# Patient Record
Sex: Male | Born: 1935 | Race: White | Hispanic: No | Smoking: Never smoker
Health system: Southern US, Community
[De-identification: ages and names within clinical notes are randomized; demographics above are authoritative.]

## PROBLEM LIST (undated history)

## (undated) DIAGNOSIS — N400 Enlarged prostate without lower urinary tract symptoms: Secondary | ICD-10-CM

## (undated) DIAGNOSIS — M48061 Spinal stenosis, lumbar region without neurogenic claudication: Secondary | ICD-10-CM

## (undated) DIAGNOSIS — N183 Chronic kidney disease, stage 3 (moderate): Secondary | ICD-10-CM

## (undated) DIAGNOSIS — I359 Nonrheumatic aortic valve disorder, unspecified: Secondary | ICD-10-CM

## (undated) DIAGNOSIS — I5032 Chronic diastolic (congestive) heart failure: Secondary | ICD-10-CM

## (undated) DIAGNOSIS — I872 Venous insufficiency (chronic) (peripheral): Secondary | ICD-10-CM

## (undated) DIAGNOSIS — M519 Unspecified thoracic, thoracolumbar and lumbosacral intervertebral disc disorder: Secondary | ICD-10-CM

## (undated) DIAGNOSIS — F329 Major depressive disorder, single episode, unspecified: Secondary | ICD-10-CM

## (undated) DIAGNOSIS — I639 Cerebral infarction, unspecified: Secondary | ICD-10-CM

## (undated) DIAGNOSIS — K219 Gastro-esophageal reflux disease without esophagitis: Secondary | ICD-10-CM

## (undated) DIAGNOSIS — G459 Transient cerebral ischemic attack, unspecified: Secondary | ICD-10-CM

## (undated) DIAGNOSIS — I2510 Atherosclerotic heart disease of native coronary artery without angina pectoris: Secondary | ICD-10-CM

## (undated) DIAGNOSIS — Z8679 Personal history of other diseases of the circulatory system: Secondary | ICD-10-CM

## (undated) DIAGNOSIS — E669 Obesity, unspecified: Secondary | ICD-10-CM

## (undated) DIAGNOSIS — Z8739 Personal history of other diseases of the musculoskeletal system and connective tissue: Secondary | ICD-10-CM

## (undated) DIAGNOSIS — G4733 Obstructive sleep apnea (adult) (pediatric): Secondary | ICD-10-CM

## (undated) DIAGNOSIS — J9601 Acute respiratory failure with hypoxia: Secondary | ICD-10-CM

## (undated) DIAGNOSIS — I119 Hypertensive heart disease without heart failure: Secondary | ICD-10-CM

## (undated) DIAGNOSIS — Z789 Other specified health status: Secondary | ICD-10-CM

## (undated) DIAGNOSIS — G2581 Restless legs syndrome: Secondary | ICD-10-CM

## (undated) DIAGNOSIS — F32A Depression, unspecified: Secondary | ICD-10-CM

## (undated) DIAGNOSIS — I779 Disorder of arteries and arterioles, unspecified: Secondary | ICD-10-CM

## (undated) DIAGNOSIS — J189 Pneumonia, unspecified organism: Secondary | ICD-10-CM

## (undated) DIAGNOSIS — F419 Anxiety disorder, unspecified: Secondary | ICD-10-CM

## (undated) DIAGNOSIS — I739 Peripheral vascular disease, unspecified: Secondary | ICD-10-CM

## (undated) DIAGNOSIS — I219 Acute myocardial infarction, unspecified: Secondary | ICD-10-CM

## (undated) DIAGNOSIS — Z952 Presence of prosthetic heart valve: Secondary | ICD-10-CM

## (undated) DIAGNOSIS — I447 Left bundle-branch block, unspecified: Secondary | ICD-10-CM

## (undated) DIAGNOSIS — E1159 Type 2 diabetes mellitus with other circulatory complications: Secondary | ICD-10-CM

## (undated) DIAGNOSIS — M1990 Unspecified osteoarthritis, unspecified site: Secondary | ICD-10-CM

## (undated) DIAGNOSIS — Z9989 Dependence on other enabling machines and devices: Secondary | ICD-10-CM

## (undated) HISTORY — DX: Spinal stenosis, lumbar region without neurogenic claudication: M48.061

## (undated) HISTORY — DX: Cerebral infarction, unspecified: I63.9

## (undated) HISTORY — DX: Benign prostatic hyperplasia without lower urinary tract symptoms: N40.0

## (undated) HISTORY — DX: Chronic diastolic (congestive) heart failure: I50.32

## (undated) HISTORY — PX: RETINAL DETACHMENT SURGERY: SHX105

## (undated) HISTORY — PX: EYE SURGERY: SHX253

## (undated) HISTORY — PX: CATARACT EXTRACTION W/ INTRAOCULAR LENS IMPLANT: SHX1309

## (undated) HISTORY — PX: CATARACT EXTRACTION W/ INTRAOCULAR LENS  IMPLANT, BILATERAL: SHX1307

## (undated) HISTORY — DX: Left bundle-branch block, unspecified: I44.7

## (undated) HISTORY — DX: Gastro-esophageal reflux disease without esophagitis: K21.9

## (undated) HISTORY — PX: INGUINAL HERNIA REPAIR: SUR1180

## (undated) HISTORY — DX: Venous insufficiency (chronic) (peripheral): I87.2

## (undated) HISTORY — PX: COLONOSCOPY: SHX174

## (undated) HISTORY — DX: Transient cerebral ischemic attack, unspecified: G45.9

## (undated) HISTORY — PX: BACK SURGERY: SHX140

## (undated) HISTORY — PX: CARPAL TUNNEL RELEASE: SHX101

---

## 1969-03-24 HISTORY — PX: SHOULDER SURGERY: SHX246

## 1992-07-24 DIAGNOSIS — H3320 Serous retinal detachment, unspecified eye: Secondary | ICD-10-CM | POA: Insufficient documentation

## 1998-04-01 ENCOUNTER — Ambulatory Visit (HOSPITAL_COMMUNITY): Admission: RE | Admit: 1998-04-01 | Discharge: 1998-04-01 | Payer: Self-pay | Admitting: Family Medicine

## 2000-09-17 ENCOUNTER — Ambulatory Visit (HOSPITAL_COMMUNITY): Admission: RE | Admit: 2000-09-17 | Discharge: 2000-09-17 | Payer: Self-pay | Admitting: Emergency Medicine

## 2001-09-09 ENCOUNTER — Inpatient Hospital Stay (HOSPITAL_COMMUNITY): Admission: EM | Admit: 2001-09-09 | Discharge: 2001-09-11 | Payer: Self-pay | Admitting: Emergency Medicine

## 2001-09-09 ENCOUNTER — Encounter: Payer: Self-pay | Admitting: Emergency Medicine

## 2001-11-21 DIAGNOSIS — I639 Cerebral infarction, unspecified: Secondary | ICD-10-CM

## 2001-11-21 HISTORY — DX: Cerebral infarction, unspecified: I63.9

## 2002-07-24 DIAGNOSIS — I219 Acute myocardial infarction, unspecified: Secondary | ICD-10-CM | POA: Insufficient documentation

## 2002-09-01 ENCOUNTER — Encounter: Admission: RE | Admit: 2002-09-01 | Discharge: 2002-09-01 | Payer: Self-pay | Admitting: Emergency Medicine

## 2002-09-01 ENCOUNTER — Encounter: Payer: Self-pay | Admitting: Emergency Medicine

## 2002-09-10 ENCOUNTER — Encounter: Payer: Self-pay | Admitting: Emergency Medicine

## 2002-09-10 ENCOUNTER — Encounter: Admission: RE | Admit: 2002-09-10 | Discharge: 2002-09-10 | Payer: Self-pay | Admitting: Emergency Medicine

## 2003-01-05 ENCOUNTER — Encounter: Payer: Self-pay | Admitting: Internal Medicine

## 2003-01-05 ENCOUNTER — Encounter: Admission: RE | Admit: 2003-01-05 | Discharge: 2003-01-05 | Payer: Self-pay | Admitting: Internal Medicine

## 2003-02-03 ENCOUNTER — Encounter: Admission: RE | Admit: 2003-02-03 | Discharge: 2003-03-05 | Payer: Self-pay | Admitting: Orthopedic Surgery

## 2003-07-25 DIAGNOSIS — G459 Transient cerebral ischemic attack, unspecified: Secondary | ICD-10-CM

## 2003-07-25 HISTORY — DX: Transient cerebral ischemic attack, unspecified: G45.9

## 2004-10-17 ENCOUNTER — Encounter: Admission: RE | Admit: 2004-10-17 | Discharge: 2004-10-17 | Payer: Self-pay | Admitting: Emergency Medicine

## 2004-11-18 ENCOUNTER — Encounter: Admission: RE | Admit: 2004-11-18 | Discharge: 2004-11-18 | Payer: Self-pay | Admitting: Emergency Medicine

## 2004-11-23 ENCOUNTER — Encounter: Admission: RE | Admit: 2004-11-23 | Discharge: 2004-11-23 | Payer: Self-pay | Admitting: Emergency Medicine

## 2004-11-27 ENCOUNTER — Encounter: Admission: RE | Admit: 2004-11-27 | Discharge: 2004-11-27 | Payer: Self-pay | Admitting: Emergency Medicine

## 2004-12-03 ENCOUNTER — Encounter: Payer: Self-pay | Admitting: Pulmonary Disease

## 2004-12-06 ENCOUNTER — Encounter (INDEPENDENT_AMBULATORY_CARE_PROVIDER_SITE_OTHER): Payer: Self-pay | Admitting: *Deleted

## 2004-12-06 ENCOUNTER — Inpatient Hospital Stay (HOSPITAL_COMMUNITY): Admission: RE | Admit: 2004-12-06 | Discharge: 2004-12-07 | Payer: Self-pay | Admitting: Vascular Surgery

## 2005-01-03 ENCOUNTER — Ambulatory Visit (HOSPITAL_BASED_OUTPATIENT_CLINIC_OR_DEPARTMENT_OTHER): Admission: RE | Admit: 2005-01-03 | Discharge: 2005-01-03 | Payer: Self-pay | Admitting: Emergency Medicine

## 2005-01-03 ENCOUNTER — Ambulatory Visit: Payer: Self-pay | Admitting: Internal Medicine

## 2005-07-24 DIAGNOSIS — Z8679 Personal history of other diseases of the circulatory system: Secondary | ICD-10-CM

## 2005-07-24 HISTORY — DX: Personal history of other diseases of the circulatory system: Z86.79

## 2005-07-24 HISTORY — PX: PERICARDIAL WINDOW: SHX2213

## 2005-12-06 HISTORY — PX: CAROTID ENDARTERECTOMY: SUR193

## 2006-05-25 ENCOUNTER — Encounter: Admission: RE | Admit: 2006-05-25 | Discharge: 2006-05-25 | Payer: Self-pay | Admitting: Emergency Medicine

## 2006-05-28 ENCOUNTER — Encounter (INDEPENDENT_AMBULATORY_CARE_PROVIDER_SITE_OTHER): Payer: Self-pay | Admitting: Cardiology

## 2006-05-28 ENCOUNTER — Inpatient Hospital Stay (HOSPITAL_COMMUNITY): Admission: EM | Admit: 2006-05-28 | Discharge: 2006-06-02 | Payer: Self-pay | Admitting: Emergency Medicine

## 2006-05-29 ENCOUNTER — Encounter (INDEPENDENT_AMBULATORY_CARE_PROVIDER_SITE_OTHER): Payer: Self-pay | Admitting: Specialist

## 2006-05-29 DIAGNOSIS — Z9889 Other specified postprocedural states: Secondary | ICD-10-CM | POA: Insufficient documentation

## 2006-05-31 ENCOUNTER — Ambulatory Visit: Payer: Self-pay | Admitting: Infectious Diseases

## 2006-06-08 ENCOUNTER — Encounter: Admission: RE | Admit: 2006-06-08 | Discharge: 2006-06-08 | Payer: Self-pay | Admitting: Cardiothoracic Surgery

## 2006-06-13 ENCOUNTER — Ambulatory Visit: Payer: Self-pay | Admitting: Infectious Diseases

## 2006-06-20 ENCOUNTER — Ambulatory Visit: Payer: Self-pay | Admitting: Infectious Diseases

## 2006-06-25 ENCOUNTER — Encounter (INDEPENDENT_AMBULATORY_CARE_PROVIDER_SITE_OTHER): Payer: Self-pay | Admitting: Infectious Diseases

## 2006-07-09 ENCOUNTER — Ambulatory Visit: Payer: Self-pay | Admitting: Infectious Diseases

## 2006-07-24 HISTORY — PX: SHOULDER ARTHROSCOPY: SHX128

## 2006-07-30 DIAGNOSIS — H269 Unspecified cataract: Secondary | ICD-10-CM

## 2006-07-30 DIAGNOSIS — Z8673 Personal history of transient ischemic attack (TIA), and cerebral infarction without residual deficits: Secondary | ICD-10-CM | POA: Insufficient documentation

## 2006-08-13 ENCOUNTER — Encounter: Payer: Self-pay | Admitting: Infectious Diseases

## 2006-09-14 DIAGNOSIS — I739 Peripheral vascular disease, unspecified: Secondary | ICD-10-CM

## 2006-09-14 DIAGNOSIS — E785 Hyperlipidemia, unspecified: Secondary | ICD-10-CM | POA: Insufficient documentation

## 2006-09-14 DIAGNOSIS — G2581 Restless legs syndrome: Secondary | ICD-10-CM | POA: Insufficient documentation

## 2006-09-14 DIAGNOSIS — M1990 Unspecified osteoarthritis, unspecified site: Secondary | ICD-10-CM | POA: Insufficient documentation

## 2006-09-14 DIAGNOSIS — I119 Hypertensive heart disease without heart failure: Secondary | ICD-10-CM | POA: Insufficient documentation

## 2007-07-01 ENCOUNTER — Ambulatory Visit: Payer: Self-pay | Admitting: Vascular Surgery

## 2007-09-27 ENCOUNTER — Inpatient Hospital Stay (HOSPITAL_COMMUNITY): Admission: RE | Admit: 2007-09-27 | Discharge: 2007-09-28 | Payer: Self-pay | Admitting: Orthopaedic Surgery

## 2007-11-29 ENCOUNTER — Ambulatory Visit (HOSPITAL_BASED_OUTPATIENT_CLINIC_OR_DEPARTMENT_OTHER): Admission: RE | Admit: 2007-11-29 | Discharge: 2007-11-29 | Payer: Self-pay | Admitting: Orthopedic Surgery

## 2008-09-18 ENCOUNTER — Ambulatory Visit: Payer: Self-pay | Admitting: Vascular Surgery

## 2009-01-21 HISTORY — PX: CORONARY ANGIOPLASTY WITH STENT PLACEMENT: SHX49

## 2009-02-08 ENCOUNTER — Encounter: Admission: RE | Admit: 2009-02-08 | Discharge: 2009-02-08 | Payer: Self-pay | Admitting: Cardiology

## 2009-02-11 ENCOUNTER — Ambulatory Visit: Payer: Self-pay | Admitting: Cardiovascular Disease

## 2009-02-11 ENCOUNTER — Inpatient Hospital Stay (HOSPITAL_COMMUNITY): Admission: RE | Admit: 2009-02-11 | Discharge: 2009-02-12 | Payer: Self-pay | Admitting: Cardiology

## 2009-02-12 ENCOUNTER — Encounter: Payer: Self-pay | Admitting: Internal Medicine

## 2009-02-12 LAB — CONVERTED CEMR LAB
BUN: 27 mg/dL
CO2: 26 meq/L
Chloride: 106 meq/L
Creatinine, Ser: 1.25 mg/dL
Glucose, Bld: 98 mg/dL
MCV: 90.2 fL
Platelets: 150 10*3/uL
Potassium: 5.7 meq/L
RDW: 13.9 %
Sodium: 139 meq/L

## 2009-08-26 ENCOUNTER — Ambulatory Visit: Payer: Self-pay | Admitting: Vascular Surgery

## 2009-08-27 ENCOUNTER — Encounter: Payer: Self-pay | Admitting: Internal Medicine

## 2009-08-27 DIAGNOSIS — E669 Obesity, unspecified: Secondary | ICD-10-CM | POA: Insufficient documentation

## 2009-08-27 DIAGNOSIS — M1A9XX1 Chronic gout, unspecified, with tophus (tophi): Secondary | ICD-10-CM

## 2009-08-30 ENCOUNTER — Ambulatory Visit: Payer: Self-pay | Admitting: Internal Medicine

## 2009-08-30 DIAGNOSIS — K219 Gastro-esophageal reflux disease without esophagitis: Secondary | ICD-10-CM

## 2009-08-30 DIAGNOSIS — Z8673 Personal history of transient ischemic attack (TIA), and cerebral infarction without residual deficits: Secondary | ICD-10-CM

## 2009-08-30 LAB — CONVERTED CEMR LAB
Basophils Absolute: 0 10*3/uL (ref 0.0–0.1)
Basophils Relative: 0.4 % (ref 0.0–3.0)
CO2: 33 meq/L — ABNORMAL HIGH (ref 19–32)
Calcium: 9.5 mg/dL (ref 8.4–10.5)
Chloride: 103 meq/L (ref 96–112)
Eosinophils Absolute: 0.3 10*3/uL (ref 0.0–0.7)
Eosinophils Relative: 3.5 % (ref 0.0–5.0)
Lymphocytes Relative: 17.7 % (ref 12.0–46.0)
Lymphs Abs: 1.5 10*3/uL (ref 0.7–4.0)
Monocytes Absolute: 1 10*3/uL (ref 0.1–1.0)
Neutro Abs: 5.6 10*3/uL (ref 1.4–7.7)
Potassium: 4.7 meq/L (ref 3.5–5.1)
RBC: 4.88 M/uL (ref 4.22–5.81)
Sodium: 142 meq/L (ref 135–145)

## 2009-09-07 ENCOUNTER — Encounter: Payer: Self-pay | Admitting: Internal Medicine

## 2009-09-16 ENCOUNTER — Ambulatory Visit: Payer: Self-pay | Admitting: Internal Medicine

## 2009-09-23 ENCOUNTER — Telehealth: Payer: Self-pay | Admitting: Internal Medicine

## 2009-09-27 ENCOUNTER — Telehealth: Payer: Self-pay | Admitting: Internal Medicine

## 2009-09-29 ENCOUNTER — Ambulatory Visit: Payer: Self-pay | Admitting: Internal Medicine

## 2009-09-29 DIAGNOSIS — N3941 Urge incontinence: Secondary | ICD-10-CM

## 2009-10-05 ENCOUNTER — Ambulatory Visit: Payer: Self-pay | Admitting: Internal Medicine

## 2009-11-01 ENCOUNTER — Ambulatory Visit: Payer: Self-pay | Admitting: Internal Medicine

## 2009-11-01 LAB — CONVERTED CEMR LAB
AST: 32 units/L (ref 0–37)
Albumin: 4.1 g/dL (ref 3.5–5.2)
Alkaline Phosphatase: 59 units/L (ref 39–117)
BUN: 43 mg/dL — ABNORMAL HIGH (ref 6–23)
Basophils Relative: 0.4 % (ref 0.0–3.0)
CO2: 30 meq/L (ref 19–32)
Calcium: 9.5 mg/dL (ref 8.4–10.5)
Cholesterol: 224 mg/dL — ABNORMAL HIGH (ref 0–200)
Creatinine, Ser: 1.5 mg/dL (ref 0.4–1.5)
Direct LDL: 141.5 mg/dL
Eosinophils Relative: 3.5 % (ref 0.0–5.0)
Hemoglobin: 15 g/dL (ref 13.0–17.0)
Lymphocytes Relative: 28 % (ref 12.0–46.0)
Monocytes Relative: 10.7 % (ref 3.0–12.0)
Neutro Abs: 3.3 10*3/uL (ref 1.4–7.7)
RBC: 4.98 M/uL (ref 4.22–5.81)
Total Protein: 7.7 g/dL (ref 6.0–8.3)
Triglycerides: 249 mg/dL — ABNORMAL HIGH (ref 0.0–149.0)
WBC: 5.7 10*3/uL (ref 4.5–10.5)

## 2009-11-02 ENCOUNTER — Encounter (INDEPENDENT_AMBULATORY_CARE_PROVIDER_SITE_OTHER): Payer: Self-pay | Admitting: *Deleted

## 2009-11-15 ENCOUNTER — Inpatient Hospital Stay (HOSPITAL_COMMUNITY): Admission: EM | Admit: 2009-11-15 | Discharge: 2009-11-16 | Payer: Self-pay | Admitting: Emergency Medicine

## 2009-11-15 ENCOUNTER — Encounter (INDEPENDENT_AMBULATORY_CARE_PROVIDER_SITE_OTHER): Payer: Self-pay | Admitting: Internal Medicine

## 2009-11-16 LAB — CONVERTED CEMR LAB
BUN: 23 mg/dL
CO2: 23 meq/L
Calcium: 9.3 mg/dL
Calcium: 9.3 mg/dL
Chloride: 108 meq/L
Eosinophils Relative: 2 %
HCT: 44.1 %
Lymphocytes, automated: 30 %
MCV: 89.5 fL
Monocytes Relative: 13 %
Neutrophils Relative %: 55 %
Potassium: 5.1 meq/L
RDW: 16.1 %
Sodium: 136 meq/L
Sodium: 137 meq/L

## 2009-11-17 ENCOUNTER — Ambulatory Visit: Payer: Self-pay | Admitting: Internal Medicine

## 2009-11-24 ENCOUNTER — Ambulatory Visit: Payer: Self-pay | Admitting: Internal Medicine

## 2009-11-24 LAB — CONVERTED CEMR LAB
AST: 48 units/L — ABNORMAL HIGH (ref 0–37)
Albumin: 3.9 g/dL (ref 3.5–5.2)
Alkaline Phosphatase: 67 units/L (ref 39–117)
BUN: 29 mg/dL — ABNORMAL HIGH (ref 6–23)
Basophils Absolute: 0 10*3/uL (ref 0.0–0.1)
Bilirubin Urine: NEGATIVE
CO2: 30 meq/L (ref 19–32)
Chloride: 104 meq/L (ref 96–112)
Eosinophils Relative: 9 % — ABNORMAL HIGH (ref 0.0–5.0)
GFR calc non Af Amer: 57.23 mL/min (ref >60)
Glucose, Bld: 129 mg/dL — ABNORMAL HIGH (ref 70–99)
HCT: 43.2 % (ref 39.0–52.0)
Hemoglobin: 14.9 g/dL (ref 13.0–17.0)
Ketones, ur: NEGATIVE mg/dL
Lymphs Abs: 1.1 10*3/uL (ref 0.7–4.0)
MCV: 88.6 fL (ref 78.0–100.0)
Monocytes Absolute: 0.5 10*3/uL (ref 0.1–1.0)
Monocytes Relative: 9.9 % (ref 3.0–12.0)
Neutro Abs: 3.2 10*3/uL (ref 1.4–7.7)
Platelets: 148 10*3/uL — ABNORMAL LOW (ref 150.0–400.0)
Potassium: 4.5 meq/L (ref 3.5–5.1)
RDW: 16.3 % — ABNORMAL HIGH (ref 11.5–14.6)
Sed Rate: 9 mm/hr (ref 0–22)
Sodium: 142 meq/L (ref 135–145)
Total Protein: 6.6 g/dL (ref 6.0–8.3)
Urine Glucose: NEGATIVE mg/dL
Urobilinogen, UA: 0.2 (ref 0.0–1.0)

## 2009-11-30 ENCOUNTER — Encounter (INDEPENDENT_AMBULATORY_CARE_PROVIDER_SITE_OTHER): Payer: Self-pay | Admitting: *Deleted

## 2009-11-30 ENCOUNTER — Ambulatory Visit: Payer: Self-pay | Admitting: Gastroenterology

## 2009-11-30 LAB — CONVERTED CEMR LAB: Vitamin B-12: 1183 pg/mL — ABNORMAL HIGH (ref 211–911)

## 2009-12-10 ENCOUNTER — Ambulatory Visit: Payer: Self-pay | Admitting: Gastroenterology

## 2009-12-10 DIAGNOSIS — K2970 Gastritis, unspecified, without bleeding: Secondary | ICD-10-CM | POA: Insufficient documentation

## 2009-12-10 DIAGNOSIS — K2990 Gastroduodenitis, unspecified, without bleeding: Secondary | ICD-10-CM

## 2009-12-10 LAB — CONVERTED CEMR LAB: UREASE: NEGATIVE

## 2009-12-24 ENCOUNTER — Telehealth: Payer: Self-pay | Admitting: Endocrinology

## 2009-12-24 ENCOUNTER — Encounter: Payer: Self-pay | Admitting: Internal Medicine

## 2010-01-05 ENCOUNTER — Ambulatory Visit: Payer: Self-pay | Admitting: Internal Medicine

## 2010-01-07 ENCOUNTER — Telehealth: Payer: Self-pay | Admitting: Internal Medicine

## 2010-01-27 ENCOUNTER — Ambulatory Visit: Payer: Self-pay | Admitting: Internal Medicine

## 2010-04-11 ENCOUNTER — Encounter: Admission: RE | Admit: 2010-04-11 | Discharge: 2010-04-11 | Payer: Self-pay | Admitting: Cardiology

## 2010-04-13 ENCOUNTER — Ambulatory Visit: Payer: Self-pay | Admitting: Cardiology

## 2010-04-13 ENCOUNTER — Encounter: Payer: Self-pay | Admitting: Internal Medicine

## 2010-04-13 LAB — CONVERTED CEMR LAB
Albumin: 3.8 g/dL
Alkaline Phosphatase: 68 units/L
BUN: 27 mg/dL
CO2: 27 meq/L
Chloride: 103 meq/L
Cholesterol: 187 mg/dL
Creatinine, Ser: 2.12 mg/dL
HCT: 44.4 %
HDL: 41 mg/dL
Hemoglobin: 14.8 g/dL
Neutrophils Relative %: 92 %
Platelets: 162 10*3/uL
RDW: 14.9 %
Sodium: 140 meq/L
Total Protein: 7.3 g/dL
Triglyceride fasting, serum: 154 mg/dL
WBC: 8.1 10*3/uL

## 2010-04-14 ENCOUNTER — Encounter (INDEPENDENT_AMBULATORY_CARE_PROVIDER_SITE_OTHER): Payer: Self-pay | Admitting: Cardiology

## 2010-04-14 ENCOUNTER — Inpatient Hospital Stay (HOSPITAL_COMMUNITY): Admission: EM | Admit: 2010-04-14 | Discharge: 2010-04-17 | Payer: Self-pay | Admitting: Emergency Medicine

## 2010-04-14 ENCOUNTER — Ambulatory Visit: Payer: Self-pay | Admitting: Cardiology

## 2010-04-15 ENCOUNTER — Encounter: Payer: Self-pay | Admitting: Internal Medicine

## 2010-04-16 ENCOUNTER — Encounter: Payer: Self-pay | Admitting: Internal Medicine

## 2010-04-16 LAB — CONVERTED CEMR LAB
CO2: 31 meq/L
Calcium: 8.9 mg/dL
Chloride: 104 meq/L
Creatinine, Ser: 1.24 mg/dL
Hgb A1c MFr Bld: 6.2 %
MCV: 95.3 fL
Potassium: 4.8 meq/L
RDW: 15.4 %
Sodium: 140 meq/L

## 2010-04-17 ENCOUNTER — Encounter: Payer: Self-pay | Admitting: Internal Medicine

## 2010-04-17 LAB — CONVERTED CEMR LAB
Creatinine, Ser: 1.18 mg/dL
Glucose, Bld: 118 mg/dL
Potassium: 3.9 meq/L
Sodium: 137 meq/L

## 2010-04-18 ENCOUNTER — Encounter: Payer: Self-pay | Admitting: Internal Medicine

## 2010-04-19 ENCOUNTER — Ambulatory Visit: Payer: Self-pay | Admitting: Internal Medicine

## 2010-04-19 DIAGNOSIS — E1159 Type 2 diabetes mellitus with other circulatory complications: Secondary | ICD-10-CM | POA: Insufficient documentation

## 2010-05-06 ENCOUNTER — Ambulatory Visit: Payer: Self-pay | Admitting: Internal Medicine

## 2010-05-06 DIAGNOSIS — I509 Heart failure, unspecified: Secondary | ICD-10-CM | POA: Insufficient documentation

## 2010-05-06 LAB — CONVERTED CEMR LAB
BUN: 26 mg/dL — ABNORMAL HIGH (ref 6–23)
Basophils Absolute: 0 10*3/uL (ref 0.0–0.1)
Basophils Relative: 0.3 % (ref 0.0–3.0)
Calcium: 9.7 mg/dL (ref 8.4–10.5)
Chloride: 103 meq/L (ref 96–112)
Creatinine, Ser: 1.5 mg/dL (ref 0.4–1.5)
Eosinophils Absolute: 0.4 10*3/uL (ref 0.0–0.7)
GFR calc non Af Amer: 47.72 mL/min (ref >60)
Hemoglobin: 14.6 g/dL (ref 13.0–17.0)
Lymphocytes Relative: 34 % (ref 12.0–46.0)
Lymphs Abs: 2.2 10*3/uL (ref 0.7–4.0)
MCHC: 33.6 g/dL (ref 30.0–36.0)
MCV: 93 fL (ref 78.0–100.0)
Monocytes Absolute: 0.9 10*3/uL (ref 0.1–1.0)
Neutro Abs: 3 10*3/uL (ref 1.4–7.7)
Pro B Natriuretic peptide (BNP): 83.6 pg/mL (ref 0.0–100.0)
RDW: 15.9 % — ABNORMAL HIGH (ref 11.5–14.6)

## 2010-05-09 ENCOUNTER — Ambulatory Visit: Payer: Self-pay | Admitting: Internal Medicine

## 2010-05-11 ENCOUNTER — Ambulatory Visit: Payer: Self-pay | Admitting: Internal Medicine

## 2010-05-11 ENCOUNTER — Telehealth: Payer: Self-pay | Admitting: Internal Medicine

## 2010-05-16 ENCOUNTER — Ambulatory Visit: Payer: Self-pay | Admitting: Internal Medicine

## 2010-05-20 ENCOUNTER — Encounter: Payer: Self-pay | Admitting: Internal Medicine

## 2010-05-23 ENCOUNTER — Telehealth (INDEPENDENT_AMBULATORY_CARE_PROVIDER_SITE_OTHER): Payer: Self-pay | Admitting: *Deleted

## 2010-05-24 ENCOUNTER — Encounter: Payer: Self-pay | Admitting: Internal Medicine

## 2010-06-09 ENCOUNTER — Encounter (HOSPITAL_COMMUNITY)
Admission: RE | Admit: 2010-06-09 | Discharge: 2010-07-23 | Payer: Self-pay | Source: Home / Self Care | Attending: Cardiology | Admitting: Cardiology

## 2010-06-20 ENCOUNTER — Encounter: Payer: Self-pay | Admitting: Internal Medicine

## 2010-06-27 ENCOUNTER — Ambulatory Visit: Payer: Self-pay | Admitting: Internal Medicine

## 2010-06-27 DIAGNOSIS — F411 Generalized anxiety disorder: Secondary | ICD-10-CM

## 2010-07-07 ENCOUNTER — Telehealth: Payer: Self-pay | Admitting: Internal Medicine

## 2010-07-21 ENCOUNTER — Telehealth: Payer: Self-pay | Admitting: Internal Medicine

## 2010-07-26 ENCOUNTER — Encounter: Payer: Self-pay | Admitting: Internal Medicine

## 2010-08-08 ENCOUNTER — Ambulatory Visit
Admission: RE | Admit: 2010-08-08 | Discharge: 2010-08-08 | Payer: Self-pay | Source: Home / Self Care | Attending: Internal Medicine | Admitting: Internal Medicine

## 2010-08-08 ENCOUNTER — Encounter: Payer: Self-pay | Admitting: Internal Medicine

## 2010-08-08 ENCOUNTER — Telehealth: Payer: Self-pay | Admitting: Internal Medicine

## 2010-08-08 DIAGNOSIS — G4700 Insomnia, unspecified: Secondary | ICD-10-CM | POA: Insufficient documentation

## 2010-08-08 DIAGNOSIS — F329 Major depressive disorder, single episode, unspecified: Secondary | ICD-10-CM | POA: Insufficient documentation

## 2010-08-08 DIAGNOSIS — F3289 Other specified depressive episodes: Secondary | ICD-10-CM | POA: Insufficient documentation

## 2010-08-14 ENCOUNTER — Encounter: Payer: Self-pay | Admitting: Infectious Diseases

## 2010-08-21 LAB — CONVERTED CEMR LAB
ALT: 15 units/L
AST: 25 units/L
Alkaline Phosphatase: 86 units/L
BUN: 78 mg/dL
Calcium: 9.9 mg/dL
Glucose, Bld: 139 mg/dL
Lymphocytes, automated: 16 %
MCV: 93.1 fL
Monocytes Relative: 11 %
Neutrophils Relative %: 71 %
Platelets: 226 10*3/uL
Potassium: 4.1 meq/L
Sodium: 139 meq/L
Total Protein: 7.7 g/dL
WBC: 11.1 10*3/uL

## 2010-08-23 ENCOUNTER — Ambulatory Visit
Admission: RE | Admit: 2010-08-23 | Discharge: 2010-08-23 | Payer: Self-pay | Source: Home / Self Care | Attending: Pulmonary Disease | Admitting: Pulmonary Disease

## 2010-08-23 DIAGNOSIS — G4733 Obstructive sleep apnea (adult) (pediatric): Secondary | ICD-10-CM | POA: Insufficient documentation

## 2010-08-23 NOTE — Assessment & Plan Note (Signed)
Summary: NEW / BLUE MEDICARE / # / CD   Vital Signs:  Patient profile:   77 year old male Height:      67 inches (170.18 cm) Weight:      249.6 pounds (113.45 kg) BMI:     39.23 O2 Sat:      97 % on Room air Temp:     97.4 degrees F (36.33 degrees C) oral Pulse rate:   71 / minute BP sitting:   148 / 68  (left arm) Cuff size:   large  Vitals Entered By: Orlan Leavens (August 30, 2009 9:46 AM)  O2 Flow:  Room air CC: New patient Is Patient Diabetic? No Pain Assessment Patient in pain? yes     Location: feet Type: gout flare-up   Primary Care Provider:  Newt Lukes MD  CC:  New patient.  History of Present Illness: new pt to me - here to est care   1) concerned about red lump on top of left foot onset of redness was 4 days ago reports the "lump of bone" was already at this site but prior to 4 d ago was not swollen, red or uncomfortable denies a/w fever -no flares of gout and not like prior gout symptoms  denies any trauma or injury to this area no drainage at any time from this lump no other joints or skin affected  2) htn reports compliance with ongoing medical treatment and no changes in medication dose or frequency. denies adverse side effects related to current therapy.   3) dyslipidemia intol of lipitor in 2003 but reports compliance with ongoing medical treatment and no changes in medication dose or frequency. denies adverse side effects related to current therapy.   4) gout with tophi follows with rheum for same. no recent flares or swelling. no med changes  Preventive Screening-Counseling & Management  Alcohol-Tobacco     Alcohol drinks/day: 0     Smoking Status: never  Safety-Violence-Falls     Firearms in the Home: firearms in the home     Firearm Counseling: not indicated; uses recommended firearm safety measures     Smoke Detectors: yes     Violence in the Home: no risk noted     Fall Risk Counseling: not indicated; no significant falls  noted  Clinical Review Panels:  Immunizations   Last Tetanus Booster:  Historical (07/25/2003)   Last Pneumovax:  Historical (07/24/2005)  CBC   WBC:  6.1 (02/12/2009)   RBC:  4.82 (02/12/2009)   Hgb:  14.7 (02/12/2009)   Hct:  43.4 (02/12/2009)   Platelets:  150 (02/12/2009)   MCV  90.2 (02/12/2009)   RDW  13.9 (02/12/2009)  Complete Metabolic Panel   Glucose:  98 (02/12/2009)   Sodium:  139 (02/12/2009)   Potassium:  5.7 (02/12/2009)   Chloride:  106 (02/12/2009)   CO2:  26 (02/12/2009)   BUN:  27 (02/12/2009)   Creatinine:  1.25 (02/12/2009)   Calcium:  9.3 (02/12/2009)   Current Medications (verified): 1)  Metoprolol Tartrate 50 Mg Tabs (Metoprolol Tartrate) .... Take 1 Tablet By Mouth Two Times A Day 2)  Lorazepam  Tabs (Lorazepam Tabs) .... Take 1 Tablet By Mouth Two Times A Day 3)  Requip 4 Mg Tabs (Ropinirole Hydrochloride) .... Take 1 Tablet By Mouth Once A Day 4)  Remeron 15 Mg Tabs (Mirtazapine) .... At Bedtime 5)  Colchicine 0.6 Mg Tabs (Colchicine) .... Take 1 Tablet By Mouth Two Times A Day 6)  Aspirin 81 Mg Tbec (Aspirin) .... Once Daily 7)  Plavix 75 Mg Tabs (Clopidogrel Bisulfate) .... Take 1 By Mouth Qd 8)  Furosemide 20 Mg Tabs (Furosemide) .... Take 1 Two Times A Day 9)  Gabapentin 100 Mg Caps (Gabapentin) .... Take 1 Two Times A Day 10)  Coreg 12.5 Mg Tabs (Carvedilol) .... Take 1 Two Times A Day 11)  Allopurinol 100 Mg Tabs (Allopurinol) .... Take 1 Two Times A Day 12)  Lisinopril 40 Mg Tabs (Lisinopril) .... Take 1 By Mouth Qd 13)  Flurazepam Hcl 30 Mg Caps (Flurazepam Hcl) .... Take 1 By Mouth Qd 14)  Simvastatin 40 Mg Tabs (Simvastatin) .... Take 1 By Mouth Qd 15)  Vitamin D3 1000 Unit Tabs (Cholecalciferol) .... Take 1 By Mouth Qd  Allergies (verified): No Known Drug Allergies  Past History:  Past Medical History: Pericarditis MSSA s/ p pericardia window 2007 Hypertension Hyperlipidemia Arthritis-gouty Degenerative disc disease History  of Transient Ischemic attack-2005 History of Left Carotid Endarterectomy Myocardial Infarction-2004 Mild sleep apnea Restless Leg Syndrome Retinal detachment-1994 Cataracts Coronary artery disease GERD Urinary incontinence  MD rooster: cards- [facility] - early rheum- deveshwrar  Past Surgical History: PTCA/stent (01/2009) pericardial window (2007) Nerve in arm (2008)  Family History: Family History Diabetes 1st degree relative (parent) Family History of Prostate CA 1st degree relative <50 (other relative) Heart disease (other relative)  Social History: Never Smoked lives with wife (who is paraplegic) and provides her 24/7 care with asst of dtrsSmoking Status:  never  Review of Systems       see HPI above. I have reviewed all other systems and they were negative.   Physical Exam  General:  alert, well-developed, well-nourished, and cooperative to examination.    Eyes:  vision grossly intact; pupils equal, round and reactive to light.  conjunctiva and lids normal.    Neck:  thick supple, full ROM, no masses, no thyromegaly; no thyroid nodules or tenderness. no JVD or carotid bruits.   Lungs:  normal respiratory effort, no intercostal retractions or use of accessory muscles; normal breath sounds bilaterally - no crackles and no wheezes.    Heart:  normal rate, regular rhythm, no murmur, and no rub. BLE with 1+ edema. L>R, normal DP pulses and normal cap refill in all 4 extremities    Abdomen:  protuberant , soft, non-tender, normal bowel sounds, no distention; no masses and no appreciable hepatomegaly or splenomegaly.   Msk:  tophi changes at bilateral knees R>L, no gross defomities or effusions Neurologic:  alert & oriented X3 and cranial nerves II-XII symetrically intact.  strength normal in all extremities, sensation intact to light touch, and gait normal. speech fluent without dysarthria or aphasia; follows commands with good comprehension.  Skin:  1" nodular tophi with  3" surrounding erythema on dorsum of left foot over medial side between 1-2 tarsal region. other wise no rashes, vesicles, ulcers, or erythema. Psych:  Oriented X3, memory intact for recent and remote, normally interactive, good eye contact, not anxious appearing, not depressed appearing, and not agitated.      Impression & Recommendations:  Problem # 1:  CELLULITIS, FOOT, LEFT (ICD-682.7)  will not i&d given hx tophi in this location prior to onset of redness - but check xray now and start kelfex  pt instructed to cal for reval if increasing redness, fever, swelling or other problems despite abx - cont other chroinc meds for gout as ongoing -  Elevate affected area. Warm moist compresses for 20 minutes  every 2 hours while awake. Take antibiotics as directed and take acetaminophen as needed. To be seen in 48-72 hours if no improvement, sooner if worse.  His updated medication list for this problem includes:    Keflex 500 Mg Caps (Cephalexin) .Marland Kitchen... 1 by mouth three times a day x 7 days  Orders: T-Foot Left Min 3 Views (73630TC) TLB-CBC Platelet - w/Differential (85025-CBCD) Prescription Created Electronically [phone])  Problem # 2:  GOUTY TOPHI (ICD-274.82)  His updated medication list for this problem includes:    Colchicine 0.6 Mg Tabs (Colchicine) .Marland Kitchen... Take 1 tablet by mouth two times a day    Allopurinol 100 Mg Tabs (Allopurinol) .Marland Kitchen... Take 1 two times a day  Elevate extremity; warm compresses, symptomatic relief and medication as directed.   Orders: T-Foot Left Min 3 Views [phone])  Problem # 3:  CORONARY ARTERY DISEASE (ICD-414.00) DES stent to cx done 01/2009- hosp records reviewed no current symptoms of angina cont med mgmt as ongoing per [facility]ley The following medications were removed from the medication list:    Doxazosin Mesylate 2 Mg Tabs (Doxazosin mesylate) ..... Once daily    Bayer Aspirin 325 Mg Tabs (Aspirin) .Marland Kitchen... Take 1 tablet by mouth once a day His updated  medication list for this problem includes:    Metoprolol Tartrate 50 Mg Tabs (Metoprolol tartrate) .Marland Kitchen... Take 1 tablet by mouth two times a day    Aspirin 81 Mg Tbec (Aspirin) ..... Once daily    Plavix 75 Mg Tabs (Clopidogrel bisulfate) .Marland Kitchen... Take 1 by mouth qd    Furosemide 20 Mg Tabs (Furosemide) .Marland Kitchen... Take 1 two times a day    Coreg 12.5 Mg Tabs (Carvedilol) .Marland Kitchen... Take 1 two times a day    Lisinopril 40 Mg Tabs (Lisinopril) .Marland Kitchen... Take 1 by mouth qd  Problem # 4:  HYPERTENSION (ICD-401.9)  The following medications were removed from the medication list:    Doxazosin Mesylate 2 Mg Tabs (Doxazosin mesylate) ..... Once daily His updated medication list for this problem includes:    Metoprolol Tartrate 50 Mg Tabs (Metoprolol tartrate) .Marland Kitchen... Take 1 tablet by mouth two times a day    Furosemide 20 Mg Tabs (Furosemide) .Marland Kitchen... Take 1 two times a day    Coreg 12.5 Mg Tabs (Carvedilol) .Marland Kitchen... Take 1 two times a day    Lisinopril 40 Mg Tabs (Lisinopril) .Marland Kitchen... Take 1 by mouth qd  Orders: TLB-BMP (Basic Metabolic Panel-BMET) (80048-METABOL)  BP today: 148/68  Labs Reviewed: K+: 5.7 (02/12/2009) Creat: : 1.25 (02/12/2009)     Problem # 5:  HYPERLIPIDEMIA (ICD-272.4) will plan to check flp and lfts next OV when fasting His updated medication list for this problem includes:    Simvastatin 40 Mg Tabs (Simvastatin) .Marland Kitchen... Take 1 by mouth qd  Complete Medication List: 1)  Metoprolol Tartrate 50 Mg Tabs (Metoprolol tartrate) .... Take 1 tablet by mouth two times a day 2)  Lorazepam Tabs (Lorazepam tabs) .... Take 1 tablet by mouth two times a day 3)  Requip 4 Mg Tabs (Ropinirole hydrochloride) .... Take 1 tablet by mouth once a day 4)  Remeron 15 Mg Tabs (Mirtazapine) .... At bedtime 5)  Colchicine 0.6 Mg Tabs (Colchicine) .... Take 1 tablet by mouth two times a day 6)  Aspirin 81 Mg Tbec (Aspirin) .... Once daily 7)  Plavix 75 Mg Tabs (Clopidogrel bisulfate) .... Take 1 by mouth qd 8)   Furosemide 20 Mg Tabs (Furosemide) .... Take 1 two times a day 9)  Gabapentin 100 Mg Caps (Gabapentin) .... Take 1 two times a day 10)  Coreg 12.5 Mg Tabs (Carvedilol) .... Take 1 two times a day 11)  Allopurinol 100 Mg Tabs (Allopurinol) .... Take 1 two times a day 12)  Lisinopril 40 Mg Tabs (Lisinopril) .... Take 1 by mouth qd 13)  Flurazepam Hcl 30 Mg Caps (Flurazepam hcl) .... Take 1 by mouth qd 14)  Simvastatin 40 Mg Tabs (Simvastatin) .... Take 1 by mouth qd 15)  Vitamin D3 1000 Unit Tabs (Cholecalciferol) .... Take 1 by mouth qd 16)  Keflex 500 Mg Caps (Cephalexin) .Marland Kitchen.. 1 by mouth three times a day x 7 days  Patient Instructions: 1)  it was good to see you today.  2)  will start antibiotics for your foot redness to cover possible infection - if increasing redness, swelling or any fever despite these antibiotics, call for recheck or other antibiotics 3)  test(s) ordered today - your results will be posted on the phone tree for review in 48-72 hours from the time of test completion; call [phone] and enter your 9 digit MRN (listed above on this page, just below your name); if any changes need to be made or there are abnormal results, you will be contacted directly.  4)  no other medication changes today - keep doing what you aare doing - 5)  Please schedule a follow-up appointment in 3 months, sooner if problems.  Prescriptions: KEFLEX 500 MG CAPS (CEPHALEXIN) 1 by mouth three times a day x 7 days  #21 x 0   Entered and Authorized by:   Newt Lukes MD   Signed by:   Newt Lukes MD on 08/30/2009   Method used:   Electronically to        [facility] (retail)       [address]       [place], [place]  [address]       Ph: [phone]       Fax: [phone]   RxID:   [id number]    Immunization History:  Tetanus/Td Immunization History:    Tetanus/Td:  historical (07/25/2003)  Pneumovax Immunization History:    Pneumovax:   historical (07/24/2005)

## 2010-08-23 NOTE — Assessment & Plan Note (Signed)
Summary: rash all over body/cd   Vital Signs:  Patient profile:   77 year old male Height:      67 inches (170.18 cm) Weight:      227.8 pounds (103.55 kg) O2 Sat:      96 % on Room air Temp:     97.6 degrees F (36.44 degrees C) oral Pulse rate:   96 / minute BP sitting:   140 / 78  (left arm) Cuff size:   large  Vitals Entered By: Orlan Leavens (Nov 24, 2009 10:37 AM)  O2 Flow:  Room air CC: rash not better, spreading on arms and thigh area Is Patient Diabetic? No Pain Assessment Patient in pain? no        Primary Care Provider:  Newt Lukes MD  CC:  rash not better and spreading on arms and thigh area.  History of Present Illness: 1) here for f/u rash - seen for same 4/27 - thought due to allopurinol so med was stopped - also given steroid shot and taking benadryl- seemed improved for 2-3 days but then recurrence of symptoms; never with complete resolution- still assoc with severe itch - spreading onset 10 days ago- affects all distal extremites, new involvment on trunk but not face no peeling - no vesicles or blisters no outdoor exposures or travel - no fever not improved with use of benadryl - itch symptoms worse with hot shower and heat outside  2) UTI hosp 2 weeks ago - has completed abx for uti - would like urine check to be sure infx gone no dysuria or hematuria -  3) HTN - still holding lisinopril - reports compliance with ongoing medical treatment and no changes in medication dose or frequency. denies adverse side effects related to current therapy.   Current Medications (verified): 1)  Metoprolol Tartrate 50 Mg Tabs (Metoprolol Tartrate) .... Take 1 Tablet By Mouth Two Times A Day 2)  Requip 4 Mg Tabs (Ropinirole Hydrochloride) .... Take 1 Tablet By Mouth Once A Day 3)  Remeron 15 Mg Tabs (Mirtazapine) .... At Bedtime 4)  Colcrys 0.6 Mg Tabs (Colchicine) .Marland Kitchen.. 1 By Mouth Two Times A Day 5)  Aspirin 81 Mg Tbec (Aspirin) .... Once Daily 6)  Plavix  75 Mg Tabs (Clopidogrel Bisulfate) .... Take 1 By Mouth Qd 7)  Furosemide 20 Mg Tabs (Furosemide) .... Take 1 Two Times A Day 8)  Coreg 12.5 Mg Tabs (Carvedilol) .... Take 1 Two Times A Day 9)  Lisinopril 40 Mg Tabs (Lisinopril) .... Stop Until Potassium Recheck 10)  Flurazepam Hcl 30 Mg Caps (Flurazepam Hcl) .... Take 1 By Mouth Qd 11)  Simvastatin 40 Mg Tabs (Simvastatin) .Marland Kitchen.. 1 By Mouth Once Daily 12)  Vitamin D3 1000 Unit Tabs (Cholecalciferol) .... Take 1 By Mouth Qd 13)  Vesicare 10 Mg Tabs (Solifenacin Succinate) .Marland Kitchen.. 1 By Mouth Once Daily 14)  Lorazepam 1 Mg Tabs (Lorazepam) .... Take 1 By Mouth Once Daily For Nerves 15)  Gabapentin 300 Mg Caps (Gabapentin) .Marland Kitchen.. 1 By Mouth Two Times A Day 16)  Diphenhydramine Hcl 25 Mg Caps (Diphenhydramine Hcl) .... Take 1 Q 8 Hours As Needed  Allergies (verified): 1)  ! Lipitor (Atorvastatin Calcium)  Past History:  Past Medical History: Reviewed history from 11/17/2009 and no changes required. Pericarditis MSSA s/ p pericardia window 2007 Hypertension Hyperlipidemia Arthritis-gouty   Degenerative disc disease History of Transient Ischemic attack-2005 History of Left Carotid Endarterectomy Myocardial Infarction-2004 Mild sleep apnea Restless Leg Syndrome Retinal  detachment-1994 Cataracts Coronary artery disease     GERD Urinary incontinence, urge  MD rooster: cards- [facility] - early rheum- s. deveshwar  Review of Systems  The patient denies anorexia, fever, vision loss, and headaches.    Physical Exam  General:  alert, well-developed, well-nourished, and cooperative to examination.  itching constantly Eyes:  vision grossly intact; pupils equal, round and reactive to light.  conjunctiva and lids normal.    Ears:  normal pinnae bilaterally, without erythema, swelling, or tenderness to palpation. TMs clear, without effusion, or cerumen impaction. Hearing grossly normal bilaterally  Mouth:  no mucocutaneous lesions or swelling  or edema Lungs:  normal respiratory effort, no intercostal retractions or use of accessory muscles; normal breath sounds bilaterally - no crackles and no wheezes.    Heart:  normal rate, regular rhythm, no murmur, and no rub. BLE without edema.  Skin:  diffuse confluent maculopapular rash (hives) affecting all 4 distal extremites>proximal but sparing face; also with patch hives involving anterior abd and back - signs of excoriation present Psych:  Oriented X3, memory intact for recent and remote, normally interactive, good eye contact, not anxious appearing, not depressed appearing, and not agitated.      Impression & Recommendations:  Problem # 1:  URTICARIA (ICD-708.9) had pt also examined today by my colleuge dr. Yetta Barre who agrees drug-induced urticaria seems most likely - cont holding lisinopril, hold allopurinol and hold furosemide - check labs change antihistamine and retx steroid shot +lotion - if still unimproved 1 week, pt to call and will refer to derm Orders: TLB-CBC Platelet - w/Differential (85025-CBCD) TLB-Sedimentation Rate (ESR) (85652-ESR) TLB-Hepatic/Liver Function Pnl (80076-HEPATIC) Depo- Medrol 80mg  (J1040) Admin of Therapeutic Inj  intramuscular or subcutaneous (04540)  Problem # 2:  HYPERTENSION (ICD-401.9)  His updated medication list for this problem includes:    Metoprolol Tartrate 50 Mg Tabs (Metoprolol tartrate) .Marland Kitchen... Take 1 tablet by mouth two times a day    Furosemide 20 Mg Tabs (Furosemide) .Marland Kitchen... Take 1 two times a day - hold    Coreg 12.5 Mg Tabs (Carvedilol) .Marland Kitchen... Take 1 two times a day    Lisinopril 40 Mg Tabs (Lisinopril) ..... Stop until potassium recheck  Orders: TLB-BMP (Basic Metabolic Panel-BMET) (80048-METABOL)  BP today: 140/78 Prior BP: 162/82 (11/17/2009)  Labs Reviewed: K+: 5.3 (11/16/2009) Creat: : 1.65 (11/16/2009)   Chol: 224 (11/01/2009)   HDL: 42.20 (11/01/2009)   TG: 249.0 (11/01/2009)  Problem # 3:  UTI (ICD-599.0)  The  following medications were removed from the medication list:    Cefuroxime Axetil 500 Mg Tabs (Cefuroxime axetil) .Marland Kitchen... Take 1 two times a day for 6  days (on hold) His updated medication list for this problem includes:    Vesicare 10 Mg Tabs (Solifenacin succinate) .Marland Kitchen... 1 by mouth once daily  Orders: TLB-Udip w/ Micro (81001-URINE)  recent hosp for same - completed abx as prescribed - assoc AMS is much improved reck now at pt request to prove infx cleared  Complete Medication List: 1)  Metoprolol Tartrate 50 Mg Tabs (Metoprolol tartrate) .... Take 1 tablet by mouth two times a day 2)  Requip 4 Mg Tabs (Ropinirole hydrochloride) .... Take 1 tablet by mouth once a day 3)  Remeron 15 Mg Tabs (Mirtazapine) .... At bedtime 4)  Colcrys 0.6 Mg Tabs (Colchicine) .Marland Kitchen.. 1 by mouth two times a day 5)  Aspirin 81 Mg Tbec (Aspirin) .... Once daily 6)  Plavix 75 Mg Tabs (Clopidogrel bisulfate) .... Take 1 by mouth  once daily 7)  Furosemide 20 Mg Tabs (Furosemide) .... Take 1 two times a day - hold 8)  Coreg 12.5 Mg Tabs (Carvedilol) .... Take 1 two times a day 9)  Lisinopril 40 Mg Tabs (Lisinopril) .... Stop until potassium recheck 10)  Flurazepam Hcl 30 Mg Caps (Flurazepam hcl) .... Take 1 by mouth once daily 11)  Simvastatin 40 Mg Tabs (Simvastatin) .Marland Kitchen.. 1 by mouth once daily 12)  Vitamin D3 1000 Unit Tabs (Cholecalciferol) .... Take 1 by mouth once daily 13)  Vesicare 10 Mg Tabs (Solifenacin succinate) .Marland Kitchen.. 1 by mouth once daily 14)  Lorazepam 1 Mg Tabs (Lorazepam) .... Take 1 by mouth once daily for nerves 15)  Gabapentin 300 Mg Caps (Gabapentin) .Marland Kitchen.. 1 by mouth two times a day 16)  Vistaril 50 Mg Caps (Hydroxyzine pamoate) .Marland Kitchen.. 1 by mouth three times a day x 7 days, then as needed for itch and rash 17)  Triamcinolone Acetonide 0.025 % Lotn (Triamcinolone acetonide) .... Apply to itchy skin three times a day as needed  Patient Instructions: 1)  it was good to see you today. 2)  test(s)  ordered today - your results will be posted on the phone tree for review in 48-72 hours from the time of test completion; call [phone] and enter your 9 digit MRN (listed above on this page, just below your name); if any changes need to be made or there are abnormal results, you will be contacted directly.  3)  continue hlding lisinopril and stay off allopurinol 4)  also hold furosemide (lasix) until further instructions - 5)  change benadryl to vistiril for itch and rash - also use steroid lotion for skin irritation - your prescriptions have been electronically submitted to your pharmacy. Please take as directed. Contact our office if you believe you're having problems with the medication(s).  6)  repeat steroid shot done today 7)  if still not improved in 1 week, call and we will make referral to dermatologist for further evaluation and treatment Prescriptions: TRIAMCINOLONE ACETONIDE 0.025 % LOTN (TRIAMCINOLONE ACETONIDE) apply to itchy skin three times a day as needed  #1 lrg x 1   Entered and Authorized by:   Newt Lukes MD   Signed by:   Newt Lukes MD on 11/24/2009   Method used:   Electronically to        [facility] (retail)       [address]       [place], [place]  [address]       Ph: [phone]       Fax: [phone]   RxID:   [id number] VISTARIL 50 MG CAPS (HYDROXYZINE PAMOATE) 1 by mouth three times a day x 7 days, then as needed for itch and rash  #40 x 1   Entered and Authorized by:   Newt Lukes MD   Signed by:   Newt Lukes MD on 11/24/2009   Method used:   Electronically to        [facility] (retail)       [address]       [place], [place]  [address]       Ph: [phone]       Fax: [phone]   RxID:   [id number]    Medication Administration  Injection # 1:    Medication: Depo- Medrol 80mg     Diagnosis:  URTICARIA (ICD-708.9)    Route: IM    Site: LUOQ  gluteus    Exp Date: 05/2012    Lot #: [id number]    Mfr: [employer]    Patient tolerated injection without complications    Given by: Orlan Leavens (Nov 24, 2009 11:23 AM)  Orders Added: 1)  TLB-Udip w/ Micro [81001-URINE] 2)  TLB-BMP (Basic Metabolic Panel-BMET) [80048-METABOL] 3)  TLB-CBC Platelet - w/Differential [85025-CBCD] 4)  TLB-Sedimentation Rate (ESR) [85652-ESR] 5)  TLB-Hepatic/Liver Function Pnl [80076-HEPATIC] 6)  Est. Patient Level IV [96295] 7)  Depo- Medrol 80mg  [J1040] 8)  Admin of Therapeutic Inj  intramuscular or subcutaneous [28413]

## 2010-08-23 NOTE — Progress Notes (Signed)
  Phone Note Other Incoming   Request: Send information Summary of Call: Records received from [facility]...Dr. Pollyann Savoy. 2 pages forwarded to Dr. Felicity Coyer for review.

## 2010-08-23 NOTE — Progress Notes (Signed)
Summary: shingles vax cost?  Phone Note Call from Patient Call back at Home Phone [phone]   Caller: Patient Summary of Call: Pt is wanting to get shingle injection. will his insurance cover, he has blue medicare Initial call taken by: Orlan Leavens RMA,  May 11, 2010 3:39 PM  Follow-up for Phone Call        [facility] check on charges for shingle injection. Pt will have to pay 107.68 up front. Pt saw md today let pt know status.  Follow-up by: Orlan Leavens RMA,  May 16, 2010 4:17 PM  Additional Follow-up for Phone Call Additional follow up Details #1::        pt will delay shingles vax for now due to cost - will reconsider in 2012 when new insurance in place Additional Follow-up by: Newt Lukes MD,  May 16, 2010 4:44 PM

## 2010-08-23 NOTE — Assessment & Plan Note (Signed)
Summary: SWOLLEN FEET--STC   Vital Signs:  Patient profile:   77 year old male Height:      67 inches (170.18 cm) Weight:      230.8 pounds (104.91 kg) O2 Sat:      96 % on Room air Temp:     98.0 degrees F (36.67 degrees C) oral Pulse rate:   75 / minute BP sitting:   142 / 72  (left arm) Cuff size:   regular  Vitals Entered By: Orlan Leavens (January 27, 2010 10:01 AM)  O2 Flow:  Room air CC: Swollen feet Is Patient Diabetic? No Pain Assessment Patient in pain? yes     Location: feet Type: aching/heaviness Comments Pt states he had to stop taking Colcrys due to cost $700.00 month. Requesting something else   Primary Care Provider:  [name], MD  CC:  Swollen feet.  History of Present Illness: here with swelling both feet -  onset 3 weeks ago - assoc with pain at joint swelling typical of gout symptoms - stopped colcrys 1 week ago due to cost issues - no SOB or cough   Current Medications (verified): 1)  Metoprolol Tartrate 50 Mg Tabs (Metoprolol Tartrate) .... Take 1 Tablet By Mouth Two Times A Day 2)  Requip 4 Mg Tabs (Ropinirole Hydrochloride) .... Take 1 Tablet By Mouth Once A Day 3)  Remeron 15 Mg Tabs (Mirtazapine) .... At Bedtime 4)  Aspirin 81 Mg Tbec (Aspirin) .... Once Daily 5)  Plavix 75 Mg Tabs (Clopidogrel Bisulfate) .... Take 1 By Mouth Once Daily 6)  Furosemide 20 Mg Tabs (Furosemide) .... Take 1 Two Times A Day - Hold 7)  Flurazepam Hcl 30 Mg Caps (Flurazepam Hcl) .... Take 1 By Mouth Once Daily 8)  Simvastatin 40 Mg Tabs (Simvastatin) .Marland Kitchen.. 1 By Mouth Once Daily 9)  Vitamin D3 1000 Unit Tabs (Cholecalciferol) .... Take 1 By Mouth Once Daily 10)  Vesicare 10 Mg Tabs (Solifenacin Succinate) .Marland Kitchen.. 1 By Mouth Once Daily 11)  Lorazepam 1 Mg Tabs (Lorazepam) .... Take 1 By Mouth Once Daily For Nerves 12)  Gabapentin 300 Mg Caps (Gabapentin) .Marland Kitchen.. 1 By Mouth Two Times A Day 13)  Aciphex 20 Mg Tbec (Rabeprazole Sodium) .Marland Kitchen.. 1 By Mouth Once Daily 14)   Allopurinol 300 Mg Tabs (Allopurinol) .Marland Kitchen.. 1 By Mouth Once Daily  Allergies (verified): 1)  ! Lipitor (Atorvastatin Calcium)  Past History:  Past Medical History: Pericarditis MSSA s/ p pericardia window 2007 Hypertension Hyperlipidemia Arthritis-gouty   Degenerative disc disease  History of Transient Ischemic attack-2005 History of Left Carotid Endarterectomy Myocardial Infarction-2004 Mild sleep apnea Restless Leg Syndrome Retinal detachment-1994 Cataracts Coronary artery disease     GERD Urinary incontinence, urge   MD roster: cards- [facility] - early rheum- [facility]. [facility] - [facility]  Review of Systems  The patient denies fever, weight loss, chest pain, and syncope.    Physical Exam  General:  alert, well-developed, well-nourished, and cooperative to examination. mod uncomfortable Lungs:  normal respiratory effort, no intercostal retractions or use of accessory muscles; normal breath sounds bilaterally - no crackles and no wheezes.    Heart:  normal rate, regular rhythm, no murmur, and no rub. BLE without edema.  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 and R 1st MTP   Impression & Recommendations:  Problem # 1:  GOUT, UNSPECIFIED (ICD-274.9)  The following medications were removed from  the medication list:    Colcrys 0.6 Mg Tabs (Colchicine) .Marland Kitchen... Take 1 by mouth once daily His updated medication list for this problem includes:    Allopurinol 300 Mg Tabs (Allopurinol) .Marland Kitchen... 1 by mouth once daily  now with acute current flares, chronic tophi - chronic med mgt per rheum but not on colchicine d/t cost steroid shot + oral pred pak - pt has oxycodone at home for severe symptoms pain, esp at bedtime   Orders: Prescription Created Electronically [id number]) Depo- Medrol 80mg  (J1040) Admin of Therapeutic Inj  intramuscular or subcutaneous (95284)  Complete Medication List: 1)  Metoprolol  Tartrate 50 Mg Tabs (Metoprolol tartrate) .... Take 1 tablet by mouth two times a day 2)  Requip 4 Mg Tabs (Ropinirole hydrochloride) .... Take 1 tablet by mouth once a day 3)  Remeron 15 Mg Tabs (Mirtazapine) .... At bedtime 4)  Aspirin 81 Mg Tbec (Aspirin) .... Once daily 5)  Plavix 75 Mg Tabs (Clopidogrel bisulfate) .... Take 1 by mouth once daily 6)  Furosemide 20 Mg Tabs (Furosemide) .... Take 1 two times a day 7)  Flurazepam Hcl 30 Mg Caps (Flurazepam hcl) .... Take 1 by mouth once daily 8)  Simvastatin 40 Mg Tabs (Simvastatin) .Marland Kitchen.. 1 by mouth once daily 9)  Vitamin D3 1000 Unit Tabs (Cholecalciferol) .... Take 1 by mouth once daily 10)  Vesicare 10 Mg Tabs (Solifenacin succinate) .Marland Kitchen.. 1 by mouth once daily 11)  Lorazepam 1 Mg Tabs (Lorazepam) .... Take 1 by mouth once daily for nerves 12)  Gabapentin 300 Mg Caps (Gabapentin) .Marland Kitchen.. 1 by mouth two times a day 13)  Aciphex 20 Mg Tbec (Rabeprazole sodium) .Marland Kitchen.. 1 by mouth once daily 14)  Allopurinol 300 Mg Tabs (Allopurinol) .Marland Kitchen.. 1 by mouth once daily 15)  Prednisone 10 Mg Tabs (Prednisone) .... Take by mouth day 1: 6 tablets; day 2: 5 tablets; day 3: 4 tablets;  day 4: 3 tablets; day 5: 2 tablets; day 6: 1 tablet  Patient Instructions: 1)  it was good to see you today. 2)  treat gout flare with steroid shot and prednisone pack - your prescriptions have been electronically submitted to your pharmacy. Please take as directed. Contact our office if you believe you're having problems with the medication(s).  3)  talk with dr. Corliss Skains about other options for colcrys substitute (if any are available) 4)  be sure you have resumed the fluid pill furosemide two times a day  5)  keep followup as scheduled previously (or about 3 months), call sooner if problems Prescriptions: PREDNISONE 10 MG TABS (PREDNISONE) take by mouth day 1: 6 tablets; day 2: 5 tablets; day 3: 4 tablets;  day 4: 3 tablets; day 5: 2 tablets; day 6: 1 tablet  #21 x 1   Entered  and Authorized by:   Newt Lukes MD   Signed by:   Newt Lukes MD on 01/27/2010   Method used:   Electronically to        [facility] (retail)       [address]       [place], [place]  [address]       Ph: [phone]       Fax: [phone]   RxID:   [id number]      Medication Administration  Injection # 1:    Medication: Depo- Medrol 80mg     Diagnosis: GOUT, UNSPECIFIED (ICD-274.9)    Route: IM  Site: LUOQ gluteus    Exp Date: 11/10/2012    Lot #: [id number]    Mfr: [employer]    Patient tolerated injection without complications    Given by: Orlan Leavens (January 27, 2010 11:11 AM)  Orders Added: 1)  Est. Patient Level IV [44010] 2)  Prescription Created Electronically [G8553] 3)  Depo- Medrol 80mg  [J1040] 4)  Admin of Therapeutic Inj  intramuscular or subcutaneous [27253]

## 2010-08-23 NOTE — Procedures (Signed)
Summary: Upper Endoscopy  Patient: [name] Note: All result statuses are Final unless otherwise noted.  Tests: (1) Upper Endoscopy (EGD)   EGD Upper Endoscopy       DONE     [facility]     [address]     [place], [place]  [address]           ENDOSCOPY PROCEDURE REPORT           PATIENT:  Mason, [name]  MR#:  [id number]     BIRTHDATE:  1933-04-30, 76 yrs. old  GENDER:  male           ENDOSCOPIST:  Mason Rea. Jarold Motto, MD, [facility]     Referred by:           PROCEDURE DATE:  12/10/2009     PROCEDURE:  EGD with biopsy     ASA CLASS:  Class II     INDICATIONS:  GERD           MEDICATIONS:   There was residual sedation effect present from     prior procedure., Fentanyl 25 mcg IV, Versed 2 mg IV     TOPICAL ANESTHETIC:           DESCRIPTION OF PROCEDURE:   After the risks benefits and     alternatives of the procedure were thoroughly explained, informed     consent was obtained.  The LB [id number] endoscope was     introduced through the mouth and advanced to the second portion of     the duodenum, without limitations.  The instrument was slowly     withdrawn as the mucosa was fully examined.     <<PROCEDUREIMAGES>>           Gastropathy was found. EROSIONS AND NODULAR RED ANTRAL MUCOSA.CLO     BX. DONE.  Normal GE junction was noted.  The duodenal bulb was     normal in appearance, as was the postbulbar duodenum.  Otherwise     the examination was normal.    Retroflexed views revealed no     abnormalities.    The scope was then withdrawn from the patient     and the procedure completed.           COMPLICATIONS:  None           ENDOSCOPIC IMPRESSION:     1) Gastropathy     2) Normal GE junction     3) Normal duodenum     4) Otherwise normal examination     R/O H.PYLORI. GASTRITIS.HX. OF WELL CONTROLLED GERD.     RECOMMENDATIONS:     1) Anti-reflux regimen to be follow     2) Await biopsy results     3) Rx CLO if positive     4) continue PPI          REPEAT EXAM:  No           ______________________________     Mason Rea. Jarold Motto, MD, Clementeen Graham           CC:  Mason Lukes, MD           n.     Rosalie DoctorMarland Kitchen   [name] at 12/10/2009 03:42 PM           [name], [id number]  Note: An exclamation mark (!) indicates a result that was not dispersed into the flowsheet. Document Creation Date: 12/10/2009 3:43 PM _______________________________________________________________________  (1) Order  result status: Final Collection or observation date-time: 12/10/2009 15:37 Requested date-time:  Receipt date-time:  Reported date-time:  Referring Physician:   Ordering Physician: Sheryn Bison [phone]) Specimen Source:  Source: Launa Grill Order Number: [id number] Lab site:

## 2010-08-23 NOTE — Progress Notes (Signed)
Summary: RX confirmation  Phone Note From Pharmacy   Summary of Call: [facility] called, they need a drug utilization for Metoprolol Tartrate. phone # [phone], ref # [id number] Initial call taken by: Sydell Axon,  September 27, 2009 11:09 AM  Follow-up for Phone Call        Spoke with [facility] pharmacist, and they just needed to verify that the MD wants the patient to take Metoprolol. Closed phone note. Follow-up by: Lucious Groves,  September 27, 2009 2:24 PM

## 2010-08-23 NOTE — Progress Notes (Signed)
Summary: miralax rx  Phone Note Call from Patient Call back at Home Phone [phone]   Caller: Patient Reason for Call: Talk to Doctor Summary of Call: Pt is requesting a big bottle of miralax sent to [facility]. Pls advise Initial call taken by: Orlan Leavens RMA,  May 11, 2010 2:29 PM  Follow-up for Phone Call        ok - done Follow-up by: Newt Lukes MD,  May 11, 2010 3:37 PM    New/Updated Medications: POLYETHYLENE GLYCOL 3350  POWD (POLYETHYLENE GLYCOL 3350) 1 scoop in 17oz water two times a day Prescriptions: POLYETHYLENE GLYCOL 3350  POWD (POLYETHYLENE GLYCOL 3350) 1 scoop in 17oz water two times a day  #1 large x 6   Entered and Authorized by:   Newt Lukes MD   Signed by:   Newt Lukes MD on 05/11/2010   Method used:   Electronically to        [facility] (retail)       [address]       [place], [place]  [address]       Ph: [phone]       Fax: [phone]   RxID:   [id number]

## 2010-08-23 NOTE — Progress Notes (Signed)
Summary: BP  Phone Note From Other Clinic   Caller: Lisa at Dr. Fatima Sanger office Summary of Call: Patient was seen today at Dr. Fatima Sanger office. BP was 82/43 (L. arm), 83/43 (R. Arm). Requesting if any adjustments need to be made on pts. BP medication. Initial call taken by: Scharlene Gloss,  December 24, 2009 2:24 PM  Follow-up for Phone Call        is patient actaully taking the metoprolol AND the coreg?  please clarify med lists with pt or family Follow-up by: Corwin Levins MD,  December 24, 2009 2:26 PM  Additional Follow-up for Phone Call Additional follow up Details #1::        Called pt no ansew LMOM RTC concerning BP meds  left message on machine for pt to return my call Margaret Pyle, CMA  December 27, 2009 11:58 AM  Additional Follow-up by: Orlan Leavens,  December 24, 2009 3:10 PM    Additional Follow-up for Phone Call Additional follow up Details #2::    per pt, he is taking both medications.   *Pt okay with message on VM with MD advisement* Margaret Pyle, CMA  December 28, 2009 9:04 AM   Additional Follow-up for Phone Call Additional follow up Details #3:: Details for Additional Follow-up Action Taken: if pt is dizzy, go to er.  if not, stop lisinopril and have ov here 2 days, any dr Additional Follow-up by: Minus Breeding MD,  December 28, 2009 12:00 PM  i am advised pt is off lisinopril, so i have deleted it from the med list.  please advise pt to stop coreg and see any dr here in 2 days Hendrix Console, md  Pt advised and scheduled with VAL 06/15 @ 10:45a Margaret Pyle, CMA  December 28, 2009 2:22 PM

## 2010-08-23 NOTE — Assessment & Plan Note (Signed)
Summary: [name]   History of Present Illness Visit Type: Initial Consult Primary Provider: Otho Najjar, MD Chief Complaint: Patient comes for evaluation of diarrhea. He states that this diarrhea has actually subsided at this time. He states that he is normally constipated but did have a several week epsiode of diarrhea without any abdominal pain, blood in the stool or upper GI problems. History of Present Illness:   77 year old white male referred by Dr. 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 He recently had an exacerbation of his diarrhea with and responded well to metronidazole therapy.  This Patient has chronic atherogenesis, peripheral vascular disease, previous TIAs, coronary artery disease with stenting and cardiac surgery, hyperlipidemia, severe gout, hypertension, and sleep apnea. He is on multiple medications and is followed closely by Dr. Donnie Aho and cardiology. He is chronically on Plavix and aspirin and all so multiple medications for hypertension and restless leg syndrome and hypertension.  GI-wise he's had chronic GERD but is not on PPI therapy. He denies dysphasia or any specific hepatobiliary complaints. He has not had previous cholecystectomy. He denies abuse of alcohol or cigarettes. He gives no history of pancreatitis or hepatitis or known hepatic abnormalities. He has chronic diarrhea with gas and bloating but denies melena or hematochezia. His appetite is good and his weight is stable he denies any specific food intolerances. He has not had previous colonoscopy, but apparently had a flexible sigmoidoscopy by Dr. Lorenz Coaster this report is not available for review. Family history is noncontributory.   GI Review of Systems      Denies abdominal pain, acid reflux, belching, bloating, chest pain, dysphagia with liquids, dysphagia with solids, heartburn, loss of appetite, nausea,  vomiting, vomiting blood, weight loss, and  weight gain.      Reports diarrhea.     Denies anal fissure, black tarry stools, change in bowel habit, constipation, diverticulosis, fecal incontinence, heme positive stool, hemorrhoids, irritable bowel syndrome, jaundice, light color stool, liver problems, rectal bleeding, and  rectal pain.    Current Medications (verified): 1)  Metoprolol Tartrate 50 Mg Tabs (Metoprolol Tartrate) .... Take 1 Tablet By Mouth Two Times A Day 2)  Requip 4 Mg Tabs (Ropinirole Hydrochloride) .... Take 1 Tablet By Mouth Once A Day 3)  Remeron 15 Mg Tabs (Mirtazapine) .... At Bedtime 4)  Colcrys 0.6 Mg Tabs (Colchicine) .Marland Kitchen.. 1 By Mouth Two Times A Day 5)  Aspirin 81 Mg Tbec (Aspirin) .... Once Daily 6)  Plavix 75 Mg Tabs (Clopidogrel Bisulfate) .... Take 1 By Mouth Once Daily 7)  Furosemide 20 Mg Tabs (Furosemide) .... Take 1 Two Times A Day - Hold 8)  Coreg 12.5 Mg Tabs (Carvedilol) .... Take 1 Two Times A Day 9)  Lisinopril 40 Mg Tabs (Lisinopril) .... Stop Until Potassium Recheck 10)  Flurazepam Hcl 30 Mg Caps (Flurazepam Hcl) .... Take 1 By Mouth Once Daily 11)  Simvastatin 40 Mg Tabs (Simvastatin) .Marland Kitchen.. 1 By Mouth Once Daily 12)  Vitamin D3 1000 Unit Tabs (Cholecalciferol) .... Take 1 By Mouth Once Daily 13)  Vesicare 10 Mg Tabs (Solifenacin Succinate) .Marland Kitchen.. 1 By Mouth Once Daily 14)  Lorazepam 1 Mg Tabs (Lorazepam) .... Take 1 By Mouth Once Daily For Nerves 15)  Gabapentin 300 Mg Caps (Gabapentin) .Marland Kitchen.. 1 By Mouth Two Times A Day 16)  Vistaril 50 Mg Caps (Hydroxyzine Pamoate) .Marland Kitchen.. 1 By Mouth Three Times A Day X 7 Days, Then As  Needed For Itch and Rash 17)  Triamcinolone Acetonide 0.025 % Lotn (Triamcinolone Acetonide) .... Apply To Itchy Skin Three Times A Day As Needed  Allergies (verified): 1)  ! Lipitor (Atorvastatin Calcium)  Past History:  Past medical, surgical, family and social histories (including risk factors) reviewed for relevance to current acute  and chronic problems.  Past Medical History: Reviewed history from 11/17/2009 and no changes required. Pericarditis MSSA s/ p pericardia window 2007 Hypertension Hyperlipidemia Arthritis-gouty   Degenerative disc disease History of Transient Ischemic attack-2005 History of Left Carotid Endarterectomy Myocardial Infarction-2004 Mild sleep apnea Restless Leg Syndrome Retinal detachment-1994 Cataracts Coronary artery disease     GERD Urinary incontinence, urge  MD rooster: cards- [facility] - early rheum- s. deveshwar  Past Surgical History: PTCA/stent (01/2009) pericardial window (2007) Nerve in arm-left (2008) right arm surgery  Family History: Reviewed history from 08/30/2009 and no changes required. Family History Diabetes 1st degree relative (parent)-father Family History of Prostate CA 1st degree relative <50 (other relative): Brother Heart disease (other relative): Mother, Father No FH of Colon Cancer: Family History of Liver Cancer:brother?  Social History: Reviewed history from 09/16/2009 and no changes required. 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  The patient denies anemia, anxiety-new, breast changes/lumps, change in vision, confusion, cough, coughing up blood, depression-new, fainting, fatigue, fever, headaches-new, hearing problems, heart murmur, heart rhythm changes, itching, menstrual pain, night sweats, nosebleeds, pregnancy symptoms, sore throat, swollen lymph glands, thirst - excessive , urination - excessive , urination changes/pain, vision changes, and voice change.   General:  Complains of  fatigue and sleep disorder; denies fever, chills, sweats, anorexia, weakness, malaise, and weight loss. Eyes:  Denies blurring, diplopia, irritation, discharge, vision loss, scotoma, eye pain, and photophobia; previous That surgery for retinal detachment in 1994. ENT:  Complains of nasal congestion; denies earache, ear discharge, tinnitus, decreased hearing, loss of smell, nosebleeds, sore throat, hoarseness, and difficulty swallowing. 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 GU:  Complains of urinary frequency, nocturnal urination, urinary incontinence, and erectile dysfunction; denies urinary burning, blood in urine, urinary hesitancy, penile discharge, genital sores, and decreased libido. MS:  Complains of joint swelling, joint stiffness, and joint deformity; denies joint pain / LOM, low back pain, muscle weakness, muscle cramps, muscle atrophy, leg pain at night, leg pain with exertion, and shoulder pain / LOM hand / wrist pain (CTS); Chronic severe gouty arthritis was numerous tophi. It has not had a severe attack in many years. He also suffers from chronic restless leg syndrome.. Derm:  Complains of dry skin and moles; denies rash, itching, hives, warts, and unhealing ulcers. Neuro:  Complains of weakness, abnormal sensation, difficulty walking, and radiculopathy other:; denies  paralysis,  seizures, syncope, tremors, vertigo, transient blindness, frequent falls, frequent headaches, headache, sciatica, restless legs, memory loss, and confusion. Psych:  Denies depression, anxiety, memory loss, suicidal ideation, hallucinations, paranoia, phobia, and confusion. Endo:  Complains of polyuria; denies cold intolerance, heat intolerance, polydipsia, polyphagia, unusual weight change, and hirsutism. Heme:  Complains of bruising; denies bleeding, enlarged lymph nodes, and pagophagia. Allergy:  Denies hives, rash, sneezing, hay fever, and recurrent infections.  Vital Signs:  Patient profile:   77 year old male Height:      67 inches Weight:      225.38 pounds BMI:     35.43 BSA:     2.13 Pulse rate:   72 / minute Pulse rhythm:   regular BP sitting:   142 / 62  (right arm)  Vitals Entered By: Lamona Curl CMA Duncan Dull) (Nov 30, 2009 1:34 PM)  Physical Exam  General:  Well developed, well nourished, no acute distress.obese and poor hygiene.   Head:  Normocephalic and atraumatic. Eyes:  PERRLA, no icterus.exam deferred to patient's ophthalmologist.   Neck:  Supple; no masses or thyromegaly. Lungs:  decreased BS on L and decreased BS on R.   Heart:  Regular rate and rhythm; no murmurs, rubs,  or bruits.prominent 3/6 systolic ejection murmur at the base of the heart without significant radiation noted. Abdomen:  Large ventral hernia noted. He has been obese protuberant abdomen but no definite organomegaly, masses or tenderness. Rectal:  inspection the perirectal area is unremarkable. He appears to have fairly good rectal tone. No rectal masses or tenderness with soft stool present which is guaiac negative. Prostate:  .normal size prostate.   Msk:  joint tenderness, rhematoid changes, and arthritic changes.  Numerousgouty through the  extensor services of most joints. He also has areas of ecchymosis and easy bruisability. Extremities:  No clubbing, cyanosis, edema or  deformities noted.trace pedal edema.   Neurologic:  Alert and  oriented x4;  grossly normal neurologically.ataxia and weakness noted.   Skin:  petechiae and maculopapular rash:.   Cervical Nodes:  No significant cervical adenopathy. Psych:  Alert and cooperative. Normal mood and affect.poor memory.     Impression & Recommendations:  Problem # 1:  GERD (ICD-530.81) Assessment Unchanged Have Scheduled endoscopy to exclude Barrett's mucosa and to exclude H. pylori infection. His never had endoscopy and continues with rather marked reflux symptoms. I have prescribed AcipHex 20 mg q.a.m. Samples were given to the patient. Orders: TLB-B12, Serum-Total ONLY ([address]-U04) TLB-Ferritin (82728-FER) TLB-Folic Acid (Folate) (82746-FOL) TLB-IBC Pnl (Iron/FE;Transferrin) (83550-IBC)  Problem # 2:  DIARRHEA (ICD-787.91) Assessment: Improved Recurrent diarrhea surely related to multiple antibiotics and relapsing C. difficile infection. We will check colonoscopy exam and I have placed him on daily Florstar for one month. He has no symptoms of chronic malabsorption and no history of recurrent diverticulitis. Because the chronic anticoagulation we will hold his Plavix 5 days before his colonoscopy. I will send this letter to Dr. Donnie Aho to be sure that this is acceptable.A Cardiac Stent was placed one year ago.  Problem # 3:  HYPERTENSION (ICD-401.9) Assessment: Improved Blood Pressure today is 142/62 he is to continue all of his multiple cardiac medications.  Problem # 4:  OTHER AND UNSPECIFIED COAGULATION DEFECTS (ICD-286.9) Assessment: Unchanged we'll continue aspirin but hold Plavix 5 days before his endoscopic procedures. Orders: TLB-B12, Serum-Total ONLY ([address]-J19) TLB-Ferritin (82728-FER) TLB-Folic Acid (Folate) (82746-FOL) TLB-IBC Pnl (Iron/FE;Transferrin) (83550-IBC)  Problem # 5:  CORONARY ARTERY DISEASE (ICD-414.00) Assessment: Comment Only  Problem # 6:  RESTLESS LEG SYNDROME  (ICD-333.94) Assessment: Comment Only  Problem # 7:  GOUT, UNSPECIFIED (ICD-274.9) Assessment: Comment Only  Problem # 8:  SCREENING COLORECTAL-CANCER (ICD-V76.51) Assessment: Unchanged Colonoscopy scheduled with standard balanced electrolyte preparation. This patient may be difficult to sedate because he takes chronic Remeron, Gabepentum,and lorazepam.  Patient Instructions: 1)  Please gop to the basement for lab work.   2)  Begin Florastor daily for one month. BUY this OTC> 3)  Begin aciphex daily, 4)  You are scheduled for and endoscopy and colonoscopy, 5)  The medication list was reviewed and reconciled.  All changed / newly prescribed medications were explained.  A complete medication list was provided to the patient / caregiver. 6)  Copy sent to : Dr. Viann Fish in cardiology and Dr. [name] in primary care 7)  Please continue current medications.  8)  Colonoscopy and Flexible Sigmoidoscopy brochure given.  9)  Conscious Sedation brochure given.  10)  Upper Endoscopy brochure given.   Appended Document: [name]    Clinical Lists Changes  Medications: Added new medication of MOVIPREP 100 GM  SOLR (PEG-KCL-NACL-NASULF-NA ASC-C) As per prep instructions. - Signed Added new medication of ACIPHEX 20 MG TBEC (RABEPRAZOLE SODIUM) 1 by mouth once daily - Signed Added new medication of FLORASTOR 250 MG PACK (SACCHAROMYCES BOULARDII) 1 daily X one month Rx of MOVIPREP 100 GM  SOLR (PEG-KCL-NACL-NASULF-NA ASC-C) As per prep instructions.;  #1 x 0;  Signed;  Entered by: Ashok Cordia RN;  Authorized by: Mardella Layman MD [facility];  Method used: Electronically to [facility] [address] [place], [place], [place]  [address], Ph: [phone], Fax: [phone] Rx of ACIPHEX 20 MG TBEC (RABEPRAZOLE SODIUM) 1 by mouth once daily;  #30 x 6;  Signed;  Entered by: Ashok Cordia RN;  Authorized by: Mardella Layman MD [facility];  Method used: Electronically to [facility] [address], [place], [place], [place]  [address], Ph: [phone], Fax: [phone] Orders: Added new Test order of 24 hour Ph Probe (24 hr Ph probe) - Signed Added new Test order of Colon/Endo (Colon/Endo) - Signed Added new Test order of Colonoscopy (Colon) - Signed    Prescriptions: ACIPHEX 20 MG TBEC (RABEPRAZOLE SODIUM) 1 by mouth once daily  #30 x 6   Entered by:   Ashok Cordia RN   Authorized by:   Mardella Layman MD [facility]   Signed by:   Ashok Cordia RN on 11/30/2009   Method used:   Electronically to        [facility] (retail)       [address]       [place], [place]  [address]       Ph: [phone]       Fax: [phone]   RxID:   [id number] MOVIPREP 100 GM  SOLR (PEG-KCL-NACL-NASULF-NA ASC-C) As per prep instructions.  #1 x 0   Entered by:   Ashok Cordia RN   Authorized by:   Mardella Layman MD [facility]   Signed by:   Ashok Cordia RN on 11/30/2009   Method used:   Electronically to        [facility] (retail)       [address]       [place], [place]  [address]       Ph: [phone]       Fax: [phone]  RxID:   [id number]    Appended Document: [name]    Clinical Lists Changes  Orders: Added new Test order of EGD (EGD) - Signed

## 2010-08-23 NOTE — Assessment & Plan Note (Signed)
Summary: FU/ HEARTBURN/NWS  #   Vital Signs:  Patient profile:   77 year old male Height:      67 inches (170.18 cm) Weight:      229.2 pounds (104.18 kg) O2 Sat:      94 % on Room air Temp:     97.4 degrees F (36.33 degrees C) oral Pulse rate:   55 / minute BP sitting:   110 / 52  (right arm) Cuff size:   regular  Vitals Entered By: Orlan Leavens (November 01, 2009 11:07 AM)  O2 Flow:  Room air CC: heartburn, Diarrhea Is Patient Diabetic? No Pain Assessment Patient in pain? no        Primary Care Provider:  Newt Lukes MD  CC:  heartburn and Diarrhea.  History of Present Illness:  Diarrhea      This is a 77 year old man who presents with Diarrhea.  The symptoms began 3 weeks ago.  The severity is described as moderate.  The patient reports 4-6 stools per day and watery/unformed stools, but denies blood in stool and mucus in stool.  The patient denies fever, abdominal pain, abdominal cramps, nausea, vomiting, weight loss, and joint pains.  The symptoms are worse with any food.  Patient's risk factors for diarrhea include sick contact (hospital via wife's illness) and recent antibiotic use. has never had colonoscopy  also needs 3 mo supply of meds  dyslipidemia - reports compliance with ongoing medical treatment and no changes in medication dose or frequency. denies adverse side effects related to current therapy.   HTN - reports compliance with ongoing medical treatment and no changes in medication dose or frequency. denies adverse side effects related to current therapy.  c/o occ cramps in hands and thighs with is different than RLS symptoms (on requip)  gout - no recent flares since 6 weeks ago - on meds w/o change -   Clinical Review Panels:  Immunizations   Last Tetanus Booster:  Historical (07/25/2003)   Last Pneumovax:  Historical (07/24/2005)  CBC   WBC:  8.4 (08/30/2009)   RBC:  4.88 (08/30/2009)   Hgb:  14.4 (08/30/2009)   Hct:  44.6 (08/30/2009)  Platelets:  166.0 (08/30/2009)   MCV  91.3 (08/30/2009)   MCHC  32.2 (08/30/2009)   RDW  13.3 (08/30/2009)   PMN:  66.2 (08/30/2009)   Lymphs:  17.7 (08/30/2009)   Monos:  12.2 (08/30/2009)   Eosinophils:  3.5 (08/30/2009)   Basophil:  0.4 (08/30/2009)  Complete Metabolic Panel   Glucose:  102 (08/30/2009)   Sodium:  142 (08/30/2009)   Potassium:  4.7 (08/30/2009)   Chloride:  103 (08/30/2009)   CO2:  33 (08/30/2009)   BUN:  16 (08/30/2009)   Creatinine:  1.2 (08/30/2009)   Calcium:  9.5 (08/30/2009)   Current Medications (verified): 1)  Metoprolol Tartrate 50 Mg Tabs (Metoprolol Tartrate) .... Take 1 Tablet By Mouth Two Times A Day 2)  Lorazepam  Tabs (Lorazepam Tabs) .... Take 1 Tablet By Mouth Two Times A Day 3)  Requip 4 Mg Tabs (Ropinirole Hydrochloride) .... Take 1 Tablet By Mouth Once A Day 4)  Remeron 15 Mg Tabs (Mirtazapine) .... At Bedtime 5)  Colcrys 0.6 Mg Tabs (Colchicine) .Marland Kitchen.. 1 By Mouth Two Times A Day 6)  Aspirin 81 Mg Tbec (Aspirin) .... Once Daily 7)  Plavix 75 Mg Tabs (Clopidogrel Bisulfate) .... Take 1 By Mouth Qd 8)  Furosemide 20 Mg Tabs (Furosemide) .... Take 1 Two Times  A Day 9)  Gabapentin 100 Mg Caps (Gabapentin) .... Take 1 Two Times A Day 10)  Coreg 12.5 Mg Tabs (Carvedilol) .... Take 1 Two Times A Day 11)  Lisinopril 40 Mg Tabs (Lisinopril) .... Take 1 By Mouth Qd 12)  Flurazepam Hcl 30 Mg Caps (Flurazepam Hcl) .... Take 1 By Mouth Qd 13)  Simvastatin 40 Mg Tabs (Simvastatin) .... Take 1 By Mouth Qd 14)  Vitamin D3 1000 Unit Tabs (Cholecalciferol) .... Take 1 By Mouth Qd 15)  Allopurinol 100 Mg Tabs (Allopurinol) .Marland Kitchen.. 1 By Mouth Three Times A Day 16)  Vesicare 5 Mg Tabs (Solifenacin Succinate) .Marland Kitchen.. 1 By Mouth Once Daily  Allergies (verified): No Known Drug Allergies  Past History:  Past Medical History: Pericarditis MSSA s/ p pericardia window 2007 Hypertension Hyperlipidemia Arthritis-gouty Degenerative disc disease History of Transient  Ischemic attack-2005 History of Left Carotid Endarterectomy Myocardial Infarction-2004 Mild sleep apnea Restless Leg Syndrome Retinal detachment-1994 Cataracts Coronary artery disease     GERD Urinary incontinence, urge  MD rooster: cards- [facility] - early rheum- s. deveshwar  Review of Systems  The patient denies chest pain, syncope, dyspnea on exertion, peripheral edema, headaches, abdominal pain, and melena.         c/o poor sleep and indigestion symptoms since wife's hosp for PNA 09/2009  Physical Exam  General:  alert, well-developed, well-nourished, and cooperative to examination.  dtr and wide at side Lungs:  normal respiratory effort, no intercostal retractions or use of accessory muscles; normal breath sounds bilaterally - no crackles and no wheezes.    Heart:  normal rate, regular rhythm, no murmur, and no rub. BLE without edema.  Abdomen:  soft, non-tender, normal bowel sounds, no distention; no masses and no appreciable hepatomegaly or splenomegaly.   Neurologic:  left radial radial nerve palsy (without change since surg 2007 per pt recollection)   Impression & Recommendations:  Problem # 1:  DIARRHEA (ICD-787.91)  ?c diff with hosp exposure and recent abx for pulm illness - tx emperic with flagyl and check labs now - also refer to GI given change in bowel habits and no prior colo Orders: Gastroenterology Referral (GI) TLB-CBC Platelet - w/Differential (85025-CBCD) TLB-Hepatic/Liver Function Pnl (80076-HEPATIC) TLB-BMP (Basic Metabolic Panel-BMET) (80048-METABOL) Prescription Created Electronically [phone])  Discussed symptom control and diet. Call if worsening of symptoms or signs of dehydration.   Problem # 2:  GERD (ICD-530.81) consider need for PPI but will tx problem #1 1st (to avoid exac loose stool symptoms with med)  Problem # 3:  URINARY INCONTINENCE, URGE (ICD-788.31) inc dose vesicare (improved on 5mg , wants to try 10mg )  Problem # 4:   HYPERLIPIDEMIA (ICD-272.4)  His updated medication list for this problem includes:    Lipitor 40 Mg Tabs (Atorvastatin calcium) .Marland Kitchen... 1 by mouth at bedtime  Orders: TLB-Lipid Panel (80061-LIPID)  Problem # 5:  HYPERTENSION (ICD-401.9)  His updated medication list for this problem includes:    Metoprolol Tartrate 50 Mg Tabs (Metoprolol tartrate) .Marland Kitchen... Take 1 tablet by mouth two times a day    Furosemide 20 Mg Tabs (Furosemide) .Marland Kitchen... Take 1 two times a day    Coreg 12.5 Mg Tabs (Carvedilol) .Marland Kitchen... Take 1 two times a day    Lisinopril 40 Mg Tabs (Lisinopril) ..... Stop until potassium recheck  BP today: 110/52 Prior BP: 112/62 (10/05/2009)  Labs Reviewed: K+: 4.7 (08/30/2009) Creat: : 1.2 (08/30/2009)     Problem # 6:  CORONARY ARTERY DISEASE (ICD-414.00)  His updated medication list  for this problem includes:    Metoprolol Tartrate 50 Mg Tabs (Metoprolol tartrate) .Marland Kitchen... Take 1 tablet by mouth two times a day    Aspirin 81 Mg Tbec (Aspirin) ..... Once daily    Plavix 75 Mg Tabs (Clopidogrel bisulfate) .Marland Kitchen... Take 1 by mouth qd    Furosemide 20 Mg Tabs (Furosemide) .Marland Kitchen... Take 1 two times a day    Coreg 12.5 Mg Tabs (Carvedilol) .Marland Kitchen... Take 1 two times a day    Lisinopril 40 Mg Tabs (Lisinopril) ..... Stop until potassium recheck  DES stent to cx done 01/2009- hosp records reviewed no current symptoms of angina cont med mgmt as ongoing per [facility]  Complete Medication List: 1)  Metoprolol Tartrate 50 Mg Tabs (Metoprolol tartrate) .... Take 1 tablet by mouth two times a day 2)  Requip 4 Mg Tabs (Ropinirole hydrochloride) .... Take 1 tablet by mouth once a day 3)  Remeron 15 Mg Tabs (Mirtazapine) .... At bedtime 4)  Colcrys 0.6 Mg Tabs (Colchicine) .Marland Kitchen.. 1 by mouth two times a day 5)  Aspirin 81 Mg Tbec (Aspirin) .... Once daily 6)  Plavix 75 Mg Tabs (Clopidogrel bisulfate) .... Take 1 by mouth qd 7)  Furosemide 20 Mg Tabs (Furosemide) .... Take 1 two times a day 8)  Coreg 12.5 Mg  Tabs (Carvedilol) .... Take 1 two times a day 9)  Lisinopril 40 Mg Tabs (Lisinopril) .... Stop until potassium recheck 10)  Flurazepam Hcl 30 Mg Caps (Flurazepam hcl) .... Take 1 by mouth qd 11)  Lipitor 40 Mg Tabs (Atorvastatin calcium) .Marland Kitchen.. 1 by mouth at bedtime 12)  Vitamin D3 1000 Unit Tabs (Cholecalciferol) .... Take 1 by mouth qd 13)  Allopurinol 300 Mg Tabs (Allopurinol) .Marland Kitchen.. 1 by mouth once daily 14)  Vesicare 10 Mg Tabs (Solifenacin succinate) .Marland Kitchen.. 1 by mouth once daily 15)  Flagyl 500 Mg Tabs (Metronidazole) .Marland Kitchen.. 1 by mouth three times a day x 7 days 16)  Lorazepam 1 Mg Tabs (Lorazepam) .... Take 1 by mouth once daily for nerves 17)  Gabapentin 300 Mg Caps (Gabapentin) .Marland Kitchen.. 1 by mouth two times a day  Patient Instructions: 1)  it was good to see you today.  2)  refills as discussed and medication doses adjusted as below - call and let us know dose on lorazepam so we can call refills in for you 3)  antibiotics for your diarrhea as discussed - your prescriptions have been electronically submitted to your local pharmacy. Please take as directed. Contact our office if you believe you're having problems with the medication(s).   4)  test(s) ordered today - your results will be posted on the phone tree for review in 48-72 hours from the time of test completion; call [phone] and enter your 9 digit MRN (listed above on this page, just below your name); if any changes need to be made or there are abnormal results, you will be contacted directly.  5)  we'll make referral to GI for evaluation of your diarrhea. Our office will contact you regarding this appointment once made.  Prescriptions: ALLOPURINOL 300 MG TABS (ALLOPURINOL) 1 by mouth once daily  #90 x 3   Entered by:   Orlan Leavens   Authorized by:   Newt Lukes MD   Signed by:   Orlan Leavens on 11/01/2009   Method used:   Faxed to ...       [facility]* Environmental education officer)             ,  Ph: [phone]       Fax:  [phone]   RxID:   [id number] VESICARE 10 MG TABS (SOLIFENACIN SUCCINATE) 1 by mouth once daily  #90 x 3   Entered by:   Orlan Leavens   Authorized by:   Newt Lukes MD   Signed by:   Orlan Leavens on 11/01/2009   Method used:   Faxed to ...       [facility]* (mail-order)             ,          Ph: [phone]       Fax: [phone]   RxID:   [id number] REMERON 15 MG TABS (MIRTAZAPINE) at bedtime  #90 x 3   Entered by:   Orlan Leavens   Authorized by:   Newt Lukes MD   Signed by:   Orlan Leavens on 11/01/2009   Method used:   Faxed to ...       [facility]* (mail-order)             ,          Ph: [phone]       Fax: [phone]   RxID:   [id number] REQUIP 4 MG TABS (ROPINIROLE HYDROCHLORIDE) Take 1 tablet by mouth once a day  #90 x 3   Entered by:   Orlan Leavens   Authorized by:   Newt Lukes MD   Signed by:   Orlan Leavens on 11/01/2009   Method used:   Faxed to ...       [facility]* (mail-order)             ,          Ph: [phone]       Fax: [phone]   RxID:   [id number] LORAZEPAM 1 MG TABS (LORAZEPAM) take 1 by mouth once daily for nerves  #30 x 1   Entered by:   Orlan Leavens   Authorized by:   Newt Lukes MD   Signed by:   Orlan Leavens on 11/01/2009   Method used:   Telephoned to ...       [facility]* (retail)       [address]       [place], [place]  [address]       Ph: [phone]       Fax: [phone]   RxID:   [id number] FLAGYL 500 MG TABS (METRONIDAZOLE) 1 by mouth three times a day x 7 days  #21 x 0   Entered and Authorized by:   Newt Lukes MD   Signed by:   Newt Lukes MD on 11/01/2009   Method used:   Electronically to        [facility]* (retail)       [address]       [place], [place]  [address]       Ph: [phone]       Fax: [phone]   RxID:   [id number]

## 2010-08-23 NOTE — Assessment & Plan Note (Signed)
Summary: post hospital.cd   Vital Signs:  Patient profile:   77 year old male Height:      67 inches (170.18 cm) Weight:      238.0 pounds (108.18 kg) O2 Sat:      93 % on Room air Temp:     97.3 degrees F (36.28 degrees C) oral Pulse rate:   69 / minute BP sitting:   132 / 64  (left arm) Cuff size:   regular  Vitals Entered By: Orlan Leavens RMA (April 19, 2010 2:30 PM)  O2 Flow:  Room air CC: Hosp f/u Is Patient Diabetic? No Pain Assessment Patient in pain? no        Primary Care Nakeitha Milligan:  [name], MD  CC:  Hosp f/u.  History of Present Illness:  recent hosp for NSTEMI no intervention done - meds reviewed no further CP  overactive bladder - esp nocturia ineffective vesicare - wants to change to oxybutinin no fever or hematuria  grief reaction - wife passed this AM 04/19/10 following prolonged illness and freq hosp- requests something to help with sleep and nerves  new DM  dx- dx hosp 9/21-25/2011 at admission when cbg high steroid shot in knee for gout 2 days prior to hosp - never on meds for same - no polydispsia or blurred vision- plans to change diet and inc exercise   Clinical Review Panels:  Diabetes Management   HgBA1C:  6.2 (04/16/2010)   Creatinine:  1.18 (04/17/2010)   Last Flu Vaccine:  Historical given @ hosp (04/14/2010)   Last Pneumovax:  Historical (07/24/2005)  CBC   WBC:  11.2 (04/16/2010)   RBC:  4.23 (04/16/2010)   Hgb:  12.9 (04/16/2010)   Hct:  40.3 (04/16/2010)   Platelets:  141 (04/16/2010)   MCV  95.3 (04/16/2010)   MCHC  34.5 (11/24/2009)   RDW  15.4 (04/16/2010)   PMN:  92 (04/13/2010)   Lymphs:  20.6 (11/24/2009)   Monos:  1 (04/13/2010)   Eosinophils:  0 (04/13/2010)   Basophil:  0 (04/13/2010)  Complete Metabolic Panel   Glucose:  118 (04/17/2010)   Sodium:  137 (04/17/2010)   Potassium:  3.9 (04/17/2010)   Chloride:  101 (04/17/2010)   CO2:  29 (04/17/2010)   BUN:  21 (04/17/2010)   Creatinine:   1.18 (04/17/2010)   Albumin:  3.8 (04/13/2010)   Total Protein:  7.3 (04/13/2010)   Calcium:  8.7 (04/17/2010)   Total Bili:  0.5 (04/13/2010)   Alk Phos:  68 (04/13/2010)   SGPT (ALT):  26 (04/13/2010)   SGOT (AST):  39 (04/13/2010)   Current Medications (verified): 1)  Metoprolol Tartrate 50 Mg Tabs (Metoprolol Tartrate) .... Take 1 Tablet By Mouth Two Times A Day 2)  Requip 4 Mg Tabs (Ropinirole Hydrochloride) .... Take 1 Tablet By Mouth Once A Day 3)  Remeron 15 Mg Tabs (Mirtazapine) .... At Bedtime 4)  Aspirin 81 Mg Tbec (Aspirin) .... Once Daily 5)  Plavix 75 Mg Tabs (Clopidogrel Bisulfate) .... Take 1 By Mouth Once Daily 6)  Furosemide 20 Mg Tabs (Furosemide) .... Take 1 Two Times A Day 7)  Flurazepam Hcl 30 Mg Caps (Flurazepam Hcl) .... Take 1 By Mouth Once Daily 8)  Simvastatin 40 Mg Tabs (Simvastatin) .Marland Kitchen.. 1 By Mouth Once Daily 9)  Vitamin D3 1000 Unit Tabs (Cholecalciferol) .... Take 1 By Mouth Once Daily 10)  Lorazepam 1 Mg Tabs (Lorazepam) .... Take 1 By Mouth Once Daily For Nerves 11)  Gabapentin  300 Mg Caps (Gabapentin) .Marland Kitchen.. 1 By Mouth Two Times A Day 12)  Allopurinol 300 Mg Tabs (Allopurinol) .Marland Kitchen.. 1 By Mouth Once Daily 13)  Colcrys 0.6 Mg Tabs (Colchicine) .... Take 1 Two Times A Day 14)  Nitrostat 0.4 Mg Subl (Nitroglycerin) .... Use As Needed 15)  Pepcid Ac 10 Mg Tabs (Famotidine) .... Take 1 By Mouth Once Daily 16)  Vesicare 5 Mg Tabs (Solifenacin Succinate) .... Take 1 By Mouth Once Daily  Allergies (verified): 1)  ! Lipitor (Atorvastatin Calcium)  Past History:  Past Medical History: Pericarditis MSSA s/ p pericardia window 2007 Hypertension Hyperlipidemia Arthritis-gouty   Degenerative disc disease   History of Transient Ischemic attack-2005 History of Left Carotid Endarterectomy Myocardial Infarction-2004 Mild sleep apnea Restless Leg Syndrome Retinal detachment-1994 Cataracts Coronary artery disease     GERD Urinary incontinence, urge   MD  roster: cards- [facility] - early rheum- s. deveshwar ortho - [facility]  Review of Systems  The patient denies fever, chest pain, syncope, peripheral edema, and headaches.    Physical Exam  General:  alert, well-developed, well-nourished, and cooperative to examination.  Lungs:  normal respiratory effort, no intercostal retractions or use of accessory muscles; normal breath sounds bilaterally - no crackles and no wheezes.    Heart:  normal rate, regular rhythm, no murmur, and no rub. BLE without edema.  Msk:  chronic tophi over both knees (R>L) Psych:  reserved but Oriented X3, memory intact for recent and remote, fair eye contact, not anxious appearing, approp depressed appearing, and not agitated.   tearful at times   Impression & Recommendations:  Problem # 1:  DIABETES MELLITUS, TYPE II (ICD-250.00)  new dx, steroid induced hyperglycemia on admission to hosp 03/2010 - will plan diet control at this time -  watch while on steroid (freq steroid use with severe gouty flares) educated on diet, exercise and cbg monitoring - glucometer and strips rx'd His updated medication list for this problem includes:    Aspirin 81 Mg Tbec (Aspirin) ..... Once daily  Labs Reviewed: Creat: 1.18 (04/17/2010)    Reviewed HgBA1c results: 6.2 (04/16/2010)  Problem # 2:  MYOCARDIAL INFARCTION (ICD-410.90) NQWMI 03/2010 - med mgmt rec following cath hosp dc summary and meds reviewed - cont same His updated medication list for this problem includes:    Metoprolol Tartrate 50 Mg Tabs (Metoprolol tartrate) .Marland Kitchen... Take 1 tablet by mouth two times a day    Aspirin 81 Mg Tbec (Aspirin) ..... Once daily    Plavix 75 Mg Tabs (Clopidogrel bisulfate) .Marland Kitchen... Take 1 by mouth once daily    Furosemide 20 Mg Tabs (Furosemide) .Marland Kitchen... Take 1 two times a day    Nitrostat 0.4 Mg Subl (Nitroglycerin) ..... Use as needed  Labs Reviewed: Chol: 187 (04/13/2010)   HDL: 41 (04/13/2010)   LDL: 115 (04/13/2010)   TG: 154  (04/13/2010)  Problem # 3:  URINARY INCONTINENCE, URGE (ICD-788.31)  change vesicare to oxybutnin at pt request for cost  Orders: Prescription Created Electronically [phone])  Problem # 4:  HYPERLIPIDEMIA (ICD-272.4)  His updated medication list for this problem includes:    Simvastatin 40 Mg Tabs (Simvastatin) .Marland Kitchen... 1 by mouth once daily  Labs Reviewed: SGOT: 39 (04/13/2010)   SGPT: 26 (04/13/2010)   HDL:41 (04/13/2010), 42.20 (11/01/2009)  LDL:115 (04/13/2010)  Chol:187 (04/13/2010), 224 (11/01/2009)  Trig:154 (04/13/2010), 249.0 (11/01/2009)  Problem # 5:  GRIEF REACTION (ICD-309.0) sympathy offered - passing of spouse this AM 04/19/10 discussed - increase BZ supplu for  acute period to help calm nerves and sleep as requested  Problem # 6:  GOUT, UNSPECIFIED (ICD-274.9) will provided limited supply oxycodone to try to reduce steroid use with new DM no acute falres but known hx severe dz His updated medication list for this problem includes:    Allopurinol 300 Mg Tabs (Allopurinol) .Marland Kitchen... 1 by mouth once daily    Colcrys 0.6 Mg Tabs (Colchicine) .Marland Kitchen... Take 1 two times a day  chronic tophi - chronic med mgt per rheum but not consistently on colchicine d/t cost steroid shot + oral pred pak - cont oxycodone at home for severe symptoms pain, esp at bedtime   Complete Medication List: 1)  Metoprolol Tartrate 50 Mg Tabs (Metoprolol tartrate) .... Take 1 tablet by mouth two times a day 2)  Requip 4 Mg Tabs (Ropinirole hydrochloride) .... Take 1 tablet by mouth once a day 3)  Remeron 15 Mg Tabs (Mirtazapine) .... At bedtime 4)  Aspirin 81 Mg Tbec (Aspirin) .... Once daily 5)  Plavix 75 Mg Tabs (Clopidogrel bisulfate) .... Take 1 by mouth once daily 6)  Furosemide 20 Mg Tabs (Furosemide) .... Take 1 two times a day 7)  Flurazepam Hcl 30 Mg Caps (Flurazepam hcl) .... Take 1 by mouth once daily 8)  Simvastatin 40 Mg Tabs (Simvastatin) .Marland Kitchen.. 1 by mouth once daily 9)  Vitamin D3 1000 Unit  Tabs (Cholecalciferol) .... Take 1 by mouth once daily 10)  Lorazepam 1 Mg Tabs (Lorazepam) .... Take 1 by mouth three times a day as needed for nerves 11)  Gabapentin 300 Mg Caps (Gabapentin) .Marland Kitchen.. 1 by mouth two times a day 12)  Allopurinol 300 Mg Tabs (Allopurinol) .Marland Kitchen.. 1 by mouth once daily 13)  Colcrys 0.6 Mg Tabs (Colchicine) .... Take 1 two times a day 14)  Nitrostat 0.4 Mg Subl (Nitroglycerin) .... Use as needed 15)  Pepcid Ac 10 Mg Tabs (Famotidine) .... Take 1 by mouth once daily 16)  Oxybutynin Chloride 5 Mg Tabs (Oxybutynin chloride) .Marland Kitchen.. 1 by mouth two times a day 17)  Oxycodone Hcl 5 Mg Tabs (Oxycodone hcl) .Marland Kitchen.. 1 by mouth every 12hoursprn for moderate-severe pain 18)  Freestyle Lite Test Strp (Glucose blood) .... Use check blood sugar once daily  dx 250.00 19)  Freestyle Lancets Misc (Lancets) .... Use as directed  Patient Instructions: 1)  it was good to see you today. 2)  please accept my thougts and prayers for you and your family at this difficult time - 3)  new glucometer to check sugars 2-3x/week - call if sugar over 200, especially after knee shots or steroids 4)  change vesicare to oxybutnin - [facility] 5)  use lorazepam 1-3x/day and oxycontin for pain as discussed - your prescription have been electronically submitted to your pharmacy. Please take as directed. Contact our office if you believe you're having problems with the medication(s).  6)  Please schedule a follow-up appointment in 3 months to recheck diabetes, call sooner if problems.  Prescriptions: FREESTYLE LANCETS  MISC (LANCETS) use as directed  #30 x 5   Entered by:   Orlan Leavens RMA   Authorized by:   Newt Lukes MD   Signed by:   Orlan Leavens RMA on 04/19/2010   Method used:   Electronically to        [facility]* (retail)       [address]       [place], [place]  [address]       Ph: [phone]       Fax: [phone]   RxID:   [id number] FREESTYLE LITE TEST   STRP (GLUCOSE BLOOD) use check Blood sugar once daily  Dx 250.00  #30 x 5   Entered by:   Orlan Leavens RMA   Authorized by:   Newt Lukes MD   Signed by:   Orlan Leavens RMA on 04/19/2010   Method used:   Electronically to        [facility]* (retail)       [address]       [place], [place]  [address]       Ph: [phone]       Fax: [phone]   RxID:   [id number] OXYCODONE HCL 5 MG TABS (OXYCODONE HCL) 1 by mouth every 12hoursprn for moderate-severe pain  #60 x 0   Entered and Authorized by:   Newt Lukes MD   Signed by:   Newt Lukes MD on 04/19/2010   Method used:   Print then Give to Patient   RxID:   [id number] LORAZEPAM 1 MG TABS (LORAZEPAM) take 1 by mouth three times a day as needed for nerves  #90 x 2   Entered and Authorized by:   Newt Lukes MD   Signed by:   Newt Lukes MD on 04/19/2010   Method used:   Print then Give to Patient   RxID:   [id number] OXYBUTYNIN CHLORIDE 5 MG TABS (OXYBUTYNIN CHLORIDE) 1 by mouth two times a day  #180 x 1   Entered and Authorized by:   Newt Lukes MD   Signed by:   Newt Lukes MD on 04/19/2010   Method used:   Electronically to        [facility]* (retail)             ,          Ph: [phone]       Fax: [phone]   RxID:   [id number]    Immunization History:  Influenza Immunization History:    Influenza:  historical given @ hosp (04/14/2010)

## 2010-08-23 NOTE — Assessment & Plan Note (Signed)
Summary: SHORTNESS OF BREATH-LB   Vital Signs:  Patient profile:   77 year old male Height:      67 inches (170.18 cm) Weight:      244.0 pounds (110.91 kg) O2 Sat:      94 % on Room air Temp:     98.3 degrees F (36.83 degrees C) oral Pulse rate:   70 / minute BP sitting:   120 / 62  (left arm) Cuff size:   regular  Vitals Entered By: Orlan Leavens RMA (May 06, 2010 11:30 AM)  O2 Flow:  Room air CC: SOB Is Patient Diabetic? No Pain Assessment Patient in pain? no      Comments Pt states SOB been going on. Denies any chest pain   Primary Care Provider:  [name], MD  CC:  SOB.  History of Present Illness: here for dyspnea onset 3-4 weeks ago following dc from hosp hosp for NSTEMI 03/2010 no intervention done - meds reviewed, no changes since last OV no further CP but progressive PND and DOE assoc with dry cough with any exertion, also mild inc ankle swelling no fever, no sputum no hx CHF per pt  reviewed other chronic issues:  overactive bladder - esp nocturia ineffective vesicare - wants to change to oxybutinin no fever or hematuria  grief reaction - wife passed this AM 04/19/10 following prolonged illness and freq hosp- requests something to help with sleep and nerves  new DM  dx- dx hosp 9/21-25/2011 at admission when cbg high steroid shot in knee for gout 2 days prior to hosp - never on meds for same - no polydispsia or blurred vision- plans to change diet and inc exercise   Clinical Review Panels:  CBC   WBC:  11.2 (04/16/2010)   RBC:  4.23 (04/16/2010)   Hgb:  12.9 (04/16/2010)   Hct:  40.3 (04/16/2010)   Platelets:  141 (04/16/2010)   MCV  95.3 (04/16/2010)   MCHC  34.5 (11/24/2009)   RDW  15.4 (04/16/2010)   PMN:  92 (04/13/2010)   Lymphs:  20.6 (11/24/2009)   Monos:  1 (04/13/2010)   Eosinophils:  0 (04/13/2010)   Basophil:  0 (04/13/2010)  Complete Metabolic Panel   Glucose:  118 (04/17/2010)   Sodium:  137 (04/17/2010)  Potassium:  3.9 (04/17/2010)   Chloride:  101 (04/17/2010)   CO2:  29 (04/17/2010)   BUN:  21 (04/17/2010)   Creatinine:  1.18 (04/17/2010)   Albumin:  3.8 (04/13/2010)   Total Protein:  7.3 (04/13/2010)   Calcium:  8.7 (04/17/2010)   Total Bili:  0.5 (04/13/2010)   Alk Phos:  68 (04/13/2010)   SGPT (ALT):  26 (04/13/2010)   SGOT (AST):  39 (04/13/2010)   Current Medications (verified): 1)  Metoprolol Tartrate 50 Mg Tabs (Metoprolol Tartrate) .... Take 1 Tablet By Mouth Two Times A Day 2)  Requip 4 Mg Tabs (Ropinirole Hydrochloride) .... Take 1 Tablet By Mouth Once A Day 3)  Remeron 15 Mg Tabs (Mirtazapine) .... At Bedtime 4)  Aspirin 81 Mg Tbec (Aspirin) .... Once Daily 5)  Plavix 75 Mg Tabs (Clopidogrel Bisulfate) .... Take 1 By Mouth Once Daily 6)  Furosemide 20 Mg Tabs (Furosemide) .... Take 1 Two Times A Day 7)  Flurazepam Hcl 30 Mg Caps (Flurazepam Hcl) .... Take 1 By Mouth Once Daily 8)  Simvastatin 40 Mg Tabs (Simvastatin) .Marland Kitchen.. 1 By Mouth Once Daily 9)  Vitamin D3 1000 Unit Tabs (Cholecalciferol) .... Take 1 By Mouth  Once Daily 10)  Lorazepam 1 Mg Tabs (Lorazepam) .... Take 1 By Mouth Three Times A Day As Needed For Nerves 11)  Gabapentin 300 Mg Caps (Gabapentin) .Marland Kitchen.. 1 By Mouth Two Times A Day 12)  Allopurinol 300 Mg Tabs (Allopurinol) .Marland Kitchen.. 1 By Mouth Once Daily 13)  Colcrys 0.6 Mg Tabs (Colchicine) .... Take 1 Two Times A Day 14)  Nitrostat 0.4 Mg Subl (Nitroglycerin) .... Use As Needed 15)  Pepcid Ac 10 Mg Tabs (Famotidine) .... Take 1 By Mouth Once Daily 16)  Oxybutynin Chloride 5 Mg Tabs (Oxybutynin Chloride) .Marland Kitchen.. 1 By Mouth Two Times A Day 17)  Oxycodone Hcl 5 Mg Tabs (Oxycodone Hcl) .Marland Kitchen.. 1 By Mouth Every 12hoursprn For Moderate-Severe Pain 18)  Freestyle Lite Test  Strp (Glucose Blood) .... Use Check Blood Sugar Once Daily  Dx 250.00 19)  Freestyle Lancets  Misc (Lancets) .... Use As Directed  Allergies (verified): 1)  ! Lipitor (Atorvastatin Calcium)  Past  History:  Past Medical History: Pericarditis MSSA s/ p pericardial window 2007 Hypertension Hyperlipidemia Arthritis-gouty   Degenerative disc disease    History of Transient Ischemic attack-2005 History of Left Carotid Endarterectomy Myocardial Infarction-2004, 03/2010 Mild sleep apnea Restless Leg Syndrome Retinal detachment-1994 Cataracts Coronary artery disease     GERD Urinary incontinence, urge   MD roster: cards- tilley [facility] - early rheum- s. deveshwar ortho - [facility]  Review of Systems       The patient complains of dyspnea on exertion and peripheral edema.  The patient denies fever, weight loss, chest pain, syncope, headaches, hemoptysis, abdominal pain, severe indigestion/heartburn, and muscle weakness.    Physical Exam  General:  alert, well-developed, well-nourished, and cooperative to examination. dyspnea with conversation Lungs:  diminshed BS L base and crackles R>L lower 1/2 lung - mild inc WOB at rest with conversational dyspnea Heart:  normal rate, regular rhythm, no murmur, and no rub. BLE with trace ankle edema.  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   tearful at times   Impression & Recommendations:  Problem # 1:  DYSPNEA (ICD-786.05) suspect CHF based on recent NSTEMI and progressive symptoms since dc home lung exam with fluid and O2 sats low normal -  inc ankle edema and weight inc 6 lbs since last OV following dc hosp increase diuretics and add Kcl - erx done check cxr and labs to help confirm chf dx and exclude other dz/infx close f/u here 72h to reeval, to go to ER if symptoms worse before that time - pt understands and agrees to same Orders: TLB-CBC Platelet - w/Differential (85025-CBCD) TLB-BMP (Basic Metabolic  Panel-BMET) (80048-METABOL) TLB-BNP (B-Natriuretic Peptide) (83880-BNPR)  Problem # 2:  CHF (ICD-428.0)  His updated medication list for this problem includes:    Metoprolol Tartrate 50 Mg Tabs (Metoprolol tartrate) .Marland Kitchen... Take 1 tablet by mouth two times a day    Aspirin 81 Mg Tbec (Aspirin) ..... Once daily    Plavix 75 Mg Tabs (Clopidogrel bisulfate) .Marland Kitchen... Take 1 by mouth once daily    Furosemide 40 Mg Tabs (Furosemide) .Marland Kitchen... 1 by mouth two times a day (or as directed)  Orders: T-2 View CXR (71020TC) TLB-CBC Platelet - w/Differential (85025-CBCD) TLB-BMP (Basic Metabolic Panel-BMET) (80048-METABOL) TLB-BNP (B-Natriuretic Peptide) (83880-BNPR) Prescription Created Electronically [phone])  Problem # 3:  MYOCARDIAL INFARCTION (ICD-410.90)  His updated medication list for this problem includes:    Metoprolol Tartrate 50 Mg Tabs (Metoprolol tartrate) .Marland Kitchen... Take 1 tablet by mouth two times a day    Aspirin 81 Mg Tbec (Aspirin) ..... Once daily    Plavix 75 Mg Tabs (Clopidogrel bisulfate) .Marland Kitchen... Take 1 by mouth once daily    Furosemide 40 Mg Tabs (Furosemide) .Marland Kitchen... 1 by mouth two times a day (or as directed)    Nitrostat 0.4 Mg Subl (Nitroglycerin) ..... Use as needed  NQWMI 03/2010 - med mgmt rec following cath hosp dc summary and meds again reviewed -  Complete Medication List: 1)  Metoprolol Tartrate 50 Mg Tabs (Metoprolol tartrate) .... Take 1 tablet by mouth two times a day 2)  Requip 4 Mg Tabs (Ropinirole hydrochloride) .... Take 1 tablet by mouth once a day 3)  Remeron 15 Mg Tabs (Mirtazapine) .... At bedtime 4)  Aspirin 81 Mg Tbec (Aspirin) .... Once daily 5)  Plavix 75 Mg Tabs (Clopidogrel bisulfate) .... Take 1 by mouth once daily 6)  Furosemide 40 Mg Tabs (Furosemide) .Marland Kitchen.. 1 by mouth two times a day (or as directed) 7)  Flurazepam Hcl 30 Mg Caps (Flurazepam hcl) .... Take 1 by mouth once daily 8)  Simvastatin 40 Mg Tabs (Simvastatin) .Marland Kitchen.. 1 by mouth once daily 9)  Vitamin D3  1000 Unit Tabs (Cholecalciferol) .... Take 1 by mouth once daily 10)  Lorazepam 1 Mg Tabs (Lorazepam) .... Take 1 by mouth three times a day as needed for nerves 11)  Gabapentin 300 Mg Caps (Gabapentin) .Marland Kitchen.. 1 by mouth two times a day 12)  Allopurinol 300 Mg Tabs (Allopurinol) .Marland Kitchen.. 1 by mouth once daily 13)  Colcrys 0.6 Mg Tabs (Colchicine) .... Take 1 two times a day 14)  Nitrostat 0.4 Mg Subl (Nitroglycerin) .... Use as needed 15)  Pepcid Ac 10 Mg Tabs (Famotidine) .... Take 1 by mouth once daily 16)  Oxybutynin Chloride 5 Mg Tabs (Oxybutynin chloride) .Marland Kitchen.. 1 by mouth two times a day 17)  Oxycodone Hcl 5 Mg Tabs (Oxycodone hcl) .Marland Kitchen.. 1 by mouth every 12hoursprn for moderate-severe pain 18)  Freestyle Lite Test Strp (Glucose blood) .... Use check blood sugar once daily  dx 250.00 19)  Freestyle Lancets Misc (Lancets) .... Use as directed 20)  Klor-con M20 20 Meq Cr-tabs (Potassium chloride crys cr) .Marland Kitchen.. 1 by mouth two times a day (or as directed)  Patient Instructions: 1)  it was good to see you today. 2)  xrays ordered today - your results will be called to you after review 3)  start 2 new medications: furosemide 40mg  and potassium - take one of each pill two times a day until i see you again 4)  your prescriptions have been electronically submitted to your pharmacy. Please take as directed. Contact our office if you believe you're having problems with the medication(s).  5)  Please schedule a follow-up appointment on Monday 10/17 to recheck, call sooner if problems or go to emergency room if worse.  Prescriptions: KLOR-CON M20 20 MEQ CR-TABS (POTASSIUM CHLORIDE CRYS CR) 1 by mouth two times a day (or as directed)  #60 x 1   Entered and Authorized by:   Newt Lukes MD   Signed by:   Newt Lukes MD on 05/06/2010   Method used:   Electronically to        [facility]* (retail)       [address]  [place], [place]  [address]       Ph:  [phone]       Fax: [phone]   RxID:   [id number] FUROSEMIDE 40 MG TABS (FUROSEMIDE) 1 by mouth two times a day (or as directed)  #60 x 1   Entered and Authorized by:   Newt Lukes MD   Signed by:   Newt Lukes MD on 05/06/2010   Method used:   Electronically to        [facility] (retail)       [address]       [place], [place]  [address]       Ph: [phone]       Fax: [phone]   RxID:   [id number]

## 2010-08-23 NOTE — Assessment & Plan Note (Signed)
Summary: PER PT 2 WK FU FROM 09/16/09--STC   Vital Signs:  Patient profile:   77 year old male Height:      67 inches (170.18 cm) Weight:      224.8 pounds (102.18 kg) O2 Sat:      97 % on Room air Temp:     98.3 degrees F (36.83 degrees C) oral Pulse rate:   66 / minute BP sitting:   126 / 72  (left arm) Cuff size:   large  Vitals Entered By: Orlan Leavens (September 29, 2009 10:47 AM)  O2 Flow:  Room air CC: 2 week follow-up/ Pt states since started taking uloric feet has became worse, also have other issues want to discuss Is Patient Diabetic? No Pain Assessment Patient in pain? yes     Location: feet Type: aching   Primary Care Provider:  Newt Lukes MD  CC:  2 week follow-up/ Pt states since started taking uloric feet has became worse and also have other issues want to discuss.  History of Present Illness: here for followup -  severe gout flare-  affects both feet- was seen here by TJ 2 weeks ago for same -on 2/24 added uloric in place of allopurinol - feels feet worse as pain kkeping him from walking - requests rx for lift chair due to this pain and OA pain in hips - pain today 10+/10 in both feet redness and prior infection on feet improved - completed 2 weeks keflex for same  dtr also noted pt with increased urinary incontinence symptoms - pt reports when he feels need to go, it is already too late (urge) ?med for same -   CVA hx - requests lift chair as above -  no new neuro symptoms though occ "slurring words" 1st thing in AM upon waking -  speech clears within minutes -  slurring never occurs once awake during the day or after daytime naps - no other weakness, facial droop or assymetry noted - recent carotids scanned and "clear" per pt/dtr report compliant with meds as rx'd -  Current Medications (verified): 1)  Metoprolol Tartrate 50 Mg Tabs (Metoprolol Tartrate) .... Take 1 Tablet By Mouth Two Times A Day 2)  Lorazepam  Tabs (Lorazepam Tabs) ....  Take 1 Tablet By Mouth Two Times A Day 3)  Requip 4 Mg Tabs (Ropinirole Hydrochloride) .... Take 1 Tablet By Mouth Once A Day 4)  Remeron 15 Mg Tabs (Mirtazapine) .... At Bedtime 5)  Colcrys 0.6 Mg Tabs (Colchicine) .Marland Kitchen.. 1 By Mouth Two Times A Day 6)  Aspirin 81 Mg Tbec (Aspirin) .... Once Daily 7)  Plavix 75 Mg Tabs (Clopidogrel Bisulfate) .... Take 1 By Mouth Qd 8)  Furosemide 20 Mg Tabs (Furosemide) .... Take 1 Two Times A Day 9)  Gabapentin 100 Mg Caps (Gabapentin) .... Take 1 Two Times A Day 10)  Coreg 12.5 Mg Tabs (Carvedilol) .... Take 1 Two Times A Day 11)  Lisinopril 40 Mg Tabs (Lisinopril) .... Take 1 By Mouth Qd 12)  Flurazepam Hcl 30 Mg Caps (Flurazepam Hcl) .... Take 1 By Mouth Qd 13)  Simvastatin 40 Mg Tabs (Simvastatin) .... Take 1 By Mouth Qd 14)  Vitamin D3 1000 Unit Tabs (Cholecalciferol) .... Take 1 By Mouth Qd 15)  Uloric 80 Mg Tabs (Febuxostat) .... One By Mouth Once Daily To Treat Gout/tophi, This Replaces Allopurinol  Allergies (verified): No Known Drug Allergies  Past History:  Past Medical History: Pericarditis MSSA s/ p pericardia  window 2007 Hypertension Hyperlipidemia Arthritis-gouty Degenerative disc disease History of Transient Ischemic attack-2005 History of Left Carotid Endarterectomy Myocardial Infarction-2004 Mild sleep apnea Restless Leg Syndrome Retinal detachment-1994 Cataracts Coronary artery disease GERD Urinary incontinence, urge  MD rooster: cards- tilley [facility] - early rheum- s. deveshwar  Past Surgical History: Reviewed history from 08/30/2009 and no changes required. PTCA/stent (01/2009) pericardial window (2007) Nerve in arm (2008)  Social History: Reviewed history from 09/16/2009 and no changes required. Never Smoked lives with wife (who is paraplegic) and provides her 24/7 care with asst of dtrs Retired Alcohol use-no Drug use-no Regular exercise-no  Review of Systems  The patient denies fever, weight loss,  hoarseness, chest pain, syncope, headaches, and abdominal pain.    Physical Exam  General:  alert, well-developed, well-nourished, and cooperative to examination.   uncomfortable due to foot pain bilaterally - dtr at side Eyes:  vision grossly intact; pupils equal, round and reactive to light.  conjunctiva and lids normal.    Lungs:  normal respiratory effort, no intercostal retractions or use of accessory muscles; normal breath sounds bilaterally - no crackles and no wheezes.    Heart:  normal rate, regular rhythm, no murmur, no gallop, no rub, and no JVD.   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 R ankle and right great toe with acute gouty inflammation and associated edema. there is an abrasion on the top of the left great and little toe toe but the area is non-tender and not swollen and there is no FB. the toenail is intact. the skin over the left foot reveals no erythema, warmth, streaking, induration, fluctuance, vesicles, or induration. distally there is good sensation and capillary refill. Neurologic:  alert & oriented X3 and cranial nerves II-XII symetrically intact.  strength normal in all extremities, sensation intact to light touch, and gait normal. speech fluent without dysarthria or aphasia; follows commands with good comprehension.  Skin:  see Mskel above - otherwise - turgor normal, color normal, no rashes, no suspicious lesions, no ecchymoses, no petechiae, no purpura, no ulcerations, and no edema.   Psych:  Oriented X3, memory intact for recent and remote, normally interactive, good eye contact, not anxious appearing, not depressed appearing, and not agitated.      Impression & Recommendations:  Problem # 1:  GOUTY ARTHRITIS (ICD-274.0) Assessment Deteriorated no relief with urolic trial - stop same and resume allopurinol (renal fx ok) cont branded colchicine - due to severe flare and acute pain, shots of  steroid and  demerol provided today His updated medication list for this problem includes:    Colcrys 0.6 Mg Tabs (Colchicine) .Marland Kitchen... 1 by mouth two times a day    Allopurinol 100 Mg Tabs (Allopurinol) .Marland Kitchen... 1 by mouth three times a day  Problem # 2:  GOUTY TOPHI (ICD-274.82) Assessment: Deteriorated  His updated medication list for this problem includes:    Colcrys 0.6 Mg Tabs (Colchicine) .Marland Kitchen... 1 by mouth two times a day    Allopurinol 100 Mg Tabs (Allopurinol) .Marland Kitchen... 1 by mouth three times a day  Orders: Depo- Medrol 40mg  (J1030) Admin of Therapeutic Inj  intramuscular or subcutaneous (91478) Depo- Medrol 80mg  (J1040) Demerol / Phenergan Injection (G9562)  Problem # 3:  URINARY INCONTINENCE, URGE (ICD-788.31)  trial vesicare - new erx done if good relief of symptoms will send 3 mo rx to [facility]  Orders: Prescription Created Electronically [phone])  Problem # 4:  STROKE (ICD-434.91) no new neuro symptoms  due to difficult mobility with gouty and OA flares and hx CVA, feel lift chair for home use approp - written rx provided His updated medication list for this problem includes:    Aspirin 81 Mg Tbec (Aspirin) ..... Once daily    Plavix 75 Mg Tabs (Clopidogrel bisulfate) .Marland Kitchen... Take 1 by mouth qd  Problem # 5:  DEGENERATIVE JOINT DISEASE (ICD-715.90)  His updated medication list for this problem includes:    Aspirin 81 Mg Tbec (Aspirin) ..... Once daily  Complete Medication List: 1)  Metoprolol Tartrate 50 Mg Tabs (Metoprolol tartrate) .... Take 1 tablet by mouth two times a day 2)  Lorazepam Tabs (Lorazepam tabs) .... Take 1 tablet by mouth two times a day 3)  Requip 4 Mg Tabs (Ropinirole hydrochloride) .... Take 1 tablet by mouth once a day 4)  Remeron 15 Mg Tabs (Mirtazapine) .... At bedtime 5)  Colcrys 0.6 Mg Tabs (Colchicine) .Marland Kitchen.. 1 by mouth two times a day 6)  Aspirin 81 Mg Tbec (Aspirin) .... Once daily 7)  Plavix 75 Mg Tabs (Clopidogrel bisulfate) .... Take 1 by mouth qd 8)   Furosemide 20 Mg Tabs (Furosemide) .... Take 1 two times a day 9)  Gabapentin 100 Mg Caps (Gabapentin) .... Take 1 two times a day 10)  Coreg 12.5 Mg Tabs (Carvedilol) .... Take 1 two times a day 11)  Lisinopril 40 Mg Tabs (Lisinopril) .... Take 1 by mouth qd 12)  Flurazepam Hcl 30 Mg Caps (Flurazepam hcl) .... Take 1 by mouth qd 13)  Simvastatin 40 Mg Tabs (Simvastatin) .... Take 1 by mouth qd 14)  Vitamin D3 1000 Unit Tabs (Cholecalciferol) .... Take 1 by mouth qd 15)  Allopurinol 100 Mg Tabs (Allopurinol) .Marland Kitchen.. 1 by mouth three times a day 16)  Vesicare 5 Mg Tabs (Solifenacin succinate) .Marland Kitchen.. 1 by mouth once daily  Patient Instructions: 1)  it was good to see you today. 2)  stop Uloric - 3)  resume allopurinol three times a day  4)  colcrys should be arriving in mail from [facility] - (brand name for colchicine) 5)  2 shots today for gout flare - demerol and steroids - 6)  new medication for bladder control: vesicare - sent to local pharmacy to try until we know how it works for you - will then send to [facility] as needed  7)  prescription for lift chair as requested (handwritten prescription given) 8)  Please schedule a follow-up appointment as previously scheduled (2-3 months), sooner if problems.  Prescriptions: VESICARE 5 MG TABS (SOLIFENACIN SUCCINATE) 1 by mouth once daily  #30 x 5   Entered and Authorized by:   Newt Lukes MD   Signed by:   Newt Lukes MD on 09/29/2009   Method used:   Electronically to        [facility]* (retail)       [address]       [place], [place]  [address]       Ph: [phone]       Fax: [phone]   RxID:   [id number]    Medication Administration  Injection # 1:    Medication: Demerol / Phenergan Injection    Diagnosis: GOUTY TOPHI (ICD-274.82)    Route: IM    Site: LUOQ gluteus    Exp Date: 05/25/2011    Lot #: [id number]    Mfr: hospira  Comments: Gave 12.5 phenergan    Patient tolerated injection  without complications    Given by: Orlan Leavens (September 29, 2009 11:24 AM)  Injection # 2:    Medication: Depo- Medrol 80mg     Diagnosis: GOUTY TOPHI (ICD-274.82)    Route: IM    Site: RUOQ gluteus    Exp Date: 03/2012    Lot #: Franchot Heidelberg    Mfr: Pharmacia  Injection # 3:    Medication: Depo- Medrol 40mg     Diagnosis: GOUTY TOPHI (ICD-274.82)    Route: IM    Site: RUOQ gluteus    Exp Date: 03/2012    Lot #: Franchot Heidelberg    Mfr: Pharmacia    Patient tolerated injection without complications    Given by: Orlan Leavens (September 29, 2009 11:27 AM)  Orders Added: 1)  Depo- Medrol 40mg  [J1030] 2)  Admin of Therapeutic Inj  intramuscular or subcutaneous [96372] 3)  Depo- Medrol 80mg  [J1040] 4)  Demerol / Phenergan Injection [J2180] 5)  Est. Patient Level V [04540] 6)  Prescription Created Electronically [phone]

## 2010-08-23 NOTE — Miscellaneous (Signed)
Summary: clotest  Clinical Lists Changes  Problems: Added new problem of GASTRITIS (ICD-535.50) Orders: Added new Test order of TLB-H Pylori Screen Gastric Biopsy (83013-CLOTEST) - Signed 

## 2010-08-23 NOTE — Letter (Signed)
Summary: New Patient letter  [facility]  [address] [place], [place] [address]   Phone: [phone]  Fax: [phone]       11/02/2009 MRN: [id number]  [name] [address] [place], [place]  [address]  Dear [name],  Welcome to the Gastroenterology Division at [facility].    You are scheduled to see Dr.  Jarold Motto on 11-30-09 at 1:30pm on the 3rd floor at [facility], [address]  We ask that you try to arrive at our office 15 minutes prior to your appointment time to allow for check-in.  We would like you to complete the enclosed self-administered evaluation form prior to your visit and bring it with you on the day of your appointment.  We will review it with you.  Also, please bring a complete list of all your medications or, if you prefer, bring the medication bottles and we will list them.  Please bring your insurance card so that we may make a copy of it.  If your insurance requires a referral to see a specialist, please bring your referral form from your primary care physician.  Co-payments are due at the time of your visit and may be paid by cash, check or credit card.     Your office visit will consist of a consult with your physician (includes a physical exam), any laboratory testing he/she may order, scheduling of any necessary diagnostic testing (e.g. x-ray, ultrasound, CT-scan), and scheduling of a procedure (e.g. Endoscopy, Colonoscopy) if required.  Please allow enough time on your schedule to allow for any/all of these possibilities.    If you cannot keep your appointment, please call [phone] to cancel or reschedule prior to your appointment date.  This allows [place] the opportunity to schedule an appointment for another patient in need of care.  If you do not cancel or reschedule by 5 p.m. the business day prior to your appointment date, you will be charged a $50.00 late cancellation/no-show fee.    Thank you for choosing   Gastroenterology for your medical needs.  We appreciate the opportunity to care for you.  Please visit [place] at our website  to learn more about our practice.                     Sincerely,                                                             The Gastroenterology Division

## 2010-08-23 NOTE — Procedures (Signed)
Summary: Colonoscopy  Patient: [name] Note: All result statuses are Final unless otherwise noted.  Tests: (1) Colonoscopy (COL)   COL Colonoscopy           DONE     [facility]     [address]     [place], [place]  [address]           COLONOSCOPY PROCEDURE REPORT           PATIENT:  [name], [name]  MR#:  [id number]     BIRTHDATE:  07-01-1933, 76 yrs. old  GENDER:  male     ENDOSCOPIST:  Vania Rea. Jarold Motto, MD, [facility]     REF. BY:     PROCEDURE DATE:  12/10/2009     PROCEDURE:  Average-risk screening colonoscopy     G0121     ASA CLASS:  Class II     INDICATIONS:  Routine Risk Screening     MEDICATIONS:   Fentanyl 50 mcg IV, Versed 5 mg IV           DESCRIPTION OF PROCEDURE:   After the risks benefits and     alternatives of the procedure were thoroughly explained, informed     consent was obtained.  Digital rectal exam was performed and     revealed no abnormalities.   The LB [id number] endoscope     was introduced through the anus and advanced to the cecum, which     was identified by both the appendix and ileocecal valve, without     limitations.  The quality of the prep was excellent, using     MoviPrep.  The instrument was then slowly withdrawn as the colon     was fully examined.     <<PROCEDUREIMAGES>>           FINDINGS:  No polyps or cancers were seen.  Scattered diverticula     were found in the sigmoid colon.   Retroflexed views in the rectum     revealed rectal mass.    The scope was then withdrawn from the     patient and the procedure completed.           COMPLICATIONS:  None     ENDOSCOPIC IMPRESSION:     1) No polyps or cancers     2) Diverticula, scattered in the sigmoid colon     RECOMMENDATIONS:     1) high fiber diet     REPEAT EXAM:  No           ______________________________     Vania Rea. Jarold Motto, MD, Clement[facility] Graham           CC:  Newt Lukes, MD           n.     Rosalie DoctorMarland Kitchen   Vania Rea. Patterson at 12/10/2009 03:32 PM           Benjamine Sprague, [id number]  Note: An exclamation mark (!) indicates a result that was not dispersed into the flowsheet. Document Creation Date: 12/10/2009 3:33 PM _______________________________________________________________________  (1) Order result status: Final Collection or observation date-time: 12/10/2009 15:27 Requested date-time:  Receipt date-time:  Reported date-time:  Referring Physician:   Ordering Physician: Sheryn Bison [phone]) Specimen Source:  Source: Launa Grill Order Number: [id number] Lab site:

## 2010-08-23 NOTE — Letter (Signed)
Summary: [facility] Instructions  [facility]  [address] [place], [place] [address]   Phone: [phone]  Fax: [phone]       OLEY LAHAIE    1933/01/08    MRN: [id number]        Procedure Day /Date: Friday 12/10/09     Arrival Time: 2:30      Procedure Time: 3:30     Location of Procedure:                    _X _   Endoscopy Center (4th Floor)                        PREPARATION FOR COLONOSCOPY WITH MOVIPREP   Starting 5 days prior to your procedure 12/05/09  do not eat nuts, seeds, popcorn, corn, beans, peas,  salads, or any raw vegetables.  Do not take any fiber supplements (e.g. Metamucil, Citrucel, and Benefiber).  THE DAY BEFORE YOUR PROCEDURE         DATE: 12/09/09   DAY: Thursday  1.  Drink clear liquids the entire day-NO SOLID FOOD  2.  Do not drink anything colored red or purple.  Avoid juices with pulp.  No orange juice.  3.  Drink at least 64 oz. (8 glasses) of fluid/clear liquids during the day to prevent dehydration and help the prep work efficiently.  CLEAR LIQUIDS INCLUDE: Water Jello Ice Popsicles Tea (sugar ok, no milk/cream) Powdered fruit flavored drinks Coffee (sugar ok, no milk/cream) Gatorade Juice: apple, white grape, white cranberry  Lemonade Clear bullion, consomm, broth Carbonated beverages (any kind) Strained chicken noodle soup Hard Candy                             4.  In the morning, mix first dose of MoviPrep solution:    Empty 1 Pouch A and 1 Pouch B into the disposable container    Add lukewarm drinking water to the top line of the container. Mix to dissolve    Refrigerate (mixed solution should be used within 24 hrs)  5.  Begin drinking the prep at 5:00 p.m. The MoviPrep container is divided by 4 marks.   Every 15 minutes drink the solution down to the next mark (approximately 8 oz) until the full liter is complete.   6.  Follow completed prep with 16 oz of clear liquid of your choice (Nothing  red or purple).  Continue to drink clear liquids until bedtime.  7.  Before going to bed, mix second dose of MoviPrep solution:    Empty 1 Pouch A and 1 Pouch B into the disposable container    Add lukewarm drinking water to the top line of the container. Mix to dissolve    Refrigerate  THE DAY OF YOUR PROCEDURE      DATE: 12/10/09   DAY: Friday  Beginning at 10:30 a.m. (5 hours before procedure):         1. Every 15 minutes, drink the solution down to the next mark (approx 8 oz) until the full liter is complete.  2. Follow completed prep with 16 oz. of clear liquid of your choice.    3. You may drink clear liquids until 1:30  (2 HOURS BEFORE PROCEDURE).   MEDICATION INSTRUCTIONS  Unless otherwise instructed, you should take regular prescription medications with a small sip of water   as early as possible  the morning of your procedure.    Stop taking Plavix  on  12/05/09  (5 days before procedure).               OTHER INSTRUCTIONS  You will need a responsible adult at least 75 years of age to accompany you and drive you home.   This person must remain in the waiting room during your procedure.  Wear loose fitting clothing that is easily removed.  Leave jewelry and other valuables at home.  However, you may wish to bring a book to read or  an iPod/MP3 player to listen to music as you wait for your procedure to start.  Remove all body piercing jewelry and leave at home.  Total time from sign-in until discharge is approximately 2-3 hours.  You should go home directly after your procedure and rest.  You can resume normal activities the  day after your procedure.  The day of your procedure you should not:   Drive   Make legal decisions   Operate machinery   Drink alcohol   Return to work  You will receive specific instructions about eating, activities and medications before you leave.    The above instructions have been reviewed and explained to me by    _______________________    I fully understand and can verbalize these instructions _____________________________ Date _________

## 2010-08-23 NOTE — Assessment & Plan Note (Signed)
Summary: f/u appt from 10/14/cd   Vital Signs:  Patient profile:   77 year old male Height:      67 inches (170.18 cm) Weight:      245.0 pounds (111.36 kg) O2 Sat:      93 % on Room air Temp:     97.8 degrees F (36.56 degrees C) oral Pulse rate:   72 / minute BP sitting:   128 / 62  (left arm) Cuff size:   regular  Vitals Entered By: Orlan Leavens RMA (May 09, 2010 10:22 AM)  O2 Flow:  Room air CC: 2 day follow-up Is Patient Diabetic? No Pain Assessment Patient in pain? no      Comments Pt states he never started on klor con states pharmacy said they never recieved script. will re-send to [facility]   Primary Care Clorissa Gruenberg:  [name], MD  CC:  2 day follow-up.  History of Present Illness: here for dyspnea onset 3-4 weeks ago following dc from hosp hosp for NSTEMI 03/2010 no intervention done - meds reviewed, no changes since last OV no further CP but progressive PND and DOE assoc with dry cough with any exertion, also mild inc ankle swelling no fever, no sputum no hx CHF per pt increased lasix 72h ago - but pt still taking same dose (confusion re: med appt reviewed)  reviewed other chronic issues: overactive bladder - esp nocturia ineffective vesicare - wants to change to oxybutinin no fever or hematuria  grief reaction - wife passed this AM 04/19/10 following prolonged illness and freq hosp- requests something to help with sleep and nerves  new DM  dx- dx hosp 9/21-25/2011 at admission when cbg high steroid shot in knee for gout 2 days prior to hosp - never on meds for same - no polydispsia or blurred vision- plans to change diet and inc exercise   Clinical Review Panels:  Immunizations   Last Tetanus Booster:  Historical (07/25/2003)   Last Flu Vaccine:  Historical given @ hosp (04/14/2010)   Last Pneumovax:  Historical (07/24/2005)  CBC   WBC:  6.5 (05/06/2010)   RBC:  4.67 (05/06/2010)   Hgb:  14.6 (05/06/2010)   Hct:  43.4  (05/06/2010)   Platelets:  172.0 (05/06/2010)   MCV  93.0 (05/06/2010)   MCHC  33.6 (05/06/2010)   RDW  15.9 (05/06/2010)   PMN:  46.1 (05/06/2010)   Lymphs:  34.0 (05/06/2010)   Monos:  14.0 (05/06/2010)   Eosinophils:  5.6 (05/06/2010)   Basophil:  0.3 (05/06/2010)  Complete Metabolic Panel   Glucose:  114 (05/06/2010)   Sodium:  145 (05/06/2010)   Potassium:  5.3 (05/06/2010)   Chloride:  103 (05/06/2010)   CO2:  31 (05/06/2010)   BUN:  26 (05/06/2010)   Creatinine:  1.5 (05/06/2010)   Albumin:  3.8 (04/13/2010)   Total Protein:  7.3 (04/13/2010)   Calcium:  9.7 (05/06/2010)   Total Bili:  0.5 (04/13/2010)   Alk Phos:  68 (04/13/2010)   SGPT (ALT):  26 (04/13/2010)   SGOT (AST):  39 (04/13/2010)   Current Medications (verified): 1)  Metoprolol Tartrate 50 Mg Tabs (Metoprolol Tartrate) .... Take 1 Tablet By Mouth Two Times A Day 2)  Requip 4 Mg Tabs (Ropinirole Hydrochloride) .... Take 1 Tablet By Mouth Once A Day 3)  Remeron 15 Mg Tabs (Mirtazapine) .... At Bedtime 4)  Aspirin 81 Mg Tbec (Aspirin) .... Once Daily 5)  Plavix 75 Mg Tabs (Clopidogrel Bisulfate) .... Take 1  By Mouth Once Daily 6)  Furosemide 40 Mg Tabs (Furosemide) .Marland Kitchen.. 1 By Mouth Two Times A Day (Or As Directed) 7)  Flurazepam Hcl 30 Mg Caps (Flurazepam Hcl) .... Take 1 By Mouth Once Daily 8)  Simvastatin 40 Mg Tabs (Simvastatin) .Marland Kitchen.. 1 By Mouth Once Daily 9)  Vitamin D3 1000 Unit Tabs (Cholecalciferol) .... Take 1 By Mouth Once Daily 10)  Lorazepam 1 Mg Tabs (Lorazepam) .... Take 1 By Mouth Three Times A Day As Needed For Nerves 11)  Gabapentin 300 Mg Caps (Gabapentin) .Marland Kitchen.. 1 By Mouth Two Times A Day 12)  Allopurinol 300 Mg Tabs (Allopurinol) .Marland Kitchen.. 1 By Mouth Once Daily 13)  Colcrys 0.6 Mg Tabs (Colchicine) .... Take 1 Two Times A Day 14)  Nitrostat 0.4 Mg Subl (Nitroglycerin) .... Use As Needed 15)  Pepcid Ac 10 Mg Tabs (Famotidine) .... Take 1 By Mouth Once Daily 16)  Oxybutynin Chloride 5 Mg Tabs  (Oxybutynin Chloride) .Marland Kitchen.. 1 By Mouth Two Times A Day 17)  Oxycodone Hcl 5 Mg Tabs (Oxycodone Hcl) .Marland Kitchen.. 1 By Mouth Every 12hoursprn For Moderate-Severe Pain 18)  Freestyle Lite Test  Strp (Glucose Blood) .... Use Check Blood Sugar Once Daily  Dx 250.00 19)  Freestyle Lancets  Misc (Lancets) .... Use As Directed 20)  Klor-Con M20 20 Meq Cr-Tabs (Potassium Chloride Crys Cr) .Marland Kitchen.. 1 By Mouth Two Times A Day (Or As Directed)  Allergies (verified): 1)  ! Lipitor (Atorvastatin Calcium)  Past History:  Past Medical History: Pericarditis MSSA s/ p pericardial window 2007 Hypertension   Hyperlipidemia Arthritis-gouty   Degenerative disc disease    History of Transient Ischemic attack-2005 History of Left Carotid Endarterectomy Myocardial Infarction-2004, 03/2010  Mild sleep apnea Restless Leg Syndrome Retinal detachment-1994 Cataracts Coronary artery disease     GERD Urinary incontinence, urge   MD roster: cards- [facility] - [facility]- [facility] - [facility]  Review of Systems  The patient denies fever, syncope, and headaches.    Physical Exam  General:  alert, well-developed, well-nourished, and cooperative to examination. dyspnea with conversation Lungs:  diminshed BS L base and crackles R>L lower 1/2 lung - mild inc WOB at rest with conversational dyspnea Heart:  normal rate, regular rhythm, no murmur, and no rub. BLE with 1+ ankle edema.    Impression & Recommendations:  Problem # 1:  DYSPNEA (ICD-786.05)  suspect CHF based on 03/2010 NSTEMI and progressive symptoms since dc home lung exam unchanged and O2 sats low normal - has not diuresed thus far CXR and labs 10/14 reviewed inc ankle edema and weight inc 7 lbs since OV prior to hosp increase diuretics and  Kcl - erx done close f/u here 72h to reeval if unimproved, to go to ER if symptoms worse before that time - pt understands and agrees to same  Orders: Prescription Created Electronically  [phone])  Problem # 2:  CHF (ICD-428.0)  suspect diastolic - see above His updated medication list for this problem includes:    Metoprolol Tartrate 50 Mg Tabs (Metoprolol tartrate) .Marland Kitchen... Take 1 tablet by mouth two times a day    Aspirin 81 Mg Tbec (Aspirin) ..... Once daily    Plavix 75 Mg Tabs (Clopidogrel bisulfate) .Marland Kitchen... Take 1 by mouth once daily    Furosemide 40 Mg Tabs (Furosemide) .Marland Kitchen... 2 by mouth two times a day x 3 days then return to 1 twice daily (or as directed)  Orders: Prescription Created Electronically [phone])  Complete Medication List: 1)  Metoprolol Tartrate 50 Mg Tabs (Metoprolol tartrate) .... Take 1 tablet by mouth two times a day 2)  Requip 4 Mg Tabs (Ropinirole hydrochloride) .... Take 1 tablet by mouth once a day 3)  Remeron 15 Mg Tabs (Mirtazapine) .... At bedtime 4)  Aspirin 81 Mg Tbec (Aspirin) .... Once daily 5)  Plavix 75 Mg Tabs (Clopidogrel bisulfate) .... Take 1 by mouth once daily 6)  Furosemide 40 Mg Tabs (Furosemide) .... 2 by mouth two times a day x 3 days then return to 1 twice daily (or as directed) 7)  Flurazepam Hcl 30 Mg Caps (Flurazepam hcl) .... Take 1 by mouth once daily 8)  Simvastatin 40 Mg Tabs (Simvastatin) .Marland Kitchen.. 1 by mouth once daily 9)  Vitamin D3 1000 Unit Tabs (Cholecalciferol) .... Take 1 by mouth once daily 10)  Lorazepam 1 Mg Tabs (Lorazepam) .... Take 1 by mouth three times a day as needed for nerves 11)  Gabapentin 300 Mg Caps (Gabapentin) .Marland Kitchen.. 1 by mouth two times a day 12)  Allopurinol 300 Mg Tabs (Allopurinol) .Marland Kitchen.. 1 by mouth once daily 13)  Colcrys 0.6 Mg Tabs (Colchicine) .... Take 1 two times a day 14)  Nitrostat 0.4 Mg Subl (Nitroglycerin) .... Use as needed 15)  Pepcid Ac 10 Mg Tabs (Famotidine) .... Take 1 by mouth once daily 16)  Oxybutynin Chloride 5 Mg Tabs (Oxybutynin chloride) .Marland Kitchen.. 1 by mouth two times a day 17)  Oxycodone Hcl 5 Mg Tabs (Oxycodone hcl) .Marland Kitchen.. 1 by mouth every 12hoursprn for moderate-severe pain 18)   Freestyle Lite Test Strp (Glucose blood) .... Use check blood sugar once daily  dx 250.00 19)  Freestyle Lancets Misc (Lancets) .... Use as directed 20)  Klor-con M20 20 Meq Cr-tabs (Potassium chloride crys cr) .Marland Kitchen.. 1 by mouth two times a day (or as directed)  Patient Instructions: 1)  it was good to see you today. 2)  double up furosemide x 3 days as discussed and start potassium -  3)  weight yourself everyday and call if weight is going up rather than going down 4)  Please schedule a follow-up appointment in 3 days if you are still swollen or if breathing is not better - call sooner if problems or go to emergency room if worse.  Prescriptions: KLOR-CON M20 20 MEQ CR-TABS (POTASSIUM CHLORIDE CRYS CR) 1 by mouth two times a day (or as directed)  #60 x 1   Entered by:   Orlan Leavens RMA   Authorized by:   Newt Lukes MD   Signed by:   Orlan Leavens RMA on 05/09/2010   Method used:   Faxed to ...       [facility]* (retail)       [address]       [place], [place]  [address]       Ph: [phone]       Fax: [phone]   RxID:   [id number]    Orders Added: 1)  Est. Patient Level IV [84132] 2)  Prescription Created Electronically [phone]

## 2010-08-23 NOTE — Progress Notes (Signed)
Summary: RX request  Phone Note Call from Patient Call back at Home Phone [phone]   Caller: Patient Summary of Call: pt called requesting refills of Metoprolol. pt is also requesting alternate for Colchine. Both to [facility]. okay to change and fill? Initial call taken by: Margaret Pyle, CMA,  September 23, 2009 9:48 AM  Follow-up for Phone Call        ok to refill metoprolol - change to colcrys for colchicine - (e-rx not yet sent, but changed on med list) - thanks Follow-up by: Newt Lukes MD,  September 23, 2009 11:12 AM    New/Updated Medications: COLCRYS 0.6 MG TABS (COLCHICINE) 1 by mouth two times a day Prescriptions: COLCRYS 0.6 MG TABS (COLCHICINE) 1 by mouth two times a day  #180 x 3   Entered by:   Margaret Pyle, CMA   Authorized by:   Newt Lukes MD   Signed by:   Margaret Pyle, CMA on 09/23/2009   Method used:   Faxed to ...       [facility]* (mail-order)             ,          Ph: [phone]       Fax: [phone]   RxID:   [id number] METOPROLOL TARTRATE 50 MG TABS (METOPROLOL TARTRATE) Take 1 tablet by mouth two times a day  #180 x 3   Entered by:   Margaret Pyle, CMA   Authorized by:   Newt Lukes MD   Signed by:   Margaret Pyle, CMA on 09/23/2009   Method used:   Faxed to ...       [facility]* (mail-order)             ,          Ph: [phone]       Fax: [phone]   RxID:   [id number]

## 2010-08-23 NOTE — Miscellaneous (Signed)
Summary: PT Eval/[facility]  PT Eval/[facility]   Imported By: Sherian Rein 06/27/2010 12:57:51  _____________________________________________________________________  External Attachment:    Type:   Image     Comment:   External Document

## 2010-08-23 NOTE — Progress Notes (Signed)
Summary: lorazepam  Phone Note Refill Request Message from:  Fax from Pharmacy on January 07, 2010 2:10 PM  Refills Requested: Medication #1:  LORAZEPAM 1 MG TABS take 1 by mouth once daily for nerves # 30   Last Refilled: 11/24/2009 [facility] [phone]   Initial call taken by: Orlan Leavens,  January 07, 2010 2:12 PM  Follow-up for Phone Call        ok to call #30 with 6 refills Follow-up by: Newt Lukes MD,  January 07, 2010 3:38 PM  Additional Follow-up for Phone Call Additional follow up Details #1::        Rx called to pharmacy spoke with Scarlett/pharmacist can only get a total of 6 refill. ok # 30 with 5 addtional. updated emr Additional Follow-up by: Orlan Leavens,  January 07, 2010 4:21 PM    Prescriptions: LORAZEPAM 1 MG TABS (LORAZEPAM) take 1 by mouth once daily for nerves  #30 x 5   Entered by:   Orlan Leavens   Authorized by:   Newt Lukes MD   Signed by:   Orlan Leavens on 01/07/2010   Method used:   Telephoned to ...       [facility]* (retail)       [address]       [place], [place]  [address]       Ph: [phone]       Fax: [phone]   RxID:   [id number]

## 2010-08-23 NOTE — Assessment & Plan Note (Signed)
Summary: ANTIBIOTIC NOT WORKING-ALSO SAW ORTHO/TOLD TO SEE [facility]I CARE MD...   Vital Signs:  Patient profile:   77 year old male Height:      67 inches Weight:      244.75 pounds BMI:     38.47 O2 Sat:      98 % on Room air Temp:     97.5 degrees F oral Pulse rate:   58 / minute Pulse rhythm:   regular Resp:     16 per minute BP sitting:   140 / 70  (left arm)  Vitals Entered By: Lucious Groves (September 16, 2009 1:43 PM)  Nutrition Counseling: Patient's BMI is greater than 25 and therefore counseled on weight management options.  O2 Flow:  Room air CC: RTN visit--C/O antibiotic not working and now pt also has foot injury. Pt dropped a board on his left foot, it is swollen, red, and bloody (dried)./kb Is Patient Diabetic? No Pain Assessment Patient in pain? no        Primary Care Provider:  Newt Lukes MD  CC:  RTN visit--C/O antibiotic not working and now pt also has foot injury. Pt dropped a board on his left foot, it is swollen, red, and and bloody (dried)./kb.  History of Present Illness: He returns with a concern about the top of his left foot. He has taken 2 weeks of Keflex with no improvement. He reports that he saw Dr. Consuella Lose this week and Uric Acid is still high. His plain films from the last visit show tophi and DJD. The new development is that 5 days ago he dropped a piece of wood on the top of his left great toe which caused an abrasion but no pain or swelling.  Preventive Screening-Counseling & Management  Alcohol-Tobacco     Alcohol drinks/day: 0     Smoking Status: never  Caffeine-Diet-Exercise     Does Patient Exercise: no  Hep-HIV-STD-Contraception     Hepatitis Risk: no risk noted     HIV Risk: no risk noted     STD Risk: no risk noted      Drug Use:  no.    Clinical Review Panels:  Diabetes Management   Creatinine:  1.2 (08/30/2009)   Last Pneumovax:  Historical (07/24/2005)  CBC   WBC:  8.4 (08/30/2009)   RBC:  4.88 (08/30/2009)  Hgb:  14.4 (08/30/2009)   Hct:  44.6 (08/30/2009)   Platelets:  166.0 (08/30/2009)   MCV  91.3 (08/30/2009)   MCHC  32.2 (08/30/2009)   RDW  13.3 (08/30/2009)   PMN:  66.2 (08/30/2009)   Lymphs:  17.7 (08/30/2009)   Monos:  12.2 (08/30/2009)   Eosinophils:  3.5 (08/30/2009)   Basophil:  0.4 (08/30/2009)  Complete Metabolic Panel   Glucose:  102 (08/30/2009)   Sodium:  142 (08/30/2009)   Potassium:  4.7 (08/30/2009)   Chloride:  103 (08/30/2009)   CO2:  33 (08/30/2009)   BUN:  16 (08/30/2009)   Creatinine:  1.2 (08/30/2009)   Calcium:  9.5 (08/30/2009)   Medications Prior to Update: 1)  Metoprolol Tartrate 50 Mg Tabs (Metoprolol Tartrate) .... Take 1 Tablet By Mouth Two Times A Day 2)  Lorazepam  Tabs (Lorazepam Tabs) .... Take 1 Tablet By Mouth Two Times A Day 3)  Requip 4 Mg Tabs (Ropinirole Hydrochloride) .... Take 1 Tablet By Mouth Once A Day 4)  Remeron 15 Mg Tabs (Mirtazapine) .... At Bedtime 5)  Colchicine 0.6 Mg Tabs (Colchicine) .... Take 1 Tablet  By Mouth Two Times A Day 6)  Aspirin 81 Mg Tbec (Aspirin) .... Once Daily 7)  Plavix 75 Mg Tabs (Clopidogrel Bisulfate) .... Take 1 By Mouth Qd 8)  Furosemide 20 Mg Tabs (Furosemide) .... Take 1 Two Times A Day 9)  Gabapentin 100 Mg Caps (Gabapentin) .... Take 1 Two Times A Day 10)  Coreg 12.5 Mg Tabs (Carvedilol) .... Take 1 Two Times A Day 11)  Allopurinol 100 Mg Tabs (Allopurinol) .... Take 1 Two Times A Day 12)  Lisinopril 40 Mg Tabs (Lisinopril) .... Take 1 By Mouth Qd 13)  Flurazepam Hcl 30 Mg Caps (Flurazepam Hcl) .... Take 1 By Mouth Qd 14)  Simvastatin 40 Mg Tabs (Simvastatin) .... Take 1 By Mouth Qd 15)  Vitamin D3 1000 Unit Tabs (Cholecalciferol) .... Take 1 By Mouth Qd  Current Medications (verified): 1)  Metoprolol Tartrate 50 Mg Tabs (Metoprolol Tartrate) .... Take 1 Tablet By Mouth Two Times A Day 2)  Lorazepam  Tabs (Lorazepam Tabs) .... Take 1 Tablet By Mouth Two Times A Day 3)  Requip 4 Mg Tabs  (Ropinirole Hydrochloride) .... Take 1 Tablet By Mouth Once A Day 4)  Remeron 15 Mg Tabs (Mirtazapine) .... At Bedtime 5)  Colchicine 0.6 Mg Tabs (Colchicine) .... Take 1 Tablet By Mouth Two Times A Day 6)  Aspirin 81 Mg Tbec (Aspirin) .... Once Daily 7)  Plavix 75 Mg Tabs (Clopidogrel Bisulfate) .... Take 1 By Mouth Qd 8)  Furosemide 20 Mg Tabs (Furosemide) .... Take 1 Two Times A Day 9)  Gabapentin 100 Mg Caps (Gabapentin) .... Take 1 Two Times A Day 10)  Coreg 12.5 Mg Tabs (Carvedilol) .... Take 1 Two Times A Day 11)  Lisinopril 40 Mg Tabs (Lisinopril) .... Take 1 By Mouth Qd 12)  Flurazepam Hcl 30 Mg Caps (Flurazepam Hcl) .... Take 1 By Mouth Qd 13)  Simvastatin 40 Mg Tabs (Simvastatin) .... Take 1 By Mouth Qd 14)  Vitamin D3 1000 Unit Tabs (Cholecalciferol) .... Take 1 By Mouth Qd 15)  Uloric 80 Mg Tabs (Febuxostat) .... One By Mouth Once Daily To Treat Gout/tophi, This Replaces Allopurinol  Allergies (verified): No Known Drug Allergies  Past History:  Past Medical History: Reviewed history from 08/30/2009 and no changes required. Pericarditis MSSA s/ p pericardia window 2007 Hypertension Hyperlipidemia Arthritis-gouty Degenerative disc disease History of Transient Ischemic attack-2005 History of Left Carotid Endarterectomy Myocardial Infarction-2004 Mild sleep apnea Restless Leg Syndrome Retinal detachment-1994 Cataracts Coronary artery disease GERD Urinary incontinence  MD rooster: cards- [facility] - early rheum- deveshwrar  Past Surgical History: Reviewed history from 08/30/2009 and no changes required. PTCA/stent (01/2009) pericardial window (2007) Nerve in arm (2008)  Family History: Reviewed history from 08/30/2009 and no changes required. Family History Diabetes 1st degree relative (parent) Family History of Prostate CA 1st degree relative <50 (other relative) Heart disease (other relative)  Social History: Reviewed history from 08/30/2009 and no  changes required. 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   General:  Denies chills, fatigue, fever, loss of appetite, malaise, sweats, and weight loss. MS:  Complains of joint pain;  denies joint redness, joint swelling, loss of strength, low back pain, muscle aches, cramps, and stiffness.  Physical Exam  General:  alert, well-developed, well-nourished, and cooperative to examination.    Head:  normocephalic, atraumatic, no abnormalities observed, and no abnormalities palpated.   Mouth:  good dentition, pharynx pink and moist, no erythema, no exudates, no posterior lymphoid hypertrophy, and no postnasal drip.   Neck:  thick supple, full ROM, no masses, no thyromegaly; no thyroid nodules or tenderness. no JVD or carotid bruits.   Lungs:  normal respiratory effort, no intercostal retractions or use of accessory muscles; normal breath sounds bilaterally - no crackles and no wheezes.    Heart:  normal rate, regular rhythm, no murmur, no gallop, no rub, and no JVD.   Abdomen:  soft, non-tender, normal bowel sounds, no distention, no masses, no guarding, no rigidity, no hepatomegaly, and no splenomegaly.   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 there is an abrasion on the top of the left great toe but the area is non-tender and not swollen and there is no FB. the toenail is intact. the skin over the left foot reveals no erythema, warmth, streaking, induration, fluctuance, vesicles, or induration. distally there is good sensation and capillary refill. Pulses:  R and L  carotid,radial,femoral,dorsalis pedis and posterior tibial pulses are full and equal bilaterally Extremities:  trace left pedal edema and trace right pedal edema.   Neurologic:  alert & oriented X3 and cranial nerves II-XII symetrically intact.  strength normal in all extremities, sensation intact to light touch, and gait normal. speech fluent without dysarthria or aphasia; follows commands with good comprehension.  Skin:  turgor normal, color normal, no rashes, no suspicious lesions, no ecchymoses, no petechiae, no purpura, no ulcerations, and no edema.   Cervical Nodes:  no anterior cervical adenopathy and no posterior cervical adenopathy.   Psych:  Oriented X3, memory intact for recent and remote, normally interactive, good eye contact, not anxious appearing, not depressed appearing, and not agitated.      Impression & Recommendations:  Problem # 1:  GOUTY TOPHI (ICD-274.82) Assessment Deteriorated  The following medications were removed from the medication list:    Allopurinol 100 Mg Tabs (Allopurinol) .Marland Kitchen... Take 1 two times a day His updated medication list for this problem includes:    Colchicine 0.6 Mg Tabs (Colchicine) .Marland Kitchen... Take 1 tablet by mouth two times a day    Uloric 80 Mg Tabs (Febuxostat) ..... One by mouth once daily to treat gout/tophi, this replaces allopurinol  Elevate extremity; warm compresses, symptomatic relief and medication as directed.   Problem # 2:  HYPERTENSION (ICD-401.9) Assessment: Improved  His updated medication list for this problem includes:    Metoprolol Tartrate 50 Mg Tabs (Metoprolol tartrate) .Marland Kitchen... Take 1 tablet by mouth two times a day    Furosemide 20 Mg Tabs (Furosemide) .Marland Kitchen... Take 1 two times a day    Coreg 12.5 Mg Tabs (Carvedilol) .Marland Kitchen... Take 1 two times a day    Lisinopril 40 Mg Tabs (Lisinopril) .Marland Kitchen... Take 1 by mouth qd  BP today: 140/70 Prior BP: 148/68 (08/30/2009)  Labs Reviewed: K+: 4.7 (08/30/2009) Creat: : 1.2 (08/30/2009)      Complete Medication List: 1)  Metoprolol Tartrate 50 Mg Tabs (Metoprolol tartrate) .... Take 1 tablet by mouth two times a day 2)  Lorazepam Tabs (Lorazepam tabs) .... Take 1 tablet by mouth two times a day  3)  Requip 4 Mg Tabs (Ropinirole hydrochloride) .... Take 1 tablet by mouth once a day 4)  Remeron 15 Mg Tabs (Mirtazapine) .... At bedtime 5)  Colchicine 0.6 Mg Tabs (Colchicine) .... Take 1 tablet by mouth two times a day 6)  Aspirin 81 Mg Tbec (Aspirin) .... Once daily 7)  Plavix 75 Mg Tabs (Clopidogrel bisulfate) .... Take 1 by mouth qd 8)  Furosemide 20 Mg Tabs (Furosemide) .... Take 1 two times a day 9)  Gabapentin 100 Mg Caps (Gabapentin) .... Take 1 two times a day 10)  Coreg 12.5 Mg Tabs (Carvedilol) .... Take 1 two times a day 11)  Lisinopril 40 Mg Tabs (Lisinopril) .... Take 1 by mouth qd 12)  Flurazepam Hcl 30 Mg Caps (Flurazepam hcl) .... Take 1 by mouth qd 13)  Simvastatin 40 Mg Tabs (Simvastatin) .... Take 1 by mouth qd 14)  Vitamin D3 1000 Unit Tabs (Cholecalciferol) .... Take 1 by mouth qd 15)  Uloric 80 Mg Tabs (Febuxostat) .... One by mouth once daily to treat gout/tophi, this replaces allopurinol  Patient Instructions: 1)  Please schedule a follow-up appointment in 2 weeks. 2)  To prevent gout attacks,avoid purine rich foods, such as beer, beans & peas, and meat gravies. Prescriptions: ULORIC 80 MG TABS (FEBUXOSTAT) One by mouth once daily to treat gout/tophi, this replaces Allopurinol  #28 x 0   Entered and Authorized by:   Etta Grandchild MD   Signed by:   Etta Grandchild MD on 09/16/2009   Method used:   Samples Given   RxID:   [id number]

## 2010-08-23 NOTE — Assessment & Plan Note (Signed)
Summary: LOW BP--PER DAH SCHED  STC   Vital Signs:  Patient profile:   77 year old male Height:      67 inches (170.18 cm) Weight:      227.0 pounds (103.18 kg) O2 Sat:      94 % on Room air Temp:     97.1 degrees F (36.17 degrees C) oral Pulse rate:   63 / minute BP sitting:   118 / 58  (left arm) Cuff size:   regular  Vitals Entered By: Orlan Leavens (January 05, 2010 10:51 AM)  O2 Flow:  Room air CC: Low BP Is Patient Diabetic? No Pain Assessment Patient in pain? no        Primary Care Provider:  Otho Najjar, MD  CC:  Low BP.  History of Present Illness: 1) prev diffuse rash has resolved - thought due to allopurinol so med was stopped - now back on w/oproblem also given steroid shot x 2 and taking benadryl- was assoc with severe itch - spreading affected all distal extremites, new involvment on trunk but not face no peeling - no vesicles or blisters no outdoor exposures or travel - no fever not improved with use of benadryl - itch symptoms worse with hot shower and heat outside   3) HTN - still off lisinopril - reports compliance with ongoing medical treatment and no changes in medication dose or frequency. denies adverse side effects related to current therapy.  noted episode of low BP at rheum last week  3) arthritis and gout - back on allopurinol and once daily colchice w/o problems - seeing rheum (deveshwar for same) - pain in all joints chronically but no flares -  Current Medications (verified): 1)  Metoprolol Tartrate 50 Mg Tabs (Metoprolol Tartrate) .... Take 1 Tablet By Mouth Two Times A Day 2)  Requip 4 Mg Tabs (Ropinirole Hydrochloride) .... Take 1 Tablet By Mouth Once A Day 3)  Remeron 15 Mg Tabs (Mirtazapine) .... At Bedtime 4)  Colcrys 0.6 Mg Tabs (Colchicine) .... Take 1 By Mouth Once Daily 5)  Aspirin 81 Mg Tbec (Aspirin) .... Once Daily 6)  Plavix 75 Mg Tabs (Clopidogrel Bisulfate) .... Take 1 By Mouth Once Daily 7)  Furosemide 20 Mg Tabs  (Furosemide) .... Take 1 Two Times A Day - Hold 8)  Flurazepam Hcl 30 Mg Caps (Flurazepam Hcl) .... Take 1 By Mouth Once Daily 9)  Simvastatin 40 Mg Tabs (Simvastatin) .Marland Kitchen.. 1 By Mouth Once Daily 10)  Vitamin D3 1000 Unit Tabs (Cholecalciferol) .... Take 1 By Mouth Once Daily 11)  Vesicare 10 Mg Tabs (Solifenacin Succinate) .Marland Kitchen.. 1 By Mouth Once Daily 12)  Lorazepam 1 Mg Tabs (Lorazepam) .... Take 1 By Mouth Once Daily For Nerves 13)  Gabapentin 300 Mg Caps (Gabapentin) .Marland Kitchen.. 1 By Mouth Two Times A Day 14)  Vistaril 50 Mg Caps (Hydroxyzine Pamoate) .Marland Kitchen.. 1 By Mouth Three Times A Day X 7 Days, Then As Needed For Itch and Rash 15)  Triamcinolone Acetonide 0.025 % Lotn (Triamcinolone Acetonide) .... Apply To Itchy Skin Three Times A Day As Needed 16)  Aciphex 20 Mg Tbec (Rabeprazole Sodium) .Marland Kitchen.. 1 By Mouth Once Daily  Allergies (verified): 1)  ! Lipitor (Atorvastatin Calcium)  Past History:  Past Medical History: Pericarditis MSSA s/ p pericardia window 2007 Hypertension Hyperlipidemia Arthritis-gouty   Degenerative disc disease  History of Transient Ischemic attack-2005 History of Left Carotid Endarterectomy Myocardial Infarction-2004 Mild sleep apnea Restless Leg Syndrome Retinal detachment-1994 Cataracts Coronary artery  disease     GERD Urinary incontinence, urge  MD roster: cards- [facility] - early rheum- s. deveshwar  Review of Systems  The patient denies fever, chest pain, and syncope.    Physical Exam  General:  alert, well-developed, well-nourished, and cooperative to examination.  Lungs:  normal respiratory effort, no intercostal retractions or use of accessory muscles; normal breath sounds bilaterally - no crackles and no wheezes.    Heart:  normal rate, regular rhythm, no murmur, and no rub. BLE without edema.  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   Impression & Recommendations:  Problem # 1:   URTICARIA (ICD-708.9) resolved - etiology unclear - remain off ACEI but now back on allopurinol w/o probelm  Problem # 2:  HYPERTENSION (ICD-401.9)  His updated medication list for this problem includes:    Metoprolol Tartrate 50 Mg Tabs (Metoprolol tartrate) .Marland Kitchen... Take 1 tablet by mouth two times a day    Furosemide 20 Mg Tabs (Furosemide) .Marland Kitchen... Take 1 two times a day - hold  BP today: 118/58 Prior BP: 142/62 (11/30/2009)  Labs Reviewed: K+: 4.5 (11/24/2009) Creat: : 1.3 (11/24/2009)   Chol: 224 (11/01/2009)   HDL: 42.20 (11/01/2009)   TG: 249.0 (11/01/2009)  Problem # 3:  GOUT, UNSPECIFIED (ICD-274.9) no current flares, chronic tophi - med mgt per rheum His updated medication list for this problem includes:    Colcrys 0.6 Mg Tabs (Colchicine) .Marland Kitchen... Take 1 by mouth once daily    Allopurinol 300 Mg Tabs (Allopurinol) .Marland Kitchen... 1 by mouth once daily  Time spent with patient 25 minutes, more than 50% of this time was spent on medication review  Complete Medication List: 1)  Metoprolol Tartrate 50 Mg Tabs (Metoprolol tartrate) .... Take 1 tablet by mouth two times a day 2)  Requip 4 Mg Tabs (Ropinirole hydrochloride) .... Take 1 tablet by mouth once a day 3)  Remeron 15 Mg Tabs (Mirtazapine) .... At bedtime 4)  Colcrys 0.6 Mg Tabs (Colchicine) .... Take 1 by mouth once daily 5)  Aspirin 81 Mg Tbec (Aspirin) .... Once daily 6)  Plavix 75 Mg Tabs (Clopidogrel bisulfate) .... Take 1 by mouth once daily 7)  Furosemide 20 Mg Tabs (Furosemide) .... Take 1 two times a day - hold 8)  Flurazepam Hcl 30 Mg Caps (Flurazepam hcl) .... Take 1 by mouth once daily 9)  Simvastatin 40 Mg Tabs (Simvastatin) .Marland Kitchen.. 1 by mouth once daily 10)  Vitamin D3 1000 Unit Tabs (Cholecalciferol) .... Take 1 by mouth once daily 11)  Vesicare 10 Mg Tabs (Solifenacin succinate) .Marland Kitchen.. 1 by mouth once daily 12)  Lorazepam 1 Mg Tabs (Lorazepam) .... Take 1 by mouth once daily for nerves 13)  Gabapentin 300 Mg Caps  (Gabapentin) .Marland Kitchen.. 1 by mouth two times a day 14)  Vistaril 50 Mg Caps (Hydroxyzine pamoate) .Marland Kitchen.. 1 by mouth three times a day x 7 days, then as needed for itch and rash 15)  Triamcinolone Acetonide 0.025 % Lotn (Triamcinolone acetonide) .... Apply to itchy skin three times a day as needed 16)  Aciphex 20 Mg Tbec (Rabeprazole sodium) .Marland Kitchen.. 1 by mouth once daily 17)  Allopurinol 300 Mg Tabs (Allopurinol) .Marland Kitchen.. 1 by mouth once daily  Patient Instructions: 1)  it was good to see you today. 2)  medications reviewed - no changes from me today - keep doing what you are doing 3)  keep followup as  scheduled previously (or about 3 months), call sooner if problems

## 2010-08-23 NOTE — Assessment & Plan Note (Signed)
Summary: post hosp/bladder infection/pt has hives all over his body-lb   Vital Signs:  Patient profile:   77 year old male Height:      67 inches (170.18 cm) Weight:      230.4 pounds (104.73 kg) O2 Sat:      95 % on Room air Temp:     98.8 degrees F (37.11 degrees C) oral Pulse rate:   120 / minute BP sitting:   162 / 82  (left arm) Cuff size:   regular  Vitals Entered By: Orlan Leavens (November 17, 2009 10:54 AM)  O2 Flow:  Room air CC: Hosp follow-up/ D/C 11/16/09, also have rash all over body Is Patient Diabetic? No Pain Assessment Patient in pain? no        Primary Care Provider:  Newt Lukes MD  CC:  Hosp follow-up/ D/C 11/16/09 and also have rash all over body.  History of Present Illness: here for f/u  1) hosp 4/24-4/26 (just got home last night) re: AMS, ?CVA - MRI/MRA brain - no acute abn AMS then attributed to UTI - has improved with abx -  2) rash - assoc with severe itch - onset 4 days ago (before hospitalization) affects all extremites, largely spares trunk and face no peeling - no vesicles or blisters no outdoor exposures or travel - only new med started recently is vesicare, also resumed allopurinol for his gout few months ago no fever not improved with use of benadryl - itch symptoms worse with hot shower and heat outside  Clinical Review Panels:  Lipid Management   Cholesterol:  224 (11/01/2009)   HDL (good cholesterol):  42.20 (11/01/2009)  CBC   WBC:  6.1 (11/16/2009)   RBC:  4.93 (11/16/2009)   Hgb:  15.2 (11/16/2009)   Hct:  44.1 (11/16/2009)   Platelets:  136 (11/16/2009)   MCV  89.5 (11/16/2009)   MCHC  33.8 (11/01/2009)   RDW  16.1 (11/16/2009)   PMN:  55 (11/16/2009)   Lymphs:  28.0 (11/01/2009)   Monos:  13 (11/16/2009)   Eosinophils:  2 (11/16/2009)   Basophil:  0 (11/16/2009)  Complete Metabolic Panel   Glucose:  102 (11/16/2009)   Sodium:  137 (11/16/2009)   Potassium:  5.3 (11/16/2009)   Chloride:  108  (11/16/2009)   CO2:  23 (11/16/2009)   BUN:  43 (11/16/2009)   Creatinine:  1.65 (11/16/2009)   Albumin:  4.1 (11/01/2009)   Total Protein:  7.7 (11/01/2009)   Calcium:  9.3 (11/16/2009)   Total Bili:  0.7 (11/01/2009)   Alk Phos:  59 (11/01/2009)   SGPT (ALT):  32 (11/01/2009)   SGOT (AST):  32 (11/01/2009)   -  Date:  11/16/2009    BG Random: 102    BG Random: 95    BUN: 43    BUN: 23    Sodium: 137    Sodium: 136    Potassium: 5.3    Potassium: 5.1    Chloride: 108    Chloride: 105    CO2 Total: 23    CO2 Total: 26    Calcium: 9.3    Calcium: 9.3    Creatinine: 1.65    WBC: 6.1    HGB: 15.2    HCT: 44.1    RBC: 4.93    PLT: 136    MCV: 89.5    RDW: 16.1    Neutrophil: 55    Lymphs: 30    Monos: 13    Eos: 2  Basophil: 0  Current Medications (verified): 1)  Metoprolol Tartrate 50 Mg Tabs (Metoprolol Tartrate) .... Take 1 Tablet By Mouth Two Times A Day 2)  Requip 4 Mg Tabs (Ropinirole Hydrochloride) .... Take 1 Tablet By Mouth Once A Day 3)  Remeron 15 Mg Tabs (Mirtazapine) .... At Bedtime 4)  Colcrys 0.6 Mg Tabs (Colchicine) .Marland Kitchen.. 1 By Mouth Two Times A Day 5)  Aspirin 81 Mg Tbec (Aspirin) .... Once Daily 6)  Plavix 75 Mg Tabs (Clopidogrel Bisulfate) .... Take 1 By Mouth Qd 7)  Furosemide 20 Mg Tabs (Furosemide) .... Take 1 Two Times A Day 8)  Coreg 12.5 Mg Tabs (Carvedilol) .... Take 1 Two Times A Day 9)  Lisinopril 40 Mg Tabs (Lisinopril) .... Stop Until Potassium Recheck 10)  Flurazepam Hcl 30 Mg Caps (Flurazepam Hcl) .... Take 1 By Mouth Qd 11)  Simvastatin 40 Mg Tabs (Simvastatin) .Marland Kitchen.. 1 By Mouth Once Daily 12)  Vitamin D3 1000 Unit Tabs (Cholecalciferol) .... Take 1 By Mouth Qd 13)  Allopurinol 300 Mg Tabs (Allopurinol) .Marland Kitchen.. 1 By Mouth Once Daily 14)  Vesicare 10 Mg Tabs (Solifenacin Succinate) .Marland Kitchen.. 1 By Mouth Once Daily 15)  Lorazepam 1 Mg Tabs (Lorazepam) .... Take 1 By Mouth Once Daily For Nerves 16)  Gabapentin 300 Mg Caps (Gabapentin) .Marland Kitchen.. 1  By Mouth Two Times A Day 17)  Cefuroxime Axetil 500 Mg Tabs (Cefuroxime Axetil) .... Take 1 Two Times A Day For 6  Days (On Hold) 18)  Diphenhydramine Hcl 25 Mg Caps (Diphenhydramine Hcl) .... Take 1 Q 8 Hours As Needed  Allergies (verified): 1)  ! Lipitor (Atorvastatin Calcium)  Past History:  Past medical, surgical, family and social histories (including risk factors) reviewed, and no changes noted (except as noted below).  Past Medical History: Pericarditis MSSA s/ p pericardia window 2007 Hypertension Hyperlipidemia Arthritis-gouty   Degenerative disc disease History of Transient Ischemic attack-2005 History of Left Carotid Endarterectomy Myocardial Infarction-2004 Mild sleep apnea Restless Leg Syndrome Retinal detachment-1994 Cataracts Coronary artery disease     GERD Urinary incontinence, urge  MD rooster: cards- [facility] - early rheum- s. deveshwar  Past Surgical History: Reviewed history from 08/30/2009 and no changes required. PTCA/stent (01/2009) pericardial window (2007) Nerve in arm (2008)  Family History: Reviewed history from 08/30/2009 and no changes required. Family History Diabetes 1st degree relative (parent) Family History of Prostate CA 1st degree relative <50 (other relative) Heart disease (other relative)  Social History: Reviewed history from 09/16/2009 and no changes required. 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  The patient denies anorexia, fever, chest pain, dyspnea on exertion, peripheral edema, headaches, and abdominal pain.    Physical Exam  General:  alert, well-developed, well-nourished, and cooperative to examination.  itching constantly Eyes:  vision grossly intact; pupils equal, round and reactive to light.  conjunctiva and lids normal.     Nose:  External nasal examination shows no deformity or inflammation. Nasal mucosa are pink and moist without lesions or exudates. Mouth:  no mucocutaneous lesions or swelling or edema Lungs:  normal respiratory effort, no intercostal retractions or use of accessory muscles; normal breath sounds bilaterally - no crackles and no wheezes.    Heart:  normal rate, regular rhythm, no murmur, and no rub. BLE without edema.  Neurologic:  alert &  oriented X3 and cranial nerves II-XII symetrically intact.  strength normal in all extremities, sensation intact to light touch, and gait normal. speech fluent without dysarthria or aphasia; follows commands with good comprehension.  Skin:  diffuse maculopapular rash affecting all 4 extremites but sparing trunk and face Psych:  Oriented X3, memory intact for recent and remote, normally interactive, good eye contact, not anxious appearing, not depressed appearing, and not agitated.      Impression & Recommendations:  Problem # 1:  UTI (ICD-599.0) recent hosp for same - cont/complete abx as prescribed - assoc AMS is much improved His updated medication list for this problem includes:    Vesicare 10 Mg Tabs (Solifenacin succinate) .Marland Kitchen... 1 by mouth once daily    Cefuroxime Axetil 500 Mg Tabs (Cefuroxime axetil) .Marland Kitchen... Take 1 two times a day for 6  days (on hold)  Problem # 2:  RASH AND OTHER NONSPECIFIC SKIN ERUPTION (ICD-782.1) most likely offending source appears to be allopurinol - stop same now - pt instructed on this and agrees - no mucocutaneous involvment - also consider Plavix as offending med but given CVA hx and recent hosp for eval of ?TIA last 72h, will cont plavix for now tx itch and inflammation with steroids - may cont benadryl -  pt to call if rash or symptoms become worse  Orders: Solumedrol up to 125mg  (Q5956) Admin of Therapeutic Inj  intramuscular or subcutaneous (38756)  Problem # 3:  STROKE (ICD-434.91)  His updated medication list  for this problem includes:    Aspirin 81 Mg Tbec (Aspirin) ..... Once daily    Plavix 75 Mg Tabs (Clopidogrel bisulfate) .Marland Kitchen... Take 1 by mouth qd  no new neuro symptoms  -  recent hosp with MRI/MRA brain reviewed -  hosp for AMS d/t infx (UTI) and not recurrent CVA cont plavix with ASA at this time His updated medication list for this problem includes:    Aspirin 81 Mg Tbec (Aspirin) ..... Once daily    Plavix 75 Mg Tabs (Clopidogrel bisulfate) .Marland Kitchen... Take 1 by mouth qd  Problem # 4:  GOUT, UNSPECIFIED (ICD-274.9) stop allopurinol due to skin eruption x 4days - hx very severe dz flares and followed by rheum for same The following medications were removed from the medication list:    Allopurinol 300 Mg Tabs (Allopurinol) .Marland Kitchen... 1 by mouth once daily His updated medication list for this problem includes:    Colcrys 0.6 Mg Tabs (Colchicine) .Marland Kitchen... 1 by mouth two times a day  09/29/09 note reviewed: no relief with urolic trial - stop same and resume allopurinol (renal fx ok) cont branded colchicine - due to severe flare and acute pain, shots of  steroid and demerol provided today  Problem # 5:  URINARY INCONTINENCE, URGE (ICD-788.31) cont max dose vesicare - reports symptoms improved  Problem # 6:  HYPERTENSION (ICD-401.9)  His updated medication list for this problem includes:    Metoprolol Tartrate 50 Mg Tabs (Metoprolol tartrate) .Marland Kitchen... Take 1 tablet by mouth two times a day    Furosemide 20 Mg Tabs (Furosemide) .Marland Kitchen... Take 1 two times a day    Coreg 12.5 Mg Tabs (Carvedilol) .Marland Kitchen... Take 1 two times a day    Lisinopril 40 Mg Tabs (Lisinopril) ..... Stop until potassium recheck  BP today: 162/82 Prior BP: 110/52 (11/01/2009)  Labs Reviewed: K+: 5.3 (11/16/2009) Creat: : 1.65 (11/16/2009)   Chol: 224 (11/01/2009)   HDL: 42.20 (11/01/2009)   TG: 249.0 (11/01/2009) Time spent with patient  40 minutes, more than 50% of this time was spent reviewing hospitalization, counseling patient on  allopurinol reactions and plans to manage gout symptoms without this med  Complete Medication List: 1)  Metoprolol Tartrate 50 Mg Tabs (Metoprolol tartrate) .... Take 1 tablet by mouth two times a day 2)  Requip 4 Mg Tabs (Ropinirole hydrochloride) .... Take 1 tablet by mouth once a day 3)  Remeron 15 Mg Tabs (Mirtazapine) .... At bedtime 4)  Colcrys 0.6 Mg Tabs (Colchicine) .Marland Kitchen.. 1 by mouth two times a day 5)  Aspirin 81 Mg Tbec (Aspirin) .... Once daily 6)  Plavix 75 Mg Tabs (Clopidogrel bisulfate) .... Take 1 by mouth qd 7)  Furosemide 20 Mg Tabs (Furosemide) .... Take 1 two times a day 8)  Coreg 12.5 Mg Tabs (Carvedilol) .... Take 1 two times a day 9)  Lisinopril 40 Mg Tabs (Lisinopril) .... Stop until potassium recheck 10)  Flurazepam Hcl 30 Mg Caps (Flurazepam hcl) .... Take 1 by mouth qd 11)  Simvastatin 40 Mg Tabs (Simvastatin) .Marland Kitchen.. 1 by mouth once daily 12)  Vitamin D3 1000 Unit Tabs (Cholecalciferol) .... Take 1 by mouth qd 13)  Vesicare 10 Mg Tabs (Solifenacin succinate) .Marland Kitchen.. 1 by mouth once daily 14)  Lorazepam 1 Mg Tabs (Lorazepam) .... Take 1 by mouth once daily for nerves 15)  Gabapentin 300 Mg Caps (Gabapentin) .Marland Kitchen.. 1 by mouth two times a day 16)  Cefuroxime Axetil 500 Mg Tabs (Cefuroxime axetil) .... Take 1 two times a day for 6  days (on hold) 17)  Diphenhydramine Hcl 25 Mg Caps (Diphenhydramine hcl) .... Take 1 q 8 hours as needed  Patient Instructions: 1)  it was good to see you today.  2)  pick up and continue the antibiotics for your bladder infection as prescribed by the hospital -  3)  STOP the allopurinol as this medicine is most likely causing your skin rash - you have been given a steroid shot today to help with the itch - ok to also continue the benadryl as needed - 4)  if the rash gets worse rather than better, or if you start having any rash in your mouth or tongue, go to ER for further treatment 5)  Please schedule a follow-up appointment in 2-3 months for  followup, sooner if problems.    CT Scan  Procedure date:  11/14/2009  Findings:      Exam Type: CT scan of head Impression: unremarkable   MRI EXAM  Procedure date:  11/14/2009  Findings:      Exam Type: MRI, MRA was done which showed no signs of acute infarct, though did note incidental ostial lesion in the posterior circulation. The lesion was describe as 90% stenosis.    Medication Administration  Injection # 1:    Medication: Solumedrol up to 125mg     Diagnosis: RASH AND OTHER NONSPECIFIC SKIN ERUPTION (ICD-782.1)    Route: IM    Site: RUOQ gluteus    Exp Date: 09/2010    Lot #: [id number]    Mfr: [employer]    Patient tolerated injection without complications    Given by: Orlan Leavens (November 17, 2009 11:37 AM)  Orders Added: 1)  Solumedrol up to 125mg  [J2930] 2)  Admin of Therapeutic Inj  intramuscular or subcutaneous [96372] 3)  Est. Patient Level V [30865]

## 2010-08-23 NOTE — Letter (Signed)
Summary: [facility]  [facility]   Imported By: Sherian Rein 09/15/2009 11:29:31  _____________________________________________________________________  External Attachment:    Type:   Image     Comment:   External Document

## 2010-08-23 NOTE — Assessment & Plan Note (Signed)
Summary: RE-CHECK/NWS   Vital Signs:  Patient profile:   77 year old male Height:      67 inches (170.18 cm) Weight:      244.0 pounds (110.91 kg) O2 Sat:      93 % on Room air Temp:     98.0 degrees F (36.67 degrees C) oral Pulse rate:   99 / minute BP sitting:   132 / 70  (left arm) Cuff size:   regular  Vitals Entered By: Orlan Leavens RMA (May 11, 2010 1:24 PM)  O2 Flow:  Room air CC: follow-up visit Is Patient Diabetic? No Pain Assessment Patient in pain? no        Primary Care Takela Varden:  [name], MD  CC:  follow-up visit.  History of Present Illness: here for dyspnea onset >4 weeks ago following dc from hosp hosp for NSTEMI 03/2010 no intervention done -  no further CP but progressive PND and DOE assoc with dry cough with any exertion, also mild inc ankle swelling no fever, no sputum no hx CHF per pt increased lasix 72h ago - but no sig dec in SOB or inc in UOP  reviewed other chronic issues: overactive bladder - esp nocturia ineffective vesicare - wants to change to oxybutinin no fever or hematuria  grief reaction - wife passed this AM 04/19/10 following prolonged illness and freq hosp- requests something to help with sleep and nerves  new DM  dx- dx hosp 9/21-25/2011 at admission when cbg high steroid shot in knee for gout 2 days prior to hosp - never on meds for same - no polydispsia or blurred vision- plans to change diet and inc exercise   Clinical Review Panels:  Immunizations   Last Tetanus Booster:  Historical (07/25/2003)   Last Flu Vaccine:  Historical given @ hosp (04/14/2010)   Last Pneumovax:  Historical (07/24/2005)  Lipid Management   Cholesterol:  187 (04/13/2010)   LDL (bad choesterol):  115 (04/13/2010)   HDL (good cholesterol):  41 (04/13/2010)   Triglycerides:  154 (04/13/2010)  CBC   WBC:  6.5 (05/06/2010)   RBC:  4.67 (05/06/2010)   Hgb:  14.6 (05/06/2010)   Hct:  43.4 (05/06/2010)   Platelets:  172.0  (05/06/2010)   MCV  93.0 (05/06/2010)   MCHC  33.6 (05/06/2010)   RDW  15.9 (05/06/2010)   PMN:  46.1 (05/06/2010)   Lymphs:  34.0 (05/06/2010)   Monos:  14.0 (05/06/2010)   Eosinophils:  5.6 (05/06/2010)   Basophil:  0.3 (05/06/2010)  Complete Metabolic Panel   Glucose:  114 (05/06/2010)   Sodium:  145 (05/06/2010)   Potassium:  5.3 (05/06/2010)   Chloride:  103 (05/06/2010)   CO2:  31 (05/06/2010)   BUN:  26 (05/06/2010)   Creatinine:  1.5 (05/06/2010)   Albumin:  3.8 (04/13/2010)   Total Protein:  7.3 (04/13/2010)   Calcium:  9.7 (05/06/2010)   Total Bili:  0.5 (04/13/2010)   Alk Phos:  68 (04/13/2010)   SGPT (ALT):  26 (04/13/2010)   SGOT (AST):  39 (04/13/2010)   Current Medications (verified): 1)  Metoprolol Tartrate 50 Mg Tabs (Metoprolol Tartrate) .... Take 1 Tablet By Mouth Two Times A Day 2)  Requip 4 Mg Tabs (Ropinirole Hydrochloride) .... Take 1 Tablet By Mouth Once A Day 3)  Remeron 15 Mg Tabs (Mirtazapine) .... At Bedtime 4)  Aspirin 81 Mg Tbec (Aspirin) .... Once Daily 5)  Plavix 75 Mg Tabs (Clopidogrel Bisulfate) .... Take 1 By Mouth  Once Daily 6)  Furosemide 40 Mg Tabs (Furosemide) .... 2 By Mouth Two Times A Day X 3 Days Then Return To 1 Twice Daily (Or As Directed) 7)  Flurazepam Hcl 30 Mg Caps (Flurazepam Hcl) .... Take 1 By Mouth Once Daily 8)  Simvastatin 40 Mg Tabs (Simvastatin) .Marland Kitchen.. 1 By Mouth Once Daily 9)  Vitamin D3 1000 Unit Tabs (Cholecalciferol) .... Take 1 By Mouth Once Daily 10)  Lorazepam 1 Mg Tabs (Lorazepam) .... Take 1 By Mouth Three Times A Day As Needed For Nerves 11)  Gabapentin 300 Mg Caps (Gabapentin) .Marland Kitchen.. 1 By Mouth Two Times A Day 12)  Allopurinol 300 Mg Tabs (Allopurinol) .Marland Kitchen.. 1 By Mouth Once Daily 13)  Colcrys 0.6 Mg Tabs (Colchicine) .... Take 1 Two Times A Day 14)  Nitrostat 0.4 Mg Subl (Nitroglycerin) .... Use As Needed 15)  Pepcid Ac 10 Mg Tabs (Famotidine) .... Take 1 By Mouth Once Daily 16)  Oxybutynin Chloride 5 Mg Tabs  (Oxybutynin Chloride) .Marland Kitchen.. 1 By Mouth Two Times A Day 17)  Oxycodone Hcl 5 Mg Tabs (Oxycodone Hcl) .Marland Kitchen.. 1 By Mouth Every 12hoursprn For Moderate-Severe Pain 18)  Freestyle Lite Test  Strp (Glucose Blood) .... Use Check Blood Sugar Once Daily  Dx 250.00 19)  Freestyle Lancets  Misc (Lancets) .... Use As Directed 20)  Klor-Con M20 20 Meq Cr-Tabs (Potassium Chloride Crys Cr) .Marland Kitchen.. 1 By Mouth Two Times A Day (Or As Directed)  Allergies (verified): 1)  ! Lipitor (Atorvastatin Calcium)  Past History:  Past Medical History: Pericarditis MSSA s/ p pericardial window 2007 Hypertension   Hyperlipidemia  Arthritis-gouty   Degenerative disc disease     History of Transient Ischemic attack-2005 History of Left Carotid Endarterectomy Myocardial Infarction-2004, 03/2010  Mild sleep apnea Restless Leg Syndrome Retinal detachment-1994 Cataracts Coronary artery disease     GERD Urinary incontinence, urge   MD roster: cards- [facility] - early rheum- s. deveshwar ortho - [place]  Review of Systems  The patient denies syncope, headaches, and abdominal pain.    Physical Exam  General:  alert, well-developed, well-nourished, and cooperative to examination. min dyspnea with conversation (improved) Lungs:  diminshed BS L base and crackles R>L lower 1/2 lung - mild inc WOB at rest with conversational dyspnea Heart:  normal rate, regular rhythm, no murmur, and no rub. BLE with 1+ ankle edema (no change)   Impression & Recommendations:  Problem # 1:  DYSPNEA (ICD-786.05)  suspect CHF based on 03/2010 NSTEMI and progressive symptoms since dc home lung exam unchanged and O2 sats low normal - has not significantly diuresed thus far as OP CXR and labs 10/14 reviewed again today weight inc 7 lbs since OV prior to hosp add zaroxyln to current diuretics and  Kcl - erx done; also rx furosemide to [facility] close f/u here 72h to reeval if unimproved, to go to ER if symptoms worse before that time - pt  understands and agrees to same  Orders: Prescription Created Electronically [phone])  Problem # 2:  CHF (ICD-428.0)  diastolic exac  - see dyspnea above add zaroxyln and f/u as needed  His updated medication list for this problem includes:    Metoprolol Tartrate 50 Mg Tabs (Metoprolol tartrate) .Marland Kitchen... Take 1 tablet by mouth two times a day    Aspirin 81 Mg Tbec (Aspirin) ..... Once daily    Plavix 75 Mg Tabs (Clopidogrel bisulfate) .Marland Kitchen... Take 1 by mouth once daily    Furosemide 40 Mg Tabs (Furosemide) .Marland KitchenMarland KitchenMarland KitchenMarland Kitchen  1 by mouth two times a day  (or as directed)    Zaroxolyn 5 Mg Tabs (Metolazone) .Marland Kitchen... 1 by mouth once daily x 5 days, then every other day x 5 days, then as needed fpr swelling  Orders: Prescription Created Electronically [phone])  Complete Medication List: 1)  Metoprolol Tartrate 50 Mg Tabs (Metoprolol tartrate) .... Take 1 tablet by mouth two times a day 2)  Requip 4 Mg Tabs (Ropinirole hydrochloride) .... Take 1 tablet by mouth once a day 3)  Remeron 15 Mg Tabs (Mirtazapine) .... At bedtime 4)  Aspirin 81 Mg Tbec (Aspirin) .... Once daily 5)  Plavix 75 Mg Tabs (Clopidogrel bisulfate) .... Take 1 by mouth once daily 6)  Furosemide 40 Mg Tabs (Furosemide) .Marland Kitchen.. 1 by mouth two times a day  (or as directed) 7)  Flurazepam Hcl 30 Mg Caps (Flurazepam hcl) .... Take 1 by mouth once daily 8)  Simvastatin 40 Mg Tabs (Simvastatin) .Marland Kitchen.. 1 by mouth once daily 9)  Vitamin D3 1000 Unit Tabs (Cholecalciferol) .... Take 1 by mouth once daily 10)  Lorazepam 1 Mg Tabs (Lorazepam) .... Take 1 by mouth three times a day as needed for nerves 11)  Gabapentin 300 Mg Caps (Gabapentin) .Marland Kitchen.. 1 by mouth two times a day 12)  Allopurinol 300 Mg Tabs (Allopurinol) .Marland Kitchen.. 1 by mouth once daily 13)  Nitrostat 0.4 Mg Subl (Nitroglycerin) .... Use as needed 14)  Pepcid Ac 10 Mg Tabs (Famotidine) .... Take 1 by mouth once daily 15)  Oxybutynin Chloride 5 Mg Tabs (Oxybutynin chloride) .Marland Kitchen.. 1 by mouth two times a  day 16)  Oxycodone Hcl 5 Mg Tabs (Oxycodone hcl) .Marland Kitchen.. 1 by mouth every 12hoursprn for moderate-severe pain 17)  Freestyle Lite Test Strp (Glucose blood) .... Use check blood sugar once daily  dx 250.00 18)  Freestyle Lancets Misc (Lancets) .... Use as directed 19)  Klor-con M20 20 Meq Cr-tabs (Potassium chloride crys cr) .Marland Kitchen.. 1 by mouth two times a day (or as directed) 20)  Zaroxolyn 5 Mg Tabs (Metolazone) .Marland Kitchen.. 1 by mouth once daily x 5 days, then every other day x 5 days, then as needed fpr swelling  Patient Instructions: 1)  it was good to see you today. 2)  continue higher dose furosemide and potassium as discussed and start zaroxyln every day x 5 days, then every other day - to increase urine output and improve breathing -  3)  your prescriptions have been electronically submitted to your local pharmacy and [facility]. Please take as directed. Contact our office if you believe you're having problems with the medication(s).  4)  weight yourself everyday and call if weight is going up rather than going down 5)  Please schedule a follow-up appointment in 3 days if you are still swollen or if breathing is not better - call sooner if problems or go to emergency room if worse.  6)  shingles shot - will look into your coverage for this as discussed Prescriptions: FUROSEMIDE 40 MG TABS (FUROSEMIDE) 1 by mouth two times a day  (or as directed)  #180 x 3   Entered and Authorized by:   Newt Lukes MD   Signed by:   Newt Lukes MD on 05/11/2010   Method used:   Electronically to        [facility]* (retail)             ,          Ph: [phone]  Fax: [phone]   RxID:   [id number] ZAROXOLYN 5 MG TABS (METOLAZONE) 1 by mouth once daily x 5 days, then every other day x 5 days, then as needed fpr swelling  #30 x 0   Entered and Authorized by:   Newt Lukes MD   Signed by:   Newt Lukes MD on 05/11/2010   Method used:   Electronically to        [facility]*  (retail)       [address]       [place], [place]  [address]       Ph: [phone]       Fax: [phone]   RxID:   [id number]    Orders Added: 1)  Est. Patient Level IV [56433] 2)  Prescription Created Electronically [phone]

## 2010-08-23 NOTE — Assessment & Plan Note (Signed)
Summary: deep chest cough/no fever/#/cd   Vital Signs:  Patient profile:   77 year old male Height:      67 inches (170.18 cm) Weight:      224 pounds (101.82 kg) O2 Sat:      94 % on Room air Temp:     98.5 degrees F (36.94 degrees C) oral Pulse rate:   67 / minute BP sitting:   112 / 62  (left arm) Cuff size:   large  Vitals Entered By: Orlan Leavens (October 05, 2009 10:00 AM)  O2 Flow:  Room air CC: Chest congestion, URI symptoms Is Patient Diabetic? No Pain Assessment Patient in pain? no        Primary Care Provider:  Newt Lukes MD  CC:  Chest congestion and URI symptoms.  History of Present Illness:  URI Symptoms      This is a 77 year old man who presents with URI symptoms.  The symptoms began 4 days ago.  The severity is described as moderate.  yellow sputum - cough and congestion esp bad at night.  The patient reports nasal congestion and productive cough, but denies clear nasal discharge, purulent nasal discharge, sore throat, dry cough, earache, and sick contacts.  Associated symptoms include low-grade fever (<100.5 degrees) and wheezing.  The patient denies dyspnea, rash, vomiting, and diarrhea.  The patient also reports seasonal symptoms.  The patient denies sneezing, headache, muscle aches, and severe fatigue.  The patient denies the following risk factors for Strep sinusitis: tooth pain, Strep exposure, and tender adenopathy.    Current Medications (verified): 1)  Metoprolol Tartrate 50 Mg Tabs (Metoprolol Tartrate) .... Take 1 Tablet By Mouth Two Times A Day 2)  Lorazepam  Tabs (Lorazepam Tabs) .... Take 1 Tablet By Mouth Two Times A Day 3)  Requip 4 Mg Tabs (Ropinirole Hydrochloride) .... Take 1 Tablet By Mouth Once A Day 4)  Remeron 15 Mg Tabs (Mirtazapine) .... At Bedtime 5)  Colcrys 0.6 Mg Tabs (Colchicine) .Marland Kitchen.. 1 By Mouth Two Times A Day 6)  Aspirin 81 Mg Tbec (Aspirin) .... Once Daily 7)  Plavix 75 Mg Tabs (Clopidogrel Bisulfate) .... Take 1 By Mouth  Qd 8)  Furosemide 20 Mg Tabs (Furosemide) .... Take 1 Two Times A Day 9)  Gabapentin 100 Mg Caps (Gabapentin) .... Take 1 Two Times A Day 10)  Coreg 12.5 Mg Tabs (Carvedilol) .... Take 1 Two Times A Day 11)  Lisinopril 40 Mg Tabs (Lisinopril) .... Take 1 By Mouth Qd 12)  Flurazepam Hcl 30 Mg Caps (Flurazepam Hcl) .... Take 1 By Mouth Qd 13)  Simvastatin 40 Mg Tabs (Simvastatin) .... Take 1 By Mouth Qd 14)  Vitamin D3 1000 Unit Tabs (Cholecalciferol) .... Take 1 By Mouth Qd 15)  Allopurinol 100 Mg Tabs (Allopurinol) .Marland Kitchen.. 1 By Mouth Three Times A Day 16)  Vesicare 5 Mg Tabs (Solifenacin Succinate) .Marland Kitchen.. 1 By Mouth Once Daily  Allergies (verified): No Known Drug Allergies  Past History:  Past Medical History: Pericarditis MSSA s/ p pericardia window 2007 Hypertension Hyperlipidemia Arthritis-gouty Degenerative disc disease History of Transient Ischemic attack-2005 History of Left Carotid Endarterectomy Myocardial Infarction-2004 Mild sleep apnea Restless Leg Syndrome Retinal detachment-1994 Cataracts Coronary artery disease   GERD Urinary incontinence, urge  MD rooster: cards- tilley VVS - early rheum- s. deveshwar  Review of Systems  The patient denies anorexia, chest pain, syncope, and peripheral edema.         gout much improved, esp left  but still residual pain in right foot.  Physical Exam  General:  alert, well-developed, well-nourished, and cooperative to examination.   coughing and mildly ill appearing Eyes:  vision grossly intact; pupils equal, round and reactive to light.  conjunctiva and lids normal.    Ears:  normal pinnae bilaterally, without erythema, swelling, or tenderness to palpation. TMs clear, without effusion, or cerumen impaction. Hearing grossly normal bilaterally  Mouth:  teeth and gums in good repair; mucous membranes moist, without lesions or ulcers. oropharynx clear without exudate, mod erythema. +yellow PND Lungs:  mod air mvmt with exp wheeze  bilaterally - no inc WOB at rest but +recovery post cough spell - no crackles Heart:  normal rate, regular rhythm, no murmur, and no rub. BLE without edema.   Impression & Recommendations:  Problem # 1:  ACUTE BRONCHITIS (ICD-466.0)  Zpack and steroids (shot today) for wheeze may cont tylenol, benadryl and robitussin as needed for assoc symptoms   Take antibiotics and other medications as directed. Encouraged to push clear liquids, get enough rest, and take acetaminophen as needed. To be seen in 5-7 days if no improvement, sooner if worse.  His updated medication list for this problem includes:    Azithromycin 250 Mg Tabs (Azithromycin) .Marland Kitchen... 2 tabs by mouth today, then 1 by mouth daily starting tomorrow  Orders: Depo- Medrol 80mg  (J1040) Depo- Medrol 40mg  (J1030) Admin of Therapeutic Inj  intramuscular or subcutaneous (91478) Prescription Created Electronically [phone])  Complete Medication List: 1)  Metoprolol Tartrate 50 Mg Tabs (Metoprolol tartrate) .... Take 1 tablet by mouth two times a day 2)  Lorazepam Tabs (Lorazepam tabs) .... Take 1 tablet by mouth two times a day 3)  Requip 4 Mg Tabs (Ropinirole hydrochloride) .... Take 1 tablet by mouth once a day 4)  Remeron 15 Mg Tabs (Mirtazapine) .... At bedtime 5)  Colcrys 0.6 Mg Tabs (Colchicine) .Marland Kitchen.. 1 by mouth two times a day 6)  Aspirin 81 Mg Tbec (Aspirin) .... Once daily 7)  Plavix 75 Mg Tabs (Clopidogrel bisulfate) .... Take 1 by mouth qd 8)  Furosemide 20 Mg Tabs (Furosemide) .... Take 1 two times a day 9)  Gabapentin 100 Mg Caps (Gabapentin) .... Take 1 two times a day 10)  Coreg 12.5 Mg Tabs (Carvedilol) .... Take 1 two times a day 11)  Lisinopril 40 Mg Tabs (Lisinopril) .... Take 1 by mouth qd 12)  Flurazepam Hcl 30 Mg Caps (Flurazepam hcl) .... Take 1 by mouth qd 13)  Simvastatin 40 Mg Tabs (Simvastatin) .... Take 1 by mouth qd 14)  Vitamin D3 1000 Unit Tabs (Cholecalciferol) .... Take 1 by mouth qd 15)  Allopurinol  100 Mg Tabs (Allopurinol) .Marland Kitchen.. 1 by mouth three times a day 16)  Vesicare 5 Mg Tabs (Solifenacin succinate) .Marland Kitchen.. 1 by mouth once daily 17)  Azithromycin 250 Mg Tabs (Azithromycin) .... 2 tabs by mouth today, then 1 by mouth daily starting tomorrow  Patient Instructions: 1)  it was good to see you today. 2)  Zpack antibiotics for your bronchitis and shot for wheeze in office today - your prescription has been electronically submitted to your pharmacy. Please take as directed. Contact our office if you believe you're having problems with the medication(s).  3)  may continue benadryl and robitussin and tylenol as needed for other symptoms  4)  Get plenty of rest, drink lots of clear liquids, and use Tylenol or Ibuprofen for fever and comfort. Return in 7-10 days if you're not better:sooner if  you're feeling worse. Prescriptions: AZITHROMYCIN 250 MG TABS (AZITHROMYCIN) 2 tabs by mouth today, then 1 by mouth daily starting tomorrow  #6 x 0   Entered and Authorized by:   Newt Lukes MD   Signed by:   Newt Lukes MD on 10/05/2009   Method used:   Electronically to        [facility] (retail)       [address]       [place], [place]  [address]       Ph: [phone]       Fax: [phone]   RxID:   [id number]    Medication Administration  Injection # 1:    Medication: Depo- Medrol 80mg     Diagnosis: ACUTE BRONCHITIS (ICD-466.0)    Route: IM    Site: RUOQ gluteus    Exp Date: 03/2012    Lot #: Franchot Heidelberg    Mfr: [employer]    Patient tolerated injection without complications    Given by: Orlan Leavens (October 05, 2009 10:33 AM)  Injection # 2:    Medication: Depo- Medrol 40mg     Diagnosis: ACUTE BRONCHITIS (ICD-466.0)    Route: IM    Site: RUOQ gluteus    Exp Date: 03/2012    Lot #: Franchot Heidelberg    Mfr: [employer]  Orders Added: 1)  Depo- Medrol 80mg  [J1040] 2)  Depo- Medrol 40mg  [J1030] 3)  Admin of Therapeutic Inj  intramuscular or subcutaneous  [96372] 4)  Est. Patient Level IV [25956] 5)  Prescription Created Electronically [phone]

## 2010-08-23 NOTE — Letter (Signed)
Summary: [facility]  [facility]   Imported By: Sherian Rein 06/01/2010 09:57:13  _____________________________________________________________________  External Attachment:    Type:   Image     Comment:   External Document

## 2010-08-23 NOTE — Cardiovascular Report (Signed)
Summary: [facility]  [place]   Imported By: Sherian Rein 04/22/2010 11:30:25  _____________________________________________________________________  External Attachment:    Type:   Image     Comment:   External Document

## 2010-08-23 NOTE — Assessment & Plan Note (Signed)
Summary: 6 wk f/u #/cd   Vital Signs:  Patient profile:   77 year old [name] Height:      67 inches (170.18 cm) Weight:      240.8 pounds (109.45 kg) O2 Sat:      93 % on Room air Temp:     98.6 degrees F (37.00 degrees C) oral Pulse rate:   69 / minute BP sitting:   120 / 60  (left arm) Cuff size:   regular  Vitals Entered By: Orlan Leavens RMA (June 27, 2010 10:28 AM)  O2 Flow:  Room air CC: 6 week follow-up Is Patient Diabetic? Yes Did you bring your meter with you today? No Pain Assessment Patient in pain? no        Primary Care Provider:  [name], MD  CC:  6 week follow-up.  History of Present Illness: here for f/u dyspnea - improved onset 03/2010 ago following dc from hosp for NSTEMI  no intervention done -   c/o LBP and B buttock pain - not using narcotics but would like PT or brace to help support his back and hips no falls or weakness  overactive bladder - esp nocturia ineffective vesicare - wants to change to oxybutinin no fever or hematuria  grief reaction - wife passed 04/19/10 following prolonged illness and freq hosp- requested something to help with sleep and nerves symptoms improving slowly over time  new DM dx- dx hosp 92011 at admission when cbg high steroid shot in knee for gout 2 days prior to hosp - never on meds for same - no polydispsia or blurred vision- plans to change diet and inc exercise   Clinical Review Panels:  Immunizations   Last Tetanus Booster:  Historical (07/25/2003)   Last Flu Vaccine:  Historical given @ hosp (04/14/2010)   Last Pneumovax:  Historical (07/24/2005)  Lipid Management   Cholesterol:  187 (04/13/2010)   LDL (bad choesterol):  115 (04/13/2010)   HDL (good cholesterol):  41 (04/13/2010)   Triglycerides:  154 (04/13/2010)  Diabetes Management   HgBA1C:  6.2 (04/16/2010)   Creatinine:  2.235 (05/20/2010)   Last Flu Vaccine:  Historical given @ hosp (04/14/2010)   Last Pneumovax:  Historical  (07/24/2005)  CBC   WBC:  11.1 (05/20/2010)   RBC:  5.06 (05/20/2010)   Hgb:  15.0 (05/20/2010)   Hct:  47.1 (05/20/2010)   Platelets:  226 (05/20/2010)   MCV  93.1 (05/20/2010)   MCHC  33.6 (05/06/2010)   RDW  14.7 (05/20/2010)   PMN:  71 (05/20/2010)   Lymphs:  34.0 (05/06/2010)   Monos:  11 (05/20/2010)   Eosinophils:  2 (05/20/2010)   Basophil:  0.3 (05/06/2010)  Complete Metabolic Panel   Glucose:  139 (05/20/2010)   Sodium:  139 (05/20/2010)   Potassium:  4.1 (05/20/2010)   Chloride:  90 (05/20/2010)   CO2:  35 (05/20/2010)   BUN:  78 (05/20/2010)   Creatinine:  2.235 (05/20/2010)   Albumin:  4.3 (05/20/2010)   Total Protein:  7.7 (05/20/2010)   Calcium:  9.9 (05/20/2010)   Total Bili:  0.8 (05/20/2010)   Alk Phos:  86 (05/20/2010)   SGPT (ALT):  15 (05/20/2010)   SGOT (AST):  25 (05/20/2010)   Current Medications (verified): 1)  Metoprolol Tartrate 50 Mg Tabs (Metoprolol Tartrate) .... Take 1 Tablet By Mouth Two Times A Day 2)  Requip 4 Mg Tabs (Ropinirole Hydrochloride) .... Take 1 Tablet By Mouth Once A Day 3)  Remeron 15 Mg Tabs (Mirtazapine) .... At Bedtime 4)  Aspirin 81 Mg Tbec (Aspirin) .... Once Daily 5)  Plavix 75 Mg Tabs (Clopidogrel Bisulfate) .... Take 1 By Mouth Once Daily 6)  Furosemide 40 Mg Tabs (Furosemide) .Marland Kitchen.. 1 By Mouth Two Times A Day  (Or As Directed) 7)  Flurazepam Hcl 30 Mg Caps (Flurazepam Hcl) .... Take 1 By Mouth Once Daily 8)  Simvastatin 40 Mg Tabs (Simvastatin) .Marland Kitchen.. 1 By Mouth Once Daily 9)  Vitamin D3 1000 Unit Tabs (Cholecalciferol) .... Take 1 By Mouth Once Daily 10)  Lorazepam 1 Mg Tabs (Lorazepam) .... Take 1 By Mouth Three Times A Day As Needed For Nerves 11)  Gabapentin 300 Mg Caps (Gabapentin) .Marland Kitchen.. 1 By Mouth Two Times A Day 12)  Allopurinol 300 Mg Tabs (Allopurinol) .Marland Kitchen.. 1 By Mouth Once Daily 13)  Nitrostat 0.4 Mg Subl (Nitroglycerin) .... Use As Needed 14)  Pepcid Ac 10 Mg Tabs (Famotidine) .... Take 1 By Mouth Once  Daily 15)  Oxybutynin Chloride 5 Mg Tabs (Oxybutynin Chloride) .Marland Kitchen.. 1 By Mouth Two Times A Day 16)  Oxycodone Hcl 5 Mg Tabs (Oxycodone Hcl) .Marland Kitchen.. 1 By Mouth Every 12hoursprn For Moderate-Severe Pain 17)  Klor-Con M20 20 Meq Cr-Tabs (Potassium Chloride Crys Cr) .Marland Kitchen.. 1 By Mouth Two Times A Day (Or As Directed) 18)  Zaroxolyn 5 Mg Tabs (Metolazone) .Marland Kitchen.. 1 By Mouth Once Daily X 5 Days, Then Every Other Day X 5 Days, Then As Needed Fpr Swelling 19)  Polyethylene Glycol 3350  Powd (Polyethylene Glycol 3350) .Marland Kitchen.. 1 Scoop in [employer] Two Times A Day  Allergies (verified): 1)  ! Lipitor (Atorvastatin Calcium)  Past History:  Past Medical History: Pericarditis MSSA s/ p pericardial window 2007  Hypertension   Hyperlipidemia  Arthritis-gouty   Degenerative disc disease     History of Transient Ischemic attack-2005 History of Left Carotid Endarterectomy Myocardial Infarction-2004, 03/2010  Mild sleep apnea Restless Leg Syndrome Retinal detachment-1994 Cataracts Coronary artery disease     GERD Urinary incontinence, urge   MD roster: cards- tilley  VVS - early rheum- s. deveshwar ortho - [place]  Review of Systems  The patient denies fever, weight gain, chest pain, and headaches.         c/o mouth and tounge pain with eating  Physical Exam  General:  alert, well-developed, well-nourished, and cooperative to examination. Mouth:  no mucocutaneous lesions or swelling or edema - thrush changes on tongue Lungs:  diminshed BS B lower 1/2 lung - no inc WOB at rest and no conversational dyspnea Heart:  normal rate, regular rhythm, no murmur, and no rub. BLE with 1+ ankle edema (no change) Psych:  reserved but Oriented X3, memory intact for recent and remote, fair eye contact, not anxious appearing, approp depressed appearing, and not agitated.      Impression & Recommendations:  Problem # 1:  THRUSH (ICD-112.0)  nystatin - erx done  Orders: Prescription Created Electronically  [phone])  Problem # 2:  CORONARY ARTERY DISEASE (ICD-414.00)  s/p NQWMI 03/2010 - med mgmt - no anginal symptoms at this time The following medications were removed from the medication list:    Zaroxolyn 5 Mg Tabs (Metolazone) .Marland Kitchen... 1 by mouth once daily x 5 days, then every other day x 5 days, then as needed fpr swelling His updated medication list for this problem includes:    Metoprolol Tartrate 50 Mg Tabs (Metoprolol tartrate) .Marland Kitchen... Take 1 tablet by mouth two times a day  Aspirin 81 Mg Tbec (Aspirin) ..... Once daily    Plavix 75 Mg Tabs (Clopidogrel bisulfate) .Marland Kitchen... Take 1 by mouth once daily    Furosemide 40 Mg Tabs (Furosemide) .Marland Kitchen... 1 by mouth two times a day  (or as directed)    Nitrostat 0.4 Mg Subl (Nitroglycerin) ..... Use as needed  Labs Reviewed: Chol: 187 (04/13/2010)   HDL: 41 (04/13/2010)   LDL: 115 (04/13/2010)   TG: 154 (04/13/2010)  Problem # 3:  CHF (ICD-428.0)  The following medications were removed from the medication list:    Zaroxolyn 5 Mg Tabs (Metolazone) .Marland Kitchen... 1 by mouth once daily x 5 days, then every other day x 5 days, then as needed fpr swelling His updated medication list for this problem includes:    Metoprolol Tartrate 50 Mg Tabs (Metoprolol tartrate) .Marland Kitchen... Take 1 tablet by mouth two times a day    Aspirin 81 Mg Tbec (Aspirin) ..... Once daily    Plavix 75 Mg Tabs (Clopidogrel bisulfate) .Marland Kitchen... Take 1 by mouth once daily    Furosemide 40 Mg Tabs (Furosemide) .Marland Kitchen... 1 by mouth two times a day  (or as directed)  chronic diastolic - compenstated at this time  Problem # 4:  ANXIETY STATE, UNSPECIFIED (ICD-300.00) reviewed pt's BZ use - inc lorazepam was intented to be temporary related to spouse's death 2010-05-03 - i declines inc dose for nerves - cont same and consider behav therapy or alt med if cont problems in future His updated medication list for this problem includes:    Remeron 15 Mg Tabs (Mirtazapine) .Marland Kitchen... At bedtime    Lorazepam 1 Mg Tabs  (Lorazepam) .Marland Kitchen... Take 1 by mouth three times a day as needed for nerves  Complete Medication List: 1)  Metoprolol Tartrate 50 Mg Tabs (Metoprolol tartrate) .... Take 1 tablet by mouth two times a day 2)  Requip 4 Mg Tabs (Ropinirole hydrochloride) .... Take 1 tablet by mouth once a day 3)  Remeron 15 Mg Tabs (Mirtazapine) .... At bedtime 4)  Aspirin 81 Mg Tbec (Aspirin) .... Once daily 5)  Plavix 75 Mg Tabs (Clopidogrel bisulfate) .... Take 1 by mouth once daily 6)  Furosemide 40 Mg Tabs (Furosemide) .Marland Kitchen.. 1 by mouth two times a day  (or as directed) 7)  Flurazepam Hcl 30 Mg Caps (Flurazepam hcl) .... Take 1 by mouth once daily 8)  Simvastatin 40 Mg Tabs (Simvastatin) .Marland Kitchen.. 1 by mouth once daily 9)  Vitamin D3 1000 Unit Tabs (Cholecalciferol) .... Take 1 by mouth once daily 10)  Lorazepam 1 Mg Tabs (Lorazepam) .... Take 1 by mouth three times a day as needed for nerves 11)  Gabapentin 300 Mg Caps (Gabapentin) .Marland Kitchen.. 1 by mouth two times a day 12)  Allopurinol 300 Mg Tabs (Allopurinol) .Marland Kitchen.. 1 by mouth once daily 13)  Nitrostat 0.4 Mg Subl (Nitroglycerin) .... Use as needed 14)  Pepcid Ac 10 Mg Tabs (Famotidine) .... Take 1 by mouth once daily 15)  Oxybutynin Chloride 5 Mg Tabs (Oxybutynin chloride) .Marland Kitchen.. 1 by mouth two times a day 16)  Oxycodone Hcl 5 Mg Tabs (Oxycodone hcl) .Marland Kitchen.. 1 by mouth every 12hoursprn for moderate-severe pain 17)  Klor-con M20 20 Meq Cr-tabs (Potassium chloride crys cr) .Marland Kitchen.. 1 by mouth two times a day (or as directed) 18)  Polyethylene Glycol 3350 Powd (Polyethylene glycol 3350) .Marland Kitchen.. 1 scoop in 17oz water two times a day 19)  Nystatin 100000 Unit/ml Susp (Nystatin) .Marland Kitchen.. 10cc by mouth swish, gargle and spit four times  a day  Patient Instructions: 1)  it was good to see you today. 2)  nystatin for your mouth and tongue; also refill on lorazepam - your prescriptions have been electronically submitted or faxed to your pharmacy. Please take as directed. Contact our office if you  believe you're having problems with the medication(s).  3)  Please schedule a follow-up appointment in 6 weeks to review breathing, diabetes, gout and back pain; call sooner if problems.  will check blood at that visit (a1c, kidneys, potassium) Prescriptions: LORAZEPAM 1 MG TABS (LORAZEPAM) take 1 by mouth three times a day as needed for nerves  #90 x 2   Entered and Authorized by:   Newt Lukes MD   Signed by:   Newt Lukes MD on 06/27/2010   Method used:   Printed then faxed to ...       [facility] (retail)       [address]       [place], [place]  [address]       Ph: [phone]       Fax: [phone]   RxID:   [id number] NYSTATIN 100000 UNIT/ML SUSP (NYSTATIN) 10cc by mouth swish, gargle and spit four times a day  #1 bottle x 0   Entered and Authorized by:   Newt Lukes MD   Signed by:   Newt Lukes MD on 06/27/2010   Method used:   Electronically to        [facility] (retail)       [address]       [place], [place]  [address]       Ph: [phone]       Fax: [phone]   RxID:   [id number]    Orders Added: 1)  Est. Patient Level IV [16606] 2)  Prescription Created Electronically [phone]

## 2010-08-23 NOTE — Letter (Signed)
[facility]   Imported By: Sherian Rein 01/04/2010 11:16:21  _____________________________________________________________________  External Attachment:    Type:   Image     Comment:   External Document

## 2010-08-23 NOTE — Assessment & Plan Note (Signed)
Summary: FU ON LEGS AND BREATHING/NWS   Vital Signs:  Patient profile:   77 year old male Height:      67 inches (170.18 cm) Weight:      244 pounds (110.91 kg) O2 Sat:      94 % on Room air Temp:     97.9 degrees F (36.61 degrees C) oral Pulse rate:   78 / minute BP sitting:   124 / 62  (left arm) Cuff size:   regular  Vitals Entered By: Orlan Leavens RMA (May 16, 2010 4:14 PM)  O2 Flow:  Room air CC: follow-up visit Is Patient Diabetic? Yes Did you bring your meter with you today? No Pain Assessment Patient in pain? no        Primary Care Provider:  [name], MD  CC:  follow-up visit.  History of Present Illness: here for f/u dyspnea onset 03/2010 ago following dc from hosp for NSTEMI  no intervention done -  no further CP but progressive PND and DOE assoc with dry cough with any exertion, also mild inc ankle swelling no fever, no sputum no hx CHF per pt increased lasix  and added zaroxyln 72h ago - feels improved breathing but no dec ankle edema  c/o LBP and B buttock pain - not using narcotics but would like PT or brace to help support his back and hips no falls or weakness  overactive bladder - esp nocturia ineffective vesicare - wants to change to oxybutinin no fever or hematuria  grief reaction - wife passed 04/19/10 following prolonged illness and freq hosp- requested something to help with sleep and nerves symptoms improving slowly over time  new DM dx- dx hosp 92011 at admission when cbg high steroid shot in knee for gout 2 days prior to hosp - never on meds for same - no polydispsia or blurred vision- plans to change diet and inc exercise   Clinical Review Panels:  Immunizations   Last Tetanus Booster:  Historical (07/25/2003)   Last Flu Vaccine:  Historical given @ hosp (04/14/2010)   Last Pneumovax:  Historical (07/24/2005)  Lipid Management   Cholesterol:  187 (04/13/2010)   LDL (bad choesterol):  115 (04/13/2010)   HDL  (good cholesterol):  41 (04/13/2010)   Triglycerides:  154 (04/13/2010)  Diabetes Management   HgBA1C:  6.2 (04/16/2010)   Creatinine:  1.5 (05/06/2010)   Last Flu Vaccine:  Historical given @ hosp (04/14/2010)   Last Pneumovax:  Historical (07/24/2005)  CBC   WBC:  6.5 (05/06/2010)   RBC:  4.67 (05/06/2010)   Hgb:  14.6 (05/06/2010)   Hct:  43.4 (05/06/2010)   Platelets:  172.0 (05/06/2010)   MCV  93.0 (05/06/2010)   MCHC  33.6 (05/06/2010)   RDW  15.9 (05/06/2010)   PMN:  46.1 (05/06/2010)   Lymphs:  34.0 (05/06/2010)   Monos:  14.0 (05/06/2010)   Eosinophils:  5.6 (05/06/2010)   Basophil:  0.3 (05/06/2010)  Complete Metabolic Panel   Glucose:  114 (05/06/2010)   Sodium:  145 (05/06/2010)   Potassium:  5.3 (05/06/2010)   Chloride:  103 (05/06/2010)   CO2:  31 (05/06/2010)   BUN:  26 (05/06/2010)   Creatinine:  1.5 (05/06/2010)   Albumin:  3.8 (04/13/2010)   Total Protein:  7.3 (04/13/2010)   Calcium:  9.7 (05/06/2010)   Total Bili:  0.5 (04/13/2010)   Alk Phos:  68 (04/13/2010)   SGPT (ALT):  26 (04/13/2010)   SGOT (AST):  39 (04/13/2010)  Current Medications (verified): 1)  Metoprolol Tartrate 50 Mg Tabs (Metoprolol Tartrate) .... Take 1 Tablet By Mouth Two Times A Day 2)  Requip 4 Mg Tabs (Ropinirole Hydrochloride) .... Take 1 Tablet By Mouth Once A Day 3)  Remeron 15 Mg Tabs (Mirtazapine) .... At Bedtime 4)  Aspirin 81 Mg Tbec (Aspirin) .... Once Daily 5)  Plavix 75 Mg Tabs (Clopidogrel Bisulfate) .... Take 1 By Mouth Once Daily 6)  Furosemide 40 Mg Tabs (Furosemide) .Marland Kitchen.. 1 By Mouth Two Times A Day  (Or As Directed) 7)  Flurazepam Hcl 30 Mg Caps (Flurazepam Hcl) .... Take 1 By Mouth Once Daily 8)  Simvastatin 40 Mg Tabs (Simvastatin) .Marland Kitchen.. 1 By Mouth Once Daily 9)  Vitamin D3 1000 Unit Tabs (Cholecalciferol) .... Take 1 By Mouth Once Daily 10)  Lorazepam 1 Mg Tabs (Lorazepam) .... Take 1 By Mouth Three Times A Day As Needed For Nerves 11)  Gabapentin 300 Mg  Caps (Gabapentin) .Marland Kitchen.. 1 By Mouth Two Times A Day 12)  Allopurinol 300 Mg Tabs (Allopurinol) .Marland Kitchen.. 1 By Mouth Once Daily 13)  Nitrostat 0.4 Mg Subl (Nitroglycerin) .... Use As Needed 14)  Pepcid Ac 10 Mg Tabs (Famotidine) .... Take 1 By Mouth Once Daily 15)  Oxybutynin Chloride 5 Mg Tabs (Oxybutynin Chloride) .Marland Kitchen.. 1 By Mouth Two Times A Day 16)  Oxycodone Hcl 5 Mg Tabs (Oxycodone Hcl) .Marland Kitchen.. 1 By Mouth Every 12hoursprn For Moderate-Severe Pain 17)  Freestyle Lite Test  Strp (Glucose Blood) .... Use Check Blood Sugar Once Daily  Dx 250.00 18)  Freestyle Lancets  Misc (Lancets) .... Use As Directed 19)  Klor-Con M20 20 Meq Cr-Tabs (Potassium Chloride Crys Cr) .Marland Kitchen.. 1 By Mouth Two Times A Day (Or As Directed) 20)  Zaroxolyn 5 Mg Tabs (Metolazone) .Marland Kitchen.. 1 By Mouth Once Daily X 5 Days, Then Every Other Day X 5 Days, Then As Needed Fpr Swelling 21)  Polyethylene Glycol 3350  Powd (Polyethylene Glycol 3350) .Marland Kitchen.. 1 Scoop in [employer] Two Times A Day  Allergies (verified): 1)  ! Lipitor (Atorvastatin Calcium)  Past History:  Past Medical History: Pericarditis MSSA s/ p pericardial window 2007  Hypertension   Hyperlipidemia  Arthritis-gouty   Degenerative disc disease     History of Transient Ischemic attack-2005 History of Left Carotid Endarterectomy Myocardial Infarction-2004, 03/2010  Mild sleep apnea Restless Leg Syndrome Retinal detachment-1994 Cataracts Coronary artery disease     GERD Urinary incontinence, urge   MD roster: cards- [facility] - [facility]- [facility]  Review of Systems  The patient denies fever, weight loss, hoarseness, syncope, and abdominal pain.    Physical Exam  General:  alert, well-developed, well-nourished, and cooperative to examination. min dyspnea with conversation (improved) Lungs:  diminshed BS B lower 1/2 lung - no inc WOB at rest with conversational dyspnea Heart:  normal rate, regular rhythm, no murmur, and no rub. BLE with  1+ ankle edema (no change) Msk:  chronic tophi over both knees (R>L) - walks with bent forward gait -suspect lumbar spinal stenosis Psych:  reserved but Oriented X3, memory intact for recent and remote, fair eye contact, not anxious appearing, approp depressed appearing, and not agitated.      Impression & Recommendations:  Problem # 1:  BACK PAIN (ICD-724.5) ?spinal stenosis - pt decline ortho eval as does not want shots opr surg - refer to PT for conserv eval and tx  His updated medication list for this problem includes:  Aspirin 81 Mg Tbec (Aspirin) ..... Once daily    Oxycodone Hcl 5 Mg Tabs (Oxycodone hcl) .Marland Kitchen... 1 by mouth every 12hoursprn for moderate-severe pain  Orders: Physical Therapy Referral (PT)  Problem # 2:  DYSPNEA (ICD-786.05)  suspect CHF based on 03/2010 NSTEMI and progressive symptoms since dc home lung exam unchanged and O2 sats low normal - has not significantly diuresed thus far as OP CXR and labs 10/14 reviewed again today weight inc 7 lbs since OV prior to 03/2010 hosp cont current diuretics and  Kcl - as symptoms improved if worse or unimproved, to go to ER if symptoms worse - pt understands and agrees to same  Complete Medication List: 1)  Metoprolol Tartrate 50 Mg Tabs (Metoprolol tartrate) .... Take 1 tablet by mouth two times a day 2)  Requip 4 Mg Tabs (Ropinirole hydrochloride) .... Take 1 tablet by mouth once a day 3)  Remeron 15 Mg Tabs (Mirtazapine) .... At bedtime 4)  Aspirin 81 Mg Tbec (Aspirin) .... Once daily 5)  Plavix 75 Mg Tabs (Clopidogrel bisulfate) .... Take 1 by mouth once daily 6)  Furosemide 40 Mg Tabs (Furosemide) .Marland Kitchen.. 1 by mouth two times a day  (or as directed) 7)  Flurazepam Hcl 30 Mg Caps (Flurazepam hcl) .... Take 1 by mouth once daily 8)  Simvastatin 40 Mg Tabs (Simvastatin) .Marland Kitchen.. 1 by mouth once daily 9)  Vitamin D3 1000 Unit Tabs (Cholecalciferol) .... Take 1 by mouth once daily 10)  Lorazepam 1 Mg Tabs (Lorazepam) .... Take  1 by mouth three times a day as needed for nerves 11)  Gabapentin 300 Mg Caps (Gabapentin) .Marland Kitchen.. 1 by mouth two times a day 12)  Allopurinol 300 Mg Tabs (Allopurinol) .Marland Kitchen.. 1 by mouth once daily 13)  Nitrostat 0.4 Mg Subl (Nitroglycerin) .... Use as needed 14)  Pepcid Ac 10 Mg Tabs (Famotidine) .... Take 1 by mouth once daily 15)  Oxybutynin Chloride 5 Mg Tabs (Oxybutynin chloride) .Marland Kitchen.. 1 by mouth two times a day 16)  Oxycodone Hcl 5 Mg Tabs (Oxycodone hcl) .Marland Kitchen.. 1 by mouth every 12hoursprn for moderate-severe pain 17)  Freestyle Lite Test Strp (Glucose blood) .... Use check blood sugar once daily  dx 250.00 18)  Freestyle Lancets Misc (Lancets) .... Use as directed 19)  Klor-con M20 20 Meq Cr-tabs (Potassium chloride crys cr) .Marland Kitchen.. 1 by mouth two times a day (or as directed) 20)  Zaroxolyn 5 Mg Tabs (Metolazone) .Marland Kitchen.. 1 by mouth once daily x 5 days, then every other day x 5 days, then as needed fpr swelling 21)  Polyethylene Glycol 3350 Powd (Polyethylene glycol 3350) .Marland Kitchen.. 1 scoop in 17oz water two times a day  Patient Instructions: 1)  it was good to see you today. 2)  continue furosemide and potassium two times a day as discussed and wean zaroxyln every other day x 5 days to increase urine output and improve breathing -  3)  we'll make referral to physical therapy for your back. Our office will contact you regarding this appointment once made.  4)  Please schedule a follow-up appointment in 6 weeks to reeeview breathing, diabetes, gout and back pain; call sooner if problems.    Orders Added: 1)  Physical Therapy Referral [PT] 2)  Est. Patient Level IV [96295]

## 2010-08-25 NOTE — Assessment & Plan Note (Signed)
Summary: 3 MO ROV /NWS  #   Vital Signs:  Patient profile:   77 year old male Height:      67 inches (170.18 cm) Weight:      243.12 pounds (110.51 kg) O2 Sat:      98 % on Room air Temp:     98.5 degrees F (36.94 degrees C) oral Pulse rate:   52 / minute BP sitting:   132 / 70  (left arm) Cuff size:   regular  Vitals Entered By: Orlan Leavens RMA (August 08, 2010 1:38 PM)  O2 Flow:  Room air CC: 3 month follow-up Is Patient Diabetic? Yes Did you bring your meter with you today? No Pain Assessment Patient in pain? yes     Location: (R) thumb Type: aching Onset of pain  Pt cut (R) thumb x's 2 weeks ago. Pt states went to [facility] only using neoprorin on it Comments Want to discussn depression, indigestion, not able to sleep and nerve problems. Also need meds sent to [facility]   Primary Care Provider:  [name], MD  CC:  3 month follow-up.  History of Present Illness: here for depression - onaet summer 2011 following death of wife  a/w exac of chronic insomnia per family, "sits in dark by himself" feels sad and "no energy" but denies SI/HI  c/o indigestion -  worse after meals, burning in chest and throat a/w sour taste no n/v/abd pain or change in bowels  chronic dyspnea - improved with inc diuretic dose exac 03/2010 ago following dc from hosp for NSTEMI  no intervention done for MI-   rx'd Alb MDI after urg care eval but hard to coordinate use with breath  overactive bladder - esp nocturia ineffective vesicare - improved with oxybutinin no fever or hematuria  DM2 dx hosp 92011 at admission when cbg high steroid shot in knee for gout 2 days prior to hosp - never on meds for same - no polydispsia or blurred vision- plans to change diet and inc exercise   Clinical Review Panels:  Lipid Management   Cholesterol:  187 (04/13/2010)   LDL (bad choesterol):  115 (04/13/2010)   HDL (good cholesterol):  41 (04/13/2010)   Triglycerides:  154  (04/13/2010)  Diabetes Management   HgBA1C:  6.2 (04/16/2010)   Creatinine:  2.235 (05/20/2010)   Last Flu Vaccine:  Historical given @ hosp (04/14/2010)   Last Pneumovax:  Historical (07/24/2005)  CBC   WBC:  11.1 (05/20/2010)   RBC:  5.06 (05/20/2010)   Hgb:  15.0 (05/20/2010)   Hct:  47.1 (05/20/2010)   Platelets:  226 (05/20/2010)   MCV  93.1 (05/20/2010)   MCHC  33.6 (05/06/2010)   RDW  14.7 (05/20/2010)   PMN:  71 (05/20/2010)   Lymphs:  34.0 (05/06/2010)   Monos:  11 (05/20/2010)   Eosinophils:  2 (05/20/2010)   Basophil:  0.3 (05/06/2010)  Complete Metabolic Panel   Glucose:  139 (05/20/2010)   Sodium:  139 (05/20/2010)   Potassium:  4.1 (05/20/2010)   Chloride:  90 (05/20/2010)   CO2:  35 (05/20/2010)   BUN:  78 (05/20/2010)   Creatinine:  2.235 (05/20/2010)   Albumin:  4.3 (05/20/2010)   Total Protein:  7.7 (05/20/2010)   Calcium:  9.9 (05/20/2010)   Total Bili:  0.8 (05/20/2010)   Alk Phos:  86 (05/20/2010)   SGPT (ALT):  15 (05/20/2010)   SGOT (AST):  25 (05/20/2010)   Current Medications (verified): 1)  Metoprolol Tartrate 50 Mg Tabs (Metoprolol Tartrate) .... Take 1 Tablet By Mouth Two Times A Day 2)  Requip 4 Mg Tabs (Ropinirole Hydrochloride) .... Take 1 Tablet By Mouth Once A Day 3)  Remeron 15 Mg Tabs (Mirtazapine) .... At Bedtime 4)  Aspirin 81 Mg Tbec (Aspirin) .... Once Daily 5)  Plavix 75 Mg Tabs (Clopidogrel Bisulfate) .... Take 1 By Mouth Once Daily 6)  Furosemide 40 Mg Tabs (Furosemide) .Marland Kitchen.. 1 By Mouth Two Times A Day  (Or As Directed) 7)  Flurazepam Hcl 30 Mg Caps (Flurazepam Hcl) .... Take 1 By Mouth Once Daily 8)  Simvastatin 40 Mg Tabs (Simvastatin) .Marland Kitchen.. 1 By Mouth Once Daily 9)  Vitamin D3 1000 Unit Tabs (Cholecalciferol) .... Take 1 By Mouth Once Daily 10)  Lorazepam 1 Mg Tabs (Lorazepam) .... Take 1 By Mouth Three Times A Day As Needed For Nerves 11)  Gabapentin 300 Mg Caps (Gabapentin) .Marland Kitchen.. 1 By Mouth Two Times A Day 12)   Allopurinol 300 Mg Tabs (Allopurinol) .Marland Kitchen.. 1 By Mouth Once Daily 13)  Nitrostat 0.4 Mg Subl (Nitroglycerin) .... Use As Needed 14)  Pepcid Ac 10 Mg Tabs (Famotidine) .... Take 1 By Mouth Once Daily 15)  Oxybutynin Chloride 5 Mg Tabs (Oxybutynin Chloride) .Marland Kitchen.. 1 By Mouth Two Times A Day 16)  Oxycodone Hcl 5 Mg Tabs (Oxycodone Hcl) .Marland Kitchen.. 1 By Mouth Every 12hoursprn For Moderate-Severe Pain 17)  Klor-Con M20 20 Meq Cr-Tabs (Potassium Chloride Crys Cr) .Marland Kitchen.. 1 By Mouth Two Times A Day (Or As Directed) 18)  Polyethylene Glycol 3350  Powd (Polyethylene Glycol 3350) .Marland Kitchen.. 1 Scoop in [employer] Two Times A Day  Allergies (verified): 1)  ! Lipitor (Atorvastatin Calcium)  Past History:  Past Medical History: Pericarditis MSSA s/ p pericardial window 2007  Hypertension   Hyperlipidemia  Arthritis-gouty   Degenerative disc disease  History of Transient Ischemic attack-2005 History of Left Carotid Endarterectomy Myocardial Infarction-2004, Apr 11, 2010  Mild sleep apnea Restless Leg Syndrome Retinal detachment-1994 Cataracts Coronary artery disease     GERD Urinary incontinence, urge   MD roster: cards- tilley  VVS - early rheum- s. deveshwar/[facility] [place]  Review of Systems  The patient denies fever, weight loss, chest pain, syncope, peripheral edema, and headaches.         c/o thumb pain following table saw injury to thumb pad 2 weeks ago; c/o sleep problems  Physical Exam  General:  alert, well-developed, well-nourished, and cooperative to examination. son at side Eyes:  vision grossly intact; pupils equal, round and reactive to light.  conjunctiva and lids normal.    Lungs:  diminshed BS lower lung bilaterally but no inc WOB at rest and no conversational dyspnea; no w/c Heart:  normal rate, regular rhythm, no murmur, and no rub. 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      Impression & Recommendations:  Problem # 1:  DEPRESSION (ICD-311)  precipitated by death of wife Apr 11, 2010, now exac by winter and darkness start med tx for same - i declined inc in BZ or other "nerve" meds as this is not tx root of problems re: sleep problems offered  counseling - pt declines f/u 6 weeks on depression symptoms and meds His updated medication list for this problem includes:    Remeron 15 Mg Tabs (Mirtazapine) .Marland Kitchen... At bedtime    Lorazepam 1 Mg Tabs (Lorazepam) .Marland Kitchen... Take 1 by mouth three times a day as needed for nerves    Wellbutrin Xl 150 Mg Xr24h-tab (Bupropion hcl) .Marland Kitchen... 1 by mouth once daily  Orders: Prescription Created Electronically [phone])  Problem # 2:  INSOMNIA, CHRONIC (ICD-307.42) suspect exac by depression - centrally acting meds not helpful - will not further increase at this time hx osa and rls on remote sleep study - refer to sleep specialist for help now Orders: Sleep Disorder Referral (Sleep Disorder)  Problem # 3:  GERD (ICD-530.81)  add protonix (ongoing plavix tx)  His updated medication list for this problem includes:    Pepcid Ac 10 Mg Tabs (Famotidine) .Marland Kitchen... Take 1 by mouth once daily    Pantoprazole Sodium 40 Mg Tbec (Pantoprazole sodium) .Marland Kitchen... 1 by mouth once daily for indigestion  EGD: DONE (12/10/2009)  Labs Reviewed: Hgb: 15.0 (05/20/2010)   Hct: 47.1 (05/20/2010)  Orders: Prescription Created Electronically [phone])  Problem # 4:  HYPERLIPIDEMIA (ICD-272.4)  simva changed to prava by cards Donnie Aho) fall 2011 due to myalgias - doing well, cont same His updated medication list for this problem includes:    Pravastatin Sodium 20 Mg Tabs (Pravastatin sodium) .Marland Kitchen... 1 by mouth at  bedtime  Labs Reviewed: SGOT: 25 (05/20/2010)   SGPT: 15 (05/20/2010)   HDL:41 (04/13/2010), 42.20 (11/01/2009)  LDL:115 (04/13/2010)  Chol:187 (04/13/2010), 224 (11/01/2009)  Trig:154 (04/13/2010), 249.0 (11/01/2009)  Complete Medication List: 1)  Metoprolol Tartrate 50 Mg Tabs (Metoprolol tartrate) .... Take 1 tablet by mouth two times a day 2)  Requip 4 Mg Tabs (Ropinirole hydrochloride) .... Take 1 tablet by mouth once a day 3)  Remeron 15 Mg Tabs (Mirtazapine) .... At bedtime 4)  Aspirin 81 Mg Tbec (Aspirin) .... Once daily 5)  Plavix 75 Mg Tabs (Clopidogrel bisulfate) .... Take 1 by mouth once daily 6)  Furosemide 40 Mg Tabs (Furosemide) .Marland Kitchen.. 1 by mouth two times a day  (or as directed) 7)  Flurazepam Hcl 30 Mg Caps (Flurazepam hcl) .... Take 1 by mouth once daily 8)  Pravastatin Sodium 20 Mg Tabs (Pravastatin sodium) .Marland Kitchen.. 1 by mouth at bedtime 9)  Vitamin D3 1000 Unit Tabs (Cholecalciferol) .... Take 1 by mouth once daily 10)  Lorazepam 1 Mg Tabs (Lorazepam) .... Take 1 by mouth three times a day as needed for nerves 11)  Gabapentin 300 Mg Caps (Gabapentin) .Marland Kitchen.. 1 by mouth two times a day 12)  Allopurinol 300 Mg Tabs (Allopurinol) .Marland Kitchen.. 1 by mouth once daily 13)  Nitrostat 0.4 Mg Subl (Nitroglycerin) .... Use as needed 14)  Pepcid Ac 10 Mg Tabs (Famotidine) .... Take 1 by mouth once daily 15)  Oxybutynin Chloride 5 Mg Tabs (Oxybutynin chloride) .Marland Kitchen.. 1 by mouth two times a day 16)  Oxycodone Hcl 5 Mg Tabs (Oxycodone hcl) .Marland Kitchen.. 1 by mouth every 12hoursprn for moderate-severe pain 17)  Klor-con M20 20 Meq Cr-tabs (Potassium chloride crys cr) .Marland Kitchen.. 1 by mouth two times a day (or as directed) 18)  Polyethylene Glycol 3350 Powd (Polyethylene glycol 3350) .Marland Kitchen.. 1 scoop in 17oz water two times a day 19)  Colcrys 0.6 Mg Tabs (Colchicine) .... Take 1 two times a day 20)  Wellbutrin Xl 150 Mg Xr24h-tab (Bupropion hcl) .Marland Kitchen.. 1 by mouth once daily 21)  Pantoprazole Sodium 40 Mg Tbec (Pantoprazole  sodium) .Marland Kitchen.. 1 by mouth once daily for indigestion 22)  Proventil Hfa 108 (90 Base) Mcg/act Aers (Albuterol sulfate) .... 2 puffs every 4 hours as needed for shortness of breath 23)  E-z Spacer Devi (Spacer/aero-holding chambers) .... Spacer to use with albuterol inhaler as needed  Patient Instructions: 1)  it was good to see you today. 2)  refills on all "old" medications sent to [facility] 3)  start wellbutrin for depression symptoms and Protonix for indigestion - your prescriptions have been electronically submitted to your local pharmacy (until we know if you are going to stay on the same medications). Please take as directed. Contact our office if you believe you're having problems with the medication(s).  4)  also prescription for a spacer to use with your inhaler - Please use as directed.  5)  we'll make referral to pulmonary for sleep evaluation. Our office will contact you regarding this appointment once made.  6)  Please schedule a follow-up appointment in 6 weeks to review depression and indigestion, call sooner if problems.  7)  continue to use neosporin on thumb and keep covered - this will take 6-8weeks to heal Prescriptions: E-Z SPACER  DEVI (SPACER/AERO-HOLDING CHAMBERS) spacer to use with albuterol inhaler as needed  #1 x 0   Entered and Authorized by:   Newt Lukes MD   Signed by:   Newt Lukes MD on 08/08/2010   Method used:   Electronically to        [facility]* (retail)       [address]       [place], [place]  [address]       Ph: [phone]       Fax: [phone]   RxID:   [id number] PANTOPRAZOLE SODIUM 40 MG TBEC (PANTOPRAZOLE SODIUM) 1 by mouth once daily for indigestion  #30 x 3   Entered and Authorized by:   Newt Lukes MD   Signed by:   Newt Lukes MD on 08/08/2010   Method used:   Electronically to        [facility]* (retail)       [address]       [place], [place]   [address]       Ph: [phone]       Fax: [phone]   RxID:   [id number] WELLBUTRIN XL 150 MG XR24H-TAB (BUPROPION HCL) 1 by mouth once daily  #30 x 3   Entered and Authorized by:   Newt Lukes MD   Signed by:   Newt Lukes MD on 08/08/2010   Method used:   Electronically to        [facility] (retail)       [address]       [place], [place]  [address]       Ph: [phone]       Fax: [phone]   RxID:   [id number] PRAVASTATIN SODIUM 20 MG TABS (PRAVASTATIN SODIUM) 1 by mouth at bedtime  #90 x 3   Entered and Authorized by:   Newt Lukes MD   Signed by:   Newt Lukes MD on 08/08/2010   Method used:   Faxed to ...       [facility] (mail-order)             ,  [place]         Ph: [phone]       Fax: [phone]   RxID:   [id number] WELLBUTRIN XL 150 MG XR24H-TAB (BUPROPION HCL) 1 by mouth once daily  #30 x 3   Entered and Authorized by:   Newt Lukes MD   Signed by:   Newt Lukes MD on 08/08/2010   Method used:   Historical   RxID:   [id number] COLCRYS 0.6 MG TABS (COLCHICINE) take 1 two times a day  #180 x 3   Entered by:   Orlan Leavens RMA   Authorized by:   Newt Lukes MD   Signed by:   Newt Lukes MD on 08/08/2010   Method used:   Faxed to ...       [facility] (mail-order)             , [place]         Ph: [phone]       Fax: [phone]   RxID:   [id number] KLOR-CON M20 20 MEQ CR-TABS (POTASSIUM CHLORIDE CRYS CR) 1 by mouth two times a day (or as directed)  #180 x 1   Entered by:   Orlan Leavens RMA   Authorized by:   Newt Lukes MD   Signed by:   Orlan Leavens RMA on 08/08/2010   Method used:   Faxed to ...       [facility] (mail-order)             , [place]         Ph: [phone]       Fax: [phone]   RxID:   [id number] OXYBUTYNIN CHLORIDE 5 MG TABS (OXYBUTYNIN CHLORIDE) 1 by mouth two times a day  #180 x 3   Entered by:   Orlan Leavens RMA   Authorized by:    Newt Lukes MD   Signed by:   Orlan Leavens RMA on 08/08/2010   Method used:   Faxed to ...       [facility] (mail-order)             , [place]         Ph: [phone]       Fax: [phone]   RxID:   [id number] ALLOPURINOL 300 MG TABS (ALLOPURINOL) 1 by mouth once daily  #90 x 3   Entered by:   Orlan Leavens RMA   Authorized by:   Newt Lukes MD   Signed by:   Orlan Leavens RMA on 08/08/2010   Method used:   Faxed to ...       [facility] (mail-order)             , [place]         Ph: [phone]       Fax: [phone]   RxID:   [id number] GABAPENTIN 300 MG CAPS (GABAPENTIN) 1 by mouth two times a day  #180 x 3   Entered by:   Orlan Leavens RMA   Authorized by:   Newt Lukes MD   Signed by:   Orlan Leavens RMA on 08/08/2010   Method used:   Faxed to ...       [facility] (mail-order)             , [place]         Ph: [phone]       Fax: [phone]   RxID:   [id number] PLAVIX 75 MG TABS (  CLOPIDOGREL BISULFATE) take 1 by mouth once daily  #90 x 3   Entered by:   Orlan Leavens RMA   Authorized by:   Newt Lukes MD   Signed by:   Orlan Leavens RMA on 08/08/2010   Method used:   Faxed to ...       [facility] (mail-order)             , [place]         Ph: [phone]       Fax: [phone]   RxID:   [id number] REMERON 15 MG TABS (MIRTAZAPINE) at bedtime  #90 x 3   Entered by:   Orlan Leavens RMA   Authorized by:   Newt Lukes MD   Signed by:   Orlan Leavens RMA on 08/08/2010   Method used:   Faxed to ...       [facility] (mail-order)             , [place]         Ph: [phone]       Fax: [phone]   RxID:   [id number] REQUIP 4 MG TABS (ROPINIROLE HYDROCHLORIDE) Take 1 tablet by mouth once a day  #90 x 3   Entered by:   Orlan Leavens RMA   Authorized by:   Newt Lukes MD   Signed by:   Orlan Leavens RMA on 08/08/2010   Method used:   Faxed to ...       [facility] (mail-order)             , [place]         Ph: [phone]       Fax: [phone]   RxID:    [id number] METOPROLOL TARTRATE 50 MG TABS (METOPROLOL TARTRATE) Take 1 tablet by mouth two times a day  #180 x 3   Entered by:   Orlan Leavens RMA   Authorized by:   Newt Lukes MD   Signed by:   Orlan Leavens RMA on 08/08/2010   Method used:   Faxed to ...       [facility] (mail-order)             , [place]         Ph: [phone]       Fax: [phone]   RxID:   [id number]    Orders Added: 1)  Sleep Disorder Referral [Sleep Disorder] 2)  Est. Patient Level IV [[address]] 3)  Prescription Created Electronically [phone]

## 2010-08-25 NOTE — Progress Notes (Signed)
Summary: REFERRAL REQUEST  Phone Note Call from Patient   Caller: [phone] Ext [phone] Daughter - Ardell Isaacs Summary of Call: Pt's daughter left vm. She is req referral to Dr Dareen Piano at [facility] for arthritis.  Initial call taken by: Lamar Sprinkles, CMA,  July 07, 2010 6:02 PM  Follow-up for Phone Call        ok - referral done as requested Follow-up by: Newt Lukes MD,  July 08, 2010 9:47 AM  Additional Follow-up for Phone Call Additional follow up Details #1::        Pt's daughter informed and will expect a call from [facility] with appt info Additional Follow-up by: Margaret Pyle, CMA,  July 08, 2010 9:52 AM

## 2010-08-25 NOTE — Progress Notes (Signed)
Summary: klor con  Phone Note Refill Request Message from:  Fax from Pharmacy on July 21, 2010 8:42 AM  Refills Requested: Medication #1:  KLOR-CON M20 20 MEQ CR-TABS 1 by mouth two times a day (or as directed)   Last Refilled: 06/13/2010  Method Requested: Electronic Initial call taken by: Orlan Leavens RMA,  July 21, 2010 8:43 AM    Prescriptions: KLOR-CON M20 20 MEQ CR-TABS (POTASSIUM CHLORIDE CRYS CR) 1 by mouth two times a day (or as directed)  #60 x 2   Entered by:   Orlan Leavens RMA   Authorized by:   Newt Lukes MD   Signed by:   Orlan Leavens RMA on 07/21/2010   Method used:   Electronically to        [facility]* (retail)       [address]       [place], [place]  [address]       Ph: [phone]       Fax: [phone]   RxID:   [id number]

## 2010-08-25 NOTE — Consult Note (Signed)
Summary: [facility]   Imported By: Lennie Odor 08/08/2010 14:41:23  _____________________________________________________________________  External Attachment:    Type:   Image     Comment:   External Document

## 2010-08-25 NOTE — Progress Notes (Signed)
Summary: dtr's concerns about pt  Phone Note Other Incoming   Caller: pt daughter Mosie Lukes  [phone] x [phone] Summary of Call: Dr Felicity Coyer, pt dtr wanted to to inform you that her father has had a problem swallowing and with heartburn.Also thinks father may need a mild anti-depress because he has been sitiing in the dark alot since wife death. Patient has appt today 08-11-2010. Initial call taken by: Dagoberto Reef,  August 08, 2010 12:01 PM  Follow-up for Phone Call        ok noted, will address at OV - thanks Follow-up by: Newt Lukes MD,  August 08, 2010 1:04 PM

## 2010-08-25 NOTE — Miscellaneous (Signed)
Summary: Discharged from [facility]  Discharged from PT[facility]   Imported By: Sherian Rein 08/15/2010 09:44:10  _____________________________________________________________________  External Attachment:    Type:   Image     Comment:   External Document

## 2010-09-05 ENCOUNTER — Encounter: Payer: Self-pay | Admitting: Internal Medicine

## 2010-09-05 ENCOUNTER — Ambulatory Visit (INDEPENDENT_AMBULATORY_CARE_PROVIDER_SITE_OTHER): Payer: Medicare Other | Admitting: Internal Medicine

## 2010-09-05 DIAGNOSIS — G4700 Insomnia, unspecified: Secondary | ICD-10-CM

## 2010-09-05 DIAGNOSIS — F329 Major depressive disorder, single episode, unspecified: Secondary | ICD-10-CM

## 2010-09-05 DIAGNOSIS — G4733 Obstructive sleep apnea (adult) (pediatric): Secondary | ICD-10-CM

## 2010-09-05 DIAGNOSIS — R1310 Dysphagia, unspecified: Secondary | ICD-10-CM

## 2010-09-06 ENCOUNTER — Ambulatory Visit (HOSPITAL_BASED_OUTPATIENT_CLINIC_OR_DEPARTMENT_OTHER): Payer: Medicare Other | Attending: Pulmonary Disease

## 2010-09-06 ENCOUNTER — Encounter: Payer: Self-pay | Admitting: Pulmonary Disease

## 2010-09-06 DIAGNOSIS — I4949 Other premature depolarization: Secondary | ICD-10-CM | POA: Insufficient documentation

## 2010-09-06 DIAGNOSIS — G4733 Obstructive sleep apnea (adult) (pediatric): Secondary | ICD-10-CM | POA: Insufficient documentation

## 2010-09-08 NOTE — Assessment & Plan Note (Addendum)
Summary: consult for sleep issues   Copy to:  Mason Najjar, MD Primary Provider/Referring Provider:  [name], MD  CC:  Sleep Consult.  History of Present Illness: The pt is a 77y/o male who I have been asked to see for sleep issues.  The pt states he has had trouble sleeping for years.  He has a h/o RLS for which he is on high dose dopamine agonist, but continues to have breakthru symptoms.  He has been noted to have loud snoring, and his daughter has witnessed pauses in his breathing during sleep.  The pt has noted choking arousals on occasion.  He goes to bed btw 10-63mn, and usually takes to fall asleep.  If his legs are bothering him, it is impossible to sleep.  He awakens every hour or two, but the majority of the time he gets back to sleep quickly.  He awakens at 6am to start his day, and is not rested most am's.  He will fall asleep if he sits down for any period of time.  He also notes sleepiness while driving at times.  The pt's epworth today is abnormal at 15, and his weight is up 20 pounds over the last few years.  He describes his leg issue as an uncontrollable feeling of movement in the evening and at bedtime.  It is improved by actively moving his legs or getting up and walking around.  The pt is unaware if he has ever had his iron levels checked.    Current Medications (verified): 1)  Metoprolol Tartrate 50 Mg Tabs (Metoprolol Tartrate) .... Take 1 Tablet By Mouth Two Times A Day 2)  Requip 4 Mg Tabs (Ropinirole Hydrochloride) .... Take 1 Tablet By Mouth Once A Day 3)  Remeron 15 Mg Tabs (Mirtazapine) .... At Bedtime 4)  Aspirin 81 Mg Tbec (Aspirin) .... Once Daily 5)  Plavix 75 Mg Tabs (Clopidogrel Bisulfate) .... Take 1 By Mouth Once Daily 6)  Furosemide 40 Mg Tabs (Furosemide) .Marland Kitchen.. 1 By Mouth Two Times A Day  (Or As Directed) 7)  Pravastatin Sodium 20 Mg Tabs (Pravastatin Sodium) .Marland Kitchen.. 1 By Mouth At Bedtime 8)  Vitamin D3 1000 Unit Tabs (Cholecalciferol) ....  Take 1 By Mouth Once Daily 9)  Lorazepam 1 Mg Tabs (Lorazepam) .... Take 1 By Mouth Three Times A Day As Needed For Nerves 10)  Gabapentin 300 Mg Caps (Gabapentin) .Marland Kitchen.. 1 By Mouth Two Times A Day 11)  Allopurinol 300 Mg Tabs (Allopurinol) .Marland Kitchen.. 1 By Mouth Once Daily 12)  Nitrostat 0.4 Mg Subl (Nitroglycerin) .... Use As Needed 13)  Oxybutynin Chloride 5 Mg Tabs (Oxybutynin Chloride) .Marland Kitchen.. 1 By Mouth Two Times A Day 14)  Oxycodone Hcl 5 Mg Tabs (Oxycodone Hcl) .Marland Kitchen.. 1 By Mouth Every 12hoursprn For Moderate-Severe Pain 15)  Klor-Con M20 20 Meq Cr-Tabs (Potassium Chloride Crys Cr) .Marland Kitchen.. 1 By Mouth Two Times A Day (Or As Directed) 16)  Colcrys 0.6 Mg Tabs (Colchicine) .... Take 1 Tablet By Mouth Once A Day 17)  Wellbutrin Xl 150 Mg Xr24h-Tab (Bupropion Hcl) .Marland Kitchen.. 1 By Mouth Once Daily 18)  Pantoprazole Sodium 40 Mg Tbec (Pantoprazole Sodium) .Marland Kitchen.. 1 By Mouth Once Daily For Indigestion 19)  Proventil Hfa 108 (90 Base) Mcg/act Aers (Albuterol Sulfate) .... 2 Puffs Every 4 Hours As Needed For Shortness of Breath 20)  E-Z Spacer  Devi (Spacer/aero-Holding [place]) .... Spacer To Use With Albuterol Inhaler As Needed  Allergies (verified): 1)  ! Lipitor (Atorvastatin Calcium)  Past  History:  Past Medical History: Reviewed history from 08/08/2010 and no changes required. Pericarditis MSSA s/ p pericardial window 2007  Hypertension   Hyperlipidemia  Arthritis-gouty   Degenerative disc disease  History of Transient Ischemic attack-2005 History of Left Carotid Endarterectomy Myocardial Infarction-2004, 03/2010  Mild sleep apnea Restless Leg Syndrome Retinal detachment-1994 Cataracts Coronary artery disease     GERD Urinary incontinence, urge   MD roster: cards- [name]  [facility] - [facility]- [facility]. [facility]  Past Surgical History: Reviewed history from 11/30/2009 and no changes required. PTCA/stent (01/2009) pericardial window (2007) Nerve in arm-left (2008) right arm  surgery  Family History: Reviewed history from 11/30/2009 and no changes required. Family History Diabetes 1st degree relative (parent)-father Family History of Prostate CA 1st degree relative <50 (other relative): Brother Heart disease (other relative): Mother, Father No FH of Colon Cancer: Family History of Liver Cancer:brother? brother with lung cancer sister with breast cancer  Social History: Reviewed history from 09/16/2009 and no changes required. Never Smoked pt is widowed and lives alone.  pt has children. Retired from [employer] Alcohol use-no Drug use-no Regular exercise-no  Review of Systems       The patient complains of shortness of breath with activity, shortness of breath at rest, acid heartburn, indigestion, weight change, difficulty swallowing, tooth/dental problems, depression, hand/feet swelling, and joint stiffness or pain.  The patient denies productive cough, non-productive cough, coughing up blood, chest pain, irregular heartbeats, loss of appetite, abdominal pain, sore throat, headaches, nasal congestion/difficulty breathing through nose, sneezing, itching, ear ache, anxiety, rash, change in color of mucus, and fever.    Vital Signs:  Patient profile:   77 year old male Height:      69 inches Weight:      248.50 pounds BMI:     36.83 O2 Sat:      94 % on Room air Temp:     98.1 degrees F oral Pulse rate:   74 / minute BP sitting:   132 / 60  (left arm) Cuff size:   large  Vitals Entered By: Arman Filter LPN (August 23, 2010 3:16 PM)  O2 Flow:  Room air CC: Sleep Consult Comments Medications reviewed with patient Arman Filter LPN  August 23, 2010 3:17 PM    Physical Exam  Eyes:  left pupil >>right, eomi  Nose:  right totally obstructed, left nearly totally obstructed Mouth:  very small space posteriorly swollen and elongated uvula, elongated palate. Neck:  large, difficult to examine for jvd no tmg or LN Lungs:   bibasilar crackles, no wheezing or rhonchi Heart:  rrr, 3/6 sem Abdomen:  soft and nontender, bs+ Extremities:  1+ edema bilat, no cyanosis pulses decreased, but palpable distally Neurologic:  alert and oriented, moves all 4. appears sleepy.   Impression & Recommendations:  Problem # 1:  OBSTRUCTIVE SLEEP APNEA (ICD-327.23) the pt has been noted to have loud snoring, choking arousals, nonrestorative sleep, and has very abnormal upper airway anatomy.  I think it is very likely he has osa.  My suspicion is that he does not have insomnia, but rather disrupted sleep from his RLS and probable sleep state misperception associated with sleep apnea.  I have reminded the pt of his moral obligation to not drive if he is sleepy.  Problem # 2:  RESTLESS LEG SYNDROME (ICD-333.94) the pt has a hx that is very consistent with ongoing RLS, but is on high dose requip and has tried mirapex in the  past.  I wonder if this is augmentation associated with high dose dopamine agonist, simply that he has severe disease and has failed dopamine agonist therapy, and finally is this really RLS??  I am always suspicious if pts do not respond to standard therapy, and would consider neuropathy and neuropathic pain from LS disc disease as well.  The other possibility is iron deficiency, which is a known cause of RLS.  Will try him on horizant and decrease his dose of requip.  Will see if he has had an iron panel recently.  Medications Added to Medication List This Visit: 1)  Colcrys 0.6 Mg Tabs (Colchicine) .... Take 1 tablet by mouth once a day  Other Orders: Consultation Level V (09811) Sleep Disorder Referral (Sleep Disorder)  Patient Instructions: 1)  cut requip in half and take only 2mg  in afternoon before dinner. 2)  start horizant 600mg  one each evening after dinner as a trial.  If you do well after a week, try to stop requip. 3)  will schedule for a sleep study, and will arrange followup with me after results are  available.   4)  If the medication does not help your leg symptoms, let me know.  Appended Document: consult for sleep issues megan, please let pt know that I have reviewed his sleep study from 2006, and he had minimal sleep apnea at that time.  will go ahead and repeat his sleep study since his symptoms seem much worse at this time.  order sent to [facility]  Appended Document: consult for sleep issues called and spoke with pt.  pt aware of the above information.  pt has already spoke with Bjorn Loser and is scheduled for his sleep study.

## 2010-09-12 ENCOUNTER — Telehealth (INDEPENDENT_AMBULATORY_CARE_PROVIDER_SITE_OTHER): Payer: Self-pay | Admitting: *Deleted

## 2010-09-14 NOTE — Assessment & Plan Note (Signed)
Summary: SWOLLEN THROAT HIP PAIN X 1 WK OR SO  STC   Vital Signs:  Patient profile:   77 year old male Height:      69 inches (175.26 cm) Weight:      246.4 pounds (112 kg) O2 Sat:      94 % on Room air Temp:     97.9 degrees F (36.61 degrees C) oral Pulse rate:   57 / minute BP sitting:   140 / 70  (left arm) Cuff size:   large  Vitals Entered By: Orlan Leavens RMA (September 05, 2010 3:00 PM)  O2 Flow:  Room air CC: Swollen throat? Is Patient Diabetic? Yes Did you bring your meter with you today? No Pain Assessment Patient in pain? yes     Location: hip Type: aching Comments Pt states when he swallows his food get stuck also his pills   Primary Care Provider:  [name], MD  CC:  Swollen throat?Marland Kitchen  History of Present Illness: here for GERD?  feels choked with every swallow, ?food stuck but no vomitting or regurgitation affects liquids and solids equally symptoms occur even when not eating not improved with PPI - compliant with rx'd meds  also c/o cont poor sleep -  ?if repeat sleep study planned for OSA -  saw pulm for same 07/2010 but still with ??s  also reviewed chronic med issues: depression - onset summer 2011 following death of wife  a/w exac of chronic insomnia started wellbutrin for same 07/2010 - improved spirits per family  chronic dyspnea - improved with inc diuretic dose exac 03/2010 ago following dc from hosp for NSTEMI  no intervention done for MI-   rx'd Alb MDI after urg care eval but hard to coordinate use with breath  overactive bladder - esp nocturia ineffective vesicare - improved with oxybutinin no fever or hematuria  DM2 -dx hosp 92011 at admission when cbg high s/p steroid shot in knee 2 days prior to hosp - never on meds for same - no polydipsia or blurred vision- plans to change diet and inc exercise   Clinical Review Panels:  Diabetes Management   HgBA1C:  6.2 (04/16/2010)   Creatinine:  2.235 (05/20/2010)   Last Flu  Vaccine:  Historical given @ hosp (04/14/2010)   Last Pneumovax:  Historical (07/24/2005)  CBC   WBC:  11.1 (05/20/2010)   RBC:  5.06 (05/20/2010)   Hgb:  15.0 (05/20/2010)   Hct:  47.1 (05/20/2010)   Platelets:  226 (05/20/2010)   MCV  93.1 (05/20/2010)   MCHC  33.6 (05/06/2010)   RDW  14.7 (05/20/2010)   PMN:  71 (05/20/2010)   Lymphs:  34.0 (05/06/2010)   Monos:  11 (05/20/2010)   Eosinophils:  2 (05/20/2010)   Basophil:  0.3 (05/06/2010)  Complete Metabolic Panel   Glucose:  139 (05/20/2010)   Sodium:  139 (05/20/2010)   Potassium:  4.1 (05/20/2010)   Chloride:  90 (05/20/2010)   CO2:  35 (05/20/2010)   BUN:  78 (05/20/2010)   Creatinine:  2.235 (05/20/2010)   Albumin:  4.3 (05/20/2010)   Total Protein:  7.7 (05/20/2010)   Calcium:  9.9 (05/20/2010)   Total Bili:  0.8 (05/20/2010)   Alk Phos:  86 (05/20/2010)   SGPT (ALT):  15 (05/20/2010)   SGOT (AST):  25 (05/20/2010)   Current Medications (verified): 1)  Metoprolol Tartrate 50 Mg Tabs (Metoprolol Tartrate) .... Take 1 Tablet By Mouth Two Times A Day 2)  Requip 4 Mg  Tabs (Ropinirole Hydrochloride) .... Take 1 Tablet By Mouth Once A Day 3)  Remeron 15 Mg Tabs (Mirtazapine) .... At Bedtime 4)  Aspirin 81 Mg Tbec (Aspirin) .... Once Daily 5)  Plavix 75 Mg Tabs (Clopidogrel Bisulfate) .... Take 1 By Mouth Once Daily 6)  Furosemide 40 Mg Tabs (Furosemide) .Marland Kitchen.. 1 By Mouth Two Times A Day  (Or As Directed) 7)  Pravastatin Sodium 20 Mg Tabs (Pravastatin Sodium) .Marland Kitchen.. 1 By Mouth At Bedtime 8)  Vitamin D3 1000 Unit Tabs (Cholecalciferol) .... Take 1 By Mouth Once Daily 9)  Lorazepam 1 Mg Tabs (Lorazepam) .... Take 1 By Mouth Three Times A Day As Needed For Nerves 10)  Gabapentin 300 Mg Caps (Gabapentin) .Marland Kitchen.. 1 By Mouth Two Times A Day 11)  Allopurinol 300 Mg Tabs (Allopurinol) .Marland Kitchen.. 1 By Mouth Once Daily 12)  Nitrostat 0.4 Mg Subl (Nitroglycerin) .... Use As Needed 13)  Oxybutynin Chloride 5 Mg Tabs (Oxybutynin Chloride)  .Marland Kitchen.. 1 By Mouth Two Times A Day 14)  Oxycodone Hcl 5 Mg Tabs (Oxycodone Hcl) .Marland Kitchen.. 1 By Mouth Every 12hoursprn For Moderate-Severe Pain 15)  Klor-Con M20 20 Meq Cr-Tabs (Potassium Chloride Crys Cr) .Marland Kitchen.. 1 By Mouth Two Times A Day (Or As Directed) 16)  Colcrys 0.6 Mg Tabs (Colchicine) .... Take 1 Tablet By Mouth Once A Day 17)  Wellbutrin Xl 150 Mg Xr24h-Tab (Bupropion Hcl) .Marland Kitchen.. 1 By Mouth Once Daily 18)  Pantoprazole Sodium 40 Mg Tbec (Pantoprazole Sodium) .Marland Kitchen.. 1 By Mouth Once Daily For Indigestion 19)  Proventil Hfa 108 (90 Base) Mcg/act Aers (Albuterol Sulfate) .... 2 Puffs Every 4 Hours As Needed For Shortness of Breath 20)  E-Z Spacer  Devi (Spacer/aero-Holding [place]) .... Spacer To Use With Albuterol Inhaler As Needed  Allergies (verified): 1)  ! Lipitor (Atorvastatin Calcium)  Past History:  Past Medical History: Pericarditis MSSA s/ p pericardial window 2007  Hypertension   Hyperlipidemia  Arthritis-gouty    Degenerative disc disease  History of Transient Ischemic attack-2005 History of Left Carotid Endarterectomy Myocardial Infarction-2004, 03/2010  Mild sleep apnea Restless Leg Syndrome Retinal detachment-1994 Cataracts Coronary artery disease     GERD Urinary incontinence, urge   MD roster: cards- Donnie Aho  [facility] - [facility]- [facility]. [facility] - [facility] - [facility] - [facility]  Review of Systems  The patient denies fever, weight loss, headaches, melena, and hematochezia.    Physical Exam  General:  alert, well-developed, well-nourished, and cooperative to examination. Eyes:  vision grossly intact; pupils equal, round and reactive to light.  conjunctiva and lids normal.    Ears:  normal pinnae bilaterally, without erythema, swelling, or tenderness to palpation. TMs clear, without effusion, or cerumen impaction. Hearing grossly normal bilaterally  Mouth:  no mucocutaneous lesions or swelling or edema - thrush changes on tongue Lungs:  diminshed  BS lower lung bilaterally but no inc WOB at rest and no conversational dyspnea; no w/c Heart:  normal rate, regular rhythm, no murmur, and no rub. BLE with 1+ ankle edema (no change) Abdomen:  soft, non-tender, normal bowel sounds, no distention; no masses and no appreciable hepatomegaly or splenomegaly.     Impression & Recommendations:  Problem # 1:  DYSPHAGIA UNSPECIFIED (ICD-787.20)  ?esoph stricture or other abn to explain regurgitation symptoms  cont PPI and refer to GI for eval and tx of same  Orders: Gastroenterology Referral (GI)  Problem # 2:  OBSTRUCTIVE SLEEP APNEA (ICD-327.23) s/p eval by pulm for same 08/23/10 - ?repeat sleep study  or "next step"? will f/u with pulm for same i decliens to inc meds for "insomnia" again today - defer to sleep specialist as needed   Problem # 3:  INSOMNIA, CHRONIC (ICD-307.42)  suspect exac by depression - cont wellbutrin centrally acting meds not helpful - will not further increase at this time hx osa and rls on remote sleep study -f/u sleep specialist  as ongoing  Problem # 4:  DEPRESSION (ICD-311)  His updated medication list for this problem includes:    Remeron 15 Mg Tabs (Mirtazapine) .Marland Kitchen... At bedtime    Lorazepam 1 Mg Tabs (Lorazepam) .Marland Kitchen... Take 1 by mouth three times a day as needed for nerves    Wellbutrin Xl 150 Mg Xr24h-tab (Bupropion hcl) .Marland Kitchen... 1 by mouth once daily  precipitated by death of wife 04-08-10, now exac by winter and darkness started med tx for same 07/2010 - improved - cont same  Complete Medication List: 1)  Metoprolol Tartrate 50 Mg Tabs (Metoprolol tartrate) .... Take 1 tablet by mouth two times a day 2)  Requip 4 Mg Tabs (Ropinirole hydrochloride) .... Take 1 tablet by mouth once a day 3)  Remeron 15 Mg Tabs (Mirtazapine) .... At bedtime 4)  Aspirin 81 Mg Tbec (Aspirin) .... Once daily 5)  Plavix 75 Mg Tabs (Clopidogrel bisulfate) .... Take 1 by mouth once daily 6)  Furosemide 40 Mg Tabs (Furosemide) .Marland Kitchen.. 1  by mouth two times a day  (or as directed) 7)  Pravastatin Sodium 20 Mg Tabs (Pravastatin sodium) .Marland Kitchen.. 1 by mouth at bedtime 8)  Vitamin D3 1000 Unit Tabs (Cholecalciferol) .... Take 1 by mouth once daily 9)  Lorazepam 1 Mg Tabs (Lorazepam) .... Take 1 by mouth three times a day as needed for nerves 10)  Gabapentin 300 Mg Caps (Gabapentin) .Marland Kitchen.. 1 by mouth two times a day 11)  Allopurinol 300 Mg Tabs (Allopurinol) .Marland Kitchen.. 1 by mouth once daily 12)  Nitrostat 0.4 Mg Subl (Nitroglycerin) .... Use as needed 13)  Oxybutynin Chloride 5 Mg Tabs (Oxybutynin chloride) .Marland Kitchen.. 1 by mouth two times a day 14)  Oxycodone Hcl 5 Mg Tabs (Oxycodone hcl) .Marland Kitchen.. 1 by mouth every 12hoursprn for moderate-severe pain 15)  Klor-con M20 20 Meq Cr-tabs (Potassium chloride crys cr) .Marland Kitchen.. 1 by mouth two times a day (or as directed) 16)  Colcrys 0.6 Mg Tabs (Colchicine) .... Take 1 tablet by mouth once a day 17)  Wellbutrin Xl 150 Mg Xr24h-tab (Bupropion hcl) .Marland Kitchen.. 1 by mouth once daily 18)  Pantoprazole Sodium 40 Mg Tbec (Pantoprazole sodium) .Marland Kitchen.. 1 by mouth once daily for indigestion 19)  Proventil Hfa 108 (90 Base) Mcg/act Aers (Albuterol sulfate) .... 2 puffs every 4 hours as needed for shortness of breath 20)  E-z Spacer Devi (Spacer/aero-holding chambers) .... Spacer to use with albuterol inhaler as needed  Patient Instructions: 1)  it was good to see you today. 2)  no medication changes today - continue as ongoing 3)  will ask dr. Shelle Iron what is planned for next step on your sleep evaluation - 4)  we'll make referral to GI- dr. Jarold Motto for your swallowing problems. Our office will contact you regarding this appointment once made.  5)  Please schedule a follow-up appointment in 3 months to review diabetes and other issues, call sooner if problems.    Orders Added: 1)  Est. Patient Level IV [13086] 2)  Gastroenterology Referral [GI]

## 2010-09-14 NOTE — Miscellaneous (Signed)
Summary: Orders Update  Clinical Lists Changes  Orders: Added new Referral order of Sleep Disorder Referral (Sleep Disorder) - Signed 

## 2010-09-20 ENCOUNTER — Ambulatory Visit (INDEPENDENT_AMBULATORY_CARE_PROVIDER_SITE_OTHER): Payer: Medicare Other | Admitting: Vascular Surgery

## 2010-09-20 ENCOUNTER — Encounter (INDEPENDENT_AMBULATORY_CARE_PROVIDER_SITE_OTHER): Payer: Self-pay | Admitting: *Deleted

## 2010-09-20 ENCOUNTER — Ambulatory Visit (INDEPENDENT_AMBULATORY_CARE_PROVIDER_SITE_OTHER): Payer: Medicare Other | Admitting: Gastroenterology

## 2010-09-20 ENCOUNTER — Encounter: Payer: Self-pay | Admitting: Internal Medicine

## 2010-09-20 ENCOUNTER — Other Ambulatory Visit (INDEPENDENT_AMBULATORY_CARE_PROVIDER_SITE_OTHER): Payer: Medicare Other

## 2010-09-20 ENCOUNTER — Encounter: Payer: Self-pay | Admitting: Gastroenterology

## 2010-09-20 DIAGNOSIS — I6529 Occlusion and stenosis of unspecified carotid artery: Secondary | ICD-10-CM

## 2010-09-20 DIAGNOSIS — R1310 Dysphagia, unspecified: Secondary | ICD-10-CM

## 2010-09-20 DIAGNOSIS — K219 Gastro-esophageal reflux disease without esophagitis: Secondary | ICD-10-CM

## 2010-09-20 DIAGNOSIS — Z48812 Encounter for surgical aftercare following surgery on the circulatory system: Secondary | ICD-10-CM

## 2010-09-20 NOTE — Progress Notes (Signed)
Summary: results of sleep study [id number]  Phone Note Call from Patient Call back at [phone]   Caller: Patient Call For: clance Reason for Call: Talk to Nurse, Lab or Test Results Summary of Call: Patient asking for sleep test results. Initial call taken by: Lehman Prom,  September 12, 2010 9:11 AM  Follow-up for Phone Call        per our records, pt JUSt had sleep study done less than 1 week ago on Feb 14.  Results take approx 2 weeks to get back.  LMOM to inform pt of this.  Aundra Millet Reynolds LPN  September 12, 2010 10:17 AM   Spoke with pt and advised study not yet read and we will call him once this is done to sched appt to go over resutls. Pt verbalized understanding.  Follow-up by: Vernie Murders,  September 13, 2010 12:33 PM

## 2010-09-21 ENCOUNTER — Other Ambulatory Visit (AMBULATORY_SURGERY_CENTER): Payer: Medicare Other | Admitting: Gastroenterology

## 2010-09-21 ENCOUNTER — Other Ambulatory Visit: Payer: Self-pay | Admitting: Gastroenterology

## 2010-09-21 ENCOUNTER — Encounter: Payer: Self-pay | Admitting: Gastroenterology

## 2010-09-21 DIAGNOSIS — G4733 Obstructive sleep apnea (adult) (pediatric): Secondary | ICD-10-CM

## 2010-09-21 DIAGNOSIS — R1310 Dysphagia, unspecified: Secondary | ICD-10-CM

## 2010-09-21 DIAGNOSIS — K2970 Gastritis, unspecified, without bleeding: Secondary | ICD-10-CM

## 2010-09-21 DIAGNOSIS — B3781 Candidal esophagitis: Secondary | ICD-10-CM

## 2010-09-21 DIAGNOSIS — K2990 Gastroduodenitis, unspecified, without bleeding: Secondary | ICD-10-CM

## 2010-09-21 NOTE — Consult Note (Signed)
NEW PATIENT CONSULTATION  [name], Mason T DOB:  14-May-1933                                       09/20/2010 ZOXWR#:[id number]  The patient presents today for routine follow-up of his extracranial cerebrovascular occlusive disease.  He is well-known to me from prior left carotid endarterectomy for asymptomatic severe stenosis in May 2006.  He has been followed with serial carotid duplex since that time. He has multiple complaints but gets along pretty well.  He does have some difficulty swallowing, which is undergoing an evaluation, and also restless legs syndrome and gout.  He denies any new cardiac disease, did have a myocardial infarction in the past.  He is widowed.  He does not smoke or drink alcohol.  PHYSICAL EXAMINATION:  A well-developed, moderately obese white male in no acute distress.  Blood pressure is 147/67, pulse 72, respirations 24. He is in no acute distress.  HEENT:  Poor dentition, otherwise normal. Chest:  Clear bilaterally.  His carotid arteries are without bruits bilaterally.  He has a well-healed left neck incision.  Heart is regular rate and rhythm.  Abdomen is soft, moderately obese, nontender. Musculoskeletal:  No major deformity or cyanosis.  Neurologic:  No focal weakness, paresthesias.  Skin:  Without ulcers or rashes.  He did undergo a carotid duplex evaluation today.  This reveals a widely- patent endarterectomy on the left and no significant stenosis in his right internal carotid artery.  He does have known external carotid stenosis and I explained to the patient there is no issue regarding this.  He will continue to be followed up on a yearly basis on our vascular lab protocol.    Larina Earthly, M.D. Electronically Signed  TFE/MEDQ  D:  09/20/2010  T:  09/21/2010  Job:  [id number]  cc:   [facility], Ninfa Meeker

## 2010-09-22 ENCOUNTER — Telehealth (INDEPENDENT_AMBULATORY_CARE_PROVIDER_SITE_OTHER): Payer: Self-pay | Admitting: *Deleted

## 2010-09-23 ENCOUNTER — Telehealth: Payer: Self-pay | Admitting: Internal Medicine

## 2010-09-27 NOTE — Procedures (Unsigned)
CAROTID DUPLEX EXAM  INDICATION:  Carotid disease.  HISTORY: Diabetes:  No. Cardiac:  Coronary artery disease, MI. Hypertension:  Yes. Smoking:  No. Previous Surgery:  Left carotid endarterectomy 12/06/2005. CV History:  Asymptomatic. Amaurosis Fugax No, Paresthesias No, Hemiparesis No                                      RIGHT             LEFT Brachial systolic pressure:         142               144 Brachial Doppler waveforms:         Normal            Normal Vertebral direction of flow:        Antegrade         Antegrade DUPLEX VELOCITIES (cm/sec) CCA peak systolic                   90                113 ECA peak systolic                   378               249 ICA peak systolic                   103               62 ICA end diastolic                   20                14 PLAQUE MORPHOLOGY:                  Calcific          Heterogeneous PLAQUE AMOUNT:                      Moderate          Moderate PLAQUE LOCATION:                    ICA, ECA          ECA  IMPRESSION: 1. Right internal carotid artery velocity suggests 1% to 39% stenosis. 2. Patent left carotid endarterectomy site with no evidence of     restenosis of the internal carotid artery. 3. Bilateral external carotid artery stenosis. 4. No significant change when compared to the previous examination.  ___________________________________________ Larina Earthly, M.D.  EM/MEDQ  D:  09/20/2010  T:  09/20/2010  Job:  [id number]

## 2010-09-29 NOTE — Miscellaneous (Signed)
Summary: Diflucan RX.   Clinical Lists Changes  Medications: Added new medication of FLUCONAZOLE 100 MG TABS (FLUCONAZOLE) take two by mouth dasy one and take one by mouth once daily for 13 days. - Signed Rx of FLUCONAZOLE 100 MG TABS (FLUCONAZOLE) take two by mouth dasy one and take one by mouth once daily for 13 days.;  #15 x 0;  Signed;  Entered by: Harlow Mares CMA (AAMA);  Authorized by: Mardella Layman MD [facility];  Method used: Electronically to [facility] [address], [place], [place], [place]  [address], Ph: [phone], Fax: [phone]    Prescriptions: FLUCONAZOLE 100 MG TABS (FLUCONAZOLE) take two by mouth dasy one and take one by mouth once daily for 13 days.  #15 x 0   Entered by:   Harlow Mares CMA (AAMA)   Authorized by:   Mardella Layman MD [facility]   Signed by:   Harlow Mares CMA (AAMA) on 09/21/2010   Method used:   Electronically to        [facility] (retail)       [address]       [place], [place]  [address]       Ph: [phone]       Fax: [phone]   RxID:   [id number]

## 2010-09-29 NOTE — Procedures (Signed)
Summary: Upper Endoscopy  Patient: [name] Note: All result statuses are Final unless otherwise noted.  Tests: (1) Upper Endoscopy (EGD)   EGD Upper Endoscopy       DONE      [facility]     [address]     [place], [place]  [address]           ENDOSCOPY PROCEDURE REPORT           PATIENT:  [name], [name]  MR#:  [id number]     BIRTHDATE:  1933/07/14, 77 yrs. old  GENDER:  male           ENDOSCOPIST:  Vania Rea. Jarold Motto, MD, [facility]     Referred by:  [name], M.D.           PROCEDURE DATE:  09/21/2010     PROCEDURE:  EGD, diagnostic 43235, Maloney Dilation of Esophagus     ASA CLASS:  Class III     INDICATIONS:  dysphagia           MEDICATIONS:   Fentanyl 50 mcg IV, Versed 6 mg IV     TOPICAL ANESTHETIC:           DESCRIPTION OF PROCEDURE:   After the risks benefits and     alternatives of the procedure were thoroughly explained, informed     consent was obtained.  The LB GIF-H180 [id number] endoscope was     introduced through the mouth and advanced to the second portion of     the duodenum, without limitations.  The instrument was slowly     withdrawn as the mucosa was fully examined.     <<PROCEDUREIMAGES>>           Mild gastritis was found in the antrum. CLO BX.  Normal duodenal     folds were noted.  thrush.    Retroflexed views revealed no     abnormalities.    The scope was then withdrawn from the patient     and the procedure completed.           COMPLICATIONS:  None           ENDOSCOPIC IMPRESSION:     1) Mild gastritis in the antrum     2) Normal duodenal folds     3) Thrush     mild candida esophagitis.     RECOMMENDATIONS:     DIFLUCAN 100 MG BID X1,THEN DAILY FOR 10 DAYS.STOP PPI RX.           REPEAT EXAM:  No           ______________________________     Vania Rea. Jarold Motto, MD, [facility]           CC:           n.     eSIGNED:   Vania Rea. Khaylee Mcevoy at 09/21/2010 04:53 PM           Benjamine Sprague, [id number]  Note: An  exclamation mark (!) indicates a result that was not dispersed into the flowsheet. Document Creation Date: 09/21/2010 4:53 PM _______________________________________________________________________  (1) Order result status: Final Collection or observation date-time: 09/21/2010 16:44 Requested date-time:  Receipt date-time:  Reported date-time:  Referring Physician:   Ordering Physician: Sheryn Bison [phone]) Specimen Source:  Source: Launa Grill Order Number: [id number] Lab site:

## 2010-09-29 NOTE — Assessment & Plan Note (Signed)
Summary: [name]   History of Present Illness Visit Type: Follow-up Consult Primary GI MD: Mason Bison MD FACP FAGA Primary Provider: Otho Najjar, MD Requesting Provider: Otho Najjar, MD Chief Complaint: Patient c/o 3 weeks constant solid food dysphagia. There have been no other GI symptoms. History of Present Illness:   [name] with COPD, atherosclerosis and coronary artery disease with previous stent placement, sleep apnea, gouty arthritis, hypertension, and chronic GERD. He is on inhalers for his rather marked COPD, takes daily Plavix and aspirin.  He presents now with progressive solid food dysphagia over the last several months in the upper retropharyngeal area. He denies problem swallowing food. I previously done several endoscopies on this patient last performed within the last year he had rather severe gastritis. I cannot find a report of esophageal dilatations. He does take pantoprazole 40 mg a day regularly. The patient denies lower gastrointestinal or hepatobiliary complaints. Also he says that his breathing is good at this time he denies significant chest pain or cardiovascular difficulties.   GI Review of Systems    Reports dysphagia with solids.      Denies abdominal pain, acid reflux, belching, bloating, chest pain, dysphagia with liquids, heartburn, loss of appetite, nausea, vomiting, vomiting blood, weight loss, and  weight gain.        Denies anal fissure, black tarry stools, change in bowel habit, constipation, diarrhea, diverticulosis, fecal incontinence, heme positive stool, hemorrhoids, irritable bowel syndrome, jaundice, light color stool, liver problems, rectal bleeding, and  rectal pain.    Current Medications (verified): 1)  Metoprolol Tartrate 50 Mg Tabs (Metoprolol Tartrate) .... Take 1 Tablet By Mouth Two Times A Day 2)  Requip 4 Mg Tabs (Ropinirole Hydrochloride) .... Take  1 Tablet By Mouth Once A Day 3)  Remeron 15 Mg Tabs (Mirtazapine) .... At Bedtime 4)  Aspirin 81 Mg Tbec (Aspirin) .... Once Daily 5)  Plavix 75 Mg Tabs (Clopidogrel Bisulfate) .... Take 1 By Mouth Once Daily 6)  Furosemide 40 Mg Tabs (Furosemide) .Marland Kitchen.. 1 By Mouth Two Times A Day  (Or As Directed) 7)  Pravastatin Sodium 20 Mg Tabs (Pravastatin Sodium) .Marland Kitchen.. 1 By Mouth At Bedtime 8)  Vitamin D3 1000 Unit Tabs (Cholecalciferol) .... Take 1 By Mouth Once Daily 9)  Lorazepam 1 Mg Tabs (Lorazepam) .... Take 1 By Mouth Three Times A Day As Needed For Nerves 10)  Gabapentin 300 Mg Caps (Gabapentin) .Marland Kitchen.. 1 By Mouth Two Times A Day 11)  Allopurinol 300 Mg Tabs (Allopurinol) .Marland Kitchen.. 1 By Mouth Once Daily 12)  Nitrostat 0.4 Mg Subl (Nitroglycerin) .... Use As Needed 13)  Oxybutynin Chloride 5 Mg Tabs (Oxybutynin Chloride) .Marland Kitchen.. 1 By Mouth Two Times A Day 14)  Oxycodone Hcl 5 Mg Tabs (Oxycodone Hcl) .Marland Kitchen.. 1 By Mouth Every 12hoursprn For Moderate-Severe Pain 15)  Klor-Con M20 20 Meq Cr-Tabs (Potassium Chloride Crys Cr) .Marland Kitchen.. 1 By Mouth Two Times A Day (Or As Directed) 16)  Colcrys 0.6 Mg Tabs (Colchicine) .... Take 1 Tablet By Mouth Once A Day 17)  Wellbutrin Xl 150 Mg Xr24h-Tab (Bupropion Hcl) .Marland Kitchen.. 1 By Mouth Once Daily 18)  Pantoprazole Sodium 40 Mg Tbec (Pantoprazole Sodium) .Marland Kitchen.. 1 By Mouth Once Daily For Indigestion 19)  Proventil Hfa 108 (90 Base) Mcg/act Aers (Albuterol Sulfate) .... 2 Puffs Every 4 Hours As Needed For Shortness of Breath 20)  E-Z Spacer  Devi (Spacer/aero-Holding [place]) .... Spacer To Use With Albuterol Inhaler As Needed  Allergies: 1)  !  Lipitor (Atorvastatin Calcium) 2)  ! * Uloric  Past History:  Past medical, surgical, family and social histories (including risk factors) reviewed for relevance to current acute and chronic problems.  Past Medical History: Reviewed history from 09/05/2010 and no changes required. Pericarditis MSSA s/ p pericardial window 2007  Hypertension     Hyperlipidemia  Arthritis-gouty    Degenerative disc disease  History of Transient Ischemic attack-2005 History of Left Carotid Endarterectomy Myocardial Infarction-2004, 03/2010  Mild sleep apnea Restless Leg Syndrome Retinal detachment-1994 Cataracts Coronary artery disease     GERD Urinary incontinence, urge   MD roster: cards- [name]  [facility] - [facility]- [facility]. [facility]esh[facility] - [name] GI - [name] pulm - [name]  Past Surgical History: PTCA/stent (01/2009) pericardial window (2007) Nerve in arm-left (2008) right arm surgery cataract extraction-left side x 2, right side Retinal Detachment Repair-left side Shoulder Surgery-bilateral Left Foot Surgery  Family History: Reviewed history from 08/23/2010 and no changes required. Family History Diabetes 1st degree relative (parent)-father Family History of Prostate CA 1st degree relative <50 (other relative): Brother Heart disease (other relative): Mother, Father No FH of Colon Cancer: Family History of Liver Cancer:brother? brother with lung cancer sister with breast cancer  Social History: Reviewed history from 08/23/2010 and no changes required. Never Smoked pt is widowed and lives alone.  pt has children. Retired from [employer] Alcohol use-no Drug use-no Regular exercise-no  Review of Systems       The patient complains of arthritis/joint pain, back pain, fatigue, shortness of breath, sleeping problems, sore throat, swelling of feet/legs, urination - excessive, and urine leakage.  The patient denies allergy/sinus, anemia, anxiety-new, blood in urine, breast changes/lumps, change in vision, confusion, cough, coughing up blood, depression-new, fainting, fever, headaches-new, hearing problems, heart murmur, heart rhythm changes, itching, menstrual pain, muscle pains/cramps, night sweats, nosebleeds, pregnancy symptoms, skin rash, swollen lymph glands, thirst - excessive , urination - excessive  , urination changes/pain, vision changes, and voice change.    Vital Signs:  Patient profile:   77 year old male Height:      69 inches Weight:      246.13 pounds BMI:     36.48 BSA:     2.26 Pulse rate:   68 / minute Pulse rhythm:   regular BP sitting:   130 / 58  (left arm)  Vitals Entered By: Lamona Curl CMA Duncan Dull) (September 20, 2010 10:44 AM)  Physical Exam  General:  Well developed, well nourished, no acute distress.Appearance of COPD and chronic bronchitis with rather marked facial Rhinophyma. Head:  Normocephalic and atraumatic. Eyes:  PERRLA, no icterus.exam deferred to patient's ophthalmologist.   Mouth:  No deformity or lesions, dentition normal. Neck:  Supple; no masses or thyromegaly. Lungs:  Clear throughout to auscultation. Heart:  Regular rate and rhythm; no murmurs, rubs,  or bruits. Abdomen:  Very obese and distended abdomen without any definite organomegaly, masses, tenderness, or evidence of ascites. Bowel sounds are normal. I cannot appreciate an abdominal bruit. Msk:  arthritic changes.   Extremities:  1+ pedal edema.   Neurologic:  Alert and  oriented x4;  grossly normal neurologically. Cervical Nodes:  No significant cervical adenopathy. Psych:  Alert and cooperative. Normal mood and affect.   Impression & Recommendations:  Problem # 1:  DYSPHAGIA UNSPECIFIED (ICD-787.20) Assessment Deteriorated Probable peptic stricture the esophagus associated with chronic GERD---rule out Candida esophagitis per his multiple medical problems, inhalers, and chronic PPI use. I will do his endoscopy and dilatation while  continuing his Plavix and aspirin since he is high risk for cerebrovascular or cardiovascular side effects with anticoagulation discontinuance. Depending on the result of endoscopy he may need esophageal manometry and 24-hour pH probe testing. He appears well compensated from a cardiopulmonary standpoint at this time. He is currently undergoing an  investigation for Gout and has what sounds like planned IV colchicine infusions.  Problem # 2:  OBSTRUCTIVE SLEEP APNEA (ICD-327.23) Assessment: Unchanged  Problem # 3:  CHF (ICD-428.0) Assessment: Improved continue multiple cardiac medications per Dr. Felicity Coyer  Problem # 4:  DIABETES MELLITUS, TYPE II (ICD-250.00) Assessment: Unchanged I cannot see in reviewing his medications where he is on diabetic medications at this time.  Problem # 5:  HYPERTENSION (ICD-401.9) Assessment: Improved blood pressure today normal 130/58 and pulse was 68 and regular. He has marked obesity with a BMI of 36.48. Is not a good candidate for bariatric surgery.  Other Orders: EGD (EGD)  Patient Instructions: 1)  Copy sent to : Mason Najjar, MD 2)  Your procedure has been scheduled for 09/21/2010, please follow the seperate instructions.  3)  [facility] Patient Information Guide given to patient.  4)  Upper Endoscopy brochure given.  5)  The medication list was reviewed and reconciled.  All changed / newly prescribed medications were explained.  A complete medication list was provided to the patient / caregiver. 6)  Please continue current medications.  7)  Avoid foods high in acid content ( tomatoes, citrus juices, spicy foods) . Avoid eating within 3 to 4 hours of lying down or before exercising. Do not over eat; try smaller more frequent meals. Elevate head of bed four inches when sleeping.

## 2010-09-29 NOTE — Progress Notes (Signed)
Summary: need to sched ov with kc   Phone Note Outgoing Call   Call placed by: Arman Filter LPN,  September 22, 2010 12:04 PM Call placed to: Patient Summary of Call: pt needs ov with kc to discuss sleep study results.  called and spoke with pt.  pt scheduled to see kc 09-30-2010 at 4pm.  Arman Filter LPN  September 22, 3662 12:05 PM  Initial call taken by: Arman Filter LPN,  September 22, 2010 12:05 PM

## 2010-09-29 NOTE — Progress Notes (Signed)
Summary: Oxycodone  Phone Note Call from Patient Call back at Home Phone [phone]   Caller: Patient Call For: Mason Lukes MD Summary of Call: Pt needs refill of Oxycodone, pt needs to pick today, please advise/ Initial call taken by: Verdell Face,  September 23, 2010 10:02 AM  Follow-up for Phone Call        ok Follow-up by: Mason Lukes MD,  September 23, 2010 10:32 AM  Additional Follow-up for Phone Call Additional follow up Details #1::        Pt advised, Rx in cabinet for pt pick up Additional Follow-up by: Margaret Pyle, CMA,  September 23, 2010 10:48 AM    Prescriptions: OXYCODONE HCL 5 MG TABS (OXYCODONE HCL) 1 by mouth every 12hoursprn for moderate-severe pain  #60 x 0   Entered by:   Margaret Pyle, CMA   Authorized by:   Mason Lukes MD   Signed by:   Margaret Pyle, CMA on 09/23/2010   Method used:   Print then Give to Patient   RxID:   [id number]

## 2010-09-29 NOTE — Miscellaneous (Signed)
Summary: Orders Update  Clinical Lists Changes  Orders: Added new Test order of TLB-H Pylori Screen Gastric Biopsy (83013-CLOTEST) - Signed 

## 2010-09-29 NOTE — Letter (Signed)
Summary: EGD Instructions  [facility]  [address] [place], [place] [address]   Phone: [phone]  Fax: [phone]       [name]    [birth year]    MRN: [id number]       Procedure Day Dorna Bloom: Wednesday 09/21/2010     Arrival Time: 3:00pm     Procedure Time: 4:00pm     Location of Procedure:                    [facility] (4th Floor)   PREPARATION FOR ENDOSCOPY   On 09/21/2010 THE DAY OF THE PROCEDURE:  1.   No solid foods, milk or milk products are allowed after midnight the night before your procedure.  2.   Do not drink anything colored red or purple.  Avoid juices with pulp.  No orange juice.  3.  You may drink clear liquids until 2:00pm, which is 2 hours before your procedure.                                                                                                CLEAR LIQUIDS INCLUDE: Water Jello Ice Popsicles Tea (sugar ok, no milk/cream) Powdered fruit flavored drinks Coffee (sugar ok, no milk/cream) Gatorade Juice: apple, white grape, white cranberry  Lemonade Clear bullion, consomm, broth Carbonated beverages (any kind) Strained chicken noodle soup Hard Candy   MEDICATION INSTRUCTIONS  Unless otherwise instructed, you should take regular prescription medications with a small sip of water as early as possible the morning of your procedure.  Diabetic patients - see separate instructions.              OTHER INSTRUCTIONS  You will need a responsible adult at least 75 years of age to accompany you and drive you home.   This person must remain in the waiting room during your procedure.  Wear loose fitting clothing that is easily removed.  Leave jewelry and other valuables at home.  However, you may wish to bring a book to read or an iPod/MP3 player to listen to music as you wait for your procedure to start.  Remove all body piercing jewelry and leave at home.  Total time from sign-in until discharge  is approximately 2-3 hours.  You should go home directly after your procedure and rest.  You can resume normal activities the day after your procedure.  The day of your procedure you should not:   Drive   Make legal decisions   Operate machinery   Drink alcohol   Return to work  You will receive specific instructions about eating, activities and medications before you leave.    The above instructions have been reviewed and explained to me by   _______________________    I fully understand and can verbalize these instructions _____________________________ Date _________     Appended Document: EGD Instructions pt advised to continue plavix per DRP./

## 2010-09-30 ENCOUNTER — Ambulatory Visit (INDEPENDENT_AMBULATORY_CARE_PROVIDER_SITE_OTHER): Payer: Medicare Other | Admitting: Pulmonary Disease

## 2010-09-30 ENCOUNTER — Encounter: Payer: Self-pay | Admitting: Pulmonary Disease

## 2010-09-30 DIAGNOSIS — G4733 Obstructive sleep apnea (adult) (pediatric): Secondary | ICD-10-CM

## 2010-10-04 LAB — GLUCOSE, CAPILLARY
Glucose-Capillary: 116 mg/dL — ABNORMAL HIGH (ref 70–99)
Glucose-Capillary: 118 mg/dL — ABNORMAL HIGH (ref 70–99)

## 2010-10-06 LAB — BASIC METABOLIC PANEL
BUN: 29 mg/dL — ABNORMAL HIGH (ref 6–23)
CO2: 28 mEq/L (ref 19–32)
CO2: 29 mEq/L (ref 19–32)
CO2: 31 mEq/L (ref 19–32)
Calcium: 8.9 mg/dL (ref 8.4–10.5)
Calcium: 9.4 mg/dL (ref 8.4–10.5)
Chloride: 101 mEq/L (ref 96–112)
Chloride: 104 mEq/L (ref 96–112)
Chloride: 106 mEq/L (ref 96–112)
Creatinine, Ser: 1.18 mg/dL (ref 0.4–1.5)
Creatinine, Ser: 1.18 mg/dL (ref 0.4–1.5)
Creatinine, Ser: 1.24 mg/dL (ref 0.4–1.5)
GFR calc Af Amer: >60 mL/min (ref >60)
GFR calc Af Amer: >60 mL/min (ref >60)
GFR calc Af Amer: >60 mL/min (ref >60)
Glucose, Bld: 120 mg/dL — ABNORMAL HIGH (ref 70–99)
Glucose, Bld: 126 mg/dL — ABNORMAL HIGH (ref 70–99)

## 2010-10-06 LAB — TROPONIN I: Troponin I: 0.21 ng/mL — ABNORMAL HIGH (ref 0.00–0.06)

## 2010-10-06 LAB — CBC
HCT: 40.3 % (ref 39.0–52.0)
HCT: 44.4 % (ref 39.0–52.0)
Hemoglobin: 14.5 g/dL (ref 13.0–17.0)
MCH: 30.1 pg (ref 26.0–34.0)
MCH: 30.1 pg (ref 26.0–34.0)
MCH: 30.5 pg (ref 26.0–34.0)
MCHC: 31.8 g/dL (ref 30.0–36.0)
MCHC: 32 g/dL (ref 30.0–36.0)
MCV: 94.6 fL (ref 78.0–100.0)
MCV: 95.3 fL (ref 78.0–100.0)
Platelets: 161 10*3/uL (ref 150–400)
Platelets: 162 10*3/uL (ref 150–400)
Platelets: 164 10*3/uL (ref 150–400)
RBC: 4.62 MIL/uL (ref 4.22–5.81)
RBC: 4.81 MIL/uL (ref 4.22–5.81)
RDW: 14.9 % (ref 11.5–15.5)
RDW: 15.4 % (ref 11.5–15.5)
RDW: 15.6 % — ABNORMAL HIGH (ref 11.5–15.5)
WBC: 14.8 10*3/uL — ABNORMAL HIGH (ref 4.0–10.5)
WBC: 8.1 10*3/uL (ref 4.0–10.5)

## 2010-10-06 LAB — POCT CARDIAC MARKERS
Myoglobin, poc: 107 ng/mL (ref 12–200)
Myoglobin, poc: 107 ng/mL (ref 12–200)
Troponin i, poc: <0.05 ng/mL (ref 0.00–0.09)
Troponin i, poc: <0.05 ng/mL (ref 0.00–0.09)

## 2010-10-06 LAB — D-DIMER, QUANTITATIVE: D-Dimer, Quant: 0.57 ug/mL-FEU — ABNORMAL HIGH (ref 0.00–0.48)

## 2010-10-06 LAB — COMPREHENSIVE METABOLIC PANEL
ALT: 26 U/L (ref 0–53)
AST: 39 U/L — ABNORMAL HIGH (ref 0–37)
Albumin: 3.8 g/dL (ref 3.5–5.2)
Alkaline Phosphatase: 68 U/L (ref 39–117)
GFR calc Af Amer: 37 mL/min — ABNORMAL LOW (ref >60)
Potassium: 4.4 mEq/L (ref 3.5–5.1)
Sodium: 140 mEq/L (ref 135–145)
Total Protein: 7.3 g/dL (ref 6.0–8.3)

## 2010-10-06 LAB — LIPID PANEL
Cholesterol: 187 mg/dL (ref 0–200)
LDL Cholesterol: 115 mg/dL — ABNORMAL HIGH (ref 0–99)
Triglycerides: 154 mg/dL — ABNORMAL HIGH (ref <150)
VLDL: 31 mg/dL (ref 0–40)

## 2010-10-06 LAB — CARDIAC PANEL(CRET KIN+CKTOT+MB+TROPI)
CK, MB: 10 ng/mL (ref 0.3–4.0)
CK, MB: 12.3 ng/mL (ref 0.3–4.0)
Relative Index: 4.4 — ABNORMAL HIGH (ref 0.0–2.5)
Total CK: 179 U/L (ref 7–232)
Total CK: 229 U/L (ref 7–232)
Troponin I: 0.96 ng/mL (ref 0.00–0.06)

## 2010-10-06 LAB — DIFFERENTIAL
Basophils Relative: 0 % (ref 0–1)
Eosinophils Absolute: 0 10*3/uL (ref 0.0–0.7)
Lymphs Abs: 0.6 10*3/uL — ABNORMAL LOW (ref 0.7–4.0)
Monocytes Absolute: 0.1 10*3/uL (ref 0.1–1.0)
Monocytes Relative: 1 % — ABNORMAL LOW (ref 3–12)

## 2010-10-06 LAB — HEPARIN LEVEL (UNFRACTIONATED)
Heparin Unfractionated: 0.21 IU/mL — ABNORMAL LOW (ref 0.30–0.70)
Heparin Unfractionated: 0.49 IU/mL (ref 0.30–0.70)

## 2010-10-11 LAB — BASIC METABOLIC PANEL
BUN: 23 mg/dL (ref 6–23)
BUN: 43 mg/dL — ABNORMAL HIGH (ref 6–23)
CO2: 26 mEq/L (ref 19–32)
Calcium: 9.3 mg/dL (ref 8.4–10.5)
Calcium: 9.3 mg/dL (ref 8.4–10.5)
Creatinine, Ser: 1.65 mg/dL — ABNORMAL HIGH (ref 0.4–1.5)
GFR calc non Af Amer: 41 mL/min — ABNORMAL LOW (ref >60)
GFR calc non Af Amer: >60 mL/min (ref >60)
Glucose, Bld: 102 mg/dL — ABNORMAL HIGH (ref 70–99)
Glucose, Bld: 95 mg/dL (ref 70–99)
Sodium: 137 mEq/L (ref 135–145)

## 2010-10-11 LAB — POCT CARDIAC MARKERS
CKMB, poc: 5.7 ng/mL (ref 1.0–8.0)
Myoglobin, poc: 401 ng/mL (ref 12–200)

## 2010-10-11 LAB — URINALYSIS, ROUTINE W REFLEX MICROSCOPIC
Glucose, UA: NEGATIVE mg/dL
Ketones, ur: NEGATIVE mg/dL
pH: 5 (ref 5.0–8.0)

## 2010-10-11 LAB — URINE MICROSCOPIC-ADD ON

## 2010-10-11 LAB — URIC ACID: Uric Acid, Serum: 4.9 mg/dL (ref 4.0–7.8)

## 2010-10-11 LAB — DIFFERENTIAL
Basophils Absolute: 0 10*3/uL (ref 0.0–0.1)
Lymphocytes Relative: 30 % (ref 12–46)
Neutro Abs: 3.4 10*3/uL (ref 1.7–7.7)
Neutrophils Relative %: 55 % (ref 43–77)

## 2010-10-11 LAB — CBC
Platelets: 136 10*3/uL — ABNORMAL LOW (ref 150–400)
RDW: 16.1 % — ABNORMAL HIGH (ref 11.5–15.5)

## 2010-10-11 NOTE — Letter (Signed)
Summary: [facility]   Imported By: Sherian Rein 10/06/2010 11:36:46  _____________________________________________________________________  External Attachment:    Type:   Image     Comment:   External Document

## 2010-10-20 NOTE — Assessment & Plan Note (Signed)
Summary: ov to discuss sleep study results/mg   Copy to:  Otho Najjar, MD Primary Provider/Referring Provider:  Otho Najjar, MD  CC:  Ov to discuss sleep study results. .  History of Present Illness: the pt comes in today for f/u of his recent sleep study.  He had very fragmented sleep, with AHI 41/hr and desat to 86%.  I have reviewed the study in detail with him,and answered all of his questions.   Current Medications (verified): 1)  Metoprolol Tartrate 50 Mg Tabs (Metoprolol Tartrate) .... Take 1 Tablet By Mouth Two Times A Day 2)  Requip 4 Mg Tabs (Ropinirole Hydrochloride) .... Take 1 Tablet By Mouth Once A Day 3)  Remeron 15 Mg Tabs (Mirtazapine) .... At Bedtime 4)  Aspirin 81 Mg Tbec (Aspirin) .... Once Daily 5)  Plavix 75 Mg Tabs (Clopidogrel Bisulfate) .... Take 1 By Mouth Once Daily 6)  Furosemide 40 Mg Tabs (Furosemide) .Marland Kitchen.. 1 By Mouth Two Times A Day  (Or As Directed) 7)  Pravastatin Sodium 20 Mg Tabs (Pravastatin Sodium) .Marland Kitchen.. 1 By Mouth At Bedtime 8)  Vitamin D3 1000 Unit Tabs (Cholecalciferol) .... Take 1 By Mouth Once Daily 9)  Lorazepam 1 Mg Tabs (Lorazepam) .... Take 1 By Mouth Three Times A Day As Needed For Nerves 10)  Gabapentin 300 Mg Caps (Gabapentin) .Marland Kitchen.. 1 By Mouth Two Times A Day 11)  Nitrostat 0.4 Mg Subl (Nitroglycerin) .... Use As Needed 12)  Oxybutynin Chloride 5 Mg Tabs (Oxybutynin Chloride) .Marland Kitchen.. 1 By Mouth Two Times A Day 13)  Oxycodone Hcl 5 Mg Tabs (Oxycodone Hcl) .Marland Kitchen.. 1 By Mouth Every 12hoursprn For Moderate-Severe Pain 14)  Klor-Con M20 20 Meq Cr-Tabs (Potassium Chloride Crys Cr) .Marland Kitchen.. 1 By Mouth Two Times A Day (Or As Directed) 15)  Colcrys 0.6 Mg Tabs (Colchicine) .... Take 1 Tablet By Mouth Once A Day 16)  Wellbutrin Xl 150 Mg Xr24h-Tab (Bupropion Hcl) .Marland Kitchen.. 1 By Mouth Once Daily 17)  Proventil Hfa 108 (90 Base) Mcg/act Aers (Albuterol Sulfate) .... 2 Puffs Every 4 Hours As Needed For Shortness of Breath 18)  E-Z Spacer  Devi  (Spacer/aero-Holding [place]) .... Spacer To Use With Albuterol Inhaler As Needed 19)  Fluconazole 100 Mg Tabs (Fluconazole) .... Take Two By Mouth Dasy One and Take One By Mouth Once Daily For 13 Days. 20)  Krystexxa 8 Mg/ml Soln (Pegloticase) .... Iv 2 X Per Month For Gout  Allergies (verified): 1)  ! Lipitor (Atorvastatin Calcium) 2)  ! * Uloric  Review of Systems       The patient complains of acid heartburn, indigestion, difficulty swallowing, and joint stiffness or pain.  The patient denies shortness of breath with activity, shortness of breath at rest, productive cough, non-productive cough, coughing up blood, chest pain, irregular heartbeats, loss of appetite, weight change, abdominal pain, sore throat, tooth/dental problems, headaches, nasal congestion/difficulty breathing through nose, sneezing, itching, ear ache, anxiety, depression, hand/feet swelling, rash, change in color of mucus, and fever.    Vital Signs:  Patient profile:   77 year old male Height:      69 inches Weight:      237.50 pounds BMI:     35.20 O2 Sat:      95 % on Room air Pulse rate:   77 / minute BP sitting:   120 / 68  (left arm) Cuff size:   large  Vitals Entered By: Arman Filter LPN (September 30, 3242 4:10 PM)  O2 Flow:  Room  air CC: Ov to discuss sleep study results.  Comments Medications reviewed with patient Arman Filter LPN  September 30, 5407 4:15 PM    Physical Exam  General:  ow male in nad  Extremities:  mild edema, no cyanosis  Neurologic:  alert, does not appear to be sleepy, moves all 4    Impression & Recommendations:  Problem # 1:  OBSTRUCTIVE SLEEP APNEA (ICD-327.23) the pt has severe osa by his sleep study, and would be best served by treatment with cpap while working on weight reduction.  The pt is agreeable to trying.  I will set the patient up on cpap at a moderate pressure level to allow for desensitization, and will troubleshoot the device over the next 4-6weeks if needed.   The pt is to call me if having issues with tolerance.  Will then optimize the pressure once patient is able to wear cpap on a consistent basis.  Medications Added to Medication List This Visit: 1)  Krystexxa 8 Mg/ml Soln (Pegloticase) .... Iv 2 x per month for gout  Other Orders: Est. Patient Level III (81191) DME Referral (DME)  Patient Instructions: 1)  will set up on cpap.  Please call me if you are having issues with tolerance. 2)  work on weight loss 3)  followup with me in 5weeks.

## 2010-10-26 ENCOUNTER — Encounter: Payer: Self-pay | Admitting: Internal Medicine

## 2010-10-30 LAB — BASIC METABOLIC PANEL
Calcium: 9.3 mg/dL (ref 8.4–10.5)
Creatinine, Ser: 1.25 mg/dL (ref 0.4–1.5)
GFR calc Af Amer: >60 mL/min (ref >60)
GFR calc non Af Amer: 56 mL/min — ABNORMAL LOW (ref >60)
Glucose, Bld: 98 mg/dL (ref 70–99)
Sodium: 139 mEq/L (ref 135–145)

## 2010-10-30 LAB — CBC
Hemoglobin: 14.7 g/dL (ref 13.0–17.0)
MCHC: 33.9 g/dL (ref 30.0–36.0)
MCV: 90.2 fL (ref 78.0–100.0)
RDW: 13.9 % (ref 11.5–15.5)

## 2010-11-03 ENCOUNTER — Encounter: Payer: Self-pay | Admitting: Pulmonary Disease

## 2010-11-11 ENCOUNTER — Encounter: Payer: Self-pay | Admitting: Pulmonary Disease

## 2010-11-11 ENCOUNTER — Ambulatory Visit (INDEPENDENT_AMBULATORY_CARE_PROVIDER_SITE_OTHER): Payer: Medicare Other | Admitting: Pulmonary Disease

## 2010-11-11 VITALS — BP 130/70 | HR 79 | Temp 98.3°F | Ht 69.0 in | Wt 244.0 lb

## 2010-11-11 DIAGNOSIS — G4733 Obstructive sleep apnea (adult) (pediatric): Secondary | ICD-10-CM

## 2010-11-11 NOTE — Assessment & Plan Note (Signed)
The pt is doing very well with cpap, and has seen improved sleep and daytime alertness.  He has no mask or pressure issues.  I have discussed with the pt the need to optimize pressure for him.  Will do this thru the auto mode at home. Care Plan:  At this point, will arrange for the patient's machine to be changed over to auto mode for 2 weeks to optimize their pressure.  I will review the downloaded data once sent by dme, and also evaluate for compliance, leaks, and residual osa.  I will call the patient and dme to discuss the results, and have the patient's machine set appropriately.  This will serve as the pt's cpap pressure titration.

## 2010-11-11 NOTE — Progress Notes (Signed)
  Subjective:    Patient ID: [name], male    DOB: [birth year], 77 y.o.   MRN: [id number]  HPI The pt comes in today for f/u of his known osa.  He has severe osa, and was started on cpap at the last visit.  He has done very well with this, and his download shows great compliance.  He denies any issues with mask fit or pressure, and has been sleeping much better.  He is much more rested in the am's upon arising.    Review of Systems  Constitutional: Negative for fever and unexpected weight change.  HENT: Negative for ear pain, nosebleeds, congestion, sore throat, rhinorrhea, sneezing, trouble swallowing, dental problem, postnasal drip and sinus pressure.   Eyes: Negative for redness and itching.  Respiratory: Positive for shortness of breath. Negative for cough, chest tightness and wheezing.   Cardiovascular: Positive for leg swelling. Negative for palpitations.  Gastrointestinal: Negative for nausea and vomiting.  Genitourinary: Negative for dysuria.  Musculoskeletal: Positive for joint swelling.  Skin: Negative for rash.  Neurological: Negative for headaches.  Hematological: Bruises/bleeds easily.  Psychiatric/Behavioral: Positive for dysphoric mood. The patient is not nervous/anxious.        Objective:   Physical Exam Ow male in nad No skin breakdown or pressure necrosis from cpap mask  Chest clear LE with 1+ edema, no cyanosis Alert, does not appear sleepy, moves all 4        Assessment & Plan:

## 2010-11-11 NOTE — Patient Instructions (Signed)
Stay on cpap.  You are doing well Will have machine changed over to the automatic mode for the next 2 weeks to optimize pressure for you.  Will let you know the pressure once I receive the report Work on weight loss followup with me in 6mos

## 2010-12-06 NOTE — Procedures (Signed)
CAROTID DUPLEX EXAM   INDICATION:  Follow-up carotid disease.   HISTORY:  Diabetes:  No.  Cardiac:  CAD, MI.  Hypertension:  Yes.  Smoking:  No.  Previous Surgery:  Left carotid endarterectomy on 12/06/2005.  CV History:  Currently asymptomatic.  Amaurosis Fugax No, Paresthesias No, Hemiparesis No.                                       RIGHT             LEFT  Brachial systolic pressure:         164               160  Brachial Doppler waveforms:         Normal            Normal  Vertebral direction of flow:        Antegrade         Antegrade  DUPLEX VELOCITIES (cm/sec)  CCA peak systolic                   99                169  ECA peak systolic                   342               378  ICA peak systolic                   140               93  ICA end diastolic                   17                10  PLAQUE MORPHOLOGY:                  Mixed             Heterogeneous  PLAQUE AMOUNT:                      Moderate          Moderate  PLAQUE LOCATION:                    ICA/ECA/CCA       ECA   IMPRESSION:  1. A 40% to 59% stenosis of the right internal carotid artery.  2. Patent left carotid endarterectomy site with no evidence of an      internal carotid artery stenosis.  3. Mild increase in Doppler velocities of the right internal carotid      artery noted when compared to the previous exam on 07/01/2007, with      the left internal carotid artery remaining stable.   ___________________________________________  Larina Earthly, M.D.   CH/MEDQ  D:  09/18/2008  T:  09/18/2008  Job:  [id number]

## 2010-12-06 NOTE — Procedures (Signed)
CAROTID DUPLEX EXAM   INDICATION:  Followup of known carotid artery disease.  Patient is  asymptomatic.   HISTORY:  Diabetes:  No.  Cardiac:  CAD.  MI 5 years ago.  Hypertension:  Yes.  Smoking:  No.  Previous Surgery:  Left CEA with DPA on 12/06/2005 by Dr. Arbie Cookey.  CV History:  Amaurosis Fugax :  No, Paresthesias:  No, Hemiparesis:  No                                         RIGHT             LEFT  Brachial systolic pressure:         170               164  Brachial Doppler waveforms:         WNL               WNL  Vertebral direction of flow:        Antegrade         Antegrade  DUPLEX VELOCITIES (cm/sec)  CCA peak systolic                   55                92  ECA peak systolic                   311               188  ICA peak systolic                   77                46  ICA end diastolic                   14                13  PLAQUE MORPHOLOGY:                  Mixed, calcific   N/A  PLAQUE AMOUNT:                      Mild              N/A  PLAQUE LOCATION:                    Bifurcation, ICA, ECA               N/A        IMPRESSION:  1.  Right 1% to 39% internal carotid artery stenosis.  2.  Left internal carotid artery without recurrent stenosis, status post  carotid endarterectomy.  3.  Bilateral external carotid artery stenosis, right > left.  4.  Study essentially unchanged from that on 06/25/2006.    ___________________________________________  Larina Earthly, M.D.   PB/MEDQ  D:  07/01/2007  T:  07/02/2007  Job:  [id number]

## 2010-12-06 NOTE — Cardiovascular Report (Signed)
NAMEJAXTEN, BROSH NO.:  [id number]   MEDICAL RECORD NO.:  [id number]          PATIENT TYPE:  OIB   LOCATION:  2899                         FACILITY:  [facility]   PHYSICIAN:  Georga Hacking, M.D.DATE OF BIRTH:  04-Nov-1932   DATE OF PROCEDURE:  02/11/2009  DATE OF DISCHARGE:                            CARDIAC CATHETERIZATION   REASON FOR CATHETERIZATION:  Dyspnea, abnormal thallium test showing  transient cavity dilation and inferolateral ischemia.   PROCEDURE:  Left heart catheterization with coronary angiograms and left  ventriculogram.   PROCEDURE:  The patient tolerated the procedure well without  complications.  The right femoral artery was entered using a single  anterior needle wall stick.  A 5-French sheath was placed in right  femoral artery and catheters used were 5-French catheters.  The aortic  valve was crossed with a standard pigtail, but did not require a  guidewire to cross the valve, rather guidewire supported the pigtail.  At the end of the procedure, the films were reviewed with Dr. Excell Seltzer,  and the decision was made to keep the patient on the table for  percutaneous intervention of the circumflex coronary artery.  He  tolerated the procedure well.   HEMODYNAMIC DATA:  Aorta postcontrast 143/58, LV postcontrast 158-164/10-  20.   ANGIOGRAPHIC DATA:  Left ventriculogram:  Performed in the 30 degrees  RAO projection.  The aortic valve was not significantly calcified.  There was no mitral regurgitation noted.  The left ventricle was normal  in size.  There was increased trabeculations consistent with left  ventricular hypertrophy.  Estimated ejection fraction was around 70%.  Coronary arteries arise and distribute normally.  Heavy coronary  calcification is noted.  The system is left dominant and mildly  codominant.  The left main coronary artery was calcified with mild  irregularity.  Left anterior descending was heavily calcified  proximally  with diffuse 30-40% proximal stenosis.  There was no severe focal  obstructive stenoses noted in the LAD.  Circumflex coronary artery has a  weblike eccentric focal stenosis proximally prior to a large first  marginal branch as well as a continuation branch which then supplies the  posterior descending and posterolateral branches.  This stenosis is  somewhat difficult to lay out.  The first marginal branch has an area of  haziness and it is hard to tell the severity of the stenosis there.  It  appears to be moderate, at least 70%.  Continuation branch is probably  not significantly narrowed, but appears mildly hazy.  The right coronary  artery has an ostial 50% stenosis, is codominant.   IMPRESSION:  1. Mild aortic valve disease with mild gradient across aortic valve      but good opening.  2. Normal left ventricular function.  3. Significant proximal circumflex coronary artery disease with      possible severe stenosis involving the origin of the first marginal      branch.   RECOMMENDATIONS:  Dr. Excell Seltzer will consider percutaneous intervention and  intravascular ultrasound of the vessel.  The patient will be taken off  the table and returned for  intervention at a later time.  He will be  managed in the holding area with sheath.      Georga Hacking, M.D.  Electronically Signed     WST/MEDQ  D:  02/11/2009  T:  02/11/2009  Job:  [id number]   cc:   Aida Puffer

## 2010-12-06 NOTE — Procedures (Signed)
CAROTID DUPLEX EXAM   INDICATION:  Carotid disease.   HISTORY:  Diabetes:  No.  Cardiac:  CAD, MI.  Hypertension:  Yes.  Smoking:  No.  Previous Surgery:  Left carotid endarterectomy on 12/06/2005.  CV History:  Currently asymptomatic.  Amaurosis Fugax No, Paresthesias No, Hemiparesis No                                       RIGHT             LEFT  Brachial systolic pressure:         146               148  Brachial Doppler waveforms:         Normal            Normal  Vertebral direction of flow:        Antegrade         Antegrade  DUPLEX VELOCITIES (cm/sec)  CCA peak systolic                   83                137  ECA peak systolic                   410               266  ICA peak systolic                   135               51  ICA end diastolic                   19                8  PLAQUE MORPHOLOGY:                  Calcific          Heterogeneous  PLAQUE AMOUNT:                      Moderate          Moderate  PLAQUE LOCATION:                    ICA / ECA         ECA   IMPRESSION:  1. 40%-59% stenosis of the right internal carotid artery.  2. Patent left carotid endarterectomy site with no evidence of left      internal carotid artery stenosis.  3. Bilateral external carotid artery stenoses noted.  4. No significant change noted when compared to the previous exam on      09/18/2008.   ___________________________________________  Larina Earthly, M.D.   CH/MEDQ  D:  08/27/2009  T:  08/27/2009  Job:  [id number]

## 2010-12-06 NOTE — Op Note (Signed)
NAMEALYAN, [name]             ACCOUNT NO.:  [id number]   MEDICAL RECORD NO.:  [id number]          PATIENT TYPE:  AMB   LOCATION:  SDS                          FACILITY:  [facility]   PHYSICIAN:  Claude Manges. 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   PROCEDURES:  1. Diagnostic arthroscopy, left shoulder, with debridement.  2. Arthroscopic subacromial decompression.  3. Arthroscopic distal clavicle resection.   SURGEON:  Claude Manges. Cleophas Dunker, M.D.   ASSISTANT:  Arlys John D. Petrarca, P.A.-C.   ANESTHESIA:  General with supplemental interscalene nerve block.   COMPLICATIONS:  None.   HISTORY:  A 77 year old gentleman has had problems off and on with his  left shoulder for approximately 5 months.  The pain has recently become  progressive to the point of limited range of motion and pain.  He has  evidence of degenerative changes of the [facility] joint and the glenohumeral  joint by MRI scan but surprisingly little degenerative change by plain  film.  Clinically he has impingement and degenerative change with pain  at the [facility] joint.  He is now to have an arthroscopic evaluation.   PROCEDURE:  With the patient comfortable on the operating table and  under general orotracheal anesthesia, the patient was placed in a semi-  sitting position with the shoulder frame.  He did receive a preoperative  interscalene nerve block.  With the patient in the semi-sitting  position, his left shoulder was then prepped with DuraPrep from the base  of the neck circumferentially to below the elbow.  Sterile draping was  performed.   A  marking pen was used to outline the [facility] joint, the coracoid and the  acromion.  At a point a fingerbreadth posterior and medial to the  posterior angle of the acromion, a small stab wound was made, the  arthroscope easily placed in the shoulder joint.  Diagnostic arthroscopy  revealed diffuse articular cartilage thinning and areas of full-  thickness loss on the humeral head and to a much lesser extent on the  glenoid.  There was degenerative tearing of the anterior labrum  anteriorly and diffuse synovitis of the biceps tendon and a few areas of  tearing but otherwise was intact.   A second portal was established anteriorly into the joint and shaving  was then performed with the Cuda shaver and the ArthroCare wand.  I  shaved the torn labrum as well as the area of synovitis and any loose  areas of articular cartilage of the humeral head.   The arthroscope was then placed in the subacromial space posteriorly,  the cannula in the subacromial space anteriorly and a third portal  established in the lateral subacromial space.  There was considerable  bursal material that was debrided with the ArthroCare wand.  There was  obvious  thickening of the acromion both laterally and anteriorly with  impingement.  A 6-mm bur was used to perform a subacromial  decompression, debriding both the anterior and the lateral acromion so  that I had a nice decompression.  The [facility] joint was obviously inflamed.  There were areas of synovitis.  These were debrided with the ArthroCare  wand, then a distal clavicle resection was performed with a 6-mm bur  with a very nice squared resection.  The subacromial space was copiously  irrigated with saline solution.  The instruments were removed.  The  anterior and lateral portals were closed with interrupted Ethilon.  The  posterior wound was left open.  A sterile bulky dressing was applied,  followed by a sling.   PLAN:  Observation overnight, discharge in the a.m.   Percocet for pain.      Claude Manges. Cleophas Dunker, M.D.  Electronically Signed     PWW/MEDQ  D:  09/26/2007  T:  09/27/2007  Job:  [id number]

## 2010-12-06 NOTE — Discharge Summary (Signed)
[name], [name]             ACCOUNT NO.:  [id number]   MEDICAL RECORD NO.:  [id number]          PATIENT TYPE:  INP   LOCATION:  2503                         FACILITY:  [facility]   PHYSICIAN:  Georga Hacking, M.D.DATE OF BIRTH:  [birth year]   DATE OF ADMISSION:  02/11/2009  DATE OF DISCHARGE:  02/12/2009                               DISCHARGE SUMMARY   FINAL DIAGNOSES:  1. Coronary artery disease with progressive severe stenosis involving      the circumflex coronary artery.      a.     Drug-eluting stent placed in this vessel by Dr. Excell Seltzer this       admission with an excellent result.      b.     Residual calcific disease of moderate severity involving the       left anterior descending artery and the ostium of a nondominant       right coronary artery.  2. Hypertensive heart disease.  3. Mild aortic stenosis.  4. Obesity.  5. Tophaceous gout.  6. Hyperlipidemia, under treatment.  7. History of carotid artery disease with previous stroke.   PROCEDURES:  Cardiac catheterization, stenting of the circumflex  coronary artery with Xience 5 drug-eluting stent 2.5 mm.   HISTORY:  This 77 year old male has a complicated history with vascular  disease.  He has had moderate 3-vessel coronary artery disease  previously and later had staph pericarditis with drainage and a  pericardial window in 2007.  He has a previous stroke and a previous  carotid endarterectomy.  He recently developed worsening dyspnea with  exertion had a thallium test showing transient cavity dilation and  evidence of inferolateral ischemia.  He has not really had much angina.  He is admitted at this time for catheterization.   HOSPITAL COURSE:  Laboratory data done as an outpatient showed normal  CBC, PT and PTT.  Chemistry panel showed a glucose of 93, BUN 33,  creatinine 1.42, potassium of 5, chloride 111, CO2 21, sodium 144, and  normal liver enzymes.  Chest x-ray was unremarkable.  The patient was  brought in for same-day catheterization.  He had a mild 10-15-mm  gradient across the aortic valve but the valve was crossed easily.  Left  ventricular function was normal with evidence of LVH.  He had a normal  left main coronary artery.  The LAD was heavily calcified with moderate  30-40% proximal stenosis.  Circumflex had a web-like severe stenosis in  the proximal circumflex and also a severe 90% stenosis at the origin of  the first marginal branch.  The right coronary artery was codominant or  nondominant, had an ostial 50% stenosis.  Dr. Tonny Bollman scrubbed  into the case and the patient underwent percutaneous intervention with a  Xience 5 drug-eluting stent 2.5 mm with a good result.  The patient had  dilation into the marginal branch.  He tolerated the procedure well.  The sheath was removed later that day and he was in stable condition the  next morning.  He was loaded with Plavix, then initiated on Plavix with  instructions not to  discontinue Plavix.  In addition, he had amlodipine  added to his regimen for control of blood pressure.   He is in improved condition on:  1. Colchicine 0.6 mg b.i.d.  2. Lorazepam 2 mg daily as needed.  3. Mirtazapine 15 mg daily.  4. Simvastatin 40 mg daily.  5. Mirapex 1.5 mg q.h.s.  6. Lisinopril 40 mg daily.  7. Metoprolol 50 mg b.i.d.  8. Temazepam 30 mg q.h.s. as needed.  9. Allopurinol 100 mg b.i.d.  10.Vitamin D 50,000 units capsule weekly.  11.Gabapentin 100 mg b.i.d.  12.Aspirin 325 mg daily.  13.Plavix 75 mg daily.  14.Amlodipine 5 mg daily.  15.Nitroglycerin 1/150 sublingual p.r.n.   He was given instructions concerning the care of the cath site, is to  exercise regularly, follow a carbohydrate-restricted diet and lose  weight.  He is to follow up with me in 1 week.      Georga Hacking, M.D.  Electronically Signed     WST/MEDQ  D:  02/12/2009  T:  02/12/2009  Job:  [id number]   cc:   Aida Puffer

## 2010-12-06 NOTE — Cardiovascular Report (Signed)
[name], [name]             ACCOUNT NO.:  [id number]   MEDICAL RECORD NO.:  [id number]          PATIENT TYPE:  INP   LOCATION:  2503                         FACILITY:  [facility]   PHYSICIAN:  Veverly Fells. Excell Seltzer, MD  DATE OF BIRTH:  1933/06/19   DATE OF PROCEDURE:  DATE OF DISCHARGE:                            CARDIAC CATHETERIZATION   PROCEDURE:  1. Intracoronary vascular ultrasound.  2. Percutaneous transluminal coronary angioplasty.  3. Stenting of the left circumflex obtuse marginal branch.  4. Percutaneous transluminal coronary angioplasty of the      atrioventricular groove circumflex.   INDICATIONS:  [name] is a 77 year old gentleman with exertional  angina and abnormal Myoview scan.  The patient underwent cardiac cath  and coronary angiography by Dr. Donnie Aho this morning.  This demonstrated  severe, calcific stenosis of the left circumflex OM1 bifurcation with  disease extending to just beyond the ostium of the left circumflex.  He  did not have significant LAD disease.  After reviewing his films, we  elected to proceed with percutaneous intervention of his left circumflex  OM bifurcation.   There was an indwelling 5-French sheath in the right femoral artery.  This was upsized to a 7-French sheath because of the nature of the  lesion.  The patient had been preloaded with Plavix.  Angiomax was used  for anticoagulation.  A 7-French XB 3.5-cm guide catheter was inserted.  Initial angiography was performed.  A Cougar guidewire was passed into  the OM-1 branch with moderate amount of difficulty.  A second Cougar  wire was passed into the AV groove circumflex, which was a dominant  vessel.  I performed intravascular ultrasound of both vessels.  The IVUS  of the OM was performed first.  This demonstrated severe stenosis with  heavy calcification at the ostium of the OM branch.  The IVUS was pulled  back into the native left circumflex.  There was a keyhole lesion with  heavy calcification and significant stenosis.  The IVUS catheter was  then passed into the AV groove circumflex and IVUS was performed on that  vessel as well.  At that point, I elected to perform cutting balloon  angioplasty.  A 2.5 x 15-mm Flextome cutting balloon was advanced into  the proximal OM branch and inflated to 8 atmospheres.  It was difficult  to pass.  After the first inflation, I was able to advance it further  down into the OM branch and a second inflation to 8 atmospheres was  performed.  I attempted to pass the cutting balloon into the AV groove  circumflex to pretreat that portion of the bifurcation.  The cutting  balloon would not pass.  Therefore, I elected to stent the proximal  circumflex into the first OM branch.  A 2.5 x 18 mm Xience stent was  used.  The stent passed easily and then was carefully positioned and  then deployed at 14 atmospheres.  There was a residual waste in the  distal portion of the stent near the ostium of the OM branch.  The AV  groove circumflex remained patent.  I elected to  postdilate the stent  with a 2.75 x 12-mm Daphne Quantum apex balloon, which was taken to 16  atmospheres distally and 18 atmospheres in the proximal portion of the  stent.  Following postdilatation, there was an excellent result with  improvement in the proximal circumflex and OM.  There was TIMI III flow  in the AV groove circumflex with a mild amount of ostial narrowing.  Because the AV groove circumflex was dominant, I elected to redilate  that lesion.  The wire, which had been pulled just after stenting, was  re-advanced and passed into the AV groove circumflex with a moderate  amount of difficulty.  It was passed through the stent struts.  I was  able to pass a 2.5 x 15-mm apex balloon through the stent struts into  the AV groove circumflex.  The 2.75 x 12-mm [place] Quantum apex was re-  advanced into the OM.  Final kissing balloon angioplasty was performed  up to 8  atmospheres on both balloons.  Two inflations were performed so  that the bifurcation was well treated.  Final angiography demonstrated  an excellent angiographic result.  There was TIMI III flow in both  branches.  The OM was widely patent.  The AV groove circumflex remained  widely patent with only mild residual narrowing at the bifurcation.   At the completion of the procedure, the OM branch had 0% residual  stenosis.  At left side AV groove circumflex had 30% residual stone.   ASSESSMENT:  Successful percutaneous coronary intervention of the obtuse  marginal, left circumflex bifurcation as outlined above.   PLAN:  Recommend aspirin, Plavix for a minimum of 12 months, favor long-  term, if the patient tolerates.  Further management per Dr. Donnie Aho.      Veverly Fells. Excell Seltzer, MD  Electronically Signed     MDC/MEDQ  D:  02/11/2009  T:  02/12/2009  Job:  [id number]

## 2010-12-06 NOTE — Op Note (Signed)
[name], [name]             ACCOUNT NO.:  [id number]   MEDICAL RECORD NO.:  [id number]          PATIENT TYPE:  AMB   LOCATION:  DSC                          FACILITY:  [facility]   PHYSICIAN:  Cindee Salt, M.D.       DATE OF BIRTH:  [birth year]   DATE OF PROCEDURE:  11/29/2007  DATE OF DISCHARGE:                               OPERATIVE REPORT   PREOPERATIVE DIAGNOSIS:  Posterior interosseous nerve palsy, left arm.   POSTOPERATIVE DIAGNOSIS:  Posterior interosseous nerve palsy, left arm.   OPERATION:  Exploration release, posterior interosseous nerve, left arm.   SURGEON:  Cindee Salt, MD   ASSISTANT:  Joaquin Courts, RN   ANESTHESIA:  General.   ANESTHESIOLOGIST:  Angelina Ok.   HISTORY:  The patient is a 77 year old male with a history of a  posterior interosseous nerve palsy.  EMG nerve conductions are positive.  He is admitted for exploration.  He is aware that there is no guarantee  with surgery, possibility of infection, recurrence, injury to arteries,  nerves, tendons, incomplete relief of symptoms, dystrophy, and  possibility that this may require tendon transfers if there is no return  following this.  Questions have been encouraged and answered in the  preoperative area where the patient is seen.  The extremity was marked  by both the patient and surgeon.  Antibiotic was given.  Questions were  again encouraged and answered to his satisfaction.   PROCEDURE:  The patient was brought to the operating room where general  anesthetic was carried out without difficulty under Dr. Shon Hough  direction.  He was prepped using DuraPrep, supine position, left arm  free.  The limb was exsanguinated with an Esmarch bandage.  Tourniquet  placed high on the arm was inflated to 150 mmHg.  A curvilinear incision  was made over the anterior aspect of the elbow at the level of the  mobile wad of Sherilyn Cooter carried across the elbow.  This was carried down  through the subcutaneous tissue.  Bleeders  were electrocauterized.  The  dissection was carried down to the medial side of the mobile wad.  Vessels were coagulated.  They were tied with 4-0 Vicryl sutures.  Dissection was carried down to the radial nerve, which was identified.  The posterior interosseous nerve branch was followed.  The vascular  lesions were released, coagulated, and tied.  A very significant  compression at the arcade of [place] was identified.  This was released  as was the supinator.  No further lesions were identified.  Nerve  stimulator showed no significant change following the release.  The  wound was copiously irrigated with saline.  The subcutaneous tissue was  closed with interrupted 4-0 Vicryl sutures and the skin with interrupted  4-0 Vicryl Rapide sutures.  A sterile compressive dressing was applied.  The patient tolerated the  procedure well.  Tourniquet was deflated.  All fingers immediately  pinked.  He was taken to the recovery room for observation in  satisfactory condition.  He will be discharged home and to return to  [facility] in [place] in 1 week on  Vicodin.           ______________________________  Cindee Salt, M.D.     GK/MEDQ  D:  11/29/2007  T:  11/30/2007  Job:  [id number]

## 2010-12-09 NOTE — Procedures (Signed)
[name], [name]             ACCOUNT NO.:  [id number]   MEDICAL RECORD NO.:  [id number]          PATIENT TYPE:  OUT   LOCATION:  SLEEP CENTER                 FACILITY:  [facility]   PHYSICIAN:  Clinton D. Maple Hudson, M.D. DATE OF BIRTH:  12/14/1932   DATE OF STUDY:  01/03/2005                              NOCTURNAL POLYSOMNOGRAM   REFERRING PHYSICIAN:  Leslee Home, MD   INDICATION FOR STUDY:  Hypersomnia with sleep apnea.  Epworth sleepiness  score 14/24, BMI 32.8, weight 230 pounds.   SLEEP ARCHITECTURE:  Total sleep time 298 minutes with sleep efficiency 76%.  Stage I was 14%, stage II 72%, stages III and IV 12%, REM 1% of total sleep  time.  Sleep latency 30 minutes, REM latency 168 minutes, awake after sleep  onset 69 minutes, arousal index 73, which was increased.  Bedtime  medications were described as nerve pill, depression pill, arthritis pill  and cholesterol pill..   RESPIRATORY DATA:  Respiratory disturbance index (RDI AHI) 9.7 obstructive  events per hour, indicating mild obstructive sleep apnea/hypopnea syndrome.  There were 19 obstructive apneas, 1 mixed apnea and 28 hypopneas.  Events  were not positional, although most events with most sleep were recorded  supine.  REM RDI 19.  There were insufficient events to prevent use of split  protocol titration on study night.   OXYGEN DATA:  Very loud snoring with oxygen desaturation to a nadir of 81%  with events.  Mean oxygen saturation through the study was 94% on room air.   CARDIAC DATA:  Sinus rhythm with PACs and PVCs.   MOVEMENT/PARASOMNIA:  A total of 636 limb jerks were recorded, of which 272  were associated with arousal or awakening for a periodic limb movement with  arousal index of 54.8 per hour, which is severely abnormal.   IMPRESSION RECOMMENDATION:  1.  Mild obstructive sleep apnea/hypopnea syndrome, respiratory disturbance      index 9.7 per hour, with loud snoring and oxygen desaturation to 81%.      If  this is considered a significant cause of sleep disturbance or      secondary cardiovascular issues, he would qualify to return for CPAP      titration, otherwise consider alternative therapies as appropriate.  2.  Severe periodic limb movement with arousal, 54.8 per hour.  Consider      treating with clonazepam or Requip if appropriate.      Clinton D. Maple Hudson, M.D.  Diplomat    CDY/MEDQ  D:  01/08/2005 11:07:18  T:  01/09/2005 15:51:41  Job:  [id number]

## 2010-12-09 NOTE — Discharge Summary (Signed)
[place]  Patient:    [name], [name] Visit Number: [id number] MRN: [id number]          Service Type: MED Location: [phone] Attending Physician:  Roque Lias Dictated by:   Reuben Likes, M.D. Admit Date:  09/09/2001 Discharge Date: 09/11/2001                             Discharge Summary  HISTORY OF PRESENT ILLNESS:  [name] is a 77 year old male who experienced onset of sharp, aching, left pectoral chest pain with radiation to the left shoulder blade, but now down the arm on the day of admission at 2:30 p.m. while he was cleaning his garage.  He states he was not doing anything particularly strenuous at the time.  The pain lasted until after he arrived at the emergency room 1-1/2 hours total.  He did not have any dyspnea or nausea, but did have some diaphoresis.  The pain was not associated with exertion, activity, or meals.  It was not pleuritic.  There was no cough or hemoptysis. The patient denies any exertional chest pain in the recent past.  He states he had similar pain about 2 to 3 weeks ago which lasted 30 to 45 minutes.  He has no known cardiac history.  He had a normal stress Cardiolite scan in 1999, at which time he saw Dr. Viann Fish.  By the time I saw him he was pain-free and in no distress and comfortable.  PHYSICAL EXAMINATION:  VITAL SIGNS:  Blood pressure 153/68, pulse 90 and regular, respiratory rate 20, temperature 97.3, and O2 saturation 97% on room air.  GENERAL:  He appeared alert and was in no distress.  SKIN:  Warm and dry.  HEENT:  Pupils are equal, round and reactive to light.  Fundi were benign.  He had bilateral cataract extractions.  ENT was unremarkable.  NECK:  There was no cervical adenopathy, JVD, or bruit.  LUNGS:  Clear.  CARDIAC:  Rhythm was regular without murmurs, rubs, or gallops.  There was no chest wall tenderness.  ABDOMEN:  Negative.  He had no edema.   Pulses were full.  EXTREMITIES:  There was no calf tenderness, and Homans sign was negative.  NEUROLOGIC:  Within normal limits.  ADMITTING IMPRESSION: 1. Chest pain, possibly due to myocardial infarction, angina, pulmonary    embolus, or musculoskeletal pain. 2. Hypertension. 3. Mild hyperlipidemia. 4. Mildly abnormal glucose tolerance. 5. Gout. 6. Osteoarthritis.  LABORATORY DATA:  EKG showed normal sinus rhythm with nonspecific T-wave changes.  He had an inverted T-wave in AVL, but otherwise his EKG was normal. Chest x-ray showed no active disease.  CT of the chest showed no evidence for PE.  There was no evidence of DVT in the lower extremities.  His hemoglobin was 14.8, white count 7200.  Sodium 138, potassium 4.5, glucose 96, BUN 19, creatinine 1.9.  CPK levels were 166, 205, and 199.  MB levels were 3.2, 16.2, and 12.5.  Troponin levels were 0.2, 2.05, and 1.49.  His lipid profile showed a cholesterol of 232, triglycerides of 310, HDL 42, LDL 128.  HOSPITAL COURSE:  He was admitted to a monitored bed.  Vital signs were obtained q.2h. x12 hours, then q.4h.  He was put at bedrest with bedside commode.  Given IV D5 1/4 normal saline at 75 cc an hour, a 2 g sodium fat modified diet, and  oxygen at 2 L per minute by nasal cannula.  He was continued on Micardis 80 mg q.d., diclofenac 75 mg b.i.d., probenecid/colchicine one q.d., hydrochlorothiazide 25 mg q.d., Norvasc 10 mg q.d., aspirin 325 mg q.d., nitroglycerin sublingual p.r.n.  He was seen in consultation by Dr. Verdis Prime.  By the following morning, he had no chest pain.  He underwent a cardiac catheterization on 09/10/01, which showed a 50 to 60% stenosis in the circumflex.  His left ventricle was normal.  His left anterior descending artery showed a 30 to 40% stenosis.  It was felt by Dr. Donnie Aho who was his cardiologist that he had a spontaneous plaque rupture, as likely cause of acute coronary syndrome.  He tolerated  his cath well.  Post-cath he was continued on Lovenox, was begun on Zocor 40 mg q.h.s., Lopressor 25 mg b.i.d., and he was continued on Plavix and aspirin as well.  As of 09/11/01, he had no chest pain since admission, he was up and ambulating in the hallway.  He wanted to go home.  His cardiologist felt it was safe to send him home.  So he was discharged on that date.  His lungs were clear.  His cardiac rhythm was regular without murmur.  His abdomen was soft and nontender without organomegaly or mass.  FINAL DIAGNOSES: 1. Acute myocardial infarction. 2. Ischemic heart disease. 3. Hypertension. 4. Hyperlipidemia. 5. Obesity.  DISCHARGE MEDICATIONS: 1. Altace 10 mg q.d. 2. Toprol XL 50 mg q.d. 3. Plavix 75 mg q.d. 4. Aspirin 81 mg q.d. 5. Zocor 20 mg q.h.s. 6. Probenecid/colchicine q.d. 7. Diclofenac 75 mg b.i.d. 8. Nitroglycerin p.r.n.  He is to stop his Micardis and hydrochlorothiazide.  ACTIVITY:  No heavy work for the next two weeks.  No driving for two weeks.  FOLLOWUP:  Cardiac rehab.  DIET:  Low fat, low cholesterol, no added salt.  FOLLOWUP: 1. He is to see me in two weeks. 2. Dr. Donnie Aho in several weeks as well. Dictated by:   Reuben Likes, M.D. Attending Physician:  Roque Lias DD:  09/21/01 TD:  09/23/01 Job: [id number]

## 2010-12-09 NOTE — Consult Note (Signed)
[place]  Patient:    [name], [name] Visit Number: [id number] MRN: [id number]          Service Type: MED Location: [phone] Attending Physician:  Cathren Laine Dictated by:   Darci Needle, M.D. Proc. Date: 09/09/01 Admit Date:  09/09/2001   CC:         Reuben Likes, M.D.  Darden Palmer., M.D.   Consultation Report  CONCLUSION: 1. Chest pain consistent with unstable angina.    a. Prior negative stress Cardiolite approximately four or five years ago. 2. Hypertension. 3. Hyperlipidemia. 4. Glucose intolerance.  RECOMMENDATIONS: 1. Serial enzymes to rule out myocardial infarction. 2. Aspirin. 3. Lovenox or heparin until infarction rule out. 4. Sublingual nitroglycerin p.r.n. for current chest pain, IV if recurrent    chest pain. 5. Catheterization or stress Cardiolite per Dr. Tawana Scale discretion. 6. Serial EKGs.  COMMENTS:  The patient is 77 years of age and was admitted this evening with a prolonged episode of left precordial chest discomfort that radiates through to the back.  The discomfort does not radiate into the arm, up into the neck.  It is associated with dyspnea.  There are no palpitations.  No syncope.  The patient had a similar episode of discomfort one week ago.  The episode today started 2:30 and ended at 4:30 spontaneously.  It was not precipitated by heavy physical activity.  HABITS:  Does not smoke or drink.  SIGNIFICANT RISK FACTORS: 1. Hypercholesterolemia. 2. Hypertension. 3. Obesity. 4. Possibly family history (father died of congestive heart failure at age    81).  ALLERGIES:  None.  PHYSICAL EXAMINATION:  GENERAL:  The patient is in no distress.  He is lying comfortably in bed.  VITAL SIGNS:  Blood pressure 140/70, heart rate 70.  SKIN:  Warm and dry.  HEENT:  Unremarkable.  Pupils are equal, round, and reactive to light.  NECK:  No JVD or carotid bruits.  LUNGS:  Clear to  auscultation and percussion.  CARDIAC:  No clicks or rubs.  There is a 1/6 systolic murmur.  No diastolic murmur.  ABDOMEN:  Soft.  Liver and spleen are not palpable.  Bowel sounds are normal.  EXTREMITIES:  No edema.  Pulses are 2+ and symmetric in upper and lower extremities.  NEUROLOGIC:  Normal.  LABORATORY DATA:   EKG reveals normal sinus rhythm, nonspecific ST-T abnormalities.  CK-MB 156/3.2, troponin I 0.02. Dictated by:   Darci Needle, M.D. Attending Physician:  Cathren Laine DD:  09/09/01 TD:  09/10/01 Job: [id number]

## 2010-12-09 NOTE — Consult Note (Signed)
NAMETAGE, FEGGINS NO.:  [id number]   MEDICAL RECORD NO.:  [id number]          PATIENT TYPE:  INP   LOCATION:  1843                         FACILITY:  [facility]   PHYSICIAN:  Kerin Perna, M.D.  DATE OF BIRTH:  1932-08-05   DATE OF CONSULTATION:  05/28/2006  DATE OF DISCHARGE:                                   CONSULTATION   PRIMARY CARE PHYSICIAN:  Maryjane Hurter, M.D.   REASON FOR CONSULTATION:  Pericardial effusion.   CHIEF COMPLAINT:  Shortness of breath.   HISTORY OF PRESENT ILLNESS:  I was asked to evaluate this 77 year old white  male for potential pericardial window for recently diagnosed moderate to  large pericardial effusion found by 2D echocardiogram today.  The patient  has had approximately one week of progressive shortness of breath, weakness,  decreased exercise tolerance, and left shoulder pain.  He has had some  orthopnea.  He was in the emergency department three days ago for evaluation  after a fall doing yard work, and a head CT scan and rib x-rays were  negative.  Chest x-ray showed mild cardiomegaly that was consistent with a  history of hypertension.  He returned with shortness of breath, and a 12-  lead EKG showed diffuse ST segment elevation consistent with pericarditis.  His cardiac enzymes were negative, and he underwent a 2D echocardiogram.  This was interpreted this evening and found to be consistent with  pericarditis, with a moderate to large pericardial effusion, and some  compression of the right atrium and right ventricle, but no phasic changes  in transmitral flow with respiration.  Associated with this pericardial  effusion was a BUN of 70, and a creatinine of 3.3, which is fairly new, and  an anemia with hemoglobin of 10.  Because of the moderate to large effusion,  it was felt that pericardial window would be indicated both for diagnostic  and therapeutic purposes.   PAST MEDICAL HISTORY:  1. Hypertension.  2. Chronic  renal insufficiency.  3. Status post left carotid endarterectomy in 2005 by Dr. Arbie Cookey.  4. History of moderate coronary disease of the LAD and circumflex systems      by cardiac catheterization in 2003 by Dr. Donnie Aho.  5. Degenerative arthritis and gout.  6. NO KNOWN DRUG ALLERGIES.  7. Restless leg syndrome.   MEDICATIONS:  1. Niaspan 500 daily.  2. Oxybutynin 5 mg daily.  3. Vicodin p.r.n.  4. Lopressor 50 b.i.d.  5. Levaquin 750 every other day.  6. Lisinopril 20 mg daily.  7. Aspirin 1 daily.  8. Requip 4 mg at bedtime.  9. Lorazepam 2 mg b.i.d.  10.Mirtazapine 15 mg daily.   ALLERGIES:  None.   SOCIAL HISTORY:  The patient is married and is retired from the [employer].  He does not smoke or use alcohol.   FAMILY HISTORY:  Positive for heart failure, coronary artery disease, and  pancreatic cancer and breast cancer.   REVIEW OF SYSTEMS:  Generalized weakness and decreased exercise tolerance  over the past several days.  He has also had associated loss of appetite,  constipation, sore throat, and some difficulty voiding.  He has had no  syncope, significant chest pain, or fever.  He has had no symptoms of  stroke.  His head scan showed no abnormalities.  His C-spine x-rays did show  some arthritis.   PHYSICAL EXAMINATION:  VITAL SIGNS:  He is 5 feet 10 inches, and weighs 230  pounds, blood pressure 130/70, pulse 88, respirations 20, temperature 96,  saturation 95% on 2 liters.  GENERAL APPEARANCE:  A somewhat disheveled elderly male in the emergency  department, accompanied by his daughter, in no acute distress, sitting at 30  degrees, breathing comfortably with 2 liters nasal cannula.  HEENT:  A slight bruise over the left upper lip from a fall associated with  cutting trees and yard work a week ago.  No other bruising is apparent.  NECK:  He has no JVD.  He has a well-healed left carotid incision.  LUNGS:  Breath sounds are distant, with scattered rhonchi.   CARDIAC:  Reveals a grade 2/6 murmur over the left upper sternal border.  No  evidence of pericardial friction rub.  ABDOMEN:  Obese, slightly distended, without organomegaly palpable.  EXTREMITIES:  Reveal excellent pulses in the extremities.  No cyanosis or  ecchymoses.  NEUROLOGIC:  Intact.   LABORATORY DATA:  I reviewed the 2D echocardiogram.  He has a moderate to  large 2 cm effusion.  LV function appears to be intact.  He has evidence of  aortic sclerosis, insignificant mitral regurgitation, or tricuspid  regurgitation.  There are no phasic changes in transmitral filling with  respiration.  His BUN is 71, creatinine 3.3, hematocrit 31, white count  9000.  BNP 428, creatinine clearance 23.  LFTs normal, albumin 2.9.   IMPRESSION AND PLAN:  The patient has pericarditis, probably uremic in  etiology, and would benefit from a subxiphoid pericardial window.  We will  proceed with this in the morning.  Prior to operation, he will undergo a CT  scan without contrast to evaluate his aorta, since he did have a fall within  the past week.  I discussed the procedure of subxiphoid pericardial window  in detail with the patient and family, including the alternatives to  surgery, the details of surgery, the expected recovery, and the associated  risks.  The patient understands and agrees to proceed.   Thank you for the consultation.      Kerin Perna, M.D.  Electronically Signed    PV/MEDQ  D:  05/28/2006  T:  05/28/2006  Job:  [id number]   cc:   [facility] Office  W. Viann Fish, M.D.  Maryjane Hurter, M.D.

## 2010-12-09 NOTE — Consult Note (Signed)
[name], [name]             ACCOUNT NO.:  [id number]   MEDICAL RECORD NO.:  [id number]          PATIENT TYPE:  INP   LOCATION:  1843                         FACILITY:  [facility]   PHYSICIAN:  Georga Hacking, M.D.DATE OF BIRTH:  02/06/1933   DATE OF CONSULTATION:  DATE OF DISCHARGE:                                 CONSULTATION   REASON FOR CONSULTATION:  Chest pain, dyspnea, possible congestive heart  failure, pericardial tamponade.  This 77 year old male was seen at the  request of Dr. Darnelle Catalan for evaluation of dyspnea and chest discomfort.  The  patient has a history of hypertension, obesity, hyperlipidemia, and  coronary artery disease.  He had small elevation of cardiac enzymes in  2003 and catheterization at that time showed a moderate stenosis of the  circumflex coronary artery which was dominant.  He has been in his usual  state of health and was cutting down trees about 1 week ago. Evidently a  tree limb fell and hit him across the face and chest and knocked him to  the ground.  He felt somewhat stunned and had some shortness of breath  beginning a day or two after that.  He saw Dr. Lorenz Coaster in the office on  05/22/06 and again on 05/25/06.  Laboratory work done on the second  showed creatinine of 3.2 which had previously been stable back in August  on his medical regimen.  He was complaining of increasing dyspnea and  also had chest pain which was somewhat pleuritic which is worse when he  lays down and better when he stood up.  He also had a CT scan of the  head several rib series and chest x-ray that showed some cardiomegaly.  He presented to the emergency room where he was found to be in renal  failure and was anemic and also was anemia on Friday.  He had an  elevated D-dimer and an EKG showing diffuse ST segment elevation.  Dr.  Darnelle Catalan has admitted the patient and I was asked to consult on him.  He  evidently had a CT scan of the head previously that was unremarkable  except for a prominent ventricle.   His past history is remarkable for hypertension, hyperlipidemia,  obesity, a history of reflux, a history of severe restless leg syndrome  and some recent onset of renal failure.   PREVIOUS SURGICAL HISTORY:  He has had a left carotid endarterectomy  done previously. He has also had shoulder surgery, cataract surgery.   ALLERGIES:  None.   MEDICATIONS:  Prior to admission were:  1. Metoprolol 50 b.i.d.  2. Requip .4 mg q.h.s.  3. Metrazine 50 mg q.h.s.  4. Doxazosin 2 mg q.h.s.  5. Niaspan 500 mg.  6. Oxybutynin.  7. He also had been placed on Levaquin for a possible infection      earlier in the week.  8. Lisinopril also had previously been discontinued.   FAMILY HISTORY:  Father died of heart failure at age 38. Mother died at  age 93 of heart disease.  Brother died of pancreatic cancer.  Sister  died of breast  cancer.  Sister died of COPD.   SOCIAL HISTORY:  He is married and has an invalid wife that lives at  home.  He has never smoked.  He previously worked in [employer].  Denies  significant alcohol abuse.   REVIEW OF SYSTEMS:  He has been obese for many years.  He has  significant restless leg syndrome.  He has no glaucoma, cataracts, or  macular  degeneration at this time although he does have a previous  history  of cataract surgery. He does have a history of hyperlipidemia.  He has a history of TIA's previously and had a carotid endarterectomy on  the left several years ago.  He has noted increasing shortness of breath  recently but denies recent fever or cough.  He has significant  difficulty with nocturia and frequency as well as hesitancy. He has a  history of reflux associated symptoms.  He has severe restless legs and  arthritis.  He has a previous history of pharyngitis.  He has had  significant weakness. He has had some dysuria and has been markedly  unsteady and has been somewhat hypoxic.  Evidence noted by the remainder   of the review of systems is unremarkable.   PHYSICAL EXAMINATION:  VITALS:  He is an obese dyspneic male.  His blood  pressure is currently 125/80 with a 15 ml pulsus paradox.  Pulse was  currently 85 and regular.  SKIN:  Warm and dry.  No obvious signs of trauma except for scab on the  lip.  HEENT:  EOMI, PERLA, C AND S clear, pharynx negative. There is a healed  left carotid endarterectomy scar, jugular venous pressure is elevated.  LUNGS:  Clear.  CARDIAC EXAM:  Showed somewhat distant heart sounds.  Faint 1-2/6  systolic murmur.  ABDOMEN:  Somewhat distended intense.  Distal pulses were 2+.  There is  a bony protuberance on the dorsal aspect of the left foot.  NEUROLOGIC:  Showed him to be alert and oriented.   LABORATORY DATA:  Showed a white count of 9000, hemoglobin of 10.8,  hematocrit 31.9.  Platelet count of 247,000.  His BUN was 71 and  creatinine was 3.2.  Urine output is fair.  There is also elevation of  liver enzymes.  D-dimer was fixed.  B natriuretic peptide level was 638.  Echocardiogram reviewed showed the presence of pericardial tamponade  with a 2 cm circumference and effusion.  Some questionable stranding  across the anterior aspect of the right ventricle.  He has also right  atrial collapse and respiraphasic changes that are absent.   Chest x-ray shows fluid in the right fissure and somewhat moderate  cardiomegaly relatively clear lung fields.   IMPRESSION:  1. Pericardial tamponade that may be causing all of his symptoms. By      history this could have been traumatic induced by the injury he      sustained one week ago.  Other possibilities would include      malignancy, infectious or rheumatologic.  2. Acute renal failure on top of very mild renal insufficiency      previously.  3. Coronary artery disease with previous moderate coronary artery      disease.  4. Peripheral vascular disease with previous left carotid     endarterectomy and previous TIA.   5. Hypertension.  6. Anemia.  7. Elevated D-dimer, may be due to paracardial effusion.   RECOMMENDATIONS:  I would withhold anticoagulation at this time.  I  would increase IV fluids and I would ask for a cardiac surgery  consultation for pericardial wound and drainage with pericardial  effusion at this time particularly since it may be traumatic.  May  obtain a non contrast CT scan of the chest, follow renal failure  carefully.      Georga Hacking, M.D.  Electronically Signed     WST/MEDQ  D:  05/28/2006  T:  05/28/2006  Job:  [id number]   cc:   Reuben Likes, M.D.  Hillery Aldo, M.D.  Kerin Perna, M.D.
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
## 2010-12-09 NOTE — Discharge Summary (Signed)
NAMEFRANZ, SVEC             ACCOUNT NO.:  [id number]   MEDICAL RECORD NO.:  [id number]          PATIENT TYPE:  INP   LOCATION:  3306                         FACILITY:  [facility]   PHYSICIAN:  Larina Earthly, M.D.    DATE OF BIRTH:  06-05-1933   DATE OF ADMISSION:  12/06/2004  DATE OF DISCHARGE:                                 DISCHARGE SUMMARY   ADMITTING DIAGNOSIS:  Symptomatic left internal carotid artery stenosis.   PAST MEDICAL HISTORY AND DISCHARGE DIAGNOSES:  1.  Hypertension.  2.  Status post myocardial infarction, 2004, with negative cardiac      catheterization since that time.  3.  Left internal carotid artery stenosis, status post transient ischemic      attack, with an episode of right hand weakness and clumsiness that      resolved.  However, the right hand clumsiness continues to improve,      status post left carotid endarterectomy.   ALLERGIES:  No known drug allergies.   BRIEF HISTORY:  The patient is a [name] who presented  two weeks prior to admission with an episode of right hand weakness and  clumsiness as well as a staggering gait and difficulty with speech.  This  improved, but he still has a mild deficit with right hand clumsiness  present.  He underwent workup including MRI and MRA as well as a CT scan of  the head.  The CT scan revealed no evidence of acute infarct.  However, he  did have severe 85% internal carotid artery stenosis by MRA on the left.  The patient had no prior neurologic events prior to this event.  The patient  was then subsequently referred to Dr. Tawanna Cooler Early regarding carotid  endarterectomy.   HOSPITAL COURSE:  The patient was admitted and taken to the O.R. on Dec 06, 2004 for a left carotid endarterectomy with Dacron patch angioplasty.  The  patient tolerated the procedure well and was hemodynamically stable  immediately postoperatively.  The patient was transferred from the O.R. to  the postanesthesia care  unit in stable condition.  The patient was extubated  without complication and woke up from anesthesia neurologically intact.   The patient's postoperative course progressed as expected.  He remained  stable overnight and this morning.  On postoperative day one, he is without  complaint. The patient is ambulating well and tolerating his diet.  He is  afebrile, with stable vital signs, and maintaining a normal sinus rhythm.  On physical exam, cardiac was regular rate and rhythm, with a 2/6 systolic  ejection murmur.  Lungs were clear to auscultation bilaterally.  The abdomen  was benign.  The incisions was clean, dry, and intact.  There was no  evidence of hematoma, and there was only mild edema present.  Neurologically, the patient was grossly intact.  The patient's Foley  catheter has been removed.  However, he has not voided yet.  As long as the  patient is able to void and tolerate his diet this morning, he will be ready  for discharge this morning or this afternoon.  LABORATORIES:  CBC and BMP on Dec 07, 2004:  White count 7.6, hemoglobin 12,  hematocrit 35, platelets 151.  Sodium 140, potassium 4.3, BUN 14, creatinine  1.2, glucose 100.   CONDITION ON DISCHARGE:  Improved.   INSTRUCTIONS:   MEDICATIONS:  1.  Toprol XL 50 mg q.a.m.  2.  Temazepam 15 mg at bedtime p.r.n.  3.  Norvasc 5 mg q.a.m.  4.  Diclofenac 50 mg b.i.d.  5.  Lorazepam 2 mg b.i.d.  6.  Lisinopril 40 mg q.a.m.  7.  Flomax 0.4 mg q.a.m.  8.  Vytorin 10/20 mg at bedtime.  9.  Plavix 75 mg q.a.m.  10. Aspirin 81 mg p.o. q.a.m.  11. Tylox 1-2 q.4-6 h. p.r.n. pain.   ACTIVITY:  No driving, and no lifting of more than 10 pounds.  The patient  should continue daily breathing and walking exercises.   DIET:  Low salt, lowfat.   WOUND CARE:  The patient may shower beginning Dec 08, 2004.  He should clean  his incision daily with soap and water.  If wound problems arise, he should  contact the CVTS office at  [phone].   FOLLOW-UP APPOINTMENT:  With Dr. Arbie Cookey on Thursday December 29, 2004 at 10:50  a.m.      AY/MEDQ  D:  12/07/2004  T:  12/07/2004  Job:  [id number]   cc:   Larina Earthly, M.D.  [address]  [place]  [place] [address]

## 2010-12-09 NOTE — Cardiovascular Report (Signed)
[place]. [facility]  Patient:    [name], [name] Visit Number: [id number] MRN: [id number]          Service Type: MED Location: [phone] Attending Physician:  Roque Lias Dictated by:   Darden Palmer., M.D. Proc. Date: 09/10/01 Admit Date:  09/09/2001   CC:         Reuben Likes, M.D.  Cardiac Catheterization Lab   Cardiac Catheterization  PROCEDURE:  Cardiac catheterization.  HISTORY:  A 77 year old male with hypertension and marked hyperlipidemia who had an isolated episode of chest discomfort which was prolonged and associated with elevation of cardiac enzymes.  CARDIOLOGIST:  Darden Palmer., M.D.  COMMENTS ABOUT PROCEDURE:  The patient tolerated the procedure well without complications.  At the end of the procedure, good hemostasis and peripheral pulses were noted.  HEMODYNAMIC DATA:  Aorta post contrast 136/67, LV post contrast 136/10 to 12.  ANGIOGRAPHIC DATA:  Left ventriculogram:  Performed in the 30 degree RAO projection.  The aortic valve was normal.  The mitral valve was normal.  The left ventricle appears normal in size.  The estimated ejection fraction is 60%.  Coronary arteries arise and distribute normally.  Calcification is noted involving the mitral anulus as well as the proximal left coronary system.  The left main coronary artery appears normal.  The left anterior descending is calcified proximally with a 20 to 30% segmental stenosis.  There is an eccentric hazy stenosis in the mid portion of the vessel estimated at around 40% involving the septal perforator.  There are mild irregularities distally. A small first diagonal branch has a 70% narrowing at its origin.  The circumflex is a codominant vessel and supplies a large first marginal branch which contains mild irregularities.  There is an eccentric 50 to 60% stenosis in a bend of the circumflex just after the large acute  marginal branch.  While difficult to lay out angiographically, it appears to be narrowed approximately 50 to 60% and did not have any severe focal narrowings noted within it.  The vessel ends in a posterior descending artery.  The right coronary artery is a congenitally small vessel which is codominant and ends in a single posterior descending artery which is small.  IMPRESSION: 1. Moderate coronary artery disease predominantly involving the left anterior    descending artery and a codominant circumflex coronary artery after a    marginal branch. 2. Normal left ventricular function.  RECOMMENDATIONS:  The stenosis is not ideal for intervention percutaneously and was of borderline significance.  It is recommended that the patient be treated medically with Plavix, beta blocker, calcium channel blockers, ACE inhibitors, and that he try to be stabilized initially.  Medically, consider functional study down the road. Dictated by:   Darden Palmer., M.D. Attending Physician:  Roque Lias DD:  09/10/01 TD:  09/10/01 Job: [id number]

## 2010-12-09 NOTE — Op Note (Signed)
[name], [name] NO.:  [id number]   MEDICAL RECORD NO.:  [id number]          PATIENT TYPE:  INP   LOCATION:  2914                         FACILITY:  [facility]   PHYSICIAN:  Kerin Perna, M.D.  DATE OF BIRTH:  06-Jul-1933   DATE OF PROCEDURE:  05/29/2006  DATE OF DISCHARGE:                                 OPERATIVE REPORT   OPERATION:  Subxiphoid pericardial window.   SURGEON:  Kerin Perna, M.D.   ASSISTANT:  Pecola Leisure, PA   PREOPERATIVE DIAGNOSIS:  Acute pericarditis with pericardial effusion.   POSTOPERATIVE DIAGNOSIS:  Acute pericarditis with pericardial effusion.   INDICATIONS:  The patient is a 77 year old male with hypertension and recent  a onset of progressive shortness of breath.  He was found to have a rather  acute onset of renal failure with a BUN of 70, creatinine of 3.3.  He was  found to have a moderate pericardial effusion with preserved LV systolic  function on 2-D echo.  He was felt to be candidate for a subxiphoid  pericardial window.   Prior to surgery I examined the patient in the emergency department at the  time of his admission.  I reviewed the results of his studies and discussed  the indications and benefits of subxiphoid pericardial window.  We discussed  the major aspects of the procedure including the use of general anesthesia,  the location of the incision, the use of a postoperative drainage tube, and  the expected postoperative recovery.  We discussed the potential  complications including arrhythmia, bleeding, infection, MI, ventilator  dependence, and death.  He understood these implications and agreed to  proceed with surgery.   PROCEDURE:  The patient was brought to the operating room and placed supine  on the operating table.  A Swan-Ganz catheter had been placed preoperatively  by the anesthesiologist, which showed moderately elevated pulmonary  pressures of 58/27.  The patient's chest and abdomen were  prepped and draped  as a sterile field.  An incision was made centered over the xiphoid.  The  soft tissue was divided and the xiphoid was removed.  The Rultract retractor  was used to elevate the distal sternum.  The soft tissue anterior to the  pericardium was dissected away and the pericardium was exposed.  A 15 blade  was used to make an incision in the anterior pericardium.  Immediately clear  fluid drained from the incision under pressure, 400 mL of fluid was removed.  A large 3 x 4 cm incision and opening was made in the anterior pericardium  and the tissue was examined.  It was thickened and inflamed.  The surface of  the heart had a fibrinous exudate and the epicardium was thickened and  inflamed.  There was no blood.  There were no tumor nodules.  The  pericardial space was gently irrigated with warm saline.  After all the  fluid had been removed, a 28-French Silastic drainage tube was placed and  brought out through a separate incision.  The retractor  was then removed.  The incision was closed in layers using #  1 Vicryl  interrupted sutures for the fascia.  The subcutaneous and skin were closed  with a running Vicryl and sterile dressings were applied.  The patient was  reversed from anesthesia, extubated, and returned to the recovery room in  stable condition.      Kerin Perna, M.D.  Electronically Signed     PV/MEDQ  D:  05/29/2006  T:  05/29/2006  Job:  [id number]   cc:   Georga Hacking, M.D.  Maryjane Hurter, M.D.  CVTS Office

## 2010-12-09 NOTE — Op Note (Signed)
NAMEMIKAELE, STECHER             ACCOUNT NO.:  [id number]   MEDICAL RECORD NO.:  [id number]          PATIENT TYPE:  INP   LOCATION:  2899                         FACILITY:  [facility]   PHYSICIAN:  Larina Earthly, M.D.    DATE OF BIRTH:  1933/05/24   DATE OF PROCEDURE:  12/06/2004  DATE OF DISCHARGE:                                 OPERATIVE REPORT   PREOPERATIVE DIAGNOSIS:  Severe symptomatic left internal carotid artery  stenosis.   POSTOPERATIVE DIAGNOSIS:  Severe symptomatic left internal carotid artery  stenosis.   PROCEDURE:  Left carotid endarterectomy and Dacron patch angioplasty.   SURGEON:  Gretta Began, M.D.   ASSISTANT:  1.  Cari Caraway, M.D.  2.  Kristen Dominick, P.A.C.   ANESTHESIA:  General endotracheal.   COMPLICATIONS:  None.   DISPOSITION:  To recovery room stable.   PROCEDURE IN DETAIL:  The patient was taken to the operating room and placed  in the supine position where the area of the left neck was prepped and  draped in the usual sterile fashion.  Incisions were made over the  sternocleidomastoid and carried down through the platysma with  electrocautery.  The sternocleidomastoid reflected posteriorly and the  carotid sheath was opened.  The facial vein was ligated with 2-0 silk ties  and divided.  The common carotid artery was encircled with an umbilical tape  and Rumel tourniquet.  The hypoglossal and vagus nerves were identified and  preserved.  Dissection was extended on to the bifurcation.  The superior  thyroid artery was circled with a 2-0 silk Potts tie.  The external carotid  was circled with a blue vessel loop.  The internal carotid was circled with  an umbilical tape and Rumel tourniquet.  The patient was given 8000 units of  intravenous heparin and after an adequate circulation time the internal and  external common carotid arteries were occluded.  The common carotid artery  was opened with an 11 blade and centered longitudinally with Potts  scissors  onto the internal carotid artery.  A 10 shunt was passed up the internal  carotid, allowed to backbleed and then down the common carotid where it was  secured with Rumel tourniquets.  The endarterectomy was begun on the common  carotid artery and the plaque was divided proximally with Potts scissors.  The endarterectomy extended on to the bifurcation.  The external carotid was  endarterectomized with an eversion technique and the internal carotid was  endarterectomized in an open fashion.  Remaining atheromatous debris was  removed from the endarterectomy plane.  __________ Hemashield Dacron patch  was brought into the field and was sewn as a patch angioplasty with a  running 6-0 Prolene suture.  Prior to completion of the anastomosis, the  usual flushing maneuvers were undertaken.  After the shunt was removed, the  anastomosis was completed.  The clamps were removed from the external  followed by the common and finally the internal carotid artery occluding  clamp.  The patient had excellent __________ with handheld Doppler in the  internal and external carotid arteries.  The patient was given  50 mg of  protamine to reverse the heparin.  The wounds were irrigated with saline,  hemostasis with electrocautery.  The wounds were closed with  several 3-0 Vicryl sutures to reapproximate sternocleidomastoid area with a  carotid sheath.  Next, the platysma was closed with a running 3-0 Vicryl  suture and finally the skin was closed with a 4-0 subcuticular Vicryl  stitch.  A sterile dressing was applied and the patient was taken to the  recovery room in stable condition.      TFE/MEDQ  D:  12/06/2004  T:  12/06/2004  Job:  [id number]   cc:   Reuben Likes, M.D.  [address]  [place]  [place] [address]  Fax: [phone]

## 2010-12-09 NOTE — H&P (Signed)
[facility]  Patient:    [name], [name] Visit Number: [id number] MRN: [id number]          Service Type: MED Location: 1800 1830 01 Attending Physician:  Cathren Laine Dictated by:   Reuben Likes, M.D. Admit Date:  09/09/2001                           History and Physical  CHIEF COMPLAINT:  "Chest pain."  HISTORY OF PRESENT ILLNESS:  Mr. Blasco is a 77 year old male who experienced onset of a sharp, aching left pectoral chest pain with radiation to the left shoulder blade but not down the arm today at about 2:30 p.m. while he was cleaning up his garage.  He states he was not doing anything particularly strenuous at the time.  The pain lasted until after he arrived at the emergency room here, an hour and a half total.  He did not have any dyspnea or nausea but did have some diaphoresis.  The pain was not associated with exertion, activity or meals.  It was not pleuritic.  There was no cough or hemoptysis.  The patient denies any exertional chest pain in the recent past.  The patient states he had some similar pain about 2 to 3 weeks ago which lasted 30 to 45 minutes.  He has no known cardiac history.  The patient had a normal stress Cardiolite scan in 1999, at which time he saw Dr. Lacretia Nicks. Viann Fish, Montez Hageman.  By the time he saw me, he was pain-free and in no distress and comfortable.  PAST MEDICAL HISTORY:  Surgery:  The patient has had bilateral cataract extractions.  He had a retinal detachment operated on in 1994 and he had surgery on his shoulder in 1970.  Other hospitalizations:  None.  Other illnesses:  He has had hypertension for the past 30 years.  He also recently has had a history of mildly abnormal glucose tolerance, however, his last glucose was 98.  He also has had a recent history of mild hyperlipidemia with a cholesterol of 219, triglycerides of 196 and LDL of 146.  He also has a history of gout, although this is under  good control on current medications, and osteoarthritis.  CURRENT MEDICATIONS: 1. Micardis 80 mg once a day. 2. Diclofenac 75 mg b.i.d. 3. Probenecid/colchicine once a day. 4. HCTZ 25 mg once a day.  ALLERGIES:  None.  FAMILY HISTORY:  His father died at age 10 of a heart problem.  His mother died also at age 15 of a heart problem.  He has one living brother who has pancreatic cancer.  He has four sisters; one died of breast cancer, one died of COPD, the other two are alive and well.  He has two sons and three daughters, all in good health.  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  He has never used alcohol or tobacco. He is married.  REVIEW OF SYSTEMS:  No other systemic, skin, eye, ENT, respiratory, cardiovascular, GI, GU, musculoskeletal or neurological complaints.  PHYSICAL EXAMINATION:  VITAL SIGNS:  Blood pressure 153/68, pulse 90 and regular, respirations 20, temperature 97.3.  O2 saturation 97% on room air.  GENERAL:  He is alert and in no distress.  SKIN:  Warm and dry.  No rash.  HEENT:  Eyes:  Pupils equal, round and reactive to light.  Full EOMs.  Fundi benign.  Disks are flat.  He has had bilateral cataract extractions.  ENT: TMs are normal.  No intraoral lesions.  Mucous membranes moist.  Pharynx clear.  NECK:  Supple.  No adenopathy, JVD or bruit.  Thyroid is normal.  LUNGS:  Clear to auscultation and percussion.  HEART:  Regular rhythm.  No gallop or murmur.  CHEST:  No chest wall tenderness.  ABDOMEN:  Soft, nontender.  No organomegaly or mass.  Bowel sounds normally active.  EXTREMITIES:  No edema.  Pulses were full.  No calf tenderness and Homans sign negative.  NEUROLOGICAL:  Alert, oriented x3.  Speech is clear and appropriate.  No extremity weakness or tremor.  DTRs 2+ and symmetrical.  Babinskis downgoing. Cranial nerves intact.  LABORATORY AND ACCESSORY DATA:  EKG  showed normal sinus rhythm with no evidence of MI but he did have some mild nonspecific ST-T wave changes.  A chest x-ray showed no active disease.  IMPRESSION: 1. Chest pain possibly due to myocardial infarction, angina, pulmonary embolus    or musculoskeletal. 2. Hypertension. 3. Mild hyperlipidemia. 4. Mildly abnormal glucose tolerance. 5. Gout. 6. Osteoarthritis.  PLAN: 1. Monitor by telemetry. 2. Serial CPK, MBs and troponins. 3. Cardiology consult. 4. Spiral chest CT. Dictated by:   Reuben Likes, M.D. Attending Physician:  Cathren Laine DD:  09/09/01 TD:  09/10/01 Job: [id number]

## 2010-12-09 NOTE — H&P (Signed)
NAMEGARETH, [name] NO.:  [id number]   MEDICAL RECORD NO.:  [id number]           PATIENT TYPE:   LOCATION:                                 FACILITY:   PHYSICIAN:  Larina Earthly, M.D.         DATE OF BIRTH:   DATE OF ADMISSION:  12/06/2004  DATE OF DISCHARGE:                                HISTORY & PHYSICAL   ADMISSION DIAGNOSIS:  Symptomatic left internal carotid stenosis   HISTORY OF PRESENT ILLNESS:  The patient is a 77 year old white male who 2  weeks ago had an episode of right hand weakness and clumsiness, also  staggering gait and difficulty with speech.  This has improved but he still  has mild deficit, with right hand clumsiness.  He underwent workup to  include MRI and MRA and also CT scan of his head.  This CT scan revealed no  evidence of acute infarct.  He did have severe 85% internal carotid artery  stenosis by MRA.  He had had no prior neurologic deficits before this event.   PAST MEDICAL HISTORY:  Significant for hypertension.  He did have a  myocardial infarction 2 years ago and had a negative cardiac catheterization  at that time.   FAMILY HISTORY:  Negative for premature cardiac disease.   SOCIAL HISTORY:  He is married, with an invalid wife.  He does not smoke or  drink alcohol.  He does have some nervousness and anxiety associated with  care of his wife.   REVIEW OF SYSTEMS:  Positive for urinary frequency, arthritis, depression  and nervousness.   ALLERGIES:  He has no known drug allergies.   MEDICATIONS:  Norvasc, Toprol XL, Lisinopril, Lorazepam.   PHYSICAL EXAMINATION:  His weight is 230 pounds, height 5 feet 10 inches.  He is a well-nourished, moderately obese white male in no acute distress.  Blood pressure is 152/84, pulse 76, respirations 18.  He does have some mild  word searching as well as grossly intact neurologically.  Carotid arteries  are without bruits bilaterally, heart regular rate and rhythm, chest clear  bilaterally.  Abdominal exam reveals mild obesity with no evidence of  aneurysm.  He has 2+ femoral and 2+ dorsalis pedis pulses.   IMPRESSION:  Symptomatic left internal carotid artery stenosis.   PLAN:  The patient will be admitted on May 16 for carotid endarterectomy.  The procedure, including 1-2% risk of stroke with surgery, was discussed  with the patient who understands.      TFE/MEDQ  D:  12/01/2004  T:  12/01/2004  Job:  [id number]   cc:   Reuben Likes, M.D.  [address]  [place]  [place] [address]  Fax: [phone]
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
## 2011-01-09 ENCOUNTER — Other Ambulatory Visit: Payer: Self-pay | Admitting: *Deleted

## 2011-01-09 MED ORDER — BUPROPION HCL ER (XL) 150 MG PO TB24: 150.0000 mg | ORAL_TABLET | Freq: Every day | ORAL | Status: DC

## 2011-01-11 ENCOUNTER — Other Ambulatory Visit: Payer: Self-pay | Admitting: *Deleted

## 2011-01-11 MED ORDER — PANTOPRAZOLE SODIUM 40 MG PO TBEC: 40.0000 mg | DELAYED_RELEASE_TABLET | Freq: Every day | ORAL | Status: DC

## 2011-04-17 LAB — URINE MICROSCOPIC-ADD ON

## 2011-04-17 LAB — CBC
HCT: 45.2
Hemoglobin: 16.1
MCHC: 33.8
MCV: 85.9
Platelets: 156
RBC: 5.53
RDW: 13.7
WBC: 11 — ABNORMAL HIGH

## 2011-04-17 LAB — URINALYSIS, ROUTINE W REFLEX MICROSCOPIC
Bilirubin Urine: NEGATIVE
Glucose, UA: NEGATIVE
Ketones, ur: NEGATIVE
Specific Gravity, Urine: 1.012
pH: 5

## 2011-04-17 LAB — COMPREHENSIVE METABOLIC PANEL
ALT: 28
AST: 33
Albumin: 4
CO2: 30
Chloride: 105
GFR calc Af Amer: >60
GFR calc non Af Amer: >60
Potassium: 5.1
Sodium: 140
Total Bilirubin: 0.6

## 2011-04-17 LAB — BASIC METABOLIC PANEL
BUN: 14
Calcium: 9
GFR calc non Af Amer: >60
Potassium: 4.1
Sodium: 136

## 2011-04-19 LAB — POCT I-STAT, CHEM 8
BUN: 23
Calcium, Ion: 1.3
Chloride: 107
HCT: 47
Potassium: 4.8

## 2011-05-01 ENCOUNTER — Other Ambulatory Visit: Payer: Self-pay | Admitting: *Deleted

## 2011-05-01 MED ORDER — LORAZEPAM 1 MG PO TABS: 1.0000 mg | ORAL_TABLET | Freq: Three times a day (TID) | ORAL | Status: DC | PRN

## 2011-05-01 NOTE — Telephone Encounter (Signed)
Faxed script back to [facility] @ [phone]....05/01/11@1 :13pm/LMB

## 2011-05-07 ENCOUNTER — Emergency Department (INDEPENDENT_AMBULATORY_CARE_PROVIDER_SITE_OTHER): Payer: Medicare Other

## 2011-05-07 ENCOUNTER — Emergency Department (HOSPITAL_BASED_OUTPATIENT_CLINIC_OR_DEPARTMENT_OTHER)
Admission: EM | Admit: 2011-05-07 | Discharge: 2011-05-07 | Disposition: A | Discharge status: Home / Self Care | Payer: Medicare Other | Attending: Emergency Medicine | Admitting: Emergency Medicine

## 2011-05-07 ENCOUNTER — Encounter (HOSPITAL_BASED_OUTPATIENT_CLINIC_OR_DEPARTMENT_OTHER): Payer: Self-pay | Admitting: *Deleted

## 2011-05-07 DIAGNOSIS — M1710 Unilateral primary osteoarthritis, unspecified knee: Secondary | ICD-10-CM

## 2011-05-07 DIAGNOSIS — I10 Essential (primary) hypertension: Secondary | ICD-10-CM | POA: Insufficient documentation

## 2011-05-07 DIAGNOSIS — G4730 Sleep apnea, unspecified: Secondary | ICD-10-CM | POA: Insufficient documentation

## 2011-05-07 DIAGNOSIS — IMO0002 Reserved for concepts with insufficient information to code with codable children: Secondary | ICD-10-CM | POA: Insufficient documentation

## 2011-05-07 DIAGNOSIS — I252 Old myocardial infarction: Secondary | ICD-10-CM | POA: Insufficient documentation

## 2011-05-07 DIAGNOSIS — E669 Obesity, unspecified: Secondary | ICD-10-CM | POA: Insufficient documentation

## 2011-05-07 DIAGNOSIS — Z79899 Other long term (current) drug therapy: Secondary | ICD-10-CM | POA: Insufficient documentation

## 2011-05-07 DIAGNOSIS — M109 Gout, unspecified: Secondary | ICD-10-CM | POA: Insufficient documentation

## 2011-05-07 DIAGNOSIS — E785 Hyperlipidemia, unspecified: Secondary | ICD-10-CM | POA: Insufficient documentation

## 2011-05-07 DIAGNOSIS — Z8679 Personal history of other diseases of the circulatory system: Secondary | ICD-10-CM | POA: Insufficient documentation

## 2011-05-07 DIAGNOSIS — I509 Heart failure, unspecified: Secondary | ICD-10-CM | POA: Insufficient documentation

## 2011-05-07 DIAGNOSIS — M25569 Pain in unspecified knee: Secondary | ICD-10-CM

## 2011-05-07 DIAGNOSIS — G2581 Restless legs syndrome: Secondary | ICD-10-CM

## 2011-05-07 DIAGNOSIS — M25469 Effusion, unspecified knee: Secondary | ICD-10-CM

## 2011-05-07 MED ORDER — DIAZEPAM 5 MG PO TABS: 5.0000 mg | ORAL_TABLET | Freq: Three times a day (TID) | ORAL | Status: AC | PRN

## 2011-05-07 MED ORDER — DIAZEPAM 5 MG PO TABS
5.0000 mg | ORAL_TABLET | Freq: Once | ORAL | Status: AC
  Administered 2011-05-07: 5 mg via ORAL
  Filled 2011-05-07: qty 1

## 2011-05-07 MED ORDER — OXYCODONE-ACETAMINOPHEN 5-325 MG PO TABS
1.0000 | ORAL_TABLET | Freq: Once | ORAL | Status: AC
  Administered 2011-05-07: 1 via ORAL
  Filled 2011-05-07 (×2): qty 1

## 2011-05-07 MED ORDER — OXYCODONE-ACETAMINOPHEN 5-325 MG PO TABS: 1.0000 | ORAL_TABLET | Freq: Four times a day (QID) | ORAL | Status: AC | PRN

## 2011-05-07 MED ORDER — HYDROMORPHONE HCL 1 MG/ML IJ SOLN
1.0000 mg | Freq: Once | INTRAMUSCULAR | Status: AC
  Administered 2011-05-07: 1 mg via INTRAMUSCULAR
  Filled 2011-05-07: qty 1

## 2011-05-07 NOTE — ED Provider Notes (Signed)
Scribed for Celene Kras, MD, the patient was seen in room [facility] . This chart was scribed by Ellie Lunch. This patient's care was started at 8:29 PM.   CSN: [id number] Arrival date & time: 05/07/2011  8:18 PM  Chief Complaint  Patient presents with  . Leg Pain    (Consider location/radiation/quality/duration/timing/severity/associated sxs/prior treatment) HPI [name] is a 77 y.o. male who presents to the Emergency Department complaining of leg pain, restless legs. Pt reports a history of restless legs and reports it has become progressively worse the past 2 nights.  Pain is aggravated by keeping legs still. Pt takes requip for his restless leg syndrome and has not had improvement. Pt reports that oxycodone usually improves pain.  States that he has not been able to sleep for several nights. Denies fever, chill, cough, SOB, any new trauma to legs, pedal swelling.    Past Medical History  Diagnosis Date  . Gout, unspecified 11/17/2009  . HYPERLIPIDEMIA 07/30/2006  . OBESITY 08/27/2009  . Anxiety state, unspecified 06/27/2010  . RESTLESS LEG SYNDROME 07/30/2006  . DETACHMENT, RETINAL NOS 07/24/1992  . CATARACT NOS 07/30/2006  . HYPERTENSION 07/30/2006  . MYOCARDIAL INFARCTION 07/24/2002  . CORONARY ARTERY DISEASE 08/27/2009  . PERICARDITIS, ACUTE NEC 11/17/2009  . CHF 05/06/2010  . STROKE 08/30/2009  . GERD 08/30/2009  . GASTRITIS 12/10/2009  . UTI 11/17/2009  . DEGENERATIVE JOINT DISEASE 07/30/2006  . SLEEP APNEA, MILD 07/30/2006  . CARDIAC MURMUR 08/30/2009  . CAROTID ENDARTERECTOMY, LEFT, HX OF 07/30/2006  . INSOMNIA, CHRONIC 08/08/2010  . DEPRESSION 08/08/2010  . OBSTRUCTIVE SLEEP APNEA 08/23/2010  . DYSPHAGIA UNSPECIFIED 09/06/2010  . TIA (transient ischemic attack)   . Urine incontinence     Past Surgical History  Procedure Date  . Coronary angioplasty 01/2009  . Pericardial window 2007  . Left arm 2008  . Right arm   . Cataract extraction     Left side x's 2 and right  . Retinal  detachment surgery     left side  . Shoulder surgery     bilateral  . Left foot      Family History  Problem Relation Age of Onset  . Heart disease Mother   . Diabetes Father   . Heart disease Father   . Breast cancer Sister   . Prostate cancer Brother   . Lung cancer Brother     History  Substance Use Topics  . Smoking status: Never Smoker   . Smokeless tobacco: Never Used   Comment: Pt is widowed and lives alone. Pt has children. Retired from [employer]  . Alcohol Use: No     Review of Systems  Constitutional: Negative for fever and chills.  Respiratory: Negative for cough and shortness of breath.   Cardiovascular: Negative for leg swelling.  Musculoskeletal:       Leg pain  All other systems reviewed and are negative.    Allergies  Atorvastatin  Home Medications   Current Outpatient Rx  Name Route Sig Dispense Refill  . ALBUTEROL SULFATE HFA 108 (90 BASE) MCG/ACT IN AERS Inhalation Inhale 2 puffs into the lungs every 4 (four) hours as needed.      . ASPIRIN 81 MG PO TABS Oral Take 81 mg by mouth daily.      . BUPROPION HCL ER (XL) 150 MG PO TB24 Oral Take 1 tablet (150 mg total) by mouth daily. 30 tablet 5  . VITAMIN D 1000 UNITS PO TABS Oral Take  1,000 Units by mouth daily.      Marland Kitchen CLOPIDOGREL BISULFATE 75 MG PO TABS Oral Take 75 mg by mouth daily.      . COLCHICINE 0.6 MG PO TABS Oral Take 0.6 mg by mouth daily.      . FUROSEMIDE 40 MG PO TABS Oral Take 40 mg by mouth 2 (two) times daily.      Marland Kitchen GABAPENTIN 300 MG PO CAPS Oral Take 300 mg by mouth 2 (two) times daily.      Marland Kitchen LORAZEPAM 1 MG PO TABS Oral Take 1 tablet (1 mg total) by mouth every 8 (eight) hours as needed for anxiety. 60 tablet 2  . METOPROLOL TARTRATE 50 MG PO TABS Oral Take 50 mg by mouth 2 (two) times daily.      Marland Kitchen MIRTAZAPINE 15 MG PO TABS Oral Take 15 mg by mouth at bedtime.      Marland Kitchen NITROGLYCERIN 0.4 MG SL SUBL Sublingual Place 0.4 mg under the tongue every 5 (five) minutes as  needed.      . OXYBUTYNIN CHLORIDE 5 MG PO TABS Oral Take 5 mg by mouth 2 (two) times daily.      . OXYCODONE HCL 5 MG PO CAPS Oral Take 5 mg by mouth every 12 (twelve) hours as needed.      Marland Kitchen PANTOPRAZOLE SODIUM 40 MG PO TBEC Oral Take 1 tablet (40 mg total) by mouth daily. 30 tablet 5  . PEGLOTICASE 8 MG/ML IV SOLN Intravenous Inject into the vein. IV 2 x per month for gout     . POTASSIUM CHLORIDE CRYS CR 20 MEQ PO TBCR Oral Take 20 mEq by mouth 2 (two) times daily.      Marland Kitchen PRAVASTATIN SODIUM 20 MG PO TABS Oral Take 20 mg by mouth at bedtime.      Marland Kitchen ROPINIROLE HCL 4 MG PO TABS Oral Take 4 mg by mouth at bedtime.      . E-Z SPACER DEVI Other by Other route as needed. Use as instructed       BP 140/61  Pulse 64  Temp(Src) 97.9 F (36.6 C) (Oral)  Resp 20  SpO2 95%  Physical Exam  Nursing note and vitals reviewed. Constitutional: He appears well-developed and well-nourished. No distress.  HENT:  Head: Normocephalic and atraumatic.  Right Ear: External ear normal.  Left Ear: External ear normal.  Eyes: Conjunctivae are normal. Right eye exhibits no discharge. Left eye exhibits no discharge. No scleral icterus.  Neck: Neck supple. No tracheal deviation present.  Cardiovascular: Normal rate, regular rhythm and normal heart sounds.   Pulmonary/Chest: Effort normal and breath sounds normal. No stridor. No respiratory distress.  Musculoskeletal: He exhibits no edema.       Strong Pedal pulses Mild venostasis changes. No edema, erythema.  Gouty tophi. No effusions or increased warmth.   Neurological: He is alert. Cranial nerve deficit: no gross deficits.  Skin: Skin is warm and dry. No rash noted.  Psychiatric: He has a normal mood and affect.   Procedures (including critical care time)  OTHER DATA REVIEWED: Nursing notes, vital signs, and past medical records reviewed.   DIAGNOSTIC STUDIES: Oxygen Saturation is 95% on room air, adequate by my interpretation.    LABS /  RADIOLOGY:  Dg Knee Complete 4 Views Right  05/07/2011  *RADIOLOGY REPORT*  Clinical Data: Pain.  Restless legs.  Worsening over two nights.  RIGHT KNEE - COMPLETE 4+ VIEW  Comparison: None.  Findings: Moderate degenerative changes in  the right knee with mild tricompartmental narrowing and osteophytosis.  Cartilaginous calcification in the medial and lateral compartments.  Moderate right knee effusion with calcification apparently in the quadriceps muscles.  Prominent soft tissue nodular density in the infrapatellar space.  No focal bone or cortical destruction.  No evidence of acute fracture or subluxation.  Vascular calcifications.  IMPRESSION: Degenerative changes in the right knee with cartilaginous calcification.  Small effusion.  Calcification of the quadriceps muscles may represent dystrophic calcification or myositis.  Large soft tissue nodule in the anterior infrapatellar space.  No bone destruction.  No acute fracture.  Original Report Authenticated By: Marlon Pel, M.D.    ED COURSE / COORDINATION OF CARE: The patient initially was given a by mouth Percocet without relief. Patient was then given Dilaudid injection with Valium 5 mg by mouth  10:37 PM Pt feeling better.  Ready for discharge MDM: Patient has history of restless leg syndrome. On exam he does not have any evidence to suggest cellulitis. He has strong pulses without evidence of acute vascular insufficiency. There is no back pain to suggest a radicular component. I suspect this pain is related to his restless leg is patient describes this as similar to that.  MEDICATIONS GIVEN IN THE E.D.  Medications  oxyCODONE-acetaminophen (PERCOCET) 5-325 MG per tablet 1 tablet (not administered)  Diluadid IM and valium po SCRIBE ATTESTATION: I personally performed the services described in this documentation, which was scribed in my presence.  The recorded information has been reviewed and considered.        Celene Kras,  MD 05/07/11 [phone]

## 2011-05-07 NOTE — ED Notes (Signed)
Patient has restless leg syndrome, and take requip. States that he has not been able to sleep for several nights. Currently unable to keep still for longer than a few seconds.

## 2011-05-15 ENCOUNTER — Ambulatory Visit: Payer: Medicare Other | Admitting: Pulmonary Disease

## 2011-05-19 ENCOUNTER — Ambulatory Visit: Payer: Medicare Other | Admitting: Pulmonary Disease

## 2011-05-30 ENCOUNTER — Encounter: Payer: Self-pay | Admitting: Pulmonary Disease

## 2011-05-30 ENCOUNTER — Ambulatory Visit (INDEPENDENT_AMBULATORY_CARE_PROVIDER_SITE_OTHER): Payer: Medicare Other | Admitting: Pulmonary Disease

## 2011-05-30 VITALS — BP 120/54 | HR 65 | Temp 97.5°F | Ht 69.0 in | Wt 244.4 lb

## 2011-05-30 DIAGNOSIS — G4733 Obstructive sleep apnea (adult) (pediatric): Secondary | ICD-10-CM

## 2011-05-30 NOTE — Assessment & Plan Note (Signed)
The patient has been wearing CPAP compliantly at his current setting, and has seen definite improvement in his sleep and daytime energy and alertness.  Unfortunately, we never received any type of downloaded from his DME, and it is unclear whether he is on auto or still a set pressure.  The patient is also unsure how to operate the heat humidifier.  We'll get him back to his medical provider, and hopefully we can sort out all of these questions.  I have encouraged the patient to work aggressively on weight loss.

## 2011-05-30 NOTE — Patient Instructions (Signed)
Will try and get your cpap machine adjusted appropriately Will have your equipment provider show you how to use heater. Work on weight loss followup with me in one year if doing well.

## 2011-05-30 NOTE — Progress Notes (Signed)
  Subjective:    Patient ID: [name], male    DOB: 12-Nov-1932, 77 y.o.   MRN: [id number]  HPI The patient comes in today for followup of his known obstructive sleep apnea.  He has been wearing CPAP compliantly, and feels that he is seen a big difference in his sleep and daytime alertness.  However, it is unclear whether he is currently on a set pressure, or on an automatic mode.  We never received a download from his medical equipment provider.  We will need to sort out whether his pressure has been optimized.  He denies any issues with mask leaking.   Review of Systems  Constitutional: Negative for fever and unexpected weight change.  HENT: Negative for ear pain, nosebleeds, congestion, sore throat, rhinorrhea, sneezing, trouble swallowing, dental problem, postnasal drip and sinus pressure.   Eyes: Negative for redness and itching.  Respiratory: Positive for shortness of breath. Negative for cough, chest tightness and wheezing.   Cardiovascular: Positive for leg swelling. Negative for palpitations.  Gastrointestinal: Negative for nausea and vomiting.  Genitourinary: Positive for dysuria.  Musculoskeletal: Negative for joint swelling.  Skin: Negative for rash.  Neurological: Negative for headaches.  Hematological: Bruises/bleeds easily.  Psychiatric/Behavioral: Negative for dysphoric mood. The patient is not nervous/anxious.        Objective:   Physical Exam Overweight male in no acute distress no skin breakdown or pressure necrosis from the CPAP mask Lower extremities with mild edema, no cyanosis noted Alert and oriented, moves all 4 extremities.      Assessment & Plan:

## 2011-08-09 ENCOUNTER — Other Ambulatory Visit: Payer: Self-pay | Admitting: *Deleted

## 2011-08-09 MED ORDER — PANTOPRAZOLE SODIUM 40 MG PO TBEC: 40.0000 mg | DELAYED_RELEASE_TABLET | Freq: Every day | ORAL | Status: DC

## 2011-08-14 ENCOUNTER — Other Ambulatory Visit: Payer: Self-pay | Admitting: *Deleted

## 2011-08-14 MED ORDER — BUPROPION HCL ER (XL) 150 MG PO TB24: 150.0000 mg | ORAL_TABLET | Freq: Every day | ORAL | Status: DC

## 2011-08-14 NOTE — Telephone Encounter (Signed)
R'cd fax from [facility] for refill of Wellbutrin.  Last OV-08/2010 Last filled 07/10/2011

## 2011-09-21 ENCOUNTER — Other Ambulatory Visit (INDEPENDENT_AMBULATORY_CARE_PROVIDER_SITE_OTHER): Payer: Medicare Other | Admitting: *Deleted

## 2011-09-21 DIAGNOSIS — I6529 Occlusion and stenosis of unspecified carotid artery: Secondary | ICD-10-CM

## 2011-09-21 DIAGNOSIS — Z48812 Encounter for surgical aftercare following surgery on the circulatory system: Secondary | ICD-10-CM

## 2011-10-06 ENCOUNTER — Other Ambulatory Visit: Payer: Self-pay | Admitting: *Deleted

## 2011-10-06 DIAGNOSIS — Z48812 Encounter for surgical aftercare following surgery on the circulatory system: Secondary | ICD-10-CM

## 2011-10-06 DIAGNOSIS — I6529 Occlusion and stenosis of unspecified carotid artery: Secondary | ICD-10-CM

## 2011-10-06 NOTE — Procedures (Unsigned)
CAROTID DUPLEX EXAM  INDICATION:  Carotid endarterectomy.  HISTORY: Diabetes:  No Cardiac:  CAD, MI Hypertension:  Yes Smoking:  No Previous Surgery:  Left carotid endarterectomy on 12/06/2005 CV History:  Currently asymptomatic Amaurosis Fugax No, Paresthesias No, Hemiparesis No                                      RIGHT             LEFT Brachial systolic pressure:         126               122 Brachial Doppler waveforms:         Normal            Normal Vertebral direction of flow:        Antegrade         Antegrade DUPLEX VELOCITIES (cm/sec) CCA peak systolic                   55                94 ECA peak systolic                   317               193 ICA peak systolic                   94                58 ICA end diastolic                   19                12 PLAQUE MORPHOLOGY:                  Mixed             Homogeneous PLAQUE AMOUNT:                      Moderate          Mild PLAQUE LOCATION:                    ICA/ECA/CCA       ECA  IMPRESSION: 1. No hemodynamically significant stenosis of the right internal     carotid artery with plaque formation as described above. 2. Patent left carotid endarterectomy site with no left internal     carotid artery stenosis. 3. Bilateral external carotid artery stenoses noted. 4. No significant change noted when compared to the previous exam on     09/20/2010.  ___________________________________________ Larina Earthly, M.D.  CH/MEDQ  D:  09/25/2011  T:  09/25/2011  Job:  [id number]

## 2011-10-09 ENCOUNTER — Encounter: Payer: Self-pay | Admitting: Vascular Surgery

## 2011-11-06 ENCOUNTER — Other Ambulatory Visit: Payer: Self-pay | Admitting: *Deleted

## 2011-11-06 NOTE — Telephone Encounter (Signed)
Received fax for renewal on pt lorazepam. Faxed back requesting denied pt no longer see Dr. Felicity Coyer. His new PCP is Dr. Lucky Cowboy.... 11/06/11@12 :08

## 2011-11-28 ENCOUNTER — Ambulatory Visit (INDEPENDENT_AMBULATORY_CARE_PROVIDER_SITE_OTHER): Payer: Medicare Other | Admitting: Internal Medicine

## 2011-11-28 ENCOUNTER — Encounter: Payer: Self-pay | Admitting: Internal Medicine

## 2011-11-28 VITALS — BP 130/72 | HR 56 | Temp 96.9°F | Ht 68.0 in | Wt 227.4 lb

## 2011-11-28 DIAGNOSIS — N400 Enlarged prostate without lower urinary tract symptoms: Secondary | ICD-10-CM | POA: Insufficient documentation

## 2011-11-28 DIAGNOSIS — M109 Gout, unspecified: Secondary | ICD-10-CM

## 2011-11-28 DIAGNOSIS — G2581 Restless legs syndrome: Secondary | ICD-10-CM

## 2011-11-28 DIAGNOSIS — G4733 Obstructive sleep apnea (adult) (pediatric): Secondary | ICD-10-CM

## 2011-11-28 MED ORDER — PREGABALIN 100 MG PO CAPS: 100.0000 mg | ORAL_CAPSULE | Freq: Two times a day (BID) | ORAL | Status: DC

## 2011-11-28 MED ORDER — OXYCODONE HCL 5 MG PO CAPS: 5.0000 mg | ORAL_CAPSULE | Freq: Two times a day (BID) | ORAL | Status: DC | PRN

## 2011-11-28 NOTE — Progress Notes (Signed)
Subjective:    Patient ID: [name], male    DOB: 1933-02-04, 78 y.o.   MRN: [id number]  HPI Last OV 08/2010 - transfer PCP back to this office  Here for follow up - reviewed chronic medical issues:  Gout, severe dz - hx tophi - on Krystexxa injections in 2012 but resumed allopurinol spring 2013  due to elevation in uric acid levels - works with rheum for same - no recent flares, usus Lyrica and oxy as needed - requests refills  depression - onset summer 2011 following death of wife   associated with chronic insomnia started wellbutrin for same 07/2010 - added Remeron summer 2012 to help sleep Currently symptoms well controlled "but i still get lonesome"   BPH/overactive bladder - working with uro on same ineffective vesicare - improved with oxybutynin Also started flomax 2012 for BPH - improved   history steroid induced DM2 - dx hosp 03/2010 at admission when cbg high after steroid shot in knee 2 days prior to hosp - never on meds for same - no polydipsia or blurred vision- does not monitor home cbgs   OSA - CPAP since 2011 - feels his sleep is good, follows with pulm every 6 months  Past Medical History  Diagnosis Date  . Gout, unspecified     severe dz  . HYPERLIPIDEMIA     intolerant of statins  . OBESITY   . Anxiety state, unspecified   . RESTLESS LEG SYNDROME   . DETACHMENT, RETINAL NOS 1994  . CATARACT NOS 2008  . HYPERTENSION   . MYOCARDIAL INFARCTION 2004, 03/2010  . CORONARY ARTERY DISEASE   . PERICARDITIS, ACUTE NEC 2007    MSSA, s/p pericardial window  . CHF   . GERD   . DEGENERATIVE JOINT DISEASE   . CAROTID ENDARTERECTOMY, LEFT, HX OF   . INSOMNIA, CHRONIC   . DEPRESSION   . OBSTRUCTIVE SLEEP APNEA     CPAP qhs  . TIA (transient ischemic attack) 2005  . BPH (benign prostatic hypertrophy)     Review of Systems  Constitutional: Negative for fever and fatigue.  Respiratory: Negative for cough and shortness of breath.   Cardiovascular: Negative  for chest pain and leg swelling.  Neurological: Negative for headaches.  Psychiatric/Behavioral: Negative for hallucinations, confusion, dysphoric mood and decreased concentration. The patient is not nervous/anxious.        Objective:   Physical Exam BP 130/72  Pulse 56  Temp(Src) 96.9 F (36.1 C) (Oral)  Ht 5\' 8"  (1.727 m)  Wt 227 lb 6.4 oz (103.148 kg)  BMI 34.58 kg/m2  SpO2 96% Wt Readings from Last 3 Encounters:  11/28/11 227 lb 6.4 oz (103.148 kg)  05/30/11 244 lb 6.4 oz (110.859 kg)  11/11/10 244 lb (110.678 kg)   Constitutional:  He appears well-developed and well-nourished. No distress.  Cardiovascular: Normal rate, regular rhythm and normal heart sounds.  No murmur heard. no BLE edema Pulmonary/Chest: Effort normal and breath sounds normal. No respiratory distress. no wheezes.  Musculoskeletal: no gross deformities. Mild tophi on B knees and UE - Hands with OA changes, no acute gouty flare.  Neurological: he is alert and oriented to person, place, and time. No cranial nerve deficit. Coordination normal.  Skin: AKs and mild diffuse bruising on extremities. Skin is warm and dry.  No erythema or ulceration.  Psychiatric: he has a normal mood and affect. behavior is normal. Judgment and thought content normal.   Lab Results  Component Value Date  WBC 11.1 05/20/2010   HGB 15.0 05/20/2010   HCT 47.1 05/20/2010   PLT 226 05/20/2010   GLUCOSE 139 05/20/2010   CHOL  Value: 187           04/14/2010   TRIG 154* 04/14/2010   HDL 41 04/14/2010   LDLDIRECT 141.5 11/01/2009   LDLCALC  Value: 115   04/14/2010   ALT 15 05/20/2010   AST 25 05/20/2010   NA 139 05/20/2010   K 4.1 05/20/2010   CL 90 05/20/2010   CREATININE 2.235 05/20/2010   BUN 78 05/20/2010   CO2 35 05/20/2010   TSH 0.726 04/14/2010   INR 0.96 04/15/2010   HGBA1C  Value: 6.2  04/16/2010       Assessment & Plan:  See problem list. Medications and labs reviewed today.

## 2011-11-28 NOTE — Assessment & Plan Note (Signed)
Hx severe, resistant dz and tophi -  2012 treatment with Krystexxa stopped due to increased uric acid levels Back on Allopurinol since spring 2013 Follows with rheum for same No acute flares

## 2011-11-28 NOTE — Assessment & Plan Note (Signed)
CPAP qhs since 2012 Sleep symptoms improved - follows with pulm q48mo

## 2011-11-28 NOTE — Assessment & Plan Note (Signed)
Chronic symptoms - improved with Lyrica Increase dose now (also for gout and other neuropathic pain) Continue Requip and iron also

## 2011-11-28 NOTE — Assessment & Plan Note (Signed)
Follows with uro q13mo - The current medical regimen is effective;  continue present plan and medications.

## 2011-11-28 NOTE — Patient Instructions (Signed)
It was good to see you today. We have reviewed your prior records including labs and tests today Medications reviewed, updated at this time. Will increase Lyrica to 100mg  2x/day and use Oxycodone as needed for breakthrough pain- Your prescription(s) have been  Given to you to submit to your local pharmacy. Please take as directed and contact our office if you believe you are having problem(s) with the medication(s). Please schedule followup in 3-4 months, call sooner if problems.

## 2011-12-07 ENCOUNTER — Telehealth: Payer: Self-pay | Admitting: Internal Medicine

## 2011-12-07 NOTE — Telephone Encounter (Signed)
Received copies from [facility] ,on 12/07/11. Forwarded 4 pages to Dr. Felicity Coyer ,for review.

## 2011-12-20 ENCOUNTER — Ambulatory Visit (INDEPENDENT_AMBULATORY_CARE_PROVIDER_SITE_OTHER): Payer: Medicare Other | Admitting: Internal Medicine

## 2011-12-20 ENCOUNTER — Encounter: Payer: Self-pay | Admitting: Internal Medicine

## 2011-12-20 VITALS — BP 120/62 | HR 57 | Temp 97.3°F | Ht 69.0 in | Wt 228.8 lb

## 2011-12-20 DIAGNOSIS — J309 Allergic rhinitis, unspecified: Secondary | ICD-10-CM

## 2011-12-20 DIAGNOSIS — M48061 Spinal stenosis, lumbar region without neurogenic claudication: Secondary | ICD-10-CM

## 2011-12-20 DIAGNOSIS — R234 Changes in skin texture: Secondary | ICD-10-CM

## 2011-12-20 MED ORDER — LORATADINE 10 MG PO TABS: 10.0000 mg | ORAL_TABLET | Freq: Every day | ORAL | Status: DC | PRN

## 2011-12-20 MED ORDER — LORAZEPAM 1 MG PO TABS: 1.0000 mg | ORAL_TABLET | Freq: Three times a day (TID) | ORAL | Status: DC | PRN

## 2011-12-20 MED ORDER — FLUTICASONE PROPIONATE 50 MCG/ACT NA SUSP: 2.0000 | Freq: Every day | NASAL | Status: DC

## 2011-12-20 NOTE — Progress Notes (Signed)
Subjective:    Patient ID: [name], male    DOB: 18-Aug-1932, 78 y.o.   MRN: [id number]  Sinusitis This is a recurrent problem. The current episode started 1 to 4 weeks ago. The problem has been gradually improving since onset. There has been no fever. He is experiencing no pain. Associated symptoms include congestion and sneezing. Pertinent negatives include no coughing, headaches, hoarse voice or shortness of breath. Past treatments include oral decongestants and sitting up (claritin). The treatment provided moderate relief.   Also complains of leg weakness with activity. associated with chronic LBP for years, no acute or new change. No leg swelling or joint swelling. No pain in legs with activity.  Past Medical History  Diagnosis Date  . Gout, unspecified     severe dz  . HYPERLIPIDEMIA     intolerant of statins  . OBESITY   . Anxiety state, unspecified   . RESTLESS LEG SYNDROME   . DETACHMENT, RETINAL NOS 1994  . CATARACT NOS 2008  . HYPERTENSION   . MYOCARDIAL INFARCTION 2004, 03/2010  . CORONARY ARTERY DISEASE   . PERICARDITIS, ACUTE NEC 2007    MSSA, s/p pericardial window  . CHF   . GERD   . DEGENERATIVE JOINT DISEASE   . CAROTID ENDARTERECTOMY, LEFT, HX OF   . INSOMNIA, CHRONIC   . DEPRESSION   . OBSTRUCTIVE SLEEP APNEA     CPAP qhs  . TIA (transient ischemic attack) 2005  . BPH (benign prostatic hypertrophy)     Review of Systems  Constitutional: Negative for fever and fatigue.  HENT: Positive for congestion and sneezing. Negative for hoarse voice.   Respiratory: Negative for cough and shortness of breath.   Cardiovascular: Negative for chest pain and leg swelling.  Neurological: Negative for headaches.  Psychiatric/Behavioral: Negative for hallucinations, confusion, dysphoric mood and decreased concentration. The patient is not nervous/anxious.        Objective:   Physical Exam  BP 120/62  Pulse 57  Temp(Src) 97.3 F (36.3 C) (Oral)  Ht 5\' 9"   (1.753 m)  Wt 228 lb 12.8 oz (103.783 kg)  BMI 33.79 kg/m2  SpO2 97% Wt Readings from Last 3 Encounters:  12/20/11 228 lb 12.8 oz (103.783 kg)  11/28/11 227 lb 6.4 oz (103.148 kg)  05/30/11 244 lb 6.4 oz (110.859 kg)   Constitutional:  He appears well-developed and well-nourished. No distress.  HENT: NCAT, sinus mildly tender to palpation, nares with thick discharge and swollen turbinates, OP with erythema and thick PND, no exudate Cardiovascular: Normal rate, regular rhythm and normal heart sounds.  No murmur heard. no BLE edema Pulmonary/Chest: Effort normal and breath sounds normal. No respiratory distress. no wheezes.  Musculoskeletal: no gross deformities. Mild tophi on B knees and UE - Hands with OA changes, no acute gouty flare. Back: full range of motion of thoracic and lumbar spine. Non tender to palpation. Negative straight leg raise. DTR's are symmetrically intact. Sensation intact in all dermatomes of the lower extremities. Slightly diminished B quad strength to manual muscle testing. patient is not able to heel toe walk without difficulty and ambulates with bend forward, antalgic gait. Skin: AKs and mild diffuse bruising on extremities. Skin is warm and dry.  No erythema or ulceration.  Psychiatric: he has a normal mood and affect. behavior is normal. Judgment and thought content normal.   Lab Results  Component Value Date   WBC 11.1 05/20/2010   HGB 15.0 05/20/2010   HCT 47.1 05/20/2010  PLT 226 05/20/2010   GLUCOSE 139 05/20/2010   CHOL  Value: 187        ATP III CLASSIFICATION:  <200     mg/dL   Desirable  454-098  mg/dL   Borderline High  >=119    mg/dL   High        1/47/8295   TRIG 154* 04/14/2010   HDL 41 04/14/2010   LDLDIRECT 141.5 11/01/2009   LDLCALC  Value: 115         04/14/2010   ALT 15 05/20/2010   AST 25 05/20/2010   NA 139 05/20/2010   K 4.1 05/20/2010   CL 90 05/20/2010   CREATININE 2.235 05/20/2010   BUN 78 05/20/2010   CO2 35 05/20/2010   TSH 0.726  04/14/2010   INR 0.96 04/15/2010   HGBA1C  Value: 6.2 (NOTE)                                                               04/16/2010   MRI L spine 08/2002: IMPRESSION  1) MULTILEVEL SEVERE SPINAL STENOSIS. THE STENOSIS IS MOST SEVERE AT L3-4 AND L4-5 DUE TO A COMBINATION OF SEVERE DISK DEGENERATION, SPURS, AND AT L4-5 SOFT DISK MATERIAL.  2) SEVERE SPINAL STENOSIS AT L5-S1 WITH MARKED ASYMMETRY OF THE SPURS INTO THE LEFT LATERAL  RECESS WITH PROBABLE COMPRESSION OF THE LEFT S1 NERVE ROOT.  3) DIFFUSE SEVERE DEGENERATIVE FACET JOINT ARTHRITIS AT L3-4, L4-5, AND L5-S1 BILATERALLY      Assessment & Plan:    Allergic sinusitis - improved with OTC Claritin, add nasal steroids and use decongestants only prn to avoid BP elevation - no indication for antibiotics at this time  Back pain with BLE fatigability - suspect progressive spinal stenosis based on 08/2002 MRI findings - will refer to back specialist at pt request due to progressive LE symptoms   AKs - will refer to derm for eval and tx same at pt request

## 2011-12-20 NOTE — Patient Instructions (Signed)
It was good to see you today. Continue the Claritin and start allergy nose spray daily - Your prescription(s) have been submitted to your pharmacy. Please take as directed and contact our office if you believe you are having problem(s) with the medication(s). we'll make referral to back specialists for your leg weakness and to dermatologist for your skin chages. Our office will contact you regarding appointment(s) once made. Other Medications reviewed, no changes at this time.

## 2012-01-02 ENCOUNTER — Other Ambulatory Visit: Payer: Self-pay | Admitting: Rehabilitation

## 2012-01-02 DIAGNOSIS — M549 Dorsalgia, unspecified: Secondary | ICD-10-CM

## 2012-01-15 ENCOUNTER — Inpatient Hospital Stay: Admission: RE | Admit: 2012-01-15 | Payer: Medicare Other | Source: Ambulatory Visit

## 2012-01-16 ENCOUNTER — Encounter: Payer: Medicare Other | Admitting: Internal Medicine

## 2012-01-23 ENCOUNTER — Ambulatory Visit
Admission: RE | Admit: 2012-01-23 | Discharge: 2012-01-23 | Disposition: A | Discharge status: Home / Self Care | Payer: Medicare Other | Source: Ambulatory Visit | Attending: Rehabilitation | Admitting: Rehabilitation

## 2012-01-23 DIAGNOSIS — M549 Dorsalgia, unspecified: Secondary | ICD-10-CM

## 2012-01-29 ENCOUNTER — Other Ambulatory Visit: Payer: Self-pay

## 2012-03-01 ENCOUNTER — Encounter: Payer: Self-pay | Admitting: Internal Medicine

## 2012-03-01 ENCOUNTER — Ambulatory Visit (INDEPENDENT_AMBULATORY_CARE_PROVIDER_SITE_OTHER): Payer: Medicare Other | Admitting: Internal Medicine

## 2012-03-01 ENCOUNTER — Other Ambulatory Visit (INDEPENDENT_AMBULATORY_CARE_PROVIDER_SITE_OTHER): Payer: Medicare Other

## 2012-03-01 VITALS — BP 122/60 | HR 65 | Temp 97.6°F | Ht 69.0 in | Wt 237.0 lb

## 2012-03-01 DIAGNOSIS — E119 Type 2 diabetes mellitus without complications: Secondary | ICD-10-CM

## 2012-03-01 DIAGNOSIS — I359 Nonrheumatic aortic valve disorder, unspecified: Secondary | ICD-10-CM | POA: Insufficient documentation

## 2012-03-01 DIAGNOSIS — Z136 Encounter for screening for cardiovascular disorders: Secondary | ICD-10-CM

## 2012-03-01 DIAGNOSIS — Z79899 Other long term (current) drug therapy: Secondary | ICD-10-CM

## 2012-03-01 DIAGNOSIS — E669 Obesity, unspecified: Secondary | ICD-10-CM

## 2012-03-01 DIAGNOSIS — E785 Hyperlipidemia, unspecified: Secondary | ICD-10-CM

## 2012-03-01 DIAGNOSIS — Z0000 Encounter for general adult medical examination without abnormal findings: Secondary | ICD-10-CM

## 2012-03-01 LAB — BASIC METABOLIC PANEL
BUN: 28 mg/dL — ABNORMAL HIGH (ref 6–23)
Chloride: 100 mEq/L (ref 96–112)
GFR: 50.15 mL/min — ABNORMAL LOW (ref >60.00)
Glucose, Bld: 99 mg/dL (ref 70–99)
Potassium: 4.8 mEq/L (ref 3.5–5.1)
Sodium: 139 mEq/L (ref 135–145)

## 2012-03-01 LAB — LIPID PANEL
HDL: 34.4 mg/dL — ABNORMAL LOW (ref >39.00)
Total CHOL/HDL Ratio: 6
VLDL: 64.2 mg/dL — ABNORMAL HIGH (ref 0.0–40.0)

## 2012-03-01 LAB — HEPATIC FUNCTION PANEL
ALT: 19 U/L (ref 0–53)
Bilirubin, Direct: 0.1 mg/dL (ref 0.0–0.3)
Total Bilirubin: 0.8 mg/dL (ref 0.3–1.2)

## 2012-03-01 LAB — CBC WITH DIFFERENTIAL/PLATELET
Basophils Absolute: 0 10*3/uL (ref 0.0–0.1)
Eosinophils Relative: 8.8 % — ABNORMAL HIGH (ref 0.0–5.0)
HCT: 47.5 % (ref 39.0–52.0)
Lymphocytes Relative: 24.4 % (ref 12.0–46.0)
Monocytes Relative: 15.1 % — ABNORMAL HIGH (ref 3.0–12.0)
Neutrophils Relative %: 51 % (ref 43.0–77.0)
Platelets: 162 10*3/uL (ref 150.0–400.0)
RDW: 17.8 % — ABNORMAL HIGH (ref 11.5–14.6)
WBC: 6.4 10*3/uL (ref 4.5–10.5)

## 2012-03-01 LAB — LDL CHOLESTEROL, DIRECT: Direct LDL: 109.2 mg/dL

## 2012-03-01 NOTE — Progress Notes (Signed)
Subjective:    Patient ID: [name], male    DOB: 10/04/1932, 78 y.o.   MRN: [id number]  HPI  Here for medicare wellness  Diet: heart healthy  Physical activity: sedentary Depression/mood screen: negative Hearing: intact to whispered voice Visual acuity: grossly normal, performs annual eye exam  ADLs: capable Fall risk: none Home safety: good Cognitive evaluation: intact to orientation, naming, recall and repetition EOL planning: adv directives, full code/ I agree  I have personally reviewed and have noted 1. The patient's medical and social history 2. Their use of alcohol, tobacco or illicit drugs 3. Their current medications and supplements 4. The patient's functional ability including ADL's, fall risks, home safety risks and hearing or visual impairment. 5. Diet and physical activities 6. Evidence for depression or mood disorders  Also reviewed chronic medical issues: -  Hx DM2, steroid induced - never on meds for same - no sterids since 2011, does not check home cbgs  CHF - denies edema or change in chronic dyspnea on exertion - follows with cards q30mo - the patient reports compliance with medication(s) as prescribed. Denies adverse side effects.  Severe gout - no longer working with rheum "since off shots" - no recent flares of joint pain  OSA - compliant with CPAP qhs - follows with pulm/sleep q79mo - no change in mask or daytime fatigue   Lumbar spinal stenosis - MRI 01/2012 L spine 01/2012 reviewed - working with ortho spine on same (cohen/saullo) -manifest with leg weakness during any activity, also associated with chronic LBP for years, no acute or new change. No leg swelling or joint swelling. No pain in legs with activity.  Past Medical History  Diagnosis Date  . Gout, unspecified     severe dz  . HYPERLIPIDEMIA     intolerant of statins  . OBESITY   . Anxiety state, unspecified   . RESTLESS LEG SYNDROME   . DETACHMENT, RETINAL NOS 1994  . CATARACT  NOS 2008  . HYPERTENSION   . MYOCARDIAL INFARCTION 2004, 03/2010  . CORONARY ARTERY DISEASE   . PERICARDITIS, ACUTE NEC 2007    MSSA, s/p pericardial window  . CHF   . GERD   . DEGENERATIVE JOINT DISEASE   . CAROTID ENDARTERECTOMY, LEFT, HX OF   . INSOMNIA, CHRONIC   . DEPRESSION   . OBSTRUCTIVE SLEEP APNEA     CPAP qhs  . TIA (transient ischemic attack) 2005  . BPH (benign prostatic hypertrophy)   . Lumbar spinal stenosis    Family History  Problem Relation Age of Onset  . Heart disease Mother   . Diabetes Father   . Heart disease Father   . Breast cancer Sister   . Prostate cancer Brother   . Lung cancer Brother    History  Substance Use Topics  . Smoking status: Never Smoker   . Smokeless tobacco: Never Used   Comment: Pt is widowed and lives alone. Pt has children. Retired from [employer]  . Alcohol Use: No    Review of Systems  Constitutional: Negative for fever and fatigue.  Cardiovascular: Negative for chest pain and leg swelling.  Psychiatric/Behavioral: Negative for hallucinations, confusion, dysphoric mood and decreased concentration. The patient is not nervous/anxious.   Gastrointestinal: Negative for abdominal pain, no bowel changes.  Musculoskeletal: Negative for gait problem or joint swelling.  Skin: Negative for rash.  Neurological: Negative for dizziness or headache.  No other specific complaints in a complete review of systems (except as  listed in HPI above).     Objective:   Physical Exam  BP 122/60  Pulse 65  Temp 97.6 F (36.4 C) (Oral)  Ht 5\' 9"  (1.753 m)  Wt 237 lb (107.502 kg)  BMI 35.00 kg/m2  SpO2 98% Wt Readings from Last 3 Encounters:  03/01/12 237 lb (107.502 kg)  12/20/11 228 lb 12.8 oz (103.783 kg)  11/28/11 227 lb 6.4 oz (103.148 kg)   Constitutional:  He is overweight, appears well-developed and well-nourished. No distress.  HENT: NCAT, sinus non tender to palpation, nares clear, OP with erythema and thick PND,  no exudate Eyes:PERRL, EOMI - vision grossly intact, no conjunctivitis Cardiovascular: Normal rate, regular rhythm and normal heart sounds.  3/6 systolic murmur heard. no BLE edema Pulmonary/Chest: Effort normal and breath sounds normal. No respiratory distress. no wheezes.  Abd: SNTND, +BS, no mass Musculoskeletal: Mild tophi on B knees R>L and UE - Hands with OA changes, no acute gouty flare. Back: full range of motion of thoracic and lumbar spine. Non tender to palpation. Negative straight leg raise. DTR's are symmetrically intact. Sensation intact in all dermatomes of the lower extremities. Slightly diminished B quad strength to manual muscle testing. patient is not able to heel toe walk without difficulty and ambulates with bend forward, antalgic gait. Skin: AKs and mild diffuse bruising on extremities. Skin is warm and dry.  No erythema or ulceration.  Psychiatric: he has a normal mood and affect. behavior is normal. Judgment and thought content normal.   Lab Results  Component Value Date   WBC 11.1 05/20/2010   HGB 15.0 05/20/2010   HCT 47.1 05/20/2010   PLT 226 05/20/2010   GLUCOSE 139 05/20/2010   CHOL  Value: 187        ATP III CLASSIFICATION:  <200     mg/dL   Desirable  469-629  mg/dL   Borderline High  >=528    mg/dL   High        11/04/2438   TRIG 154* 04/14/2010   HDL 41 04/14/2010   LDLDIRECT 141.5 11/01/2009   LDLCALC  Value: 115        Total Cholesterol/HDL:CHD Risk Coronary Heart Disease Risk Table                     Men   Women  1/2 Average Risk   3.4   3.3  Average Risk       5.0   4.4  2 X Average Risk   9.6   7.1  3 X Average Risk  23.4   11.0        Use the calculated Patient Ratio above and the CHD Risk Table to determine the patient's CHD Risk.        ATP III CLASSIFICATION (LDL):  <100     mg/dL   Optimal  102-725  mg/dL   Near or Above                    Optimal  130-159  mg/dL   Borderline  366-440  mg/dL   High  >347     mg/dL   Very High* 11/15/9561   ALT 15 05/20/2010    AST 25 05/20/2010   NA 139 05/20/2010   K 4.1 05/20/2010   CL 90 05/20/2010   CREATININE 2.235 05/20/2010   BUN 78 05/20/2010   CO2 35 05/20/2010   TSH 0.726 04/14/2010   INR 0.96 04/15/2010   HGBA1C  Value: 6.2 (NOTE)                                                                       According to the ADA Clinical Practice Recommendations for 2011, when HbA1c is used as a screening test:   >=6.5%   Diagnostic of Diabetes Mellitus           (if abnormal result  is confirmed)  5.7-6.4%   Increased risk of developing Diabetes Mellitus  References:Diagnosis and Classification of Diabetes Mellitus,Diabetes Care,2011,34(Suppl 1):S62-S69 and Standards of Medical Care in         Diabetes - 2011,Diabetes Care,2011,34  (Suppl 1):S11-S61.* 04/16/2010    MRI L spine 08/2002: IMPRESSION  1) MULTILEVEL SEVERE SPINAL STENOSIS. THE STENOSIS IS MOST SEVERE AT L3-4 AND L4-5 DUE TO A COMBINATION OF SEVERE DISK DEGENERATION, SPURS, AND AT L4-5 SOFT DISK MATERIAL.  2) SEVERE SPINAL STENOSIS AT L5-S1 WITH MARKED ASYMMETRY OF THE SPURS INTO THE LEFT LATERAL  RECESS WITH PROBABLE COMPRESSION OF THE LEFT S1 NERVE ROOT.  3) DIFFUSE SEVERE DEGENERATIVE FACET JOINT ARTHRITIS AT L3-4, L4-5, AND L5-S1 BILATERALLY  MRI L spine 01/2012 as above, little progression/change   ECG: sinus @ 54bpm - freq PACs - LAFB    Assessment & Plan:     AWV/v70.0/CPX - Today patient counseled on age appropriate routine health concerns for screening and prevention, each reviewed and up to date or declined. Immunizations reviewed and up to date or declined. Labs/ECG reviewed. Risk factors for depression reviewed and negative. Hearing function and visual acuity are intact. ADLs screened and addressed as needed. Functional ability and level of safety reviewed and appropriate. Education, counseling and referrals performed based on assessed risks today. Patient provided with a copy of personalized plan for preventive services.

## 2012-03-01 NOTE — Assessment & Plan Note (Signed)
Steroid induced in past - no steroids since 2011 Recheck a1c and consider metformin or other med if elevated Lab Results  Component Value Date   HGBA1C  Value: 6.2 (NOTE)                                                                       According to the ADA Clinical Practice Recommendations for 2011, when HbA1c is used as a screening test:   >=6.5%   Diagnostic of Diabetes Mellitus           (if abnormal result  is confirmed)  5.7-6.4%   Increased risk of developing Diabetes Mellitus  References:Diagnosis and Classification of Diabetes Mellitus,Diabetes Care,2011,34(Suppl 1):S62-S69 and Standards of Medical Care in         Diabetes - 2011,Diabetes Care,2011,34  (Suppl 1):S11-S61.* 04/16/2010

## 2012-03-01 NOTE — Patient Instructions (Addendum)
It was good to see you today. Health Maintenance reviewed - all recommended immunizations and age-appropriate screenings are up-to-date. Test(s) ordered today. Your results will be called to you after review (48-72hours after test completion). If any changes need to be made, you will be notified at that time. Medications reviewed, no changes at this time. Continue working with your other specialists as ongoing for back, gout, heart, sleep apnea Please schedule followup in 3-4 months, call sooner if problems. Health Maintenance, Males A healthy lifestyle and preventative care can promote health and wellness.  Maintain regular health, dental, and eye exams.   Eat a healthy diet. Foods like vegetables, fruits, whole grains, low-fat dairy products, and lean protein foods contain the nutrients you need without too many calories. Decrease your intake of foods high in solid fats, added sugars, and salt. Get information about a proper diet from your caregiver, if necessary.   Regular physical exercise is one of the most important things you can do for your health. Most adults should get at least 150 minutes of moderate-intensity exercise (any activity that increases your heart rate and causes you to sweat) each week. In addition, most adults need muscle-strengthening exercises on 2 or more days a week.     Maintain a healthy weight. The body mass index (BMI) is a screening tool to identify possible weight problems. It provides an estimate of body fat based on height and weight. Your caregiver can help determine your BMI, and can help you achieve or maintain a healthy weight. For adults 20 years and older:   A BMI below 18.5 is considered underweight.   A BMI of 18.5 to 24.9 is normal.   A BMI of 25 to 29.9 is considered overweight.   A BMI of 30 and above is considered obese.   Maintain normal blood lipids and cholesterol by exercising and minimizing your intake of saturated fat. Eat a balanced diet  with plenty of fruits and vegetables. Blood tests for lipids and cholesterol should begin at age 68 and be repeated every 5 years. If your lipid or cholesterol levels are high, you are over 50, or you are a high risk for heart disease, you may need your cholesterol levels checked more frequently. Ongoing high lipid and cholesterol levels should be treated with medicines, if diet and exercise are not effective.   If you smoke, find out from your caregiver how to quit. If you do not use tobacco, do not start.   If you choose to drink alcohol, do not exceed 2 drinks per day. One drink is considered to be 12 ounces (355 mL) of beer, 5 ounces (148 mL) of wine, or 1.5 ounces (44 mL) of liquor.   Avoid use of street drugs. Do not share needles with anyone. Ask for help if you need support or instructions about stopping the use of drugs.   High blood pressure causes heart disease and increases the risk of stroke. Blood pressure should be checked at least every 1 to 2 years. Ongoing high blood pressure should be treated with medicines if weight loss and exercise are not effective.   If you are 71 to 76 years old, ask your caregiver if you should take aspirin to prevent heart disease.   Diabetes screening involves taking a blood sample to check your fasting blood sugar level. This should be done once every 3 years, after age 37, if you are within normal weight and without risk factors for diabetes. Testing should be considered  at a younger age or be carried out more frequently if you are overweight and have at least 1 risk factor for diabetes.   Colorectal cancer can be detected and often prevented. Most routine colorectal cancer screening begins at the age of 57 and continues through age 27. However, your caregiver may recommend screening at an earlier age if you have risk factors for colon cancer. On a yearly basis, your caregiver may provide home test kits to check for hidden blood in the stool. Use of a  small camera at the end of a tube, to directly examine the colon (sigmoidoscopy or colonoscopy), can detect the earliest forms of colorectal cancer. Talk to your caregiver about this at age 4, when routine screening begins. Direct examination of the colon should be repeated every 5 to 10 years through age 78, unless early forms of pre-cancerous polyps or small growths are found.   Hepatitis C blood testing is recommended for all people born from [birth year] through 1963 and any individual with known risks for hepatitis C.   Healthy men should no longer receive prostate-specific antigen (PSA) blood tests as part of routine cancer screening. Consult with your caregiver about prostate cancer screening.   Testicular cancer screening is not recommended for adolescents or adult males who have no symptoms. Screening includes self-exam, caregiver exam, and other screening tests. Consult with your caregiver about any symptoms you have or any concerns you have about testicular cancer.   Practice safe sex. Use condoms and avoid high-risk sexual practices to reduce the spread of sexually transmitted infections (STIs).   Use sunscreen with a sun protection factor (SPF) of 30 or greater. Apply sunscreen liberally and repeatedly throughout the day. You should seek shade when your shadow is shorter than you. Protect yourself by wearing long sleeves, pants, a wide-brimmed hat, and sunglasses year round, whenever you are outdoors.   Notify your caregiver of new moles or changes in moles, especially if there is a change in shape or color. Also notify your caregiver if a mole is larger than the size of a pencil eraser.   A one-time screening for abdominal aortic aneurysm (AAA) and surgical repair of large AAAs by sound wave imaging (ultrasonography) is recommended for ages 73 to 37 years who are current or former smokers.   Stay current with your immunizations.  Document Released: 01/06/2008 Document Revised: 06/29/2011  Document Reviewed: 12/05/2010 [facility] Patient Information 2012 [place], [place].

## 2012-03-01 NOTE — Assessment & Plan Note (Signed)
Wt Readings from Last 3 Encounters:  03/01/12 237 lb (107.502 kg)  12/20/11 228 lb 12.8 oz (103.783 kg)  11/28/11 227 lb 6.4 oz (103.148 kg)   Weight trend reviewed The patient is asked to make an attempt to improve diet and exercise patterns to aid in medical management of this problem. Including OSA, DDD and DM risk

## 2012-03-01 NOTE — Assessment & Plan Note (Signed)
Suspect AS, follows with cards for same - No copy of recent echo on file but pt reports "no need for sugery yet" No clear symptoms related to valve dz - chronic dyspnea on exertion without change The current medical regimen is effective;  continue present plan and medications.

## 2012-03-01 NOTE — Assessment & Plan Note (Signed)
Intol of atorva - no statin therapy Recheck lipids now to monitor and forward cards for review

## 2012-03-05 ENCOUNTER — Other Ambulatory Visit: Payer: Self-pay | Admitting: *Deleted

## 2012-03-05 MED ORDER — PREGABALIN 100 MG PO CAPS: 100.0000 mg | ORAL_CAPSULE | Freq: Two times a day (BID) | ORAL | Status: DC

## 2012-03-05 NOTE — Telephone Encounter (Signed)
[facility]ed [facility]... 03/05/12@9 :33am/LMB

## 2012-04-01 ENCOUNTER — Other Ambulatory Visit: Payer: Self-pay | Admitting: *Deleted

## 2012-04-01 MED ORDER — POTASSIUM CHLORIDE CRYS ER 20 MEQ PO TBCR: 20.0000 meq | EXTENDED_RELEASE_TABLET | Freq: Two times a day (BID) | ORAL | Status: DC

## 2012-04-03 ENCOUNTER — Other Ambulatory Visit: Payer: Self-pay | Admitting: Cardiology

## 2012-04-03 DIAGNOSIS — I10 Essential (primary) hypertension: Secondary | ICD-10-CM

## 2012-04-03 DIAGNOSIS — F329 Major depressive disorder, single episode, unspecified: Secondary | ICD-10-CM

## 2012-04-03 DIAGNOSIS — G2581 Restless legs syndrome: Secondary | ICD-10-CM

## 2012-04-03 DIAGNOSIS — F3289 Other specified depressive episodes: Secondary | ICD-10-CM

## 2012-04-03 DIAGNOSIS — K219 Gastro-esophageal reflux disease without esophagitis: Secondary | ICD-10-CM

## 2012-04-03 DIAGNOSIS — M1990 Unspecified osteoarthritis, unspecified site: Secondary | ICD-10-CM

## 2012-04-03 DIAGNOSIS — M1A9XX1 Chronic gout, unspecified, with tophus (tophi): Secondary | ICD-10-CM

## 2012-04-03 DIAGNOSIS — M519 Unspecified thoracic, thoracolumbar and lumbosacral intervertebral disc disorder: Secondary | ICD-10-CM

## 2012-04-03 DIAGNOSIS — F411 Generalized anxiety disorder: Secondary | ICD-10-CM

## 2012-04-03 DIAGNOSIS — Z9889 Other specified postprocedural states: Secondary | ICD-10-CM

## 2012-04-03 DIAGNOSIS — I2510 Atherosclerotic heart disease of native coronary artery without angina pectoris: Secondary | ICD-10-CM

## 2012-04-03 DIAGNOSIS — I6350 Cerebral infarction due to unspecified occlusion or stenosis of unspecified cerebral artery: Secondary | ICD-10-CM

## 2012-04-03 DIAGNOSIS — I359 Nonrheumatic aortic valve disorder, unspecified: Secondary | ICD-10-CM

## 2012-04-03 DIAGNOSIS — E119 Type 2 diabetes mellitus without complications: Secondary | ICD-10-CM

## 2012-04-03 DIAGNOSIS — E785 Hyperlipidemia, unspecified: Secondary | ICD-10-CM

## 2012-04-03 DIAGNOSIS — E669 Obesity, unspecified: Secondary | ICD-10-CM

## 2012-04-03 NOTE — Progress Notes (Signed)
[name]  Date of visit:  04/03/2012 DOB:  09-Apr-1933    Age:  79 yrs. Medical record number:  [id number]     Account number:  [id number] Primary Care Provider: [name] ANN ____________________________ CURRENT DIAGNOSES  1. CAD,Native  2. Stent Placement (drug eluting)  3. Aortic Valve Disorder  4. Obesity(BMI30-40)  5. Hypertensive Heart Disease-Benign without CHF  6. Sleep Apnea  7. Restless Legs Syndrome (rls)  8. Diabetes Mellitus-NIDD  9. Hyperlipidemia  10. Gouty Arthropathy Unspecified  11. Atherosclerosis-Carotid Artery  12. Personal history of TIA or stroke without residua ____________________________ ALLERGIES  Lipitor, Intolerance-myalgia  Uloric, Rash  Vytorin 10-10, Myalgia ____________________________ MEDICATIONS  1. aspirin 81 mg tablet, effervescent, 1 p.o. daily  2. Requip 4 mg Tablet, 1 p.o. daily  3. Vitamin D 1,000 unit Capsule, 1 p.o. daily  4. Plavix 75 mg Tablet, 1 p.o. daily  5. oxybutynin chloride 5 mg Tablet, BID  6. oxycodone 5 mg Capsule, PRN  7. mirtazapine 15 mg Tablet, Rapid Dissolve, 1 p.o. daily  8. metoprolol tartrate 50 mg Tablet, BID  9. furosemide 40 mg Tablet, BID  10. Klor-Con M20 20 mEq Tablet, ER Particles/Crystals, BID  11. lorazepam 1 mg Tablet, 1 p.o. daily  12. pantoprazole 40 mg Recon Soln, 1 p.o. daily  13. Vitamin B-12 1,000 mcg Tablet, 1 p.o. daily  14. Wellbutrin SR 150 mg tablet extended release, 1 p.o. daily  15. allopurinol 300 mg tablet, 1 p.o. daily  16. ferrous gluconate 256 mg (28 mg iron) tablet, 1 p.o. daily  17. magnesium 250 mg tablet, 1 p.o. daily  18. Fish Oil 1,000 mg capsule, 1 p.o. daily  19. Lyrica 100 mg capsule, BID  20. tamsulosin 0.4 mg capsule,extended release 24hr, 1 p.o. daily ____________________________ CHIEF COMPLAINTS  To hve back surgery ____________________________ HISTORY OF PRESENT ILLNESS  Patient seen for preoperative cardiac evaluation. He has developed severe low back  pain and spinal stenosis and is in need of surgery. 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 He has a previous history of a drug-eluting stent placed to the circumflex in July 2010. He has not had recurrent angina since then. He has had a prior stroke in the past. He denies PND, orthopnea, syncope, or claudication. He has a minimal amount of edema. He also has a history of mild to moderate aortic stenosis, but this has been stable and a recent ECHO was fine.  ____________________________ PAST HISTORY  Past Medical Illnesses:  hypertension, hyperlipidemia, obesity, gout, TIA, restless legs, BPH, right brain CVA, osteoarthritis, sleep apnea;  Cardiovascular Illnesses:  CAD, staph pericarditis 11/07 with window, history of  carotid atery disease;  Surgical Procedures:  cataract extraction OU, detached retina, carpal tunnel release, shoulder repair-rt, nasal surg, carotid endarterectomy-left;  NYHA Classification:  II;  Cardiology Procedures-Invasive:  pericardial window 06/2006, cardiac cath (left) July 2010, Xience DES stent  circumflex  July 2010  Dr. Excell Seltzer, cardiac cath (left) September 2011;  Cardiology Procedures-Noninvasive:  regadenoson thallium July 2010, echocardiogram May 2013;  Cardiac Cath Results:  normal Left main, 20 % stenosis proximal LAD, 40% stenosis mid LAD, 70% stenosis proximal Diag 1, 90% stenosis prox CFX, 90% first OM, small and nondominant RCA;  LVEF of 70% documented via cardiac cath on 02/11/2009 ____________________________ CARDIO-PULMONARY TEST DATES EKG Date:  04/03/2012;   Cardiac Cath Date:  04/15/2010;  Stent Placement Date: 02/11/2009;  Nuclear Study Date:  02/04/2009;  Echocardiography Date: 12/13/2011;  Chest Xray Date: 04/11/2010;   ____________________________ SOCIAL HISTORY Alcohol Use:  no alcohol use;  Smoking:  never smoked;  Diet:  regular diet;  Lifestyle:  widower;  Exercise:  no regular exercise;  Occupation:   retired and [employer];  Residence:  lives with daughter;   ____________________________ REVIEW OF SYSTEMS General:  obesity  Integumentary:  easy bruisability  Eyes:  history of retinal detachment, cataract extraction O.U.  Respiratory:  mild dyspnea with exertion  Cardiovascular:  please review HPI  Abdominal:  constipation Genitourinary-Male:  nocturia, erectile dysfunction  Musculoskeletal:  arthritis of the shoulder, arthritis of the neck, restless legs, chronic low back pain  Neurological:  left arm weakness ____________________________ PHYSICAL EXAMINATION VITAL SIGNS  Blood Pressure:  134/70 Sitting, Left arm, regular cuff  , 128/72 Standing, Left arm and regular cuff   Pulse:  80/min. Weight:  237.00 lbs. Height:  70"BMI: 34  Constitutional:  pleasant white male in no acute distress, moderately obese Skin:  scattered hemangiomas, multiple seborrhic keratosis Head:  normocephalic, normal hair pattern, no masses or tenderness ENT:  rosacea, several missing teeth Neck:  supple, no masses, thyromegaly, JVD. Carotid pulses are full and equal bilaterally without bruits., healed left carotid endarterectomy scar Chest:  normal symmetry, clear to auscultation and percussion. Cardiac:  regular rhythm, normal S1 and S2, no S3 or S4, grade 2/6 systolic murmur aortic area  Peripheral Pulses:  the femoral,dorsalis pedis, and posterior tibial pulses are full and equal bilaterally with no bruits auscultated. Extremities & Back:  1+ edema, large tophaceous knot on right knee and elbows Neurological:  weakness left hand ____________________________ MOST RECENT LIPID PANEL 07/26/11  CHOL TOTL 196 mg/dl, LDL 96 calc, HDL 31 mg/dl, TRIGLYCER 846 mg/dl and CHOL/HDL 6.3 (Calc) ____________________________ IMPRESSIONS/PLAN  1. From a cardiovascular perspective he may undergo the planned back surgery. His cardiac risk is slightly increased due to his previous vascular history that he is  stable and may undergo the surgery and anesthesia. The surgery should be done in a hospital setting. He may discontinue his Plavix 7 days prior to surgery and restart when the surgeon deems it to be safe from a neurosurgical viewpoint. 2. Coronary artery disease with previous drug-eluting stent 3. Obesity 4. Hypertension currently controlled 5. Hyperlipidemia under treatment 6. History of tophaceous gout ____________________________ TODAYS ORDERS  1. 12 Lead EKG: Today                       ____________________________ Cardiology Physician:  Darden Palmer MD [facility]

## 2012-04-30 ENCOUNTER — Other Ambulatory Visit: Payer: Self-pay | Admitting: *Deleted

## 2012-04-30 MED ORDER — PREGABALIN 100 MG PO CAPS: 100.0000 mg | ORAL_CAPSULE | Freq: Two times a day (BID) | ORAL | Status: DC

## 2012-04-30 NOTE — Telephone Encounter (Signed)
ok 

## 2012-04-30 NOTE — Telephone Encounter (Signed)
Rx faxed to [facility].

## 2012-04-30 NOTE — Telephone Encounter (Signed)
R'cd fax from [facility] for refill of Lyrica-Last written 03/05/2012 #[phone] with 0 refills to [facility]-please advise.

## 2012-05-24 HISTORY — PX: LUMBAR SPINE SURGERY: SHX701

## 2012-06-04 ENCOUNTER — Ambulatory Visit: Payer: Medicare Other | Admitting: Internal Medicine

## 2012-06-04 DIAGNOSIS — Z0289 Encounter for other administrative examinations: Secondary | ICD-10-CM

## 2012-06-10 ENCOUNTER — Other Ambulatory Visit: Payer: Self-pay | Admitting: *Deleted

## 2012-06-10 MED ORDER — CLOPIDOGREL BISULFATE 75 MG PO TABS: 75.0000 mg | ORAL_TABLET | Freq: Every day | ORAL | Status: DC

## 2012-06-10 MED ORDER — POTASSIUM CHLORIDE CRYS ER 20 MEQ PO TBCR: 20.0000 meq | EXTENDED_RELEASE_TABLET | Freq: Two times a day (BID) | ORAL | Status: DC

## 2012-06-10 MED ORDER — ROPINIROLE HCL 4 MG PO TABS: 4.0000 mg | ORAL_TABLET | Freq: Every day | ORAL | Status: DC

## 2012-06-11 ENCOUNTER — Other Ambulatory Visit: Payer: Self-pay | Admitting: *Deleted

## 2012-06-11 MED ORDER — METOPROLOL TARTRATE 50 MG PO TABS: 50.0000 mg | ORAL_TABLET | Freq: Two times a day (BID) | ORAL | Status: DC

## 2012-06-11 MED ORDER — BUPROPION HCL ER (XL) 150 MG PO TB24: 150.0000 mg | ORAL_TABLET | Freq: Every day | ORAL | Status: DC

## 2012-06-11 MED ORDER — FUROSEMIDE 40 MG PO TABS: 40.0000 mg | ORAL_TABLET | Freq: Two times a day (BID) | ORAL | Status: DC

## 2012-06-11 MED ORDER — PANTOPRAZOLE SODIUM 40 MG PO TBEC: 40.0000 mg | DELAYED_RELEASE_TABLET | Freq: Every day | ORAL | Status: DC

## 2012-06-14 ENCOUNTER — Encounter: Payer: Self-pay | Admitting: Internal Medicine

## 2012-06-14 ENCOUNTER — Other Ambulatory Visit: Payer: Self-pay | Admitting: *Deleted

## 2012-06-14 ENCOUNTER — Ambulatory Visit (INDEPENDENT_AMBULATORY_CARE_PROVIDER_SITE_OTHER): Payer: Medicare Other | Admitting: Internal Medicine

## 2012-06-14 VITALS — BP 142/70 | HR 68 | Temp 97.0°F | Ht 69.0 in | Wt 237.4 lb

## 2012-06-14 DIAGNOSIS — G2581 Restless legs syndrome: Secondary | ICD-10-CM

## 2012-06-14 DIAGNOSIS — I119 Hypertensive heart disease without heart failure: Secondary | ICD-10-CM

## 2012-06-14 DIAGNOSIS — F411 Generalized anxiety disorder: Secondary | ICD-10-CM

## 2012-06-14 DIAGNOSIS — M519 Unspecified thoracic, thoracolumbar and lumbosacral intervertebral disc disorder: Secondary | ICD-10-CM

## 2012-06-14 MED ORDER — OXYCODONE HCL 5 MG PO CAPS: 5.0000 mg | ORAL_CAPSULE | Freq: Two times a day (BID) | ORAL | Status: DC | PRN

## 2012-06-14 MED ORDER — MIRTAZAPINE 15 MG PO TABS: 15.0000 mg | ORAL_TABLET | Freq: Every day | ORAL | Status: DC

## 2012-06-14 NOTE — Assessment & Plan Note (Signed)
BP Readings from Last 3 Encounters:  06/14/12 142/70  03/01/12 122/60  12/20/11 120/62   The current medical regimen is effective;  continue present plan and medications.  No evidence for CHF symptoms at this time

## 2012-06-14 NOTE — Patient Instructions (Addendum)
It was good to see you today. Medications reviewed and updated, no changes at this time. Refill on medication(s) as discussed today. Your prescription(s) have been given to you to submit to your pharmacy. Please take as directed and contact our office if you believe you are having problem(s) with the medication(s). Please schedule followup in 6 months, call sooner if problems.

## 2012-06-14 NOTE — Assessment & Plan Note (Signed)
S/p spinal stenosis surgery decompression at [facility] 05/2012 - [facility] Same reviewed - continue PT and pain control as rx'd

## 2012-06-14 NOTE — Progress Notes (Signed)
Subjective:    Patient ID: [name], male    DOB: [birth year], 78 y.o.   MRN: [id number]  HPI  Here for follow up -reviewed chronic medical issues: -  Hx DM2, steroid induced - never on meds for same - no steroids since 2011, does not check home cbgs  CHF - denies edema or change in chronic dyspnea on exertion - follows with cards q67mo - the patient reports compliance with medication(s) as prescribed. Denies adverse side effects.  Severe gout - no longer working with rheum "since off shots" - no recent flares of joint pain  OSA - compliant with CPAP qhs - follows with pulm/sleep q78mo - no change in mask or daytime fatigue   Lumbar spinal stenosis - MRI 01/2012 L spine 01/2012 reviewed - working with ortho spine on same (cohen/saullo) -manifest with leg weakness during any activity, also associated with chronic LBP for years, no acute or new change. No leg swelling or joint swelling. Surgery 05/2012 at [facility] for same - reports improving mobility  Past Medical History  Diagnosis Date  . Gout, unspecified     severe dz  . HYPERLIPIDEMIA     intolerant of statins  . OBESITY   . Anxiety state, unspecified   . RESTLESS LEG SYNDROME   . DETACHMENT, RETINAL NOS 1994  . CATARACT NOS 2008  . HYPERTENSION   . MYOCARDIAL INFARCTION 2004, 03/2010  . CORONARY ARTERY DISEASE   . PERICARDITIS, ACUTE NEC 2007    MSSA, s/p pericardial window  . CHF   . GERD   . DEGENERATIVE JOINT DISEASE   . CAROTID ENDARTERECTOMY, LEFT, HX OF   . INSOMNIA, CHRONIC   . DEPRESSION   . OBSTRUCTIVE SLEEP APNEA     CPAP qhs  . TIA (transient ischemic attack) 2005  . BPH (benign prostatic hypertrophy)   . Lumbar spinal stenosis     Review of Systems  Constitutional: Negative for fever and fatigue.  Cardiovascular: Negative for chest pain and leg swelling.  Psychiatric/Behavioral: Negative for hallucinations, confusion, dysphoric mood and decreased concentration. The patient is not  nervous/anxious.        Objective:   Physical Exam  BP 142/70  Pulse 68  Temp 97 F (36.1 C) (Oral)  Ht 5\' 9"  (1.753 m)  Wt 237 lb 6.4 oz (107.684 kg)  BMI 35.06 kg/m2  SpO2 96% Wt Readings from Last 3 Encounters:  06/14/12 237 lb 6.4 oz (107.684 kg)  03/01/12 237 lb (107.502 kg)  12/20/11 228 lb 12.8 oz (103.783 kg)   Constitutional:  He is overweight, appears well-developed and well-nourished. No distress. Cardiovascular: Normal rate, regular rhythm and normal heart sounds.  3/6 systolic murmur heard. no BLE edema Pulmonary/Chest: Effort normal and breath sounds normal. No respiratory distress. no wheezes.  Musculoskeletal: Mild tophi on B knees R>L and UE - Hands with OA changes, no acute gouty flare. Skin: AKs and very mild diffuse bruising on extremities. Skin is warm and dry.  No erythema or ulceration.  Psychiatric: he has a normal mood and affect. behavior is normal. Judgment and thought content normal.   Lab Results  Component Value Date   WBC 6.4 03/01/2012   HGB 15.4 03/01/2012   HCT 47.5 03/01/2012   PLT 162.0 03/01/2012   GLUCOSE 99 03/01/2012   CHOL 194 03/01/2012   TRIG 321.0* 03/01/2012   HDL 34.40* 03/01/2012   LDLDIRECT 109.2 03/01/2012   LDLCALC  Value: 115  Total Cholesterol/HDL:CHD Risk Coronary Heart Disease Risk Table                     Men   Women  1/2 Average Risk   3.4   3.3  Average Risk       5.0   4.4  2 X Average Risk   9.6   7.1  3 X Average Risk  23.4   11.0        Use the calculated Patient Ratio above and the CHD Risk Table to determine the patient's CHD Risk.        ATP III CLASSIFICATION (LDL):  <100     mg/dL   Optimal  161-096  mg/dL   Near or Above                    Optimal  130-159  mg/dL   Borderline  045-409  mg/dL   High  >811     mg/dL   Very High* 04/06/7828   ALT 19 03/01/2012   AST 27 03/01/2012   NA 139 03/01/2012   K 4.8 03/01/2012   CL 100 03/01/2012   CREATININE 1.5 03/01/2012   BUN 28* 03/01/2012   CO2 30 03/01/2012   TSH 2.09 03/01/2012   INR  0.96 04/15/2010   HGBA1C 6.1 03/01/2012    MRI L spine 08/2002: IMPRESSION  1) MULTILEVEL SEVERE SPINAL STENOSIS. THE STENOSIS IS MOST SEVERE AT L3-4 AND L4-5 DUE TO A COMBINATION OF SEVERE DISK DEGENERATION, SPURS, AND AT L4-5 SOFT DISK MATERIAL.  2) SEVERE SPINAL STENOSIS AT L5-S1 WITH MARKED ASYMMETRY OF THE SPURS INTO THE LEFT LATERAL  RECESS WITH PROBABLE COMPRESSION OF THE LEFT S1 NERVE ROOT.  3) DIFFUSE SEVERE DEGENERATIVE FACET JOINT ARTHRITIS AT L3-4, L4-5, AND L5-S1 BILATERALLY  MRI L spine 01/2012 as above, little progression/change       Assessment & Plan:     See problem list. Medications and labs reviewed today.  Time spent with pt today 25 minutes, greater than 50% time spent counseling patient on RLS, depression/anxiety and medication review. Also review of prior records including lumbar surgery last week

## 2012-06-14 NOTE — Assessment & Plan Note (Signed)
Chronic symptoms - improved with Lyrica -also used for gout and other neuropathic pain Continue Requip and iron as well Refill on oxycodone - uses same when sever

## 2012-06-14 NOTE — Telephone Encounter (Signed)
Rcd fax from [facility] for refill of Remeron.

## 2012-06-14 NOTE — Assessment & Plan Note (Signed)
Overlap anxiety and depression Uses chronic BID lorazepam and wellbutrin + remeron qhs The current medical regimen is effective;  continue present plan and medications. Refills provided  

## 2012-06-24 ENCOUNTER — Ambulatory Visit: Payer: Medicare Other | Admitting: Pulmonary Disease

## 2012-07-11 ENCOUNTER — Other Ambulatory Visit: Payer: Self-pay | Admitting: *Deleted

## 2012-07-11 MED ORDER — LORAZEPAM 1 MG PO TABS: 1.0000 mg | ORAL_TABLET | Freq: Three times a day (TID) | ORAL | Status: DC | PRN

## 2012-07-11 NOTE — Telephone Encounter (Signed)
Faxed script back to [facility]..../lmb 

## 2012-07-19 ENCOUNTER — Ambulatory Visit: Payer: Medicare Other | Admitting: Pulmonary Disease

## 2012-07-25 ENCOUNTER — Ambulatory Visit: Payer: Medicare Other | Admitting: Pulmonary Disease

## 2012-08-06 ENCOUNTER — Encounter: Payer: Self-pay | Admitting: Pulmonary Disease

## 2012-08-06 ENCOUNTER — Ambulatory Visit (INDEPENDENT_AMBULATORY_CARE_PROVIDER_SITE_OTHER): Payer: Medicare Other | Admitting: Pulmonary Disease

## 2012-08-06 VITALS — BP 118/62 | HR 63 | Temp 96.6°F | Ht 69.0 in | Wt 245.6 lb

## 2012-08-06 DIAGNOSIS — G4733 Obstructive sleep apnea (adult) (pediatric): Secondary | ICD-10-CM

## 2012-08-06 DIAGNOSIS — Z23 Encounter for immunization: Secondary | ICD-10-CM

## 2012-08-06 NOTE — Progress Notes (Signed)
  Subjective:    Patient ID: [name], male    DOB: [birth year], 79 y.o.   MRN: [id number]  HPI The patient comes in today for followup of his obstructive sleep apnea.  He is trying to wear her CPAP every night, but often awakens with a mask pulled off.  He denies the mask being uncomfortable, and does not have a lot of leaks.  He denies having any issues with pressure.  He recently got new seals for his mask, and has been keeping up with supplies.  He admits that he is a very restless sleeper because of musculoskeletal issues, but is unsure why he pulls the mask off.  At the last visit, I had asked for his pressure to be optimized, but I do not see where we ever received this download.   Review of Systems  Constitutional: Negative for fever and unexpected weight change.  HENT: Positive for trouble swallowing. Negative for ear pain, nosebleeds, congestion, sore throat, rhinorrhea, sneezing, dental problem, postnasal drip and sinus pressure.   Eyes: Negative for redness and itching.  Respiratory: Positive for shortness of breath (worsening problems---with exertion, resting, lying down at night and also eating.). Negative for cough, chest tightness and wheezing.   Cardiovascular: Positive for leg swelling. Negative for palpitations.  Gastrointestinal: Negative for nausea and vomiting.  Genitourinary: Negative for dysuria.  Musculoskeletal: Negative for joint swelling.  Skin: Negative for rash.  Neurological: Negative for headaches.  Hematological: Does not bruise/bleed easily.  Psychiatric/Behavioral: Negative for dysphoric mood. The patient is not nervous/anxious.        Objective:   Physical Exam Overweight male in no acute distress Nose without purulence or discharge noted Neck without lymphadenopathy or thyromegaly Lower extremities with mild edema, no cyanosis Alert and oriented, does not appear to be sleepy, moves all 4 extremities.       Assessment & Plan:

## 2012-08-06 NOTE — Assessment & Plan Note (Signed)
The patient has been wearing the CPAP every night, and has been keeping up with mask changes and seals.  However, he will awaken each night with the mask pulled off, and he is unable to tell me why he does this.  He denies any discomfort with the mask, and does not think the pressure is too high or too low.  I would like to get a download off his current machine to troubleshoot this issue.  It is unclear if we have ever optimize his pressure.  This was ordered at the last visit, but I do not see where we ever received his download.  Also encouraged him to work aggressively on weight loss.

## 2012-08-06 NOTE — Patient Instructions (Addendum)
Will get a download of your cpap machine, and try and see if there is anything causing you to pull off mask.  I will call you with results.  Keep up with mask changes and supplies. Work on [employer] loss Will give you the flu shot today. If doing well, followup with me in one year.

## 2012-08-30 ENCOUNTER — Ambulatory Visit (INDEPENDENT_AMBULATORY_CARE_PROVIDER_SITE_OTHER): Payer: Medicare Other | Admitting: Internal Medicine

## 2012-08-30 ENCOUNTER — Encounter: Payer: Self-pay | Admitting: Internal Medicine

## 2012-08-30 ENCOUNTER — Other Ambulatory Visit (INDEPENDENT_AMBULATORY_CARE_PROVIDER_SITE_OTHER): Payer: Medicare Other

## 2012-08-30 VITALS — BP 120/68 | HR 65 | Temp 98.0°F | Wt 249.8 lb

## 2012-08-30 DIAGNOSIS — I5033 Acute on chronic diastolic (congestive) heart failure: Secondary | ICD-10-CM

## 2012-08-30 DIAGNOSIS — F411 Generalized anxiety disorder: Secondary | ICD-10-CM

## 2012-08-30 LAB — BASIC METABOLIC PANEL
Chloride: 102 mEq/L (ref 96–112)
GFR: 39.79 mL/min — ABNORMAL LOW (ref >60.00)
Potassium: 5 mEq/L (ref 3.5–5.1)
Sodium: 138 mEq/L (ref 135–145)

## 2012-08-30 LAB — CBC WITH DIFFERENTIAL/PLATELET
Basophils Relative: 0.2 % (ref 0.0–3.0)
Eosinophils Relative: 1.2 % (ref 0.0–5.0)
HCT: 42.9 % (ref 39.0–52.0)
Hemoglobin: 14 g/dL (ref 13.0–17.0)
Lymphs Abs: 2.2 10*3/uL (ref 0.7–4.0)
MCV: 88.3 fl (ref 78.0–100.0)
Monocytes Relative: 9.8 % (ref 3.0–12.0)
Neutro Abs: 10.7 10*3/uL — ABNORMAL HIGH (ref 1.4–7.7)
Platelets: 153 10*3/uL (ref 150.0–400.0)
RBC: 4.86 Mil/uL (ref 4.22–5.81)
WBC: 14.5 10*3/uL — ABNORMAL HIGH (ref 4.5–10.5)

## 2012-08-30 LAB — TSH: TSH: 2.42 u[IU]/mL (ref 0.35–5.50)

## 2012-08-30 MED ORDER — LORAZEPAM 1 MG PO TABS: 1.0000 mg | ORAL_TABLET | Freq: Three times a day (TID) | ORAL | Status: DC | PRN

## 2012-08-30 MED ORDER — FUROSEMIDE 40 MG PO TABS: 80.0000 mg | ORAL_TABLET | Freq: Two times a day (BID) | ORAL | Status: DC

## 2012-08-30 NOTE — Assessment & Plan Note (Signed)
Overlap anxiety and depression Uses chronic BID lorazepam and wellbutrin + remeron qhs The current medical regimen is effective;  continue present plan and medications. Refills provided  

## 2012-08-30 NOTE — Progress Notes (Signed)
Subjective:    Patient ID: [name], male    DOB: [birth year], 79 y.o.   MRN: [id number]  HPI  complains of shortness of breath and increase edema Onset 1-2 weeks ago, progressively worse associated with weight gain >10# in 8 weeks Reports 100% compliance with rx meds  Past Medical History  Diagnosis Date  . Gout, unspecified     severe dz  . HYPERLIPIDEMIA     intolerant of statins  . OBESITY   . Anxiety state, unspecified   . RESTLESS LEG SYNDROME   . DETACHMENT, RETINAL NOS 1994  . CATARACT NOS 2008  . HYPERTENSION   . MYOCARDIAL INFARCTION 2004, 03/2010  . CORONARY ARTERY DISEASE   . PERICARDITIS, ACUTE NEC 2007    MSSA, s/p pericardial window  . CHF   . GERD   . DEGENERATIVE JOINT DISEASE   . CAROTID ENDARTERECTOMY, LEFT, HX OF   . INSOMNIA, CHRONIC   . DEPRESSION   . OBSTRUCTIVE SLEEP APNEA     CPAP qhs  . TIA (transient ischemic attack) 2005  . BPH (benign prostatic hypertrophy)   . Lumbar spinal stenosis     Review of Systems  Constitutional: Positive for fatigue. Negative for fever.  Respiratory: Positive for shortness of breath. Negative for chest tightness and wheezing.   Cardiovascular: Positive for leg swelling. Negative for chest pain and palpitations.  Neurological: Negative for dizziness.  Psychiatric/Behavioral: The patient is nervous/anxious.        Objective:   Physical Exam BP 120/68  Pulse 65  Temp 98 F (36.7 C) (Oral)  Wt 249 lb 12.8 oz (113.309 kg)  SpO2 95% Wt Readings from Last 3 Encounters:  08/30/12 249 lb 12.8 oz (113.309 kg)  08/06/12 245 lb 9.6 oz (111.403 kg)  06/14/12 237 lb 6.4 oz (107.684 kg)   Constitutional:  He is obese and dyspneic (mild at rest -baseline) -appears well-developed and well-nourished. No acute distress. dtr at side Neck: Normal range of motion. Tick but neck supple. No JVD present. No thyromegaly present.  Cardiovascular: Normal rate, regular rhythm and normal heart sounds.  No murmur heard.  2+ BLE edema Pulmonary/Chest: Effort normal and breath sounds diminished at bases. No respiratory distress. no wheezes or crackle.  Neurological: he is alert and oriented to person, place, and time. No cranial nerve deficit. Coordination normal.  Skin: Skin is warm and dry.  No erythema or ulceration.  Psychiatric: he has a normal mood and affect. behavior is normal. Judgment and thought content normal.   Lab Results  Component Value Date   WBC 6.4 03/01/2012   HGB 15.4 03/01/2012   HCT 47.5 03/01/2012   PLT 162.0 03/01/2012   GLUCOSE 99 03/01/2012   CHOL 194 03/01/2012   TRIG 321.0* 03/01/2012   HDL 34.40* 03/01/2012   LDLDIRECT 109.2 03/01/2012   LDLCALC  Value: 115        Total Cholesterol/HDL:CHD Risk Coronary Heart Disease Risk Table                     Men   Women  1/2 Average Risk   3.4   3.3  Average Risk       5.0   4.4  2 X Average Risk   9.6   7.1  3 X Average Risk  23.4   11.0        Use the calculated Patient Ratio above and the CHD Risk Table to determine the patient's CHD Risk.  ATP III CLASSIFICATION (LDL):  <100     mg/dL   Optimal  161-096  mg/dL   Near or Above                    Optimal  130-159  mg/dL   Borderline  045-409  mg/dL   High  >811     mg/dL   Very High* 04/06/7828   ALT 19 03/01/2012   AST 27 03/01/2012   NA 139 03/01/2012   K 4.8 03/01/2012   CL 100 03/01/2012   CREATININE 1.5 03/01/2012   BUN 28* 03/01/2012   CO2 30 03/01/2012   TSH 2.09 03/01/2012   INR 0.96 04/15/2010   HGBA1C 6.1 03/01/2012       Assessment & Plan:   A/C CHF - systolic and diastolic component -  Weight increase reviewed with symptomatic dyspnea on exertion and paroxysmal nocturnal dyspnea  Reports 2d echo done at Dr Donnie Aho 2 weeks ago - will send for results and review with cards In meanwhile, increase furosemide to 80mg  bid x 5 days, then resume 40 mg bid Check lytes now and cbc/tsh

## 2012-08-30 NOTE — Patient Instructions (Addendum)
It was good to see you today. Medications reviewed and updated, increase furosemide to 80mg  2x/day x 5 days, then resume 40mg  2x/day as before Refill on medication(s) as discussed today. Your prescription(s) have been submitted to your mail order pharmacy. Please take as directed and contact our office if you believe you are having problem(s) with the medication(s). Test(s) ordered today. Your results will be released to MyChart (or called to you) after review, usually within 72hours after test completion. If any changes need to be made, you will be notified at that same time. We will call you after we talk with Dr York Spaniel office about the echo/ultrasound results Please schedule followup in 3-6 months, call sooner if problems.

## 2012-09-08 ENCOUNTER — Other Ambulatory Visit: Payer: Self-pay | Admitting: Pulmonary Disease

## 2012-09-08 DIAGNOSIS — G4733 Obstructive sleep apnea (adult) (pediatric): Secondary | ICD-10-CM

## 2012-09-13 ENCOUNTER — Ambulatory Visit: Payer: Medicare Other | Admitting: Adult Health

## 2012-09-20 ENCOUNTER — Encounter: Payer: Self-pay | Admitting: Neurosurgery

## 2012-09-23 ENCOUNTER — Ambulatory Visit (INDEPENDENT_AMBULATORY_CARE_PROVIDER_SITE_OTHER): Payer: Medicare Other | Admitting: Internal Medicine

## 2012-09-23 ENCOUNTER — Other Ambulatory Visit (INDEPENDENT_AMBULATORY_CARE_PROVIDER_SITE_OTHER): Payer: Medicare Other

## 2012-09-23 ENCOUNTER — Other Ambulatory Visit: Payer: Medicare Other

## 2012-09-23 ENCOUNTER — Encounter: Payer: Self-pay | Admitting: Internal Medicine

## 2012-09-23 ENCOUNTER — Ambulatory Visit: Payer: Medicare Other | Admitting: Neurosurgery

## 2012-09-23 VITALS — BP 122/62 | HR 61 | Temp 97.3°F | Wt 247.2 lb

## 2012-09-23 DIAGNOSIS — N390 Urinary tract infection, site not specified: Secondary | ICD-10-CM

## 2012-09-23 DIAGNOSIS — N400 Enlarged prostate without lower urinary tract symptoms: Secondary | ICD-10-CM

## 2012-09-23 DIAGNOSIS — I359 Nonrheumatic aortic valve disorder, unspecified: Secondary | ICD-10-CM

## 2012-09-23 LAB — URINALYSIS, ROUTINE W REFLEX MICROSCOPIC
Bilirubin Urine: NEGATIVE
Nitrite: NEGATIVE
Total Protein, Urine: NEGATIVE
Urine Glucose: NEGATIVE
pH: 6 (ref 5.0–8.0)

## 2012-09-23 NOTE — Assessment & Plan Note (Signed)
Follows with uro q70mo - recent hosp 08/2012 at [facility] x 4d for UTI symptoms reviewed On flomax and vesicare The current medical regimen is effective;  continue present plan and medications.

## 2012-09-23 NOTE — Patient Instructions (Signed)
It was good to see you today. We have reviewed your hospital records including labs and tests today Medications reviewed and updated, no changes recommended today Test(s) ordered today. Your results will be released to MyChart (or called to you) after review, usually within 72hours after test completion. If any changes need to be made, you will be notified at that same time. Please keep scheduled followup as planned, call sooner if problems.

## 2012-09-23 NOTE — Progress Notes (Signed)
Subjective:    Patient ID: [name], male    DOB: 03-05-1933, 79 y.o.   MRN: [id number]  HPI  Here for hosp follow up - at [facility] for UTI symptoms x 4 d Completed antibiotics, no urinary complaints or dysuria No recurrent AMS since DC home  Past Medical History  Diagnosis Date  . Gout, unspecified     severe dz  . HYPERLIPIDEMIA     intolerant of statins  . OBESITY   . Anxiety state, unspecified   . RESTLESS LEG SYNDROME   . DETACHMENT, RETINAL NOS 1994  . CATARACT NOS 2008  . HYPERTENSION   . MYOCARDIAL INFARCTION 2004, 03/2010  . CORONARY ARTERY DISEASE   . PERICARDITIS, ACUTE NEC 2007    MSSA, s/p pericardial window  . CHF   . GERD   . DEGENERATIVE JOINT DISEASE   . CAROTID ENDARTERECTOMY, LEFT, HX OF   . INSOMNIA, CHRONIC   . DEPRESSION   . OBSTRUCTIVE SLEEP APNEA     CPAP qhs  . TIA (transient ischemic attack) 2005  . BPH (benign prostatic hypertrophy)   . Lumbar spinal stenosis     Review of Systems  Constitutional: Positive for fatigue. Negative for fever.  Respiratory: Positive for shortness of breath. Negative for chest tightness and wheezing.   Cardiovascular: Positive for leg swelling. Negative for chest pain and palpitations.  Neurological: Negative for dizziness.  Psychiatric/Behavioral: The patient is nervous/anxious.        Objective:   Physical Exam  BP 122/62  Pulse 61  Temp(Src) 97.3 F (36.3 C) (Oral)  Wt 247 lb 3.2 oz (112.129 kg)  BMI 36.49 kg/m2  SpO2 94% Wt Readings from Last 3 Encounters:  09/23/12 247 lb 3.2 oz (112.129 kg)  08/30/12 249 lb 12.8 oz (113.309 kg)  08/06/12 245 lb 9.6 oz (111.403 kg)   Constitutional:  He is obese and dyspneic (mild at rest -baseline) -appears well-developed and well-nourished. No acute distress. Neck: Normal range of motion. Thick but neck supple. No JVD present. No thyromegaly present.  Cardiovascular: Normal rate, regular rhythm and normal heart sounds.  3/6 LUSB murmur heard  without change. 1+ BLE edema Pulmonary/Chest: Effort normal and breath sounds diminished at bases. No respiratory distress. no wheezes or crackle.  Neurological: he is alert and oriented to person, place, and time. No cranial nerve deficit. Coordination normal.  Skin: Skin is warm and dry.  No erythema or ulceration.  Psychiatric: he has a normal mood and affect. behavior is normal. Judgment and thought content normal.   Lab Results  Component Value Date   WBC 14.5* 08/30/2012   HGB 14.0 08/30/2012   HCT 42.9 08/30/2012   PLT 153.0 08/30/2012   GLUCOSE 108* 08/30/2012   CHOL 194 03/01/2012   TRIG 321.0* 03/01/2012   HDL 34.40* 03/01/2012   LDLDIRECT 109.2 03/01/2012   LDLCALC  Value: 115        Total Cholesterol/HDL:CHD Risk Coronary Heart Disease Risk Table                     Men   Women  1/2 Average Risk   3.4   3.3  Average Risk       5.0   4.4  2 X Average Risk   9.6   7.1  3 X Average Risk  23.4   11.0        Use the calculated Patient Ratio above and the CHD Risk Table to determine  the patient's CHD Risk.        ATP III CLASSIFICATION (LDL):  <100     mg/dL   Optimal  960-454  mg/dL   Near or Above                    Optimal  130-159  mg/dL   Borderline  098-119  mg/dL   High  >147     mg/dL   Very High* 03/21/5620   ALT 19 03/01/2012   AST 27 03/01/2012   NA 138 08/30/2012   K 5.0 08/30/2012   CL 102 08/30/2012   CREATININE 1.8* 08/30/2012   BUN 30* 08/30/2012   CO2 28 08/30/2012   TSH 2.42 08/30/2012   INR 0.96 04/15/2010   HGBA1C 6.1 03/01/2012       Assessment & Plan:   See problem list. Medications and labs reviewed today.  UTI - hosp DC summary reviewed - symptoms resolved - check UA today to ensure clear and follow up with uro this week as planned

## 2012-09-23 NOTE — Assessment & Plan Note (Signed)
mild AS, follows with [facility]) for same - follow up echo at [facility] 07/2012 and done [facility] 08/2012 - unchanged, normal LV ef pt reports "no need for sugery yet" No clear symptoms related to valve dz - chronic dyspnea on exertion without change The current medical regimen is effective;  continue present plan and medications.

## 2012-09-30 ENCOUNTER — Other Ambulatory Visit: Payer: Self-pay | Admitting: *Deleted

## 2012-09-30 MED ORDER — PREGABALIN 100 MG PO CAPS: 100.0000 mg | ORAL_CAPSULE | Freq: Two times a day (BID) | ORAL | Status: DC

## 2012-09-30 MED ORDER — CLOPIDOGREL BISULFATE 75 MG PO TABS: 75.0000 mg | ORAL_TABLET | Freq: Every day | ORAL | Status: DC

## 2012-09-30 NOTE — Telephone Encounter (Signed)
[facility]ed [facility]...Raechel Chute

## 2012-10-01 ENCOUNTER — Other Ambulatory Visit: Payer: Self-pay | Admitting: *Deleted

## 2012-10-01 DIAGNOSIS — Z48812 Encounter for surgical aftercare following surgery on the circulatory system: Secondary | ICD-10-CM

## 2012-10-17 ENCOUNTER — Ambulatory Visit: Payer: Medicare Other | Admitting: Neurosurgery

## 2012-10-17 ENCOUNTER — Other Ambulatory Visit: Payer: Medicare Other

## 2012-11-18 ENCOUNTER — Other Ambulatory Visit: Payer: Self-pay | Admitting: *Deleted

## 2012-11-18 MED ORDER — MIRTAZAPINE 15 MG PO TABS: 15.0000 mg | ORAL_TABLET | Freq: Every day | ORAL | Status: DC

## 2012-12-02 ENCOUNTER — Ambulatory Visit (INDEPENDENT_AMBULATORY_CARE_PROVIDER_SITE_OTHER): Payer: Medicare Other | Admitting: Internal Medicine

## 2012-12-02 ENCOUNTER — Encounter: Payer: Self-pay | Admitting: Internal Medicine

## 2012-12-02 VITALS — BP 140/72 | HR 69 | Temp 97.3°F | Wt 239.0 lb

## 2012-12-02 DIAGNOSIS — Z0000 Encounter for general adult medical examination without abnormal findings: Secondary | ICD-10-CM

## 2012-12-02 NOTE — Patient Instructions (Signed)
It was good to see you today. We have reviewed your prior records including labs and tests today Health Maintenance reviewed - all recommended immunizations and age-appropriate screenings are up-to-date. Medications reviewed and updated, no changes recommended at this time. Please schedule followup in 6 months, call sooner if problems.  Health Maintenance, Males A healthy lifestyle and preventative care can promote health and wellness.  Maintain regular health, dental, and eye exams.  Eat a healthy diet. Foods like vegetables, fruits, whole grains, low-fat dairy products, and lean protein foods contain the nutrients you need without too many calories. Decrease your intake of foods high in solid fats, added sugars, and salt. Get information about a proper diet from your caregiver, if necessary.  Regular physical exercise is one of the most important things you can do for your health. Most adults should get at least 150 minutes of moderate-intensity exercise (any activity that increases your heart rate and causes you to sweat) each week. In addition, most adults need muscle-strengthening exercises on 2 or more days a week.   Maintain a healthy weight. The body mass index (BMI) is a screening tool to identify possible weight problems. It provides an estimate of body fat based on height and weight. Your caregiver can help determine your BMI, and can help you achieve or maintain a healthy weight. For adults 20 years and older:  A BMI below 18.5 is considered underweight.  A BMI of 18.5 to 24.9 is normal.  A BMI of 25 to 29.9 is considered overweight.  A BMI of 30 and above is considered obese.  Maintain normal blood lipids and cholesterol by exercising and minimizing your intake of saturated fat. Eat a balanced diet with plenty of fruits and vegetables. Blood tests for lipids and cholesterol should begin at age 20 and be repeated every 5 years. If your lipid or cholesterol levels are high, you  are over 50, or you are a high risk for heart disease, you may need your cholesterol levels checked more frequently.Ongoing high lipid and cholesterol levels should be treated with medicines, if diet and exercise are not effective.  If you smoke, find out from your caregiver how to quit. If you do not use tobacco, do not start.  If you choose to drink alcohol, do not exceed 2 drinks per day. One drink is considered to be 12 ounces (355 mL) of beer, 5 ounces (148 mL) of wine, or 1.5 ounces (44 mL) of liquor.  Avoid use of street drugs. Do not share needles with anyone. Ask for help if you need support or instructions about stopping the use of drugs.  High blood pressure causes heart disease and increases the risk of stroke. Blood pressure should be checked at least every 1 to 2 years. Ongoing high blood pressure should be treated with medicines if weight loss and exercise are not effective.  If you are 78 to 77 years old, ask your caregiver if you should take aspirin to prevent heart disease.  Diabetes screening involves taking a blood sample to check your fasting blood sugar level. This should be done once every 3 years, after age 63, if you are within normal weight and without risk factors for diabetes. Testing should be considered at a younger age or be carried out more frequently if you are overweight and have at least 1 risk factor for diabetes.  Colorectal cancer can be detected and often prevented. Most routine colorectal cancer screening begins at the age of 63 and continues  through age 13. However, your caregiver may recommend screening at an earlier age if you have risk factors for colon cancer. On a yearly basis, your caregiver may provide home test kits to check for hidden blood in the stool. Use of a small camera at the end of a tube, to directly examine the colon (sigmoidoscopy or colonoscopy), can detect the earliest forms of colorectal cancer. Talk to your caregiver about this at age  52, when routine screening begins. Direct examination of the colon should be repeated every 5 to 10 years through age 16, unless early forms of pre-cancerous polyps or small growths are found.  Hepatitis C blood testing is recommended for all people born from [birth year] through 1963 and any individual with known risks for hepatitis C.  Healthy men should no longer receive prostate-specific antigen (PSA) blood tests as part of routine cancer screening. Consult with your caregiver about prostate cancer screening.  Testicular cancer screening is not recommended for adolescents or adult males who have no symptoms. Screening includes self-exam, caregiver exam, and other screening tests. Consult with your caregiver about any symptoms you have or any concerns you have about testicular cancer.  Practice safe sex. Use condoms and avoid high-risk sexual practices to reduce the spread of sexually transmitted infections (STIs).  Use sunscreen with a sun protection factor (SPF) of 30 or greater. Apply sunscreen liberally and repeatedly throughout the day. You should seek shade when your shadow is shorter than you. Protect yourself by wearing long sleeves, pants, a wide-brimmed hat, and sunglasses year round, whenever you are outdoors.  Notify your caregiver of new moles or changes in moles, especially if there is a change in shape or color. Also notify your caregiver if a mole is larger than the size of a pencil eraser.  A one-time screening for abdominal aortic aneurysm (AAA) and surgical repair of large AAAs by sound wave imaging (ultrasonography) is recommended for ages 60 to 27 years who are current or former smokers.  Stay current with your immunizations. Document Released: 01/06/2008 Document Revised: 10/02/2011 Document Reviewed: 12/05/2010 [facility] Patient Information 2013 [place], [place].

## 2012-12-02 NOTE — Progress Notes (Signed)
Subjective:    Patient ID: [name], male    DOB: [birth year], 79 y.o.   MRN: [id number]  HPI   Here for medicare wellness  Diet: heart healthy, diabetic Physical activity: sedentary Depression/mood screen: negative Hearing: intact to whispered voice Visual acuity: grossly normal, performs annual eye exam  ADLs: capable Fall risk: none Home safety: good Cognitive evaluation: intact to orientation, naming, recall and repetition EOL planning: adv directives, full code/ I agree  I have personally reviewed and have noted 1. The patient's medical and social history 2. Their use of alcohol, tobacco or illicit drugs 3. Their current medications and supplements 4. The patient's functional ability including ADL's, fall risks, home safety risks and hearing or visual impairment. 5. Diet and physical activities 6. Evidence for depression or mood disorders  Also reviewed chronic medical issues  Past Medical History  Diagnosis Date  . Gout, unspecified     severe dz  . HYPERLIPIDEMIA     intolerant of statins  . OBESITY   . Anxiety state, unspecified   . RESTLESS LEG SYNDROME   . DETACHMENT, RETINAL NOS 1994  . CATARACT NOS 2008  . HYPERTENSION   . MYOCARDIAL INFARCTION 2004, 03/2010  . CORONARY ARTERY DISEASE   . PERICARDITIS, ACUTE NEC 2007    MSSA, s/p pericardial window  . CHF   . GERD   . DEGENERATIVE JOINT DISEASE   . CAROTID ENDARTERECTOMY, LEFT, HX OF   . INSOMNIA, CHRONIC   . DEPRESSION   . OBSTRUCTIVE SLEEP APNEA     CPAP qhs  . TIA (transient ischemic attack) 2005  . BPH (benign prostatic hypertrophy)   . Lumbar spinal stenosis    Family History  Problem Relation Age of Onset  . Heart disease Mother   . Diabetes Father   . Heart disease Father   . Breast cancer Sister   . Prostate cancer Brother   . Lung cancer Brother    History  Substance Use Topics  . Smoking status: Never Smoker   . Smokeless tobacco: Never Used     Comment: Pt is widowed  and lives alone. Pt has children. Retired from [employer]  . Alcohol Use: No    Review of Systems  Constitutional: Positive for fatigue. Negative for fever.  Respiratory: Positive for shortness of breath (chronic). Negative for chest tightness and wheezing.   Cardiovascular: Positive for leg swelling. Negative for chest pain and palpitations.  Neurological: Negative for dizziness.  Psychiatric/Behavioral: The patient is nervous/anxious.   other systems reviewed and negative except as listed     Objective:   Physical Exam  BP 140/72  Pulse 69  Temp(Src) 97.3 F (36.3 C) (Oral)  Wt 239 lb (108.41 kg)  BMI 35.28 kg/m2  SpO2 95% Wt Readings from Last 3 Encounters:  12/02/12 239 lb (108.41 kg)  09/23/12 247 lb 3.2 oz (112.129 kg)  08/30/12 249 lb 12.8 oz (113.309 kg)   Constitutional:  He is obese and dyspneic (mild at rest -baseline) -appears well-developed and well-nourished. No acute distress. Neck: Normal range of motion. Thick but neck supple. No JVD present. No thyromegaly present.  Cardiovascular: Normal rate, regular rhythm and normal heart sounds.  3/6 LUSB murmur heard without change. trace BLE edema Pulmonary/Chest: Effort normal and breath sounds diminished at bases. No respiratory distress. no wheezes or crackle.  Neurological: he is alert and oriented to person, place, and time. No cranial nerve deficit. Coordination normal.  Skin: Skin is warm and dry.  No erythema or ulceration. chronic venous changes Psychiatric: he has a normal mood and affect. behavior is normal. Judgment and thought content normal.   Lab Results  Component Value Date   WBC 14.5* 08/30/2012   HGB 14.0 08/30/2012   HCT 42.9 08/30/2012   PLT 153.0 08/30/2012   GLUCOSE 108* 08/30/2012   CHOL 194 03/01/2012   TRIG 321.0* 03/01/2012   HDL 34.40* 03/01/2012   LDLDIRECT 109.2 03/01/2012   LDLCALC  Value: 115        Total Cholesterol/HDL:CHD Risk Coronary Heart Disease Risk Table                      Men   Women  1/2 Average Risk   3.4   3.3  Average Risk       5.0   4.4  2 X Average Risk   9.6   7.1  3 X Average Risk  23.4   11.0        Use the calculated Patient Ratio above and the CHD Risk Table to determine the patient's CHD Risk.        ATP III CLASSIFICATION (LDL):  <100     mg/dL   Optimal  454-098  mg/dL   Near or Above                    Optimal  130-159  mg/dL   Borderline  119-147  mg/dL   High  >829     mg/dL   Very High* 5/62/1308   ALT 19 03/01/2012   AST 27 03/01/2012   NA 138 08/30/2012   K 5.0 08/30/2012   CL 102 08/30/2012   CREATININE 1.8* 08/30/2012   BUN 30* 08/30/2012   CO2 28 08/30/2012   TSH 2.42 08/30/2012   INR 0.96 04/15/2010   HGBA1C 6.1 03/01/2012       Assessment & Plan:   AWV/CPX/v70.0 - Today patient counseled on age appropriate routine health concerns for screening and prevention, each reviewed and up to date or declined. Immunizations reviewed and up to date or declined. Prior labs/ECG reviewed. Risk factors for depression reviewed and negative. Hearing function and visual acuity are intact. ADLs screened and addressed as needed. Functional ability and level of safety reviewed and appropriate. Education, counseling and referrals performed based on assessed risks today. Patient provided with a copy of personalized plan for preventive services.  Also see problem list. Medications and labs reviewed today.

## 2012-12-12 ENCOUNTER — Ambulatory Visit: Payer: Medicare Other | Admitting: Internal Medicine

## 2012-12-13 ENCOUNTER — Encounter: Payer: Self-pay | Admitting: Vascular Surgery

## 2012-12-17 ENCOUNTER — Ambulatory Visit: Payer: Medicare Other | Admitting: Vascular Surgery

## 2012-12-17 ENCOUNTER — Other Ambulatory Visit: Payer: Medicare Other

## 2012-12-18 ENCOUNTER — Other Ambulatory Visit: Payer: Self-pay | Admitting: *Deleted

## 2012-12-18 MED ORDER — LORAZEPAM 1 MG PO TABS: 1.0000 mg | ORAL_TABLET | Freq: Three times a day (TID) | ORAL | Status: DC | PRN

## 2012-12-18 NOTE — Telephone Encounter (Signed)
[facility]lmb 

## 2013-01-07 ENCOUNTER — Other Ambulatory Visit: Payer: Self-pay | Admitting: *Deleted

## 2013-01-07 MED ORDER — PANTOPRAZOLE SODIUM 40 MG PO TBEC: 40.0000 mg | DELAYED_RELEASE_TABLET | Freq: Every day | ORAL | Status: DC

## 2013-01-07 MED ORDER — BUPROPION HCL ER (XL) 150 MG PO TB24: 150.0000 mg | ORAL_TABLET | Freq: Every day | ORAL | Status: DC

## 2013-01-07 MED ORDER — FUROSEMIDE 40 MG PO TABS: 80.0000 mg | ORAL_TABLET | Freq: Two times a day (BID) | ORAL | Status: DC

## 2013-01-07 NOTE — Telephone Encounter (Signed)
R'cd fax from [facility] for refill of Ropinirole, Pantoprazole, and Furosemide.

## 2013-01-13 ENCOUNTER — Other Ambulatory Visit: Payer: Self-pay | Admitting: *Deleted

## 2013-01-13 MED ORDER — ROPINIROLE HCL 4 MG PO TABS: 4.0000 mg | ORAL_TABLET | Freq: Every day | ORAL | Status: DC

## 2013-02-04 ENCOUNTER — Encounter: Payer: Self-pay | Admitting: Cardiology

## 2013-02-04 NOTE — Progress Notes (Signed)
Patient ID: [name], male   DOB: 1933-01-24, 79 y.o.   MRN: [id number]   [name], [name]  Date of visit:  02/04/2013 DOB:  1932/12/08    Age:  79 yrs. Medical record number:  [id number]     Account number:  [id number] Primary Care Provider: [name] ANN ____________________________ CURRENT DIAGNOSES  1. CAD,Native  2. Stent Placement (drug eluting)  3. Aortic Valve Disorder  4. Obesity(BMI30-40)  5. Hypertensive Heart Disease-Benign without CHF  6. Sleep Apnea  7. Restless Legs Syndrome (rls)  8. Diabetes Mellitus-NIDD  9. Hyperlipidemia  10. Gouty Arthropathy Unspecified  11. Atherosclerosis-Carotid Artery  12. Personal history of TIA or stroke without residua ____________________________ ALLERGIES  Atorvastatin, Muscle aches  Febuxostat, Rash  Simvastatin, Muscle aches ____________________________ MEDICATIONS  1. aspirin 81 mg tablet, effervescent, 1 p.o. daily  2. Requip 4 mg Tablet, 1 p.o. daily  3. Vitamin D 1,000 unit Capsule, 1 p.o. daily  4. oxycodone 5 mg Capsule, PRN  5. mirtazapine 15 mg Tablet, Rapid Dissolve, 1 p.o. daily  6. metoprolol tartrate 50 mg Tablet, BID  7. furosemide 40 mg Tablet, BID  8. Klor-Con M20 20 mEq Tablet, ER Particles/Crystals, BID  9. lorazepam 1 mg Tablet, 1 p.o. daily  10. pantoprazole 40 mg Recon Soln, 1 p.o. daily  11. Vitamin B-12 1,000 mcg Tablet, 1 p.o. daily  12. Wellbutrin SR 150 mg tablet extended release, 1 p.o. daily  13. allopurinol 300 mg tablet, 1 p.o. daily  14. ferrous gluconate 256 mg (28 mg iron) tablet, 1 p.o. daily  15. magnesium 250 mg tablet, 1 p.o. daily  16. Fish Oil 1,000 mg capsule, 1 p.o. daily  17. Lyrica 100 mg capsule, BID  18. tamsulosin 0.4 mg capsule,extended release 24hr, 1 p.o. daily  19. Vesicare 10 mg tablet, 1 p.o. daily  20. clopidogrel 75 mg tablet, 1 p.o. daily ____________________________ CHIEF COMPLAINTS  Blisters on rt leg  Followup of  CAD,Native ____________________________ HISTORY OF PRESENT ILLNESS  Patient seen for cardiac followup. He continues to work doing [employer]. He continues to have dyspnea with exertion that has been present for some time. He remains overweight. He complains of some mild edema as well as erythema and blister formation of his lower legs at times. He denies PND, orthopnea, or claudication. He requests a handicapped license plate. He has a fairly severe problem with restless legs. Blood pressure is under fair control. ____________________________ PAST HISTORY  Past Medical Illnesses:  hypertension, hyperlipidemia, obesity, gout, TIA, restless legs, BPH, right brain CVA, osteoarthritis, sleep apnea;  Cardiovascular Illnesses:  CAD, staph pericarditis 11/07 with window, history of  carotid atery disease;  Surgical Procedures:  cataract extraction OU, detached retina, carpal tunnel release, shoulder repair-rt, nasal surg, carotid endarterectomy-left;  NYHA Classification:  II;  Cardiology Procedures-Invasive:  pericardial window 06/2006, cardiac cath (left) July 2010, Xience DES stent  circumflex  July 2010  Dr. Excell Seltzer, cardiac cath (left) September 2011;  Cardiology Procedures-Noninvasive:  regadenoson thallium July 2010, echocardiogram May 2013, echocardiogram January 2014;  Cardiac Cath Results:  normal Left Mason, 20 % stenosis proximal LAD, 40% stenosis mid LAD, 70% stenosis proximal Diag 1, 90% stenosis prox CFX, 90% first OM, small and nondominant RCA;  LVEF of 50% documented via echocardiogram on 08/14/2012,   ____________________________ CARDIO-PULMONARY TEST DATES EKG Date:  04/03/2012;   Cardiac Cath Date:  04/15/2010;  Stent Placement Date: 02/11/2009;  Nuclear Study Date:  02/04/2009;  Echocardiography Date: 08/14/2012;  Chest Xray Date:  04/11/2010;   ____________________________ FAMILY HISTORY Brother -- Carcinoma of the pancreas, Deceased Father -- Coronary Artery  Disease, Deceased Mother -- Coronary Artery Disease, Deceased Sister -- Sister alive and well Sister -- Chronic obstructive lung disease, Deceased Sister -- Cancer, Deceased ____________________________ SOCIAL HISTORY Alcohol Use:  no alcohol use;  Smoking:  never smoked;  Diet:  regular diet;  Lifestyle:  widower;  Exercise:  no regular exercise;  Occupation:  retired and [employer];  Residence:  lives with daughter;   ____________________________ REVIEW OF SYSTEMS General:  obesity  Integumentary:easy bruisability Eyes: history of retinal detachment, cataract extraction O.U. Respiratory: mild dyspnea with exertion Cardiovascular:  please review HPI Abdominal: constipation Genitourinary-Male: nocturia, erectile dysfunction  Musculoskeletal:  arthritis of the shoulder, arthritis of the neck, restless legs, chronic low back pain Neurological:  left arm weakness  ____________________________ PHYSICAL EXAMINATION VITAL SIGNS  Blood Pressure:  124/80 Sitting, Right arm, regular cuff   Pulse:  70/min. Weight:  239.00 lbs. Height:  70"BMI: 34  Constitutional:  pleasant white male in no acute distress, moderately obese Skin:  scattered hemangiomas, multiple seborrhic keratosis Head:  normocephalic, normal hair pattern, no masses or tenderness ENT:  rosacea, several missing teeth Neck:  supple, no masses, thyromegaly, JVD. Carotid pulses are full and equal bilaterally without bruits., healed left carotid endarterectomy scar Chest:  normal symmetry, clear to auscultation and percussion. Cardiac:  regular rhythm, normal S1 and S2, no S3 or S4, grade 2/6 systolic murmur left sternal border Peripheral Pulses:  the femoral,dorsalis pedis, and posterior tibial pulses are full and equal bilaterally with no bruits auscultated. Extremities & Back:  1+ edema, large tophaceous knot on right knee and elbows, erythema of anterior right leg with some blisters Neurological:  weakness left  hand ____________________________ MOST RECENT LIPID PANEL 03/01/12  CHOL TOTL 194 mg/dl, LDL 621 calc, HDL 34 mg/dl, TRIGLYCER 308 mg/dl and CHOL/HDL 6 (Calc) ____________________________ IMPRESSIONS/PLAN  1. Coronary artery disease with previous stent placement 2. Dyspnea of the pectoral 3. Mild to moderate aortic stenosis 4. Hypertensive heart disease 5. Obesity with need to lose weight  Recommendations:  Discussed importance of additional weight loss. Continue current medical regimen. Followup in 6 month with echocardiogram at that time to assess aortic stenosis and dyspnea. ____________________________ TODAYS ORDERS  1. Return Visit: 6 months  2. 12 Lead EKG: 6 months  3. 2D, color flow, doppler: 6 months                       ____________________________ Cardiology Physician:  Darden Palmer MD [facility]

## 2013-02-06 ENCOUNTER — Encounter: Payer: Self-pay | Admitting: Internal Medicine

## 2013-02-06 ENCOUNTER — Ambulatory Visit (INDEPENDENT_AMBULATORY_CARE_PROVIDER_SITE_OTHER): Payer: Medicare Other | Admitting: Internal Medicine

## 2013-02-06 ENCOUNTER — Telehealth: Payer: Self-pay | Admitting: *Deleted

## 2013-02-06 ENCOUNTER — Other Ambulatory Visit (INDEPENDENT_AMBULATORY_CARE_PROVIDER_SITE_OTHER): Payer: Medicare Other

## 2013-02-06 VITALS — BP 132/74 | HR 60 | Temp 97.0°F | Wt 245.0 lb

## 2013-02-06 DIAGNOSIS — R609 Edema, unspecified: Secondary | ICD-10-CM

## 2013-02-06 DIAGNOSIS — E1159 Type 2 diabetes mellitus with other circulatory complications: Secondary | ICD-10-CM

## 2013-02-06 DIAGNOSIS — I798 Other disorders of arteries, arterioles and capillaries in diseases classified elsewhere: Secondary | ICD-10-CM

## 2013-02-06 DIAGNOSIS — L570 Actinic keratosis: Secondary | ICD-10-CM

## 2013-02-06 DIAGNOSIS — I119 Hypertensive heart disease without heart failure: Secondary | ICD-10-CM

## 2013-02-06 LAB — CBC WITH DIFFERENTIAL/PLATELET
Basophils Relative: 0.2 % (ref 0.0–3.0)
Eosinophils Relative: 4.6 % (ref 0.0–5.0)
HCT: 49.7 % (ref 39.0–52.0)
Hemoglobin: 16.4 g/dL (ref 13.0–17.0)
MCV: 92.4 fl (ref 78.0–100.0)
Monocytes Absolute: 0.7 10*3/uL (ref 0.1–1.0)
Neutro Abs: 4 10*3/uL (ref 1.4–7.7)
Neutrophils Relative %: 58.1 % (ref 43.0–77.0)
RBC: 5.38 Mil/uL (ref 4.22–5.81)
WBC: 6.9 10*3/uL (ref 4.5–10.5)

## 2013-02-06 LAB — BASIC METABOLIC PANEL
CO2: 31 mEq/L (ref 19–32)
Chloride: 102 mEq/L (ref 96–112)
Creatinine, Ser: 1.7 mg/dL — ABNORMAL HIGH (ref 0.4–1.5)
Potassium: 5.2 mEq/L — ABNORMAL HIGH (ref 3.5–5.1)
Sodium: 140 mEq/L (ref 135–145)

## 2013-02-06 LAB — LIPID PANEL
Total CHOL/HDL Ratio: 7
Triglycerides: 315 mg/dL — ABNORMAL HIGH (ref 0.0–149.0)

## 2013-02-06 LAB — MICROALBUMIN / CREATININE URINE RATIO
Creatinine,U: 46.9 mg/dL
Microalb, Ur: 1 mg/dL (ref 0.0–1.9)

## 2013-02-06 LAB — TSH: TSH: 1.62 u[IU]/mL (ref 0.35–5.50)

## 2013-02-06 LAB — HEMOGLOBIN A1C: Hgb A1c MFr Bld: 6.6 % — ABNORMAL HIGH (ref 4.6–6.5)

## 2013-02-06 NOTE — Telephone Encounter (Signed)
Notified ann with regina response...lmb

## 2013-02-06 NOTE — Assessment & Plan Note (Signed)
Steroid induced in past - no steroids since 2011 Recheck a1c and consider metformin or other med if elevated Lab Results  Component Value Date   HGBA1C 6.1 03/01/2012

## 2013-02-06 NOTE — Telephone Encounter (Signed)
MD gave rx for stocking. Needing to know what compression md want 15/20, 20/30 or 30/40...Raechel Chute

## 2013-02-06 NOTE — Assessment & Plan Note (Signed)
Chronic, "euvolemic" despite edema Continue same diuretics and advise compression stocking/elevation and Na restriction -  rx provided for compression hose, knee hi

## 2013-02-06 NOTE — Progress Notes (Signed)
Subjective:    Patient ID: [name], male    DOB: [birth year], 79 y.o.   MRN: [id number]  HPI  here for followup -reviewed chronic medical issues and interval medical events  Past Medical History  Diagnosis Date  . Gout, unspecified     severe dz  . HYPERLIPIDEMIA     intolerant of statins  . OBESITY   . Anxiety state, unspecified   . RESTLESS LEG SYNDROME   . DETACHMENT, RETINAL NOS 1994  . CATARACT NOS 2008  . HYPERTENSION   . MYOCARDIAL INFARCTION 2004, 03/2010  . CORONARY ARTERY DISEASE   . PERICARDITIS, ACUTE NEC 2007    MSSA, s/p pericardial window  . CHF   . GERD   . DEGENERATIVE JOINT DISEASE   . CAROTID ENDARTERECTOMY, LEFT, HX OF   . INSOMNIA, CHRONIC   . DEPRESSION   . OBSTRUCTIVE SLEEP APNEA     CPAP qhs  . TIA (transient ischemic attack) 2005  . BPH (benign prostatic hypertrophy)   . Lumbar spinal stenosis     Review of Systems  Constitutional: Positive for fatigue. Negative for fever.  Respiratory: Negative for chest tightness, shortness of breath and wheezing.   Cardiovascular: Positive for leg swelling. Negative for chest pain and palpitations.  Neurological: Negative for dizziness.  Psychiatric/Behavioral: Negative for dysphoric mood. The patient is not nervous/anxious.        Objective:   Physical Exam  BP 150/80  Pulse 60  Temp(Src) 97 F (36.1 C) (Oral)  Wt 245 lb (111.131 kg)  BMI 36.16 kg/m2  SpO2 95% Wt Readings from Last 3 Encounters:  02/06/13 245 lb (111.131 kg)  12/02/12 239 lb (108.41 kg)  09/23/12 247 lb 3.2 oz (112.129 kg)   Constitutional:  He is obese and dyspneic (mild at rest -baseline) -appears well-developed and well-nourished. No acute distress. dtr at side Neck: Normal range of motion. supple. No JVD present. No thyromegaly present.  Cardiovascular: Normal rate, regular rhythm and normal heart sounds.  No murmur heard. 1+ BLE edema Pulmonary/Chest: Effort normal and breath sounds diminished at bases. No  respiratory distress. no wheezes or crackle.  Skin: hemosiderin deposits and chronic superficial abrasions B forearms/hands/shins - AK changes -Skin is warm and dry.  No erythema or ulceration.  Psychiatric: he has a normal mood and affect. behavior is normal. Judgment and thought content normal.   Lab Results  Component Value Date   WBC 14.5* 08/30/2012   HGB 14.0 08/30/2012   HCT 42.9 08/30/2012   PLT 153.0 08/30/2012   GLUCOSE 108* 08/30/2012   CHOL 194 03/01/2012   TRIG 321.0* 03/01/2012   HDL 34.40* 03/01/2012   LDLDIRECT 109.2 03/01/2012   LDLCALC  Value: 115        Total Cholesterol/HDL:CHD Risk Coronary Heart Disease Risk Table                     Men   Women  1/2 Average Risk   3.4   3.3  Average Risk       5.0   4.4  2 X Average Risk   9.6   7.1  3 X Average Risk  23.4   11.0        Use the calculated Patient Ratio above and the CHD Risk Table to determine the patient's CHD Risk.        ATP III CLASSIFICATION (LDL):  <100     mg/dL   Optimal  409-811  mg/dL  Near or Above                    Optimal  130-159  mg/dL   Borderline  161-096  mg/dL   High  >045     mg/dL   Very High* 10/30/8117   ALT 19 03/01/2012   AST 27 03/01/2012   NA 138 08/30/2012   K 5.0 08/30/2012   CL 102 08/30/2012   CREATININE 1.8* 08/30/2012   BUN 30* 08/30/2012   CO2 28 08/30/2012   TSH 2.42 08/30/2012   INR 0.96 04/15/2010   HGBA1C 6.1 03/01/2012       Assessment & Plan:   See problem list. Medications and labs reviewed today.  AK - B forearm - pt requests derm eval and tx for same- refer now

## 2013-02-06 NOTE — Assessment & Plan Note (Signed)
BP Readings from Last 3 Encounters:  02/06/13 150/80  12/02/12 140/72  09/23/12 122/62   The current medical regimen is effective;  continue present plan and medications.  No evidence for CHF symptoms at this time

## 2013-02-06 NOTE — Telephone Encounter (Signed)
2030

## 2013-02-06 NOTE — Patient Instructions (Signed)
It was good to see you today. We have reviewed your prior records including labs and tests today Test(s) ordered today. Your results will be released to MyChart (or called to you) after review, usually within 72hours after test completion. If any changes need to be made, you will be notified at that same time. Medications reviewed and updated, no changes recommended at this time. we'll make referral to dermatology . Our office will contact you regarding appointment(s) once made. Please schedule followup in 6 months, call sooner if problems.

## 2013-02-07 NOTE — Addendum Note (Signed)
Addended by: [name] A on: 02/07/2013 08:22 AM   Modules accepted: Orders

## 2013-02-10 ENCOUNTER — Encounter: Payer: Self-pay | Admitting: Vascular Surgery

## 2013-02-11 ENCOUNTER — Ambulatory Visit: Payer: Medicare Other | Admitting: Vascular Surgery

## 2013-02-11 ENCOUNTER — Other Ambulatory Visit: Payer: Medicare Other

## 2013-02-12 ENCOUNTER — Ambulatory Visit: Payer: Medicare Other | Admitting: *Deleted

## 2013-03-10 ENCOUNTER — Other Ambulatory Visit: Payer: Self-pay | Admitting: *Deleted

## 2013-03-10 MED ORDER — METOPROLOL TARTRATE 50 MG PO TABS: 50.0000 mg | ORAL_TABLET | Freq: Two times a day (BID) | ORAL | Status: DC

## 2013-04-21 ENCOUNTER — Encounter: Payer: Self-pay | Admitting: Family

## 2013-04-21 ENCOUNTER — Other Ambulatory Visit: Payer: Self-pay | Admitting: Dermatology

## 2013-04-22 ENCOUNTER — Other Ambulatory Visit (HOSPITAL_COMMUNITY): Payer: Medicare Other

## 2013-04-22 ENCOUNTER — Ambulatory Visit: Payer: Medicare Other | Admitting: Family

## 2013-05-05 ENCOUNTER — Other Ambulatory Visit: Payer: Self-pay

## 2013-05-05 MED ORDER — PREGABALIN 100 MG PO CAPS: 100.0000 mg | ORAL_CAPSULE | Freq: Two times a day (BID) | ORAL | Status: DC

## 2013-05-06 ENCOUNTER — Other Ambulatory Visit: Payer: Self-pay | Admitting: *Deleted

## 2013-05-06 MED ORDER — POTASSIUM CHLORIDE CRYS ER 20 MEQ PO TBCR: 20.0000 meq | EXTENDED_RELEASE_TABLET | Freq: Two times a day (BID) | ORAL | Status: DC

## 2013-05-06 MED ORDER — MIRTAZAPINE 15 MG PO TABS: 15.0000 mg | ORAL_TABLET | Freq: Every day | ORAL | Status: DC

## 2013-05-06 MED ORDER — CLOPIDOGREL BISULFATE 75 MG PO TABS: 75.0000 mg | ORAL_TABLET | Freq: Every day | ORAL | Status: DC

## 2013-05-14 ENCOUNTER — Encounter: Payer: Self-pay | Admitting: Family

## 2013-05-15 ENCOUNTER — Ambulatory Visit (INDEPENDENT_AMBULATORY_CARE_PROVIDER_SITE_OTHER): Payer: Medicare Other | Admitting: Family

## 2013-05-15 ENCOUNTER — Encounter: Payer: Self-pay | Admitting: Family

## 2013-05-15 ENCOUNTER — Ambulatory Visit (HOSPITAL_COMMUNITY)
Admission: RE | Admit: 2013-05-15 | Discharge: 2013-05-15 | Disposition: A | Discharge status: Home / Self Care | Payer: Medicare Other | Source: Ambulatory Visit | Attending: Family | Admitting: Family

## 2013-05-15 DIAGNOSIS — I658 Occlusion and stenosis of other precerebral arteries: Secondary | ICD-10-CM | POA: Insufficient documentation

## 2013-05-15 DIAGNOSIS — Z48812 Encounter for surgical aftercare following surgery on the circulatory system: Secondary | ICD-10-CM

## 2013-05-15 DIAGNOSIS — I6529 Occlusion and stenosis of unspecified carotid artery: Secondary | ICD-10-CM | POA: Insufficient documentation

## 2013-05-15 DIAGNOSIS — M7989 Other specified soft tissue disorders: Secondary | ICD-10-CM

## 2013-05-15 NOTE — Patient Instructions (Signed)
Stroke Prevention Some medical conditions and behaviors are associated with an increased chance of having a stroke. You may prevent a stroke by making healthy choices and managing medical conditions. Reduce your risk of having a stroke by:  Staying physically active. Get at least 30 minutes of activity on most or all days.  Not smoking. It may also be helpful to avoid exposure to secondhand smoke.  Limiting alcohol use. Moderate alcohol use is considered to be:  No more than 2 drinks per day for men.  No more than 1 drink per day for nonpregnant women.  Eating healthy foods.  Include 5 or more servings of fruits and vegetables a day.  Certain diets may be prescribed to address high blood pressure, high cholesterol, diabetes, or obesity.  Managing your cholesterol levels.  A low-saturated fat, low-trans fat, low-cholesterol, and high-fiber diet may control cholesterol levels.  Take any prescribed medicines to control cholesterol as directed by your caregiver.  Managing your diabetes.  A controlled-carbohydrate, controlled-sugar diet is recommended to manage diabetes.  Take any prescribed medicines to control diabetes as directed by your caregiver.  Controlling your high blood pressure (hypertension).  A low-salt (sodium), low-saturated fat, low-trans fat, and low-cholesterol diet is recommended to manage high blood pressure.  Take any prescribed medicines to control hypertension as directed by your caregiver.  Maintaining a healthy weight.  A reduced-calorie, low-sodium, low-saturated fat, low-trans fat, low-cholesterol diet is recommended to manage weight.  Stopping drug abuse.  Avoiding birth control pills.  Talk to your caregiver about the risks of taking birth control pills if you are over 35 years old, smoke, get migraines, or have ever had a blood clot.  Getting evaluated for sleep disorders (sleep apnea).  Talk to your caregiver about getting a sleep evaluation  if you snore a lot or have excessive sleepiness.  Taking medicines as directed by your caregiver.  For some people, aspirin or blood thinners (anticoagulants) are helpful in reducing the risk of forming abnormal blood clots that can lead to stroke. If you have the irregular heart rhythm of atrial fibrillation, you should be on a blood thinner unless there is a good reason you cannot take them.  Understand all your medicine instructions. SEEK IMMEDIATE MEDICAL CARE IF:   You have sudden weakness or numbness of the face, arm, or leg, especially on one side of the body.  You have sudden confusion.  You have trouble speaking (aphasia) or understanding.  You have sudden trouble seeing in one or both eyes.  You have sudden trouble walking.  You have dizziness.  You have a loss of balance or coordination.  You have a sudden, severe headache with no known cause.  You have new chest pain or an irregular heartbeat. Any of these symptoms may represent a serious problem that is an emergency. Do not wait to see if the symptoms will go away. Get medical help right away. Call your local emergency services (911 in [place] Do not drive yourself to the hospital. Document Released: 08/17/2004 Document Revised: 10/02/2011 Document Reviewed: 02/27/2011 [employer] Patient Information 2014 [employer]  

## 2013-05-15 NOTE — Progress Notes (Signed)
Established Carotid Patient  Previous Carotid surgery: Yes Surgeon: Edilia Bo  History of Present Illness  [name] is a 79 y.o. male patient of Dr. Edilia Bo who underwent a left CEA on 12/06/2004, presents today for carotid Duplex follow up. Patient reports he had a "light stroke" just before the CEA as manifested by running into doors for a couple of days, denies vision disturbance, states he might have been weak. Patient denies TIA or stroke symptoms since his CEA. Has never smoked. States he has had 2 "light" MI's, has one cardiac stent, Dr. Donnie Aho is his cardiologist. States he is very physically active. He has some tissue deformity of left forearm which pt. 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 He also has OA deformities of hands.  The patient denies amaurosis fugax or monocular blindness.  The patient  denies facial drooping.  Pt. denies hemiplegia.  The patient denies receptive or expressive aphasia.  Pt. denies extremity weakness. Patient denies claudication symptoms, denies non-healing wounds.  Patient  reports New Medical or Surgical History: UTI treated, hospitalized for 4 days at [facility], and also had lumbar surgery.  Pt Diabetic: No  Pt meds include: Statin : No: not taken for 4-5 years, due to myalgias Betablocker: Yes ASA: Yes Other anticoagulants/antiplatelets: Plavix   Past Medical History  Diagnosis Date  . Gout, unspecified     severe dz  . HYPERLIPIDEMIA     intolerant of statins  . OBESITY   . Anxiety state, unspecified   . RESTLESS LEG SYNDROME   . DETACHMENT, RETINAL NOS 1994  . CATARACT NOS 2008  . HYPERTENSION   . MYOCARDIAL INFARCTION 2004, 03/2010  . CORONARY ARTERY DISEASE   . PERICARDITIS, ACUTE NEC 2007    MSSA, s/p pericardial window  . CHF   . GERD   . DEGENERATIVE JOINT DISEASE   . CAROTID ENDARTERECTOMY, LEFT, HX OF   . INSOMNIA, CHRONIC   . DEPRESSION   . OBSTRUCTIVE SLEEP APNEA     CPAP qhs  . TIA  (transient ischemic attack) 2005  . BPH (benign prostatic hypertrophy)   . Lumbar spinal stenosis   . Carotid artery occlusion   . Stroke May 2007    Social History History  Substance Use Topics  . Smoking status: Never Smoker   . Smokeless tobacco: Never Used     Comment: Pt is widowed and lives alone. Pt has children. Retired from [employer]  . Alcohol Use: No    Family History Family History  Problem Relation Age of Onset  . Heart disease Mother   . Diabetes Father   . Heart disease Father   . Breast cancer Sister   . Cancer Sister   . Prostate cancer Brother   . Lung cancer Brother   . Cancer Brother   . Diabetes Daughter   . Diabetes Son     Surgical History Past Surgical History  Procedure Laterality Date  . Coronary angioplasty  01/2009  . Pericardial window  2007  . Left arm  2008  . Right arm    . Cataract extraction      Left side x's 2 and right  . Retinal detachment surgery      left side  . Shoulder surgery      bilateral  . Left foot     . Eye surgery    . Carotid endarterectomy Left 12-06-05    cea    Allergies  Allergen Reactions  .  Atorvastatin     REACTION: tol simvastatin ok per pt ( Pt. Says ALL the Statins )    Current Outpatient Prescriptions  Medication Sig Dispense Refill  . allopurinol (ZYLOPRIM) 300 MG tablet Take 1 by mouth daily      . aspirin 81 MG tablet Take 81 mg by mouth daily.        Marland Kitchen buPROPion (WELLBUTRIN XL) 150 MG 24 hr tablet Take 1 tablet (150 mg total) by mouth daily.  90 tablet  2  . cholecalciferol (VITAMIN D) 1000 UNITS tablet Take 1,000 Units by mouth daily.        . clopidogrel (PLAVIX) 75 MG tablet Take 1 tablet (75 mg total) by mouth daily.  90 tablet  1  . Ferrous Sulfate (IRON) 28 MG TABS Take by mouth daily.      . furosemide (LASIX) 40 MG tablet Take 2 tablets (80 mg total) by mouth 2 (two) times daily.  360 tablet  2  . LORazepam (ATIVAN) 1 MG tablet Take 1 tablet (1 mg total) by mouth  every 8 (eight) hours as needed for anxiety.  180 tablet  1  . Magnesium 250 MG TABS Take by mouth daily.      . metoprolol (LOPRESSOR) 50 MG tablet Take 1 tablet (50 mg total) by mouth 2 (two) times daily.  180 tablet  1  . mirtazapine (REMERON) 15 MG tablet Take 1 tablet (15 mg total) by mouth at bedtime.  90 tablet  1  . nitroGLYCERIN (NITROSTAT) 0.4 MG SL tablet Place 0.4 mg under the tongue every 5 (five) minutes as needed. For chest pain      . Omega-3 Fatty Acids (FISH OIL) 1200 MG CAPS Take by mouth daily.      Marland Kitchen oxycodone (OXY-IR) 5 MG capsule Take 1 capsule (5 mg total) by mouth every 12 (twelve) hours as needed for pain.  60 capsule  0  . pantoprazole (PROTONIX) 40 MG tablet Take 1 tablet (40 mg total) by mouth daily.  90 tablet  2  . potassium chloride SA (K-DUR,KLOR-CON) 20 MEQ tablet Take 1 tablet (20 mEq total) by mouth 2 (two) times daily.  180 tablet  1  . pregabalin (LYRICA) 100 MG capsule Take 1 capsule (100 mg total) by mouth 2 (two) times daily.  180 capsule  3  . rOPINIRole (REQUIP) 4 MG tablet Take 1 tablet (4 mg total) by mouth at bedtime.  90 tablet  2  . silver sulfADIAZINE (SILVADENE) 1 % cream Apply topically daily.      . solifenacin (VESICARE) 5 MG tablet Take 1 tablet (5 mg total) by mouth daily.      Marland Kitchen sulfamethoxazole-trimethoprim (BACTRIM DS) 800-160 MG per tablet 2 (two) times daily.       . Tamsulosin HCl (FLOMAX) 0.4 MG CAPS Take 1 capsule (0.4 mg total) by mouth daily.  30 capsule  3   No current facility-administered medications for this visit.    Review of Systems : [x]  Positive   [ ]  Denies  General:[ ]  Weight loss,  [ ]  Weight gain, [ ]  Loss of appetite, [ ]  Fever, [ ]  chills  Neurologic: [ ]  Dizziness, [ ]  Blackouts, [ ]  Headaches, [ ]  Seizure [ ]  Stroke, [ ]  "Mini stroke", [ ]  Slurred speech, [ ]  Temporary blindness;  [ ] weakness,  Ear/Nose/Throat: [ ]  Change in hearing, [ ]  Nose bleeds, [ ]  Hoarseness  Vascular:[ ]  Pain in legs with walking, [  ] Pain  in feet while lying flat , [ ]   Non-healing ulcer, [ ]  Blood clot in vein,    Pulmonary: [ ]  Home oxygen, [ ]   Productive cough, [ ]  Bronchitis, [ ]  Coughing up blood,  [ ]  Asthma, [ ]  Wheezing  Musculoskeletal:  [ ]  Arthritis, [ ]  Joint pain, [ ]  low back pain  Cardiac: [ ]  Chest pain, [ ]  Shortness of breath when lying flat, [ ]  Shortness of breath with exertion, [ ]  Palpitations, [ ]  Heart murmur, [ ]   Atrial fibrillation  Hematologic:[ ]  Easy Bruising, [ ]  Anemia; [ ]  Hepatitis  Psychiatric: [ ]   Depression, [ ]  Anxiety   Gastrointestinal: [ ]  Black stool, [ ]  Blood in stool, [ ]  Peptic ulcer disease,  [ ]  Gastroesophageal Reflux, [ ]  Trouble swallowing, [ ]  Diarrhea, [ ]  Constipation  Urinary: [ ]  chronic Kidney disease, [ ]  on HD, [ ]  Burning with urination, [ ]  Frequent urination, [ ]  Difficulty urinating;   Skin: [ ]  Rashes, [ ]  Wounds    Physical Examination  Filed Vitals:   05/15/13 1014  BP: 109/54  Pulse: 50  Resp:    Filed Weights   05/15/13 1008  Weight: 235 lb (106.595 kg)   Body mass index is 34.69 kg/(m^2).  General: WDWN male in NAD GAIT: normal Eyes: Left pupil is larger than right, pt. States due to hx of detached retina in left eye Pulmonary:  CTAB, Negative  Rales, Negative rhonchi, & Negative wheezing.  Cardiac: regular Rhythm ,  Positive Murmurs.  VASCULAR EXAM Carotid Bruits Left Right   Cardiac murmur transmitted Cardiac murmur transmitted    Aorta is not palpable. Radial pulses are 2+ palpable and  equal.                                                                                                                        Gastrointestinal: soft, nontender, BS WNL, no r/g,  negative masses.  Musculoskeletal: Positive muscle atrophy/wasting on left forearm s/p surgical procedure, see HPI.. M/S 5/5 throughout, Extremities without ischemic changes. Hands with moderate OA/gout  deformities.  Neurologic: A&O X 3; Appropriate Affect ;  SENSATION ;normal;  Speech is normal CN 2-12 intact except, Pain and light touch intact in extremities, Motor exam as listed above.   Non-Invasive Vascular Imaging CAROTID DUPLEX 05/15/2013   Right ICA: <40% stenosis of proximal ICA and >50% stenosis of distal CCA/bifurcation. Left ICA: patent CEA site with no restenosis.  These findings are Worse from previous exam.  Assessment: ELIGE SHOUSE is a 79 y.o. male who presents with asymptomatic patent left ICA (CEA site) and >50% stenosis of right distal CCA/bifurcation. The  ICA stenosis is  Worse from previous exam. Brachial pressures are equal. Plan: Follow-up in 6 months with Carotid Duplex scan.   I discussed in depth with the patient the nature of atherosclerosis, and emphasized the importance of maximal medical management including strict control of blood pressure, blood glucose, and  lipid levels, obtaining regular exercise, and continued cessation of smoking.  The patient is aware that without maximal medical management the underlying atherosclerotic disease process will progress, limiting the benefit of any interventions. The patient was given information about stroke prevention and what symptoms should prompt the patient to seek immediate medical care. Thank you for allowing [place] to participate in this patient's care.  Charisse March, RN, MSN, FNP-C [facility] Office: [phone]  Clinic Physician: Darrick Penna  05/15/2013 10:29 AM

## 2013-05-26 ENCOUNTER — Encounter: Payer: Self-pay | Admitting: Internal Medicine

## 2013-05-26 ENCOUNTER — Ambulatory Visit (INDEPENDENT_AMBULATORY_CARE_PROVIDER_SITE_OTHER): Payer: Medicare Other | Admitting: Internal Medicine

## 2013-05-26 VITALS — BP 130/70 | HR 60 | Temp 97.7°F | Wt 239.4 lb

## 2013-05-26 DIAGNOSIS — I119 Hypertensive heart disease without heart failure: Secondary | ICD-10-CM

## 2013-05-26 DIAGNOSIS — L989 Disorder of the skin and subcutaneous tissue, unspecified: Secondary | ICD-10-CM

## 2013-05-26 DIAGNOSIS — I798 Other disorders of arteries, arterioles and capillaries in diseases classified elsewhere: Secondary | ICD-10-CM

## 2013-05-26 DIAGNOSIS — I872 Venous insufficiency (chronic) (peripheral): Secondary | ICD-10-CM

## 2013-05-26 DIAGNOSIS — E1159 Type 2 diabetes mellitus with other circulatory complications: Secondary | ICD-10-CM

## 2013-05-26 NOTE — Progress Notes (Signed)
Pre-visit discussion using our clinic review tool. No additional management support is needed unless otherwise documented below in the visit note.  

## 2013-05-26 NOTE — Patient Instructions (Addendum)
It was good to see you today.  We have reviewed your prior records including labs and tests today  Medications reviewed and updated, no changes recommended at this time.  we'll make referral to vascular specialist Dr Guss Bunde as requested. Our office will contact you regarding appointment(s) once made.  Please keep scheduled followup as planned, call sooner if problems.  Venous Stasis and Chronic Venous Insufficiency As people age, the veins located in their legs may weaken and stretch. When veins weaken and lose the ability to pump blood effectively, the condition is called chronic venous insufficiency (CVI) or venous stasis. Almost all veins return blood back to the heart. This happens by:  The force of the heart pumping fresh blood pushes blood back to the heart.  Blood flowing to the heart from the force of gravity. In the deep veins of the legs, blood has to fight gravity and flow upstream back to the heart. Here, the leg muscles contract to pump blood back toward the heart. Vein walls are elastic, and many veins have small valves that only allow blood to flow in one direction. When leg muscles contract, they push inward against the elastic vein walls. This squeezes blood upward, opens the valves, and moves blood toward the heart. When leg muscles relax, the vein wall also relaxes and the valves inside the vein close to prevent blood from flowing backward. This method of pumping blood out of the legs is called the venous pump. CAUSES  The venous pump works best while walking and leg muscles are contracting. But when a person sits or stands, blood pressure in leg veins can build. Deep veins are usually able to withstand short periods of inactivity, but long periods of inactivity (and increased pressure) can stretch, weaken, and damage vein walls. High blood pressure can also stretch and damage vein walls. The veins may no longer be able to pump blood back to the heart. Venous hypertension  (high blood pressure inside veins) that lasts over time is a primary cause of CVI. CVI can also be caused by:   Deep vein thrombosis, a condition where a thrombus (blood clot) blocks blood flow in a vein.  Phlebitis, an inflammation of a superficial vein that causes a blood clot to form. Other risk factors for CVI may include:   Heredity.  Obesity.  Pregnancy.  Sedentary lifestyle.  Smoking.  Jobs requiring long periods of standing or sitting in one place.  Age and gender:  Women in their 48's and 62's and men in their 54's are more prone to developing CVI. SYMPTOMS  Symptoms of CVI may include:   Varicose veins.  Ulceration or skin breakdown.  Lipodermatosclerosis, a condition that affects the skin just above the ankle, usually on the inside surface. Over time the skin becomes brown, smooth, tight and often painful. Those with this condition have a high risk of developing skin ulcers.  Reddened or discolored skin on the leg.  Swelling. DIAGNOSIS  Your caregiver can diagnose CVI after performing a careful medical history and physical examination. To confirm the diagnosis, the following tests may also be ordered:   Duplex ultrasound.  Plethysmography (tests blood flow).  Venograms (x-ray using a special dye). TREATMENT The goals of treatment for CVI are to restore a person to an active life and to minimize pain or disability. Typically, CVI does not pose a serious threat to life or limb, and with proper treatment most people with this condition can continue to lead active lives. In most  cases, mild CVI can be treated on an outpatient basis with simple procedures. Treatment methods include:   Elastic compression socks.  Sclerotherapy, a procedure involving an injection of a material that "dissolves" the damaged veins. Other veins in the network of blood vessels take over the function of the damaged veins.  Vein stripping (an older procedure less commonly  used).  Laser Ablation surgery.  Valve repair. HOME CARE INSTRUCTIONS   Elastic compression socks must be worn every day. They can help with symptoms and lower the chances of the problem getting worse, but they do not cure the problem.  Only take over-the-counter or prescription medicines for pain, discomfort, or fever as directed by your caregiver.  Your caregiver will review your other medications with you. SEEK MEDICAL CARE IF:   You are confused about how to take your medications.  There is redness, swelling, or increasing pain in the affected area.  There is a red streak or line that extends up or down from the affected area.  There is a breakdown or loss of skin in the affected area, even if the breakdown is small.  You develop an unexplained oral temperature above 102 F (38.9 C).  There is an injury to the affected area. SEEK IMMEDIATE MEDICAL CARE IF:   There is an injury and open wound to the affected area.  Pain is not adequately relieved with pain medication prescribed or becomes severe.  An oral temperature above 102 F (38.9 C) develops.  The foot/ankle below the affected area becomes suddenly numb or the area feels weak and hard to move. MAKE SURE YOU:   Understand these instructions.  Will watch your condition.  Will get help right away if you are not doing well or get worse. Document Released: 11/13/2006 Document Revised: 10/02/2011 Document Reviewed: 01/21/2007 [facility] Patient Information 2014 [place], [place].

## 2013-05-26 NOTE — Progress Notes (Signed)
Subjective:    Patient ID: [name], male    DOB: [birth year], 79 y.o.   MRN: [id number]  HPI  Patient here today for follow up from recent dermatology visit.  Was referred for actinic keratosis.  Dermatology report reviewed today.  Recommendations were for compression hose as well as referral to Dr Guss Bunde for vascular evaluation.  Pt reports blistering on bilateral lower extremities that burst and are subsequent open wounds, painful for patient.  He has been unable to wear compression hose in the past.   Past Medical History  Diagnosis Date  . Gout, unspecified     severe dz  . HYPERLIPIDEMIA     intolerant of statins  . OBESITY   . Anxiety state, unspecified   . RESTLESS LEG SYNDROME   . DETACHMENT, RETINAL NOS 1994  . CATARACT NOS 2008  . HYPERTENSION   . MYOCARDIAL INFARCTION 2004, 03/2010  . CORONARY ARTERY DISEASE   . PERICARDITIS, ACUTE NEC 2007    MSSA, s/p pericardial window  . CHF   . GERD   . DEGENERATIVE JOINT DISEASE   . CAROTID ENDARTERECTOMY, LEFT, HX OF   . INSOMNIA, CHRONIC   . DEPRESSION   . OBSTRUCTIVE SLEEP APNEA     CPAP qhs  . TIA (transient ischemic attack) 2005  . BPH (benign prostatic hypertrophy)   . Lumbar spinal stenosis   . Carotid artery occlusion   . Stroke May 2007     Review of Systems  Constitutional: Negative for fever, chills, activity change and appetite change.  Skin: Positive for wound.       Bilateral lower extremities.        Objective:   Physical Exam  Constitutional: He appears well-developed and well-nourished. No distress.  Cardiovascular: Normal rate and normal heart sounds.   Pulmonary/Chest: Effort normal and breath sounds normal. No respiratory distress. He has no wheezes.  Musculoskeletal:  Chronic tophi changes  Skin: He is not diaphoretic.  Multiple open wounds on bilateral lower extremities. Chronic venous stasis changes. Some blistering with clear fluid.  Skin with rubor.  Temperature normal.   Psychiatric: He has a normal mood and affect. His behavior is normal. Judgment and thought content normal.    Wt Readings from Last 3 Encounters:  05/26/13 239 lb 6.4 oz (108.591 kg)  05/15/13 235 lb (106.595 kg)  02/06/13 245 lb (111.131 kg)   BP Readings from Last 3 Encounters:  05/26/13 130/70  05/15/13 109/54  02/06/13 132/74   Lab Results  Component Value Date   WBC 6.9 02/06/2013   HGB 16.4 02/06/2013   HCT 49.7 02/06/2013   PLT 161.0 02/06/2013   GLUCOSE 141* 02/06/2013   CHOL 207* 02/06/2013   TRIG 315.0* 02/06/2013   HDL 28.70* 02/06/2013   LDLDIRECT 134.4 02/06/2013   LDLCALC  Value: 115        Total Cholesterol/HDL:CHD Risk Coronary Heart Disease Risk Table                     Men   Women  1/2 Average Risk   3.4   3.3  Average Risk       5.0   4.4  2 X Average Risk   9.6   7.1  3 X Average Risk  23.4   11.0        Use the calculated Patient Ratio above and the CHD Risk Table to determine the patient's CHD Risk.        ATP  III CLASSIFICATION (LDL):  <100     mg/dL   Optimal  409-811  mg/dL   Near or Above                    Optimal  130-159  mg/dL   Borderline  914-782  mg/dL   High  >956     mg/dL   Very High* 09/06/863   ALT 19 03/01/2012   AST 27 03/01/2012   NA 140 02/06/2013   K 5.2* 02/06/2013   CL 102 02/06/2013   CREATININE 1.7* 02/06/2013   BUN 30* 02/06/2013   CO2 31 02/06/2013   TSH 1.62 02/06/2013   INR 0.96 04/15/2010   HGBA1C 6.6* 02/06/2013   MICROALBUR 1.0 02/06/2013       Assessment & Plan:   Bilateral lower legs with chronic venous insufficiency and stasis dermatitis. October 2014 dermatology evaluation for same reviewed. Biopsy unable to exclude vasculitis -has been recommended by Dr Hortense Ramal to see a vascular specialist Dr. Guss Bunde so will refer for same as per request. Reminded patient to comply with compression stockings as prescribed and keep open sores covered as needed to prevent sticking. No evidence for cellulitis or active infection today

## 2013-05-26 NOTE — Assessment & Plan Note (Signed)
Steroid induced in past - no steroids since 2011 Recheck a1c q6-12 mo and consider metformin or other med if elevated The patient is asked to make an attempt to improve diet and exercise patterns to aid in medical management of this problem.  Lab Results  Component Value Date   HGBA1C 6.6* 02/06/2013   

## 2013-05-26 NOTE — Assessment & Plan Note (Signed)
BP Readings from Last 3 Encounters:  05/26/13 130/70  05/15/13 109/54  02/06/13 132/74   The current medical regimen is effective;  continue present plan and medications.  No evidence for CHF symptoms at this time

## 2013-05-27 ENCOUNTER — Other Ambulatory Visit: Payer: Self-pay | Admitting: *Deleted

## 2013-05-27 MED ORDER — OXYCODONE HCL 5 MG PO CAPS: 5.0000 mg | ORAL_CAPSULE | Freq: Two times a day (BID) | ORAL | Status: DC | PRN

## 2013-05-27 NOTE — Telephone Encounter (Signed)
Called pt no answer LM[facility] rx ready for pick-up.../lmb 

## 2013-05-27 NOTE — Telephone Encounter (Signed)
Requesting refill on the oxycodone...[name]

## 2013-05-28 ENCOUNTER — Encounter: Payer: Self-pay | Admitting: Vascular Surgery

## 2013-05-28 ENCOUNTER — Other Ambulatory Visit: Payer: Self-pay | Admitting: *Deleted

## 2013-05-28 DIAGNOSIS — L97909 Non-pressure chronic ulcer of unspecified part of unspecified lower leg with unspecified severity: Secondary | ICD-10-CM

## 2013-06-26 ENCOUNTER — Encounter: Payer: Self-pay | Admitting: Internal Medicine

## 2013-06-26 ENCOUNTER — Ambulatory Visit (INDEPENDENT_AMBULATORY_CARE_PROVIDER_SITE_OTHER): Payer: Medicare Other | Admitting: Internal Medicine

## 2013-06-26 VITALS — BP 132/70 | HR 67 | Temp 98.2°F | Wt 241.8 lb

## 2013-06-26 DIAGNOSIS — IMO0002 Reserved for concepts with insufficient information to code with codable children: Secondary | ICD-10-CM

## 2013-06-26 DIAGNOSIS — L02419 Cutaneous abscess of limb, unspecified: Secondary | ICD-10-CM

## 2013-06-26 DIAGNOSIS — S81009S Unspecified open wound, unspecified knee, sequela: Secondary | ICD-10-CM

## 2013-06-26 DIAGNOSIS — I872 Venous insufficiency (chronic) (peripheral): Secondary | ICD-10-CM | POA: Insufficient documentation

## 2013-06-26 MED ORDER — SULFAMETHOXAZOLE-TRIMETHOPRIM 800-160 MG PO TABS: 1.0000 | ORAL_TABLET | Freq: Two times a day (BID) | ORAL | Status: DC

## 2013-06-26 MED ORDER — PREGABALIN 100 MG PO CAPS: 100.0000 mg | ORAL_CAPSULE | Freq: Three times a day (TID) | ORAL | Status: DC

## 2013-06-26 MED ORDER — MUPIROCIN 2 % EX OINT: TOPICAL_OINTMENT | CUTANEOUS | Status: DC

## 2013-06-26 NOTE — Patient Instructions (Addendum)
It was good to see you today.  we'll make referral to wound care clinic. Our office will contact you regarding appointment(s) once made.  Septra antibiotics and new antibiotics ointment - Your prescription(s) have been submitted to your pharmacy. Please take as directed and contact our office if you believe you are having problem(s) with the medication(s).  Increase Lyrica to 100 mg 3 times daily -  Cellulitis Cellulitis is an infection of the skin and the tissue beneath it. The infected area is usually red and tender. Cellulitis occurs most often in the arms and lower legs.  CAUSES  Cellulitis is caused by bacteria that enter the skin through cracks or cuts in the skin. The most common types of bacteria that cause cellulitis are Staphylococcus and Streptococcus. SYMPTOMS   Redness and warmth.  Swelling.  Tenderness or pain.  Fever. DIAGNOSIS  Your caregiver can usually determine what is wrong based on a physical exam. Blood tests may also be done. TREATMENT  Treatment usually involves taking an antibiotic medicine. HOME CARE INSTRUCTIONS   Take your antibiotics as directed. Finish them even if you start to feel better.  Keep the infected arm or leg elevated to reduce swelling.  Apply a warm cloth to the affected area up to 4 times per day to relieve pain.  Only take over-the-counter or prescription medicines for pain, discomfort, or fever as directed by your caregiver.  Keep all follow-up appointments as directed by your caregiver. SEEK MEDICAL CARE IF:   You notice red streaks coming from the infected area.  Your red area gets larger or turns dark in color.  Your bone or joint underneath the infected area becomes painful after the skin has healed.  Your infection returns in the same area or another area.  You notice a swollen bump in the infected area.  You develop new symptoms. SEEK IMMEDIATE MEDICAL CARE IF:   You have a fever.  You feel very sleepy.  You  develop vomiting or diarrhea.  You have a general ill feeling (malaise) with muscle aches and pains. MAKE SURE YOU:   Understand these instructions.  Will watch your condition.  Will get help right away if you are not doing well or get worse. Document Released: 04/19/2005 Document Revised: 01/09/2012 Document Reviewed: 09/25/2011 [facility] Patient Information 2014 [place], [place].

## 2013-06-26 NOTE — Assessment & Plan Note (Signed)
Bilateral lower legs with chronic venous insufficiency and stasis dermatitis.  Open wounds related to same >4 weeks October 2014 dermatology evaluation for same reviewed: Biopsy unable to exclude vasculitis - recommended by derm Hortense Ramal) to see a vascular specialist - pending appointment with Dr Arbie Cookey for same.  Reminded patient to comply with compression stockings as prescribed and keep open sores covered as needed to prevent sticking.  Treatment of acute infection with antibiotics and refer to [facility] clinic

## 2013-06-26 NOTE — Progress Notes (Signed)
Pre-visit discussion using our clinic review tool. No additional management support is needed unless otherwise documented below in the visit note.  

## 2013-06-26 NOTE — Progress Notes (Signed)
Subjective:    Patient ID: [name], male    DOB: 24-Feb-1933, 79 y.o.   MRN: [id number]  HPI Comments: Pending VVS appt with Early for PAD?  Leg Pain  The incident occurred more than 1 week ago (>1 month since open wound, now worse in past 1 week). There was no injury mechanism. Pain location: L>R shin. The quality of the pain is described as aching and burning (tingling). The pain is at a severity of 8/10. The pain has been constant since onset. Associated symptoms include tingling. Pertinent negatives include no inability to bear weight or loss of sensation. He reports no foreign bodies present. Nothing aggravates the symptoms. He has tried rest for the symptoms.     Past Medical History  Diagnosis Date  . Gout, unspecified     severe dz  . HYPERLIPIDEMIA     intolerant of statins  . OBESITY   . Anxiety state, unspecified   . RESTLESS LEG SYNDROME   . DETACHMENT, RETINAL NOS 1994  . CATARACT NOS 2008  . HYPERTENSION   . MYOCARDIAL INFARCTION 2004, 03/2010  . CORONARY ARTERY DISEASE   . PERICARDITIS, ACUTE NEC 2007    MSSA, s/p pericardial window  . CHF   . GERD   . DEGENERATIVE JOINT DISEASE   . CAROTID ENDARTERECTOMY, LEFT, HX OF   . INSOMNIA, CHRONIC   . DEPRESSION   . OBSTRUCTIVE SLEEP APNEA     CPAP qhs  . TIA (transient ischemic attack) 2005  . BPH (benign prostatic hypertrophy)   . Lumbar spinal stenosis   . Carotid artery occlusion   . Stroke May 2007  . Ulcer   . Unspecified venous (peripheral) insufficiency     Review of Systems  Constitutional: Negative for fever, chills, activity change and appetite change.  Skin: Positive for wound.       Bilateral lower extremities.   Neurological: Positive for tingling.       Objective:   Physical Exam  Constitutional: He appears well-developed and well-nourished. No distress.  Cardiovascular: Normal rate and normal heart sounds.   Pulmonary/Chest: Effort normal and breath sounds normal. No respiratory  distress. He has no wheezes.  Musculoskeletal:  Chronic tophi changes  Skin: He is not diaphoretic.  L>R open wounds on bilateral lower extremities over shin. Chronic venous stasis changes. no blistering or drainage, no foul odor but surrounding cellulitis and soft tissue swelling.  Skin with rubor.  Temperature increased.  Psychiatric: He has a normal mood and affect. His behavior is normal. Judgment and thought content normal.    Wt Readings from Last 3 Encounters:  06/26/13 241 lb 12.8 oz (109.68 kg)  05/26/13 239 lb 6.4 oz (108.591 kg)  05/15/13 235 lb (106.595 kg)   BP Readings from Last 3 Encounters:  06/26/13 132/70  05/26/13 130/70  05/15/13 109/54   Lab Results  Component Value Date   WBC 6.9 02/06/2013   HGB 16.4 02/06/2013   HCT 49.7 02/06/2013   PLT 161.0 02/06/2013   GLUCOSE 141* 02/06/2013   CHOL 207* 02/06/2013   TRIG 315.0* 02/06/2013   HDL 28.70* 02/06/2013   LDLDIRECT 134.4 02/06/2013   LDLCALC  Value: 115        Total Cholesterol/HDL:CHD Risk Coronary Heart Disease Risk Table                     Men   Women  1/2 Average Risk   3.4   3.3  Average Risk  5.0   4.4  2 X Average Risk   9.6   7.1  3 X Average Risk  23.4   11.0        Use the calculated Patient Ratio above and the CHD Risk Table to determine the patient's CHD Risk.        ATP III CLASSIFICATION (LDL):  <100     mg/dL   Optimal  161-096  mg/dL   Near or Above                    Optimal  130-159  mg/dL   Borderline  045-409  mg/dL   High  >811     mg/dL   Very High* 04/06/7828   ALT 19 03/01/2012   AST 27 03/01/2012   NA 140 02/06/2013   K 5.2* 02/06/2013   CL 102 02/06/2013   CREATININE 1.7* 02/06/2013   BUN 30* 02/06/2013   CO2 31 02/06/2013   TSH 1.62 02/06/2013   INR 0.96 04/15/2010   HGBA1C 6.6* 02/06/2013   MICROALBUR 1.0 02/06/2013       Assessment & Plan:    Cellulitis, L>R LE - Open leg wounds contributing to same Venous insuff BLE - ?PAD or vasculitis - see below - derm eval for same 04/2013,  pending VVS eval next week   rx abx po and topical, wound care advised - send to [facility] clinic Treat pain related to same with increase in Lyrica from twice a day to 3 times a day  Also See problem list. Medications and labs reviewed today.

## 2013-07-04 ENCOUNTER — Encounter (HOSPITAL_BASED_OUTPATIENT_CLINIC_OR_DEPARTMENT_OTHER): Payer: Medicare Other | Attending: General Surgery

## 2013-07-04 DIAGNOSIS — I87319 Chronic venous hypertension (idiopathic) with ulcer of unspecified lower extremity: Secondary | ICD-10-CM | POA: Insufficient documentation

## 2013-07-04 DIAGNOSIS — L97909 Non-pressure chronic ulcer of unspecified part of unspecified lower leg with unspecified severity: Secondary | ICD-10-CM | POA: Insufficient documentation

## 2013-07-07 ENCOUNTER — Encounter: Payer: Self-pay | Admitting: Vascular Surgery

## 2013-07-08 ENCOUNTER — Ambulatory Visit (HOSPITAL_COMMUNITY)
Admission: RE | Admit: 2013-07-08 | Discharge: 2013-07-08 | Disposition: A | Discharge status: Home / Self Care | Payer: Medicare Other | Source: Ambulatory Visit | Attending: Vascular Surgery | Admitting: Vascular Surgery

## 2013-07-08 ENCOUNTER — Encounter: Payer: Self-pay | Admitting: Vascular Surgery

## 2013-07-08 ENCOUNTER — Ambulatory Visit (INDEPENDENT_AMBULATORY_CARE_PROVIDER_SITE_OTHER): Payer: Medicare Other | Admitting: Vascular Surgery

## 2013-07-08 VITALS — BP 157/50 | HR 66 | Ht 69.0 in | Wt 239.0 lb

## 2013-07-08 DIAGNOSIS — L97909 Non-pressure chronic ulcer of unspecified part of unspecified lower leg with unspecified severity: Secondary | ICD-10-CM | POA: Insufficient documentation

## 2013-07-08 DIAGNOSIS — I83009 Varicose veins of unspecified lower extremity with ulcer of unspecified site: Secondary | ICD-10-CM | POA: Insufficient documentation

## 2013-07-08 NOTE — Progress Notes (Signed)
Patient name: [name] MRN: [id number] DOB: 24-Dec-1932 Sex: male   Referred by: Felicity Coyer  Reason for referral:  Chief Complaint  Patient presents with  . New Evaluation    bilateral LE venous insufficiency and stasis ulcer - pt states he had a fall about 1 week ago and bruised his R hip and thigh, states he is having pain in that area    HISTORY OF PRESENT ILLNESS: Presents today for evaluation of bilateral lower extremity venous ulceration. He is well known to me from a prior left carotid endarterectomy in 2006. He has been followed in our office since that time with good durable result of his left endarterectomy and known moderate asymptomatic stenosis in his right carotid artery. Reports blister formation over the pretibial area bilaterally associated with edema. This is been treated appropriately at the wound center and has had improvement. He is seen today for further evaluation and treatment. He denies any arterial rest pain and tissue loss on his feet.  Past Medical History  Diagnosis Date  . Gout, unspecified     severe dz  . HYPERLIPIDEMIA     intolerant of statins  . OBESITY   . Anxiety state, unspecified   . RESTLESS LEG SYNDROME   . DETACHMENT, RETINAL NOS 1994  . CATARACT NOS 2008  . HYPERTENSION   . MYOCARDIAL INFARCTION 2004, 03/2010  . CORONARY ARTERY DISEASE   . PERICARDITIS, ACUTE NEC 2007    MSSA, s/p pericardial window  . CHF   . GERD   . DEGENERATIVE JOINT DISEASE   . CAROTID ENDARTERECTOMY, LEFT, HX OF   . INSOMNIA, CHRONIC   . DEPRESSION   . OBSTRUCTIVE SLEEP APNEA     CPAP qhs  . TIA (transient ischemic attack) 2005  . BPH (benign prostatic hypertrophy)   . Lumbar spinal stenosis   . Carotid artery occlusion   . Stroke May 2007  . Ulcer   . Unspecified venous (peripheral) insufficiency     Past Surgical History  Procedure Laterality Date  . Coronary angioplasty  01/2009  . Pericardial window  2007  . Left arm  2008  . Right arm     . Cataract extraction      Left side x's 2 and right  . Retinal detachment surgery      left side  . Shoulder surgery      bilateral  . Left foot     . Eye surgery    . Carotid endarterectomy Left 12-06-05    cea    History   Social History  . Marital Status: Married    Spouse Name: N/A    Number of Children: N/A  . Years of Education: N/A   Occupational History  . Not on file.   Social History Main Topics  . Smoking status: Never Smoker   . Smokeless tobacco: Never Used     Comment: Pt is widowed and lives alone. Pt has children. Retired from [employer]  . Alcohol Use: No  . Drug Use: No  . Sexual Activity: Not on file   Other Topics Concern  . Not on file   Social History Narrative  . No narrative on file    Family History  Problem Relation Age of Onset  . Heart disease Mother   . Diabetes Father   . Heart disease Father   . Breast cancer Sister   . Cancer Sister   . Prostate cancer Brother   .  Lung cancer Brother   . Cancer Brother   . Diabetes Daughter   . Diabetes Son     Allergies as of 07/08/2013 - Review Complete 07/08/2013  Allergen Reaction Noted  . Atorvastatin      Current Outpatient Prescriptions on File Prior to Visit  Medication Sig Dispense Refill  . allopurinol (ZYLOPRIM) 300 MG tablet Take 1 by mouth daily      . aspirin 81 MG tablet Take 81 mg by mouth daily.        Marland Kitchen buPROPion (WELLBUTRIN XL) 150 MG 24 hr tablet Take 1 tablet (150 mg total) by mouth daily.  90 tablet  2  . cholecalciferol (VITAMIN D) 1000 UNITS tablet Take 1,000 Units by mouth daily.        . clopidogrel (PLAVIX) 75 MG tablet Take 1 tablet (75 mg total) by mouth daily.  90 tablet  1  . Ferrous Sulfate (IRON) 28 MG TABS Take by mouth daily.      . furosemide (LASIX) 40 MG tablet Take 2 tablets (80 mg total) by mouth 2 (two) times daily.  360 tablet  2  . LORazepam (ATIVAN) 1 MG tablet Take 1 tablet (1 mg total) by mouth every 8 (eight) hours as  needed for anxiety.  180 tablet  1  . Magnesium 250 MG TABS Take by mouth daily.      . metoprolol (LOPRESSOR) 50 MG tablet Take 1 tablet (50 mg total) by mouth 2 (two) times daily.  180 tablet  1  . mirtazapine (REMERON) 15 MG tablet Take 1 tablet (15 mg total) by mouth at bedtime.  90 tablet  1  . mupirocin ointment (BACTROBAN) 2 % Apply to affected area 3 times daily as needed  22 g  0  . nitroGLYCERIN (NITROSTAT) 0.4 MG SL tablet Place 0.4 mg under the tongue every 5 (five) minutes as needed. For chest pain      . Omega-3 Fatty Acids (FISH OIL) 1200 MG CAPS Take by mouth daily.      Marland Kitchen oxycodone (OXY-IR) 5 MG capsule Take 1 capsule (5 mg total) by mouth every 12 (twelve) hours as needed for pain.  60 capsule  0  . pantoprazole (PROTONIX) 40 MG tablet Take 1 tablet (40 mg total) by mouth daily.  90 tablet  2  . potassium chloride SA (K-DUR,KLOR-CON) 20 MEQ tablet Take 1 tablet (20 mEq total) by mouth 2 (two) times daily.  180 tablet  1  . pregabalin (LYRICA) 100 MG capsule Take 1 capsule (100 mg total) by mouth 3 (three) times daily.  270 capsule  3  . rOPINIRole (REQUIP) 4 MG tablet Take 1 tablet (4 mg total) by mouth at bedtime.  90 tablet  2  . silver sulfADIAZINE (SILVADENE) 1 % cream Apply topically daily.      . solifenacin (VESICARE) 5 MG tablet Take 1 tablet (5 mg total) by mouth daily.      Marland Kitchen sulfamethoxazole-trimethoprim (SEPTRA DS) 800-160 MG per tablet Take 1 tablet by mouth 2 (two) times daily.  14 tablet  0  . Tamsulosin HCl (FLOMAX) 0.4 MG CAPS Take 1 capsule (0.4 mg total) by mouth daily.  30 capsule  3   No current facility-administered medications on file prior to visit.     REVIEW OF SYSTEMS:  Negative aside from his history of present illness and past medical history s  PHYSICAL EXAMINATION:  General: The patient is a well-nourished male, in no acute distress. Vital signs are  BP 157/50  Pulse 66  Ht 5\' 9"  (1.753 m)  Wt 239 lb (108.41 kg)  BMI 35.28 kg/m2  SpO2  100% Pulmonary: There is a good air exchange bilaterally   Musculoskeletal: There are no major deformities.   Neurologic: No focal weakness or paresthesias are detected, Skin: Does have an ulceration of the pretibial areas bilaterally. These appear to be healing Psychiatric: The patient has normal affect. Cardiovascular: 2+ dorsalis pedis pulses and 2+ popliteal pulses bilaterally Left carotid incision is well-healed. No carotid bruits bilaterally  VVS Vascular Lab Studies:  Ordered and Independently Reviewed venous duplex reveals deep venous reflux bilaterally. He does have some reflux in his right great saphenous vein although the vein is nondilated. There is mild dilatation left great saphenous vein with no evidence of reflux.  Impression and Plan:  Lateral lower extremity venous stasis disease related to deep venous reflux. I discussed this at length with the patient. I explained that he does not have any evidence of arterial insufficiency and therefore this will not be limb threatening. Have recommended continued appropriate treatment as he is receiving at the wound center. I did explain a critical importance of chronic use of his compression which she is compliant with once he has healed the ulceration. He will continue his followup in our office for his carotid disease.    Indie Boehne [facility] Office: [phone]

## 2013-07-29 ENCOUNTER — Telehealth: Payer: Self-pay | Admitting: Internal Medicine

## 2013-07-29 DIAGNOSIS — K4090 Unilateral inguinal hernia, without obstruction or gangrene, not specified as recurrent: Secondary | ICD-10-CM

## 2013-07-29 NOTE — Telephone Encounter (Signed)
Pt request phone call from the assistant concern about medication. Please advise.

## 2013-08-01 NOTE — Telephone Encounter (Signed)
Attempted to return patient's previous phone call.  No answer.  LM on voicemail.

## 2013-08-06 ENCOUNTER — Ambulatory Visit (INDEPENDENT_AMBULATORY_CARE_PROVIDER_SITE_OTHER): Payer: Medicare HMO | Admitting: Pulmonary Disease

## 2013-08-06 ENCOUNTER — Encounter: Payer: Self-pay | Admitting: Pulmonary Disease

## 2013-08-06 VITALS — BP 122/70 | HR 56 | Temp 97.7°F | Ht 69.0 in | Wt 244.2 lb

## 2013-08-06 DIAGNOSIS — G4733 Obstructive sleep apnea (adult) (pediatric): Secondary | ICD-10-CM

## 2013-08-06 NOTE — Patient Instructions (Signed)
Continue with cpap, and keep up with mask changes and supplies. Work on [employer] loss Speak with your primary physician about your pain at night. followup with me in one year.

## 2013-08-06 NOTE — Assessment & Plan Note (Signed)
The patient is wearing CPAP consistently, but continues to have frequent awakenings in sleep disruption from his chronic pain syndrome. I've asked him to discuss this with his primary care physician. With respect to his sleep apnea, he is to keep up with his mask changes and supplies, and to work aggressively on weight loss.

## 2013-08-06 NOTE — Progress Notes (Signed)
   Subjective:    Patient ID: [name], male    DOB: August 09, 1932, 80 y.o.   MRN: [id number]  HPI The patient comes in today for followup of his obstructive sleep apnea. He tells me that he is wearing CPAP compliantly, but is only getting about 4-5 hours of sleep a night. He is still having frequent awakenings, primarily due to to chronic pain from his musculoskeletal issues. He also is having issues with nocturia. He is having no difficulties with his mask or pressure at this time.   Review of Systems  Constitutional: Negative for fever and unexpected weight change.  HENT: Negative for congestion, dental problem, ear pain, nosebleeds, postnasal drip, rhinorrhea, sinus pressure, sneezing, sore throat and trouble swallowing.   Eyes: Negative for redness and itching.  Respiratory: Negative for cough, chest tightness, shortness of breath and wheezing.   Cardiovascular: Negative for palpitations and leg swelling.  Gastrointestinal: Negative for nausea and vomiting.  Genitourinary: Negative for dysuria.  Musculoskeletal: Negative for joint swelling.  Skin: Negative for rash.  Neurological: Negative for headaches.  Hematological: Does not bruise/bleed easily.  Psychiatric/Behavioral: Negative for dysphoric mood. The patient is not nervous/anxious.        Objective:   Physical Exam Obese male in no acute distress Nose without purulence or discharge noted No skin breakdown or pressure necrosis from the CPAP mask Neck without lymphadenopathy or thyromegaly Lower extremities with mild edema, no cyanosis Alert and oriented, moves all 4 extremities.       Assessment & Plan:

## 2013-08-11 NOTE — Telephone Encounter (Signed)
Referral to general surgery ordered as requested

## 2013-08-11 NOTE — Telephone Encounter (Signed)
PER ANN  From  [facility] pt need a referral to [facility] He's going to see Dr Harden Mo  Hernia repair need  To submit this to his insurance company thank you

## 2013-08-20 ENCOUNTER — Ambulatory Visit (INDEPENDENT_AMBULATORY_CARE_PROVIDER_SITE_OTHER): Payer: Medicare HMO | Admitting: Internal Medicine

## 2013-08-20 ENCOUNTER — Other Ambulatory Visit (INDEPENDENT_AMBULATORY_CARE_PROVIDER_SITE_OTHER): Payer: Medicare HMO

## 2013-08-20 ENCOUNTER — Encounter: Payer: Self-pay | Admitting: Internal Medicine

## 2013-08-20 VITALS — BP 130/70 | HR 62 | Temp 97.6°F | Wt 243.8 lb

## 2013-08-20 DIAGNOSIS — E1159 Type 2 diabetes mellitus with other circulatory complications: Secondary | ICD-10-CM

## 2013-08-20 DIAGNOSIS — E1169 Type 2 diabetes mellitus with other specified complication: Secondary | ICD-10-CM

## 2013-08-20 DIAGNOSIS — I798 Other disorders of arteries, arterioles and capillaries in diseases classified elsewhere: Secondary | ICD-10-CM

## 2013-08-20 DIAGNOSIS — L97509 Non-pressure chronic ulcer of other part of unspecified foot with unspecified severity: Secondary | ICD-10-CM

## 2013-08-20 DIAGNOSIS — F329 Major depressive disorder, single episode, unspecified: Secondary | ICD-10-CM

## 2013-08-20 DIAGNOSIS — M1A9XX1 Chronic gout, unspecified, with tophus (tophi): Secondary | ICD-10-CM

## 2013-08-20 DIAGNOSIS — F3289 Other specified depressive episodes: Secondary | ICD-10-CM

## 2013-08-20 DIAGNOSIS — E11621 Type 2 diabetes mellitus with foot ulcer: Secondary | ICD-10-CM

## 2013-08-20 LAB — HEMOGLOBIN A1C: HEMOGLOBIN A1C: 6.4 % (ref 4.6–6.5)

## 2013-08-20 MED ORDER — METOPROLOL TARTRATE 50 MG PO TABS: 50.0000 mg | ORAL_TABLET | Freq: Two times a day (BID) | ORAL | Status: DC

## 2013-08-20 MED ORDER — ROPINIROLE HCL 4 MG PO TABS: 4.0000 mg | ORAL_TABLET | Freq: Every day | ORAL | Status: DC

## 2013-08-20 MED ORDER — LORAZEPAM 1 MG PO TABS
1.0000 mg | ORAL_TABLET | Freq: Three times a day (TID) | ORAL | Status: DC | PRN
Start: 2013-08-20 — End: 2013-09-02

## 2013-08-20 MED ORDER — MIRTAZAPINE 15 MG PO TABS: 15.0000 mg | ORAL_TABLET | Freq: Every day | ORAL | Status: DC

## 2013-08-20 MED ORDER — SOLIFENACIN SUCCINATE 5 MG PO TABS: 5.0000 mg | ORAL_TABLET | Freq: Every day | ORAL | Status: DC

## 2013-08-20 MED ORDER — PANTOPRAZOLE SODIUM 40 MG PO TBEC: 40.0000 mg | DELAYED_RELEASE_TABLET | Freq: Every day | ORAL | Status: DC

## 2013-08-20 MED ORDER — ALLOPURINOL 300 MG PO TABS: 300.0000 mg | ORAL_TABLET | Freq: Every day | ORAL | Status: DC

## 2013-08-20 MED ORDER — FUROSEMIDE 40 MG PO TABS: 80.0000 mg | ORAL_TABLET | Freq: Two times a day (BID) | ORAL | Status: DC

## 2013-08-20 MED ORDER — TAMSULOSIN HCL 0.4 MG PO CAPS: 0.4000 mg | ORAL_CAPSULE | Freq: Every day | ORAL | Status: DC

## 2013-08-20 NOTE — Progress Notes (Signed)
Pre-visit discussion using our clinic review tool. No additional management support is needed unless otherwise documented below in the visit note.  

## 2013-08-20 NOTE — Patient Instructions (Addendum)
It was good to see you today.  We have reviewed your prior records including labs and tests today  Test(s) ordered today. Your results will be released to MyChart (or called to you) after review, usually within 72hours after test completion. If any changes need to be made, you will be notified at that same time.  we'll make referral to   . Our office will contact you regarding appointment(s) once made.  Medications reviewed and updated, no changes recommended at this time. Refill on medication(s) as discussed today - printed and given to you  Please schedule followup in 3-4 months, call sooner if problems. . Diabetes and Foot Care Diabetes may cause you to have problems because of poor blood supply (circulation) to your feet and legs. This may cause the skin on your feet to become thinner, break easier, and heal more slowly. Your skin may become dry, and the skin may peel and crack. You may also have nerve damage in your legs and feet causing decreased feeling in them. You may not notice minor injuries to your feet that could lead to infections or more serious problems. Taking care of your feet is one of the most important things you can do for yourself.  HOME CARE INSTRUCTIONS  Wear shoes at all times, even in the house. Do not go barefoot. Bare feet are easily injured.  Check your feet daily for blisters, cuts, and redness. If you cannot see the bottom of your feet, use a mirror or ask someone for help.  Wash your feet with warm water (do not use hot water) and mild soap. Then pat your feet and the areas between your toes until they are completely dry. Do not soak your feet as this can dry your skin.  Apply a moisturizing lotion or petroleum jelly (that does not contain alcohol and is unscented) to the skin on your feet and to dry, brittle toenails. Do not apply lotion between your toes.  Trim your toenails straight across. Do not dig under them or around the cuticle. File the edges of  your nails with an emery board or nail file.  Do not cut corns or calluses or try to remove them with medicine.  Wear clean socks or stockings every day. Make sure they are not too tight. Do not wear knee-high stockings since they may decrease blood flow to your legs.  Wear shoes that fit properly and have enough cushioning. To break in new shoes, wear them for just a few hours a day. This prevents you from injuring your feet. Always look in your shoes before you put them on to be sure there are no objects inside.  Do not cross your legs. This may decrease the blood flow to your feet.  If you find a minor scrape, cut, or break in the skin on your feet, keep it and the skin around it clean and dry. These areas may be cleansed with mild soap and water. Do not cleanse the area with peroxide, alcohol, or iodine.  When you remove an adhesive bandage, be sure not to damage the skin around it.  If you have a wound, look at it several times a day to make sure it is healing.  Do not use heating pads or hot water bottles. They may burn your skin. If you have lost feeling in your feet or legs, you may not know it is happening until it is too late.  Make sure your health care provider performs a complete  foot exam at least annually or more often if you have foot problems. Report any cuts, sores, or bruises to your health care provider immediately. SEEK MEDICAL CARE IF:   You have an injury that is not healing.  You have cuts or breaks in the skin.  You have an ingrown nail.  You notice redness on your legs or feet.  You feel burning or tingling in your legs or feet.  You have pain or cramps in your legs and feet.  Your legs or feet are numb.  Your feet always feel cold. SEEK IMMEDIATE MEDICAL CARE IF:   There is increasing redness, swelling, or pain in or around a wound.  There is a red line that goes up your leg.  Pus is coming from a wound.  You develop a fever or as directed by  your health care provider.  You notice a bad smell coming from an ulcer or wound. Document Released: 07/07/2000 Document Revised: 03/12/2013 Document Reviewed: 12/17/2012 [facility] Patient Information 2014 [place], [place]. Diabetes and Foot Care Diabetes may cause you to have problems because of poor blood supply (circulation) to your feet and legs. This may cause the skin on your feet to become thinner, break easier, and heal more slowly. Your skin may become dry, and the skin may peel and crack. You may also have nerve damage in your legs and feet causing decreased feeling in them. You may not notice minor injuries to your feet that could lead to infections or more serious problems. Taking care of your feet is one of the most important things you can do for yourself.  HOME CARE INSTRUCTIONS  Wear shoes at all times, even in the house. Do not go barefoot. Bare feet are easily injured.  Check your feet daily for blisters, cuts, and redness. If you cannot see the bottom of your feet, use a mirror or ask someone for help.  Wash your feet with warm water (do not use hot water) and mild soap. Then pat your feet and the areas between your toes until they are completely dry. Do not soak your feet as this can dry your skin.  Apply a moisturizing lotion or petroleum jelly (that does not contain alcohol and is unscented) to the skin on your feet and to dry, brittle toenails. Do not apply lotion between your toes.  Trim your toenails straight across. Do not dig under them or around the cuticle. File the edges of your nails with an emery board or nail file.  Do not cut corns or calluses or try to remove them with medicine.  Wear clean socks or stockings every day. Make sure they are not too tight. Do not wear knee-high stockings since they may decrease blood flow to your legs.  Wear shoes that fit properly and have enough cushioning. To break in new shoes, wear them for just a few hours a day. This  prevents you from injuring your feet. Always look in your shoes before you put them on to be sure there are no objects inside.  Do not cross your legs. This may decrease the blood flow to your feet.  If you find a minor scrape, cut, or break in the skin on your feet, keep it and the skin around it clean and dry. These areas may be cleansed with mild soap and water. Do not cleanse the area with peroxide, alcohol, or iodine.  When you remove an adhesive bandage, be sure not to damage the skin around  it.  If you have a wound, look at it several times a day to make sure it is healing.  Do not use heating pads or hot water bottles. They may burn your skin. If you have lost feeling in your feet or legs, you may not know it is happening until it is too late.  Make sure your health care provider performs a complete foot exam at least annually or more often if you have foot problems. Report any cuts, sores, or bruises to your health care provider immediately. SEEK MEDICAL CARE IF:   You have an injury that is not healing.  You have cuts or breaks in the skin.  You have an ingrown nail.  You notice redness on your legs or feet.  You feel burning or tingling in your legs or feet.  You have pain or cramps in your legs and feet.  Your legs or feet are numb.  Your feet always feel cold. SEEK IMMEDIATE MEDICAL CARE IF:   There is increasing redness, swelling, or pain in or around a wound.  There is a red line that goes up your leg.  Pus is coming from a wound.  You develop a fever or as directed by your health care provider.  You notice a bad smell coming from an ulcer or wound. Document Released: 07/07/2000 Document Revised: 03/12/2013 Document Reviewed: 12/17/2012 [employer] Patient Information 2014 [place].

## 2013-08-20 NOTE — Assessment & Plan Note (Signed)
Steroid induced in past - no steroids since 2011 Recheck a1c q6-12 mo and consider metformin or other med if elevated The patient is asked to make an attempt to improve diet and exercise patterns to aid in medical management of this problem.  Lab Results  Component Value Date   HGBA1C 6.6* 02/06/2013

## 2013-08-20 NOTE — Assessment & Plan Note (Signed)
Hx severe, resistant dz and tophi -  2012 treatment with Krystexxa stopped due to increased uric acid levels Back on Allopurinol since spring 2013 Follows with rheum prn for same No acute flares

## 2013-08-20 NOTE — Assessment & Plan Note (Signed)
Overlap anxiety and depression Uses chronic BID lorazepam and wellbutrin + remeron qhs The current medical regimen is effective;  continue present plan and medications. Refills provided

## 2013-08-20 NOTE — Progress Notes (Signed)
Subjective:    Patient ID: [name], male    DOB: 01-10-1933, 80 y.o.   MRN: [id number]  HPI  Here for follow up - reviewed chronic medical issues and interval medical events  Past Medical History  Diagnosis Date  . Gout, unspecified     severe dz  . HYPERLIPIDEMIA     intolerant of statins  . OBESITY   . Anxiety state, unspecified   . RESTLESS LEG SYNDROME   . DETACHMENT, RETINAL NOS 1994  . CATARACT NOS 2008  . HYPERTENSION   . MYOCARDIAL INFARCTION 2004, 03/2010  . CORONARY ARTERY DISEASE   . PERICARDITIS, ACUTE NEC 2007    MSSA, s/p pericardial window  . CHF   . GERD   . DEGENERATIVE JOINT DISEASE   . CAROTID ENDARTERECTOMY, LEFT, HX OF   . INSOMNIA, CHRONIC   . DEPRESSION   . OBSTRUCTIVE SLEEP APNEA     CPAP qhs  . TIA (transient ischemic attack) 2005  . BPH (benign prostatic hypertrophy)   . Lumbar spinal stenosis   . Carotid artery occlusion   . Stroke May 2007  . Ulcer   . Unspecified venous (peripheral) insufficiency     Review of Systems  Constitutional: Negative for fever, fatigue and unexpected weight change.  Respiratory: Positive for shortness of breath (chronic, mild DOE). Negative for cough.   Cardiovascular: Positive for leg swelling (chronic, no change). Negative for chest pain.       Objective:   Physical Exam  Wt Readings from Last 3 Encounters:  08/20/13 243 lb 12.8 oz (110.587 kg)  08/06/13 244 lb 3.2 oz (110.768 kg)  07/08/13 239 lb (108.41 kg)   Gen: obese, no acute distress Lungs: diminished breath sounds at bases, no wheeze or crackle. No increase or debris the Cardiovascular: regular rate and rhythm, chronic 1+ edema bilateral lower extremities with chronic venous changes Skin: thickened callous skin medial side of left second toe with pre-ulcerative lesion. No evidence of active cellulitis or ulceration. Nails diffusely with fungal changes and overgrown Musculoskeletal: chronic tophi changes, no acute gouty flare  Lab  Results  Component Value Date   WBC 6.9 02/06/2013   HGB 16.4 02/06/2013   HCT 49.7 02/06/2013   PLT 161.0 02/06/2013   GLUCOSE 141* 02/06/2013   CHOL 207* 02/06/2013   TRIG 315.0* 02/06/2013   HDL 28.70* 02/06/2013   LDLDIRECT 134.4 02/06/2013   LDLCALC  Value: 115        Total Cholesterol/HDL:CHD Risk Coronary Heart Disease Risk Table                     Men   Women  1/2 Average Risk   3.4   3.3  Average Risk       5.0   4.4  2 X Average Risk   9.6   7.1  3 X Average Risk  23.4   11.0        Use the calculated Patient Ratio above and the CHD Risk Table to determine the patient's CHD Risk.        ATP III CLASSIFICATION (LDL):  <100     mg/dL   Optimal  694-854  mg/dL   Near or Above                    Optimal  130-159  mg/dL   Borderline  627-035  mg/dL   High  >009     mg/dL   Very High*  04/14/2010   ALT 19 03/01/2012   AST 27 03/01/2012   NA 140 02/06/2013   K 5.2* 02/06/2013   CL 102 02/06/2013   CREATININE 1.7* 02/06/2013   BUN 30* 02/06/2013   CO2 31 02/06/2013   TSH 1.62 02/06/2013   INR 0.96 04/15/2010   HGBA1C 6.6* 02/06/2013   MICROALBUR 1.0 02/06/2013       Assessment & Plan:   See problem list. Medications and labs reviewed today.  Callus second toe left foot, pre-ulcerative lesion noted. Refer to podiatry for further evaluation and treatment including nail care. Advised to keep cotton between toes to minimize abrasion/friction. No evidence for infection at this time

## 2013-08-21 ENCOUNTER — Telehealth: Payer: Self-pay

## 2013-08-21 MED ORDER — PANTOPRAZOLE SODIUM 40 MG PO TBEC: 40.0000 mg | DELAYED_RELEASE_TABLET | Freq: Every day | ORAL | Status: DC

## 2013-08-21 MED ORDER — ALLOPURINOL 300 MG PO TABS
300.0000 mg | ORAL_TABLET | Freq: Every day | ORAL | Status: DC
Start: 2013-08-21 — End: 2013-09-02

## 2013-08-21 NOTE — Telephone Encounter (Signed)
Yes, please print Lyrica and I will sign to fax

## 2013-08-21 NOTE — Telephone Encounter (Signed)
Phone call from [name], pt's daughter stating patient was seen yesterday and was to have a script for Lyrica, Pantoprazole, and Allopurinol sent to [facility]. I just want to verify before I send them over.

## 2013-08-21 NOTE — Telephone Encounter (Signed)
Dr Felicity Coyer is it okay to fill Lyrica?

## 2013-08-21 NOTE — Telephone Encounter (Signed)
Pt call back and stated that [facility] is the right pharmacy to send all three med in. Thank you, pt will go check again tomorrow.

## 2013-08-22 MED ORDER — PREGABALIN 100 MG PO CAPS: 100.0000 mg | ORAL_CAPSULE | Freq: Three times a day (TID) | ORAL | Status: DC

## 2013-08-22 NOTE — Telephone Encounter (Signed)
Lyrica has been called in.

## 2013-08-22 NOTE — Addendum Note (Signed)
Addended by: Lyanne Co R on: 08/22/2013 08:31 AM   Modules accepted: Orders

## 2013-08-25 ENCOUNTER — Ambulatory Visit (INDEPENDENT_AMBULATORY_CARE_PROVIDER_SITE_OTHER): Payer: Medicare Other | Admitting: General Surgery

## 2013-08-25 ENCOUNTER — Encounter (INDEPENDENT_AMBULATORY_CARE_PROVIDER_SITE_OTHER): Payer: Self-pay

## 2013-08-25 ENCOUNTER — Encounter (INDEPENDENT_AMBULATORY_CARE_PROVIDER_SITE_OTHER): Payer: Self-pay | Admitting: General Surgery

## 2013-08-25 VITALS — BP 114/82 | HR 76 | Temp 98.0°F | Resp 18 | Ht 71.0 in | Wt 240.0 lb

## 2013-08-25 DIAGNOSIS — K4090 Unilateral inguinal hernia, without obstruction or gangrene, not specified as recurrent: Secondary | ICD-10-CM

## 2013-08-25 NOTE — Progress Notes (Signed)
Patient ID: [name], male   DOB: 1933/03/06, 80 y.o.   MRN: [id number]  Chief Complaint  Patient presents with  . New Evaluation    Hernia    HPI [name] is a 80 y.o. male.  The patient is a 80 year old male who is referred by Dr. Felicity Coyer for evaluation of a large left inguinal hernia. The patient states he notices there for the last approximately 3-4 months. He states it causes him discomfort and minimal pain.  The patient states that it does not self reduce. Patient said no signs or symptoms of incarceration or strangulation.  HPI  Past Medical History  Diagnosis Date  . Gout, unspecified     severe dz  . HYPERLIPIDEMIA     intolerant of statins  . OBESITY   . Anxiety state, unspecified   . RESTLESS LEG SYNDROME   . DETACHMENT, RETINAL NOS 1994  . CATARACT NOS 2008  . HYPERTENSION   . MYOCARDIAL INFARCTION 2004, 03/2010  . CORONARY ARTERY DISEASE   . PERICARDITIS, ACUTE NEC 2007    MSSA, s/p pericardial window  . CHF   . GERD   . DEGENERATIVE JOINT DISEASE   . CAROTID ENDARTERECTOMY, LEFT, HX OF   . INSOMNIA, CHRONIC   . DEPRESSION   . OBSTRUCTIVE SLEEP APNEA     CPAP qhs  . TIA (transient ischemic attack) 2005  . BPH (benign prostatic hypertrophy)   . Lumbar spinal stenosis   . Carotid artery occlusion   . Stroke May 2007  . Ulcer   . Unspecified venous (peripheral) insufficiency     Past Surgical History  Procedure Laterality Date  . Coronary angioplasty  01/2009  . Pericardial window  2007  . Left arm  2008  . Right arm    . Cataract extraction      Left side x's 2 and right  . Retinal detachment surgery      left side  . Shoulder surgery      bilateral  . Left foot     . Eye surgery    . Carotid endarterectomy Left 12-06-05    cea    Family History  Problem Relation Age of Onset  . Heart disease Mother   . Diabetes Father   . Heart disease Father   . Breast cancer Sister   . Cancer Sister   . Prostate cancer Brother   . Lung  cancer Brother   . Cancer Brother   . Diabetes Daughter   . Diabetes Son     Social History History  Substance Use Topics  . Smoking status: Never Smoker   . Smokeless tobacco: Never Used     Comment: Pt is widowed and lives alone. Pt has children. Retired from [employer]  . Alcohol Use: No    Allergies  Allergen Reactions  . Atorvastatin     REACTION: tol simvastatin ok per pt ( Pt. Says ALL the Statins )    Current Outpatient Prescriptions  Medication Sig Dispense Refill  . allopurinol (ZYLOPRIM) 300 MG tablet Take 1 tablet (300 mg total) by mouth daily.  30 tablet  5  . aspirin 81 MG tablet Take 81 mg by mouth daily.        Marland Kitchen buPROPion (WELLBUTRIN XL) 150 MG 24 hr tablet Take 1 tablet (150 mg total) by mouth daily.  90 tablet  2  . cholecalciferol (VITAMIN D) 1000 UNITS tablet Take 1,000 Units by mouth daily.        Marland Kitchen  clopidogrel (PLAVIX) 75 MG tablet Take 1 tablet (75 mg total) by mouth daily.  90 tablet  1  . Ferrous Sulfate (IRON) 28 MG TABS Take by mouth daily.      . furosemide (LASIX) 40 MG tablet Take 2 tablets (80 mg total) by mouth 2 (two) times daily.  120 tablet  0  . LORazepam (ATIVAN) 1 MG tablet Take 1 tablet (1 mg total) by mouth every 8 (eight) hours as needed for anxiety.  90 tablet  1  . Magnesium 250 MG TABS Take by mouth daily.      . metoprolol (LOPRESSOR) 50 MG tablet Take 1 tablet (50 mg total) by mouth 2 (two) times daily.  60 tablet  0  . mirtazapine (REMERON) 15 MG tablet Take 1 tablet (15 mg total) by mouth at bedtime.  30 tablet  0  . nitroGLYCERIN (NITROSTAT) 0.4 MG SL tablet Place 0.4 mg under the tongue every 5 (five) minutes as needed. For chest pain      . Omega-3 Fatty Acids (FISH OIL) 1200 MG CAPS Take by mouth daily.      Marland Kitchen oxycodone (OXY-IR) 5 MG capsule Take 1 capsule (5 mg total) by mouth every 12 (twelve) hours as needed for pain.  60 capsule  0  . pantoprazole (PROTONIX) 40 MG tablet Take 1 tablet (40 mg total) by mouth  daily.  30 tablet  5  . potassium chloride SA (K-DUR,KLOR-CON) 20 MEQ tablet Take 1 tablet (20 mEq total) by mouth 2 (two) times daily.  180 tablet  1  . pregabalin (LYRICA) 100 MG capsule Take 1 capsule (100 mg total) by mouth 3 (three) times daily.  270 capsule  3  . rOPINIRole (REQUIP) 4 MG tablet Take 1 tablet (4 mg total) by mouth at bedtime.  30 tablet  0  . solifenacin (VESICARE) 5 MG tablet Take 1 tablet (5 mg total) by mouth daily.  30 tablet  0  . tamsulosin (FLOMAX) 0.4 MG CAPS capsule Take 1 capsule (0.4 mg total) by mouth daily.  30 capsule  3   No current facility-administered medications for this visit.    Review of Systems Review of Systems  Constitutional: Negative.   HENT: Negative.   Eyes: Negative.   Respiratory: Negative.   Cardiovascular: Negative.   Gastrointestinal: Negative.   Endocrine: Negative.   Neurological: Negative.     Blood pressure 114/82, pulse 76, temperature 98 F (36.7 C), resp. rate 18, height 5\' 11"  (1.803 m), weight 240 lb (108.863 kg).  Physical Exam Physical Exam  Constitutional: He is oriented to person, place, and time. He appears well-developed and well-nourished.  HENT:  Head: Normocephalic and atraumatic.  Eyes: Conjunctivae and EOM are normal. Pupils are equal, round, and reactive to light.  Neck: Normal range of motion. Neck supple.  Cardiovascular: Normal rate, regular rhythm and normal heart sounds.   Pulmonary/Chest: Effort normal and breath sounds normal.  Abdominal: Soft. Bowel sounds are normal. He exhibits no distension and no mass. There is no tenderness. There is no rebound and no guarding. A hernia is present. Hernia confirmed positive in the left inguinal area. Hernia confirmed negative in the right inguinal area.  Musculoskeletal: Normal range of motion.  Neurological: He is alert and oriented to person, place, and time.  Skin: Skin is warm and dry.    Data Reviewed none  Assessment    80 year old male with a  large left inguinal hernia. Patient will see Dr. Donnie Aho first cardiac issues  and is on Plavix.     Plan    1. We'll proceed to the operating room for a open left inguinal hernia repair with mesh. 2. All risks and benefits were discussed with the patient, to generally include infection, bleeding, damage to surrounding structures, acute and chronic nerve pain, and recurrence. Alternatives were offered and described.  All questions were answered and the patient voiced understanding of the procedure and wishes to proceed at this point. 3. We will get cardiac evaluation by Dr. Donnie Ahoilley as well as evaluation to hold his Plavix for 7 days prior to scheduling.        Marigene Ehlersamirez Jr., Nathan Moctezuma 08/25/2013, 2:30 PM

## 2013-08-26 ENCOUNTER — Encounter: Payer: Self-pay | Admitting: Cardiology

## 2013-08-26 DIAGNOSIS — I359 Nonrheumatic aortic valve disorder, unspecified: Secondary | ICD-10-CM

## 2013-08-26 NOTE — Progress Notes (Unsigned)
Patient ID: [name], male   DOB: 03-15-1933, 80 y.o.   MRN: [id number]   [name], Taher T  Date of visit:  08/26/2013 DOB:  008-22-1934    Age:  [age] yrs. Medical record number:  [id number]     Account number:  [id number] Primary Care Provider: Rene PaciLESCHBER,VALERIE ANN ____________________________ CURRENT DIAGNOSES  1. CAD,Native  2. Pre-Op Cardiovascular Exam  3. Stent Placement (drug eluting)  4. Aortic Valve Disorder  5. Obesity(BMI30-40)  6. Hypertensive Heart Disease-Benign without CHF  7. Sleep Apnea  8. Restless Legs Syndrome (rls)  9. Diabetes Mellitus-NIDD  10. Hyperlipidemia  11. Gouty Arthropathy Unspecified  12. Atherosclerosis-Carotid Artery  13. Dyspnea  14. Personal history of TIA or stroke without residua ____________________________ ALLERGIES  Atorvastatin, Muscle aches  Febuxostat, Rash  Simvastatin, Muscle aches ____________________________ MEDICATIONS  1. Requip 4 mg Tablet, 1 p.o. daily  2. Vitamin D 1,000 unit Capsule, 1 p.o. daily  3. oxycodone 5 mg Capsule, PRN  4. mirtazapine 15 mg Tablet, Rapid Dissolve, 1 p.o. daily  5. metoprolol tartrate 50 mg Tablet, BID  6. furosemide 40 mg Tablet, BID  7. Klor-Con M20 20 mEq Tablet, ER Particles/Crystals, BID  8. lorazepam 1 mg Tablet, 1 p.o. daily  9. pantoprazole 40 mg Recon Soln, 1 p.o. daily  10. Vitamin B-12 1,000 mcg Tablet, 1 p.o. daily  11. Wellbutrin SR 150 mg tablet extended release, 1 p.o. daily  12. allopurinol 300 mg tablet, 1 p.o. daily  13. ferrous gluconate 256 mg (28 mg iron) tablet, 1 p.o. daily  14. magnesium 250 mg tablet, 1 p.o. daily  15. Fish Oil 1,000 mg capsule, 1 p.o. daily  16. Lyrica 100 mg capsule, BID  17. clopidogrel 75 mg tablet, 1 p.o. daily  18. tamsulosin ER 0.4 mg capsule,extended release 24 hr, BID  19. aspirin 81 mg chewable tablet, 1 p.o. daily ____________________________ CHIEF COMPLAINTS  To have hernia surg ____________________________ HISTORY OF PRESENT  ILLNESS 80 year old male seen for preoperative cardiac evaluation. The patient has a prior history of coronary artery disease, prior cerebrovascular disease and a prior right brain CVA. 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 He has some limitation of his activity due to his previous stroke for the most part gets along fairly well. He has developed a fairly large hernia that is in need of repair. He has not had any recent angina and is not currently using nitroglycerin. He does not have any overt congestive heart failure. He has a known history of mild-to-moderate aortic stenosis and had an echocardiogram in December that showed an ejection fraction of 50% and only mild to moderate aortic stenosis. A catheterization in 2011 showed a circumflex stent to be widely patent. He has been on Plavix. He denies PND, orthopnea or claudication. He has been treated for some venous stasis ulcers on his lower extremities but these have now healed. ____________________________ PAST HISTORY  Past Medical Illnesses:  hypertension, hyperlipidemia, obesity, gout, TIA, restless legs, BPH, right brain CVA, osteoarthritis, sleep apnea;  Cardiovascular Illnesses:  CAD, staph pericarditis 11/07 with window, history of  carotid atery disease;  Surgical Procedures:  cataract extraction OU, detached retina, carpal tunnel release, shoulder repair-rt, nasal surg, carotid endarterectomy-left;  NYHA Classification:  II;  Cardiology Procedures-Invasive:  pericardial window 06/2006, cardiac cath (left) July 2010, Xience DES stent  circumflex  July 2010  Dr. Excell Seltzerooper, cardiac cath (left) September 2011;  Cardiology Procedures-Noninvasive:  regadenoson thallium July 2010, echocardiogram May 2013,  echocardiogram January 2014, echocardiogram December 2014;  Cardiac Cath Results:  normal Left main, 20 % stenosis proximal LAD, 40% stenosis mid LAD, 70% stenosis proximal Diag 1, 90% stenosis prox CFX, 90%  first OM, small and nondominant RCA;  LVEF of 50% documented via echocardiogram on 07/09/2013,   ____________________________ CARDIO-PULMONARY TEST DATES EKG Date:  08/26/2013;   Cardiac Cath Date:  04/15/2010;  Stent Placement Date: 02/11/2009;  Nuclear Study Date:  02/04/2009;  Echocardiography Date: 07/09/2013;  Chest Xray Date: 04/11/2010;   ____________________________ FAMILY HISTORY Brother -- Brother dead, Carcinoma of the pancreas Father -- Father dead, Coronary Artery Disease, Deceased Mother -- Mother dead, Coronary Artery Disease Sister -- Sister alive and well Sister -- Sister dead, Chronic obstructive lung disease Sister -- Sister dead, Cancer Sister -- Sister alive with problem, Dementia/Alzheimer's ____________________________ SOCIAL HISTORY Alcohol Use:  no alcohol use;  Smoking:  never smoked;  Diet:  regular diet;  Lifestyle:  widower;  Exercise:  no regular exercise;  Occupation:  retired and [employer];  Residence:  lives with daughter;   ____________________________ REVIEW OF SYSTEMS General:  obesity  Integumentary:easy bruisability Eyes: history of retinal detachment, cataract extraction O.U. Respiratory: mild dyspnea with exertion Cardiovascular:  please review HPI Abdominal: see HPI Genitourinary-Male: nocturia, erectile dysfunction  Musculoskeletal:  arthritis of the shoulder, arthritis of the neck, restless legs, chronic low back pain Neurological:  left arm weakness  ____________________________ PHYSICAL EXAMINATION VITAL SIGNS  Blood Pressure:  110/60 Sitting, Left arm, regular cuff  , 118/60 Standing, Left arm and regular cuff   Pulse:  60/min. Weight:  237.50 lbs. Height:  70"BMI: 34  Constitutional:  pleasant white male in no acute distress, moderately obese Skin:  scattered hemangiomas, multiple seborrhic keratosis Head:  normocephalic, normal hair pattern, no masses or tenderness ENT:  rosacea, several missing teeth Neck:  supple, no masses,  thyromegaly, JVD. Carotid pulses are full and equal bilaterally without bruits., healed left carotid endarterectomy scar Chest:  normal symmetry, clear to auscultation. Cardiac:  regular rhythm, normal S1 and S2, no S3 or S4, grade 2/6 systolic murmur left sternal border Peripheral Pulses:  pulses full and equal in all extremities Extremities & Back:  large tophaceous knot on right knee and elbows, 1+ edema, bilateral venous insufficiency changes present Neurological:  weakness left hand ____________________________ MOST RECENT LIPID PANEL 07/09/13  CHOL TOTL 192 mg/dl, LDL 334 NM, HDL 25 mg/dl, TRIGLYCER 356 mg/dl and CHOL/HDL 7.8 (Calc) ____________________________ IMPRESSIONS/PLAN  1. From a cardiovascular viewpoint may proceed with the planned hernia surgery. His cardiovascular status has been stable. He should continue his cardiac medications except he may discontinue Plavix 7 days prior to the planned surgery and restart following the surgery. He should continue aspirin during the perioperative period. 2. Coronary artery disease with previous stent to the circumflex and moderately severe disease involving the right coronary artery and moderate LAD disease 3. Mild to moderate aortic stenosis 4. Prior right brain CVA 5. Hypertensive heart disease   EKG shows an IV conduction delay and sinus arrhythmia. ____________________________ TODAYS ORDERS  1. Return Visit: keep appt  2. 12 Lead EKG: Today                       ____________________________ Cardiology Physician:  Darden Palmer MD [facility]

## 2013-08-27 ENCOUNTER — Other Ambulatory Visit (INDEPENDENT_AMBULATORY_CARE_PROVIDER_SITE_OTHER): Payer: Self-pay | Admitting: General Surgery

## 2013-09-02 ENCOUNTER — Encounter (HOSPITAL_COMMUNITY): Payer: Self-pay | Admitting: Pharmacy Technician

## 2013-09-04 NOTE — Pre-Procedure Instructions (Signed)
[name]  09/04/2013   Your procedure is scheduled on:  Monday September 15, 2013 at 9:28 AM.  [facility]"  Admitting at 7:25 AM.  Call this number if you have problems the morning of surgery: [phone]   Remember:   Do not eat food or drink liquids after midnight.   Take these medicines the morning of surgery with A SIP OF WATER: Bupropion (Wellbutrin), Lorazepam (Ativan), Metoprolol (Lopressor), Mirabegron (Myrbetriq), Oxycodone (Oxy IR), Pantoprazole (Protonix), Pregabalin (Lyrica) and Tamsulosin (Flomax)   Do not wear jewelry.  Do not wear lotions, powders, or colognes. You may wear deodorant.  Men may shave face and neck.  Do not bring valuables to the hospital.  [facility] is not responsible for any belongings or valuables.               Contacts, dentures or bridgework may not be worn into surgery.  Leave suitcase in the car. After surgery it may be brought to your room.  For patients admitted to the hospital, discharge time is determined by your treatment team.               Patients discharged the day of surgery will not be allowed to drive home.  Name and phone number of your driver: Family/Friend  Special Instructions: Shower the night before and the morning of your surgery using CHG soap   Please read over the following fact sheets that you were given: Pain Booklet, Coughing and Deep Breathing and Surgical Site Infection Prevention   [facility]- Preparing For Surgery  Before surgery, you can play an important role. Because skin is not sterile, your skin needs to be as free of germs as possible. You can reduce the number of germs on your skin by washing with CHG (chlorahexidine gluconate) Soap before surgery.  CHG is an antiseptic cleaner which kills germs and bonds with the skin to continue killing germs even after washing.  Please do not use if you have an allergy to CHG or antibacterial soaps. If your skin becomes  reddened/irritated stop using the CHG.  Do not shave (including legs and underarms) for at least 48 hours prior to first CHG shower. It is OK to shave your face.  Please follow these instructions carefully.   1. Shower the night before surgery and the morning of surgery with CHG.   2. If you chose to wash your hair, wash your hair first as usual with your normal shampoo.  3. After you shampoo, rinse your hair and body thoroughly to remove the shampoo.  4. Use CHG as you would any other liquid soap. You can apply CHG directly to the skin and wash gently with a scrungie or a clean washcloth.   5. Apply the CHG Soap to your body ONLY FROM THE NECK DOWN.  Do not use on open wounds or open sores. Avoid contact with your eyes, ears, mouth and genitals (private parts). Wash genitals (private parts) with your normal soap.  6. Wash thoroughly, paying special attention to the area where your surgery will be performed.  7. Thoroughly rinse your body with warm water from the neck down.  8. DO NOT shower/wash with your normal soap after using and rinsing off the CHG Soap.  9. Pat yourself dry with a clean towel.   10. Wear clean pajamas   11. Place clean sheets on your bed the night of your first shower and do not sleep with pets.  Day of Surgery: Do not apply any deodorants/lotions. Please wear clean clothes to the hospital/surgery center.

## 2013-09-05 ENCOUNTER — Encounter (HOSPITAL_COMMUNITY)
Admission: RE | Admit: 2013-09-05 | Discharge: 2013-09-05 | Disposition: A | Discharge status: Home / Self Care | Payer: Medicare Other | Source: Ambulatory Visit | Attending: General Surgery | Admitting: General Surgery

## 2013-09-05 ENCOUNTER — Encounter (HOSPITAL_COMMUNITY): Payer: Self-pay

## 2013-09-05 ENCOUNTER — Ambulatory Visit (HOSPITAL_COMMUNITY)
Admission: RE | Admit: 2013-09-05 | Discharge: 2013-09-05 | Disposition: A | Discharge status: Home / Self Care | Payer: Medicare Other | Source: Ambulatory Visit | Attending: General Surgery | Admitting: General Surgery

## 2013-09-05 DIAGNOSIS — Z01812 Encounter for preprocedural laboratory examination: Secondary | ICD-10-CM | POA: Insufficient documentation

## 2013-09-05 DIAGNOSIS — Z01818 Encounter for other preprocedural examination: Secondary | ICD-10-CM | POA: Insufficient documentation

## 2013-09-05 LAB — BASIC METABOLIC PANEL
BUN: 43 mg/dL — AB (ref 6–23)
CO2: 29 meq/L (ref 19–32)
CREATININE: 1.63 mg/dL — AB (ref 0.50–1.35)
Calcium: 9.6 mg/dL (ref 8.4–10.5)
Chloride: 102 mEq/L (ref 96–112)
GFR, EST AFRICAN AMERICAN: 45 mL/min — AB (ref >90)
GFR, EST NON AFRICAN AMERICAN: 39 mL/min — AB (ref >90)
GLUCOSE: 92 mg/dL (ref 70–99)
Potassium: 5 mEq/L (ref 3.7–5.3)
Sodium: 143 mEq/L (ref 137–147)

## 2013-09-05 LAB — CBC
HCT: 51.4 % (ref 39.0–52.0)
HEMOGLOBIN: 17.3 g/dL — AB (ref 13.0–17.0)
MCH: 31.7 pg (ref 26.0–34.0)
MCHC: 33.7 g/dL (ref 30.0–36.0)
MCV: 94.1 fL (ref 78.0–100.0)
Platelets: 146 10*3/uL — ABNORMAL LOW (ref 150–400)
RBC: 5.46 MIL/uL (ref 4.22–5.81)
RDW: 14.9 % (ref 11.5–15.5)
WBC: 6.6 10*3/uL (ref 4.0–10.5)

## 2013-09-05 NOTE — Progress Notes (Signed)
Patient denied having any acute cardiac or respiratory issues. Patient informed Nurse that he saw Dr. Donnie Aho last week and had a EKG performed. Will request records. All encounters in [facility] with various Physicians. When asked about Plavix patient stated he was instructed to stop it a week before surgery.

## 2013-09-05 NOTE — Progress Notes (Signed)
Sherie Don, NA informed Nurse that Dinamap machine stated patients pulse was 155. Nurse checked pulse for one full minute using the radial artery on patients left wrist, and it was 55 and O2 sat was 95% on RA. Will document findings.

## 2013-09-08 ENCOUNTER — Encounter (HOSPITAL_COMMUNITY): Payer: Self-pay | Admitting: Vascular Surgery

## 2013-09-08 NOTE — Progress Notes (Signed)
Anesthesia Chart Review:  Patient is a 80 year old [name] scheduled for open left inguinal hernia repair on 09/12/13 by Dr. Derrell Lolling.  History includes non-smoker, HLD, anxiety, RLS, HTN, CAD s/p DES CX '10, mild to moderate AS by 06/2013 echo, CHF, pericarditis '07 s/p pericardial window, left carotid endarterectomy '07, GERD, insomnia, depression, OSA, right brain CVA '03 and TIA '05, BPH, lumbar stenosis, venous insufficiency. PCP is Dr. [name]. Pulmonologist is Dr. Shelle Iron.  Cardiologist is Dr. Donnie Aho who cleared patient for this procedure with permission to hold Plavix for 7 days prior to surgery.  Echo on 07/09/13 showed: Concentric LVH with normal LV systolic function, EF Mason%. Mild to moderate AS with trace AR. Mild to moderate LAE.  Trace pulmonic, tricuspid, and mitral regurgitation.  Cardiac cath on 04/15/10 showed widely patent CX stent. Mild narrowing of left main, 40-45% proximal LAD, moderate LAD stenosis, 50% ostial RCA (small vessel).  There is 60-70% mid vessel stenosis and a 60-70% more distal stenosis.  70-80% eccentric stenosis prior to a very small PDA.  Medical therapy was recommended.  EKG on 08/26/13 (Dr. Donnie Aho) showed sinus arrhythmia, LAD, slight intraventricular conduction delay.   Carotid duplex on 09/21/11 showed patent left CEA and no significant RICA stenosis.  CXR on 09/05/13 showed no evidence of acute cardiopulmonary disease.  Preoperative labs noted.  Cr 1.63, stable since at least 08/2012.  If no acute changes then I anticipate that he can proceed as planned.  Velna Ochs [facility] Short Stay Center/Anesthesiology Phone [phone] 09/08/2013 4:40 PM

## 2013-09-14 MED ORDER — CEFAZOLIN SODIUM-DEXTROSE 2-3 GM-% IV SOLR: 2.0000 g | INTRAVENOUS | Status: DC

## 2013-09-15 ENCOUNTER — Ambulatory Visit (HOSPITAL_COMMUNITY)
Admission: RE | Admit: 2013-09-15 | Discharge: 2013-09-15 | Disposition: A | Discharge status: Home / Self Care | Payer: Medicare Other | Source: Ambulatory Visit | Attending: General Surgery | Admitting: General Surgery

## 2013-09-15 ENCOUNTER — Encounter (HOSPITAL_COMMUNITY)
Admission: RE | Disposition: A | Discharge status: Home / Self Care | Payer: Self-pay | Source: Ambulatory Visit | Attending: General Surgery

## 2013-09-15 SURGERY — REPAIR, HERNIA, INGUINAL, ADULT
Anesthesia: Choice

## 2013-09-15 SURGICAL SUPPLY — 48 items
ADH SKN CLS APL DERMABOND .7 (GAUZE/BANDAGES/DRESSINGS) ×1
BLADE SURG 10 STRL SS (BLADE) ×4 IMPLANT
BLADE SURG 15 STRL LF DISP TIS (BLADE) ×2 IMPLANT
BLADE SURG 15 STRL SS (BLADE) ×3
BLADE SURG ROTATE 9660 (MISCELLANEOUS) IMPLANT
CHLORAPREP W/TINT 26ML (MISCELLANEOUS) ×4 IMPLANT
COVER SURGICAL LIGHT HANDLE (MISCELLANEOUS) ×4 IMPLANT
DERMABOND ADVANCED (GAUZE/BANDAGES/DRESSINGS) ×2
DERMABOND ADVANCED .7 DNX12 (GAUZE/BANDAGES/DRESSINGS) ×2 IMPLANT
DRAIN PENROSE 1/2X12 LTX STRL (WOUND CARE) IMPLANT
DRAPE LAPAROTOMY TRNSV 102X78 (DRAPE) ×4 IMPLANT
DRAPE UTILITY 15X26 W/TAPE STR (DRAPE) ×8 IMPLANT
ELECT CAUTERY BLADE 6.4 (BLADE) ×4 IMPLANT
ELECT REM PT RETURN 9FT ADLT (ELECTROSURGICAL) ×3
ELECTRODE REM PT RTRN 9FT ADLT (ELECTROSURGICAL) ×2 IMPLANT
GAUZE SPONGE 4X4 16PLY XRAY LF (GAUZE/BANDAGES/DRESSINGS) ×4 IMPLANT
GLOVE BIO SURGEON STRL SZ7.5 (GLOVE) ×4 IMPLANT
GLOVE BIOGEL PI IND STRL 8 (GLOVE) ×2 IMPLANT
GLOVE BIOGEL PI INDICATOR 8 (GLOVE) ×2
GOWN STRL NON-REIN LRG LVL3 (GOWN DISPOSABLE) ×4 IMPLANT
GOWN STRL REIN XL XLG (GOWN DISPOSABLE) ×4 IMPLANT
KIT BASIN OR (CUSTOM PROCEDURE TRAY) ×4 IMPLANT
KIT ROOM TURNOVER OR (KITS) ×4 IMPLANT
NDL HYPO 25GX1X1/2 BEV (NEEDLE) ×1 IMPLANT
NEEDLE HYPO 25GX1X1/2 BEV (NEEDLE) ×3 IMPLANT
NS IRRIG 1000ML POUR BTL (IV SOLUTION) ×4 IMPLANT
PACK SURGICAL SETUP 50X90 (CUSTOM PROCEDURE TRAY) ×4 IMPLANT
PAD ARMBOARD 7.5X6 YLW CONV (MISCELLANEOUS) ×4 IMPLANT
PENCIL BUTTON HOLSTER BLD 10FT (ELECTRODE) ×4 IMPLANT
SPECIMEN JAR SMALL (MISCELLANEOUS) IMPLANT
SPONGE INTESTINAL PEANUT (DISPOSABLE) IMPLANT
SPONGE LAP 18X18 X RAY DECT (DISPOSABLE) ×4 IMPLANT
SUT MNCRL AB 4-0 PS2 18 (SUTURE) ×4 IMPLANT
SUT PDS AB 0 CT 36 (SUTURE) IMPLANT
SUT PROLENE 2 0 SH DA (SUTURE) ×4 IMPLANT
SUT VIC AB 0 CT2 27 (SUTURE) IMPLANT
SUT VIC AB 2-0 SH 27 (SUTURE) ×3
SUT VIC AB 2-0 SH 27X BRD (SUTURE) ×2 IMPLANT
SUT VIC AB 3-0 SH 27 (SUTURE) ×3
SUT VIC AB 3-0 SH 27XBRD (SUTURE) ×2 IMPLANT
SUT VICRYL AB 2 0 TIES (SUTURE) ×4 IMPLANT
SYR CONTROL 10ML LL (SYRINGE) ×4 IMPLANT
TOWEL OR 17X24 6PK STRL BLUE (TOWEL DISPOSABLE) ×4 IMPLANT
TOWEL OR 17X26 10 PK STRL BLUE (TOWEL DISPOSABLE) ×4 IMPLANT
TRAY FOLEY CATH 16FR SILVER (SET/KITS/TRAYS/PACK) IMPLANT
TUBE CONNECTING 12'X1/4 (SUCTIONS)
TUBE CONNECTING 12X1/4 (SUCTIONS) IMPLANT
YANKAUER SUCT BULB TIP NO VENT (SUCTIONS) IMPLANT

## 2013-09-22 ENCOUNTER — Other Ambulatory Visit: Payer: Self-pay | Admitting: Internal Medicine

## 2013-09-23 ENCOUNTER — Encounter (HOSPITAL_COMMUNITY): Payer: Self-pay

## 2013-09-23 ENCOUNTER — Encounter (HOSPITAL_COMMUNITY): Payer: Self-pay | Admitting: Pharmacy Technician

## 2013-09-23 ENCOUNTER — Encounter (HOSPITAL_COMMUNITY)
Admission: RE | Admit: 2013-09-23 | Discharge: 2013-09-23 | Disposition: A | Discharge status: Home / Self Care | Payer: Medicare Other | Source: Ambulatory Visit | Attending: General Surgery | Admitting: General Surgery

## 2013-09-23 DIAGNOSIS — Z01812 Encounter for preprocedural laboratory examination: Secondary | ICD-10-CM | POA: Insufficient documentation

## 2013-09-23 HISTORY — DX: Depression, unspecified: F32.A

## 2013-09-23 HISTORY — DX: Anxiety disorder, unspecified: F41.9

## 2013-09-23 HISTORY — DX: Major depressive disorder, single episode, unspecified: F32.9

## 2013-09-23 LAB — BASIC METABOLIC PANEL
BUN: 38 mg/dL — ABNORMAL HIGH (ref 6–23)
CHLORIDE: 97 meq/L (ref 96–112)
CO2: 31 mEq/L (ref 19–32)
CREATININE: 1.61 mg/dL — AB (ref 0.50–1.35)
Calcium: 9.8 mg/dL (ref 8.4–10.5)
GFR calc non Af Amer: 39 mL/min — ABNORMAL LOW (ref >90)
GFR, EST AFRICAN AMERICAN: 46 mL/min — AB (ref >90)
Glucose, Bld: 121 mg/dL — ABNORMAL HIGH (ref 70–99)
Potassium: 5 mEq/L (ref 3.7–5.3)
SODIUM: 140 meq/L (ref 137–147)

## 2013-09-23 LAB — CBC
HCT: 52 % (ref 39.0–52.0)
Hemoglobin: 17.2 g/dL — ABNORMAL HIGH (ref 13.0–17.0)
MCH: 30.9 pg (ref 26.0–34.0)
MCHC: 33.1 g/dL (ref 30.0–36.0)
MCV: 93.4 fL (ref 78.0–100.0)
PLATELETS: 141 10*3/uL — AB (ref 150–400)
RBC: 5.57 MIL/uL (ref 4.22–5.81)
RDW: 14.7 % (ref 11.5–15.5)
WBC: 6.2 10*3/uL (ref 4.0–10.5)

## 2013-09-23 NOTE — Progress Notes (Signed)
Need orders for surgery 10/01/13 please

## 2013-09-23 NOTE — Progress Notes (Signed)
LOV note Dr. Donnie Aho 08/26/13 on chart, EKG 08/26/13 on chart, ECHO 2014 on chart, Chest x-ray 09/05/13 on EPIC

## 2013-09-23 NOTE — Patient Instructions (Addendum)
20 [name]  09/23/2013   Your procedure is scheduled on: 10/01/13  Report to [facility] at 6:30 AM.  Call this number if you have problems the morning of surgery [phone]: [phone]   Remember:   Do not eat food or drink liquids After Midnight.     Take these medicines the morning of surgery with A SIP OF WATER: Bupropion, larazepam, metoprolol, oxycodone if needed, pantoprazole   Do not wear jewelry, make-up or nail polish.  Do not wear lotions, powders, or perfumes. You may wear deodorant.  Do not shave 48 hours prior to surgery. Men may shave face and neck.  Do not bring valuables to the hospital.  Contacts, dentures or bridgework may not be worn into surgery.   Patients discharged the day of surgery will not be allowed to drive home.  Name and phone number of your driver: Stanton Kidney [phone]     Please read over the following fact sheets that you were given:[place] preparing for surgery sheet Birdie Sons, RN  pre op nurse call if needed [phone]    FAILURE TO FOLLOW THESE INSTRUCTIONS MAY RESULT IN CANCELLATION OF YOUR SURGERY   Patient Signature: ___________________________________________

## 2013-09-24 ENCOUNTER — Telehealth (INDEPENDENT_AMBULATORY_CARE_PROVIDER_SITE_OTHER): Payer: Self-pay

## 2013-09-24 ENCOUNTER — Other Ambulatory Visit (INDEPENDENT_AMBULATORY_CARE_PROVIDER_SITE_OTHER): Payer: Self-pay | Admitting: General Surgery

## 2013-09-24 NOTE — Telephone Encounter (Signed)
Office notes from 08/25/13 with Dr. Derrell Lolling faxed to [facility] attn: Dr. Brunilda Payor

## 2013-09-25 ENCOUNTER — Encounter (INDEPENDENT_AMBULATORY_CARE_PROVIDER_SITE_OTHER): Payer: Medicare Other | Admitting: General Surgery

## 2013-10-01 ENCOUNTER — Encounter (HOSPITAL_COMMUNITY): Payer: Medicare Other | Admitting: Anesthesiology

## 2013-10-01 ENCOUNTER — Encounter (HOSPITAL_COMMUNITY)
Admission: RE | Disposition: A | Discharge status: Home / Self Care | Payer: Self-pay | Source: Ambulatory Visit | Attending: General Surgery

## 2013-10-01 ENCOUNTER — Ambulatory Visit (HOSPITAL_COMMUNITY)
Admission: RE | Admit: 2013-10-01 | Discharge: 2013-10-01 | Disposition: A | Discharge status: Home / Self Care | Payer: Medicare Other | Source: Ambulatory Visit | Attending: General Surgery | Admitting: General Surgery

## 2013-10-01 ENCOUNTER — Ambulatory Visit (HOSPITAL_COMMUNITY): Payer: Medicare Other | Admitting: Anesthesiology

## 2013-10-01 ENCOUNTER — Encounter (HOSPITAL_COMMUNITY): Payer: Self-pay | Admitting: *Deleted

## 2013-10-01 DIAGNOSIS — Z9849 Cataract extraction status, unspecified eye: Secondary | ICD-10-CM | POA: Insufficient documentation

## 2013-10-01 DIAGNOSIS — I509 Heart failure, unspecified: Secondary | ICD-10-CM | POA: Insufficient documentation

## 2013-10-01 DIAGNOSIS — E785 Hyperlipidemia, unspecified: Secondary | ICD-10-CM | POA: Insufficient documentation

## 2013-10-01 DIAGNOSIS — I10 Essential (primary) hypertension: Secondary | ICD-10-CM | POA: Insufficient documentation

## 2013-10-01 DIAGNOSIS — K4090 Unilateral inguinal hernia, without obstruction or gangrene, not specified as recurrent: Secondary | ICD-10-CM | POA: Insufficient documentation

## 2013-10-01 DIAGNOSIS — K219 Gastro-esophageal reflux disease without esophagitis: Secondary | ICD-10-CM | POA: Insufficient documentation

## 2013-10-01 DIAGNOSIS — Z7982 Long term (current) use of aspirin: Secondary | ICD-10-CM | POA: Insufficient documentation

## 2013-10-01 DIAGNOSIS — Z79899 Other long term (current) drug therapy: Secondary | ICD-10-CM | POA: Insufficient documentation

## 2013-10-01 DIAGNOSIS — I252 Old myocardial infarction: Secondary | ICD-10-CM | POA: Insufficient documentation

## 2013-10-01 DIAGNOSIS — F329 Major depressive disorder, single episode, unspecified: Secondary | ICD-10-CM | POA: Insufficient documentation

## 2013-10-01 DIAGNOSIS — G4700 Insomnia, unspecified: Secondary | ICD-10-CM | POA: Insufficient documentation

## 2013-10-01 DIAGNOSIS — Z8673 Personal history of transient ischemic attack (TIA), and cerebral infarction without residual deficits: Secondary | ICD-10-CM | POA: Insufficient documentation

## 2013-10-01 DIAGNOSIS — F411 Generalized anxiety disorder: Secondary | ICD-10-CM | POA: Insufficient documentation

## 2013-10-01 DIAGNOSIS — Z9861 Coronary angioplasty status: Secondary | ICD-10-CM | POA: Insufficient documentation

## 2013-10-01 DIAGNOSIS — M109 Gout, unspecified: Secondary | ICD-10-CM | POA: Insufficient documentation

## 2013-10-01 DIAGNOSIS — E669 Obesity, unspecified: Secondary | ICD-10-CM | POA: Insufficient documentation

## 2013-10-01 DIAGNOSIS — N400 Enlarged prostate without lower urinary tract symptoms: Secondary | ICD-10-CM | POA: Insufficient documentation

## 2013-10-01 DIAGNOSIS — I2510 Atherosclerotic heart disease of native coronary artery without angina pectoris: Secondary | ICD-10-CM | POA: Insufficient documentation

## 2013-10-01 DIAGNOSIS — G4733 Obstructive sleep apnea (adult) (pediatric): Secondary | ICD-10-CM | POA: Insufficient documentation

## 2013-10-01 DIAGNOSIS — F3289 Other specified depressive episodes: Secondary | ICD-10-CM | POA: Insufficient documentation

## 2013-10-01 DIAGNOSIS — G2581 Restless legs syndrome: Secondary | ICD-10-CM | POA: Insufficient documentation

## 2013-10-01 DIAGNOSIS — I872 Venous insufficiency (chronic) (peripheral): Secondary | ICD-10-CM | POA: Insufficient documentation

## 2013-10-01 HISTORY — PX: INSERTION OF MESH: SHX5868

## 2013-10-01 HISTORY — PX: INGUINAL HERNIA REPAIR: SHX194

## 2013-10-01 LAB — GLUCOSE, CAPILLARY: Glucose-Capillary: 122 mg/dL — ABNORMAL HIGH (ref 70–99)

## 2013-10-01 SURGERY — REPAIR, HERNIA, INGUINAL, ADULT
Anesthesia: General | Laterality: Left

## 2013-10-01 MED ORDER — PROMETHAZINE HCL 25 MG/ML IJ SOLN: 6.2500 mg | INTRAMUSCULAR | Status: DC | PRN

## 2013-10-01 MED ORDER — OXYCODONE HCL 5 MG PO TABS
5.0000 mg | ORAL_TABLET | ORAL | Status: DC | PRN
Start: 2013-10-01 — End: 2013-10-01

## 2013-10-01 MED ORDER — CEFAZOLIN SODIUM-DEXTROSE 2-3 GM-% IV SOLR
2.0000 g | INTRAVENOUS | Status: AC
  Administered 2013-10-01: 2 g via INTRAVENOUS

## 2013-10-01 MED ORDER — SODIUM CHLORIDE 0.9 % IJ SOLN: 3.0000 mL | Freq: Two times a day (BID) | INTRAMUSCULAR | Status: DC

## 2013-10-01 MED ORDER — EPHEDRINE SULFATE 50 MG/ML IJ SOLN
INTRAMUSCULAR | Status: AC
  Filled 2013-10-01: qty 1

## 2013-10-01 MED ORDER — PHENYLEPHRINE HCL 10 MG/ML IJ SOLN
INTRAMUSCULAR | Status: DC | PRN
  Administered 2013-10-01: 80 ug via INTRAVENOUS

## 2013-10-01 MED ORDER — GLYCOPYRROLATE 0.2 MG/ML IJ SOLN
INTRAMUSCULAR | Status: AC
  Filled 2013-10-01: qty 3

## 2013-10-01 MED ORDER — SODIUM CHLORIDE 0.9 % IV SOLN: 250.0000 mL | INTRAVENOUS | Status: DC | PRN

## 2013-10-01 MED ORDER — SUCCINYLCHOLINE CHLORIDE 20 MG/ML IJ SOLN
INTRAMUSCULAR | Status: DC | PRN
  Administered 2013-10-01: 100 mg via INTRAVENOUS

## 2013-10-01 MED ORDER — PHENYLEPHRINE 40 MCG/ML (10ML) SYRINGE FOR IV PUSH (FOR BLOOD PRESSURE SUPPORT)
PREFILLED_SYRINGE | INTRAVENOUS | Status: AC
  Filled 2013-10-01: qty 10

## 2013-10-01 MED ORDER — NEOSTIGMINE METHYLSULFATE 1 MG/ML IJ SOLN
INTRAMUSCULAR | Status: DC | PRN
  Administered 2013-10-01: 4 mg via INTRAVENOUS

## 2013-10-01 MED ORDER — HYDROMORPHONE HCL PF 1 MG/ML IJ SOLN: 0.2500 mg | INTRAMUSCULAR | Status: DC | PRN

## 2013-10-01 MED ORDER — CHLORHEXIDINE GLUCONATE 4 % EX LIQD: 1.0000 "application " | Freq: Once | CUTANEOUS | Status: DC

## 2013-10-01 MED ORDER — BUPIVACAINE-EPINEPHRINE 0.25% -1:200000 IJ SOLN
INTRAMUSCULAR | Status: DC | PRN
  Administered 2013-10-01: 10 mL

## 2013-10-01 MED ORDER — OXYCODONE-ACETAMINOPHEN 5-325 MG PO TABS: 1.0000 | ORAL_TABLET | ORAL | Status: DC | PRN

## 2013-10-01 MED ORDER — LACTATED RINGERS IV SOLN
INTRAVENOUS | Status: DC | PRN
  Administered 2013-10-01: 08:00:00 via INTRAVENOUS

## 2013-10-01 MED ORDER — FENTANYL CITRATE 0.05 MG/ML IJ SOLN
INTRAMUSCULAR | Status: AC
  Filled 2013-10-01: qty 5

## 2013-10-01 MED ORDER — ONDANSETRON HCL 4 MG/2ML IJ SOLN
INTRAMUSCULAR | Status: AC
  Filled 2013-10-01: qty 2

## 2013-10-01 MED ORDER — PROPOFOL 10 MG/ML IV BOLUS
INTRAVENOUS | Status: AC
  Filled 2013-10-01: qty 20

## 2013-10-01 MED ORDER — CEFAZOLIN SODIUM-DEXTROSE 2-3 GM-% IV SOLR
INTRAVENOUS | Status: AC
  Filled 2013-10-01: qty 50

## 2013-10-01 MED ORDER — OXYCODONE HCL 5 MG/5ML PO SOLN
5.0000 mg | Freq: Once | ORAL | Status: AC | PRN
  Filled 2013-10-01: qty 5

## 2013-10-01 MED ORDER — ONDANSETRON HCL 4 MG/2ML IJ SOLN: 4.0000 mg | Freq: Four times a day (QID) | INTRAMUSCULAR | Status: DC | PRN

## 2013-10-01 MED ORDER — ROCURONIUM BROMIDE 100 MG/10ML IV SOLN
INTRAVENOUS | Status: DC | PRN
  Administered 2013-10-01: 40 mg via INTRAVENOUS
  Administered 2013-10-01: 10 mg via INTRAVENOUS

## 2013-10-01 MED ORDER — PROPOFOL 10 MG/ML IV BOLUS
INTRAVENOUS | Status: DC | PRN
  Administered 2013-10-01: 140 mg via INTRAVENOUS

## 2013-10-01 MED ORDER — BUPIVACAINE-EPINEPHRINE PF 0.25-1:200000 % IJ SOLN
INTRAMUSCULAR | Status: AC
  Filled 2013-10-01: qty 30

## 2013-10-01 MED ORDER — LIDOCAINE HCL 1 % IJ SOLN
INTRAMUSCULAR | Status: AC
  Filled 2013-10-01: qty 20

## 2013-10-01 MED ORDER — ATROPINE SULFATE 0.4 MG/ML IJ SOLN
INTRAMUSCULAR | Status: AC
  Filled 2013-10-01: qty 1

## 2013-10-01 MED ORDER — SODIUM CHLORIDE 0.9 % IJ SOLN
INTRAMUSCULAR | Status: AC
  Filled 2013-10-01: qty 10

## 2013-10-01 MED ORDER — MEPERIDINE HCL 50 MG/ML IJ SOLN: 6.2500 mg | INTRAMUSCULAR | Status: DC | PRN

## 2013-10-01 MED ORDER — GLYCOPYRROLATE 0.2 MG/ML IJ SOLN
INTRAMUSCULAR | Status: DC | PRN
  Administered 2013-10-01: 0.6 mg via INTRAVENOUS

## 2013-10-01 MED ORDER — ONDANSETRON HCL 4 MG/2ML IJ SOLN
INTRAMUSCULAR | Status: DC | PRN
  Administered 2013-10-01: 4 mg via INTRAVENOUS

## 2013-10-01 MED ORDER — ROCURONIUM BROMIDE 100 MG/10ML IV SOLN
INTRAVENOUS | Status: AC
  Filled 2013-10-01: qty 1

## 2013-10-01 MED ORDER — FENTANYL CITRATE 0.05 MG/ML IJ SOLN
INTRAMUSCULAR | Status: DC | PRN
  Administered 2013-10-01 (×2): 50 ug via INTRAVENOUS
  Administered 2013-10-01: 100 ug via INTRAVENOUS

## 2013-10-01 MED ORDER — ACETAMINOPHEN 650 MG RE SUPP
650.0000 mg | RECTAL | Status: DC | PRN
  Filled 2013-10-01: qty 1

## 2013-10-01 MED ORDER — LIDOCAINE HCL (PF) 2 % IJ SOLN
INTRAMUSCULAR | Status: DC | PRN
  Administered 2013-10-01: 75 mg via INTRADERMAL

## 2013-10-01 MED ORDER — SODIUM CHLORIDE 0.9 % IJ SOLN: 3.0000 mL | INTRAMUSCULAR | Status: DC | PRN

## 2013-10-01 MED ORDER — ACETAMINOPHEN 325 MG PO TABS: 650.0000 mg | ORAL_TABLET | ORAL | Status: DC | PRN

## 2013-10-01 MED ORDER — OXYCODONE HCL 5 MG PO TABS
5.0000 mg | ORAL_TABLET | Freq: Once | ORAL | Status: AC | PRN
  Administered 2013-10-01: 5 mg via ORAL
  Filled 2013-10-01: qty 1

## 2013-10-01 SURGICAL SUPPLY — 44 items
ADH SKN CLS APL DERMABOND .7 (GAUZE/BANDAGES/DRESSINGS) ×1
APL SKNCLS STERI-STRIP NONHPOA (GAUZE/BANDAGES/DRESSINGS)
BENZOIN TINCTURE PRP APPL 2/3 (GAUZE/BANDAGES/DRESSINGS) ×1 IMPLANT
BLADE HEX COATED 2.75 (ELECTRODE) ×3 IMPLANT
BLADE SURG 15 STRL LF DISP TIS (BLADE) ×1 IMPLANT
BLADE SURG 15 STRL SS (BLADE) ×3
CANISTER SUCTION 2500CC (MISCELLANEOUS) ×1 IMPLANT
CLOSURE WOUND 1/2 X4 (GAUZE/BANDAGES/DRESSINGS)
DECANTER SPIKE VIAL GLASS SM (MISCELLANEOUS) ×1 IMPLANT
DERMABOND ADVANCED (GAUZE/BANDAGES/DRESSINGS) ×2
DERMABOND ADVANCED .7 DNX12 (GAUZE/BANDAGES/DRESSINGS) IMPLANT
DRAIN PENROSE 18X1/2 LTX STRL (DRAIN) ×3 IMPLANT
DRAPE LAPAROSCOPIC ABDOMINAL (DRAPES) ×3 IMPLANT
ELECT REM PT RETURN 9FT ADLT (ELECTROSURGICAL) ×3
ELECTRODE REM PT RTRN 9FT ADLT (ELECTROSURGICAL) ×1 IMPLANT
GLOVE BIO SURGEON STRL SZ7.5 (GLOVE) ×16 IMPLANT
GLOVE BIOGEL PI IND STRL 7.0 (GLOVE) ×1 IMPLANT
GLOVE BIOGEL PI INDICATOR 7.0 (GLOVE) ×2
GOWN STRL REUS W/ TWL XL LVL3 (GOWN DISPOSABLE) ×1 IMPLANT
GOWN STRL REUS W/TWL LRG LVL3 (GOWN DISPOSABLE) ×3 IMPLANT
GOWN STRL REUS W/TWL XL LVL3 (GOWN DISPOSABLE) ×4 IMPLANT
KIT BASIN OR (CUSTOM PROCEDURE TRAY) ×3 IMPLANT
MESH PARIETEX PROGRIP LEFT (Mesh General) ×2 IMPLANT
NDL HYPO 25X1 1.5 SAFETY (NEEDLE) ×1 IMPLANT
NEEDLE HYPO 25X1 1.5 SAFETY (NEEDLE) ×3 IMPLANT
NS IRRIG 1000ML POUR BTL (IV SOLUTION) ×3 IMPLANT
PACK BASIC VI WITH GOWN DISP (CUSTOM PROCEDURE TRAY) ×3 IMPLANT
PENCIL BUTTON HOLSTER BLD 10FT (ELECTRODE) ×3 IMPLANT
SPONGE GAUZE 4X4 12PLY (GAUZE/BANDAGES/DRESSINGS) ×3 IMPLANT
SPONGE LAP 18X18 X RAY DECT (DISPOSABLE) ×1 IMPLANT
STRIP CLOSURE SKIN 1/2X4 (GAUZE/BANDAGES/DRESSINGS) ×1 IMPLANT
SUT MNCRL AB 4-0 PS2 18 (SUTURE) ×3 IMPLANT
SUT PROLENE 2 0 SH DA (SUTURE) ×6 IMPLANT
SUT SILK 2 0 SH (SUTURE) IMPLANT
SUT SILK 3 0 (SUTURE)
SUT SILK 3-0 18XBRD TIE 12 (SUTURE) IMPLANT
SUT VIC AB 2-0 SH 27 (SUTURE) ×9
SUT VIC AB 2-0 SH 27X BRD (SUTURE) ×2 IMPLANT
SUT VIC AB 3-0 SH 27 (SUTURE) ×3
SUT VIC AB 3-0 SH 27XBRD (SUTURE) ×2 IMPLANT
SYR BULB IRRIGATION 50ML (SYRINGE) ×3 IMPLANT
SYR CONTROL 10ML LL (SYRINGE) ×3 IMPLANT
TOWEL OR 17X26 10 PK STRL BLUE (TOWEL DISPOSABLE) ×3 IMPLANT
YANKAUER SUCT BULB TIP 10FT TU (MISCELLANEOUS) ×3 IMPLANT

## 2013-10-01 NOTE — Transfer of Care (Signed)
Immediate Anesthesia Transfer of Care Note  Patient: [name]  Procedure(s) Performed: Procedure(s): HERNIA REPAIR INGUINAL ADULT (Left) INSERTION OF MESH (Left)  Patient Location: PACU  Anesthesia Type:General  Level of Consciousness: awake, sedated and patient cooperative  Airway & Oxygen Therapy: Patient Spontanous Breathing and Patient connected to face mask oxygen  Post-op Assessment: Report given to PACU RN and Post -op Vital signs reviewed and stable  Post vital signs: Reviewed and stable  Complications: No apparent anesthesia complications

## 2013-10-01 NOTE — H&P (Signed)
HPI  [name] is a 80 y.o. male. The patient is a 80 year old male who is referred by Dr. Felicity Coyer for evaluation of a large left inguinal hernia. The patient states he notices there for the last approximately 3-4 months. He states it causes him discomfort and minimal pain. The patient states that it does not self reduce. Patient said no signs or symptoms of incarceration or strangulation.  HPI  Past Medical History   Diagnosis  Date   .  Gout, unspecified      severe dz   .  HYPERLIPIDEMIA      intolerant of statins   .  OBESITY    .  Anxiety state, unspecified    .  RESTLESS LEG SYNDROME    .  DETACHMENT, RETINAL NOS  1994   .  CATARACT NOS  2008   .  HYPERTENSION    .  MYOCARDIAL INFARCTION  2004, 03/2010   .  CORONARY ARTERY DISEASE    .  PERICARDITIS, ACUTE NEC  2007     MSSA, s/p pericardial window   .  CHF    .  GERD    .  DEGENERATIVE JOINT DISEASE    .  CAROTID ENDARTERECTOMY, LEFT, HX OF    .  INSOMNIA, CHRONIC    .  DEPRESSION    .  OBSTRUCTIVE SLEEP APNEA      CPAP qhs   .  TIA (transient ischemic attack)  2005   .  BPH (benign prostatic hypertrophy)    .  Lumbar spinal stenosis    .  Carotid artery occlusion    .  Stroke  May 2007   .  Ulcer    .  Unspecified venous (peripheral) insufficiency     Past Surgical History   Procedure  Laterality  Date   .  Coronary angioplasty   01/2009   .  Pericardial window   2007   .  Left arm   2008   .  Right arm     .  Cataract extraction       Left side x's 2 and right   .  Retinal detachment surgery       left side   .  Shoulder surgery       bilateral   .  Left foot     .  Eye surgery     .  Carotid endarterectomy  Left  12-06-05     cea    Family History   Problem  Relation  Age of Onset   .  Heart disease  Mother    .  Diabetes  Father    .  Heart disease  Father    .  Breast cancer  Sister    .  Cancer  Sister    .  Prostate cancer  Brother    .  Lung cancer  Brother    .  Cancer  Brother     .  Diabetes  Daughter    .  Diabetes  Son    Social History  History   Substance Use Topics   .  Smoking status:  Never Smoker   .  Smokeless tobacco:  Never Used      Comment: Pt is widowed and lives alone. Pt has children. Retired from [employer]   .  Alcohol Use:  No    Allergies   Allergen  Reactions   .  Atorvastatin  REACTION: tol simvastatin ok per pt ( Pt. Says ALL the Statins )    Current Outpatient Prescriptions   Medication  Sig  Dispense  Refill   .  allopurinol (ZYLOPRIM) 300 MG tablet  Take 1 tablet (300 mg total) by mouth daily.  30 tablet  5   .  aspirin 81 MG tablet  Take 81 mg by mouth daily.     Marland Kitchen  buPROPion (WELLBUTRIN XL) 150 MG 24 hr tablet  Take 1 tablet (150 mg total) by mouth daily.  90 tablet  2   .  cholecalciferol (VITAMIN D) 1000 UNITS tablet  Take 1,000 Units by mouth daily.     .  clopidogrel (PLAVIX) 75 MG tablet  Take 1 tablet (75 mg total) by mouth daily.  90 tablet  1   .  Ferrous Sulfate (IRON) 28 MG TABS  Take by mouth daily.     .  furosemide (LASIX) 40 MG tablet  Take 2 tablets (80 mg total) by mouth 2 (two) times daily.  120 tablet  0   .  LORazepam (ATIVAN) 1 MG tablet  Take 1 tablet (1 mg total) by mouth every 8 (eight) hours as needed for anxiety.  90 tablet  1   .  Magnesium 250 MG TABS  Take by mouth daily.     .  metoprolol (LOPRESSOR) 50 MG tablet  Take 1 tablet (50 mg total) by mouth 2 (two) times daily.  60 tablet  0   .  mirtazapine (REMERON) 15 MG tablet  Take 1 tablet (15 mg total) by mouth at bedtime.  30 tablet  0   .  nitroGLYCERIN (NITROSTAT) 0.4 MG SL tablet  Place 0.4 mg under the tongue every 5 (five) minutes as needed. For chest pain     .  Omega-3 Fatty Acids (FISH OIL) 1200 MG CAPS  Take by mouth daily.     Marland Kitchen  oxycodone (OXY-IR) 5 MG capsule  Take 1 capsule (5 mg total) by mouth every 12 (twelve) hours as needed for pain.  60 capsule  0   .  pantoprazole (PROTONIX) 40 MG tablet  Take 1 tablet (40 mg total)  by mouth daily.  30 tablet  5   .  potassium chloride SA (K-DUR,KLOR-CON) 20 MEQ tablet  Take 1 tablet (20 mEq total) by mouth 2 (two) times daily.  180 tablet  1   .  pregabalin (LYRICA) 100 MG capsule  Take 1 capsule (100 mg total) by mouth 3 (three) times daily.  270 capsule  3   .  rOPINIRole (REQUIP) 4 MG tablet  Take 1 tablet (4 mg total) by mouth at bedtime.  30 tablet  0   .  solifenacin (VESICARE) 5 MG tablet  Take 1 tablet (5 mg total) by mouth daily.  30 tablet  0   .  tamsulosin (FLOMAX) 0.4 MG CAPS capsule  Take 1 capsule (0.4 mg total) by mouth daily.  30 capsule  3    No current facility-administered medications for this visit.   Review of Systems  Review of Systems  Constitutional: Negative.  HENT: Negative.  Eyes: Negative.  Respiratory: Negative.  Cardiovascular: Negative.  Gastrointestinal: Negative.  Endocrine: Negative.  Neurological: Negative.  Blood pressure 114/82, pulse 76, temperature 98 F (36.7 C), resp. rate 18, height 5\' 11"  (1.803 m), weight 240 lb (108.863 kg).  Physical Exam  Physical Exam  Constitutional: He is oriented to person, place, and time. He appears well-developed  and well-nourished.  HENT:  Head: Normocephalic and atraumatic.  Eyes: Conjunctivae and EOM are normal. Pupils are equal, round, and reactive to light.  Neck: Normal range of motion. Neck supple.  Cardiovascular: Normal rate, regular rhythm and normal heart sounds.  Pulmonary/Chest: Effort normal and breath sounds normal.  Abdominal: Soft. Bowel sounds are normal. He exhibits no distension and no mass. There is no tenderness. There is no rebound and no guarding. A hernia is present. Hernia confirmed positive in the left inguinal area. Hernia confirmed negative in the right inguinal area.  Musculoskeletal: Normal range of motion.  Neurological: He is alert and oriented to person, place, and time.  Skin: Skin is warm and dry.  Data Reviewed  none  Assessment  80 year old male with  a large left inguinal hernia. Patient will see Dr. Donnie Ahoilley first cardiac issues and is on Plavix.  Plan  1. We'll proceed to the operating room for a open left inguinal hernia repair with mesh.  2. All risks and benefits were discussed with the patient, to generally include infection, bleeding, damage to surrounding structures, acute and chronic nerve pain, and recurrence. Alternatives were offered and described. All questions were answered and the patient voiced understanding of the procedure and wishes to proceed at this point.

## 2013-10-01 NOTE — Anesthesia Postprocedure Evaluation (Signed)
Anesthesia Post Note  Patient: [name]  Procedure(s) Performed: Procedure(s) (LRB): HERNIA REPAIR INGUINAL ADULT (Left) INSERTION OF MESH (Left)  Anesthesia type: General  Patient location: PACU  Post pain: Pain level controlled  Post assessment: Post-op Vital signs reviewed  Last Vitals: BP 166/77  Pulse 66  Temp(Src) 36.3 C (Oral)  Resp 18  Ht 5\' 9"  (1.753 m)  Wt 240 lb (108.863 kg)  BMI 35.43 kg/m2  SpO2 98%  Post vital signs: Reviewed  Level of consciousness: sedated  Complications: No apparent anesthesia complications

## 2013-10-01 NOTE — Anesthesia Preprocedure Evaluation (Signed)
Anesthesia Evaluation  Patient identified by MRN, date of birth, ID band Patient awake    Reviewed: Allergy & Precautions, H&P , NPO status , Patient's Chart, lab work & pertinent test results, reviewed documented beta blocker date and time   Airway Mallampati: II TM Distance: >3 FB Neck ROM: Full    Dental  (+) Dental Advisory Given   Pulmonary shortness of breath and with exertion, sleep apnea ,  breath sounds clear to auscultation        Cardiovascular hypertension, Pt. on medications and Pt. on home beta blockers + CAD, + Past MI and + Peripheral Vascular Disease + Valvular Problems/Murmurs Rhythm:Regular Rate:Normal     Neuro/Psych PSYCHIATRIC DISORDERS Anxiety Depression TIACVA negative neurological ROS     GI/Hepatic Neg liver ROS, GERD-  Medicated,  Endo/Other  diabetes, Type 2, Oral Hypoglycemic Agents  Renal/GU negative Renal ROS     Musculoskeletal negative musculoskeletal ROS (+)   Abdominal (+) + obese,   Peds  Hematology negative hematology ROS (+)   Anesthesia Other Findings   Reproductive/Obstetrics negative OB ROS                           Anesthesia Physical Anesthesia Plan  ASA: III  Anesthesia Plan: General   Post-op Pain Management:    Induction: Intravenous  Airway Management Planned: Oral ETT  Additional Equipment:   Intra-op Plan:   Post-operative Plan: Extubation in OR  Informed Consent: I have reviewed the patients History and Physical, chart, labs and discussed the procedure including the risks, benefits and alternatives for the proposed anesthesia with the patient or authorized representative who has indicated his/her understanding and acceptance.   Dental advisory given  Plan Discussed with: CRNA  Anesthesia Plan Comments:         Anesthesia Quick Evaluation

## 2013-10-01 NOTE — Discharge Instructions (Signed)
[facility] _____[facility]  UMBILICAL OR INGUINAL HERNIA REPAIR: POST OP INSTRUCTIONS  Always review your discharge instruction sheet given to you by the facility where your surgery was performed. IF YOU HAVE DISABILITY OR FAMILY LEAVE FORMS, YOU MUST BRING THEM TO THE OFFICE FOR PROCESSING.   DO NOT GIVE THEM TO YOUR DOCTOR.  1. A  prescription for pain medication may be given to you upon discharge.  Take your pain medication as prescribed, if needed.  If narcotic pain medicine is not needed, then you may take acetaminophen (Tylenol) or ibuprofen (Advil) as needed. 2. Take your usually prescribed medications unless otherwise directed. 3. If you need a refill on your pain medication, please contact your pharmacy.  They will contact our office to request authorization. Prescriptions will not be filled after 5 pm or on week-ends. 4. You should follow a light diet the first 24 hours after arrival home, such as soup and crackers, etc.  Be sure to include lots of fluids daily.  Resume your normal diet the day after surgery. 5. Most patients will experience some swelling and bruising around the umbilicus or in the groin and scrotum.  Ice packs and reclining will help.  Swelling and bruising can take several days to resolve.  6. It is common to experience some constipation if taking pain medication after surgery.  Increasing fluid intake and taking a stool softener (such as Colace) will usually help or prevent this problem from occurring.  A mild laxative (Milk of Magnesia or Miralax) should be taken according to package directions if there are no bowel movements after 48 hours. 7. Unless discharge instructions indicate otherwise, you may remove your bandages 24-48 hours after surgery, and you may shower at that time.  You may have steri-strips (small skin tapes) in place directly over the incision.  These strips should be left on the skin for 7-10 days.  If your surgeon used skin glue on the  incision, you may shower in 24 hours.  The glue will flake off over the next 2-3 weeks.  Any sutures or staples will be removed at the office during your follow-up visit. 8. ACTIVITIES:  You may resume regular (light) daily activities beginning the next day--such as daily self-care, walking, climbing stairs--gradually increasing activities as tolerated.  You may have sexual intercourse when it is comfortable.  Refrain from any heavy lifting or straining until approved by your doctor. a. You may drive when you are no longer taking prescription pain medication, you can comfortably wear a seatbelt, and you can safely maneuver your car and apply brakes. b. RETURN TO WORK:  __________________________________________________________ 9. You should see your doctor in the office for a follow-up appointment approximately 2-3 weeks after your surgery.  Make sure that you call for this appointment within a day or two after you arrive home to insure a convenient appointment time. 10. OTHER INSTRUCTIONS:  __________________________________________________________________________________________________________________________________________________________________________________________  WHEN TO CALL YOUR DOCTOR: 1. Fever over 101.0 2. Inability to urinate 3. Nausea and/or vomiting 4. Extreme swelling or bruising 5. Continued bleeding from incision. 6. Increased pain, redness, or drainage from the incision  The clinic staff is available to answer your questions during regular business hours.  Please don't hesitate to call and ask to speak to one of the nurses for clinical concerns.  If you have a medical emergency, go to the nearest emergency room or call 911.  A surgeon from [facility] is always on call at the hospital     [address], [address], [place], [place]  [address] ?  [address] [address], [place], [place]   [address] [phone] ? [phone] ? FAX [phone] Web site:  [email or web address]  

## 2013-10-01 NOTE — Op Note (Signed)
10/01/2013  9:45 AM  PATIENT:  [name]  80 y.o. male  PRE-OPERATIVE DIAGNOSIS:  LEFT INGUINAL HERNIA   POST-OPERATIVE DIAGNOSIS:  Left sliding inguinal hernia  PROCEDURE:  Procedure(s): OPEN HERNIA REPAIR INGUINAL ADULT (Left) INSERTION OF MESH (Left)  SURGEON:  Surgeon(s) and Role:    * Axel Filler, MD - Primary  PHYSICIAN ASSISTANT:   ASSISTANTS: none   ANESTHESIA:   local and general  EBL:  Total I/O In: -  Out: 200 [Urine:200]  BLOOD ADMINISTERED:none  DRAINS: none   LOCAL MEDICATIONS USED:  BUPIVICAINE   SPECIMEN:  No Specimen  DISPOSITION OF SPECIMEN:  N/A  COUNTS:  YES  TOURNIQUET:  * No tourniquets in log *  DICTATION: .Dragon Dictation Details of the procedure: The patient was taken back to the operating room. The patient was placed in supine position with bilateral SCDs in place. After appropriate anitbiotics were confirmed, a time-out was confirmed and all facts were verified.  Quarter percent Marcaine was used to infiltrate the area of the incision and an ilioinguinal nerve block was also placed. A 5 cm incision was made just 1 cm superior to the inguinal ligament. Bovie cautery was used to maintain hemostasis dissection is carried down to the external oblique.  A standard incision was made laterally, and the external oblique was bluntly dissected away from the surrounding tissue with Metzenbaum scissors. Incision was made through the external oblique and lateral stab wound through the external ring. The external oblique was elevated in the spermatic cord was bluntly dissected away from the surrounding tissue.  The ilioinguinal nerve was identified and ligated proximally. The spermatic cord and the hernia were then bluntly dissected away from the pubic tubercle and a Penrose was placed around the hernia sac in the spermatic cord. Dissection cremasterics to place a Bovie cautery. The hernia sac was encountered and  it was noticed that a segment of  colon was within the hernia sac. This was reduced into the abdomen without entering the hernia sac.Secondary to the laxity of the internal inguinal ring with, this was reapproximated with a 2-0 Vicryl in a running fashion. At this time a piece of Progrip mesh was then anchored to the pubic tubercle with a 2-0 Prolene. The superior edge was fixated to the conjoint tendon with a 2-0Prolene x1.  The tails were then cut into shape and tucked under the external oblique. The mesh surrounding the spermatic cord at the internal oblique was then tightened to allow approximately 1 fingerbreadth and sutured with a 2-0 Prolene x1. At this time the area was irrigated out with sterile saline.  The external oblique was reapproximated using a 2-0 Vicryl in a running fashion. Scarpa's fascia was then reapproximated using a 3-0 Vicryl running fashion. The skin was then reapproximated with 4 Monocryl in a subcuticular fashion. The skin was then dressed with Dermabond.  The patient was taken to the recovery room in stable condition.   PLAN OF CARE: Discharge to home after PACU  PATIENT DISPOSITION:  PACU - hemodynamically stable.   Delay start of Pharmacological VTE agent (>24hrs) due to surgical blood loss or risk of bleeding: not applicable

## 2013-10-01 NOTE — Preoperative (Signed)
Beta Blockers   Reason not to administer Beta Blockers:Took Metoprolol this am. 

## 2013-10-02 ENCOUNTER — Encounter (HOSPITAL_COMMUNITY): Payer: Self-pay | Admitting: General Surgery

## 2013-10-08 ENCOUNTER — Other Ambulatory Visit: Payer: Self-pay | Admitting: *Deleted

## 2013-10-08 MED ORDER — LORAZEPAM 1 MG PO TABS: 1.0000 mg | ORAL_TABLET | Freq: Every morning | ORAL | Status: DC

## 2013-10-08 MED ORDER — MIRTAZAPINE 15 MG PO TABS: 15.0000 mg | ORAL_TABLET | Freq: Every day | ORAL | Status: DC

## 2013-10-08 MED ORDER — PREGABALIN 100 MG PO CAPS: 100.0000 mg | ORAL_CAPSULE | Freq: Two times a day (BID) | ORAL | Status: DC

## 2013-10-08 MED ORDER — PANTOPRAZOLE SODIUM 40 MG PO TBEC: 40.0000 mg | DELAYED_RELEASE_TABLET | Freq: Every day | ORAL | Status: DC

## 2013-10-08 MED ORDER — ALLOPURINOL 300 MG PO TABS: 300.0000 mg | ORAL_TABLET | Freq: Every day | ORAL | Status: DC

## 2013-10-08 MED ORDER — ROPINIROLE HCL 4 MG PO TABS: 4.0000 mg | ORAL_TABLET | Freq: Every day | ORAL | Status: DC

## 2013-10-08 MED ORDER — TAMSULOSIN HCL 0.4 MG PO CAPS
0.4000 mg | ORAL_CAPSULE | Freq: Every day | ORAL | Status: DC
Start: 2013-10-08 — End: 2014-09-10

## 2013-10-08 MED ORDER — FUROSEMIDE 40 MG PO TABS
80.0000 mg | ORAL_TABLET | Freq: Two times a day (BID) | ORAL | Status: DC
Start: 2013-10-08 — End: 2014-06-05

## 2013-10-08 MED ORDER — METOPROLOL TARTRATE 50 MG PO TABS: 50.0000 mg | ORAL_TABLET | Freq: Two times a day (BID) | ORAL | Status: DC

## 2013-10-08 MED ORDER — BUPROPION HCL ER (XL) 150 MG PO TB24: 150.0000 mg | ORAL_TABLET | Freq: Every morning | ORAL | Status: DC

## 2013-10-08 MED ORDER — POTASSIUM CHLORIDE CRYS ER 20 MEQ PO TBCR: 20.0000 meq | EXTENDED_RELEASE_TABLET | Freq: Two times a day (BID) | ORAL | Status: DC

## 2013-10-08 MED ORDER — CLOPIDOGREL BISULFATE 75 MG PO TABS: 75.0000 mg | ORAL_TABLET | Freq: Every day | ORAL | Status: DC

## 2013-10-08 NOTE — Telephone Encounter (Signed)
Received fax from [facility] pt needing refills on lyrica & lorazepam. MD out of office. Pls advise on refill..../lm,b

## 2013-10-08 NOTE — Telephone Encounter (Signed)
Done hardcopy to robin  

## 2013-10-09 NOTE — Telephone Encounter (Signed)
Faxed hardcopy to [facility] [phone] for Lyrica #[phone] RF's and Lorazepam #[phone] RF's.

## 2013-10-14 ENCOUNTER — Telehealth: Payer: Self-pay | Admitting: *Deleted

## 2013-10-14 NOTE — Telephone Encounter (Signed)
Ok - note need for screening with [facility] prior to picking up refill (if not already done) May print for me to sign and pick up by pt tomorrow  thanks

## 2013-10-14 NOTE — Telephone Encounter (Signed)
[name] pts daughter called requesting Oxycodone for leg pain.  Please advise

## 2013-10-15 ENCOUNTER — Encounter: Payer: Self-pay | Admitting: *Deleted

## 2013-10-15 MED ORDER — OXYCODONE-ACETAMINOPHEN 5-325 MG PO TABS: 1.0000 | ORAL_TABLET | ORAL | Status: DC | PRN

## 2013-10-15 NOTE — Telephone Encounter (Signed)
Left detailed message on pts daughters VM of MDs message

## 2013-10-16 ENCOUNTER — Encounter (INDEPENDENT_AMBULATORY_CARE_PROVIDER_SITE_OTHER): Payer: Self-pay | Admitting: General Surgery

## 2013-10-16 ENCOUNTER — Ambulatory Visit (INDEPENDENT_AMBULATORY_CARE_PROVIDER_SITE_OTHER): Payer: Medicare Other | Admitting: General Surgery

## 2013-10-16 VITALS — BP 125/83 | HR 81 | Temp 98.6°F | Resp 22 | Ht 69.0 in | Wt 233.4 lb

## 2013-10-16 DIAGNOSIS — Z9889 Other specified postprocedural states: Secondary | ICD-10-CM

## 2013-10-16 NOTE — Progress Notes (Signed)
Patient ID: [name], male   DOB: 04-22-1933, 80 y.o.   MRN: [id number] Post op course The patient is a 80 year old male status post open left inguinal hernia repair with mesh. Patient has been doing well postoperatively. He states that his incisional pain is subsiding.  On Exam: Wounds clean dry and intact, there is no hernia on palpation   Assessment and Plan 80 year old male status post open left inguinal hernia repair Mesh 1. We discussed what lifting restrictions for another month 2. The patient follow up as needed   Axel Filler, MD [facility] General & Minimally Invasive Surgery Trauma & Emergency Surgery

## 2013-11-19 ENCOUNTER — Encounter: Payer: Self-pay | Admitting: Family

## 2013-11-20 ENCOUNTER — Ambulatory Visit: Payer: Medicare Other | Admitting: Family

## 2013-11-20 ENCOUNTER — Other Ambulatory Visit (HOSPITAL_COMMUNITY): Payer: Medicare Other

## 2013-11-21 HISTORY — PX: FINGER AMPUTATION: SHX636

## 2013-12-25 ENCOUNTER — Emergency Department (HOSPITAL_BASED_OUTPATIENT_CLINIC_OR_DEPARTMENT_OTHER): Payer: Medicare Other

## 2013-12-25 ENCOUNTER — Emergency Department (HOSPITAL_BASED_OUTPATIENT_CLINIC_OR_DEPARTMENT_OTHER)
Admission: EM | Admit: 2013-12-25 | Discharge: 2013-12-25 | Disposition: A | Discharge status: Home / Self Care | Payer: Medicare Other | Attending: Emergency Medicine | Admitting: Emergency Medicine

## 2013-12-25 ENCOUNTER — Encounter (HOSPITAL_BASED_OUTPATIENT_CLINIC_OR_DEPARTMENT_OTHER): Payer: Self-pay | Admitting: Emergency Medicine

## 2013-12-25 DIAGNOSIS — I2510 Atherosclerotic heart disease of native coronary artery without angina pectoris: Secondary | ICD-10-CM | POA: Insufficient documentation

## 2013-12-25 DIAGNOSIS — I872 Venous insufficiency (chronic) (peripheral): Secondary | ICD-10-CM | POA: Insufficient documentation

## 2013-12-25 DIAGNOSIS — Y929 Unspecified place or not applicable: Secondary | ICD-10-CM | POA: Insufficient documentation

## 2013-12-25 DIAGNOSIS — K219 Gastro-esophageal reflux disease without esophagitis: Secondary | ICD-10-CM | POA: Insufficient documentation

## 2013-12-25 DIAGNOSIS — S61209A Unspecified open wound of unspecified finger without damage to nail, initial encounter: Secondary | ICD-10-CM | POA: Insufficient documentation

## 2013-12-25 DIAGNOSIS — M109 Gout, unspecified: Secondary | ICD-10-CM | POA: Insufficient documentation

## 2013-12-25 DIAGNOSIS — F3289 Other specified depressive episodes: Secondary | ICD-10-CM | POA: Insufficient documentation

## 2013-12-25 DIAGNOSIS — I6529 Occlusion and stenosis of unspecified carotid artery: Secondary | ICD-10-CM | POA: Insufficient documentation

## 2013-12-25 DIAGNOSIS — F329 Major depressive disorder, single episode, unspecified: Secondary | ICD-10-CM | POA: Insufficient documentation

## 2013-12-25 DIAGNOSIS — S68119A Complete traumatic metacarpophalangeal amputation of unspecified finger, initial encounter: Secondary | ICD-10-CM

## 2013-12-25 DIAGNOSIS — W268XXA Contact with other sharp object(s), not elsewhere classified, initial encounter: Secondary | ICD-10-CM | POA: Insufficient documentation

## 2013-12-25 DIAGNOSIS — S68129A Partial traumatic metacarpophalangeal amputation of unspecified finger, initial encounter: Secondary | ICD-10-CM

## 2013-12-25 DIAGNOSIS — I252 Old myocardial infarction: Secondary | ICD-10-CM | POA: Insufficient documentation

## 2013-12-25 DIAGNOSIS — I10 Essential (primary) hypertension: Secondary | ICD-10-CM | POA: Insufficient documentation

## 2013-12-25 DIAGNOSIS — I308 Other forms of acute pericarditis: Secondary | ICD-10-CM | POA: Insufficient documentation

## 2013-12-25 DIAGNOSIS — M48061 Spinal stenosis, lumbar region without neurogenic claudication: Secondary | ICD-10-CM | POA: Insufficient documentation

## 2013-12-25 DIAGNOSIS — Y939 Activity, unspecified: Secondary | ICD-10-CM | POA: Insufficient documentation

## 2013-12-25 DIAGNOSIS — G4733 Obstructive sleep apnea (adult) (pediatric): Secondary | ICD-10-CM | POA: Insufficient documentation

## 2013-12-25 DIAGNOSIS — Z23 Encounter for immunization: Secondary | ICD-10-CM | POA: Insufficient documentation

## 2013-12-25 MED ORDER — TETANUS-DIPHTH-ACELL PERTUSSIS 5-2.5-18.5 LF-MCG/0.5 IM SUSP
0.5000 mL | Freq: Once | INTRAMUSCULAR | Status: AC
  Administered 2013-12-25: 0.5 mL via INTRAMUSCULAR
  Filled 2013-12-25: qty 0.5

## 2013-12-25 MED ORDER — CEFAZOLIN SODIUM 1-5 GM-% IV SOLN
1.0000 g | Freq: Once | INTRAVENOUS | Status: AC
  Administered 2013-12-25: 1 g via INTRAVENOUS
  Filled 2013-12-25: qty 50

## 2013-12-25 MED ORDER — MORPHINE SULFATE 4 MG/ML IJ SOLN
INTRAMUSCULAR | Status: AC
  Administered 2013-12-25: 4 mg via INTRAVENOUS
  Filled 2013-12-25: qty 1

## 2013-12-25 MED ORDER — MORPHINE SULFATE 4 MG/ML IJ SOLN
4.0000 mg | Freq: Once | INTRAMUSCULAR | Status: AC
  Administered 2013-12-25: 4 mg via INTRAVENOUS

## 2013-12-25 MED ORDER — SODIUM CHLORIDE 0.9 % IV SOLN
Freq: Once | INTRAVENOUS | Status: AC
  Administered 2013-12-25: 15:00:00 via INTRAVENOUS

## 2013-12-25 MED ORDER — CEPHALEXIN 500 MG PO CAPS: 500.0000 mg | ORAL_CAPSULE | Freq: Four times a day (QID) | ORAL | Status: DC

## 2013-12-25 MED ORDER — OXYCODONE HCL 5 MG PO TABS: 10.0000 mg | ORAL_TABLET | ORAL | Status: DC | PRN

## 2013-12-25 NOTE — ED Provider Notes (Signed)
Medical screening examination/treatment/procedure(s) were performed by non-physician practitioner and as supervising physician I was immediately available for consultation/collaboration.   EKG Interpretation None        Candyce Churn III, MD 12/25/13 2036

## 2013-12-25 NOTE — ED Notes (Signed)
Dr. Junie Bame at bedside

## 2013-12-25 NOTE — ED Provider Notes (Signed)
4:16 PM Patient transferred from [facility].  To be admitted by Dr. Amanda Pea for evaluation and care of right small finger.  Injured while working with a table saw today.   Pain is mild.  Bleeding controlled with gauze and bandaging.    Patient is not in any apparent distress.  Will inform Dr. Amanda Pea of the patient's arrival.  Roxy Horseman, PA-C 12/25/13 1847

## 2013-12-25 NOTE — Discharge Instructions (Signed)
Please do not remove your bandage in please keep it clean and dry.  Office will call for your followup.  Keep bandage clean and dry.  Call for any problems.  No smoking.  Criteria for driving a car: you should be off your pain medicine for 7-8 hours, able to drive one handed(confident), thinking clearly and feeling able in your judgement to drive. Continue elevation as it will decrease swelling.  If instructed by MD move your fingers within the confines of the bandage/splint.  Use ice if instructed by your MD. Call immediately for any sudden loss of feeling in your hand/arm or change in functional abilities of the extremity.  We recommend that you to take vitamin C 1000 mg a day to promote healing we also recommend that if you require her pain medicine that he take a stool softener to prevent constipation as most pain medicines will have constipation side effects. We recommend either Peri-Colace or Senokot and recommend that you also consider adding MiraLAX to prevent the constipation affects from pain medicine if you are required to use them. These medicines are over the counter and maybe purchased at a local pharmacy.

## 2013-12-25 NOTE — Consult Note (Signed)
Reason for Consult: Right small finger table saw injury Referring Physician: ER physician  [name] is an 80 y.o. male.  HPI: Patient is a 80 year old male with multiple medical problems with table saw injury today. He was given Ancef and tetanus in [facility] and referred.  He complains pain small fingers was bleeding. He is here today with his son-in-law.  He denies hip back chest or abdominal pain. He has a history of chronic lower extremity venous stasis. He also has a history of cardiac maladies.  He is on blood thinners.  He notes no chest pain or other significant issues besides the small finger. 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 The patient denies neck back chest or of abdominal pain. The patient notes that they have no lower extremity problems. The patient from primarily complains of the upper extremity pain noted. Past Medical History  Diagnosis Date  . Gout, unspecified     severe dz, "treatment done that last 15 years"  . OBESITY   . RESTLESS LEG SYNDROME   . DETACHMENT, RETINAL NOS 1994  . CATARACT NOS 2008  . HYPERTENSION   . CORONARY ARTERY DISEASE   . PERICARDITIS, ACUTE NEC 2007    MSSA, s/p pericardial window  . GERD   . DEGENERATIVE JOINT DISEASE   . CAROTID ENDARTERECTOMY, LEFT, HX OF   . OBSTRUCTIVE SLEEP APNEA     CPAP qhs  . TIA (transient ischemic attack) 2005  . BPH (benign prostatic hypertrophy)     "minor"  . Lumbar spinal stenosis   . Carotid artery occlusion   . Unspecified venous (peripheral) insufficiency   . Stroke May 2003  . Heart murmur   . Thin skin   . Shortness of breath     with exertion  . MYOCARDIAL INFARCTION 2004, 03/2010    "minor"  . Anxiety   . Depression     Past Surgical History  Procedure Laterality Date  . Pericardial window  2007  . Left arm   2008    shoulder  . Right arm  1970's    shoulder  . Cataract extraction      Left side x's 2 and right  . Retinal detachment surgery      left side  . Carotid endarterectomy Left 12-06-05    cea  . Coronary angioplasty  01/2009    x 1 stent  . Colonoscopy    . Carpal tunnel release Bilateral   . Back surgery  2013    removed bone spurs  . Inguinal hernia repair Left 10/01/2013    Procedure: HERNIA REPAIR INGUINAL ADULT;  Surgeon: Axel Filler, MD;  Location: [facility];  Service: General;  Laterality: Left;  . Insertion of mesh Left 10/01/2013    Procedure: INSERTION OF MESH;  Surgeon: Axel Filler, MD;  Location: [facility];  Service: General;  Laterality: Left;    Family History  Problem Relation Age of Onset  . Heart disease Mother   . Diabetes Father   . Heart disease Father   . Breast cancer Sister   . Cancer Sister   . Prostate cancer Brother   . Lung cancer Brother   . Cancer Brother   . Diabetes Daughter   . Diabetes Son     Social History:  reports that he has never smoked. He has never  used smokeless tobacco. He reports that he does not drink alcohol or use illicit drugs.  Allergies:  Allergies  Allergen Reactions  . Atorvastatin     REACTION: tol simvastatin ok per pt ( Pt. Says ALL the Statins ) Severe muscle weakness    Medications: I have reviewed the patient's current medications.  No results found for this or any previous visit (from the past 48 hour(s)).  Dg Finger Little Right  12/25/2013   CLINICAL DATA:  Status post table saw injury involving the tip of the fifth digit  EXAM: RIGHT LITTLE FINGER 2+V  COMPARISON:  None.  FINDINGS: The patient has sustained partial amputation of the shaft of the distal phalanx. The ventral aspect of the phalanx is preserved but the dorsal 1/2 of the shaft is absent. The fracture extends to the DIP joint. The overlying soft tissues are avulsed. The proximal and middle phalanges are intact.  IMPRESSION: The patient has  sustained partial amputation of the distal phalanx of the right fifth finger. A fracture line extends into the DIP joint space.   Electronically Signed   By: David  [place]azil[place]   On: 12/25/2013 13:26    Review of Systems  Eyes: Negative.   Respiratory: Negative.   Cardiovascular: Negative.   Gastrointestinal: Negative.   Musculoskeletal:       See note  Skin:       Chronic lower extremity venous stasis  Neurological: Negative.   Endo/Heme/Allergies: Bruises/bleeds easily.       Patient takes blood thinners  Psychiatric/Behavioral: Negative.    Blood pressure 125/56, pulse 54, temperature 97.5 F (36.4 C), temperature source Oral, resp. rate 20, height 5\' 9"  (1.753 m), weight 108.863 kg (240 lb), SpO2 93.00%. Physical Exam open fracture left small finger with exposed bone and a significant skin and soft tissue void dorsally. He does not have any signs of compartment syndrome or infection. He does not have any instability. He has only a small bit of the distal interphalangeal joint remaining.  He has chronic venous stasis ulcers in his lower extremities and poor foot hygiene.  Abdomen is obese nontender nondistended. Neck and back are nontender.  Chest clear.  He denies chest pain his heart is regular rate. Left upper extremity is neurovascular intact.    Assessment/Plan: Open small finger fractures secondary to table saw injury will plan for I&D repair is necessary and likely volar  flap revision  We are planning surgery for your upper extremity. The risk and benefits of surgery include risk of bleeding infection anesthesia damage to normal structures and failure of the surgery to accomplish its intended goals of relieving symptoms and restoring function with this in mind we'll going to proceed. I have specifically discussed with the patient the pre-and postoperative regime and the does and don'ts and risk and benefits in great detail. Risk and benefits of surgery also include risk of  dystrophy chronic nerve pain failure of the healing process to go onto completion and other inherent risks of surgery The relavent the pathophysiology of the disease/injury process, as well as the alternatives for treatment and postoperative course of action has been discussed in great detail with the patient who desires to proceed.  We will do everything in our power to help you (the patient) restore function to the upper extremity. Is a pleasure to see this patient today.   See op note [phone]#[id number] [name] 12/25/2013, 6:21 PM

## 2013-12-25 NOTE — ED Notes (Signed)
Hand surgery at bedside.

## 2013-12-25 NOTE — ED Provider Notes (Signed)
CSN: [id number]     Arrival date & time 12/25/13  1236 History   First MD Initiated Contact with Patient 12/25/13 1252     Chief Complaint  Patient presents with  . Extremity Laceration     (Consider location/radiation/quality/duration/timing/severity/associated sxs/prior Treatment) HPI Comments: Patient is a 80 year old male with history of diabetes, coronary artery disease. He presents today with complaints of injury to the right fifth finger that occurred while operating a table saw. His last tetanus shot is unknown. Pain is worse with movement and palpation and relieved with rest.  The history is provided by the patient.    Past Medical History  Diagnosis Date  . Gout, unspecified     severe dz, "treatment done that last 15 years"  . OBESITY   . RESTLESS LEG SYNDROME   . DETACHMENT, RETINAL NOS 1994  . CATARACT NOS 2008  . HYPERTENSION   . CORONARY ARTERY DISEASE   . PERICARDITIS, ACUTE NEC 2007    MSSA, s/p pericardial window  . GERD   . DEGENERATIVE JOINT DISEASE   . CAROTID ENDARTERECTOMY, LEFT, HX OF   . OBSTRUCTIVE SLEEP APNEA     CPAP qhs  . TIA (transient ischemic attack) 2005  . BPH (benign prostatic hypertrophy)     "minor"  . Lumbar spinal stenosis   . Carotid artery occlusion   . Unspecified venous (peripheral) insufficiency   . Stroke May 2003  . Heart murmur   . Thin skin   . Shortness of breath     with exertion  . MYOCARDIAL INFARCTION 2004, 03/2010    "minor"  . Anxiety   . Depression    Past Surgical History  Procedure Laterality Date  . Pericardial window  2007  . Left arm  2008    shoulder  . Right arm  1970's    shoulder  . Cataract extraction      Left side x's 2 and right  . Retinal detachment surgery      left side  . Carotid endarterectomy Left 12-06-05    cea  . Coronary angioplasty  01/2009    x 1 stent  . Colonoscopy    . Carpal tunnel release Bilateral   . Back surgery  2013    removed bone spurs  . Inguinal hernia repair  Left 10/01/2013    Procedure: HERNIA REPAIR INGUINAL ADULT;  Surgeon: Axel FillerArmando Ramirez, MD;  Location: [facility];  Service: General;  Laterality: Left;  . Insertion of mesh Left 10/01/2013    Procedure: INSERTION OF MESH;  Surgeon: Axel FillerArmando Ramirez, MD;  Location: [facility];  Service: General;  Laterality: Left;   Family History  Problem Relation Age of Onset  . Heart disease Mother   . Diabetes Father   . Heart disease Father   . Breast cancer Sister   . Cancer Sister   . Prostate cancer Brother   . Lung cancer Brother   . Cancer Brother   . Diabetes Daughter   . Diabetes Son    History  Substance Use Topics  . Smoking status: Never Smoker   . Smokeless tobacco: Never Used  . Alcohol Use: No    Review of Systems  All other systems reviewed and are negative.     Allergies  Atorvastatin  Home Medications   Prior to Admission medications   Medication Sig Start Date End Date Taking? Authorizing Provider  allopurinol (ZYLOPRIM) 300 MG tablet Take 1 tablet (300 mg total) by mouth daily. 10/08/13  Newt Lukes, MD  aspirin EC 81 MG tablet Take 81 mg by mouth daily.    Historical Provider, MD  buPROPion (WELLBUTRIN XL) 150 MG 24 hr tablet Take 1 tablet (150 mg total) by mouth every morning. 10/08/13   Newt Lukes, MD  clopidogrel (PLAVIX) 75 MG tablet Take 1 tablet (75 mg total) by mouth daily. 10/08/13   Newt Lukes, MD  Ferrous Sulfate (IRON) 28 MG TABS Take 1 tablet by mouth daily.     Historical Provider, MD  furosemide (LASIX) 40 MG tablet Take 2 tablets (80 mg total) by mouth 2 (two) times daily. 10/08/13   Newt Lukes, MD  LORazepam (ATIVAN) 1 MG tablet Take 1 tablet (1 mg total) by mouth every morning. 10/08/13   Corwin Levins, MD  Magnesium 250 MG TABS Take 1 tablet by mouth daily.     Historical Provider, MD  metoprolol (LOPRESSOR) 50 MG tablet Take 1 tablet (50 mg total) by mouth 2 (two) times daily. 10/08/13   Newt Lukes, MD  mirabegron ER  (MYRBETRIQ) 50 MG TB24 tablet Take 50 mg by mouth daily.    Historical Provider, MD  mirtazapine (REMERON) 15 MG tablet Take 1 tablet (15 mg total) by mouth at bedtime. 10/08/13   Newt Lukes, MD  nitroGLYCERIN (NITROSTAT) 0.4 MG SL tablet Place 0.4 mg under the tongue every 5 (five) minutes as needed. For chest pain    Historical Provider, MD  Omega-3 Fatty Acids (FISH OIL) 1200 MG CAPS Take 1,200 mg by mouth daily.     Historical Provider, MD  oxycodone (OXY-IR) 5 MG capsule Take 1 capsule (5 mg total) by mouth every 12 (twelve) hours as needed for pain. 05/27/13   Newt Lukes, MD  oxyCODONE-acetaminophen (ROXICET) 5-325 MG per tablet Take 1-2 tablets by mouth every 4 (four) hours as needed for severe pain. 10/15/13   Newt Lukes, MD  pantoprazole (PROTONIX) 40 MG tablet Take 1 tablet (40 mg total) by mouth daily. 10/08/13   Newt Lukes, MD  potassium chloride SA (K-DUR,KLOR-CON) 20 MEQ tablet Take 1 tablet (20 mEq total) by mouth 2 (two) times daily. 10/08/13   Newt Lukes, MD  pregabalin (LYRICA) 100 MG capsule Take 1 capsule (100 mg total) by mouth 2 (two) times daily. 10/08/13   Corwin Levins, MD  rOPINIRole (REQUIP) 4 MG tablet Take 1 tablet (4 mg total) by mouth at bedtime. 10/08/13   Newt Lukes, MD  tamsulosin (FLOMAX) 0.4 MG CAPS capsule Take 1 capsule (0.4 mg total) by mouth daily. 10/08/13   Newt Lukes, MD   BP 148/58  Pulse 56  Temp(Src) 97.5 F (36.4 C) (Oral)  Resp 16  Ht 5\' 9"  (1.753 m)  Wt 240 lb (108.863 kg)  BMI 35.43 kg/m2  SpO2 100% Physical Exam  Nursing note and vitals reviewed. Constitutional: He is oriented to person, place, and time. He appears well-developed and well-nourished. No distress.  HENT:  Head: Normocephalic and atraumatic.  Neck: Normal range of motion. Neck supple.  Musculoskeletal:  The right fifth finger is noted to have an abortion of the nail and the remainder of the soft tissues overlying the lateral  aspect of the distal phalanx. There is bone exposed and missing tissue.  Neurological: He is alert and oriented to person, place, and time.  Skin: Skin is warm and dry. He is not diaphoretic.    ED Course  Procedures (including critical care time)  Labs Review Labs Reviewed - No data to display  Imaging Review No results found.   EKG Interpretation None      MDM   Final diagnoses:  None    X-rays reveal a partial amputation of the distal phalanx with fracture line extending into the joint space. There is exposed bone and missing tissue that will require more care than what I can performed. I've spoken with Dr. Amanda Pea at [facility] who agrees to accept in transfer. The patient will go to the [facility] for repair. He has been given Ancef and his tetanus has been updated.   Geoffery Lyons, MD 12/25/13 [phone]

## 2013-12-25 NOTE — ED Notes (Signed)
Laceration to right 5th finger on table saw.  No active bleeding noted.

## 2013-12-26 NOTE — Op Note (Signed)
NAMESAAHIR, GIDCUMB NO.:  [id number]  MEDICAL RECORD NO.:  [id number]  LOCATION:  E40C                         FACILITY:  [facility]  PHYSICIAN:  Dionne Ano. Malacai Grantz, M.D.DATE OF BIRTH:  07-Dec-1932  DATE OF PROCEDURE:  12/25/2013 DATE OF DISCHARGE:  12/25/2013                              OPERATIVE REPORT   PREOPERATIVE DIAGNOSIS:  Table saw injury right small finger.  POSTOPERATIVE DIAGNOSIS:  Table saw injury right small finger.  PROCEDURE: 1. Irrigation and debridement of open fracture, skin and subcutaneous     tissue, and bone.  This was an excisional debridement on the bone     skin, subcu associated soft tissues with curette knife blade and     scissor tip. 2. Open treatment distal phalanx fracture. 3. Volar advancement flap closure/revision amputation, right small     finger.  SURGEON:  Dionne Ano. Amanda Pea, M.D.  ASSISTANT:  Karie Chimera, P.A.-C.  COMPLICATION:  None.  ANESTHESIA:  Intermetacarpal block.  INDICATIONS FOR PROCEDURE:  The patient was seen by myself and Mr. Wynona Neat who performed the intermetacarpal block with lidocaine with epinephrine.  He was prepped and draped in usual sterile fashion with 2 separate Betadine scrubs followed by copious irrigation.  Once sterile field was secured.  The patient underwent I and D of skin, subcutaneous tissue, and bone.  Excisional debridement with curette, knife, blade, and scissor tip of an open fracture.  Copious amounts of saline were placed through and through the wound.  Following this, the patient then underwent open treatment of the distal phalanx fracture.  This was done with rongeur and sculpting technique. This was very carefully done to preserve pulp tissue.  Following this, we then turned attention towards coverage.  The patient underwent a volar advancement flap by mobilizing the volar flap without difficulty, taking care to preserve the vascular continuity and integrity.  Following  this, he was swung up and over and trimmed, so there would be no redundant tissue on the lateral folds.  We were able to keep as much pulp was possible.  Following volar advancement flap placement, the patient then underwent sterile dressing application of Adaptic Xeroform gauze and did quite well.  We are going to place him in a regime of immobilization. We will have therapy see him back in 7-10 days for dressing change.  We will see me in 2 weeks.  Discharge medicines Keflex 500 q.i.d. x10 days, as well as OxyIR p.r.n. pain with precautions.  I discussed with him do's and don'ts, keeping the area clean, dry, elevated all times and notify should any problems occur.  It has been an absolute pleasure to see him today tolerated the procedure well.     Dionne Ano. Amanda Pea, M.D.     [facility]  D:  12/25/2013  T:  12/26/2013  Job:  [id number]

## 2014-01-06 ENCOUNTER — Encounter: Payer: Self-pay | Admitting: Cardiology

## 2014-01-06 NOTE — Progress Notes (Unsigned)
Patient ID: Mason SpragueBilly [name], male   DOB: 10/05/1932, 80 y.o.   MRN: [id number]   Mason SpragueZimmerman, [name] T  Date of visit:  01/06/2014 DOB:  003/14/1934    Age:  [age] yrs. Medical record number:  [id number]     Account number:  [id number] Primary Care Provider: Rene PaciLESCHBER,VALERIE ANN ____________________________ CURRENT DIAGNOSES  1. CAD,Native  2. Stent Placement (drug eluting)  3. Aortic Valve Disorder  4. Hyperlipidemia  5. Obesity(BMI30-40)  6. Hypertensive Heart Disease-Benign without CHF  7. Diabetes Mellitus-NIDD  8. Sleep Apnea  9. Restless Legs Syndrome (rls)  10. Gouty Arthropathy Unspecified  11. Atherosclerosis-Carotid Artery  12. Dyspnea  13. Personal history of TIA or stroke without residua ____________________________ ALLERGIES  Atorvastatin, Muscle aches  Febuxostat, Rash  Simvastatin, Muscle aches ____________________________ MEDICATIONS  1. Requip 4 mg Tablet, 1 p.o. daily  2. Vitamin D 1,000 unit Capsule, 1 p.o. daily  3. oxycodone 5 mg Capsule, PRN  4. mirtazapine 15 mg Tablet, Rapid Dissolve, 1 p.o. daily  5. metoprolol tartrate 50 mg Tablet, BID  6. Klor-Con M20 20 mEq Tablet, ER Particles/Crystals, BID  7. lorazepam 1 mg Tablet, 1 p.o. daily  8. pantoprazole 40 mg Recon Soln, 1 p.o. daily  9. Vitamin B-12 1,000 mcg Tablet, 1 p.o. daily  10. Wellbutrin SR 150 mg tablet extended release, 1 p.o. daily  11. allopurinol 300 mg tablet, 1 p.o. daily  12. ferrous gluconate 256 mg (28 mg iron) tablet, 1 p.o. daily  13. magnesium 250 mg tablet, 1 p.o. daily  14. Fish Oil 1,000 mg capsule, 1 p.o. daily  15. Lyrica 100 mg capsule, BID  16. clopidogrel 75 mg tablet, 1 p.o. daily  17. aspirin 81 mg chewable tablet, 1 p.o. daily  18. tamsulosin ER 0.4 mg capsule,extended release 24 hr, 1 p.o. daily  19. furosemide 40 mg tablet, 2 p.o. b.i.d. ____________________________ CHIEF COMPLAINTS  Followup of Aortic Valve Disorder  Followup of  CAD,Native ____________________________ HISTORY OF PRESENT ILLNESS  Patient seen for cardiac followup. He had an accident with his table saw and lost the tip of his left fifth finger. He is doing fairly well with that. He denies angina and has no PND orthopnea, syncope, or claudication. He tolerated his hernia surgery fairly well. He does have mild to moderate aortic stenosis and his blood pressure is been under reasonable control. He has some mild edema and also has been having some mild ulcers on his lower legs. ____________________________ PAST HISTORY  Past Medical Illnesses:  hypertension, hyperlipidemia, obesity, gout, TIA, restless legs, BPH, right brain CVA, osteoarthritis, sleep apnea;  Cardiovascular Illnesses:  CAD, staph pericarditis 11/07 with window, history of  carotid atery disease;  Surgical Procedures:  cataract extraction OU, detached retina, carpal tunnel release, shoulder repair-rt, nasal surg, carotid endarterectomy-left;  NYHA Classification:  II;  Cardiology Procedures-Invasive:  pericardial window 06/2006, cardiac cath (left) July 2010, Xience DES stent  circumflex  July 2010  Dr. Excell Seltzerooper, cardiac cath (left) September 2011;  Cardiology Procedures-Noninvasive:  regadenoson thallium July 2010, echocardiogram May 2013, echocardiogram January 2014, echocardiogram December 2014;  Cardiac Cath Results:  normal Left main, 20 % stenosis proximal LAD, 40% stenosis mid LAD, 70% stenosis proximal Diag 1, 90% stenosis prox CFX, 90% first OM, small and nondominant RCA;  LVEF of 50% documented via echocardiogram on 07/09/2013,   ____________________________ CARDIO-PULMONARY TEST DATES EKG Date:  08/26/2013;   Cardiac Cath Date:  04/15/2010;  Stent Placement Date: 02/11/2009;  Nuclear Study Date:  02/04/2009;  Echocardiography Date:  07/09/2013;  Chest Xray Date: 04/11/2010;   ____________________________ FAMILY HISTORY Brother -- Brother dead, Carcinoma of the pancreas Father -- Father dead,  Coronary Artery Disease, Deceased Mother -- Mother dead, Coronary Artery Disease Sister -- Sister alive and well Sister -- Sister dead, Chronic obstructive lung disease Sister -- Sister dead, Cancer Sister -- Sister alive with problem, Dementia/Alzheimer's ____________________________ SOCIAL HISTORY Alcohol Use:  no alcohol use;  Smoking:  never smoked;  Diet:  regular diet;  Lifestyle:  widower;  Exercise:  no regular exercise;  Occupation:  retired and [employer];  Residence:  lives with daughter;   ____________________________ REVIEW OF SYSTEMS General:  obesity  Integumentary:easy bruisability Eyes: history of retinal detachment, cataract extraction O.U. Respiratory: mild dyspnea with exertion Cardiovascular:  please review HPI Abdominal: see HPI Genitourinary-Male: nocturia, erectile dysfunction  Musculoskeletal:  arthritis of the shoulder, arthritis of the neck, restless legs, chronic low back pain Neurological:  left arm weakness  ____________________________ PHYSICAL EXAMINATION VITAL SIGNS  Blood Pressure:  114/62 Sitting, Left arm, regular cuff  , 110/60 Standing, Left arm and regular cuff   Pulse:  74/min. Weight:  235.00 lbs. Height:  70"BMI: 33  Constitutional:  pleasant white male in no acute distress, moderately obese Skin:  scattered hemangiomas, multiple seborrhic keratosis Head:  normocephalic, normal hair pattern, no masses or tenderness ENT:  rosacea, several missing teeth Neck:  supple, no masses, thyromegaly, JVD. Carotid pulses are full and equal bilaterally without bruits., healed left carotid endarterectomy scar Chest:  normal symmetry, clear to auscultation. Cardiac:  regular rhythm, normal S1 and S2, no S3 or S4, grade 2/6 systolic murmur left sternal border Peripheral Pulses:  pulses full and equal in all extremities Extremities & Back:  large tophaceous knot on right knee and elbows, 1+ edema, bilateral venous insufficiency changes present,  amputation of part of fifth finger Neurological:  weakness left hand ____________________________ MOST RECENT LIPID PANEL 07/09/13  CHOL TOTL 192 mg/dl, LDL 165 NM, HDL 25 mg/dl, TRIGLYCER 537 mg/dl and CHOL/HDL 7.8 (Calc) ____________________________ IMPRESSIONS/PLAN  1. Coronary artery disease with previous stent the circumflex and moderately severe LAD disease the right coronary artery disease 2. Moderate aortic stenosis 3. Prior right brain CVA 4. Hypertensive heart disease  Recommendations:  Tolerated his previous surgery reasonably well. He does have some chronic venous insufficiency noted. No recent gout. Followup in 6 months. Call if recurrent problems. ____________________________ TODAYS ORDERS  1. Return Visit: 6 months                       ____________________________ Cardiology Physician:  Darden Palmer MD [facility]

## 2014-01-16 ENCOUNTER — Other Ambulatory Visit: Payer: Self-pay

## 2014-01-16 MED ORDER — PREGABALIN 100 MG PO CAPS: ORAL_CAPSULE | ORAL | Status: DC

## 2014-01-16 NOTE — Telephone Encounter (Signed)
Done hardcopy to robin  

## 2014-01-16 NOTE — Telephone Encounter (Signed)
Faxed hardcopy to [facility]. 

## 2014-01-19 ENCOUNTER — Ambulatory Visit (INDEPENDENT_AMBULATORY_CARE_PROVIDER_SITE_OTHER): Payer: Medicare Other | Admitting: Internal Medicine

## 2014-01-19 ENCOUNTER — Other Ambulatory Visit (INDEPENDENT_AMBULATORY_CARE_PROVIDER_SITE_OTHER): Payer: Medicare Other

## 2014-01-19 ENCOUNTER — Encounter: Payer: Self-pay | Admitting: Internal Medicine

## 2014-01-19 VITALS — BP 148/60 | HR 63 | Temp 97.6°F | Ht 69.0 in | Wt 239.8 lb

## 2014-01-19 DIAGNOSIS — E1159 Type 2 diabetes mellitus with other circulatory complications: Secondary | ICD-10-CM

## 2014-01-19 DIAGNOSIS — G2581 Restless legs syndrome: Secondary | ICD-10-CM

## 2014-01-19 DIAGNOSIS — I798 Other disorders of arteries, arterioles and capillaries in diseases classified elsewhere: Secondary | ICD-10-CM

## 2014-01-19 DIAGNOSIS — F411 Generalized anxiety disorder: Secondary | ICD-10-CM

## 2014-01-19 DIAGNOSIS — E785 Hyperlipidemia, unspecified: Secondary | ICD-10-CM

## 2014-01-19 DIAGNOSIS — M519 Unspecified thoracic, thoracolumbar and lumbosacral intervertebral disc disorder: Secondary | ICD-10-CM

## 2014-01-19 LAB — MICROALBUMIN / CREATININE URINE RATIO
Creatinine,U: 122 mg/dL
MICROALB UR: 0.9 mg/dL (ref 0.0–1.9)
Microalb Creat Ratio: 0.7 mg/g (ref 0.0–30.0)

## 2014-01-19 LAB — LIPID PANEL
CHOLESTEROL: 215 mg/dL — AB (ref 0–200)
HDL: 27.9 mg/dL — AB (ref >39.00)
NonHDL: 187.1
Total CHOL/HDL Ratio: 8
Triglycerides: 517 mg/dL — ABNORMAL HIGH (ref 0.0–149.0)
VLDL: 103.4 mg/dL — ABNORMAL HIGH (ref 0.0–40.0)

## 2014-01-19 LAB — HEMOGLOBIN A1C: HEMOGLOBIN A1C: 6.2 % (ref 4.6–6.5)

## 2014-01-19 MED ORDER — PREGABALIN 200 MG PO CAPS: 400.0000 mg | ORAL_CAPSULE | Freq: Two times a day (BID) | ORAL | Status: DC

## 2014-01-19 MED ORDER — OXYCODONE-ACETAMINOPHEN 10-325 MG PO TABS: 1.0000 | ORAL_TABLET | Freq: Three times a day (TID) | ORAL | Status: DC | PRN

## 2014-01-19 NOTE — Assessment & Plan Note (Signed)
Chronic symptoms - improved with Lyrica, will titrate up now as also used for gout and other neuropathic pain Continue Requip and iron  Refill on oxycodone (percocet) as above - uses same when severe symptoms

## 2014-01-19 NOTE — Assessment & Plan Note (Signed)
S/p spinal stenosis surgery decompression at [facility] 05/2012 - [facility] Same reviewed - continue PT and pain control as rx'd -  refill on percocet -requests "10"s as prev rx'd, uses <#60/49mo

## 2014-01-19 NOTE — Assessment & Plan Note (Signed)
Steroid induced in past - no steroids since 2011 Remains diet controlled t present Recheck a1c q6-12 mo and consider metformin or other med if elevated The patient is asked to make an attempt to improve diet and exercise patterns to aid in medical management of this problem.  Lab Results  Component Value Date   HGBA1C 6.4 08/20/2013

## 2014-01-19 NOTE — Assessment & Plan Note (Signed)
Overlap anxiety and depression Uses chronic BID lorazepam and wellbutrin + remeron qhs The current medical regimen is effective;  continue present plan and medications. Refills provided  

## 2014-01-19 NOTE — Patient Instructions (Signed)
It was good to see you today.  We have reviewed your prior records including labs and tests today  Test(s) ordered today. Your results will be released to MyChart (or called to you) after review, usually within 72hours after test completion. If any changes need to be made, you will be notified at that same time.  Medications reviewed and updated Increase dose of oxycodone to Percocet 10 as requested, we do not expect any requested refill in the next 6 months Increase Lyrica to 400 mg twice daily No other changes recommended at this time.  Please schedule followup in 6 months, call sooner if problems.

## 2014-01-19 NOTE — Progress Notes (Signed)
Subjective:    Patient ID: [name], male    DOB: May 03, 1933, 80 y.o.   MRN: [id number]  HPI  Patient here for follow up Reviewed chronic medical issues and interval medical events  Past Medical History  Diagnosis Date  . Gout, unspecified     severe dz, "treatment done that last 15 years"  . OBESITY   . RESTLESS LEG SYNDROME   . DETACHMENT, RETINAL NOS 1994  . CATARACT NOS 2008  . HYPERTENSION   . CORONARY ARTERY DISEASE   . PERICARDITIS, ACUTE NEC 2007    MSSA, s/p pericardial window  . GERD   . DEGENERATIVE JOINT DISEASE   . CAROTID ENDARTERECTOMY, LEFT, HX OF   . OBSTRUCTIVE SLEEP APNEA     CPAP qhs  . TIA (transient ischemic attack) 2005  . BPH (benign prostatic hypertrophy)     "minor"  . Lumbar spinal stenosis   . Carotid artery occlusion   . Unspecified venous (peripheral) insufficiency   . Stroke May 2003  . MYOCARDIAL INFARCTION 2004, 03/2010    "minor"  . Anxiety   . Depression     Review of Systems  Respiratory: Positive for shortness of breath (chronic, exertional and rest). Negative for cough and wheezing.   Cardiovascular: Negative for chest pain and leg swelling.  Musculoskeletal: Positive for back pain.  Psychiatric/Behavioral: Positive for sleep disturbance (RLS). The patient is nervous/anxious (chronic).        Objective:   Physical Exam  BP 148/60  Pulse 63  Temp(Src) 97.6 F (36.4 C) (Oral)  Ht 5\' 9"  (1.753 m)  Wt 239 lb 12.8 oz (108.773 kg)  BMI 35.40 kg/m2  SpO2 93% Wt Readings from Last 3 Encounters:  01/19/14 239 lb 12.8 oz (108.773 kg)  12/25/13 240 lb (108.863 kg)  10/16/13 233 lb 6.4 oz (105.87 kg)   Constitutional: he is obese, but appears well-developed and well-nourished. No distress.  Neck: Normal range of motion. Neck supple. No JVD present. No thyromegaly present.  Lungs: diminished breath sounds at bases, no wheeze or crackle. No increase WOB Cardiovascular: regular rate and rhythm, chronic 1+ edema bilateral  lower extremities with chronic venous changes Skin: No evidence of active cellulitis or ulceration. Nails diffusely with fungal changes and overgrown Musculoskeletal: chronic tophi changes, no acute gouty joint effusions; dressing C/D/I over R 5th finger (s/p partial amputation 12/2013 with table saw injury)  Lab Results  Component Value Date   WBC 6.2 09/23/2013   HGB 17.2* 09/23/2013   HCT 52.0 09/23/2013   PLT 141* 09/23/2013   GLUCOSE 121* 09/23/2013   CHOL 207* 02/06/2013   TRIG 315.0* 02/06/2013   HDL 28.70* 02/06/2013   LDLDIRECT 134.4 02/06/2013   LDLCALC  Value: 115        Total Cholesterol/HDL:CHD Risk Coronary Heart Disease Risk Table                     Men   Women  1/2 Average Risk   3.4   3.3  Average Risk       5.0   4.4  2 X Average Risk   9.6   7.1  3 X Average Risk  23.4   11.0        Use the calculated Patient Ratio above and the CHD Risk Table to determine the patient's CHD Risk.        ATP III CLASSIFICATION (LDL):  <100     mg/dL   Optimal  586-825  mg/dL   Near or Above                    Optimal  130-159  mg/dL   Borderline  160-189  mg/dL   High 161-096 >045>190     mg/dL   Very High* 4/09/81199/22/2011   ALT 19 03/01/2012   AST 27 03/01/2012   NA 140 09/23/2013   K 5.0 09/23/2013   CL 97 09/23/2013   CREATININE 1.61* 09/23/2013   BUN 38* 09/23/2013   CO2 31 09/23/2013   TSH 1.62 02/06/2013   INR 0.96 04/15/2010   HGBA1C 6.4 08/20/2013   MICROALBUR 1.0 02/06/2013    Dg Finger Little Right  12/25/2013   CLINICAL DATA:  Status post table saw injury involving the tip of the fifth digit  EXAM: RIGHT LITTLE FINGER 2+V  COMPARISON:  None.  FINDINGS: The patient has sustained partial amputation of the shaft of the distal phalanx. The ventral aspect of the phalanx is preserved but the dorsal 1/2 of the shaft is absent. The fracture extends to the DIP joint. The overlying soft tissues are avulsed. The proximal and middle phalanges are intact.  IMPRESSION: The patient has sustained partial amputation of the distal phalanx of  the right fifth finger. A fracture line extends into the DIP joint space.   Electronically Signed   By: David  [place]   On: 12/25/2013 13:26       Assessment & Plan:

## 2014-01-19 NOTE — Progress Notes (Signed)
Pre visit review using our clinic review tool, if applicable. No additional management support is needed unless otherwise documented below in the visit note. 

## 2014-01-19 NOTE — Assessment & Plan Note (Signed)
Intol of atorva and all statins sue to myalgias - therfore no statin therapy Recheck lipids now to monitor  The patient is asked to make an attempt to improve diet and exercise patterns to aid in medical management of this problem.

## 2014-01-22 ENCOUNTER — Other Ambulatory Visit: Payer: Self-pay

## 2014-01-22 NOTE — Telephone Encounter (Signed)
PA request for Lyrica sent again...clinical support for rx sent with signed PA.

## 2014-02-03 NOTE — Telephone Encounter (Signed)
No alternate for Lyrica Please let pt know same

## 2014-02-04 NOTE — Telephone Encounter (Signed)
Informed pt that there is not an alternative for Lyrica and that the PA was denied.

## 2014-02-04 NOTE — Telephone Encounter (Signed)
Ok for Lyrica 300mg  bid - please print so I may sign thanks

## 2014-02-05 ENCOUNTER — Other Ambulatory Visit: Payer: Self-pay

## 2014-02-05 MED ORDER — PREGABALIN 200 MG PO CAPS: 200.0000 mg | ORAL_CAPSULE | Freq: Two times a day (BID) | ORAL | Status: DC

## 2014-02-06 ENCOUNTER — Telehealth: Payer: Self-pay

## 2014-02-06 NOTE — Telephone Encounter (Signed)
New Rx Request: Lorazepam

## 2014-02-07 ENCOUNTER — Inpatient Hospital Stay (HOSPITAL_BASED_OUTPATIENT_CLINIC_OR_DEPARTMENT_OTHER)
Admission: EM | Admit: 2014-02-07 | Discharge: 2014-02-10 | DRG: 887 | Disposition: A | Discharge status: Home Health | Payer: Medicare Other | Attending: Internal Medicine | Admitting: Internal Medicine

## 2014-02-07 ENCOUNTER — Encounter (HOSPITAL_BASED_OUTPATIENT_CLINIC_OR_DEPARTMENT_OTHER): Payer: Self-pay | Admitting: Emergency Medicine

## 2014-02-07 ENCOUNTER — Emergency Department (HOSPITAL_BASED_OUTPATIENT_CLINIC_OR_DEPARTMENT_OTHER): Payer: Medicare Other

## 2014-02-07 DIAGNOSIS — I2510 Atherosclerotic heart disease of native coronary artery without angina pectoris: Secondary | ICD-10-CM | POA: Diagnosis present

## 2014-02-07 DIAGNOSIS — I252 Old myocardial infarction: Secondary | ICD-10-CM

## 2014-02-07 DIAGNOSIS — M1990 Unspecified osteoarthritis, unspecified site: Secondary | ICD-10-CM

## 2014-02-07 DIAGNOSIS — N183 Chronic kidney disease, stage 3 unspecified: Secondary | ICD-10-CM | POA: Diagnosis present

## 2014-02-07 DIAGNOSIS — I359 Nonrheumatic aortic valve disorder, unspecified: Secondary | ICD-10-CM | POA: Diagnosis present

## 2014-02-07 DIAGNOSIS — F3289 Other specified depressive episodes: Secondary | ICD-10-CM

## 2014-02-07 DIAGNOSIS — Z9181 History of falling: Secondary | ICD-10-CM | POA: Diagnosis not present

## 2014-02-07 DIAGNOSIS — N184 Chronic kidney disease, stage 4 (severe): Secondary | ICD-10-CM | POA: Diagnosis present

## 2014-02-07 DIAGNOSIS — Z7982 Long term (current) use of aspirin: Secondary | ICD-10-CM | POA: Diagnosis not present

## 2014-02-07 DIAGNOSIS — R0689 Other abnormalities of breathing: Secondary | ICD-10-CM

## 2014-02-07 DIAGNOSIS — Z801 Family history of malignant neoplasm of trachea, bronchus and lung: Secondary | ICD-10-CM

## 2014-02-07 DIAGNOSIS — E1159 Type 2 diabetes mellitus with other circulatory complications: Secondary | ICD-10-CM | POA: Diagnosis present

## 2014-02-07 DIAGNOSIS — F329 Major depressive disorder, single episode, unspecified: Secondary | ICD-10-CM | POA: Diagnosis present

## 2014-02-07 DIAGNOSIS — K219 Gastro-esophageal reflux disease without esophagitis: Secondary | ICD-10-CM | POA: Diagnosis present

## 2014-02-07 DIAGNOSIS — Z888 Allergy status to other drugs, medicaments and biological substances status: Secondary | ICD-10-CM | POA: Diagnosis not present

## 2014-02-07 DIAGNOSIS — Z9889 Other specified postprocedural states: Secondary | ICD-10-CM

## 2014-02-07 DIAGNOSIS — R0902 Hypoxemia: Secondary | ICD-10-CM | POA: Diagnosis present

## 2014-02-07 DIAGNOSIS — K2100 Gastro-esophageal reflux disease with esophagitis, without bleeding: Secondary | ICD-10-CM

## 2014-02-07 DIAGNOSIS — E785 Hyperlipidemia, unspecified: Secondary | ICD-10-CM

## 2014-02-07 DIAGNOSIS — I739 Peripheral vascular disease, unspecified: Secondary | ICD-10-CM

## 2014-02-07 DIAGNOSIS — I2584 Coronary atherosclerosis due to calcified coronary lesion: Secondary | ICD-10-CM

## 2014-02-07 DIAGNOSIS — R55 Syncope and collapse: Secondary | ICD-10-CM | POA: Diagnosis present

## 2014-02-07 DIAGNOSIS — Z8042 Family history of malignant neoplasm of prostate: Secondary | ICD-10-CM | POA: Diagnosis not present

## 2014-02-07 DIAGNOSIS — Z6834 Body mass index (BMI) 34.0-34.9, adult: Secondary | ICD-10-CM

## 2014-02-07 DIAGNOSIS — E669 Obesity, unspecified: Secondary | ICD-10-CM

## 2014-02-07 DIAGNOSIS — I999 Unspecified disorder of circulatory system: Secondary | ICD-10-CM | POA: Diagnosis present

## 2014-02-07 DIAGNOSIS — Z8673 Personal history of transient ischemic attack (TIA), and cerebral infarction without residual deficits: Secondary | ICD-10-CM | POA: Diagnosis not present

## 2014-02-07 DIAGNOSIS — F411 Generalized anxiety disorder: Secondary | ICD-10-CM

## 2014-02-07 DIAGNOSIS — N400 Enlarged prostate without lower urinary tract symptoms: Secondary | ICD-10-CM

## 2014-02-07 DIAGNOSIS — M519 Unspecified thoracic, thoracolumbar and lumbosacral intervertebral disc disorder: Secondary | ICD-10-CM

## 2014-02-07 DIAGNOSIS — L97909 Non-pressure chronic ulcer of unspecified part of unspecified lower leg with unspecified severity: Secondary | ICD-10-CM

## 2014-02-07 DIAGNOSIS — G4733 Obstructive sleep apnea (adult) (pediatric): Secondary | ICD-10-CM | POA: Diagnosis present

## 2014-02-07 DIAGNOSIS — I798 Other disorders of arteries, arterioles and capillaries in diseases classified elsewhere: Secondary | ICD-10-CM | POA: Diagnosis present

## 2014-02-07 DIAGNOSIS — I509 Heart failure, unspecified: Secondary | ICD-10-CM | POA: Diagnosis present

## 2014-02-07 DIAGNOSIS — Z833 Family history of diabetes mellitus: Secondary | ICD-10-CM

## 2014-02-07 DIAGNOSIS — G4710 Hypersomnia, unspecified: Principal | ICD-10-CM | POA: Diagnosis present

## 2014-02-07 DIAGNOSIS — M1A9XX1 Chronic gout, unspecified, with tophus (tophi): Secondary | ICD-10-CM

## 2014-02-07 DIAGNOSIS — Z8249 Family history of ischemic heart disease and other diseases of the circulatory system: Secondary | ICD-10-CM

## 2014-02-07 DIAGNOSIS — I872 Venous insufficiency (chronic) (peripheral): Secondary | ICD-10-CM

## 2014-02-07 DIAGNOSIS — Z7902 Long term (current) use of antithrombotics/antiplatelets: Secondary | ICD-10-CM | POA: Diagnosis not present

## 2014-02-07 DIAGNOSIS — E1129 Type 2 diabetes mellitus with other diabetic kidney complication: Secondary | ICD-10-CM | POA: Diagnosis present

## 2014-02-07 DIAGNOSIS — I129 Hypertensive chronic kidney disease with stage 1 through stage 4 chronic kidney disease, or unspecified chronic kidney disease: Secondary | ICD-10-CM | POA: Diagnosis present

## 2014-02-07 DIAGNOSIS — E872 Acidosis, unspecified: Secondary | ICD-10-CM | POA: Diagnosis present

## 2014-02-07 DIAGNOSIS — G2581 Restless legs syndrome: Secondary | ICD-10-CM | POA: Diagnosis present

## 2014-02-07 DIAGNOSIS — R296 Repeated falls: Secondary | ICD-10-CM

## 2014-02-07 DIAGNOSIS — I779 Disorder of arteries and arterioles, unspecified: Secondary | ICD-10-CM

## 2014-02-07 DIAGNOSIS — M109 Gout, unspecified: Secondary | ICD-10-CM | POA: Diagnosis present

## 2014-02-07 DIAGNOSIS — Z803 Family history of malignant neoplasm of breast: Secondary | ICD-10-CM | POA: Diagnosis not present

## 2014-02-07 DIAGNOSIS — I83009 Varicose veins of unspecified lower extremity with ulcer of unspecified site: Secondary | ICD-10-CM

## 2014-02-07 DIAGNOSIS — N189 Chronic kidney disease, unspecified: Secondary | ICD-10-CM | POA: Diagnosis present

## 2014-02-07 LAB — COMPREHENSIVE METABOLIC PANEL
ALT: 20 U/L (ref 0–53)
ANION GAP: 14 (ref 5–15)
AST: 29 U/L (ref 0–37)
Albumin: 3.8 g/dL (ref 3.5–5.2)
Alkaline Phosphatase: 122 U/L — ABNORMAL HIGH (ref 39–117)
BUN: 47 mg/dL — ABNORMAL HIGH (ref 6–23)
CO2: 28 mEq/L (ref 19–32)
CREATININE: 1.9 mg/dL — AB (ref 0.50–1.35)
Calcium: 9.2 mg/dL (ref 8.4–10.5)
Chloride: 102 mEq/L (ref 96–112)
GFR calc non Af Amer: 32 mL/min — ABNORMAL LOW (ref >90)
GFR, EST AFRICAN AMERICAN: 37 mL/min — AB (ref >90)
GLUCOSE: 147 mg/dL — AB (ref 70–99)
Potassium: 4.1 mEq/L (ref 3.7–5.3)
Sodium: 144 mEq/L (ref 137–147)
Total Bilirubin: 0.4 mg/dL (ref 0.3–1.2)
Total Protein: 7.7 g/dL (ref 6.0–8.3)

## 2014-02-07 LAB — CBC WITH DIFFERENTIAL/PLATELET
BASOS PCT: 0 % (ref 0–1)
Basophils Absolute: 0 10*3/uL (ref 0.0–0.1)
EOS ABS: 0.3 10*3/uL (ref 0.0–0.7)
EOS PCT: 4 % (ref 0–5)
HCT: 47.8 % (ref 39.0–52.0)
HEMOGLOBIN: 15.6 g/dL (ref 13.0–17.0)
LYMPHS ABS: 1.9 10*3/uL (ref 0.7–4.0)
Lymphocytes Relative: 26 % (ref 12–46)
MCH: 31.4 pg (ref 26.0–34.0)
MCHC: 32.6 g/dL (ref 30.0–36.0)
MCV: 96.2 fL (ref 78.0–100.0)
MONO ABS: 0.8 10*3/uL (ref 0.1–1.0)
Monocytes Relative: 11 % (ref 3–12)
Neutro Abs: 4.2 10*3/uL (ref 1.7–7.7)
Neutrophils Relative %: 59 % (ref 43–77)
Platelets: 135 10*3/uL — ABNORMAL LOW (ref 150–400)
RBC: 4.97 MIL/uL (ref 4.22–5.81)
RDW: 15.2 % (ref 11.5–15.5)
WBC: 7.1 10*3/uL (ref 4.0–10.5)

## 2014-02-07 LAB — I-STAT ARTERIAL BLOOD GAS, ED
ACID-BASE EXCESS: 6 mmol/L — AB (ref 0.0–2.0)
Bicarbonate: 32.9 mEq/L — ABNORMAL HIGH (ref 20.0–24.0)
O2 Saturation: 94 %
TCO2: 35 mmol/L (ref 0–100)
pCO2 arterial: 57 mmHg — ABNORMAL HIGH (ref 35.0–45.0)
pH, Arterial: 7.368 (ref 7.350–7.450)
pO2, Arterial: 76 mmHg — ABNORMAL LOW (ref 80.0–100.0)

## 2014-02-07 LAB — D-DIMER, QUANTITATIVE (NOT AT ARMC): D DIMER QUANT: 0.84 ug{FEU}/mL — AB (ref 0.00–0.48)

## 2014-02-07 LAB — PRO B NATRIURETIC PEPTIDE: PRO B NATRI PEPTIDE: 325.5 pg/mL (ref 0–450)

## 2014-02-07 LAB — TROPONIN I

## 2014-02-07 MED ORDER — ALBUTEROL SULFATE (2.5 MG/3ML) 0.083% IN NEBU
5.0000 mg | INHALATION_SOLUTION | Freq: Once | RESPIRATORY_TRACT | Status: AC
  Administered 2014-02-07: 5 mg via RESPIRATORY_TRACT
  Filled 2014-02-07: qty 6

## 2014-02-07 NOTE — ED Provider Notes (Signed)
TIME SEEN: 9:41 PM  CHIEF COMPLAINT: fall  HPI Comments: [name] is a 80 y.o. male with history of hypertension, CAD who presents to the Emergency Department complaining of lightheadedness, 2 falls in the past 24 hours. Patient is unable to tell me what aggravates or alleviates his lightheadedness. He denies that is positional. He states during one of his falls he didn't hit his head but did not lose consciousness. He is on Plavix. Patient denies any chest pain or chest discomfort. He states that he has had some shortness of breath but it is unclear if this is chronic for him or not. History is limited as he is a poor historian. Denies any fevers, cough, vomiting or diarrhea. No numbness, tingling or focal weakness. He is hypoxic in the emergency department and states he does not wear oxygen at home. Denies a history of COPD, asthma, prior history of tobacco use. No history of PE or DVT.  ROS: See HPI Constitutional: no fever  Eyes: no drainage  ENT: no runny nose   Cardiovascular:  no chest pain  Resp: SOB  GI: no vomiting GU: no dysuria Integumentary: no rash  Allergy: no hives  Musculoskeletal: no leg swelling  Neurological: no slurred speech ROS otherwise negative  PAST MEDICAL HISTORY/PAST SURGICAL HISTORY:  Past Medical History  Diagnosis Date  . Gout, unspecified     severe dz, "treatment done that last 15 years"  . OBESITY   . RESTLESS LEG SYNDROME   . DETACHMENT, RETINAL NOS 1994  . CATARACT NOS 2008  . HYPERTENSION   . CORONARY ARTERY DISEASE   . PERICARDITIS, ACUTE NEC 2007    MSSA, s/p pericardial window  . GERD   . DEGENERATIVE JOINT DISEASE   . CAROTID ENDARTERECTOMY, LEFT, HX OF   . OBSTRUCTIVE SLEEP APNEA     CPAP qhs  . TIA (transient ischemic attack) 2005  . BPH (benign prostatic hypertrophy)     "minor"  . Lumbar spinal stenosis   . Carotid artery occlusion   . Unspecified venous (peripheral) insufficiency   . Stroke May 2003  . MYOCARDIAL  INFARCTION 2004, 03/2010    "minor"  . Anxiety   . Depression     MEDICATIONS:  Prior to Admission medications   Medication Sig Start Date End Date Taking? Authorizing Provider  allopurinol (ZYLOPRIM) 300 MG tablet Take 1 tablet (300 mg total) by mouth daily. 10/08/13   Newt Lukes, MD  aspirin EC 81 MG tablet Take 81 mg by mouth daily.    Historical Provider, MD  buPROPion (WELLBUTRIN XL) 150 MG 24 hr tablet Take 1 tablet (150 mg total) by mouth every morning. 10/08/13   Newt Lukes, MD  Cholecalciferol (D-5000 MAXIMUM STRENGTH) 5000 UNITS capsule Take 5,000 Units by mouth daily.    Historical Provider, MD  clopidogrel (PLAVIX) 75 MG tablet Take 1 tablet (75 mg total) by mouth daily. 10/08/13   Newt Lukes, MD  Cyanocobalamin (B-12) 1000 MCG CAPS Take 1,000 mg by mouth daily.    Historical Provider, MD  furosemide (LASIX) 40 MG tablet Take 2 tablets (80 mg total) by mouth 2 (two) times daily. 10/08/13   Newt Lukes, MD  LORazepam (ATIVAN) 1 MG tablet Take 1 tablet (1 mg total) by mouth every morning. 10/08/13   Corwin Levins, MD  Magnesium 250 MG TABS Take 250 mg by mouth daily.     Historical Provider, MD  metoprolol (LOPRESSOR) 50 MG tablet Take 1 tablet (50  mg total) by mouth 2 (two) times daily. 10/08/13   Newt LukesValerie A Leschber, MD  mirabegron ER (MYRBETRIQ) 50 MG TB24 tablet Take 50 mg by mouth daily.    Historical Provider, MD  mirtazapine (REMERON) 15 MG tablet Take 1 tablet (15 mg total) by mouth at bedtime. 10/08/13   Newt LukesValerie A Leschber, MD  nitroGLYCERIN (NITROSTAT) 0.4 MG SL tablet Place 0.4 mg under the tongue every 5 (five) minutes as needed. For chest pain    Historical Provider, MD  Omega-3 Fatty Acids (FISH OIL) 1200 MG CAPS Take 1,200 mg by mouth daily.     Historical Provider, MD  oxyCODONE-acetaminophen (PERCOCET) 10-325 MG per tablet Take 1 tablet by mouth every 8 (eight) hours as needed for pain. 01/19/14   Newt LukesValerie A Leschber, MD  pantoprazole (PROTONIX)  40 MG tablet Take 1 tablet (40 mg total) by mouth daily. 10/08/13   Newt LukesValerie A Leschber, MD  potassium chloride SA (K-DUR,KLOR-CON) 20 MEQ tablet Take 1 tablet (20 mEq total) by mouth 2 (two) times daily. 10/08/13   Newt LukesValerie A Leschber, MD  pregabalin (LYRICA) 200 MG capsule Take 1 capsule (200 mg total) by mouth 2 (two) times daily. Take 1 capsule (200mg ) by mouth twice a day 02/05/14   Newt LukesValerie A Leschber, MD  rOPINIRole (REQUIP) 4 MG tablet Take 1 tablet (4 mg total) by mouth at bedtime. 10/08/13   Newt LukesValerie A Leschber, MD  tamsulosin (FLOMAX) 0.4 MG CAPS capsule Take 1 capsule (0.4 mg total) by mouth daily. 10/08/13   Newt LukesValerie A Leschber, MD    ALLERGIES:  Allergies  Allergen Reactions  . Atorvastatin     REACTION: tol simvastatin ok per pt ( Pt. Says ALL the Statins ) Severe muscle weakness    SOCIAL HISTORY:  History  Substance Use Topics  . Smoking status: Never Smoker   . Smokeless tobacco: Never Used  . Alcohol Use: No    FAMILY HISTORY: Family History  Problem Relation Age of Onset  . Heart disease Mother   . Diabetes Father   . Heart disease Father   . Breast cancer Sister   . Cancer Sister   . Prostate cancer Brother   . Lung cancer Brother   . Cancer Brother   . Diabetes Daughter   . Diabetes Son     EXAM: BP 120/98  Pulse 65  Temp(Src) 98.1 F (36.7 C) (Oral)  Resp 20  Ht 5\' 9"  (1.753 m)  Wt 235 lb (106.595 kg)  BMI 34.69 kg/m2  SpO2 95% CONSTITUTIONAL: Alert and oriented and responds appropriately to questions. Well-appearing; well-nourished; GCS 15, elderly, patient is drowsy but arousable to voice HEAD: Normocephalic; atraumatic EYES: Conjunctivae clear, PERRL, EOMI. Right pupil is 2mm. Left pupil is 4mm. patient reports is chronic ENT: normal nose; no rhinorrhea; moist mucous membranes; pharynx without lesions noted; no dental injury; no septal hematoma NECK: Supple, no meningismus, no LAD; no midline spinal tenderness, step-off or deformity CARD: RRR; S1  and S2 appreciated; loud systolic murmur heard diffusely, no clicks, no rubs, no gallops RESP: Normal chest excursion without splinting the patient has mild tachypnea, he is hypoxic on room air at rest, breath sounds clear and equal bilaterally; no wheezes, no rhonchi, no rales; chest wall stable, nontender to palpation, patient is mildly diminished at his bases ABD/GI: Normal bowel sounds; non-distended; soft, non-tender, no rebound, no guarding PELVIS:  stable, nontender to palpation BACK:  The back appears normal and is non-tender to palpation, there is no CVA tenderness; no  midline spinal tenderness, step-off or deformity EXT: Normal ROM in all joints; non-tender to palpation; no edema; normal capillary refill; no cyanosis    SKIN: Normal color for age and race; warm NEURO: Moves all extremities equally, sensation to light touch intact diffusely, cranial nerves II through XII intact, no pronator drift, strength 5/5 in all 4 extremities PSYCH: The patient's mood and manner are appropriate. Grooming and personal hygiene are appropriate.  MEDICAL DECISION MAKING: Patient here with recurrent falls, near syncope, hypoxia. Concern for pneumonia, CHF exacerbation, COPD, pulmonary embolus. We'll obtain labs, chest x-ray, ABG. We'll also obtain a CT of his head given he had a fall and head injury and is on Plavix. EKG shows no new ischemic changes.  ED PROGRESS: Patient's labs are unremarkable other than chronic kidney disease which is similar to his priors. Chest x-ray clear. BMP and troponin normal. CT head shows no acute injury. ABG show some CO2 retention but is well compensated. Discussed with Dr. Donna Bernard with hospitalist service at [facility] for admission given his new oxygen requirement. I feel the patient needs a VQ scan to rule out pulmonary embolus given his near syncope and hypoxia. I do not feel he should have a CT scan given his GFR is 32. No change in his oxygenation after 1 albuterol treatment.  I also feel he may need an echocardiogram that he has a systolic murmur on exam, there is no recent echocardiogram that I see in our system. Family comfortable with plan for admission. We'll admit to inpatient bed, telemetry per hospitalist request.    EKG Interpretation  Date/Time:  Saturday February 07 2014 21:09:45 EDT Ventricular Rate:  67 PR Interval:  206 QRS Duration: 118 QT Interval:  408 QTC Calculation: 431 R Axis:   -33 Text Interpretation:  Normal sinus rhythm Left axis deviation Left ventricular hypertrophy with QRS widening Abnormal ECG No significant change since last tracing Confirmed by Banita Lehn,  DO, [place] Volante ([id number]) on 02/07/2014 9:49:40 PM        Layla Maw Bryndle Corredor, DO 02/08/14 0031

## 2014-02-07 NOTE — ED Notes (Signed)
Pt reports onset of dizziness and weakness bilat lower extremities and had fall one day ago denies CP but admits to SOB alert to person and place

## 2014-02-07 NOTE — ED Notes (Signed)
Unable to obtain UA at this time - pt sleeping - EDP aware.

## 2014-02-08 ENCOUNTER — Inpatient Hospital Stay (HOSPITAL_COMMUNITY): Payer: Medicare Other

## 2014-02-08 ENCOUNTER — Encounter (HOSPITAL_COMMUNITY): Payer: Self-pay | Admitting: Internal Medicine

## 2014-02-08 DIAGNOSIS — I798 Other disorders of arteries, arterioles and capillaries in diseases classified elsewhere: Secondary | ICD-10-CM

## 2014-02-08 DIAGNOSIS — N183 Chronic kidney disease, stage 3 unspecified: Secondary | ICD-10-CM

## 2014-02-08 DIAGNOSIS — R55 Syncope and collapse: Secondary | ICD-10-CM

## 2014-02-08 DIAGNOSIS — E1159 Type 2 diabetes mellitus with other circulatory complications: Secondary | ICD-10-CM

## 2014-02-08 DIAGNOSIS — R0989 Other specified symptoms and signs involving the circulatory and respiratory systems: Secondary | ICD-10-CM

## 2014-02-08 DIAGNOSIS — R0902 Hypoxemia: Secondary | ICD-10-CM

## 2014-02-08 DIAGNOSIS — R296 Repeated falls: Secondary | ICD-10-CM

## 2014-02-08 DIAGNOSIS — R0609 Other forms of dyspnea: Secondary | ICD-10-CM

## 2014-02-08 HISTORY — DX: Chronic kidney disease, stage 3 unspecified: N18.30

## 2014-02-08 LAB — BLOOD GAS, ARTERIAL
Acid-Base Excess: 4.7 mmol/L — ABNORMAL HIGH (ref 0.0–2.0)
Bicarbonate: 30.1 mEq/L — ABNORMAL HIGH (ref 20.0–24.0)
Drawn by: 405301
O2 CONTENT: 2 L/min
O2 SAT: 93.5 %
PH ART: 7.346 — AB (ref 7.350–7.450)
PO2 ART: 73.2 mmHg — AB (ref 80.0–100.0)
Patient temperature: 98.6
TCO2: 31.8 mmol/L (ref 0–100)
pCO2 arterial: 56.5 mmHg — ABNORMAL HIGH (ref 35.0–45.0)

## 2014-02-08 LAB — COMPREHENSIVE METABOLIC PANEL
ALT: 18 U/L (ref 0–53)
ANION GAP: 12 (ref 5–15)
AST: 25 U/L (ref 0–37)
Albumin: 3.5 g/dL (ref 3.5–5.2)
Alkaline Phosphatase: 109 U/L (ref 39–117)
BILIRUBIN TOTAL: 0.3 mg/dL (ref 0.3–1.2)
BUN: 43 mg/dL — AB (ref 6–23)
CALCIUM: 9 mg/dL (ref 8.4–10.5)
CO2: 32 mEq/L (ref 19–32)
CREATININE: 1.76 mg/dL — AB (ref 0.50–1.35)
Chloride: 103 mEq/L (ref 96–112)
GFR calc Af Amer: 41 mL/min — ABNORMAL LOW (ref >90)
GFR calc non Af Amer: 35 mL/min — ABNORMAL LOW (ref >90)
Glucose, Bld: 129 mg/dL — ABNORMAL HIGH (ref 70–99)
Potassium: 4.3 mEq/L (ref 3.7–5.3)
Sodium: 147 mEq/L (ref 137–147)
TOTAL PROTEIN: 7.2 g/dL (ref 6.0–8.3)

## 2014-02-08 LAB — GLUCOSE, CAPILLARY
Glucose-Capillary: 142 mg/dL — ABNORMAL HIGH (ref 70–99)
Glucose-Capillary: 106 mg/dL — ABNORMAL HIGH (ref 70–99)
Glucose-Capillary: 127 mg/dL — ABNORMAL HIGH (ref 70–99)
Glucose-Capillary: 137 mg/dL — ABNORMAL HIGH (ref 70–99)
Glucose-Capillary: 120 mg/dL — ABNORMAL HIGH (ref 70–99)

## 2014-02-08 LAB — CBC
HEMATOCRIT: 48 % (ref 39.0–52.0)
HEMOGLOBIN: 15.2 g/dL (ref 13.0–17.0)
MCH: 31.3 pg (ref 26.0–34.0)
MCHC: 31.7 g/dL (ref 30.0–36.0)
MCV: 98.8 fL (ref 78.0–100.0)
Platelets: 112 10*3/uL — ABNORMAL LOW (ref 150–400)
RBC: 4.86 MIL/uL (ref 4.22–5.81)
RDW: 15.4 % (ref 11.5–15.5)
WBC: 5.6 10*3/uL (ref 4.0–10.5)

## 2014-02-08 LAB — TROPONIN I: Troponin I: <0.3 ng/mL (ref <0.30)

## 2014-02-08 LAB — URINALYSIS, ROUTINE W REFLEX MICROSCOPIC
Bilirubin Urine: NEGATIVE
Glucose, UA: NEGATIVE mg/dL
HGB URINE DIPSTICK: NEGATIVE
Ketones, ur: NEGATIVE mg/dL
NITRITE: NEGATIVE
Protein, ur: NEGATIVE mg/dL
Specific Gravity, Urine: 1.013 (ref 1.005–1.030)
UROBILINOGEN UA: 1 mg/dL (ref 0.0–1.0)
pH: 5.5 (ref 5.0–8.0)

## 2014-02-08 LAB — MAGNESIUM: MAGNESIUM: 2.5 mg/dL (ref 1.5–2.5)

## 2014-02-08 LAB — URINE MICROSCOPIC-ADD ON

## 2014-02-08 LAB — HEMOGLOBIN A1C
HEMOGLOBIN A1C: 6.1 % — AB (ref <5.7)
MEAN PLASMA GLUCOSE: 128 mg/dL — AB (ref <117)

## 2014-02-08 LAB — PHOSPHORUS: Phosphorus: 4.7 mg/dL — ABNORMAL HIGH (ref 2.3–4.6)

## 2014-02-08 LAB — TSH: TSH: 1.54 u[IU]/mL (ref 0.350–4.500)

## 2014-02-08 MED ORDER — INSULIN ASPART 100 UNIT/ML ~~LOC~~ SOLN
0.0000 [IU] | SUBCUTANEOUS | Status: DC
Start: 2014-02-08 — End: 2014-02-10
  Administered 2014-02-08 – 2014-02-09 (×3): 1 [IU] via SUBCUTANEOUS

## 2014-02-08 MED ORDER — ACETAMINOPHEN 325 MG PO TABS: 650.0000 mg | ORAL_TABLET | Freq: Four times a day (QID) | ORAL | Status: DC | PRN

## 2014-02-08 MED ORDER — ROPINIROLE HCL 1 MG PO TABS
4.0000 mg | ORAL_TABLET | Freq: Every day | ORAL | Status: DC
  Administered 2014-02-08 – 2014-02-09 (×2): 4 mg via ORAL
  Filled 2014-02-08 (×3): qty 4

## 2014-02-08 MED ORDER — ONDANSETRON HCL 4 MG PO TABS: 4.0000 mg | ORAL_TABLET | Freq: Four times a day (QID) | ORAL | Status: DC | PRN

## 2014-02-08 MED ORDER — SODIUM CHLORIDE 0.9 % IJ SOLN
3.0000 mL | Freq: Two times a day (BID) | INTRAMUSCULAR | Status: DC
  Administered 2014-02-08 – 2014-02-09 (×3): 3 mL via INTRAVENOUS

## 2014-02-08 MED ORDER — IPRATROPIUM BROMIDE 0.02 % IN SOLN
0.5000 mg | Freq: Four times a day (QID) | RESPIRATORY_TRACT | Status: DC
  Administered 2014-02-08 – 2014-02-10 (×8): 0.5 mg via RESPIRATORY_TRACT
  Filled 2014-02-08 (×8): qty 2.5

## 2014-02-08 MED ORDER — FUROSEMIDE 80 MG PO TABS
80.0000 mg | ORAL_TABLET | Freq: Every day | ORAL | Status: DC
  Administered 2014-02-08 – 2014-02-10 (×3): 80 mg via ORAL
  Filled 2014-02-08 (×3): qty 1

## 2014-02-08 MED ORDER — BUPROPION HCL ER (XL) 150 MG PO TB24
150.0000 mg | ORAL_TABLET | Freq: Every morning | ORAL | Status: DC
  Administered 2014-02-08 – 2014-02-10 (×3): 150 mg via ORAL
  Filled 2014-02-08 (×3): qty 1

## 2014-02-08 MED ORDER — NALOXONE HCL 0.4 MG/ML IJ SOLN
0.4000 mg | INTRAMUSCULAR | Status: DC | PRN
  Administered 2014-02-08: 0.4 mg via INTRAVENOUS
  Filled 2014-02-08: qty 1

## 2014-02-08 MED ORDER — CLOPIDOGREL BISULFATE 75 MG PO TABS
75.0000 mg | ORAL_TABLET | Freq: Every day | ORAL | Status: DC
  Administered 2014-02-08 – 2014-02-10 (×3): 75 mg via ORAL
  Filled 2014-02-08 (×3): qty 1

## 2014-02-08 MED ORDER — METOPROLOL TARTRATE 50 MG PO TABS
50.0000 mg | ORAL_TABLET | Freq: Two times a day (BID) | ORAL | Status: DC
  Administered 2014-02-08 – 2014-02-10 (×5): 50 mg via ORAL
  Filled 2014-02-08 (×6): qty 1

## 2014-02-08 MED ORDER — ASPIRIN EC 81 MG PO TBEC
81.0000 mg | DELAYED_RELEASE_TABLET | Freq: Every day | ORAL | Status: DC
  Administered 2014-02-08 – 2014-02-10 (×3): 81 mg via ORAL
  Filled 2014-02-08 (×3): qty 1

## 2014-02-08 MED ORDER — TECHNETIUM TO 99M ALBUMIN AGGREGATED
6.0000 | Freq: Once | INTRAVENOUS | Status: AC | PRN
  Administered 2014-02-08: 6 via INTRAVENOUS

## 2014-02-08 MED ORDER — SODIUM CHLORIDE 0.9 % IJ SOLN: 3.0000 mL | INTRAMUSCULAR | Status: DC | PRN

## 2014-02-08 MED ORDER — ALBUTEROL SULFATE (2.5 MG/3ML) 0.083% IN NEBU: 2.5000 mg | INHALATION_SOLUTION | RESPIRATORY_TRACT | Status: DC | PRN

## 2014-02-08 MED ORDER — TECHNETIUM TC 99M DIETHYLENETRIAME-PENTAACETIC ACID: 40.0000 | Freq: Once | INTRAVENOUS | Status: AC | PRN

## 2014-02-08 MED ORDER — LORAZEPAM 1 MG PO TABS: 1.0000 mg | ORAL_TABLET | Freq: Every morning | ORAL | Status: DC

## 2014-02-08 MED ORDER — ACETAMINOPHEN 650 MG RE SUPP
650.0000 mg | Freq: Four times a day (QID) | RECTAL | Status: DC | PRN
Start: 2014-02-08 — End: 2014-02-10

## 2014-02-08 MED ORDER — NALOXONE HCL 0.4 MG/ML IJ SOLN: 0.2000 mg | INTRAMUSCULAR | Status: DC | PRN

## 2014-02-08 MED ORDER — MIRABEGRON ER 50 MG PO TB24
50.0000 mg | ORAL_TABLET | Freq: Every day | ORAL | Status: DC
  Administered 2014-02-08 – 2014-02-10 (×3): 50 mg via ORAL
  Filled 2014-02-08 (×3): qty 1

## 2014-02-08 MED ORDER — HYDROCODONE-ACETAMINOPHEN 5-325 MG PO TABS: 1.0000 | ORAL_TABLET | ORAL | Status: DC | PRN

## 2014-02-08 MED ORDER — SODIUM CHLORIDE 0.9 % IJ SOLN
3.0000 mL | Freq: Two times a day (BID) | INTRAMUSCULAR | Status: DC
  Administered 2014-02-08: 3 mL via INTRAVENOUS

## 2014-02-08 MED ORDER — ALLOPURINOL 300 MG PO TABS
300.0000 mg | ORAL_TABLET | Freq: Every day | ORAL | Status: DC
  Administered 2014-02-08 – 2014-02-10 (×3): 300 mg via ORAL
  Filled 2014-02-08 (×3): qty 1

## 2014-02-08 MED ORDER — PREGABALIN 75 MG PO CAPS
75.0000 mg | ORAL_CAPSULE | Freq: Two times a day (BID) | ORAL | Status: DC
  Administered 2014-02-08 – 2014-02-10 (×5): 75 mg via ORAL
  Filled 2014-02-08 (×5): qty 1

## 2014-02-08 MED ORDER — MIRTAZAPINE 15 MG PO TABS
15.0000 mg | ORAL_TABLET | Freq: Every day | ORAL | Status: DC
  Administered 2014-02-08 – 2014-02-09 (×2): 15 mg via ORAL
  Filled 2014-02-08 (×3): qty 1

## 2014-02-08 MED ORDER — ENOXAPARIN SODIUM 30 MG/0.3ML ~~LOC~~ SOLN
30.0000 mg | SUBCUTANEOUS | Status: DC
  Filled 2014-02-08: qty 0.3

## 2014-02-08 MED ORDER — PANTOPRAZOLE SODIUM 40 MG PO TBEC
40.0000 mg | DELAYED_RELEASE_TABLET | Freq: Every day | ORAL | Status: DC
  Administered 2014-02-08 – 2014-02-10 (×3): 40 mg via ORAL
  Filled 2014-02-08 (×3): qty 1

## 2014-02-08 MED ORDER — ENOXAPARIN SODIUM 40 MG/0.4ML ~~LOC~~ SOLN
40.0000 mg | SUBCUTANEOUS | Status: DC
  Administered 2014-02-08 – 2014-02-09 (×2): 40 mg via SUBCUTANEOUS
  Filled 2014-02-08 (×3): qty 0.4

## 2014-02-08 MED ORDER — TAMSULOSIN HCL 0.4 MG PO CAPS
0.4000 mg | ORAL_CAPSULE | Freq: Every day | ORAL | Status: DC
  Administered 2014-02-08 – 2014-02-10 (×3): 0.4 mg via ORAL
  Filled 2014-02-08 (×3): qty 1

## 2014-02-08 MED ORDER — ONDANSETRON HCL 4 MG/2ML IJ SOLN: 4.0000 mg | Freq: Four times a day (QID) | INTRAMUSCULAR | Status: DC | PRN

## 2014-02-08 MED ORDER — GUAIFENESIN ER 600 MG PO TB12
600.0000 mg | ORAL_TABLET | Freq: Two times a day (BID) | ORAL | Status: DC
  Administered 2014-02-08 – 2014-02-10 (×5): 600 mg via ORAL
  Filled 2014-02-08 (×6): qty 1

## 2014-02-08 MED ORDER — SODIUM CHLORIDE 0.9 % IV SOLN: 250.0000 mL | INTRAVENOUS | Status: DC | PRN

## 2014-02-08 MED ORDER — DOCUSATE SODIUM 100 MG PO CAPS
100.0000 mg | ORAL_CAPSULE | Freq: Two times a day (BID) | ORAL | Status: DC
  Administered 2014-02-08 – 2014-02-10 (×5): 100 mg via ORAL
  Filled 2014-02-08 (×6): qty 1

## 2014-02-08 NOTE — Progress Notes (Signed)
Pt seen and examined, admitted this am per Dr.Doutova, pls see H&P for details 81/M with  1. Dizziness/weakness/hypersomnolence -multifactorial -pt reports this is due to not getting sleep for 2days due to RLS -sedating meds minimized, CT head benign -PT eval -check ECHO -monitor on tele  2. Hypoxia -likely multifactorial due to Obesity, OSA, meds -ABG with hypercarbia-chronic, but compensated -wean O2, VQ scan today due to elevated d-dimer -very low suspicion for PE  3. CKD -stable, lasix resumed at lower dose  DVT proph: lovenox  Pt/OT, Ambulate  Zannie Cove, MD [phone]

## 2014-02-08 NOTE — H&P (Addendum)
PCP:   [name], MD    Chief Complaint:  Frequent falls  HPI: [name] is a 80 y.o. male   has a past medical history of Gout, unspecified; OBESITY; RESTLESS LEG SYNDROME; DETACHMENT, RETINAL NOS (1994); CATARACT NOS (2008); HYPERTENSION; CORONARY ARTERY DISEASE; PERICARDITIS, ACUTE NEC (2007); GERD; DEGENERATIVE JOINT DISEASE; CAROTID ENDARTERECTOMY, LEFT, HX OF; OBSTRUCTIVE SLEEP APNEA; TIA (transient ischemic attack) (2005); BPH (benign prostatic hypertrophy); Lumbar spinal stenosis; Carotid artery occlusion; Unspecified venous (peripheral) insufficiency; Stroke (May 2003); MYOCARDIAL INFARCTION (2004, 03/2010); Anxiety; and Depression.   Presented with  patitn is lethargic and unable to provide hx no family at bedside. Per notes he has had some frequent falls and abrasions to his shins. He was brought to [facility] and was found to be mildly hypoxic. Patient states he has OSA and has CPAP at home although does not always wear it. Patietn states he lives at home. He have not slept well for the past few days and states currently is very sleepy. ABG was done showing compensated hypercarbic acidosis. PCO2 59 Patietn states he is always short of breath denies any chest pain. Reports that he never smoked. PAtietn has hx of aortic stenosis and CHF for which he is on lasix.  Patient initially was seen at [facility] and was then transferred to [place].  Hospitalist was called for admission for hypoxia  Review of Systems:    Pertinent positives include: frequent falls fatigue,   Constitutional:  No weight loss, night sweats, Fevers, chills,weight loss  HEENT:  No headaches, Difficulty swallowing,Tooth/dental problems,Sore throat,  No sneezing, itching, ear ache, nasal congestion, post nasal drip,  Cardio-vascular:  No chest pain, Orthopnea, PND, anasarca, dizziness, palpitations.no Bilateral lower extremity swelling  GI:  No heartburn, indigestion, abdominal pain, nausea, vomiting, diarrhea,  change in bowel habits, loss of appetite, melena, blood in stool, hematemesis Resp:  no shortness of breath at rest. No dyspnea on exertion, No excess mucus, no productive cough, No non-productive cough, No coughing up of blood.No change in color of mucus.No wheezing. Skin:  no rash or lesions. No jaundice GU:  no dysuria, change in color of urine, no urgency or frequency. No straining to urinate.  No flank pain.  Musculoskeletal:  No joint pain or no joint swelling. No decreased range of motion. No back pain.  Psych:  No change in mood or affect. No depression or anxiety. No memory loss.  Neuro: no localizing neurological complaints, no tingling, no weakness, no double vision, no gait abnormality, no slurred speech, no confusion  Otherwise ROS are negative except for above, 10 systems were reviewed  Past Medical History: Past Medical History  Diagnosis Date  . Gout, unspecified     severe dz, "treatment done that last 15 years"  . OBESITY   . RESTLESS LEG SYNDROME   . DETACHMENT, RETINAL NOS 1994  . CATARACT NOS 2008  . HYPERTENSION   . CORONARY ARTERY DISEASE   . PERICARDITIS, ACUTE NEC 2007    MSSA, s/p pericardial window  . GERD   . DEGENERATIVE JOINT DISEASE   . CAROTID ENDARTERECTOMY, LEFT, HX OF   . OBSTRUCTIVE SLEEP APNEA     CPAP qhs  . TIA (transient ischemic attack) 2005  . BPH (benign prostatic hypertrophy)     "minor"  . Lumbar spinal stenosis   . Carotid artery occlusion   . Unspecified venous (peripheral) insufficiency   . Stroke May 2003  . MYOCARDIAL INFARCTION 2004, 03/2010    "minor"  .  Anxiety   . Depression    Past Surgical History  Procedure Laterality Date  . Pericardial window  2007  . Left arm  2008    shoulder  . Right arm  1970's    shoulder  . Cataract extraction      Left side x's 2 and right  . Retinal detachment surgery      left side  . Carotid endarterectomy Left 12-06-05    cea  . Coronary angioplasty  01/2009    x 1 stent  .  Colonoscopy    . Carpal tunnel release Bilateral   . Back surgery  2013    removed bone spurs  . Inguinal hernia repair Left 10/01/2013    Procedure: HERNIA REPAIR INGUINAL ADULT;  Surgeon: Axel Filler, MD;  Location: [facility];  Service: General;  Laterality: Left;  . Insertion of mesh Left 10/01/2013    Procedure: INSERTION OF MESH;  Surgeon: Axel Filler, MD;  Location: [facility];  Service: General;  Laterality: Left;     Medications: Prior to Admission medications   Medication Sig Start Date End Date Taking? Authorizing Provider  allopurinol (ZYLOPRIM) 300 MG tablet Take 1 tablet (300 mg total) by mouth daily. 10/08/13   Newt Lukes, MD  aspirin EC 81 MG tablet Take 81 mg by mouth daily.    Historical Provider, MD  buPROPion (WELLBUTRIN XL) 150 MG 24 hr tablet Take 1 tablet (150 mg total) by mouth every morning. 10/08/13   Newt Lukes, MD  Cholecalciferol (D-5000 MAXIMUM STRENGTH) 5000 UNITS capsule Take 5,000 Units by mouth daily.    Historical Provider, MD  clopidogrel (PLAVIX) 75 MG tablet Take 1 tablet (75 mg total) by mouth daily. 10/08/13   Newt Lukes, MD  Cyanocobalamin (B-12) 1000 MCG CAPS Take 1,000 mg by mouth daily.    Historical Provider, MD  furosemide (LASIX) 40 MG tablet Take 2 tablets (80 mg total) by mouth 2 (two) times daily. 10/08/13   Newt Lukes, MD  LORazepam (ATIVAN) 1 MG tablet Take 1 tablet (1 mg total) by mouth every morning. 10/08/13   Corwin Levins, MD  Magnesium 250 MG TABS Take 250 mg by mouth daily.     Historical Provider, MD  metoprolol (LOPRESSOR) 50 MG tablet Take 1 tablet (50 mg total) by mouth 2 (two) times daily. 10/08/13   Newt Lukes, MD  mirabegron ER (MYRBETRIQ) 50 MG TB24 tablet Take 50 mg by mouth daily.    Historical Provider, MD  mirtazapine (REMERON) 15 MG tablet Take 1 tablet (15 mg total) by mouth at bedtime. 10/08/13   Newt Lukes, MD  nitroGLYCERIN (NITROSTAT) 0.4 MG SL tablet Place 0.4 mg under the  tongue every 5 (five) minutes as needed. For chest pain    Historical Provider, MD  Omega-3 Fatty Acids (FISH OIL) 1200 MG CAPS Take 1,200 mg by mouth daily.     Historical Provider, MD  oxyCODONE-acetaminophen (PERCOCET) 10-325 MG per tablet Take 1 tablet by mouth every 8 (eight) hours as needed for pain. 01/19/14   Newt Lukes, MD  pantoprazole (PROTONIX) 40 MG tablet Take 1 tablet (40 mg total) by mouth daily. 10/08/13   Newt Lukes, MD  potassium chloride SA (K-DUR,KLOR-CON) 20 MEQ tablet Take 1 tablet (20 mEq total) by mouth 2 (two) times daily. 10/08/13   Newt Lukes, MD  pregabalin (LYRICA) 200 MG capsule Take 1 capsule (200 mg total) by mouth 2 (two) times daily. Take  1 capsule (200mg ) by mouth twice a day 02/05/14   Newt LukesValerie A Leschber, MD  rOPINIRole (REQUIP) 4 MG tablet Take 1 tablet (4 mg total) by mouth at bedtime. 10/08/13   Newt LukesValerie A Leschber, MD  tamsulosin (FLOMAX) 0.4 MG CAPS capsule Take 1 capsule (0.4 mg total) by mouth daily. 10/08/13   Newt LukesValerie A Leschber, MD    Allergies:   Allergies  Allergen Reactions  . Atorvastatin     REACTION: tol simvastatin ok per pt ( Pt. Says ALL the Statins ) Severe muscle weakness    Social History:  Ambulatory  independently   Lives at home alone      reports that he has never smoked. He has never used smokeless tobacco. He reports that he does not drink alcohol or use illicit drugs.    Family History: family history includes Breast cancer in his sister; Cancer in his brother and sister; Diabetes in his daughter, father, and son; Heart disease in his father and mother; Lung cancer in his brother; Prostate cancer in his brother.    Physical Exam: Patient Vitals for the past 24 hrs:  BP Temp Temp src Pulse Resp SpO2 Height Weight  02/08/14 0234 115/51 mmHg 97.4 F (36.3 C) - 89 21 96 % 5\' 9"  (1.753 m) 107.366 kg (236 lb 11.2 oz)  02/08/14 0100 145/56 mmHg - - 57 - 98 % - -  02/08/14 0000 137/54 mmHg - - 60 - 99 % - -   02/07/14 2330 129/60 mmHg - - 57 19 96 % - -  02/07/14 2302 - - - - - 96 % - -  02/07/14 2300 132/52 mmHg - - 58 - 95 % - -  02/07/14 2230 141/54 mmHg - - 59 - 94 % - -  02/07/14 2122 120/98 mmHg - - 65 20 95 % - -  02/07/14 2119 122/42 mmHg 98.1 F (36.7 C) Oral 65 20 89 % 5\' 9"  (1.753 m) 106.595 kg (235 lb)  02/07/14 2115 120/98 mmHg - - 64 21 93 % - -    1. General:  in No Acute distress 2. Psychological: somnolent but Oriented 3. Head/ENT:   Moist   Mucous Membranes                          Head Non traumatic, neck supple                          Normal Dentition 4. SKIN: normal   Skin turgor,  Skin clean Dry abrasion to the shins bilaterally, gouty tophi present 5. Heart: Regular rate and rhythm loud systolic Murmur, Rub or gallop 6. Lungs:  no wheezes or crackles   7. Abdomen: Soft, non-tender, Non distended,obese 8. Lower extremities: no clubbing, cyanosis, or edema 9. Neurologically strength 5/5 in all 4 ext. CN 2-12 intact 10. MSK: Normal range of motion  body mass index is 34.94 kg/(m^2).   Labs on Admission:   Recent Labs  02/07/14 2100  NA 144  K 4.1  CL 102  CO2 28  GLUCOSE 147*  BUN 47*  CREATININE 1.90*  CALCIUM 9.2    Recent Labs  02/07/14 2100  AST 29  ALT 20  ALKPHOS 122*  BILITOT 0.4  PROT 7.7  ALBUMIN 3.8   No results found for this basename: LIPASE, AMYLASE,  in the last 72 hours  Recent Labs  02/07/14 2100  WBC 7.1  NEUTROABS 4.2  HGB 15.6  HCT 47.8  MCV 96.2  PLT 135*    Recent Labs  02/07/14 2100  TROPONINI <0.30   No results found for this basename: TSH, T4TOTAL, FREET3, T3FREE, THYROIDAB,  in the last 72 hours No results found for this basename: VITAMINB12, FOLATE, FERRITIN, TIBC, IRON, RETICCTPCT,  in the last 72 hours Lab Results  Component Value Date   HGBA1C 6.2 01/19/2014    Estimated Creatinine Clearance: 38.1 ml/min (by C-G formula based on Cr of 1.9). ABG    Component Value Date/Time   PHART 7.368  02/07/2014 2241   HCO3 32.9* 02/07/2014 2241   TCO2 35 02/07/2014 2241   O2SAT 94.0 02/07/2014 2241     Lab Results  Component Value Date   DDIMER 0.84* 02/07/2014     Other results:  I have pearsonaly reviewed this: ECG REPORT  Rate: 67  Rhythm: SR with evidence of hypertrophy ST&T Change: no ischemic changes   BNP (last 3 results)  Recent Labs  02/07/14 2148  PROBNP 325.5    Filed Weights   02/07/14 2119 02/08/14 0234  Weight: 106.595 kg (235 lb) 107.366 kg (236 lb 11.2 oz)     Cultures: No results found for this basename: sdes, specrequest, cult, reptstatus    Radiological Exams on Admission: Dg Chest 2 View  02/07/2014   CLINICAL DATA:  Fall.  EXAM: CHEST  2 VIEW  COMPARISON:  September 05, 2013.  FINDINGS: The heart size and mediastinal contours are within normal limits. Both lungs are clear. No pneumothorax or pleural effusion is noted. The visualized skeletal structures are unremarkable.  IMPRESSION: No acute cardiopulmonary abnormality seen.   Electronically Signed   By: Roque Lias M.D.   On: 02/07/2014 21:42   Ct Head Wo Contrast  02/07/2014   CLINICAL DATA:  Dizziness and weakness. Recent fall. Shortness of breath and fatigue.  EXAM: CT HEAD WITHOUT CONTRAST  TECHNIQUE: Contiguous axial images were obtained from the base of the skull through the vertex without intravenous contrast.  COMPARISON:  MRI of the brain performed 11/15/2009, and CT of the head performed 09/13/2012  FINDINGS: There is no evidence of acute infarction, mass lesion, or intra- or extra-axial hemorrhage on CT.  Scattered periventricular and subcortical white matter change likely reflects small vessel ischemic microangiopathy.  The posterior fossa, including the cerebellum, brainstem and fourth ventricle, is within normal limits. The third and lateral ventricles, and basal ganglia are unremarkable in appearance. The cerebral hemispheres are symmetric in appearance, with normal gray-white  differentiation. No mass effect or midline shift is seen.  There is no evidence of fracture; visualized osseous structures are unremarkable in appearance. The orbits are within normal limits. The paranasal sinuses and mastoid air cells are well-aerated. No significant soft tissue abnormalities are seen.  IMPRESSION: 1. No evidence of traumatic intracranial injury or fracture. 2. Mild small vessel ischemic microangiopathy noted.   Electronically Signed   By: Roanna Raider M.D.   On: 02/07/2014 22:05    Chart has been reviewed  Assessment/Plan  80 yo M with hx of aortic stenosis who presents with frequent falls and possible near syncopal episodes in the setting of hypoxia and increased somnolence.  Admitted for hypoxia work up  Present on Admission:  . Hypoxia - etiology likely multifactorial, patient with hx of OSA and increased somnolence, will restart CPAP, given risk factors of advanced age and elevated d.dimer will obtain vq scan in AM . Type 2 diabetes mellitus with vascular  disease - SSI . Restless leg syndrome -continue homemeds . Obstructive sleep apnea, adult - will write for CPAP, he has some hypercapnic component but it is well compensated . CAD (coronary artery disease) - cycle CE given hx of CAD, continue home meds Hypersomnolence - will hold home sedating meds, given hx of narcotics use will try to give narcan (produced some response), repeat ABG to make sure hypercarbia is not worsening. Hold narcotics Near syncope given hx of carotid artery stenosis will obtain echo, cycle CE CKD somewhat worse from baseline will decrease lasix and watch, check orthostatics to make sure he is not overdiuresed Prophylaxis:  Lovenox, Protonix  CODE STATUS:  FULL CODE presumed to be will need to clarify in AM when patient is more alert  Other plan as per orders.  I have spent a total of 55 min on this admission  Kayann Maj 02/08/2014, 4:10 AM  [facility]  Pager [phone]     If 7AM-7PM, please contact the day team taking care of the patient  [email or web address]  Password [email or web address]

## 2014-02-08 NOTE — Progress Notes (Signed)
Utilization review completed.  

## 2014-02-08 NOTE — Progress Notes (Signed)
  Echocardiogram 2D Echocardiogram has been performed.  Georgian Co 02/08/2014, 10:45 AM

## 2014-02-08 NOTE — Progress Notes (Signed)
*  PRELIMINARY RESULTS* Vascular Ultrasound Carotid Duplex (Doppler) has been completed.  Study was technically difficult due to patient body habitus and poor patient cooperation. Findings suggest 1-39% right internal carotid artery stenosis. The left internal carotid artery exhibits elevated peak systolic velocities suggestive of upper range 1-39% stenosis. The left external carotid artery demonstrates elevated velocities. The right vertebral artery is patent with antegrade flow, unable to visualize the left vertebral artery.  02/08/2014 3:23 PM Gertie Fey, RVT, RDCS, RDMS

## 2014-02-09 DIAGNOSIS — N184 Chronic kidney disease, stage 4 (severe): Secondary | ICD-10-CM

## 2014-02-09 DIAGNOSIS — I2584 Coronary atherosclerosis due to calcified coronary lesion: Secondary | ICD-10-CM

## 2014-02-09 DIAGNOSIS — I2510 Atherosclerotic heart disease of native coronary artery without angina pectoris: Secondary | ICD-10-CM

## 2014-02-09 DIAGNOSIS — I359 Nonrheumatic aortic valve disorder, unspecified: Secondary | ICD-10-CM

## 2014-02-09 LAB — COMPREHENSIVE METABOLIC PANEL
ALK PHOS: 97 U/L (ref 39–117)
ALT: 15 U/L (ref 0–53)
ANION GAP: 11 (ref 5–15)
AST: 20 U/L (ref 0–37)
Albumin: 3.3 g/dL — ABNORMAL LOW (ref 3.5–5.2)
BUN: 40 mg/dL — AB (ref 6–23)
CO2: 31 mEq/L (ref 19–32)
Calcium: 9.5 mg/dL (ref 8.4–10.5)
Chloride: 101 mEq/L (ref 96–112)
Creatinine, Ser: 1.58 mg/dL — ABNORMAL HIGH (ref 0.50–1.35)
GFR calc non Af Amer: 40 mL/min — ABNORMAL LOW (ref >90)
GFR, EST AFRICAN AMERICAN: 46 mL/min — AB (ref >90)
GLUCOSE: 131 mg/dL — AB (ref 70–99)
POTASSIUM: 4.3 meq/L (ref 3.7–5.3)
Sodium: 143 mEq/L (ref 137–147)
Total Bilirubin: 0.5 mg/dL (ref 0.3–1.2)
Total Protein: 7.2 g/dL (ref 6.0–8.3)

## 2014-02-09 LAB — GLUCOSE, CAPILLARY
GLUCOSE-CAPILLARY: 112 mg/dL — AB (ref 70–99)
Glucose-Capillary: 122 mg/dL — ABNORMAL HIGH (ref 70–99)
Glucose-Capillary: 124 mg/dL — ABNORMAL HIGH (ref 70–99)
Glucose-Capillary: 120 mg/dL — ABNORMAL HIGH (ref 70–99)
Glucose-Capillary: 139 mg/dL — ABNORMAL HIGH (ref 70–99)

## 2014-02-09 LAB — CBC WITH DIFFERENTIAL/PLATELET
Basophils Absolute: 0 10*3/uL (ref 0.0–0.1)
Basophils Relative: 1 % (ref 0–1)
EOS PCT: 4 % (ref 0–5)
Eosinophils Absolute: 0.2 10*3/uL (ref 0.0–0.7)
HEMATOCRIT: 47.5 % (ref 39.0–52.0)
HEMOGLOBIN: 15 g/dL (ref 13.0–17.0)
LYMPHS ABS: 1.7 10*3/uL (ref 0.7–4.0)
Lymphocytes Relative: 28 % (ref 12–46)
MCH: 31.3 pg (ref 26.0–34.0)
MCHC: 31.6 g/dL (ref 30.0–36.0)
MCV: 99.2 fL (ref 78.0–100.0)
MONOS PCT: 9 % (ref 3–12)
Monocytes Absolute: 0.6 10*3/uL (ref 0.1–1.0)
NEUTROS PCT: 59 % (ref 43–77)
Neutro Abs: 3.6 10*3/uL (ref 1.7–7.7)
Platelets: 116 10*3/uL — ABNORMAL LOW (ref 150–400)
RBC: 4.79 MIL/uL (ref 4.22–5.81)
RDW: 15 % (ref 11.5–15.5)
WBC: 6.2 10*3/uL (ref 4.0–10.5)

## 2014-02-09 LAB — LIPASE, BLOOD: Lipase: 47 U/L (ref 11–59)

## 2014-02-09 MED ORDER — GI COCKTAIL ~~LOC~~
30.0000 mL | Freq: Three times a day (TID) | ORAL | Status: DC | PRN
  Administered 2014-02-09: 30 mL via ORAL
  Filled 2014-02-09 (×2): qty 30

## 2014-02-09 NOTE — Evaluation (Signed)
Physical Therapy Evaluation Patient Details Name: [name] MRN: [id number] DOB: 22-Apr-1933 Today's Date: 02/09/2014   History of Present Illness  Patient admitted emergently after fall at home.  Patient hypoxic upon admission, with lower extremity weakness.  Patient with history Gout, unspecified; OBESITY; RESTLESS LEG SYNDROME; DETACHMENT, RETINAL NOS (1994); CATARACT NOS (2008); HYPERTENSION; CORONARY ARTERY DISEASE; PERICARDITIS, ACUTE NEC (2007); GERD; DEGENERATIVE JOINT DISEASE; CAROTID ENDARTERECTOMY, LEFT, HX OF; OBSTRUCTIVE SLEEP APNEA; TIA (transient ischemic attack) (2005); BPH (benign prostatic hypertrophy); Lumbar spinal stenosis; Carotid artery occlusion; Unspecified venous (peripheral) insufficiency; Stroke (May 2003); MYOCARDIAL INFARCTION (2004, 03/2010); Anxiety; and Depression   Clinical Impression  Patient presents with functional limitations due to deficits listed in PT problem list below. Pt with mild balance deficits that improve with use of RW and impaired cardiovascular endurance. Pt becomes SOB with exercise. Educated pt on importance of listening to body's warning signs- dizziness, light headedness- resulting in need to sit to prevent falls. Education provided on using AD for support at home. Pt would benefit from skilled PT to maximize independence and improve safe mobility so pt can safely discharge to below venue.     Follow Up Recommendations Home health PT;Supervision/Assistance - 24 hour (pt refusing any placement at this time.)    Equipment Recommendations  None recommended by PT    Recommendations for Other Services       Precautions / Restrictions Precautions Precautions: Fall Restrictions Weight Bearing Restrictions: No      Mobility  Bed Mobility Overal bed mobility: Needs Assistance Bed Mobility: Supine to Sit     Supine to sit: Supervision;HOB elevated     General bed mobility comments: Increased time.   Transfers Overall transfer  level: Needs assistance Equipment used: Rolling walker (2 wheeled);None Transfers: Sit to/from Stand Sit to Stand: Min guard Stand pivot transfers: Min assist       General transfer comment: Stood x1 from EOB without AD, stood x2 from chair without AD. Min guard for safety. no LOB.  Ambulation/Gait Ambulation/Gait assistance: Min guard Ambulation Distance (Feet): 150 Feet (+ 150' with RW and second bout without AD.) Assistive device: Rolling walker (2 wheeled);None Gait Pattern/deviations: Step-through pattern;Wide base of support Gait velocity: 1.85 ft/sec with use of RW; 2.6 ft/sec without use of AD. Gait velocity interpretation: Below normal speed for age/gender General Gait Details: Waddling like gait pattern. Gait speed improved without use of RW as pt unfamiliar with using assistive device however balance improved with use of RW during gait training. MIldly unsteady - improved with increased distance.  Stairs Stairs: Yes Stairs assistance: Min guard Stair Management: One rail Right;One rail Left;Alternating pattern Number of Stairs: 5 General stair comments: VC to decrease speed negotiating steps for safety. SOB post stair negotiation requiring standing rest break.  Wheelchair Mobility    Modified Rankin (Stroke Patients Only)       Balance Overall balance assessment: Needs assistance   Sitting balance-Leahy Scale: Good Sitting balance - Comments: Able to change gown in sitting without LOB or difficulty and donn sock sitting EOB.   Standing balance support: During functional activity Standing balance-Leahy Scale: Fair Standing balance comment: Able to mobilize without support with mild unsteadiness, balance improved with use of RW.                             Pertinent Vitals/Pain Reports pain in stomach that is "bearable." RN present in room when pt reporting pain. Pt repositioned for comfort.  SOB present during gait training with and without RW,  exacerbated post stair negotiation. Sa02 remained >93% on RA.    Home Living Family/patient expects to be discharged to:: Private residence Living Arrangements: Alone Available Help at Discharge: Family;Available PRN/intermittently (Pt reports his children live in his backyard and check on him daily./) Type of Home: House Home Access: Stairs to enter Entrance Stairs-Rails: Right Entrance Stairs-Number of Steps: 3 Home Layout: One level Home Equipment: Walker - 2 wheels;Bedside commode;Tub bench Additional Comments: Equipment belonged to wife - patient has never used it    Prior Function Level of Independence: Independent         Comments: Children help with IADLs - cooking, cleaning, etc. Pt drives. Pt works in his [employer] shop daily, uses golf cart to drive to/from shop in yard. Reports fall due to dizziness.     Hand Dominance   Dominant Hand: Right    Extremity/Trunk Assessment   Upper Extremity Assessment: Defer to OT evaluation RUE Deficits / Details: arthritis - evident joint changes at MCP  all digits, cannot contact palm with finger pads.       LUE Deficits / Details: old "nerve injury" from 2007 - surgical intervention unable to correct, lacks digit extension especially 3-5 - (?) ulnar nerve injury s/p cardio-vascular surgery   Lower Extremity Assessment: Overall WFL for tasks assessed;RLE deficits/detail;LLE deficits/detail      Cervical / Trunk Assessment:  (limited range of motion throughout spine  - arthritis )  Communication   Communication: No difficulties  Cognition Arousal/Alertness: Awake/alert Behavior During Therapy: WFL for tasks assessed/performed Overall Cognitive Status: Within Functional Limits for tasks assessed       Memory:  (difficulty naming all medications he took prior to this admission)              General Comments General comments (skin integrity, edema, etc.): Multiple lesions on B shins due to "thin skin" per patient report.  Hx of falls.    Exercises General Exercises - Lower Extremity Ankle Circles/Pumps: Both;10 reps;Seated Hip Flexion/Marching: Both;10 reps;Seated Toe Raises: Both;10 reps;Seated      Assessment/Plan    PT Assessment Patient needs continued PT services  PT Diagnosis Difficulty walking   PT Problem List Cardiopulmonary status limiting activity;Impaired sensation;Decreased activity tolerance;Decreased knowledge of use of DME;Decreased balance;Decreased safety awareness;Decreased mobility;Decreased knowledge of precautions;Decreased skin integrity  PT Treatment Interventions DME instruction;Balance training;Gait training;Stair training;Functional mobility training;Patient/family education;Therapeutic exercise;Therapeutic activities   PT Goals (Current goals can be found in the Care Plan section) Acute Rehab PT Goals Patient Stated Goal: Wants to go home. PT Goal Formulation: With patient Time For Goal Achievement: 02/23/14 Potential to Achieve Goals: Good    Frequency Min 3X/week   Barriers to discharge        Co-evaluation               End of Session Equipment Utilized During Treatment: Gait belt Activity Tolerance: Patient tolerated treatment well Patient left: in chair;with chair alarm set;with call bell/phone within reach Nurse Communication: Mobility status;Precautions         Time: 1330-1404 PT Time Calculation (min): 34 min   Charges:   PT Evaluation $Initial PT Evaluation Tier I: 1 Procedure PT Treatments $Gait Training: 8-22 mins   PT G CodesAlvie Heidelberg:          Folan, Brittiny Levitz A 02/09/2014, 2:19 PM Alvie HeidelbergShauna Folan, PT, DPT [phone]

## 2014-02-09 NOTE — Evaluation (Signed)
Occupational Therapy Evaluation Patient Details Name: [name] MRN: [id number] DOB: [birth year] Today's Date: 02/09/2014    History of Present Illness Patient admitted emergently after fall at home.  Patient hypoxic upon admission, with lower extremity weakness.  Patient with history Gout, unspecified; OBESITY; RESTLESS LEG SYNDROME; DETACHMENT, RETINAL NOS (1994); CATARACT NOS (2008); HYPERTENSION; CORONARY ARTERY DISEASE; PERICARDITIS, ACUTE NEC (2007); GERD; DEGENERATIVE JOINT DISEASE; CAROTID ENDARTERECTOMY, LEFT, HX OF; OBSTRUCTIVE SLEEP APNEA; TIA (transient ischemic attack) (2005); BPH (benign prostatic hypertrophy); Lumbar spinal stenosis; Carotid artery occlusion; Unspecified venous (peripheral) insufficiency; Stroke (May 2003); MYOCARDIAL INFARCTION (2004, 03/2010); Anxiety; and Depression   Clinical Impression   Patient presents during OT evaluation with decreased balance, decrease postural control,  decreased activity tolerance, decreased safety awareness and needs minimal assistance for BADL's and functional mobility.  Patient was independent with ADL's prior to this admission, yet received intermittent support from children who live nearby.  Patient has recent history of falls, and compromised skin on bilateral shins.  Patient will benefit from skilled OT intervention to increase safety and independence with BADL's.  Patient may benefit from CIR or short term rehab prior to discharge home.  Patient anxious about not initially returning to his home.      Follow Up Recommendations  CIR;Home health OT    Equipment Recommendations  Tub/shower bench    Recommendations for Other Services Rehab consult     Precautions / Restrictions Precautions Precautions: Fall Restrictions Weight Bearing Restrictions: No      Mobility Bed Mobility                  Transfers Overall transfer level: Needs assistance   Transfers: Sit to/from Stand;Stand Pivot Transfers Sit to  Stand: Min assist Stand pivot transfers: Min assist       General transfer comment: decreased postural control especially initially in standing - increase sway forward / backward    Balance                                            ADL Overall ADL's : Needs assistance/impaired Eating/Feeding: Set up;Sitting   Grooming: Wash/dry hands;Wash/dry face;Oral care;Applying deodorant;Brushing hair;Set up;Sitting   Upper Body Bathing: Sitting;Set up   Lower Body Bathing: Minimal assistance;Sit to/from stand   Upper Body Dressing : Minimal assistance;Sitting   Lower Body Dressing: Minimal assistance;Sit to/from stand   Toilet Transfer: Minimal assistance;Regular Toilet;Grab bars   Toileting- Clothing Manipulation and Hygiene: Minimal assistance;Sit to/from stand       Functional mobility during ADLs: Minimal assistance General ADL Comments: Patient with significant sway once transitioned from sit to stand (posterior).  Patient reliant on upper extremities for balance in standing.  Patient was walking without device prior to hospitalization     Vision                     Perception Perception Perception Tested?: No   Praxis Praxis Praxis tested?: Not tested    Pertinent Vitals/Pain Denies pain     Hand Dominance Right   Extremity/Trunk Assessment Upper Extremity Assessment Upper Extremity Assessment: Generalized weakness;RUE deficits/detail;LUE deficits/detail RUE Deficits / Details: arthritis - evident joint changes at MCP  all digits, cannot contact palm with finger pads.   LUE Deficits / Details: old "nerve injury" from 2007 - surgical intervention unable to correct, lacks digit extension especially 3-5 - (?) ulnar nerve injury  s/p cardio-vascular surgery   Lower Extremity Assessment Lower Extremity Assessment: Defer to PT evaluation   Cervical / Trunk Assessment Cervical / Trunk Assessment:  (limited range of motion throughout spine  -  arthritis )   Communication Communication Communication: No difficulties   Cognition Arousal/Alertness: Awake/alert Behavior During Therapy: WFL for tasks assessed/performed;Anxious (slightly anxious at discussion regarding discharge) Overall Cognitive Status: Within Functional Limits for tasks assessed       Memory:  (difficulty naming all medications he took prior to this admission)             General Comments       Exercises       Shoulder Instructions      Home Living Family/patient expects to be discharged to:: Private residence Living Arrangements: Alone Available Help at Discharge: Family;Available PRN/intermittently Type of Home: House Home Access: Stairs to enter Entergy CorporationEntrance Stairs-Number of Steps: 3 Entrance Stairs-Rails: Right Home Layout: One level     Bathroom Shower/Tub: Tub/shower unit Shower/tub characteristics: Curtain       Home Equipment: Environmental consultantWalker - 2 wheels;Bedside commode;Tub bench (wife's equipment - now deceased)   Additional Comments: Equipment belonged to wife - patient has never used it      Prior Functioning/Environment Level of Independence: Independent        Comments: Children helped with IADL - cooking, cleaning, etc    OT Diagnosis: Generalized weakness;Cognitive deficits;Acute pain;Ataxia   OT Problem List: Decreased strength;Impaired balance (sitting and/or standing);Pain;Decreased range of motion;Decreased safety awareness;Cardiopulmonary status limiting activity;Increased edema;Decreased activity tolerance;Decreased knowledge of use of DME or AE   OT Treatment/Interventions: Self-care/ADL training;DME and/or AE instruction;Therapeutic activities;Balance training;Therapeutic exercise;Cognitive remediation/compensation;Energy conservation;Patient/family education    OT Goals(Current goals can be found in the care plan section) Acute Rehab OT Goals Patient Stated Goal: eager to return home - wants to get stronger and steadier  on feet OT Goal Formulation: With patient Time For Goal Achievement: 02/23/14 Potential to Achieve Goals: Good  OT Frequency: Min 3X/week   Barriers to D/C: Decreased caregiver support  Children provide intermittent support; live nearby       Co-evaluation              End of Session    Activity Tolerance: Patient tolerated treatment well Patient left: in chair;with call bell/phone within reach   Time: 1010-1040 OT Time Calculation (min): 30 min Charges:  OT General Charges $OT Visit: 1 Procedure OT Evaluation $Initial OT Evaluation Tier I: 1 Procedure OT Treatments $Self Care/Home Management : 23-37 mins G-Codes:    Collier SalinaGellert, Lee-Ann Gal M 02/09/2014, 10:58 AM

## 2014-02-09 NOTE — Progress Notes (Addendum)
[facility] PROGRESS NOTE  Mason SpragueBilly [name] NFA:[id number] DOB: Jul 18, 1933 DOA: 02/07/2014 PCP: Rene PaciValerie Leschber, MD  Assessment/Plan: 1. Dizziness/weakness/hypersomnolence  -multifactorial, improved -pt reports this is due to not getting sleep for 2days due to RLS  -sedating meds minimized, CT head benign  -PT eval pending -ECHO done, report pending -monitor on tele   2. Hypoxia  -resolved -likely multifactorial due to Obesity, OSA, meds  -ABG with hypercarbia-chronic, but compensated  -weaned off O2, VQ scan low prob  3. CKD  -stable, lasix resumed at lower dose   4. Abd discomfort -suspect heartburn -continue PPI, add lfts/lipase, Gi cocktail will monitor response  5. CAD -continue plavix, metoprolol  6. DM -SSI  DVT proph: lovenox  Pt/OT, Ambulate  Code Status: Full Code Family Communication: d/w daughter at bedside Disposition Plan: home vs rehab    HPI/Subjective: Doing better, some burning pain in abd, ate all breakfast  Objective: Filed Vitals:   02/09/14 0926  BP: 132/44  Pulse: 54  Temp:   Resp:     Intake/Output Summary (Last 24 hours) at 02/09/14 1618 Last data filed at 02/09/14 0950  Gross per 24 hour  Intake    240 ml  Output   2075 ml  Net  -1835 ml   Filed Weights   02/07/14 2119 02/08/14 0234 02/09/14 0400  Weight: 106.595 kg (235 lb) 107.366 kg (236 lb 11.2 oz) 108.818 kg (239 lb 14.4 oz)    Exam:   General:  AAOx3, no distress  Cardiovascular: S1S2/RRR  Respiratory: CTAB  Abdomen:soft, Nt, BS present  Musculoskeletal: no edema c/c  Skin: bruises on lower legs  Data Reviewed: Basic Metabolic Panel:  Recent Labs Lab 02/07/14 2100 02/08/14 0510 02/09/14 0954  NA 144 147 143  K 4.1 4.3 4.3  CL 102 103 101  CO2 28 32 31  GLUCOSE 147* 129* 131*  BUN 47* 43* 40*  CREATININE 1.90* 1.76* 1.58*  CALCIUM 9.2 9.0 9.5  MG  --  2.5  --   PHOS  --  4.7*  --    Liver Function Tests:  Recent Labs Lab  02/07/14 2100 02/08/14 0510 02/09/14 0954  AST 29 25 20   ALT 20 18 15   ALKPHOS 122* 109 97  BILITOT 0.4 0.3 0.5  PROT 7.7 7.2 7.2  ALBUMIN 3.8 3.5 3.3*    Recent Labs Lab 02/09/14 0954  LIPASE 47   No results found for this basename: AMMONIA,  in the last 168 hours CBC:  Recent Labs Lab 02/07/14 2100 02/08/14 0510 02/09/14 0954  WBC 7.1 5.6 6.2  NEUTROABS 4.2  --  3.6  HGB 15.6 15.2 15.0  HCT 47.8 48.0 47.5  MCV 96.2 98.8 99.2  PLT 135* 112* 116*   Cardiac Enzymes:  Recent Labs Lab 02/07/14 2100 02/08/14 0510  TROPONINI <0.30 <0.30   BNP (last 3 results)  Recent Labs  02/07/14 2148  PROBNP 325.5   CBG:  Recent Labs Lab 02/08/14 1633 02/08/14 2108 02/09/14 0439 02/09/14 0734 02/09/14 1132  GLUCAP 137* 120* 122* 124* 120*    No results found for this or any previous visit (from the past 240 hour(s)).   Studies: Dg Chest 2 View  02/07/2014   CLINICAL DATA:  Fall.  EXAM: CHEST  2 VIEW  COMPARISON:  September 05, 2013.  FINDINGS: The heart size and mediastinal contours are within normal limits. Both lungs are clear. No pneumothorax or pleural effusion is noted. The visualized skeletal structures are unremarkable.  IMPRESSION: No acute cardiopulmonary  abnormality seen.   Electronically Signed   By: Roque Lias M.D.   On: 02/07/2014 21:42   Ct Head Wo Contrast  02/07/2014   CLINICAL DATA:  Dizziness and weakness. Recent fall. Shortness of breath and fatigue.  EXAM: CT HEAD WITHOUT CONTRAST  TECHNIQUE: Contiguous axial images were obtained from the base of the skull through the vertex without intravenous contrast.  COMPARISON:  MRI of the brain performed 11/15/2009, and CT of the head performed 09/13/2012  FINDINGS: There is no evidence of acute infarction, mass lesion, or intra- or extra-axial hemorrhage on CT.  Scattered periventricular and subcortical white matter change likely reflects small vessel ischemic microangiopathy.  The posterior fossa, including  the cerebellum, brainstem and fourth ventricle, is within normal limits. The third and lateral ventricles, and basal ganglia are unremarkable in appearance. The cerebral hemispheres are symmetric in appearance, with normal gray-white differentiation. No mass effect or midline shift is seen.  There is no evidence of fracture; visualized osseous structures are unremarkable in appearance. The orbits are within normal limits. The paranasal sinuses and mastoid air cells are well-aerated. No significant soft tissue abnormalities are seen.  IMPRESSION: 1. No evidence of traumatic intracranial injury or fracture. 2. Mild small vessel ischemic microangiopathy noted.   Electronically Signed   By: Roanna Raider M.D.   On: 02/07/2014 22:05   Nm Pulmonary Perf And Vent  02/08/2014   CLINICAL DATA:  Shortness of breath.  Hypoxia.  EXAM: NUCLEAR MEDICINE VENTILATION - PERFUSION LUNG SCAN  TECHNIQUE: Ventilation images were obtained in multiple projections using inhaled aerosol technetium 99 M DTPA. Perfusion images were obtained in multiple projections after intravenous injection of Tc-47m MAA.  RADIOPHARMACEUTICALS:  Forty mCi Tc-66m DTPA aerosol and 6 mCi Tc-26m MAA  COMPARISON:  Chest radiograph on 02/07/2014  FINDINGS: Ventilation: Small to moderate ventilation defects seen in the anterior right upper and middle lobes.  Perfusion: Matching small to moderate subsegmental perfusion defects are seen in the anterior right upper and middle lobes. No moderate or large unmatched perfusion defects are seen. No definite matching radiographic opacity seen on chest radiograph.  IMPRESSION: Low probability for pulmonary embolism.   Electronically Signed   By: Myles Rosenthal M.D.   On: 02/08/2014 12:48    Scheduled Meds: . allopurinol  300 mg Oral Daily  . aspirin EC  81 mg Oral Daily  . buPROPion  150 mg Oral q morning - 10a  . clopidogrel  75 mg Oral Daily  . docusate sodium  100 mg Oral BID  . enoxaparin (LOVENOX) injection   40 mg Subcutaneous Q24H  . furosemide  80 mg Oral Daily  . guaiFENesin  600 mg Oral BID  . insulin aspart  0-9 Units Subcutaneous 6 times per day  . ipratropium  0.5 mg Nebulization Q6H  . metoprolol  50 mg Oral BID  . mirabegron ER  50 mg Oral Daily  . mirtazapine  15 mg Oral QHS  . pantoprazole  40 mg Oral Daily  . pregabalin  75 mg Oral BID  . rOPINIRole  4 mg Oral QHS  . sodium chloride  3 mL Intravenous Q12H  . sodium chloride  3 mL Intravenous Q12H  . tamsulosin  0.4 mg Oral Daily   Continuous Infusions:  Antibiotics Given (last 72 hours)   None      Active Problems:   Type 2 diabetes mellitus with vascular disease   Restless leg syndrome   Obstructive sleep apnea, adult   CAD (coronary  artery disease)   Hypoxia   Recurrent falls   Near syncope   Chronic kidney disease (CKD), stage IV (severe)    Time spent:    [facility]  [facility] Pager [phone]. If 7PM-7AM, please contact night-coverage at [email or web address], password [facility] 02/09/2014, 4:18 PM  LOS: 2 days

## 2014-02-09 NOTE — Progress Notes (Signed)
Rehab Admissions Coordinator Note:  Patient was screened by Trish Mage for appropriateness for an Inpatient Acute Rehab Consult.  Noted OT recommending CIR.  At this time, we are recommending Inpatient Rehab consult.  Lelon Frohlich M 02/09/2014, 12:00 PM  I can be reached at [phone].

## 2014-02-10 ENCOUNTER — Encounter (HOSPITAL_COMMUNITY): Payer: Self-pay | Admitting: General Practice

## 2014-02-10 DIAGNOSIS — G4733 Obstructive sleep apnea (adult) (pediatric): Secondary | ICD-10-CM

## 2014-02-10 DIAGNOSIS — G2581 Restless legs syndrome: Secondary | ICD-10-CM

## 2014-02-10 LAB — GLUCOSE, CAPILLARY
GLUCOSE-CAPILLARY: 93 mg/dL (ref 70–99)
Glucose-Capillary: 104 mg/dL — ABNORMAL HIGH (ref 70–99)

## 2014-02-10 MED ORDER — PANTOPRAZOLE SODIUM 40 MG PO TBEC: 40.0000 mg | DELAYED_RELEASE_TABLET | Freq: Two times a day (BID) | ORAL | Status: DC

## 2014-02-10 NOTE — Progress Notes (Signed)
Thank you for consult on Mr. Mynatt. He does not have medical diagnosis to justify CIR. He is min guard assist and limited due to SOB. PT recommending HH therapy with supervision after discharge. Patient examined and chart reviewed--he prefers to go home and has declined SNF. Will defer CIR consult for now.   Agree with above.  Ranelle Oyster, MD, [facility] Physical Medicine & Rehabilitation

## 2014-02-10 NOTE — Progress Notes (Signed)
Physical Therapy Treatment Patient Details Name: Mason SpragueBilly Ouzts MRN: [id number] DOB: 02-04-1933 Today's Date: 02/10/2014    History of Present Illness Patient admitted emergently after fall at home.  Patient hypoxic upon admission, with lower extremity weakness.  Patient with history Gout, unspecified; OBESITY; RESTLESS LEG SYNDROME; DETACHMENT, RETINAL NOS (1994); CATARACT NOS (2008); HYPERTENSION; CORONARY ARTERY DISEASE; PERICARDITIS, ACUTE NEC (2007); GERD; DEGENERATIVE JOINT DISEASE; CAROTID ENDARTERECTOMY, LEFT, HX OF; OBSTRUCTIVE SLEEP APNEA; TIA (transient ischemic attack) (2005); BPH (benign prostatic hypertrophy); Lumbar spinal stenosis; Carotid artery occlusion; Unspecified venous (peripheral) insufficiency; Stroke (May 2003); MYOCARDIAL INFARCTION (2004, 03/2010); Anxiety; and Depression    PT Comments    Patient's balance and mobility improving. Pt eager to return home today. Able to safely ambulate without use of RW for support and able to safely increase ambulation distance today, decreasing fall risk. Pt continues to fatigue and requires frequent rest breaks due to SOB. Will continue to follow as appropriate.   Follow Up Recommendations  Home health PT;Supervision for mobility/OOB     Equipment Recommendations  None recommended by PT    Recommendations for Other Services       Precautions / Restrictions Precautions Precautions: Fall Restrictions Weight Bearing Restrictions: No    Mobility  Bed Mobility Overal bed mobility: Modified Independent Bed Mobility: Supine to Sit     Supine to sit: Modified independent (Device/Increase time)     General bed mobility comments: Increased time, HOB flat, no hand rails.  Transfers Overall transfer level: Needs assistance Equipment used: Rolling walker (2 wheeled);None Transfers: Sit to/from Stand Sit to Stand: Supervision         General transfer comment: Stood x1 from EOB, stood x3 from chair with and without AD.  Supervision for safety.  Ambulation/Gait Ambulation/Gait assistance: Supervision Ambulation Distance (Feet): 150 Feet (x3 bouts with 2 seated rest breaks between ambulation bouts.) Assistive device: Rolling walker (2 wheeled);None Gait Pattern/deviations: Step-through pattern;Wide base of support Gait velocity: 2.63 ft/sec Gait velocity interpretation: >2.62 ft/sec, indicative of independent community ambulator General Gait Details: Ambulated first bout with use of RW and second 2 bouts without AD. Gait speed improved without use of RW. SOB present post gait.   Stairs Stairs: Yes Stairs assistance: Min guard Stair Management: One rail Right;One rail Left;Alternating pattern Number of Stairs: 4 General stair comments: More steady today with stair negotiation. Min guard for safety.  Wheelchair Mobility    Modified Rankin (Stroke Patients Only)       Balance Overall balance assessment: Modified Independent   Sitting balance-Leahy Scale: Normal       Standing balance-Leahy Scale: Fair                      Cognition Arousal/Alertness: Awake/alert Behavior During Therapy: WFL for tasks assessed/performed Overall Cognitive Status: Within Functional Limits for tasks assessed                      Exercises      General Comments General comments (skin integrity, edema, etc.): Pt very eager to return home today.      Pertinent Vitals/Pain Sa02 remained >93% during exercise and gait on RA. SOB present. No pain reported.     Home Living Family/patient expects to be discharged to:: Private residence Living Arrangements: Alone                  Prior Function            PT Goals (current goals  can now be found in the care plan section) Progress towards PT goals: Progressing toward goals    Frequency  Min 3X/week    PT Plan Current plan remains appropriate    Co-evaluation             End of Session Equipment Utilized During  Treatment: Gait belt Activity Tolerance: Patient tolerated treatment well Patient left: in chair;with call bell/phone within reach     Time: 8115-7262 PT Time Calculation (min): 17 min  Charges:  $Therapeutic Activity: 8-22 mins                    G CodesAlvie Heidelberg A 2014/02/28, 12:08 PM Alvie Heidelberg, PT, DPT [phone]

## 2014-02-10 NOTE — Discharge Summary (Signed)
Physician Discharge Summary  [name] XBJ:[id number] DOB: October 25, 1932 DOA: 02/07/2014  PCP: [name], MD  Admit date: 02/07/2014 Discharge date: 02/10/2014  Time spent: 45 minutes  Recommendations for Outpatient Follow-up:  1. PCP in 1 week  Discharge Diagnoses:    Fall   Hypoxia   Weakness/lethargy   Type 2 diabetes mellitus with vascular disease   Restless leg syndrome   Obstructive sleep apnea, adult   CAD (coronary artery disease)   Hypoxia   Recurrent falls   Near syncope   Chronic kidney disease (CKD), stage IV (severe)   Discharge Condition: stable  Diet recommendation: heart healthy  Filed Weights   02/08/14 0234 02/09/14 0400 02/10/14 0400  Weight: 107.366 kg (236 lb 11.2 oz) 108.818 kg (239 lb 14.4 oz) 108.138 kg (238 lb 6.4 oz)    History of present illness:  [name] is a 80 y.o. male with PMH of Gout, Obesity, CAD, HTN, OSA, RLS, BPH, Spinal stenosis, Anxiety; and Depression.  Presented with weakness, lethargy and hypoxia. Per notes he had some frequent falls and abrasions to his shins. He was brought to Er and was found to be mildly hypoxic. Patient states he has OSA and has CPAP at home although does not always wear it. Patient stated he lives at home. He had not slept well for the past few days and was very sleepy. ABG was done showing compensated hypercarbic acidosis. Patient initially was seen at [facility] and was then transferred to [facility].   Hospital Course:  1. Dizziness/weakness/hypersomnolence  -multifactorial, improved  -Pt reported not getting sleep for 2days due to RLS  -CT head benign, symptoms improved -Uses CPAP daily but not the whole night, advised to use this throughout night since hypercarbia could be contributing to daytime somnolence especially if didn't get restful sleep. -PT eval completed, Home health Pt set up -ECHO done, EF of 55-60%, mild AS -No events on tele  2. Hypoxia  -overnight resolved  -likely multifactorial  due to Obesity, OSA, meds  -ABG with hypercarbia-chronic, but compensated  -weaned off O2, VQ scan low prob of PE, CXR clear  3. CKD  -stable, lasix resumed   4. GERD -did have symptoms of heartburn  -resolved with Gi cocktail, PPI dose incraesed  5. CAD  -stable -continued on  plavix, metoprolol   6. DM  -SSI  7. RLS: -on lyrica and requip -stopped requip since this is reportedly not helping him at all   Discharge Exam: Filed Vitals:   02/10/14 1040  BP: 154/53  Pulse: 56  Temp:   Resp:     General: AAOx3, no distress Cardiovascular: S1S2/RRR Respiratory: CTAB  Discharge Instructions You were cared for by a hospitalist during your hospital stay. If you have any questions about your discharge medications or the care you received while you were in the hospital after you are discharged, you can call the unit and asked to speak with the hospitalist on call if the hospitalist that took care of you is not available. Once you are discharged, your primary care physician will handle any further medical issues. Please note that NO REFILLS for any discharge medications will be authorized once you are discharged, as it is imperative that you return to your primary care physician (or establish a relationship with a primary care physician if you do not have one) for your aftercare needs so that they can reassess your need for medications and monitor your lab values.  Discharge Instructions   Diet - low sodium  heart healthy    Complete by:  As directed      Increase activity slowly    Complete by:  As directed             Medication List    STOP taking these medications       rOPINIRole 4 MG tablet  Commonly known as:  REQUIP      TAKE these medications       allopurinol 300 MG tablet  Commonly known as:  ZYLOPRIM  Take 1 tablet (300 mg total) by mouth daily.     aspirin EC 81 MG tablet  Take 81 mg by mouth daily.     B-12 1000 MCG Caps  Take 1,000 mg by mouth  daily.     buPROPion 150 MG 24 hr tablet  Commonly known as:  WELLBUTRIN XL  Take 1 tablet (150 mg total) by mouth every morning.     clopidogrel 75 MG tablet  Commonly known as:  PLAVIX  Take 1 tablet (75 mg total) by mouth daily.     D-5000 MAXIMUM STRENGTH 5000 UNITS capsule  Generic drug:  Cholecalciferol  Take 5,000 Units by mouth daily.     Fish Oil 1200 MG Caps  Take 1,200 mg by mouth daily.     furosemide 40 MG tablet  Commonly known as:  LASIX  Take 2 tablets (80 mg total) by mouth 2 (two) times daily.     LORazepam 1 MG tablet  Commonly known as:  ATIVAN  Take 1 tablet (1 mg total) by mouth every morning.     Magnesium 250 MG Tabs  Take 250 mg by mouth daily.     metoprolol 50 MG tablet  Commonly known as:  LOPRESSOR  Take 1 tablet (50 mg total) by mouth 2 (two) times daily.     mirtazapine 15 MG tablet  Commonly known as:  REMERON  Take 1 tablet (15 mg total) by mouth at bedtime.     MYRBETRIQ 50 MG Tb24 tablet  Generic drug:  mirabegron ER  Take 50 mg by mouth daily.     nitroGLYCERIN 0.4 MG SL tablet  Commonly known as:  NITROSTAT  Place 0.4 mg under the tongue every 5 (five) minutes as needed. For chest pain     oxyCODONE-acetaminophen 10-325 MG per tablet  Commonly known as:  PERCOCET  Take 1 tablet by mouth every 8 (eight) hours as needed for pain.     pantoprazole 40 MG tablet  Commonly known as:  PROTONIX  Take 1 tablet (40 mg total) by mouth 2 (two) times daily.     potassium chloride SA 20 MEQ tablet  Commonly known as:  K-DUR,KLOR-CON  Take 1 tablet (20 mEq total) by mouth 2 (two) times daily.     pregabalin 200 MG capsule  Commonly known as:  LYRICA  Take 1 capsule (200 mg total) by mouth 2 (two) times daily. Take 1 capsule (200mg ) by mouth twice a day     tamsulosin 0.4 MG Caps capsule  Commonly known as:  FLOMAX  Take 1 capsule (0.4 mg total) by mouth daily.       Allergies  Allergen Reactions  . Atorvastatin     REACTION:  tol simvastatin ok per pt ( Pt. Says ALL the Statins ) Severe muscle weakness       Follow-up Information   Follow up with Rene PaciValerie Leschber, MD. Schedule an appointment as soon as possible for a visit in 1 week.  Specialty:  Internal Medicine   Contact information:   [address] [place] [address] [phone]       Follow up with [facility]. (Physical and Occupational Therapy and Registered Nurse for medication and disease management and wound care. )    Contact information:   [address] [place] [address] [phone]        The results of significant diagnostics from this hospitalization (including imaging, microbiology, ancillary and laboratory) are listed below for reference.    Significant Diagnostic Studies: Dg Chest 2 View  02/07/2014   CLINICAL DATA:  Fall.  EXAM: CHEST  2 VIEW  COMPARISON:  September 05, 2013.  FINDINGS: The heart size and mediastinal contours are within normal limits. Both lungs are clear. No pneumothorax or pleural effusion is noted. The visualized skeletal structures are unremarkable.  IMPRESSION: No acute cardiopulmonary abnormality seen.   Electronically Signed   By: Roque Lias M.D.   On: 02/07/2014 21:42   Ct Head Wo Contrast  02/07/2014   CLINICAL DATA:  Dizziness and weakness. Recent fall. Shortness of breath and fatigue.  EXAM: CT HEAD WITHOUT CONTRAST  TECHNIQUE: Contiguous axial images were obtained from the base of the skull through the vertex without intravenous contrast.  COMPARISON:  MRI of the brain performed 11/15/2009, and CT of the head performed 09/13/2012  FINDINGS: There is no evidence of acute infarction, mass lesion, or intra- or extra-axial hemorrhage on CT.  Scattered periventricular and subcortical white matter change likely reflects small vessel ischemic microangiopathy.  The posterior fossa, including the cerebellum, brainstem and fourth ventricle, is within normal  limits. The third and lateral ventricles, and basal ganglia are unremarkable in appearance. The cerebral hemispheres are symmetric in appearance, with normal gray-white differentiation. No mass effect or midline shift is seen.  There is no evidence of fracture; visualized osseous structures are unremarkable in appearance. The orbits are within normal limits. The paranasal sinuses and mastoid air cells are well-aerated. No significant soft tissue abnormalities are seen.  IMPRESSION: 1. No evidence of traumatic intracranial injury or fracture. 2. Mild small vessel ischemic microangiopathy noted.   Electronically Signed   By: Roanna Raider M.D.   On: 02/07/2014 22:05   Nm Pulmonary Perf And Vent  02/08/2014   CLINICAL DATA:  Shortness of breath.  Hypoxia.  EXAM: NUCLEAR MEDICINE VENTILATION - PERFUSION LUNG SCAN  TECHNIQUE: Ventilation images were obtained in multiple projections using inhaled aerosol technetium 99 M DTPA. Perfusion images were obtained in multiple projections after intravenous injection of Tc-19m MAA.  RADIOPHARMACEUTICALS:  Forty mCi Tc-35m DTPA aerosol and 6 mCi Tc-62m MAA  COMPARISON:  Chest radiograph on 02/07/2014  FINDINGS: Ventilation: Small to moderate ventilation defects seen in the anterior right upper and middle lobes.  Perfusion: Matching small to moderate subsegmental perfusion defects are seen in the anterior right upper and middle lobes. No moderate or large unmatched perfusion defects are seen. No definite matching radiographic opacity seen on chest radiograph.  IMPRESSION: Low probability for pulmonary embolism.   Electronically Signed   By: Myles Rosenthal M.D.   On: 02/08/2014 12:48    Microbiology: No results found for this or any previous visit (from the past 240 hour(s)).   Labs: Basic Metabolic Panel:  Recent Labs Lab 02/07/14 2100 02/08/14 0510 02/09/14 0954  NA 144 147 143  K 4.1 4.3 4.3  CL 102 103 101  CO2 28 32 31  GLUCOSE 147*  129* 131*  BUN 47* 43* 40*   CREATININE 1.90* 1.76* 1.58*  CALCIUM 9.2 9.0 9.5  MG  --  2.5  --   PHOS  --  4.7*  --    Liver Function Tests:  Recent Labs Lab 02/07/14 2100 02/08/14 0510 02/09/14 0954  AST 29 25 20   ALT 20 18 15   ALKPHOS 122* 109 97  BILITOT 0.4 0.3 0.5  PROT 7.7 7.2 7.2  ALBUMIN 3.8 3.5 3.3*    Recent Labs Lab 02/09/14 0954  LIPASE 47   No results found for this basename: AMMONIA,  in the last 168 hours CBC:  Recent Labs Lab 02/07/14 2100 02/08/14 0510 02/09/14 0954  WBC 7.1 5.6 6.2  NEUTROABS 4.2  --  3.6  HGB 15.6 15.2 15.0  HCT 47.8 48.0 47.5  MCV 96.2 98.8 99.2  PLT 135* 112* 116*   Cardiac Enzymes:  Recent Labs Lab 02/07/14 2100 02/08/14 0510  TROPONINI <0.30 <0.30   BNP: BNP (last 3 results)  Recent Labs  02/07/14 2148  PROBNP 325.5   CBG:  Recent Labs Lab 02/09/14 1132 02/09/14 1704 02/09/14 2018 02/10/14 0736 02/10/14 1133  GLUCAP 120* 112* 139* 93 104*       Signed:  Aikeem Lilley  [facility] 02/10/2014, 2:21 PM

## 2014-02-10 NOTE — Care Management Note (Signed)
    Page 1 of 1   02/10/2014     10:48:03 AM CARE MANAGEMENT NOTE 02/10/2014  Patient:  [name], [name]   Account Number:  [id number]  Date Initiated:  02/10/2014  Documentation initiated by:  GRAVES-BIGELOW,Jodiann Ognibene  Subjective/Objective Assessment:   Pt admitted for frequent falls. Pt is from home alone. Pt is refusing SNF- states his daughter can help provide care.     Action/Plan:   CM did call daughter and she lives next door and looks in on. Pt/ Daughter agreeable to [facility] services- RN, PT, OT with [facility]: Lambert Rhonda W. CM did make referral for services and SOC to begin within 24-48 hrs post d/c.   Anticipated DC Date:     Anticipated DC Plan:  HOME W HOME HEALTH SERVICES      DC Planning Services  CM consult      [facility]  HOME HEALTH   Choice offered to / List presented to:  C-1 Patient        HH arranged  HH-1 RN  HH-10 DISEASE MANAGEMENT  HH-2 PT  HH-3 OT      [facility] agency  [facility]   Status of service:  Completed, signed off Medicare Important Message given?  YES (If response is "NO", the following Medicare IM given date fields will be blank) Date Medicare IM given:  02/10/2014 Medicare IM given by:  GRAVES-BIGELOW,Oris Staffieri Date Additional Medicare IM given:   Additional Medicare IM given by:    Discharge Disposition:  HOME W HOME HEALTH SERVICES  Per UR Regulation:  Reviewed for med. necessity/level of care/duration of stay  If discussed at Long Length of Stay Meetings, dates discussed:    Comments:

## 2014-02-11 ENCOUNTER — Telehealth: Payer: Self-pay | Admitting: Internal Medicine

## 2014-02-11 NOTE — Telephone Encounter (Signed)
Patient just got out of hospital ([place]) and has a new diagnosis of diabeties with A1C 6.2 obtained on 6/29.   Needs 1 week follow up.  Please advise if we can get him in with another physician.  [facility]J is requesting we call patient to see up hospital follow up.

## 2014-02-17 ENCOUNTER — Other Ambulatory Visit: Payer: Self-pay

## 2014-02-17 NOTE — Telephone Encounter (Signed)
Rx clarification for Lorazepam. Pharmacy states that dose and strength is missing.   Correction sent to pharmacy.

## 2014-02-19 ENCOUNTER — Ambulatory Visit (INDEPENDENT_AMBULATORY_CARE_PROVIDER_SITE_OTHER): Payer: Medicare Other | Admitting: Internal Medicine

## 2014-02-19 ENCOUNTER — Telehealth: Payer: Self-pay | Admitting: *Deleted

## 2014-02-19 ENCOUNTER — Encounter: Payer: Self-pay | Admitting: Internal Medicine

## 2014-02-19 VITALS — BP 130/76 | HR 62 | Temp 98.7°F | Resp 16 | Ht 69.0 in | Wt 241.1 lb

## 2014-02-19 DIAGNOSIS — I872 Venous insufficiency (chronic) (peripheral): Secondary | ICD-10-CM

## 2014-02-19 DIAGNOSIS — I798 Other disorders of arteries, arterioles and capillaries in diseases classified elsewhere: Secondary | ICD-10-CM

## 2014-02-19 DIAGNOSIS — E785 Hyperlipidemia, unspecified: Secondary | ICD-10-CM

## 2014-02-19 DIAGNOSIS — E1159 Type 2 diabetes mellitus with other circulatory complications: Secondary | ICD-10-CM

## 2014-02-19 DIAGNOSIS — G2581 Restless legs syndrome: Secondary | ICD-10-CM

## 2014-02-19 DIAGNOSIS — G4733 Obstructive sleep apnea (adult) (pediatric): Secondary | ICD-10-CM

## 2014-02-19 DIAGNOSIS — I2510 Atherosclerotic heart disease of native coronary artery without angina pectoris: Secondary | ICD-10-CM

## 2014-02-19 DIAGNOSIS — I2584 Coronary atherosclerosis due to calcified coronary lesion: Secondary | ICD-10-CM

## 2014-02-19 NOTE — Telephone Encounter (Signed)
Ok to cancel HHPT

## 2014-02-19 NOTE — Telephone Encounter (Signed)
Dip gauze in  sterile saline and applied to the wound twice a day. Cover the wound with Telfa , non stick dressing  without any antibiotic ointment. The saline can be purchased at the drugstore or you can make your own .Boil cup of salt in a gallon of water. Store mixture  in a clean container.Report Warning  signs as discussed (red streaks, pus, fever, increasing pain). 

## 2014-02-19 NOTE — Assessment & Plan Note (Signed)
Chronic symptoms - improved with Lyrica, will titrate up 12/2013 also used for gout and other neuropathic pain Continue Requip and iron as well (pt now reports +benefit with Requip - poor sleep without requp since hosp DC - ok to resume) Refill on oxycodone (percocet) as above - uses same when severe symptoms

## 2014-02-19 NOTE — Assessment & Plan Note (Signed)
Intol of atorva and all statins sue to myalgias - therfore no statin therapy Recheck lipids annually - last reviewed  The patient is asked to make an attempt to improve diet and exercise patterns to aid in medical management of this problem.

## 2014-02-19 NOTE — Progress Notes (Signed)
Pre visit review using our clinic review tool, if applicable. No additional management support is needed unless otherwise documented below in the visit note. 

## 2014-02-19 NOTE — Assessment & Plan Note (Signed)
Bilateral lower legs with chronic venous insufficiency and stasis dermatitis.  intermittent pen wounds related to same October 2014 dermatology evaluation [facility]) for same reviewed: Biopsy unable to exclude vasculitis -also prev eval by vascular Dr Arbie Cookey for same.  Reminded patient to comply with compression stockings as prescribed and keep open sores covered as needed to prevent sticking.  no acute infection -no need for antibiotics

## 2014-02-19 NOTE — Assessment & Plan Note (Signed)
Reviewed variable compliance with CPAP at home prior to admission 01/2014 Reports using nightly since DC home Other factors influencing sleep including chronic pain, RLS and mood

## 2014-02-19 NOTE — Telephone Encounter (Signed)
Left msg on triage stating wanting to let md know he has reach out to pt on several occassions pt has never return call. Was inform by the nurses that the pt doesn't want PT service...Raechel Chute

## 2014-02-19 NOTE — Telephone Encounter (Signed)
Called HHRN back informed of MD's instructions on wound care. HHRN did verbally agree to instruct the patient on wound care.

## 2014-02-19 NOTE — Telephone Encounter (Signed)
Left msg on triage stating pt saw md this morning. Requesting wound care order for his legs...Raechel Chute

## 2014-02-19 NOTE — Patient Instructions (Signed)
It was good to see you today.  We have reviewed your prior records including labs and tests today  Medications reviewed and updated, no changes recommended at this time.  Please keep scheduled followup as planned, call sooner if problems.

## 2014-02-19 NOTE — Progress Notes (Signed)
Subjective:    Patient ID: [name], male    DOB: Dec 16, 1932, [age] y.o.   MRN: [id number]  HPI  Patient here for hospital follow up: Admit date: 02/07/2014  Discharge date: 02/10/2014  Time spent: 45 minutes  Recommendations for Outpatient Follow-up:  1. PCP in 1 week Discharge Diagnoses:  Fall  Hypoxia  Weakness/lethargy  Type 2 diabetes mellitus with vascular disease  Restless leg syndrome  Obstructive sleep apnea, adult  CAD (coronary artery disease)  Hypoxia  Recurrent falls  Near syncope  Chronic kidney disease (CKD), stage IV (severe)    Also reviewed chronic medical issues and interval medical events  Past Medical History  Diagnosis Date  . Gout, unspecified     severe dz, "treatment done that last 15 years"  . OBESITY   . RESTLESS LEG SYNDROME   . DETACHMENT, RETINAL NOS 1994  . CATARACT NOS 2008  . HYPERTENSION   . CORONARY ARTERY DISEASE   . PERICARDITIS, ACUTE NEC 2007    MSSA, s/p pericardial window  . GERD   . DEGENERATIVE JOINT DISEASE   . CAROTID ENDARTERECTOMY, LEFT, HX OF   . OBSTRUCTIVE SLEEP APNEA     CPAP qhs  . TIA (transient ischemic attack) 2005  . BPH (benign prostatic hypertrophy)     "minor"  . Lumbar spinal stenosis   . Carotid artery occlusion   . Unspecified venous (peripheral) insufficiency   . Stroke May 2003  . MYOCARDIAL INFARCTION 2004, 03/2010    "minor"  . Anxiety   . Depression   . Shortness of breath     Review of Systems  Constitutional: Negative for fever, fatigue and unexpected weight change.  Respiratory: Negative for cough and shortness of breath.   Cardiovascular: Negative for chest pain, palpitations and leg swelling.  Neurological: Negative for weakness.  Psychiatric/Behavioral: Negative for confusion.       Objective:   Physical Exam  BP 130/76  Pulse 62  Temp(Src) 98.7 F (37.1 C) (Oral)  Resp 16  Ht 5\' 9"  (1.753 m)  Wt 241 lb 1.9 oz (109.371 kg)  BMI 35.59 kg/m2  SpO2 97% Wt Readings  from Last 3 Encounters:  02/19/14 241 lb 1.9 oz (109.371 kg)  02/10/14 238 lb 6.4 oz (108.138 kg)  01/19/14 239 lb 12.8 oz (108.773 kg)   Constitutional: he is obese, appears well-developed and well-nourished. No distress.  Neck: Normal range of motion. Neck supple. No JVD present. No thyromegaly present.  Cardiovascular: Normal rate, regular rhythm and normal heart sounds.  No murmur heard. trace BLE edema. Pulmonary/Chest: Effort normal and breath sounds normal. No respiratory distress. he has no wheezes.  Skin: B shins with superficial small ulcerations (1 on left, 2 on right)  from venous insuff Psychiatric: he has a normal mood and affect. His behavior is normal. Judgment and thought content normal.   Lab Results  Component Value Date   WBC 6.2 02/09/2014   HGB 15.0 02/09/2014   HCT 47.5 02/09/2014   PLT 116* 02/09/2014   GLUCOSE 131* 02/09/2014   CHOL 215* 01/19/2014   TRIG 517.0 Triglyceride is over 400; calculations on Lipids are invalid.* 01/19/2014   HDL 27.90* 01/19/2014   LDLDIRECT 134.4 02/06/2013   LDLCALC  Value: 115        Total Cholesterol/HDL:CHD Risk Coronary Heart Disease Risk Table                     Men   Women  1/2 Average  Risk   3.4   3.3  Average Risk       5.0   4.4  2 X Average Risk   9.6   7.1  3 X Average Risk  23.4   11.0        Use the calculated Patient Ratio above and the CHD Risk Table to determine the patient's CHD Risk.        ATP III CLASSIFICATION (LDL):  <100     mg/dL   Optimal  409-811  mg/dL   Near or Above                    Optimal  130-159  mg/dL   Borderline  914-782  mg/dL   High  >956     mg/dL   Very High* 09/06/863   ALT 15 02/09/2014   AST 20 02/09/2014   NA 143 02/09/2014   K 4.3 02/09/2014   CL 101 02/09/2014   CREATININE 1.58* 02/09/2014   BUN 40* 02/09/2014   CO2 31 02/09/2014   TSH 1.540 02/08/2014   INR 0.96 04/15/2010   HGBA1C 6.1* 02/08/2014   MICROALBUR 0.9 01/19/2014    Dg Chest 2 View  02/07/2014   CLINICAL DATA:  Fall.  EXAM: CHEST  2  VIEW  COMPARISON:  September 05, 2013.  FINDINGS: The heart size and mediastinal contours are within normal limits. Both lungs are clear. No pneumothorax or pleural effusion is noted. The visualized skeletal structures are unremarkable.  IMPRESSION: No acute cardiopulmonary abnormality seen.   Electronically Signed   By: Roque Lias M.D.   On: 02/07/2014 21:42   Ct Head Wo Contrast  02/07/2014   CLINICAL DATA:  Dizziness and weakness. Recent fall. Shortness of breath and fatigue.  EXAM: CT HEAD WITHOUT CONTRAST  TECHNIQUE: Contiguous axial images were obtained from the base of the skull through the vertex without intravenous contrast.  COMPARISON:  MRI of the brain performed 11/15/2009, and CT of the head performed 09/13/2012  FINDINGS: There is no evidence of acute infarction, mass lesion, or intra- or extra-axial hemorrhage on CT.  Scattered periventricular and subcortical white matter change likely reflects small vessel ischemic microangiopathy.  The posterior fossa, including the cerebellum, brainstem and fourth ventricle, is within normal limits. The third and lateral ventricles, and basal ganglia are unremarkable in appearance. The cerebral hemispheres are symmetric in appearance, with normal gray-white differentiation. No mass effect or midline shift is seen.  There is no evidence of fracture; visualized osseous structures are unremarkable in appearance. The orbits are within normal limits. The paranasal sinuses and mastoid air cells are well-aerated. No significant soft tissue abnormalities are seen.  IMPRESSION: 1. No evidence of traumatic intracranial injury or fracture. 2. Mild small vessel ischemic microangiopathy noted.   Electronically Signed   By: Roanna Raider M.D.   On: 02/07/2014 22:05   Nm Pulmonary Perf And Vent  02/08/2014   CLINICAL DATA:  Shortness of breath.  Hypoxia.  EXAM: NUCLEAR MEDICINE VENTILATION - PERFUSION LUNG SCAN  TECHNIQUE: Ventilation images were obtained in multiple  projections using inhaled aerosol technetium 99 M DTPA. Perfusion images were obtained in multiple projections after intravenous injection of Tc-53m MAA.  RADIOPHARMACEUTICALS:  Forty mCi Tc-32m DTPA aerosol and 6 mCi Tc-17m MAA  COMPARISON:  Chest radiograph on 02/07/2014  FINDINGS: Ventilation: Small to moderate ventilation defects seen in the anterior right upper and middle lobes.  Perfusion: Matching small to moderate subsegmental perfusion defects are seen in  the anterior right upper and middle lobes. No moderate or large unmatched perfusion defects are seen. No definite matching radiographic opacity seen on chest radiograph.  IMPRESSION: Low probability for pulmonary embolism.   Electronically Signed   By: Myles Rosenthal M.D.   On: 02/08/2014 12:48       Assessment & Plan:   Problem List Items Addressed This Visit   Chronic venous insufficiency (Chronic)     Bilateral lower legs with chronic venous insufficiency and stasis dermatitis.  intermittent pen wounds related to same October 2014 dermatology evaluation [facility]) for same reviewed: Biopsy unable to exclude vasculitis -also prev eval by vascular Dr Arbie Cookey for same.  Reminded patient to comply with compression stockings as prescribed and keep open sores covered as needed to prevent sticking.  no acute infection -no need for antibiotics     Hypelipidemia (Chronic)     Intol of atorva and all statins sue to myalgias - therfore no statin therapy Recheck lipids annually - last reviewed  The patient is asked to make an attempt to improve diet and exercise patterns to aid in medical management of this problem.     Obstructive sleep apnea, adult (Chronic)     Reviewed variable compliance with CPAP at home prior to admission 01/2014 Reports using nightly since DC home Other factors influencing sleep including chronic pain, RLS and mood    Restless leg syndrome - Primary (Chronic)     Chronic symptoms - improved with Lyrica, will titrate up  12/2013 also used for gout and other neuropathic pain Continue Requip and iron as well (pt now reports +benefit with Requip - poor sleep without requp since hosp DC - ok to resume) Refill on oxycodone (percocet) as above - uses same when severe symptoms     Type 2 diabetes mellitus with vascular disease (Chronic)      Steroid induced in past - no steroids since 2011 Remains "diet controlled" at present Recheck a1c q6-12 mo and consider metformin or other med if a1c>7 The patient is asked to make an attempt to improve diet and exercise patterns to aid in medical management of this problem.  Lab Results  Component Value Date   HGBA1C 6.1* 02/08/2014        Time spent with pt today 25 minutes, greater than 50% time spent counseling patient on hosp for AMS, hypercarbic resp distress, DM dx and medication review. Also review of prior records

## 2014-02-19 NOTE — Assessment & Plan Note (Signed)
Steroid induced in past - no steroids since 2011 Remains "diet controlled" at present Recheck a1c q6-12 mo and consider metformin or other med if a1c>7 The patient is asked to make an attempt to improve diet and exercise patterns to aid in medical management of this problem.  Lab Results  Component Value Date   HGBA1C 6.1* 02/08/2014

## 2014-02-19 NOTE — Telephone Encounter (Signed)
[name] has been notified...[name]

## 2014-02-25 DIAGNOSIS — I2510 Atherosclerotic heart disease of native coronary artery without angina pectoris: Secondary | ICD-10-CM

## 2014-02-25 DIAGNOSIS — E119 Type 2 diabetes mellitus without complications: Secondary | ICD-10-CM

## 2014-02-25 DIAGNOSIS — I129 Hypertensive chronic kidney disease with stage 1 through stage 4 chronic kidney disease, or unspecified chronic kidney disease: Secondary | ICD-10-CM

## 2014-03-03 ENCOUNTER — Other Ambulatory Visit: Payer: Self-pay

## 2014-03-03 MED ORDER — PREGABALIN 200 MG PO CAPS
200.0000 mg | ORAL_CAPSULE | Freq: Two times a day (BID) | ORAL | Status: DC
Start: 2014-03-03 — End: 2014-04-22

## 2014-03-03 NOTE — Telephone Encounter (Signed)
Phone in rx refill for Lyrica to [facility].   [facility] did not have rx filled anymore.   Pt notified.

## 2014-03-03 NOTE — Telephone Encounter (Signed)
Pt called in about rx for Lyrica. Pa was denied. Med list shows a new rx for 200mg  bid instead of qid was put in.

## 2014-03-20 ENCOUNTER — Inpatient Hospital Stay (HOSPITAL_COMMUNITY)
Admission: EM | Admit: 2014-03-20 | Discharge: 2014-03-21 | DRG: 690 | Disposition: A | Discharge status: Home Health | Payer: Medicare Other | Attending: Internal Medicine | Admitting: Internal Medicine

## 2014-03-20 ENCOUNTER — Emergency Department (HOSPITAL_COMMUNITY): Payer: Medicare Other

## 2014-03-20 ENCOUNTER — Encounter (HOSPITAL_COMMUNITY): Payer: Self-pay | Admitting: Emergency Medicine

## 2014-03-20 DIAGNOSIS — I252 Old myocardial infarction: Secondary | ICD-10-CM | POA: Diagnosis not present

## 2014-03-20 DIAGNOSIS — N390 Urinary tract infection, site not specified: Secondary | ICD-10-CM | POA: Diagnosis not present

## 2014-03-20 DIAGNOSIS — L97909 Non-pressure chronic ulcer of unspecified part of unspecified lower leg with unspecified severity: Secondary | ICD-10-CM

## 2014-03-20 DIAGNOSIS — K5900 Constipation, unspecified: Secondary | ICD-10-CM | POA: Diagnosis present

## 2014-03-20 DIAGNOSIS — I129 Hypertensive chronic kidney disease with stage 1 through stage 4 chronic kidney disease, or unspecified chronic kidney disease: Secondary | ICD-10-CM | POA: Diagnosis present

## 2014-03-20 DIAGNOSIS — F3289 Other specified depressive episodes: Secondary | ICD-10-CM | POA: Diagnosis present

## 2014-03-20 DIAGNOSIS — E785 Hyperlipidemia, unspecified: Secondary | ICD-10-CM | POA: Diagnosis present

## 2014-03-20 DIAGNOSIS — I2584 Coronary atherosclerosis due to calcified coronary lesion: Secondary | ICD-10-CM

## 2014-03-20 DIAGNOSIS — F411 Generalized anxiety disorder: Secondary | ICD-10-CM | POA: Diagnosis present

## 2014-03-20 DIAGNOSIS — Z8673 Personal history of transient ischemic attack (TIA), and cerebral infarction without residual deficits: Secondary | ICD-10-CM | POA: Diagnosis not present

## 2014-03-20 DIAGNOSIS — N400 Enlarged prostate without lower urinary tract symptoms: Secondary | ICD-10-CM | POA: Diagnosis present

## 2014-03-20 DIAGNOSIS — F329 Major depressive disorder, single episode, unspecified: Secondary | ICD-10-CM | POA: Diagnosis present

## 2014-03-20 DIAGNOSIS — R627 Adult failure to thrive: Secondary | ICD-10-CM | POA: Diagnosis present

## 2014-03-20 DIAGNOSIS — R262 Difficulty in walking, not elsewhere classified: Secondary | ICD-10-CM | POA: Diagnosis present

## 2014-03-20 DIAGNOSIS — Z6835 Body mass index (BMI) 35.0-35.9, adult: Secondary | ICD-10-CM

## 2014-03-20 DIAGNOSIS — Z7902 Long term (current) use of antithrombotics/antiplatelets: Secondary | ICD-10-CM

## 2014-03-20 DIAGNOSIS — R32 Unspecified urinary incontinence: Secondary | ICD-10-CM | POA: Diagnosis present

## 2014-03-20 DIAGNOSIS — I739 Peripheral vascular disease, unspecified: Secondary | ICD-10-CM

## 2014-03-20 DIAGNOSIS — N184 Chronic kidney disease, stage 4 (severe): Secondary | ICD-10-CM | POA: Diagnosis present

## 2014-03-20 DIAGNOSIS — Z7982 Long term (current) use of aspirin: Secondary | ICD-10-CM | POA: Diagnosis not present

## 2014-03-20 DIAGNOSIS — Z9889 Other specified postprocedural states: Secondary | ICD-10-CM

## 2014-03-20 DIAGNOSIS — R531 Weakness: Secondary | ICD-10-CM | POA: Diagnosis present

## 2014-03-20 DIAGNOSIS — N183 Chronic kidney disease, stage 3 unspecified: Secondary | ICD-10-CM | POA: Diagnosis present

## 2014-03-20 DIAGNOSIS — I2510 Atherosclerotic heart disease of native coronary artery without angina pectoris: Secondary | ICD-10-CM | POA: Diagnosis present

## 2014-03-20 DIAGNOSIS — N401 Enlarged prostate with lower urinary tract symptoms: Secondary | ICD-10-CM | POA: Diagnosis present

## 2014-03-20 DIAGNOSIS — I059 Rheumatic mitral valve disease, unspecified: Secondary | ICD-10-CM | POA: Diagnosis present

## 2014-03-20 DIAGNOSIS — K219 Gastro-esophageal reflux disease without esophagitis: Secondary | ICD-10-CM | POA: Diagnosis present

## 2014-03-20 DIAGNOSIS — N138 Other obstructive and reflux uropathy: Secondary | ICD-10-CM | POA: Diagnosis present

## 2014-03-20 DIAGNOSIS — I779 Disorder of arteries and arterioles, unspecified: Secondary | ICD-10-CM | POA: Diagnosis present

## 2014-03-20 DIAGNOSIS — M6281 Muscle weakness (generalized): Secondary | ICD-10-CM | POA: Diagnosis present

## 2014-03-20 DIAGNOSIS — I798 Other disorders of arteries, arterioles and capillaries in diseases classified elsewhere: Secondary | ICD-10-CM

## 2014-03-20 DIAGNOSIS — G4733 Obstructive sleep apnea (adult) (pediatric): Secondary | ICD-10-CM | POA: Diagnosis present

## 2014-03-20 DIAGNOSIS — G2581 Restless legs syndrome: Secondary | ICD-10-CM | POA: Diagnosis present

## 2014-03-20 DIAGNOSIS — E669 Obesity, unspecified: Secondary | ICD-10-CM | POA: Diagnosis present

## 2014-03-20 DIAGNOSIS — M519 Unspecified thoracic, thoracolumbar and lumbosacral intervertebral disc disorder: Secondary | ICD-10-CM

## 2014-03-20 DIAGNOSIS — I83009 Varicose veins of unspecified lower extremity with ulcer of unspecified site: Secondary | ICD-10-CM

## 2014-03-20 DIAGNOSIS — E1159 Type 2 diabetes mellitus with other circulatory complications: Secondary | ICD-10-CM

## 2014-03-20 DIAGNOSIS — I359 Nonrheumatic aortic valve disorder, unspecified: Secondary | ICD-10-CM | POA: Diagnosis present

## 2014-03-20 LAB — COMPREHENSIVE METABOLIC PANEL
ALBUMIN: 3.4 g/dL — AB (ref 3.5–5.2)
ALT: 18 U/L (ref 0–53)
AST: 23 U/L (ref 0–37)
Alkaline Phosphatase: 107 U/L (ref 39–117)
Anion gap: 12 (ref 5–15)
BILIRUBIN TOTAL: 0.7 mg/dL (ref 0.3–1.2)
BUN: 29 mg/dL — ABNORMAL HIGH (ref 6–23)
CO2: 31 meq/L (ref 19–32)
Calcium: 8.8 mg/dL (ref 8.4–10.5)
Chloride: 99 mEq/L (ref 96–112)
Creatinine, Ser: 1.65 mg/dL — ABNORMAL HIGH (ref 0.50–1.35)
GFR calc Af Amer: 44 mL/min — ABNORMAL LOW (ref >90)
GFR, EST NON AFRICAN AMERICAN: 38 mL/min — AB (ref >90)
Glucose, Bld: 121 mg/dL — ABNORMAL HIGH (ref 70–99)
POTASSIUM: 4.4 meq/L (ref 3.7–5.3)
SODIUM: 142 meq/L (ref 137–147)
Total Protein: 7.4 g/dL (ref 6.0–8.3)

## 2014-03-20 LAB — CBC
HEMATOCRIT: 46.4 % (ref 39.0–52.0)
HEMOGLOBIN: 14.9 g/dL (ref 13.0–17.0)
MCH: 30.9 pg (ref 26.0–34.0)
MCHC: 32.1 g/dL (ref 30.0–36.0)
MCV: 96.3 fL (ref 78.0–100.0)
Platelets: 110 10*3/uL — ABNORMAL LOW (ref 150–400)
RBC: 4.82 MIL/uL (ref 4.22–5.81)
RDW: 15.3 % (ref 11.5–15.5)
WBC: 12 10*3/uL — ABNORMAL HIGH (ref 4.0–10.5)

## 2014-03-20 LAB — CBC WITH DIFFERENTIAL/PLATELET
Basophils Absolute: 0 10*3/uL (ref 0.0–0.1)
Basophils Relative: 0 % (ref 0–1)
Eosinophils Absolute: 0 10*3/uL (ref 0.0–0.7)
Eosinophils Relative: 0 % (ref 0–5)
HEMATOCRIT: 48.4 % (ref 39.0–52.0)
Hemoglobin: 15.4 g/dL (ref 13.0–17.0)
LYMPHS PCT: 6 % — AB (ref 12–46)
Lymphs Abs: 1 10*3/uL (ref 0.7–4.0)
MCH: 31 pg (ref 26.0–34.0)
MCHC: 31.8 g/dL (ref 30.0–36.0)
MCV: 97.4 fL (ref 78.0–100.0)
Monocytes Absolute: 1.2 10*3/uL — ABNORMAL HIGH (ref 0.1–1.0)
Monocytes Relative: 8 % (ref 3–12)
NEUTROS PCT: 86 % — AB (ref 43–77)
Neutro Abs: 13.2 10*3/uL — ABNORMAL HIGH (ref 1.7–7.7)
Platelets: 114 10*3/uL — ABNORMAL LOW (ref 150–400)
RBC: 4.97 MIL/uL (ref 4.22–5.81)
RDW: 15.3 % (ref 11.5–15.5)
WBC: 15.4 10*3/uL — AB (ref 4.0–10.5)

## 2014-03-20 LAB — I-STAT ARTERIAL BLOOD GAS, ED
Acid-Base Excess: 6 mmol/L — ABNORMAL HIGH (ref 0.0–2.0)
Bicarbonate: 32.2 mEq/L — ABNORMAL HIGH (ref 20.0–24.0)
O2 SAT: 91 %
PCO2 ART: 50.4 mmHg — AB (ref 35.0–45.0)
PO2 ART: 62 mmHg — AB (ref 80.0–100.0)
TCO2: 34 mmol/L (ref 0–100)
pH, Arterial: 7.414 (ref 7.350–7.450)

## 2014-03-20 LAB — URINALYSIS, ROUTINE W REFLEX MICROSCOPIC
BILIRUBIN URINE: NEGATIVE
GLUCOSE, UA: NEGATIVE mg/dL
Hgb urine dipstick: NEGATIVE
KETONES UR: NEGATIVE mg/dL
Nitrite: NEGATIVE
Protein, ur: NEGATIVE mg/dL
Specific Gravity, Urine: 1.014 (ref 1.005–1.030)
Urobilinogen, UA: 1 mg/dL (ref 0.0–1.0)
pH: 7 (ref 5.0–8.0)

## 2014-03-20 LAB — VITAMIN B12: Vitamin B-12: 1373 pg/mL — ABNORMAL HIGH (ref 211–911)

## 2014-03-20 LAB — PRO B NATRIURETIC PEPTIDE: PRO B NATRI PEPTIDE: 762.3 pg/mL — AB (ref 0–450)

## 2014-03-20 LAB — CREATININE, SERUM
Creatinine, Ser: 1.47 mg/dL — ABNORMAL HIGH (ref 0.50–1.35)
GFR calc Af Amer: 51 mL/min — ABNORMAL LOW (ref >90)
GFR, EST NON AFRICAN AMERICAN: 44 mL/min — AB (ref >90)

## 2014-03-20 LAB — URINE MICROSCOPIC-ADD ON

## 2014-03-20 LAB — TROPONIN I: Troponin I: <0.3 ng/mL (ref <0.30)

## 2014-03-20 LAB — I-STAT CG4 LACTIC ACID, ED: Lactic Acid, Venous: 1.65 mmol/L (ref 0.5–2.2)

## 2014-03-20 MED ORDER — ONDANSETRON HCL 4 MG/2ML IJ SOLN: 4.0000 mg | Freq: Four times a day (QID) | INTRAMUSCULAR | Status: DC | PRN

## 2014-03-20 MED ORDER — MIRABEGRON ER 50 MG PO TB24
50.0000 mg | ORAL_TABLET | Freq: Every day | ORAL | Status: DC
  Administered 2014-03-20 – 2014-03-21 (×2): 50 mg via ORAL
  Filled 2014-03-20 (×2): qty 1

## 2014-03-20 MED ORDER — SODIUM CHLORIDE 0.9 % IV SOLN
INTRAVENOUS | Status: AC
  Administered 2014-03-20 – 2014-03-21 (×2): via INTRAVENOUS

## 2014-03-20 MED ORDER — OMEGA-3-ACID ETHYL ESTERS 1 G PO CAPS
1.0000 g | ORAL_CAPSULE | Freq: Two times a day (BID) | ORAL | Status: DC
  Administered 2014-03-20 – 2014-03-21 (×2): 1 g via ORAL
  Filled 2014-03-20 (×3): qty 1

## 2014-03-20 MED ORDER — HYDROCODONE-ACETAMINOPHEN 5-325 MG PO TABS
1.0000 | ORAL_TABLET | ORAL | Status: DC | PRN
  Administered 2014-03-21: 2 via ORAL
  Filled 2014-03-20: qty 2

## 2014-03-20 MED ORDER — ALUM & MAG HYDROXIDE-SIMETH 200-200-20 MG/5ML PO SUSP: 30.0000 mL | Freq: Four times a day (QID) | ORAL | Status: DC | PRN

## 2014-03-20 MED ORDER — ALLOPURINOL 300 MG PO TABS
300.0000 mg | ORAL_TABLET | Freq: Every day | ORAL | Status: DC
  Administered 2014-03-20 – 2014-03-21 (×2): 300 mg via ORAL
  Filled 2014-03-20 (×2): qty 1

## 2014-03-20 MED ORDER — TAMSULOSIN HCL 0.4 MG PO CAPS
0.4000 mg | ORAL_CAPSULE | Freq: Every day | ORAL | Status: DC
  Administered 2014-03-20 – 2014-03-21 (×2): 0.4 mg via ORAL
  Filled 2014-03-20 (×2): qty 1

## 2014-03-20 MED ORDER — SODIUM CHLORIDE 0.9 % IJ SOLN: 3.0000 mL | Freq: Two times a day (BID) | INTRAMUSCULAR | Status: DC

## 2014-03-20 MED ORDER — POLYETHYLENE GLYCOL 3350 17 G PO PACK
17.0000 g | PACK | Freq: Every day | ORAL | Status: DC | PRN
  Filled 2014-03-20: qty 1

## 2014-03-20 MED ORDER — NITROGLYCERIN 0.4 MG SL SUBL: 0.4000 mg | SUBLINGUAL_TABLET | SUBLINGUAL | Status: DC | PRN

## 2014-03-20 MED ORDER — MIRTAZAPINE 15 MG PO TABS
15.0000 mg | ORAL_TABLET | Freq: Every day | ORAL | Status: DC
  Administered 2014-03-20: 15 mg via ORAL
  Filled 2014-03-20 (×2): qty 1

## 2014-03-20 MED ORDER — ASPIRIN EC 81 MG PO TBEC
81.0000 mg | DELAYED_RELEASE_TABLET | Freq: Every day | ORAL | Status: DC
  Administered 2014-03-20 – 2014-03-21 (×2): 81 mg via ORAL
  Filled 2014-03-20 (×2): qty 1

## 2014-03-20 MED ORDER — FUROSEMIDE 80 MG PO TABS
80.0000 mg | ORAL_TABLET | Freq: Two times a day (BID) | ORAL | Status: DC
  Filled 2014-03-20: qty 1

## 2014-03-20 MED ORDER — ROPINIROLE HCL 1 MG PO TABS
4.0000 mg | ORAL_TABLET | Freq: Every day | ORAL | Status: DC
Start: 2014-03-20 — End: 2014-03-21
  Administered 2014-03-20: 4 mg via ORAL
  Filled 2014-03-20 (×2): qty 4

## 2014-03-20 MED ORDER — LORAZEPAM 1 MG PO TABS
1.0000 mg | ORAL_TABLET | Freq: Every morning | ORAL | Status: DC
  Administered 2014-03-20 – 2014-03-21 (×2): 1 mg via ORAL
  Filled 2014-03-20 (×2): qty 1

## 2014-03-20 MED ORDER — ONDANSETRON HCL 4 MG PO TABS: 4.0000 mg | ORAL_TABLET | Freq: Four times a day (QID) | ORAL | Status: DC | PRN

## 2014-03-20 MED ORDER — BUPROPION HCL ER (XL) 150 MG PO TB24
150.0000 mg | ORAL_TABLET | Freq: Every morning | ORAL | Status: DC
  Administered 2014-03-20 – 2014-03-21 (×2): 150 mg via ORAL
  Filled 2014-03-20 (×2): qty 1

## 2014-03-20 MED ORDER — FUROSEMIDE 80 MG PO TABS
80.0000 mg | ORAL_TABLET | Freq: Two times a day (BID) | ORAL | Status: DC
  Administered 2014-03-20 – 2014-03-21 (×2): 80 mg via ORAL
  Filled 2014-03-20 (×4): qty 1

## 2014-03-20 MED ORDER — PANTOPRAZOLE SODIUM 40 MG PO TBEC
40.0000 mg | DELAYED_RELEASE_TABLET | Freq: Every day | ORAL | Status: DC
  Administered 2014-03-20 – 2014-03-21 (×2): 40 mg via ORAL
  Filled 2014-03-20 (×2): qty 1

## 2014-03-20 MED ORDER — DEXTROSE 5 % IV SOLN
100.0000 mg | Freq: Two times a day (BID) | INTRAVENOUS | Status: DC
  Administered 2014-03-20 – 2014-03-21 (×2): 100 mg via INTRAVENOUS
  Filled 2014-03-20 (×4): qty 100

## 2014-03-20 MED ORDER — GUAIFENESIN-DM 100-10 MG/5ML PO SYRP
5.0000 mL | ORAL_SOLUTION | ORAL | Status: DC | PRN
  Filled 2014-03-20: qty 5

## 2014-03-20 MED ORDER — POTASSIUM CHLORIDE CRYS ER 20 MEQ PO TBCR
20.0000 meq | EXTENDED_RELEASE_TABLET | Freq: Two times a day (BID) | ORAL | Status: DC
  Administered 2014-03-20 – 2014-03-21 (×3): 20 meq via ORAL
  Filled 2014-03-20 (×4): qty 1

## 2014-03-20 MED ORDER — VITAMIN B-12 1000 MCG PO TABS
1000.0000 ug | ORAL_TABLET | Freq: Every day | ORAL | Status: DC
  Administered 2014-03-20 – 2014-03-21 (×2): 1000 ug via ORAL
  Filled 2014-03-20 (×2): qty 1

## 2014-03-20 MED ORDER — MAGNESIUM OXIDE 400 (241.3 MG) MG PO TABS
200.0000 mg | ORAL_TABLET | Freq: Every day | ORAL | Status: DC
  Administered 2014-03-21: 200 mg via ORAL
  Filled 2014-03-20: qty 0.5

## 2014-03-20 MED ORDER — HEPARIN SODIUM (PORCINE) 5000 UNIT/ML IJ SOLN
5000.0000 [IU] | Freq: Three times a day (TID) | INTRAMUSCULAR | Status: DC
  Administered 2014-03-20 – 2014-03-21 (×2): 5000 [IU] via SUBCUTANEOUS
  Filled 2014-03-20 (×3): qty 1

## 2014-03-20 MED ORDER — DEXTROSE 5 % IV SOLN
1.0000 g | INTRAVENOUS | Status: DC
  Filled 2014-03-20: qty 10

## 2014-03-20 MED ORDER — FERROUS GLUCONATE 324 (38 FE) MG PO TABS
324.0000 mg | ORAL_TABLET | Freq: Every day | ORAL | Status: DC
  Administered 2014-03-21: 324 mg via ORAL
  Filled 2014-03-20: qty 1

## 2014-03-20 MED ORDER — CLOPIDOGREL BISULFATE 75 MG PO TABS
75.0000 mg | ORAL_TABLET | Freq: Every day | ORAL | Status: DC
  Administered 2014-03-20 – 2014-03-21 (×2): 75 mg via ORAL
  Filled 2014-03-20 (×2): qty 1

## 2014-03-20 MED ORDER — VITAMIN D3 25 MCG (1000 UNIT) PO TABS
1000.0000 [IU] | ORAL_TABLET | Freq: Every day | ORAL | Status: DC
  Administered 2014-03-21: 1000 [IU] via ORAL
  Filled 2014-03-20: qty 1

## 2014-03-20 MED ORDER — METOPROLOL TARTRATE 50 MG PO TABS
50.0000 mg | ORAL_TABLET | Freq: Two times a day (BID) | ORAL | Status: DC
  Administered 2014-03-20 – 2014-03-21 (×2): 50 mg via ORAL
  Filled 2014-03-20 (×3): qty 1

## 2014-03-20 MED ORDER — MAGNESIUM 250 MG PO TABS: 250.0000 mg | ORAL_TABLET | Freq: Every day | ORAL | Status: DC

## 2014-03-20 MED ORDER — DEXTROSE 5 % IV SOLN
1.0000 g | Freq: Once | INTRAVENOUS | Status: AC
  Administered 2014-03-20: 1 g via INTRAVENOUS
  Filled 2014-03-20: qty 10

## 2014-03-20 MED ORDER — PREGABALIN 75 MG PO CAPS
200.0000 mg | ORAL_CAPSULE | Freq: Two times a day (BID) | ORAL | Status: DC
  Administered 2014-03-20 – 2014-03-21 (×2): 200 mg via ORAL
  Filled 2014-03-20 (×2): qty 1
  Filled 2014-03-20 (×2): qty 2

## 2014-03-20 NOTE — ED Provider Notes (Signed)
CSN: [id number]     Arrival date & time 03/20/14  0732 History   First MD Initiated Contact with Patient 03/20/14 [phone]     Chief Complaint  Patient presents with  . Weakness   HPI  Mr. [name] is a 80 year old gentleman with a history of coronary artery disease, TIA, spinal stenosis, gout, objective sleep apnea presents with lower extremity weakness since last night.  Patient reports that he was trying to walk last night when his legs just buckled due to weakness. He denies any episodes of dizziness or prodrome before this. He denies any fall or injury to any other part of his body during this episode. Since then, he states that he has been unable to ambulate and get out of bed. He also endorses some dyspnea on exertion, but this has lasted for several weeks. Notably, the patient was admitted between July 18 and 02/10/2014 for similar symptoms, thought to be multifactorial at that point. Patient denies any new cough, fevers, chills, dysuria, hematuria, or abdominal pain. He does endorse some constipation.  Past Medical History  Diagnosis Date  . Gout, unspecified     severe dz, "treatment done that last 15 years"  . OBESITY   . RESTLESS LEG SYNDROME   . DETACHMENT, RETINAL NOS 1994  . CATARACT NOS 2008  . HYPERTENSION   . CORONARY ARTERY DISEASE   . PERICARDITIS, ACUTE NEC 2007    MSSA, s/p pericardial window  . GERD   . DEGENERATIVE JOINT DISEASE   . CAROTID ENDARTERECTOMY, LEFT, HX OF   . OBSTRUCTIVE SLEEP APNEA     CPAP qhs  . TIA (transient ischemic attack) 2005  . BPH (benign prostatic hypertrophy)     "minor"  . Lumbar spinal stenosis   . Carotid artery occlusion   . Unspecified venous (peripheral) insufficiency   . Stroke May 2003  . MYOCARDIAL INFARCTION 2004, 03/2010    "minor"  . Anxiety   . Depression   . Shortness of breath    Past Surgical History  Procedure Laterality Date  . Pericardial window  2007  . Left arm  2008    shoulder  . Right arm  1970's   shoulder  . Cataract extraction      Left side x's 2 and right  . Retinal detachment surgery      left side  . Carotid endarterectomy Left 12-06-05    cea  . Coronary angioplasty  01/2009    x 1 stent  . Colonoscopy    . Carpal tunnel release Bilateral   . Back surgery  2013    removed bone spurs  . Inguinal hernia repair Left 10/01/2013    Procedure: HERNIA REPAIR INGUINAL ADULT;  Surgeon: Axel Filler, MD;  Location: [facility];  Service: General;  Laterality: Left;  . Insertion of mesh Left 10/01/2013    Procedure: INSERTION OF MESH;  Surgeon: Axel Filler, MD;  Location: [facility];  Service: General;  Laterality: Left;  . Hernia repair    . Finger amputation  2015    JUST TO FIRST JOINT RIGHT HAND  LAST FINGER   Family History  Problem Relation Age of Onset  . Heart disease Mother   . Diabetes Father   . Heart disease Father   . Breast cancer Sister   . Cancer Sister   . Prostate cancer Brother   . Lung cancer Brother   . Cancer Brother   . Diabetes Daughter   . Diabetes Son  History  Substance Use Topics  . Smoking status: Never Smoker   . Smokeless tobacco: Never Used  . Alcohol Use: No    Review of Systems Ext: Bilateral leg weakness GI: Constipation All other systems otherwise negative.   Allergies  Atorvastatin  Home Medications   Prior to Admission medications   Medication Sig Start Date End Date Taking? Authorizing Provider  allopurinol (ZYLOPRIM) 300 MG tablet Take 1 tablet (300 mg total) by mouth daily. 10/08/13  Yes Newt Lukes, MD  aspirin EC 81 MG tablet Take 81 mg by mouth daily.   Yes Historical Provider, MD  buPROPion (WELLBUTRIN XL) 150 MG 24 hr tablet Take 1 tablet (150 mg total) by mouth every morning. 10/08/13  Yes Newt Lukes, MD  cholecalciferol (VITAMIN D) 1000 UNITS tablet Take 1,000 Units by mouth daily.   Yes Historical Provider, MD  clopidogrel (PLAVIX) 75 MG tablet Take 1 tablet (75 mg total) by mouth daily. 10/08/13   Yes Newt Lukes, MD  Cyanocobalamin (B-12) 1000 MCG CAPS Take 1,000 mg by mouth daily.   Yes Historical Provider, MD  Ferrous Gluconate 256 (28 FE) MG TABS Take 256 mg by mouth daily.   Yes Historical Provider, MD  furosemide (LASIX) 40 MG tablet Take 2 tablets (80 mg total) by mouth 2 (two) times daily. 10/08/13  Yes Newt Lukes, MD  LORazepam (ATIVAN) 1 MG tablet Take 1 tablet (1 mg total) by mouth every morning. 10/08/13  Yes Corwin Levins, MD  Magnesium 250 MG TABS Take 250 mg by mouth daily.    Yes Historical Provider, MD  metoprolol (LOPRESSOR) 50 MG tablet Take 1 tablet (50 mg total) by mouth 2 (two) times daily. 10/08/13  Yes Newt Lukes, MD  mirabegron ER (MYRBETRIQ) 50 MG TB24 tablet Take 50 mg by mouth daily.   Yes Historical Provider, MD  mirtazapine (REMERON) 15 MG tablet Take 1 tablet (15 mg total) by mouth at bedtime. 10/08/13  Yes Newt Lukes, MD  nitroGLYCERIN (NITROSTAT) 0.4 MG SL tablet Place 0.4 mg under the tongue every 5 (five) minutes as needed. For chest pain   Yes Historical Provider, MD  Omega-3 Fatty Acids (FISH OIL) 1000 MG CAPS Take 1,000 mg by mouth daily.   Yes Historical Provider, MD  OVER THE COUNTER MEDICATION Apply 1 application topically every other day. "OTC Medicaited Ointment for sores"   Yes Historical Provider, MD  oxyCODONE-acetaminophen (PERCOCET) 10-325 MG per tablet Take 1 tablet by mouth every 8 (eight) hours as needed for pain. 01/19/14  Yes Newt Lukes, MD  potassium chloride SA (K-DUR,KLOR-CON) 20 MEQ tablet Take 1 tablet (20 mEq total) by mouth 2 (two) times daily. 10/08/13  Yes Newt Lukes, MD  pregabalin (LYRICA) 200 MG capsule Take 1 capsule (200 mg total) by mouth 2 (two) times daily. Take 1 capsule ( ) by mouth twice a day 03/03/14  Yes Newt Lukes, MD  rOPINIRole (REQUIP) 4 MG tablet Take 4 mg by mouth at bedtime.   Yes Historical Provider, MD  tamsulosin (FLOMAX) 0.4 MG CAPS capsule Take 1 capsule  (0.4 mg total) by mouth daily. 10/08/13  Yes Newt Lukes, MD  pantoprazole (PROTONIX) 40 MG tablet Take 1 tablet (40 mg total) by mouth 2 (two) times daily. 02/10/14   Zannie Cove, MD   BP 139/58  Pulse 64  Temp(Src) 97.8 F (36.6 C) (Oral)  Resp 24  Ht  (1.753 m)  Wt 245 lb (111.131  kg)  BMI 36.16 kg/m2  SpO2 93% Physical Exam General: resting in bed HEENT: PERRL, EOMI, no scleral icterus Cardiac: RRR, no rubs, murmurs or gallops Pulm: clear to auscultation bilaterally, moving normal volumes of air Abd: soft, nontender, nondistended, BS present Ext: warm and well perfused, edematous legs with signs of chronic venous stasis, no purulent wounds Neuro: alert and oriented X3, cranial nerves II-XII grossly intact, strength 5+ in bilateral lower extremities, sensation intact in bilateral lower extremities, reflexes intact, coordination intact on finger to nose  ED Course  Procedures (including critical care time) Labs Review Labs Reviewed  CBC WITH DIFFERENTIAL - Abnormal; Notable for the following:    WBC 15.4 (*)    Platelets 114 (*)    Neutrophils Relative % 86 (*)    Neutro Abs 13.2 (*)    Lymphocytes Relative 6 (*)    Monocytes Absolute 1.2 (*)    All other components within normal limits  COMPREHENSIVE METABOLIC PANEL - Abnormal; Notable for the following:    Glucose, Bld 121 (*)    BUN 29 (*)    Creatinine, Ser 1.65 (*)    Albumin 3.4 (*)    GFR calc non Af Amer 38 (*)    GFR calc Af Amer 44 (*)    All other components within normal limits  URINALYSIS, ROUTINE W REFLEX MICROSCOPIC - Abnormal; Notable for the following:    Leukocytes, UA TRACE (*)    All other components within normal limits  PRO B NATRIURETIC PEPTIDE - Abnormal; Notable for the following:    Pro B Natriuretic peptide (BNP) 762.3 (*)    All other components within normal limits  URINE MICROSCOPIC-ADD ON - Abnormal; Notable for the following:    Bacteria, UA FEW (*)    All other  components within normal limits  CBC - Abnormal; Notable for the following:    WBC 12.0 (*)    Platelets 110 (*)    All other components within normal limits  CREATININE, SERUM - Abnormal; Notable for the following:    Creatinine, Ser 1.47 (*)    GFR calc non Af Amer 44 (*)    GFR calc Af Amer 51 (*)    All other components within normal limits  I-STAT ARTERIAL BLOOD GAS, ED - Abnormal; Notable for the following:    pCO2 arterial 50.4 (*)    pO2, Arterial 62.0 (*)    Bicarbonate 32.2 (*)    Acid-Base Excess 6.0 (*)    All other components within normal limits  CULTURE, BLOOD (ROUTINE X 2)  CULTURE, BLOOD (ROUTINE X 2)  URINE CULTURE  TROPONIN I  VITAMIN B12  I-STAT CG4 LACTIC ACID, ED    Imaging Review Dg Abd 1 View  03/20/2014   CLINICAL DATA:  Constipation.  Abdominal pain.  EXAM: ABDOMEN - 1 VIEW  COMPARISON:  None.  FINDINGS: Normal bowel gas pattern.  No significant increased stool.  No evidence of renal or ureteral calculi. Soft tissues are unremarkable.  Degenerative changes are noted throughout the visualized spine.  IMPRESSION: 1. No acute finding.  Normal bowel gas pattern.  No increased stool.   Electronically Signed   By: Amie Portland M.D.   On: 03/20/2014 10:42   Ct Head Wo Contrast  03/20/2014   CLINICAL DATA:  Bilateral lower extremity weakness, prior a history of myocardial infarction and stroke with residual left-hand deficit  EXAM: CT HEAD WITHOUT CONTRAST  TECHNIQUE: Contiguous axial images were obtained from the base of the skull  through the vertex without intravenous contrast.  COMPARISON:  Prior head CT 02/07/2014  FINDINGS: Negative for acute intracranial hemorrhage, acute infarction, mass, mass effect, hydrocephalus or midline shift. Gray-white differentiation is preserved throughout. No significant interval change and cerebral atrophy, ex vacuo dilatation of lateral ventricles and chronic microvascular ischemic white matter disease. Atherosclerotic  calcifications present within both cavernous carotid arteries.  IMPRESSION: No acute intracranial abnormality.   Electronically Signed   By: Malachy Moan M.D.   On: 03/20/2014 09:11   Dg Chest Portable 1 View  03/20/2014   CLINICAL DATA:  Weakness.  Swelling in the legs.  EXAM: PORTABLE CHEST - 1 VIEW  COMPARISON:  02/07/2014.  FINDINGS: Cardiac silhouette is normal in size. No mediastinal or hilar masses. There are low lung volumes. Allowing for this, the lungs are clear. No pleural effusion or pneumothorax.  Bony thorax is demineralized but grossly intact.  IMPRESSION: No acute cardiopulmonary disease.   Electronically Signed   By: Amie Portland M.D.   On: 03/20/2014 08:15     EKG Interpretation None     MDM   Final diagnoses:  Weakness generalized  Aortic valve disease  BPH (benign prostatic hypertrophy)  Coronary artery disease due to calcified coronary lesion  Chronic kidney disease (CKD), stage IV (severe)  Gastroesophageal reflux disease without esophagitis  Lumbar disc disease  Obesity (BMI 30-39.9)  Obstructive sleep apnea, adult  Type 2 diabetes mellitus with vascular disease  Venous stasis ulcer, unspecified laterality   Patient is a 80 year old gentleman with coronary artery disease, history of TIA, spinal stenosis, gout, OSA, chronic venous stasis and pericarditis who presents with bilateral lower extremity weakness with loss of ability to ambulate. Patient currently denies any chest pain, fevers, chills. Patient recently admitted between July 18 in 02/10/2014 for similar symptoms and was thought to be multifactorial at the time. Strength intact in bilateral lower extremities. Coordination intact on exam. Intracranial etiology for weakness less likely considering reassuring neuro exam. ACS a possibility considering multiple risk factors and reported dyspnea on exertion, though no current symptoms. Diffuse erythema and wounds in bilateral lower extremities with surrounding.  EKG with no acute abnormalities. Vitals within normal limits.  - Blood cultures, CBC, CMP, lactic acid, troponin, urinalysis, chest x-ray, abdominal x-ray  9:44 AM  - CBC remarkable for a leukocytosis up to 15.4  - CMP remarkable for creatinine 1.65, stable with priors  - BNP elevated at 762, up from 325 last month  - CT head with no intracranial pathology  - Chest x-ray with no acute abnormalities  - Urinalysis pending  10:53 AM  - Abdominal x-ray not concerning for any signs obstruction.  - Hospitalist to admit.  Mason Barban, MD 03/20/14 [phone]

## 2014-03-20 NOTE — H&P (Signed)
Patient Demographics  [name], is a 80 y.o. male  MRN: [id number]   DOB - April 24, 1933  Admit Date - 03/20/2014  Outpatient Primary MD for the patient is [name], MD   With History of -  Past Medical History  Diagnosis Date  . Gout, unspecified     severe dz, "treatment done that last 15 years"  . OBESITY   . RESTLESS LEG SYNDROME   . DETACHMENT, RETINAL NOS 1994  . CATARACT NOS 2008  . HYPERTENSION   . CORONARY ARTERY DISEASE   . PERICARDITIS, ACUTE NEC 2007    MSSA, s/p pericardial window  . GERD   . DEGENERATIVE JOINT DISEASE   . CAROTID ENDARTERECTOMY, LEFT, HX OF   . OBSTRUCTIVE SLEEP APNEA     CPAP qhs  . TIA (transient ischemic attack) 2005  . BPH (benign prostatic hypertrophy)     "minor"  . Lumbar spinal stenosis   . Carotid artery occlusion   . Unspecified venous (peripheral) insufficiency   . Stroke May 2003  . MYOCARDIAL INFARCTION 2004, 03/2010    "minor"  . Anxiety   . Depression   . Shortness of breath       Past Surgical History  Procedure Laterality Date  . Pericardial window  2007  . Left arm  2008    shoulder  . Right arm  1970's    shoulder  . Cataract extraction      Left side x's 2 and right  . Retinal detachment surgery      left side  . Carotid endarterectomy Left 12-06-05    cea  . Coronary angioplasty  01/2009    x 1 stent  . Colonoscopy    . Carpal tunnel release Bilateral   . Back surgery  2013    removed bone spurs  . Inguinal hernia repair Left 10/01/2013    Procedure: HERNIA REPAIR INGUINAL ADULT;  Surgeon: Axel Filler, MD;  Location: [facility];  Service: General;  Laterality: Left;  . Insertion of mesh Left 10/01/2013    Procedure: INSERTION OF MESH;  Surgeon: Axel Filler, MD;  Location: [facility];  Service: General;  Laterality: Left;  .  Hernia repair    . Finger amputation  2015    JUST TO FIRST JOINT RIGHT HAND  LAST FINGER    in for   Chief Complaint  Patient presents with  . Weakness     HPI  [name]  is a 80 y.o. male, with history of hypertension, BPH, CAD, obstructive sleep apnea uses CPAP at night, chronic lower extremity redness and open wounds, CK D. stage IV, restless leg syndrome, spinal stenosis with chronic back pain, poor balance, who was admitted to this hospital for generalized weakness and fall a few months ago brought in by family members after he started experiencing lower extremity weakness bilaterally since yesterday evening. He complains of mild dysuria and a dry cough, no fevers, no headache,  no chest pain palpitations, no shortness of breath, no abdominal pain or diarrhea, is chronically constipated, denies any back injuries or change in his chronic back pain pattern, he has chronic bladder incontinence but no bowel incontinence. No focal weakness.  The ER workup was suggestive of mild leukocytosis due to UTI, possible mild URI, he had no focal deficits but was unable to stand up and ambulate and was feeling weak, I was called to admit the patient .    Review of Systems    In addition to the HPI above,  No Fever-chills, No Headache, No changes with Vision or hearing, No problems swallowing food or Liquids, No Chest pain, I'll cough but no shortness of breath No Abdominal pain, No Nausea or Vommitting, he constipated No Blood in stool or Urine, Mild dysuria, No new skin rashes or bruises, No new joints pains-aches,  No new weakness, tingling, numbness in any extremity focally but does have diffuse generalized weakness No recent weight gain or loss, No polyuria, polydypsia or polyphagia, No significant Mental Stressors.  A full 10 point Review of Systems was done, except as stated above, all other Review of Systems were negative.   Social History History  Substance Use Topics    . Smoking status: Never Smoker   . Smokeless tobacco: Never Used  . Alcohol Use: No      Family History Family History  Problem Relation Age of Onset  . Heart disease Mother   . Diabetes Father   . Heart disease Father   . Breast cancer Sister   . Cancer Sister   . Prostate cancer Brother   . Lung cancer Brother   . Cancer Brother   . Diabetes Daughter   . Diabetes Son     Prior to Admission medications   Medication Sig Start Date End Date Taking? Authorizing Provider  allopurinol (ZYLOPRIM) 300 MG tablet Take 1 tablet (300 mg total) by mouth daily. 10/08/13  Yes Newt Lukes, MD  aspirin EC 81 MG tablet Take 81 mg by mouth daily.   Yes Historical Provider, MD  buPROPion (WELLBUTRIN XL) 150 MG 24 hr tablet Take 1 tablet (150 mg total) by mouth every morning. 10/08/13  Yes Newt Lukes, MD  cholecalciferol (VITAMIN D) 1000 UNITS tablet Take 1,000 Units by mouth daily.   Yes Historical Provider, MD  clopidogrel (PLAVIX) 75 MG tablet Take 1 tablet (75 mg total) by mouth daily. 10/08/13  Yes Newt Lukes, MD  Cyanocobalamin (B-12) 1000 MCG CAPS Take 1,000 mg by mouth daily.   Yes Historical Provider, MD  Ferrous Gluconate 256 (28 FE) MG TABS Take 256 mg by mouth daily.   Yes Historical Provider, MD  furosemide (LASIX) 40 MG tablet Take 2 tablets (80 mg total) by mouth 2 (two) times daily. 10/08/13  Yes Newt Lukes, MD  LORazepam (ATIVAN) 1 MG tablet Take 1 tablet (1 mg total) by mouth every morning. 10/08/13  Yes Corwin Levins, MD  Magnesium 250 MG TABS Take 250 mg by mouth daily.    Yes Historical Provider, MD  metoprolol (LOPRESSOR) 50 MG tablet Take 1 tablet (50 mg total) by mouth 2 (two) times daily. 10/08/13  Yes Newt Lukes, MD  mirabegron ER (MYRBETRIQ) 50 MG TB24 tablet Take 50 mg by mouth daily.   Yes Historical Provider, MD  mirtazapine (REMERON) 15 MG tablet Take 1 tablet (15 mg total) by mouth at bedtime. 10/08/13  Yes Newt Lukes, MD   nitroGLYCERIN (  NITROSTAT) 0.4 MG SL tablet Place 0.4 mg under the tongue every 5 (five) minutes as needed. For chest pain   Yes Historical Provider, MD  Omega-3 Fatty Acids (FISH OIL) 1000 MG CAPS Take 1,000 mg by mouth daily.   Yes Historical Provider, MD  OVER THE COUNTER MEDICATION Apply 1 application topically every other day. "OTC Medicaited Ointment for sores"   Yes Historical Provider, MD  oxyCODONE-acetaminophen (PERCOCET) 10-325 MG per tablet Take 1 tablet by mouth every 8 (eight) hours as needed for pain. 01/19/14  Yes Newt Lukes, MD  potassium chloride SA (K-DUR,KLOR-CON) 20 MEQ tablet Take 1 tablet (20 mEq total) by mouth 2 (two) times daily. 10/08/13  Yes Newt Lukes, MD  pregabalin (LYRICA) 200 MG capsule Take 1 capsule (200 mg total) by mouth 2 (two) times daily. Take 1 capsule (200mg ) by mouth twice a day 03/03/14  Yes Newt Lukes, MD  rOPINIRole (REQUIP) 4 MG tablet Take 4 mg by mouth at bedtime.   Yes Historical Provider, MD  tamsulosin (FLOMAX) 0.4 MG CAPS capsule Take 1 capsule (0.4 mg total) by mouth daily. 10/08/13  Yes Newt Lukes, MD  pantoprazole (PROTONIX) 40 MG tablet Take 1 tablet (40 mg total) by mouth 2 (two) times daily. 02/10/14   Zannie Cove, MD    Allergies  Allergen Reactions  . Atorvastatin Other (See Comments)    REACTION: tol simvastatin ok per pt ( Pt. Says ALL the Statins ) Severe muscle weakness    Physical Exam  Vitals  Blood pressure 133/54, pulse 61, temperature 98.9 F (37.2 C), temperature source Oral, resp. rate 22, height 5\' 9"  (1.753 m), weight 111.131 kg (245 lb), SpO2 92.00%.   1. General anxious elderly obese white male lying in bed in NAD,    2. Normal affect and insight, Not Suicidal or Homicidal, Awake Alert, Oriented X 3.  3. No F.N deficits, ALL C.Nerves Intact, Strength 5/5 all 4 extremities, Sensation intact all 4 extremities, Plantars down going.  4. Ears and Eyes appear Normal, Conjunctivae clear,  PERRLA. Moist Oral Mucosa.  5. Supple Neck, No JVD, No cervical lymphadenopathy appriciated, No Carotid Bruits.  6. Symmetrical Chest wall movement, Good air movement bilaterally, CTAB.  7. RRR, No Gallops, Rubs or Murmurs, No Parasternal Heave.  8. Positive Bowel Sounds, Abdomen Soft, No tenderness, No organomegaly appriciated,No rebound -guarding or rigidity.  9.  No Cyanosis, Normal Skin Turgor, chronic lower extremity redness with multiple old scabs an open wounds which are all old  10. Good muscle tone,  joints appear normal , no effusions, Normal ROM.  11. No Palpable Lymph Nodes in Neck or Axillae    Data Review  CBC  Recent Labs Lab 03/20/14 0800  WBC 15.4*  HGB 15.4  HCT 48.4  PLT 114*  MCV 97.4  MCH 31.0  MCHC 31.8  RDW 15.3  LYMPHSABS 1.0  MONOABS 1.2*  EOSABS 0.0  BASOSABS 0.0   ------------------------------------------------------------------------------------------------------------------  Chemistries   Recent Labs Lab 03/20/14 0800  NA 142  K 4.4  CL 99  CO2 31  GLUCOSE 121*  BUN 29*  CREATININE 1.65*  CALCIUM 8.8  AST 23  ALT 18  ALKPHOS 107  BILITOT 0.7   ------------------------------------------------------------------------------------------------------------------ estimated creatinine clearance is 44.6 ml/min (by C-G formula based on Cr of 1.65). ------------------------------------------------------------------------------------------------------------------ No results found for this basename: TSH, T4TOTAL, FREET3, T3FREE, THYROIDAB,  in the last 72 hours   Coagulation profile No results found for this basename: INR, PROTIME,  in the last 168 hours ------------------------------------------------------------------------------------------------------------------- No results found for this basename: DDIMER,  in the last 72  hours -------------------------------------------------------------------------------------------------------------------  Cardiac Enzymes  Recent Labs Lab 03/20/14 0800  TROPONINI <0.30   ------------------------------------------------------------------------------------------------------------------ No components found with this basename: POCBNP,    ---------------------------------------------------------------------------------------------------------------  Urinalysis    Component Value Date/Time   COLORURINE YELLOW 03/20/2014 0927   APPEARANCEUR CLEAR 03/20/2014 0927   LABSPEC 1.014 03/20/2014 0927   PHURINE 7.0 03/20/2014 0927   GLUCOSEU NEGATIVE 03/20/2014 0927   GLUCOSEU NEGATIVE 09/23/2012 1135   HGBUR NEGATIVE 03/20/2014 0927   BILIRUBINUR NEGATIVE 03/20/2014 0927   KETONESUR NEGATIVE 03/20/2014 0927   PROTEINUR NEGATIVE 03/20/2014 0927   UROBILINOGEN 1.0 03/20/2014 0927   NITRITE NEGATIVE 03/20/2014 0927   LEUKOCYTESUR TRACE* 03/20/2014 0927    ----------------------------------------------------------------------------------------------------------------  Imaging results:   Dg Abd 1 View  03/20/2014   CLINICAL DATA:  Constipation.  Abdominal pain.  EXAM: ABDOMEN - 1 VIEW  COMPARISON:  None.  FINDINGS: Normal bowel gas pattern.  No significant increased stool.  No evidence of renal or ureteral calculi. Soft tissues are unremarkable.  Degenerative changes are noted throughout the visualized spine.  IMPRESSION: 1. No acute finding.  Normal bowel gas pattern.  No increased stool.   Electronically Signed   By: Amie Portland M.D.   On: 03/20/2014 10:42   Ct Head Wo Contrast  03/20/2014   CLINICAL DATA:  Bilateral lower extremity weakness, prior a history of myocardial infarction and stroke with residual left-hand deficit  EXAM: CT HEAD WITHOUT CONTRAST  TECHNIQUE: Contiguous axial images were obtained from the base of the skull through the vertex without intravenous contrast.   COMPARISON:  Prior head CT 02/07/2014  FINDINGS: Negative for acute intracranial hemorrhage, acute infarction, mass, mass effect, hydrocephalus or midline shift. Gray-white differentiation is preserved throughout. No significant interval change and cerebral atrophy, ex vacuo dilatation of lateral ventricles and chronic microvascular ischemic white matter disease. Atherosclerotic calcifications present within both cavernous carotid arteries.  IMPRESSION: No acute intracranial abnormality.   Electronically Signed   By: Malachy Moan M.D.   On: 03/20/2014 09:11   Dg Chest Portable 1 View  03/20/2014   CLINICAL DATA:  Weakness.  Swelling in the legs.  EXAM: PORTABLE CHEST - 1 VIEW  COMPARISON:  02/07/2014.  FINDINGS: Cardiac silhouette is normal in size. No mediastinal or hilar masses. There are low lung volumes. Allowing for this, the lungs are clear. No pleural effusion or pneumothorax.  Bony thorax is demineralized but grossly intact.  IMPRESSION: No acute cardiopulmonary disease.   Electronically Signed   By: Amie Portland M.D.   On: 03/20/2014 08:15         Assessment & Plan    1. Generalized weakness worse in both legs - secondary to combination of UTI/URI, mild dehydration and generalized deconditioning due to advanced age. Will be admitted to a telemetry bed, will obtain baseline EKG as he has underlying CAD, blood urine cultures have been ordered in the ER, for now IV Rocephin for UTI and will add doxycycline for possible URI and open lower extremity leg wounds. We'll have PT evaluate and increase activity as tolerated.   2. UTI/URI. Rocephin doxycycline.   3. Chronic  lower extremity redness and multiple open leg wounds. Local wound care, doxycycline   4. Chronic spinal stenosis with chronic back pain. Supportive care. Minimize narcotic use. PT eval and treat. No focal deficits. No signs of cauda equina.   5. Obstructive sleep apnea. CPAP  at night.   6. CK D. stage IV - recent  creatinine 1.6 he is around baseline. We'll skip tonight's Lasix and gently hydrate him overnight for #1 above.   7. History of CAD. No acute issues no chest pain. Will obtain baseline EKG, monitor on telemetry for 24 hours, continue aspirin-Plavix-beta blocker.   8. History of B12 deficiency. Check B12 level continue home dose B12 supplementation.   9. History of gout. No acute flare continue allopurinol   10. GERD. On PPI continue.   11. BPH. Continue Flomax.    DVT Prophylaxis Heparin   AM Labs Ordered, also please review Full Orders  Family Communication: Admission, patients condition and plan of care including tests being ordered have been discussed with the patient and son who indicate understanding and agree with the plan and Code Status.  Code Status Full  Likely DC to  TBD  Condition GUARDED     Time spent in minutes : 35    Amoni Morales K M.D on 03/20/2014 at 11:21 AM  Between 7am to 7pm - Pager - [phone]  After 7pm go to [email or web address] - password [email or web address]  And look for the night coverage person covering me after hours  [facility] Office  [phone]   **Disclaimer: This note may have been dictated with voice recognition software. Similar sounding words can inadvertently be transcribed and this note may contain transcription errors which may not have been corrected upon publication of note.**

## 2014-03-20 NOTE — ED Notes (Signed)
Pt from home via [facility] with c/o weakness, especially bilateral legs, starting last night.  Pt stated he was too weak to get out of bed and crawled to the door to let his daughter in this morning.  Hx of MI x2, stroke with left hand deficites, HTN.  Pt in NAD, A&O.

## 2014-03-20 NOTE — ED Provider Notes (Signed)
I saw and evaluated the patient, reviewed the resident's note and I agree with the findings and plan. If applicable, I agree with the resident's interpretation of the EKG.  If applicable, I was present for critical portions of any procedures performed.  See my additional note   Glynn Octave, MD 03/20/14 1840

## 2014-03-20 NOTE — ED Notes (Signed)
Lab results of CG4 given to Dr.Rancour.

## 2014-03-20 NOTE — ED Notes (Signed)
Pt returned from xray

## 2014-03-20 NOTE — ED Notes (Signed)
Pt is unable to sit in the bed without assistance.  Did not attempted standing or ambulating due to this weakness.

## 2014-03-20 NOTE — Progress Notes (Signed)
Utilization review completed.  

## 2014-03-20 NOTE — ED Provider Notes (Signed)
I saw and evaluated the patient, reviewed the resident's note and I agree with the findings and plan. If applicable, I agree with the resident's interpretation of the EKG.  If applicable, I was present for critical portions of any procedures performed.  Generalized weakness since last night, worse in legs.  Unable to ambulate.  No fever, no change in chronic back pain, urinary incontinence at baseline. No fecal incontinence.  No distress.  CTAB, RRR.  Scabs and erythema to bilateral shins appears chronic. 5/5 strength in legs bilaterally.   Date: 03/20/2014  Rate: 67  Rhythm: normal sinus rhythm  QRS Axis: left  Intervals: normal  ST/T Wave abnormalities: normal  Conduction Disutrbances:left bundle branch block  Narrative Interpretation:   Old EKG Reviewed: unchanged    Glynn Octave, MD 03/20/14 [phone]

## 2014-03-20 NOTE — ED Notes (Signed)
Attempted report 

## 2014-03-21 DIAGNOSIS — R5383 Other fatigue: Secondary | ICD-10-CM

## 2014-03-21 DIAGNOSIS — R627 Adult failure to thrive: Secondary | ICD-10-CM

## 2014-03-21 DIAGNOSIS — R5381 Other malaise: Secondary | ICD-10-CM

## 2014-03-21 DIAGNOSIS — L97909 Non-pressure chronic ulcer of unspecified part of unspecified lower leg with unspecified severity: Secondary | ICD-10-CM

## 2014-03-21 DIAGNOSIS — I83009 Varicose veins of unspecified lower extremity with ulcer of unspecified site: Secondary | ICD-10-CM

## 2014-03-21 DIAGNOSIS — N184 Chronic kidney disease, stage 4 (severe): Secondary | ICD-10-CM

## 2014-03-21 DIAGNOSIS — N390 Urinary tract infection, site not specified: Principal | ICD-10-CM

## 2014-03-21 LAB — CBC
HCT: 46.8 % (ref 39.0–52.0)
Hemoglobin: 14.8 g/dL (ref 13.0–17.0)
MCH: 30.5 pg (ref 26.0–34.0)
MCHC: 31.6 g/dL (ref 30.0–36.0)
MCV: 96.5 fL (ref 78.0–100.0)
Platelets: 116 10*3/uL — ABNORMAL LOW (ref 150–400)
RBC: 4.85 MIL/uL (ref 4.22–5.81)
RDW: 15.2 % (ref 11.5–15.5)
WBC: 8.4 10*3/uL (ref 4.0–10.5)

## 2014-03-21 LAB — BASIC METABOLIC PANEL
Anion gap: 11 (ref 5–15)
BUN: 27 mg/dL — AB (ref 6–23)
CO2: 28 mEq/L (ref 19–32)
Calcium: 9.1 mg/dL (ref 8.4–10.5)
Chloride: 100 mEq/L (ref 96–112)
Creatinine, Ser: 1.39 mg/dL — ABNORMAL HIGH (ref 0.50–1.35)
GFR calc non Af Amer: 47 mL/min — ABNORMAL LOW (ref >90)
GFR, EST AFRICAN AMERICAN: 54 mL/min — AB (ref >90)
GLUCOSE: 131 mg/dL — AB (ref 70–99)
POTASSIUM: 4.6 meq/L (ref 3.7–5.3)
Sodium: 139 mEq/L (ref 137–147)

## 2014-03-21 LAB — URINE CULTURE: Colony Count: 10000

## 2014-03-21 MED ORDER — CEFUROXIME AXETIL 500 MG PO TABS
500.0000 mg | ORAL_TABLET | Freq: Two times a day (BID) | ORAL | Status: DC
  Filled 2014-03-21 (×2): qty 1

## 2014-03-21 MED ORDER — CEFUROXIME AXETIL 500 MG PO TABS: 500.0000 mg | ORAL_TABLET | Freq: Two times a day (BID) | ORAL | Status: DC

## 2014-03-21 NOTE — Progress Notes (Signed)
Physical Therapy Discharge Patient Details Name: [name] MRN: [id number] DOB: [birth year] Today's Date: 03/21/2014 Time:  -     Patient discharged from PT services secondary to has been d/c'd by physician and expected to leave soon.   03/21/2014  Nemaha Bing, PT [phone]  (pager)  GP     [name], [name] 03/21/2014, 2:37 PM

## 2014-03-21 NOTE — Discharge Summary (Signed)
Physician Discharge Summary  Urban Chilcote OZD:[id number] DOB: [birth year] DOA: 03/20/2014  PCP: [name], MD  Admit date: 03/20/2014 Discharge date: 03/21/2014  Time spent: 35 minutes  Recommendations for Outpatient Follow-up:  1. Patient admitted for failure to thrive in setting of urinary tract infection. He was set up with home health services prior to discharge. 2. Followup on CBC and BMP in 1 week 3. Followup on urine cultures  Discharge Diagnoses:  Principal Problem:   Weakness generalized Active Problems:   Type 2 diabetes mellitus with vascular disease   Hypelipidemia   Obesity (BMI 30-39.9)   GERD   Carotid artery disease   Obstructive sleep apnea, adult   BPH (benign prostatic hypertrophy)   Aortic stenosis   CAD (coronary artery disease)   History of pericardial window for staph pericarditis   Chronic kidney disease (CKD), stage IV (severe)   Weakness   Discharge Condition: Improved/stable  Diet recommendation: Heart healthy diet  Filed Weights   03/20/14 0736 03/21/14 0515  Weight: 111.131 kg (245 lb) 108.999 kg (240 lb 4.8 oz)    History of present illness:  Tonia Clowe is a 80 y.o. male, with history of hypertension, BPH, CAD, obstructive sleep apnea uses CPAP at night, chronic lower extremity redness and open wounds, CK D. stage IV, restless leg syndrome, spinal stenosis with chronic back pain, poor balance, who was admitted to this hospital for generalized weakness and fall a few months ago brought in by family members after he started experiencing lower extremity weakness bilaterally since yesterday evening. He complains of mild dysuria and a dry cough, no fevers, no headache, no chest pain palpitations, no shortness of breath, no abdominal pain or diarrhea, is chronically constipated, denies any back injuries or change in his chronic back pain pattern, he has chronic bladder incontinence but no bowel incontinence. No focal weakness.  The ER  workup was suggestive of mild leukocytosis due to UTI, possible mild URI, he had no focal deficits but was unable to stand up and ambulate and was feeling weak, I was called to admit the patient .   Hospital Course:  Patient is a pleasant 80 year old gentleman with a past medical history of coronary artery disease, obstructive sleep apnea, morbid obesity, stage IV chronic kidney disease and chronic venous stasis ulcers admitted to the medicine service on 03/20/2014 presenting with complaints of generalized weakness. Initial workup revealed the presence of urinary tract infection as he was started on empiric IV antibiotic therapy with ceftriaxone. CBC showed a white count of 15,400. There are no events overnight, by the following day he reported feeling significantly better. He ambulated down the hallway to the nurses station and back without any issues. Labs reviewed, white count trended down to 8400. He remained hemodynamically stable, afebrile, tolerating by mouth intake. Mr. Kuhar expresses desire to be discharged as he felt he would do better at home rather than staying in the hospital. I felt he would benefit from home health services with home RN and PT to check up on him this week. He reported living alone however had his adult children living nearby. He was discharged on a week course of Ceftin therapy. Patient discharged to his home in stable condition on 03/21/2049   Discharge Exam: Filed Vitals:   03/21/14 1021  BP: 128/58  Pulse: 60  Temp:   Resp:     General: Patient states feeling much better, he is awake and alert and oriented x3. He had related with me down the hallway  Cardiovascular: Regular rate and rhythm normal S1-S2 Respiratory: Clear to auscultation bilaterally no wheezing rhonchi or rales Abdomen: Soft nontender nondistended  Discharge Instructions You were cared for by a hospitalist during your hospital stay. If you have any questions about your discharge medications  or the care you received while you were in the hospital after you are discharged, you can call the unit and asked to speak with the hospitalist on call if the hospitalist that took care of you is not available. Once you are discharged, your primary care physician will handle any further medical issues. Please note that NO REFILLS for any discharge medications will be authorized once you are discharged, as it is imperative that you return to your primary care physician (or establish a relationship with a primary care physician if you do not have one) for your aftercare needs so that they can reassess your need for medications and monitor your lab values.  Discharge Instructions   Call MD for:  difficulty breathing, headache or visual disturbances    Complete by:  As directed      Call MD for:  extreme fatigue    Complete by:  As directed      Call MD for:  hives    Complete by:  As directed      Call MD for:  persistant dizziness or light-headedness    Complete by:  As directed      Call MD for:  persistant nausea and vomiting    Complete by:  As directed      Call MD for:  redness, tenderness, or signs of infection (pain, swelling, redness, odor or green/yellow discharge around incision site)    Complete by:  As directed      Call MD for:  severe uncontrolled pain    Complete by:  As directed      Call MD for:  temperature >100.4    Complete by:  As directed      Diet - low sodium heart healthy    Complete by:  As directed      Increase activity slowly    Complete by:  As directed             Medication List    STOP taking these medications       OVER THE COUNTER MEDICATION      TAKE these medications       allopurinol 300 MG tablet  Commonly known as:  ZYLOPRIM  Take 1 tablet (300 mg total) by mouth daily.     aspirin EC 81 MG tablet  Take 81 mg by mouth daily.     B-12 1000 MCG Caps  Take 1,000 mg by mouth daily.     buPROPion 150 MG 24 hr tablet  Commonly known as:   WELLBUTRIN XL  Take 1 tablet (150 mg total) by mouth every morning.     cefUROXime 500 MG tablet  Commonly known as:  CEFTIN  Take 1 tablet (500 mg total) by mouth 2 (two) times daily with a meal.     cholecalciferol 1000 UNITS tablet  Commonly known as:  VITAMIN D  Take 1,000 Units by mouth daily.     clopidogrel 75 MG tablet  Commonly known as:  PLAVIX  Take 1 tablet (75 mg total) by mouth daily.     Ferrous Gluconate 256 (28 FE) MG Tabs  Take 256 mg by mouth daily.     Fish Oil 1000 MG Caps  Take 1,000  mg by mouth daily.     furosemide 40 MG tablet  Commonly known as:  LASIX  Take 2 tablets (80 mg total) by mouth 2 (two) times daily.     LORazepam 1 MG tablet  Commonly known as:  ATIVAN  Take 1 tablet (1 mg total) by mouth every morning.     Magnesium 250 MG Tabs  Take 250 mg by mouth daily.     metoprolol 50 MG tablet  Commonly known as:  LOPRESSOR  Take 1 tablet (50 mg total) by mouth 2 (two) times daily.     mirtazapine 15 MG tablet  Commonly known as:  REMERON  Take 1 tablet (15 mg total) by mouth at bedtime.     MYRBETRIQ 50 MG Tb24 tablet  Generic drug:  mirabegron ER  Take 50 mg by mouth daily.     nitroGLYCERIN 0.4 MG SL tablet  Commonly known as:  NITROSTAT  Place 0.4 mg under the tongue every 5 (five) minutes as needed. For chest pain     oxyCODONE-acetaminophen 10-325 MG per tablet  Commonly known as:  PERCOCET  Take 1 tablet by mouth every 8 (eight) hours as needed for pain.     pantoprazole 40 MG tablet  Commonly known as:  PROTONIX  Take 1 tablet (40 mg total) by mouth 2 (two) times daily.     potassium chloride SA 20 MEQ tablet  Commonly known as:  K-DUR,KLOR-CON  Take 1 tablet (20 mEq total) by mouth 2 (two) times daily.     pregabalin 200 MG capsule  Commonly known as:  LYRICA  Take 1 capsule (200 mg total) by mouth 2 (two) times daily. Take 1 capsule ( ) by mouth twice a day     rOPINIRole 4 MG tablet  Commonly known as:   REQUIP  Take 4 mg by mouth at bedtime.     tamsulosin 0.4 MG Caps capsule  Commonly known as:  FLOMAX  Take 1 capsule (0.4 mg total) by mouth daily.       Allergies  Allergen Reactions  . Atorvastatin Other (See Comments)    REACTION: tol simvastatin ok per pt ( Pt. Says ALL the Statins ) Severe muscle weakness       Follow-up Information   Follow up with [name], MD In 1 week.   Specialty:  Internal Medicine   Contact information:   [address] [place] [address] [phone]        The results of significant diagnostics from this hospitalization (including imaging, microbiology, ancillary and laboratory) are listed below for reference.    Significant Diagnostic Studies: Dg Abd 1 View  03/20/2014   CLINICAL DATA:  Constipation.  Abdominal pain.  EXAM: ABDOMEN - 1 VIEW  COMPARISON:  None.  FINDINGS: Normal bowel gas pattern.  No significant increased stool.  No evidence of renal or ureteral calculi. Soft tissues are unremarkable.  Degenerative changes are noted throughout the visualized spine.  IMPRESSION: 1. No acute finding.  Normal bowel gas pattern.  No increased stool.   Electronically Signed   By: Amie Portland M.D.   On: 03/20/2014 10:42   Ct Head Wo Contrast  03/20/2014   CLINICAL DATA:  Bilateral lower extremity weakness, prior a history of myocardial infarction and stroke with residual left-hand deficit  EXAM: CT HEAD WITHOUT CONTRAST  TECHNIQUE: Contiguous axial images were obtained from the base of the skull through the vertex without intravenous contrast.  COMPARISON:  Prior head CT 02/07/2014  FINDINGS: Negative for acute intracranial hemorrhage, acute infarction, mass, mass effect, hydrocephalus or midline shift. Gray-white differentiation is preserved throughout. No significant interval change and cerebral atrophy, ex vacuo dilatation of lateral ventricles and chronic microvascular ischemic white matter disease.  Atherosclerotic calcifications present within both cavernous carotid arteries.  IMPRESSION: No acute intracranial abnormality.   Electronically Signed   By: Malachy Moan M.D.   On: 03/20/2014 09:11   Dg Chest Portable 1 View  03/20/2014   CLINICAL DATA:  Weakness.  Swelling in the legs.  EXAM: PORTABLE CHEST - 1 VIEW  COMPARISON:  02/07/2014.  FINDINGS: Cardiac silhouette is normal in size. No mediastinal or hilar masses. There are low lung volumes. Allowing for this, the lungs are clear. No pleural effusion or pneumothorax.  Bony thorax is demineralized but grossly intact.  IMPRESSION: No acute cardiopulmonary disease.   Electronically Signed   By: Amie Portland M.D.   On: 03/20/2014 08:15    Microbiology: No results found for this or any previous visit (from the past 240 hour(s)).   Labs: Basic Metabolic Panel:  Recent Labs Lab 03/20/14 0800 03/20/14 1427 03/21/14 0500  NA 142  --  139  K 4.4  --  4.6  CL 99  --  100  CO2 31  --  28  GLUCOSE 121*  --  131*  BUN 29*  --  27*  CREATININE 1.65* 1.47* 1.39*  CALCIUM 8.8  --  9.1   Liver Function Tests:  Recent Labs Lab 03/20/14 0800  AST 23  ALT 18  ALKPHOS 107  BILITOT 0.7  PROT 7.4  ALBUMIN 3.4*   No results found for this basename: LIPASE, AMYLASE,  in the last 168 hours No results found for this basename: AMMONIA,  in the last 168 hours CBC:  Recent Labs Lab 03/20/14 0800 03/20/14 1427 03/21/14 0500  WBC 15.4* 12.0* 8.4  NEUTROABS 13.2*  --   --   HGB 15.4 14.9 14.8  HCT 48.4 46.4 46.8  MCV 97.4 96.3 96.5  PLT 114* 110* 116*   Cardiac Enzymes:  Recent Labs Lab 03/20/14 0800  TROPONINI <0.30   BNP: BNP (last 3 results)  Recent Labs  02/07/14 2148 03/20/14 0800  PROBNP 325.5 762.3*   CBG: No results found for this basename: GLUCAP,  in the last 168 hours     Signed:  Jeralyn Bennett  [facility] 03/21/2014, 12:12 PM

## 2014-03-21 NOTE — Progress Notes (Signed)
NURSING PROGRESS NOTE  [name] [id number] Discharge Data: 03/21/2014 2:58 PM Attending Provider: No att. providers found WUJ:[id number] Felicity Coyer, MD     Benjamine Sprague to be D/C'd Home with home health per MD order.  Discussed with the patient the After Visit Summary and all questions fully answered. All IV's discontinued with no bleeding noted. All belongings returned to patient for patient to take home.   Last Vital Signs:  Blood pressure 128/58, pulse 60, temperature 98 F (36.7 C), temperature source Oral, resp. rate 22, height  (1.753 m), weight 108.999 kg (240 lb 4.8 oz), SpO2 97.00%.  Discharge Medication List   Medication List    STOP taking these medications       OVER THE COUNTER MEDICATION      TAKE these medications       allopurinol 300 MG tablet  Commonly known as:  ZYLOPRIM  Take 1 tablet (300 mg total) by mouth daily.     aspirin EC 81 MG tablet  Take 81 mg by mouth daily.     B-12 1000 MCG Caps  Take 1,000 mg by mouth daily.     buPROPion 150 MG 24 hr tablet  Commonly known as:  WELLBUTRIN XL  Take 1 tablet (150 mg total) by mouth every morning.     cefUROXime 500 MG tablet  Commonly known as:  CEFTIN  Take 1 tablet (500 mg total) by mouth 2 (two) times daily with a meal.     cholecalciferol 1000 UNITS tablet  Commonly known as:  VITAMIN D  Take 1,000 Units by mouth daily.     clopidogrel 75 MG tablet  Commonly known as:  PLAVIX  Take 1 tablet (75 mg total) by mouth daily.     Ferrous Gluconate 256 (28 FE) MG Tabs  Take 256 mg by mouth daily.     Fish Oil 1000 MG Caps  Take 1,000 mg by mouth daily.     furosemide 40 MG tablet  Commonly known as:  LASIX  Take 2 tablets (80 mg total) by mouth 2 (two) times daily.     LORazepam 1 MG tablet  Commonly known as:  ATIVAN  Take 1 tablet (1 mg total) by mouth every morning.     Magnesium 250 MG Tabs  Take 250 mg by mouth daily.     metoprolol 50 MG tablet  Commonly known as:   LOPRESSOR  Take 1 tablet (50 mg total) by mouth 2 (two) times daily.     mirtazapine 15 MG tablet  Commonly known as:  REMERON  Take 1 tablet (15 mg total) by mouth at bedtime.     MYRBETRIQ 50 MG Tb24 tablet  Generic drug:  mirabegron ER  Take 50 mg by mouth daily.     nitroGLYCERIN 0.4 MG SL tablet  Commonly known as:  NITROSTAT  Place 0.4 mg under the tongue every 5 (five) minutes as needed. For chest pain     oxyCODONE-acetaminophen 10-325 MG per tablet  Commonly known as:  PERCOCET  Take 1 tablet by mouth every 8 (eight) hours as needed for pain.     pantoprazole 40 MG tablet  Commonly known as:  PROTONIX  Take 1 tablet (40 mg total) by mouth 2 (two) times daily.     potassium chloride SA 20 MEQ tablet  Commonly known as:  K-DUR,KLOR-CON  Take 1 tablet (20 mEq total) by mouth 2 (two) times daily.     pregabalin 200 MG capsule  Commonly  known as:  LYRICA  Take 1 capsule (200 mg total) by mouth 2 (two) times daily. Take 1 capsule (200mg ) by mouth twice a day     rOPINIRole 4 MG tablet  Commonly known as:  REQUIP  Take 4 mg by mouth at bedtime.     tamsulosin 0.4 MG Caps capsule  Commonly known as:  FLOMAX  Take 1 capsule (0.4 mg total) by mouth daily.         Cathlyn Parsons, RN

## 2014-03-21 NOTE — Care Management Note (Signed)
    Page 1 of 1   03/22/2014     9:37:59 AM CARE MANAGEMENT NOTE 03/22/2014  Patient:  [name], [name]   Account Number:  [id number]  Date Initiated:  03/21/2014  Documentation initiated by:  [facility]  Subjective/Objective Assessment:   adm:  mild leukocytosis due to UTI, possible mild URI     Action/Plan:   discharge planning   Anticipated DC Date:  03/21/2014   Anticipated DC Plan:  [place]  CM consult      Choice offered to / List presented to:  C-1 Patient        [place] arranged  HH-1 RN  [place] agency  [place]   Status of service:  Completed, signed off Medicare Important Message given?   (If response is "NO", the following Medicare IM given date fields will be blank) Date Medicare IM given:   Medicare IM given by:   Date Additional Medicare IM given:   Additional Medicare IM given by:    Discharge Disposition:  [place]  Per UR Regulation:    If discussed at Long Length of Stay Meetings, dates discussed:    Comments:  03/22/14 09:30 CM called referral to [employer], [facility] who accepts pt for [facility] 9/1.  [facility] will render HHPT/RN.  No other CM needs were communicated.  Mariane Masters, [place], [place]. 03/21/14 14:41 CM met with pt in room to offer choice of home health agency.  Pt states anyone except [facility]. CM suggested [facility]; pt states he doesn't care as long as it isn't [facility].  Pt states he can't wait any longer and to please set him up with anyone who is not [facility]. [facility] unable to accept referral.   CM will arrange on Monday when agencies in his area are open to take referrals.  Mariane Masters, BSN, CM [phone].

## 2014-03-23 ENCOUNTER — Telehealth: Payer: Self-pay | Admitting: *Deleted

## 2014-03-23 NOTE — Telephone Encounter (Signed)
Transition Care Management Follow-up Telephone Call  How have you been since you were released from the hospital? No   Do you understand why you were in the hospital? YES  Do you understand the discharge instrcutions? YES Items Reviewed:  Medications reviewed: YES, but pt didn't;t see where they told him to stop taking over the counter meds. Will hold until 03/25/14 appt  Allergies reviewed: YES, no changes  Dietary changes reviewed: {YES  Referrals reviewed: YES, no referral needed   Functional Questionnaire:   Activities of Daily Living (ADLs):  Pt stated he is doing ok, getting around fine He states they are independent in the following: bathing and hygiene, walking States they require assistance with the following: none   Any transportation issues/concerns?: No Any patient concerns? Pt stated he doesn't have any concerns Confirmed importance and date/time of follow-up visits scheduled:  Advise pt to keep Hosp appt 03/25/14 with Dr. Alwyn Ren  Confirmed with patient if condition begins to worsen call PCP or go to the ER.  Patient was given the Call-a-Nurse line [phone]: YES

## 2014-03-25 ENCOUNTER — Ambulatory Visit (INDEPENDENT_AMBULATORY_CARE_PROVIDER_SITE_OTHER): Payer: Medicare Other | Admitting: Internal Medicine

## 2014-03-25 ENCOUNTER — Encounter: Payer: Self-pay | Admitting: Internal Medicine

## 2014-03-25 ENCOUNTER — Other Ambulatory Visit (INDEPENDENT_AMBULATORY_CARE_PROVIDER_SITE_OTHER): Payer: Medicare Other

## 2014-03-25 VITALS — BP 138/68 | HR 70 | Temp 97.8°F | Wt 242.4 lb

## 2014-03-25 DIAGNOSIS — R5383 Other fatigue: Secondary | ICD-10-CM

## 2014-03-25 DIAGNOSIS — R531 Weakness: Secondary | ICD-10-CM

## 2014-03-25 DIAGNOSIS — N184 Chronic kidney disease, stage 4 (severe): Secondary | ICD-10-CM

## 2014-03-25 DIAGNOSIS — M1A9XX1 Chronic gout, unspecified, with tophus (tophi): Secondary | ICD-10-CM

## 2014-03-25 DIAGNOSIS — M519 Unspecified thoracic, thoracolumbar and lumbosacral intervertebral disc disorder: Secondary | ICD-10-CM

## 2014-03-25 DIAGNOSIS — R5381 Other malaise: Secondary | ICD-10-CM

## 2014-03-25 LAB — URIC ACID: Uric Acid, Serum: 4.5 mg/dL (ref 4.0–7.8)

## 2014-03-25 LAB — FERRITIN: FERRITIN: 106.7 ng/mL (ref 22.0–322.0)

## 2014-03-25 NOTE — Patient Instructions (Signed)
Lorazepam & Mirtazapine among those which experts have documented to have a very  high risk of affecting  mental  alertness  & balance. This results in increased risk of falling with serious health or life threatening injury. Such medication should be taken as infrequently as possible and @  the lowest possible dose.It should not be taken with alcohol, sedatives  or other agents which have a similar  adverse risk potential. These risks are greater as we age as there is decreased ability of the liver and kidneys to metabolize and excrete the medication, resulting in   increased blood levels of the active ingredient.

## 2014-03-25 NOTE — Progress Notes (Signed)
Pre visit review using our clinic review tool, if applicable. No additional management support is needed unless otherwise documented below in the visit note. 

## 2014-03-25 NOTE — Progress Notes (Signed)
   Subjective:    Patient ID: [name], male    DOB: [birth year], 80 y.o.   MRN: [id number]  HPI   He was hospitalized 8/28-8/29 with possible urinary tract infection and failure to thrive. He states that he was unable to move and had jerking in his extremities. There was no definite seizure activity.  Blood gases reveal compensated hypercarbia.  The culture only revealed 10,000 colonies without a dominant organism but he was sent home on Ceftin.  He denies any active genitourinary symptoms at this time. Blood cultures were negative  White count was elevated at 15,000. Platelet count was 116,000. Creatinine rose as high as 1.65 but was 1.39 at the time of discharge. GFR was 47.  He continues to complain of chronic leg weakness and exertional dyspnea. He is extremely limited in his ambulation due to severe arthritic disease of the spine.  He is on multiple agents including several on the [facility] list. These include Ativan, Wellbutrin and Remeron.  He is also on high-dose allopurinol 300 mg daily  He is on oral iron and B12. His last hemoglobin was 14.8 on 8/29. His B12 level was 1373.      Review of Systems  He denies fever, chills, or sweats.  Dysuria, pyuria, hematuria, frequency, nocturia or polyuria are denied.     Objective:   Physical Exam   Positive or pertinent findings include: He has bilateral ptosis greater on the left than the right. Anisocoria is present. The nose is deformed and bulbous. He has extremely poor dentition Breath sounds are decreased; there is no increased work of breathing or abnormal breath sounds heard He has a grade 1 systolic murmur with radiation to the carotids. He has scattered scabbed lesions of the shins. He also has scattered bruising and scarring of the forearms. 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 Smaller residua of tophi present on the elbows. He has mixed PIP and DIP deformities of the hands with flexion  contractures Pedal pulses are decreased but equal He has extensive ecchymosis over the right lower quadrant.  General appearance :adequately nourished; in no distress. Eyes: No conjunctival inflammation or scleral icterus is present. Oral exam:  Lips and gums are healthy appearing.There is no oropharyngeal erythema or exudate noted.  Abdomen: bowel sounds normal, soft and non-tender without masses, organomegaly or hernias noted.  No guarding or rebound. No flank tenderness to percussion. Skin:Warm & dry.  Intact without suspicious lesions or rashes ; no jaundice or tenting Lymphatic: No lymphadenopathy is noted about the head, neck, axilla            Assessment & Plan:  #1 failure to thrive, multifactorial  #2 polypharmacy with increased fall risk.  Plan: Attempts will be made to wean as many medications as possible.

## 2014-03-26 LAB — CULTURE, BLOOD (ROUTINE X 2)
Culture: NO GROWTH
Culture: NO GROWTH

## 2014-04-19 DIAGNOSIS — R5381 Other malaise: Secondary | ICD-10-CM

## 2014-04-19 DIAGNOSIS — M1A9XX1 Chronic gout, unspecified, with tophus (tophi): Secondary | ICD-10-CM

## 2014-04-19 DIAGNOSIS — M519 Unspecified thoracic, thoracolumbar and lumbosacral intervertebral disc disorder: Secondary | ICD-10-CM

## 2014-04-19 DIAGNOSIS — N184 Chronic kidney disease, stage 4 (severe): Secondary | ICD-10-CM

## 2014-04-19 DIAGNOSIS — R5383 Other fatigue: Secondary | ICD-10-CM

## 2014-04-21 ENCOUNTER — Other Ambulatory Visit (INDEPENDENT_AMBULATORY_CARE_PROVIDER_SITE_OTHER): Payer: Medicare Other

## 2014-04-21 ENCOUNTER — Encounter: Payer: Self-pay | Admitting: Internal Medicine

## 2014-04-21 ENCOUNTER — Ambulatory Visit (INDEPENDENT_AMBULATORY_CARE_PROVIDER_SITE_OTHER)
Admission: RE | Admit: 2014-04-21 | Discharge: 2014-04-21 | Disposition: A | Discharge status: Home / Self Care | Payer: Medicare Other | Source: Ambulatory Visit | Attending: Internal Medicine | Admitting: Internal Medicine

## 2014-04-21 ENCOUNTER — Ambulatory Visit (INDEPENDENT_AMBULATORY_CARE_PROVIDER_SITE_OTHER): Payer: Medicare Other | Admitting: Internal Medicine

## 2014-04-21 VITALS — BP 148/70 | HR 66 | Temp 97.8°F | Resp 15 | Wt 241.0 lb

## 2014-04-21 DIAGNOSIS — G4733 Obstructive sleep apnea (adult) (pediatric): Secondary | ICD-10-CM

## 2014-04-21 DIAGNOSIS — N184 Chronic kidney disease, stage 4 (severe): Secondary | ICD-10-CM

## 2014-04-21 DIAGNOSIS — R0609 Other forms of dyspnea: Secondary | ICD-10-CM

## 2014-04-21 DIAGNOSIS — I2510 Atherosclerotic heart disease of native coronary artery without angina pectoris: Secondary | ICD-10-CM

## 2014-04-21 DIAGNOSIS — R0989 Other specified symptoms and signs involving the circulatory and respiratory systems: Secondary | ICD-10-CM

## 2014-04-21 DIAGNOSIS — G8929 Other chronic pain: Secondary | ICD-10-CM

## 2014-04-21 DIAGNOSIS — R1319 Other dysphagia: Secondary | ICD-10-CM

## 2014-04-21 DIAGNOSIS — R1013 Epigastric pain: Secondary | ICD-10-CM

## 2014-04-21 DIAGNOSIS — I2584 Coronary atherosclerosis due to calcified coronary lesion: Secondary | ICD-10-CM

## 2014-04-21 DIAGNOSIS — R0600 Dyspnea, unspecified: Secondary | ICD-10-CM

## 2014-04-21 LAB — LIPASE: Lipase: 47 U/L (ref 11.0–59.0)

## 2014-04-21 LAB — BASIC METABOLIC PANEL
BUN: 31 mg/dL — AB (ref 6–23)
CHLORIDE: 105 meq/L (ref 96–112)
CO2: 30 mEq/L (ref 19–32)
Calcium: 8.9 mg/dL (ref 8.4–10.5)
Creatinine, Ser: 1.9 mg/dL — ABNORMAL HIGH (ref 0.4–1.5)
GFR: 36.07 mL/min — ABNORMAL LOW (ref >60.00)
Glucose, Bld: 128 mg/dL — ABNORMAL HIGH (ref 70–99)
POTASSIUM: 4.5 meq/L (ref 3.5–5.1)
Sodium: 143 mEq/L (ref 135–145)

## 2014-04-21 LAB — CBC WITH DIFFERENTIAL/PLATELET
BASOS PCT: 0.4 % (ref 0.0–3.0)
Basophils Absolute: 0 10*3/uL (ref 0.0–0.1)
EOS PCT: 2.9 % (ref 0.0–5.0)
Eosinophils Absolute: 0.3 10*3/uL (ref 0.0–0.7)
HEMATOCRIT: 45.8 % (ref 39.0–52.0)
HEMOGLOBIN: 14.8 g/dL (ref 13.0–17.0)
LYMPHS ABS: 2 10*3/uL (ref 0.7–4.0)
Lymphocytes Relative: 22 % (ref 12.0–46.0)
MCHC: 32.4 g/dL (ref 30.0–36.0)
MCV: 93.4 fl (ref 78.0–100.0)
Monocytes Absolute: 0.7 10*3/uL (ref 0.1–1.0)
Monocytes Relative: 7.6 % (ref 3.0–12.0)
Neutro Abs: 6 10*3/uL (ref 1.4–7.7)
Neutrophils Relative %: 67.1 % (ref 43.0–77.0)
PLATELETS: 166 10*3/uL (ref 150.0–400.0)
RBC: 4.9 Mil/uL (ref 4.22–5.81)
RDW: 16.4 % — ABNORMAL HIGH (ref 11.5–15.5)
WBC: 8.9 10*3/uL (ref 4.0–10.5)

## 2014-04-21 LAB — HEPATIC FUNCTION PANEL
ALBUMIN: 3.7 g/dL (ref 3.5–5.2)
ALK PHOS: 109 U/L (ref 39–117)
ALT: 21 U/L (ref 0–53)
AST: 28 U/L (ref 0–37)
Bilirubin, Direct: 0 mg/dL (ref 0.0–0.3)
TOTAL PROTEIN: 7.5 g/dL (ref 6.0–8.3)
Total Bilirubin: 0.4 mg/dL (ref 0.2–1.2)

## 2014-04-21 LAB — AMYLASE: Amylase: 86 U/L (ref 27–131)

## 2014-04-21 NOTE — Progress Notes (Signed)
Pre visit review using our clinic review tool, if applicable. No additional management support is needed unless otherwise documented below in the visit note. 

## 2014-04-21 NOTE — Patient Instructions (Addendum)
Your next office appointment will be determined based upon review of your pending labs & x-rays. Those instructions will be transmitted to you by phone or by mail. Followup as needed for your acute issue. Please report any significant change in your symptoms.

## 2014-04-21 NOTE — Progress Notes (Signed)
   Subjective:    Patient ID: [name], male    DOB: [birth year], 80 y.o.   MRN: [id number]  HPI   He presents with a sharp burning epigastric and midabdominal pain which last hours. It is nonradiating and is severe as a level VIII. Chocolate milk did resolve the pain.  Despite taking Protonix he does have intermittent dysphagia. He denies significant dyspepsia  He denies taking nonsteroidals. He is on Plavix.  He describes dyspnea even at rest for several months, worse in the past month.  He has cough with white sputum production. He may have slight wheezing. He describes rhinorrhea with clear secretions. He is somewhat vague as to how compliant he is with his sleep apnea machine. Further questioning suggests he is totally non compliant with its employment.      Review of Systems   He denies melena, rectal bleeding, or small-caliber stools  He denies hemoptysis or purulent sputum.  His continue to take the generic Remeron despite recommendations that he discontinued because of somnolence     Objective:   Physical Exam  Pertinent positive findings include: He appears chronically ill & disheveled.He is very somnulent BMI is compatible with obesity.  He appears dyspneic at rest. Facial plethora is noted. There is bulbous deformity of his nose. Anisocoria is present. Poor dental hygiene is present with multiple missing teeth There is a blowing systolicmurmur; heart sounds are distant. Breath sounds are generally decreased. He has severe contractures of his hands. There is a large soft tissue mass @ the right knee appearing bursa-like. He has ecchymosis and scarring of the forearms and shins. There's a hyperpigmented scar over the right shin w/o cellulitis. Pedal pulses are markedly decreased.   Eyes: No conjunctival inflammation or scleral icterus is present. Oral exam:  Lips and gums are healthy appearing.There is no oropharyngeal erythema or exudate noted. Crowding  of oropharynx Heart:  Normal rate and regular rhythm. S1 and S2 normal without gallop, click, rub or other extra sounds   Lungs: no wheezes, rhonchi,rales ,or rubs present. Abdomen: bowel sounds normal, soft and non-tender without masses or organomegaly .Ventral hernia noted.  No guarding or rebound. No flank tenderness to percussion. Vascular : all pulses equal ; no bruits present. Skin:Warm & dry.  Intact without suspicious lesions or rashes ; no jaundice or tenting Lymphatic: No lymphadenopathy is noted about the head, neck, axilla           Assessment & Plan:  #1 abdominal pain #2 dyspnea #3 sleep apnea with non compliance with CPAP #4 dysphagia See orders & AVS

## 2014-04-22 ENCOUNTER — Other Ambulatory Visit: Payer: Self-pay | Admitting: Internal Medicine

## 2014-04-22 DIAGNOSIS — G4733 Obstructive sleep apnea (adult) (pediatric): Secondary | ICD-10-CM

## 2014-05-04 ENCOUNTER — Other Ambulatory Visit: Payer: Self-pay

## 2014-05-04 MED ORDER — LORAZEPAM 1 MG PO TABS: 1.0000 mg | ORAL_TABLET | Freq: Every morning | ORAL | Status: DC

## 2014-05-08 ENCOUNTER — Other Ambulatory Visit: Payer: Self-pay

## 2014-05-28 ENCOUNTER — Inpatient Hospital Stay (HOSPITAL_COMMUNITY)
Admission: EM | Admit: 2014-05-28 | Discharge: 2014-06-05 | DRG: 871 | Disposition: A | Discharge status: Skilled Nursing Facility | Payer: Medicare Other | Attending: Internal Medicine | Admitting: Internal Medicine

## 2014-05-28 ENCOUNTER — Inpatient Hospital Stay (HOSPITAL_COMMUNITY): Payer: Medicare Other

## 2014-05-28 ENCOUNTER — Emergency Department (HOSPITAL_COMMUNITY): Payer: Medicare Other

## 2014-05-28 ENCOUNTER — Ambulatory Visit: Payer: Medicare Other | Admitting: Internal Medicine

## 2014-05-28 ENCOUNTER — Encounter (HOSPITAL_COMMUNITY): Payer: Self-pay | Admitting: Emergency Medicine

## 2014-05-28 ENCOUNTER — Other Ambulatory Visit: Payer: Self-pay

## 2014-05-28 DIAGNOSIS — J9601 Acute respiratory failure with hypoxia: Secondary | ICD-10-CM | POA: Diagnosis present

## 2014-05-28 DIAGNOSIS — I252 Old myocardial infarction: Secondary | ICD-10-CM | POA: Diagnosis not present

## 2014-05-28 DIAGNOSIS — R319 Hematuria, unspecified: Secondary | ICD-10-CM | POA: Diagnosis present

## 2014-05-28 DIAGNOSIS — J9690 Respiratory failure, unspecified, unspecified whether with hypoxia or hypercapnia: Secondary | ICD-10-CM

## 2014-05-28 DIAGNOSIS — I129 Hypertensive chronic kidney disease with stage 1 through stage 4 chronic kidney disease, or unspecified chronic kidney disease: Secondary | ICD-10-CM | POA: Diagnosis present

## 2014-05-28 DIAGNOSIS — J129 Viral pneumonia, unspecified: Secondary | ICD-10-CM | POA: Diagnosis present

## 2014-05-28 DIAGNOSIS — Z7902 Long term (current) use of antithrombotics/antiplatelets: Secondary | ICD-10-CM | POA: Diagnosis not present

## 2014-05-28 DIAGNOSIS — D696 Thrombocytopenia, unspecified: Secondary | ICD-10-CM | POA: Diagnosis present

## 2014-05-28 DIAGNOSIS — N179 Acute kidney failure, unspecified: Secondary | ICD-10-CM | POA: Diagnosis present

## 2014-05-28 DIAGNOSIS — K219 Gastro-esophageal reflux disease without esophagitis: Secondary | ICD-10-CM | POA: Diagnosis present

## 2014-05-28 DIAGNOSIS — E1140 Type 2 diabetes mellitus with diabetic neuropathy, unspecified: Secondary | ICD-10-CM | POA: Diagnosis present

## 2014-05-28 DIAGNOSIS — F419 Anxiety disorder, unspecified: Secondary | ICD-10-CM | POA: Diagnosis present

## 2014-05-28 DIAGNOSIS — Z6832 Body mass index (BMI) 32.0-32.9, adult: Secondary | ICD-10-CM

## 2014-05-28 DIAGNOSIS — G4733 Obstructive sleep apnea (adult) (pediatric): Secondary | ICD-10-CM | POA: Diagnosis present

## 2014-05-28 DIAGNOSIS — Z801 Family history of malignant neoplasm of trachea, bronchus and lung: Secondary | ICD-10-CM | POA: Diagnosis not present

## 2014-05-28 DIAGNOSIS — J441 Chronic obstructive pulmonary disease with (acute) exacerbation: Secondary | ICD-10-CM | POA: Diagnosis present

## 2014-05-28 DIAGNOSIS — Z452 Encounter for adjustment and management of vascular access device: Secondary | ICD-10-CM

## 2014-05-28 DIAGNOSIS — A419 Sepsis, unspecified organism: Secondary | ICD-10-CM | POA: Diagnosis present

## 2014-05-28 DIAGNOSIS — Z955 Presence of coronary angioplasty implant and graft: Secondary | ICD-10-CM | POA: Diagnosis not present

## 2014-05-28 DIAGNOSIS — G2581 Restless legs syndrome: Secondary | ICD-10-CM | POA: Diagnosis present

## 2014-05-28 DIAGNOSIS — I2510 Atherosclerotic heart disease of native coronary artery without angina pectoris: Secondary | ICD-10-CM | POA: Diagnosis present

## 2014-05-28 DIAGNOSIS — J9602 Acute respiratory failure with hypercapnia: Secondary | ICD-10-CM | POA: Diagnosis present

## 2014-05-28 DIAGNOSIS — G9340 Encephalopathy, unspecified: Secondary | ICD-10-CM | POA: Diagnosis present

## 2014-05-28 DIAGNOSIS — Z803 Family history of malignant neoplasm of breast: Secondary | ICD-10-CM | POA: Diagnosis not present

## 2014-05-28 DIAGNOSIS — R001 Bradycardia, unspecified: Secondary | ICD-10-CM

## 2014-05-28 DIAGNOSIS — R0602 Shortness of breath: Secondary | ICD-10-CM | POA: Diagnosis present

## 2014-05-28 DIAGNOSIS — R4182 Altered mental status, unspecified: Secondary | ICD-10-CM

## 2014-05-28 DIAGNOSIS — E860 Dehydration: Secondary | ICD-10-CM | POA: Diagnosis present

## 2014-05-28 DIAGNOSIS — R0600 Dyspnea, unspecified: Secondary | ICD-10-CM

## 2014-05-28 DIAGNOSIS — I080 Rheumatic disorders of both mitral and aortic valves: Secondary | ICD-10-CM | POA: Diagnosis present

## 2014-05-28 DIAGNOSIS — I471 Supraventricular tachycardia: Secondary | ICD-10-CM | POA: Diagnosis present

## 2014-05-28 DIAGNOSIS — J811 Chronic pulmonary edema: Secondary | ICD-10-CM | POA: Diagnosis present

## 2014-05-28 DIAGNOSIS — I447 Left bundle-branch block, unspecified: Secondary | ICD-10-CM | POA: Diagnosis present

## 2014-05-28 DIAGNOSIS — R579 Shock, unspecified: Secondary | ICD-10-CM | POA: Diagnosis present

## 2014-05-28 DIAGNOSIS — E872 Acidosis: Secondary | ICD-10-CM | POA: Diagnosis present

## 2014-05-28 DIAGNOSIS — M109 Gout, unspecified: Secondary | ICD-10-CM | POA: Diagnosis present

## 2014-05-28 DIAGNOSIS — E8770 Fluid overload, unspecified: Secondary | ICD-10-CM | POA: Diagnosis present

## 2014-05-28 DIAGNOSIS — I69354 Hemiplegia and hemiparesis following cerebral infarction affecting left non-dominant side: Secondary | ICD-10-CM

## 2014-05-28 DIAGNOSIS — J9811 Atelectasis: Secondary | ICD-10-CM | POA: Diagnosis present

## 2014-05-28 DIAGNOSIS — R0902 Hypoxemia: Secondary | ICD-10-CM

## 2014-05-28 DIAGNOSIS — Z7982 Long term (current) use of aspirin: Secondary | ICD-10-CM

## 2014-05-28 DIAGNOSIS — R680 Hypothermia, not associated with low environmental temperature: Secondary | ICD-10-CM | POA: Diagnosis present

## 2014-05-28 DIAGNOSIS — N400 Enlarged prostate without lower urinary tract symptoms: Secondary | ICD-10-CM | POA: Diagnosis present

## 2014-05-28 DIAGNOSIS — M4800 Spinal stenosis, site unspecified: Secondary | ICD-10-CM | POA: Diagnosis present

## 2014-05-28 DIAGNOSIS — I739 Peripheral vascular disease, unspecified: Secondary | ICD-10-CM | POA: Diagnosis present

## 2014-05-28 DIAGNOSIS — E1159 Type 2 diabetes mellitus with other circulatory complications: Secondary | ICD-10-CM | POA: Diagnosis present

## 2014-05-28 DIAGNOSIS — K5900 Constipation, unspecified: Secondary | ICD-10-CM | POA: Diagnosis present

## 2014-05-28 DIAGNOSIS — I248 Other forms of acute ischemic heart disease: Secondary | ICD-10-CM | POA: Diagnosis present

## 2014-05-28 DIAGNOSIS — R140 Abdominal distension (gaseous): Secondary | ICD-10-CM

## 2014-05-28 DIAGNOSIS — E669 Obesity, unspecified: Secondary | ICD-10-CM | POA: Diagnosis present

## 2014-05-28 DIAGNOSIS — G8929 Other chronic pain: Secondary | ICD-10-CM | POA: Diagnosis present

## 2014-05-28 DIAGNOSIS — N184 Chronic kidney disease, stage 4 (severe): Secondary | ICD-10-CM | POA: Diagnosis present

## 2014-05-28 DIAGNOSIS — J9600 Acute respiratory failure, unspecified whether with hypoxia or hypercapnia: Secondary | ICD-10-CM

## 2014-05-28 DIAGNOSIS — N183 Chronic kidney disease, stage 3 unspecified: Secondary | ICD-10-CM | POA: Diagnosis present

## 2014-05-28 DIAGNOSIS — I959 Hypotension, unspecified: Secondary | ICD-10-CM

## 2014-05-28 DIAGNOSIS — Z833 Family history of diabetes mellitus: Secondary | ICD-10-CM

## 2014-05-28 DIAGNOSIS — Z4659 Encounter for fitting and adjustment of other gastrointestinal appliance and device: Secondary | ICD-10-CM

## 2014-05-28 DIAGNOSIS — E1151 Type 2 diabetes mellitus with diabetic peripheral angiopathy without gangrene: Secondary | ICD-10-CM

## 2014-05-28 DIAGNOSIS — R531 Weakness: Secondary | ICD-10-CM

## 2014-05-28 DIAGNOSIS — Z888 Allergy status to other drugs, medicaments and biological substances status: Secondary | ICD-10-CM | POA: Diagnosis not present

## 2014-05-28 DIAGNOSIS — Z8249 Family history of ischemic heart disease and other diseases of the circulatory system: Secondary | ICD-10-CM

## 2014-05-28 DIAGNOSIS — Z8042 Family history of malignant neoplasm of prostate: Secondary | ICD-10-CM

## 2014-05-28 LAB — CBC WITH DIFFERENTIAL/PLATELET
Basophils Absolute: 0 10*3/uL (ref 0.0–0.1)
Basophils Relative: 1 % (ref 0–1)
EOS ABS: 0.2 10*3/uL (ref 0.0–0.7)
EOS PCT: 4 % (ref 0–5)
HCT: 46.4 % (ref 39.0–52.0)
HEMOGLOBIN: 14.5 g/dL (ref 13.0–17.0)
LYMPHS PCT: 31 % (ref 12–46)
Lymphs Abs: 1.7 10*3/uL (ref 0.7–4.0)
MCH: 29.2 pg (ref 26.0–34.0)
MCHC: 31.3 g/dL (ref 30.0–36.0)
MCV: 93.5 fL (ref 78.0–100.0)
Monocytes Absolute: 0.9 10*3/uL (ref 0.1–1.0)
Monocytes Relative: 16 % — ABNORMAL HIGH (ref 3–12)
NEUTROS PCT: 49 % (ref 43–77)
Neutro Abs: 2.7 10*3/uL (ref 1.7–7.7)
PLATELETS: 103 10*3/uL — AB (ref 150–400)
RBC: 4.96 MIL/uL (ref 4.22–5.81)
RDW: 15.4 % (ref 11.5–15.5)
WBC: 5.6 10*3/uL (ref 4.0–10.5)

## 2014-05-28 LAB — BLOOD GAS, ARTERIAL
Acid-Base Excess: 0.5 mmol/L (ref 0.0–2.0)
BICARBONATE: 25.9 meq/L — AB (ref 20.0–24.0)
Drawn by: 41977
FIO2: 1 %
MECHVT: 600 mL
O2 Saturation: 99.3 %
PCO2 ART: 51.3 mmHg — AB (ref 35.0–45.0)
PEEP: 5 cmH2O
Patient temperature: 98.6
RATE: 20 resp/min
TCO2: 27.4 mmol/L (ref 0–100)
pH, Arterial: 7.323 — ABNORMAL LOW (ref 7.350–7.450)
pO2, Arterial: 239 mmHg — ABNORMAL HIGH (ref 80.0–100.0)

## 2014-05-28 LAB — URINALYSIS, ROUTINE W REFLEX MICROSCOPIC
Bilirubin Urine: NEGATIVE
Glucose, UA: NEGATIVE mg/dL
HGB URINE DIPSTICK: NEGATIVE
Ketones, ur: NEGATIVE mg/dL
LEUKOCYTES UA: NEGATIVE
NITRITE: NEGATIVE
Protein, ur: NEGATIVE mg/dL
SPECIFIC GRAVITY, URINE: 1.014 (ref 1.005–1.030)
UROBILINOGEN UA: 0.2 mg/dL (ref 0.0–1.0)
pH: 5 (ref 5.0–8.0)

## 2014-05-28 LAB — I-STAT ARTERIAL BLOOD GAS, ED
ACID-BASE EXCESS: 4 mmol/L — AB (ref 0.0–2.0)
Acid-Base Excess: 4 mmol/L — ABNORMAL HIGH (ref 0.0–2.0)
BICARBONATE: 36 meq/L — AB (ref 20.0–24.0)
Bicarbonate: 36.7 mEq/L — ABNORMAL HIGH (ref 20.0–24.0)
O2 SAT: 100 %
O2 Saturation: 95 %
PCO2 ART: 99.2 mmHg — AB (ref 35.0–45.0)
PO2 ART: 99 mmHg (ref 80.0–100.0)
Patient temperature: 94.2
TCO2: 40 mmol/L (ref 0–100)
TCO2: 39 mmol/L (ref 0–100)
pCO2 arterial: 84.3 mmHg (ref 35.0–45.0)
pH, Arterial: 7.177 — CL (ref 7.350–7.450)
pH, Arterial: 7.224 — ABNORMAL LOW (ref 7.350–7.450)
pO2, Arterial: 282 mmHg — ABNORMAL HIGH (ref 80.0–100.0)

## 2014-05-28 LAB — CARBOXYHEMOGLOBIN
Carboxyhemoglobin: 0.8 % (ref 0.5–1.5)
Methemoglobin: 1.1 % (ref 0.0–1.5)
O2 SAT: 61.2 %
TOTAL HEMOGLOBIN: 14.5 g/dL (ref 13.5–18.0)

## 2014-05-28 LAB — CBC
HEMATOCRIT: 49.8 % (ref 39.0–52.0)
Hemoglobin: 15.3 g/dL (ref 13.0–17.0)
MCH: 29.5 pg (ref 26.0–34.0)
MCHC: 30.7 g/dL (ref 30.0–36.0)
MCV: 96 fL (ref 78.0–100.0)
Platelets: 101 10*3/uL — ABNORMAL LOW (ref 150–400)
RBC: 5.19 MIL/uL (ref 4.22–5.81)
RDW: 15.3 % (ref 11.5–15.5)
WBC: 8.7 10*3/uL (ref 4.0–10.5)

## 2014-05-28 LAB — BASIC METABOLIC PANEL
Anion gap: 12 (ref 5–15)
BUN: 42 mg/dL — AB (ref 6–23)
CO2: 33 meq/L — AB (ref 19–32)
Calcium: 9 mg/dL (ref 8.4–10.5)
Chloride: 96 mEq/L (ref 96–112)
Creatinine, Ser: 1.74 mg/dL — ABNORMAL HIGH (ref 0.50–1.35)
GFR calc Af Amer: 41 mL/min — ABNORMAL LOW (ref >90)
GFR calc non Af Amer: 36 mL/min — ABNORMAL LOW (ref >90)
Glucose, Bld: 118 mg/dL — ABNORMAL HIGH (ref 70–99)
POTASSIUM: 4.1 meq/L (ref 3.7–5.3)
SODIUM: 141 meq/L (ref 137–147)

## 2014-05-28 LAB — MAGNESIUM: Magnesium: 2.3 mg/dL (ref 1.5–2.5)

## 2014-05-28 LAB — CREATININE, SERUM
Creatinine, Ser: 1.93 mg/dL — ABNORMAL HIGH (ref 0.50–1.35)
GFR calc non Af Amer: 31 mL/min — ABNORMAL LOW (ref >90)
GFR, EST AFRICAN AMERICAN: 36 mL/min — AB (ref >90)

## 2014-05-28 LAB — I-STAT CG4 LACTIC ACID, ED
Lactic Acid, Venous: 1.63 mmol/L (ref 0.5–2.2)
Lactic Acid, Venous: 0.88 mmol/L (ref 0.5–2.2)

## 2014-05-28 LAB — I-STAT TROPONIN, ED: Troponin i, poc: 0.02 ng/mL (ref 0.00–0.08)

## 2014-05-28 LAB — MRSA PCR SCREENING: MRSA BY PCR: NEGATIVE

## 2014-05-28 LAB — GLUCOSE, CAPILLARY: Glucose-Capillary: 247 mg/dL — ABNORMAL HIGH (ref 70–99)

## 2014-05-28 LAB — STREP PNEUMONIAE URINARY ANTIGEN: Strep Pneumo Urinary Antigen: NEGATIVE

## 2014-05-28 LAB — PRO B NATRIURETIC PEPTIDE: Pro B Natriuretic peptide (BNP): 477.3 pg/mL — ABNORMAL HIGH (ref 0–450)

## 2014-05-28 MED ORDER — HEPARIN SODIUM (PORCINE) 5000 UNIT/ML IJ SOLN
5000.0000 [IU] | Freq: Three times a day (TID) | INTRAMUSCULAR | Status: DC
  Administered 2014-05-28 – 2014-05-29 (×2): 5000 [IU] via SUBCUTANEOUS
  Filled 2014-05-28 (×3): qty 1

## 2014-05-28 MED ORDER — SODIUM CHLORIDE 0.9 % IV SOLN
1.0000 g | Freq: Once | INTRAVENOUS | Status: AC
  Administered 2014-05-28: 1 g via INTRAVENOUS
  Filled 2014-05-28: qty 10

## 2014-05-28 MED ORDER — ONDANSETRON HCL 4 MG/2ML IJ SOLN
4.0000 mg | Freq: Once | INTRAMUSCULAR | Status: AC
  Administered 2014-05-28: 4 mg via INTRAVENOUS
  Filled 2014-05-28: qty 2

## 2014-05-28 MED ORDER — ETOMIDATE 2 MG/ML IV SOLN
20.0000 mg | Freq: Once | INTRAVENOUS | Status: AC
  Administered 2014-05-28: 20 mg via INTRAVENOUS

## 2014-05-28 MED ORDER — FENTANYL BOLUS VIA INFUSION
25.0000 ug | INTRAVENOUS | Status: DC | PRN
  Administered 2014-05-30: 25 ug via INTRAVENOUS
  Filled 2014-05-28: qty 50

## 2014-05-28 MED ORDER — MIDAZOLAM HCL 2 MG/2ML IJ SOLN
2.0000 mg | Freq: Once | INTRAMUSCULAR | Status: AC
  Administered 2014-05-28: 2 mg via INTRAVENOUS

## 2014-05-28 MED ORDER — PANTOPRAZOLE SODIUM 40 MG IV SOLR
40.0000 mg | INTRAVENOUS | Status: DC
  Administered 2014-05-28 – 2014-05-30 (×3): 40 mg via INTRAVENOUS
  Filled 2014-05-28 (×3): qty 40

## 2014-05-28 MED ORDER — CEFTRIAXONE SODIUM 2 G IJ SOLR
2.0000 g | Freq: Once | INTRAMUSCULAR | Status: AC
  Administered 2014-05-28: 2 g via INTRAVENOUS
  Filled 2014-05-28: qty 2

## 2014-05-28 MED ORDER — ATROPINE SULFATE 0.1 MG/ML IJ SOLN
INTRAMUSCULAR | Status: AC
Start: 2014-05-28 — End: 2014-05-29
  Filled 2014-05-28: qty 10

## 2014-05-28 MED ORDER — LIDOCAINE HCL (CARDIAC) 20 MG/ML IV SOLN
INTRAVENOUS | Status: AC
  Filled 2014-05-28: qty 5

## 2014-05-28 MED ORDER — SODIUM CHLORIDE 0.9 % IV SOLN: 250.0000 mL | INTRAVENOUS | Status: DC | PRN

## 2014-05-28 MED ORDER — SUCCINYLCHOLINE CHLORIDE 20 MG/ML IJ SOLN
150.0000 mg | Freq: Once | INTRAMUSCULAR | Status: AC
  Administered 2014-05-28: 150 mg via INTRAVENOUS

## 2014-05-28 MED ORDER — ETOMIDATE 2 MG/ML IV SOLN
INTRAVENOUS | Status: AC
  Filled 2014-05-28: qty 20

## 2014-05-28 MED ORDER — LORAZEPAM 2 MG/ML IJ SOLN
2.0000 mg | Freq: Once | INTRAMUSCULAR | Status: AC
  Administered 2014-05-28: 2 mg via INTRAVENOUS

## 2014-05-28 MED ORDER — FENTANYL CITRATE 0.05 MG/ML IJ SOLN
100.0000 ug | Freq: Once | INTRAMUSCULAR | Status: AC
  Administered 2014-05-28: 100 ug via INTRAVENOUS
  Filled 2014-05-28: qty 2

## 2014-05-28 MED ORDER — MIDAZOLAM HCL 2 MG/2ML IJ SOLN
INTRAMUSCULAR | Status: AC
  Filled 2014-05-28: qty 2

## 2014-05-28 MED ORDER — LORAZEPAM 2 MG/ML IJ SOLN
INTRAMUSCULAR | Status: AC
  Filled 2014-05-28: qty 1

## 2014-05-28 MED ORDER — SODIUM CHLORIDE 0.9 % IV SOLN
INTRAVENOUS | Status: DC
  Administered 2014-05-28: 19:00:00 via INTRAVENOUS

## 2014-05-28 MED ORDER — DOPAMINE-DEXTROSE 3.2-5 MG/ML-% IV SOLN
0.0000 ug/kg/min | INTRAVENOUS | Status: DC
  Administered 2014-05-28: 5 ug/kg/min via INTRAVENOUS
  Filled 2014-05-28: qty 250

## 2014-05-28 MED ORDER — SODIUM CHLORIDE 0.9 % IV SOLN
0.0000 ug/h | INTRAVENOUS | Status: DC
  Administered 2014-05-28: 50 ug/h via INTRAVENOUS
  Filled 2014-05-28 (×2): qty 50

## 2014-05-28 MED ORDER — FENTANYL CITRATE 0.05 MG/ML IJ SOLN
INTRAMUSCULAR | Status: AC
  Administered 2014-05-28: 100 ug
  Filled 2014-05-28: qty 2

## 2014-05-28 MED ORDER — ROCURONIUM BROMIDE 50 MG/5ML IV SOLN
INTRAVENOUS | Status: AC
  Filled 2014-05-28: qty 2

## 2014-05-28 MED ORDER — LORAZEPAM 2 MG/ML IJ SOLN
1.0000 mg | Freq: Once | INTRAMUSCULAR | Status: AC
  Administered 2014-05-28: 1 mg via INTRAVENOUS
  Filled 2014-05-28: qty 1

## 2014-05-28 MED ORDER — SODIUM CHLORIDE 0.9 % IV BOLUS (SEPSIS)
500.0000 mL | Freq: Once | INTRAVENOUS | Status: AC
  Administered 2014-05-28: 500 mL via INTRAVENOUS

## 2014-05-28 MED ORDER — SUCCINYLCHOLINE CHLORIDE 20 MG/ML IJ SOLN
INTRAMUSCULAR | Status: AC
  Filled 2014-05-28: qty 1

## 2014-05-28 MED ORDER — NOREPINEPHRINE BITARTRATE 1 MG/ML IV SOLN
2.0000 ug/min | INTRAVENOUS | Status: DC
  Administered 2014-05-28: 2 ug/min via INTRAVENOUS
  Administered 2014-05-29: 5 ug/min via INTRAVENOUS
  Filled 2014-05-28 (×3): qty 4

## 2014-05-28 NOTE — ED Notes (Signed)
Trey Paula, RT aware of set up for intubation
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
## 2014-05-28 NOTE — ED Notes (Signed)
Respiratory at bedside; Dr. Effie Shy aware

## 2014-05-28 NOTE — ED Notes (Signed)
Trey Paula, RT at bedside repositioning Bi-pap mask

## 2014-05-28 NOTE — ED Notes (Signed)
Dr.Wentz at bedside for intubation

## 2014-05-28 NOTE — ED Notes (Signed)
Daughter took home, white t-shirt, flannel pajama pants, and black underwear.

## 2014-05-28 NOTE — Procedures (Signed)
Central Venous Catheter Insertion Procedure Note [name] [id number] September 26, 1932  Procedure: Insertion of Central Venous Catheter Indications: Assessment of intravascular volume, Drug and/or fluid administration and Frequent blood sampling  Procedure Details Consent: Risks of procedure as well as the alternatives and risks of each were explained to the (patient/caregiver).  Consent for procedure obtained. Time Out: Verified patient identification, verified procedure, site/side was marked, verified correct patient position, special equipment/implants available, medications/allergies/relevent history reviewed, required imaging and test results available.  Performed  Maximum sterile technique was used including antiseptics, cap, gloves, gown, hand hygiene, mask and sheet. Skin prep: Chlorhexidine; local anesthetic administered A antimicrobial bonded/coated triple lumen catheter was placed in the left internal jugular vein using the Seldinger technique. Ultrasound guidance used.Yes.   Catheter placed to 20 cm. Blood aspirated via all 3 ports and then flushed x 3. Line sutured x 2 and dressing applied.  Evaluation Blood flow good Complications: No apparent complications Patient did tolerate procedure well. Chest X-ray ordered to verify placement.  CXR: pending.  Joneen Roach, ACNP [place] [facility] Pager [phone] or [phone]   Levy Pupa, MD, PhD 05/29/2014, 6:25 AM [facility] [phone] or if no answer [phone]

## 2014-05-28 NOTE — ED Notes (Signed)
[name]  (daughter), phone number: [phone] or [phone]. Please call with updates.

## 2014-05-28 NOTE — ED Notes (Signed)
Trey Paula, RT at bedside repeating second ABG

## 2014-05-28 NOTE — ED Notes (Signed)
Ativan given per order for agitation; Pt resting at this time. VSS

## 2014-05-28 NOTE — Progress Notes (Signed)
eLink Physician-Brief Progress Note Patient Name: [name] DOB: 1933/02/19 MRN: [id number]   Date of Service  05/28/2014  HPI/Events of Note  Hypotensive, bradycardic, hypothermic.  eICU Interventions  Will add dopamine.  Will add bear hugger.     Intervention Category Major Interventions: Other:  [name] 05/28/2014, 8:42 PM

## 2014-05-28 NOTE — ED Notes (Signed)
Dr.Wentz at bedside speaking to pts son, daughter, son-in-law and daughter-in-law

## 2014-05-28 NOTE — ED Notes (Addendum)
Pt more confused, not following commands, pt fighting with bi-pap mask. Dr.Wentz called to bedside

## 2014-05-28 NOTE — ED Notes (Addendum)
Pt to ED from home c/o increased shortness of breath, urinary retention, and confusion. Daughter reports pt has been talking to himself and not understanding where he is at times. Pt at baseline has "some shortness of breath," today pt has become more short of breath. Diminished lung sounds noted, with expiratory wheezing on LLL. Pt also reports not taking Magnesium x 4-5 days because he has been out of it. Pt noted to have numerous EKG changes (Dr.Wentz aware, repeat EKGs given.) Pt also c/o urinary retention with past history of the same. Pt is A&O x 4 at this time
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
## 2014-05-28 NOTE — ED Notes (Addendum)
Pt placed on 40% venturi mask for SpO2 sats at 72%; SpO2 at 81% on venturi mask; Pt placed on 15L NRB sats at 96%

## 2014-05-28 NOTE — ED Notes (Signed)
Dr.Schmitz from family medicine at bedside

## 2014-05-28 NOTE — ED Notes (Signed)
Bi-pap mask suction; saliva noted in mask. Pt's mouth suctioned without difficulty

## 2014-05-28 NOTE — ED Notes (Signed)
Pt's SpO2 at 86% on room air, transitioning to bi-pap

## 2014-05-28 NOTE — ED Notes (Signed)
Saliva suctioned from patient's mask

## 2014-05-28 NOTE — ED Provider Notes (Signed)
CSN: [id number]     Arrival date & time 05/28/14  1454 History   First MD Initiated Contact with Patient 05/28/14 1507     Chief Complaint  Patient presents with  . Shortness of Breath     (Consider location/radiation/quality/duration/timing/severity/associated sxs/prior Treatment) HPI   [name] is a 80 y.o. male brought in by family member by EMS, because he was more agitated and short of breath overnight last night.  The patient typically lives alone.  His other recent history, is not clear.  The patient cannot contribute to history.  His daughter feels that he has had decreased urinary output.  Patient told the nurse that he was not taking his magnesium for several days.  Level V Caveat- altered mental status   Past Medical History  Diagnosis Date  . Gout, unspecified     severe dz, "treatment done that last 15 years"  . OBESITY   . RESTLESS LEG SYNDROME   . DETACHMENT, RETINAL NOS 1994  . CATARACT NOS 2008  . HYPERTENSION   . CORONARY ARTERY DISEASE   . PERICARDITIS, ACUTE NEC 2007    MSSA, s/p pericardial window  . GERD   . DEGENERATIVE JOINT DISEASE   . CAROTID ENDARTERECTOMY, LEFT, HX OF   . OBSTRUCTIVE SLEEP APNEA     CPAP qhs  . TIA (transient ischemic attack) 2005  . BPH (benign prostatic hypertrophy)     "minor"  . Lumbar spinal stenosis   . Carotid artery occlusion   . Unspecified venous (peripheral) insufficiency   . Stroke May 2003  . MYOCARDIAL INFARCTION 2004, 03/2010    "minor"  . Anxiety   . Depression   . Shortness of breath    Past Surgical History  Procedure Laterality Date  . Pericardial window  2007  . Left arm  2008    shoulder  . Right arm  1970's    shoulder  . Cataract extraction      Left side x's 2 and right  . Retinal detachment surgery      left side  . Carotid endarterectomy Left 12-06-05    cea  . Coronary angioplasty  01/2009    x 1 stent  . Colonoscopy    . Carpal tunnel release Bilateral   . Back surgery  2013     removed bone spurs  . Inguinal hernia repair Left 10/01/2013    Procedure: HERNIA REPAIR INGUINAL ADULT;  Surgeon: Axel Filler, MD;  Location: [facility];  Service: General;  Laterality: Left;  . Insertion of mesh Left 10/01/2013    Procedure: INSERTION OF MESH;  Surgeon: Axel Filler, MD;  Location: [facility];  Service: General;  Laterality: Left;  . Hernia repair    . Finger amputation  2015    JUST TO FIRST JOINT RIGHT HAND  LAST FINGER   Family History  Problem Relation Age of Onset  . Heart disease Mother   . Diabetes Father   . Heart disease Father   . Breast cancer Sister   . Cancer Sister   . Prostate cancer Brother   . Lung cancer Brother   . Cancer Brother   . Diabetes Daughter   . Diabetes Son    History  Substance Use Topics  . Smoking status: Never Smoker   . Smokeless tobacco: Never Used  . Alcohol Use: No    Review of Systems  Unable to perform ROS     Allergies  Atorvastatin  Home Medications   Prior to  Admission medications   Medication Sig Start Date End Date Taking? Authorizing Provider  allopurinol (ZYLOPRIM) 300 MG tablet Take 300 mg by mouth every morning. 03/03/14  Yes Historical Provider, MD  aspirin EC 81 MG tablet Take 81 mg by mouth daily.   Yes Historical Provider, MD  buPROPion (WELLBUTRIN XL) 150 MG 24 hr tablet Take 1 tablet (150 mg total) by mouth every morning. 10/08/13  Yes Newt Lukes, MD  cholecalciferol (VITAMIN D) 1000 UNITS tablet Take 1,000 Units by mouth daily.   Yes Historical Provider, MD  clopidogrel (PLAVIX) 75 MG tablet Take 1 tablet (75 mg total) by mouth daily. 10/08/13  Yes Newt Lukes, MD  dextromethorphan (DELSYM) 30 MG/5ML liquid Take 15 mLs by mouth as needed for cough.   Yes Historical Provider, MD  furosemide (LASIX) 40 MG tablet Take 2 tablets (80 mg total) by mouth 2 (two) times daily. 10/08/13  Yes Newt Lukes, MD  ibuprofen (ADVIL,MOTRIN) 200 MG tablet Take 400 mg by mouth every 6 (six) hours  as needed for moderate pain.   Yes Historical Provider, MD  LORazepam (ATIVAN) 1 MG tablet Take 1 tablet (1 mg total) by mouth every morning. 05/04/14  Yes Newt Lukes, MD  LYRICA 200 MG capsule Take 200 mg by mouth 2 (two) times daily. 03/05/14  Yes Historical Provider, MD  Magnesium 250 MG TABS Take 250 mg by mouth daily.    Yes Historical Provider, MD  metoprolol (LOPRESSOR) 50 MG tablet Take 1 tablet (50 mg total) by mouth 2 (two) times daily. 10/08/13  Yes Newt Lukes, MD  mirabegron ER (MYRBETRIQ) 50 MG TB24 tablet Take 50 mg by mouth daily.   Yes Historical Provider, MD  mirtazapine (REMERON) 15 MG tablet Take 1 tablet (15 mg total) by mouth at bedtime. 10/08/13  Yes Newt Lukes, MD  Omega-3 Fatty Acids (FISH OIL) 1000 MG CAPS Take 1,000 mg by mouth daily.   Yes Historical Provider, MD  oxyCODONE-acetaminophen (PERCOCET) 10-325 MG per tablet Take 1 tablet by mouth every 8 (eight) hours as needed for pain. 01/19/14  Yes Newt Lukes, MD  pantoprazole (PROTONIX) 40 MG tablet Take 1 tablet (40 mg total) by mouth 2 (two) times daily. 02/10/14  Yes Zannie Cove, MD  phenol (CHLORASEPTIC) 1.4 % LIQD Use as directed 1 spray in the mouth or throat as needed for throat irritation / pain.   Yes Historical Provider, MD  potassium chloride SA (K-DUR,KLOR-CON) 20 MEQ tablet Take 1 tablet (20 mEq total) by mouth 2 (two) times daily. 10/08/13  Yes Newt Lukes, MD  rOPINIRole (REQUIP) 4 MG tablet Take 4 mg by mouth at bedtime.   Yes Historical Provider, MD  tamsulosin (FLOMAX) 0.4 MG CAPS capsule Take 1 capsule (0.4 mg total) by mouth daily. 10/08/13  Yes Newt Lukes, MD  cefUROXime (CEFTIN) 500 MG tablet Take 1 tablet (500 mg total) by mouth 2 (two) times daily with a meal. 03/21/14   Jeralyn Bennett, MD  Ferrous Gluconate 256 (28 FE) MG TABS Take 256 mg by mouth daily.    Historical Provider, MD  nitroGLYCERIN (NITROSTAT) 0.4 MG SL tablet Place 0.4 mg under the tongue  every 5 (five) minutes as needed. For chest pain    Historical Provider, MD   BP 61/34 mmHg  Pulse 36  Temp(Src) 95 F (35 C) (Rectal)  Resp 20  Ht 5\' 11"  (1.803 m)  Wt 250 lb 10.6 oz (113.7 kg)  BMI 34.98  kg/m2  SpO2 93% Physical Exam  Constitutional: He is oriented to person, place, and time. He appears well-developed.  Obese, elderly  HENT:  Head: Normocephalic and atraumatic.  Right Ear: External ear normal.  Left Ear: External ear normal.  Eyes: Conjunctivae and EOM are normal. Pupils are equal, round, and reactive to light.  Neck: Normal range of motion and phonation normal. Neck supple.  Cardiovascular: Normal rate, regular rhythm and normal heart sounds.   Pulmonary/Chest: Effort normal and breath sounds normal. He exhibits no bony tenderness.  Abdominal: Soft.  Decreased air movement, bilaterally.  Tachypnea is present.  No audible wheezes.  Musculoskeletal: Normal range of motion. He exhibits edema (marked edema extending to the top of both legs, and the abdominal wall.). He exhibits no tenderness.  Neurological: He is alert and oriented to person, place, and time. No cranial nerve deficit or sensory deficit. He exhibits normal muscle tone. Coordination normal.  Skin: Skin is warm, dry and intact.  Scattered excoriations and abrasions of the lower legs bilaterally and forearms.  Psychiatric: He has a normal mood and affect. His behavior is normal. Judgment and thought content normal.  Nursing note and vitals reviewed.   ED Course  Procedures (including critical care time)  initial evaluation is consistent with sepsis associated with hypotension, tachypnea, tachycardia and hypoxia.pelvic antibiotics ordered for respiratory infection.  Oxygenation initially with nasal cannula, he continued to be hypoxic.  Stepwise increment of oxygen supplementation with Ventimask, followed by facemask for persistent hypoxia.  Patient's respiratory status worsened, with hypoxia, and  confusion indicating respiratory failure.  Arterial blood gas obtained and is consistent with hypercapnic respiratory failure, acute.  BiPAP, given for urgent treatment.  He required Ativan sedation for tolerance of BiPAP.  The Ativan was helpful. Patient failed BiPAP treatment with worsening of his respiratory acidosis, and elevation of PCO2.  He was therefore intubated, by me.  Labs Review Labs Reviewed  CBC WITH DIFFERENTIAL - Abnormal; Notable for the following:    Platelets 103 (*)    Monocytes Relative 16 (*)    All other components within normal limits  BASIC METABOLIC PANEL - Abnormal; Notable for the following:    CO2 33 (*)    Glucose, Bld 118 (*)    BUN 42 (*)    Creatinine, Ser 1.74 (*)    GFR calc non Af Amer 36 (*)    GFR calc Af Amer 41 (*)    All other components within normal limits  PRO B NATRIURETIC PEPTIDE - Abnormal; Notable for the following:    Pro B Natriuretic peptide (BNP) 477.3 (*)    All other components within normal limits  CBC - Abnormal; Notable for the following:    Platelets 101 (*)    All other components within normal limits  CREATININE, SERUM - Abnormal; Notable for the following:    Creatinine, Ser 1.93 (*)    GFR calc non Af Amer 31 (*)    GFR calc Af Amer 36 (*)    All other components within normal limits  GLUCOSE, CAPILLARY - Abnormal; Notable for the following:    Glucose-Capillary 247 (*)    All other components within normal limits  BLOOD GAS, ARTERIAL - Abnormal; Notable for the following:    pH, Arterial 7.323 (*)    pCO2 arterial 51.3 (*)    pO2, Arterial 239.0 (*)    Bicarbonate 25.9 (*)    All other components within normal limits  I-STAT ARTERIAL BLOOD GAS, ED - Abnormal;  Notable for the following:    pH, Arterial 7.177 (*)    pCO2 arterial 99.2 (*)    Bicarbonate 36.7 (*)    Acid-Base Excess 4.0 (*)    All other components within normal limits  I-STAT ARTERIAL BLOOD GAS, ED - Abnormal; Notable for the following:    pH,  Arterial 7.224 (*)    pCO2 arterial 84.3 (*)    pO2, Arterial 282.0 (*)    Bicarbonate 36.0 (*)    Acid-Base Excess 4.0 (*)    All other components within normal limits  MRSA PCR SCREENING  CULTURE, BLOOD (ROUTINE X 2)  CULTURE, BLOOD (ROUTINE X 2)  URINE CULTURE  CULTURE, RESPIRATORY (NON-EXPECTORATED)  URINALYSIS, ROUTINE W REFLEX MICROSCOPIC  STREP PNEUMONIAE URINARY ANTIGEN  MAGNESIUM  CARBOXYHEMOGLOBIN  CBC  BASIC METABOLIC PANEL  MAGNESIUM  PHOSPHORUS  LEGIONELLA ANTIGEN, URINE  PROCALCITONIN  PROCALCITONIN  TROPONIN I  I-STAT TROPOININ, ED  I-STAT CG4 LACTIC ACID, ED  I-STAT CG4 LACTIC ACID, ED   Component     Latest Ref Rng 03/21/2014 04/21/2014 05/28/2014            Glucose     70 - 99 mg/dL 696 (H) 295 (H) 284 (H)  BUN     6 - 23 mg/dL 27 (H) 31 (H) 42 (H)  Sodium     137 - 147 mEq/L 139 143 141  Potassium     3.7 - 5.3 mEq/L 4.6 4.5 4.1  Chloride     96 - 112 mEq/L 100 105 96  CO2     19 - 32 mEq/L 28 30 33 (H)  Calcium     8.4 - 10.5 mg/dL 9.1 8.9 9.0  Creatinine     0.50 - 1.35 mg/dL 1.32 (H) 1.9 (H) 4.40 (H)  GFR calc non Af Amer     >90 mL/min 47 (L)  36 (L)  GFR calc Af Amer     >90 mL/min 54 (L)  41 (L)  Anion gap     5 - 15 11  12    17:20- days.  Discussed with pulmonary critical care medicine, Dr. Craige Cotta, will see for admission. Pt., is Full Code.  INTUBATION Performed by: Flint Melter  Required items: required blood products, implants, devices, and special equipment available Patient identity confirmed: provided demographic data and hospital-assigned identification number Time out: Immediately prior to procedure a "time out" was called to verify the correct patient, procedure, equipment, support staff and site/side marked as required.  Indications: respiratory failure  Intubation method:Glidescope Laryngoscopy   Preoxygenation: 100 % BVM  Sedatives: 20 mg Etomidate Paralytic: 150 mgSuccinylcholine  Tube Size: 8.0  cuffed  Post-procedure assessment: chest rise and ETCO2 monitor Breath sounds: equal and absent over the epigastrium Tube secured with: ETT holder Chest x-ray interpreted by radiologist and me.  Chest x-ray findings: endotracheal tube in appropriate position  Patient tolerated the procedure well with no immediate complications.    CRITICAL CARE Performed by: Flint Melter Total critical care time: 75 minutes Critical care time was exclusive of separately billable procedures and treating other patients. Critical care was necessary to treat or prevent imminent or life-threatening deterioration. Critical care was time spent personally by me on the following activities: development of treatment plan with patient and/or surrogate as well as nursing, discussions with consultants, evaluation of patient's response to treatment, examination of patient, obtaining history from patient or surrogate, ordering and performing treatments and interventions, ordering and review of laboratory studies, ordering and review  of radiographic studies, pulse oximetry and re-evaluation of patient's condition.   Imaging Review Dg Chest Port 1 View  05/28/2014   CLINICAL DATA:  Central line placement  EXAM: PORTABLE CHEST - 1 VIEW  COMPARISON:  05/28/2014  FINDINGS: Endotracheal tube tip measures 3.8 cm above the carinal. Enteric tube tip is off the field of view but below the left hemidiaphragm. Interval placement of a left central venous catheter. The tip is directed horizontally over the mid SVC region consistent with location of the junction of the brachiocephalic and superior vena cava. No pneumothorax. Shallow inspiration. Cardiac enlargement. Fluid in the right major fissure. Patchy infiltration in the left perihilar region. No blunting of costophrenic angles.  IMPRESSION: Left central venous catheter tip directed horizontally in the region of the junction of the brachiocephalic vein and SVC. No pneumothorax.  Patchy perihilar infiltration on the left. Shallow inspiration.   Electronically Signed   By: Burman NievesWilliam  Stevens M.D.   On: 05/28/2014 22:56   Dg Chest Portable 1 View  05/28/2014   CLINICAL DATA:  Shortness of breath.  Endotracheal tube placement.  EXAM: PORTABLE CHEST - 1 VIEW 7:54 p.m.  COMPARISON:  05/28/2014 at 3:51 p.m. and 04/21/2014  FINDINGS: Endotracheal tube has been inserted and the tip is in good position 2.8 cm above the carina. Pulmonary edema has resolved on the right and is much improved on the left. There is slight atelectasis in the left midzone the left base medially. Small amount of fluid along the right minor fissure. Tremors thoracic aorta.  IMPRESSION: Endotracheal tube in good position.  Improving pulmonary edema.  Slight atelectasis on the left.   Electronically Signed   By: Geanie CooleyJim  Maxwell M.D.   On: 05/28/2014 20:39   Dg Chest Port 1 View  05/28/2014   CLINICAL DATA:  Shortness of breath for 1 day  EXAM: PORTABLE CHEST - 1 VIEW  COMPARISON:  04/21/2014  FINDINGS: Diminished exam detail secondary to patient's body habitus, portable technique and low lung volumes. Cardiac enlargement noted. The lung volumes are low. Mild pulmonary edema is identified bilaterally. No airspace consolidation.  IMPRESSION: 1. Suboptimal exam. 2. Cardiac enlargement and edema.   Electronically Signed   By: Signa Kellaylor  Stroud M.D.   On: 05/28/2014 16:03   Dg Abd Portable 1v  05/28/2014   CLINICAL DATA:  Evaluate nasogastric tube placement.Encounter for orogastric (OG) tube placement Z46.59 (ICD-10-CM)  EXAM: PORTABLE ABDOMEN - 1 VIEW  COMPARISON:  05/28/2014  FINDINGS: Again noted is a gas filled stomach. Nasogastric tube tip in the region of the proximal stomach. Abdominal bowel gas pattern is nonspecific with minimal gas.  IMPRESSION: Nasogastric tube in the proximal stomach region.   Electronically Signed   By: Richarda OverlieAdam  Henn M.D.   On: 05/28/2014 22:57   Dg Abd Portable 1v  05/28/2014   CLINICAL DATA:   Abdominal distention  EXAM: PORTABLE ABDOMEN - 1 VIEW  COMPARISON:  03/20/2014  FINDINGS: Study limited by portable technique and motion degradation.  There is mild to moderate gaseous distention of the stomach. The overall bowel gas pattern is nonobstructive. No concerning mass effect or calcification in the abdomen. Basilar lung opacities, recently evaluated by chest x-ray.  IMPRESSION: Nonobstructive bowel gas pattern.   Electronically Signed   By: Tiburcio PeaJonathan  Watts M.D.   On: 05/28/2014 21:32     Date: 05/28/14- 15:05  Rate: 38  Rhythm: sinus bradycardia  QRS Axis: normal  PR and QT Intervals: normal  ST/T Wave abnormalities: normal  PR  and QRS Conduction Disutrbances:left bundle branch block  Narrative Interpretation:   Old EKG Reviewed: changes noted     EKG Interpretation   Date/Time:  Thursday May 28 2014 15:23:47 EST Ventricular Rate:  45 PR Interval:  68 QRS Duration: 170 QT Interval:  287 QTC Calculation: 417 R Axis:   58 Text Interpretation:  indeterminate rhythym Probable left atrial  enlargement Left bundle branch block vs. abberant conduction. Baseline wander in lead(s) V6 Since  last tracing of earlier today rhythym has changed  and LBBB is new  Confirmed by [facility]  MD, Jandel Patriarca [id number]) on 05/28/2014 3:55:13 PM      MDM   Final diagnoses:  Acute respiratory failure with hypercapnia  AKI (acute kidney injury)    Acute respiratory failure, cause not clear, but likely related to COPD exacerbation.  No evidence for serious bacterial infection or metabolic instability.  He will require admission to intensive care unit by a pulmonary critical care physician  Nursing Notes Reviewed/ Care Coordinated, and agree without changes. Applicable Imaging Reviewed.  Interpretation of Laboratory Data incorporated into ED treatment  Plan: Admit    Flint Melter, MD 05/28/14 2356

## 2014-05-28 NOTE — ED Notes (Addendum)
Shortness of breate. RA 82%. Wheezing and lack of air movement. Pt has distended tight belly. Daughter states when he gets UTI, he gets like this. Audible wheezing.  Feels nauseated but denies pain. NO chest pain.

## 2014-05-29 ENCOUNTER — Inpatient Hospital Stay (HOSPITAL_COMMUNITY): Payer: Medicare Other

## 2014-05-29 ENCOUNTER — Encounter (HOSPITAL_COMMUNITY): Payer: Self-pay | Admitting: Radiology

## 2014-05-29 DIAGNOSIS — J9602 Acute respiratory failure with hypercapnia: Secondary | ICD-10-CM | POA: Insufficient documentation

## 2014-05-29 DIAGNOSIS — I359 Nonrheumatic aortic valve disorder, unspecified: Secondary | ICD-10-CM

## 2014-05-29 DIAGNOSIS — J9690 Respiratory failure, unspecified, unspecified whether with hypoxia or hypercapnia: Secondary | ICD-10-CM

## 2014-05-29 LAB — CBC
HEMATOCRIT: 43.3 % (ref 39.0–52.0)
HEMATOCRIT: 42 % (ref 39.0–52.0)
HEMOGLOBIN: 13.8 g/dL (ref 13.0–17.0)
Hemoglobin: 13.2 g/dL (ref 13.0–17.0)
MCH: 28.9 pg (ref 26.0–34.0)
MCH: 29.3 pg (ref 26.0–34.0)
MCHC: 31.9 g/dL (ref 30.0–36.0)
MCHC: 31.4 g/dL (ref 30.0–36.0)
MCV: 90.8 fL (ref 78.0–100.0)
MCV: 93.3 fL (ref 78.0–100.0)
Platelets: 112 10*3/uL — ABNORMAL LOW (ref 150–400)
Platelets: 100 10*3/uL — ABNORMAL LOW (ref 150–400)
RBC: 4.77 MIL/uL (ref 4.22–5.81)
RBC: 4.5 MIL/uL (ref 4.22–5.81)
RDW: 15.2 % (ref 11.5–15.5)
RDW: 15.8 % — ABNORMAL HIGH (ref 11.5–15.5)
WBC: 7.7 10*3/uL (ref 4.0–10.5)
WBC: 7.7 10*3/uL (ref 4.0–10.5)

## 2014-05-29 LAB — TROPONIN I
TROPONIN I: 0.37 ng/mL — AB (ref <0.30)
TROPONIN I: 0.35 ng/mL — AB (ref <0.30)
Troponin I: 0.47 ng/mL (ref <0.30)
Troponin I: 0.55 ng/mL (ref <0.30)

## 2014-05-29 LAB — BASIC METABOLIC PANEL
ANION GAP: 12 (ref 5–15)
Anion gap: 15 (ref 5–15)
BUN: 47 mg/dL — AB (ref 6–23)
BUN: 49 mg/dL — ABNORMAL HIGH (ref 6–23)
CO2: 29 mEq/L (ref 19–32)
CO2: 28 mEq/L (ref 19–32)
Calcium: 8.7 mg/dL (ref 8.4–10.5)
Calcium: 8.5 mg/dL (ref 8.4–10.5)
Chloride: 97 mEq/L (ref 96–112)
Chloride: 100 mEq/L (ref 96–112)
Creatinine, Ser: 2.09 mg/dL — ABNORMAL HIGH (ref 0.50–1.35)
Creatinine, Ser: 2.37 mg/dL — ABNORMAL HIGH (ref 0.50–1.35)
GFR calc Af Amer: 33 mL/min — ABNORMAL LOW (ref >90)
GFR calc non Af Amer: 28 mL/min — ABNORMAL LOW (ref >90)
GFR calc non Af Amer: 24 mL/min — ABNORMAL LOW (ref >90)
GFR, EST AFRICAN AMERICAN: 28 mL/min — AB (ref >90)
GLUCOSE: 111 mg/dL — AB (ref 70–99)
Glucose, Bld: 108 mg/dL — ABNORMAL HIGH (ref 70–99)
POTASSIUM: 3.7 meq/L (ref 3.7–5.3)
POTASSIUM: 4.2 meq/L (ref 3.7–5.3)
SODIUM: 140 meq/L (ref 137–147)
Sodium: 141 mEq/L (ref 137–147)

## 2014-05-29 LAB — HEPARIN LEVEL (UNFRACTIONATED)

## 2014-05-29 LAB — PHOSPHORUS: Phosphorus: 3.2 mg/dL (ref 2.3–4.6)

## 2014-05-29 LAB — PROTIME-INR
INR: 1.07 (ref 0.00–1.49)
PROTHROMBIN TIME: 14 s (ref 11.6–15.2)

## 2014-05-29 LAB — POCT I-STAT 3, ART BLOOD GAS (G3+)
ACID-BASE EXCESS: 5 mmol/L — AB (ref 0.0–2.0)
Bicarbonate: 30.1 mEq/L — ABNORMAL HIGH (ref 20.0–24.0)
O2 Saturation: 95 %
PCO2 ART: 47.6 mmHg — AB (ref 35.0–45.0)
Patient temperature: 99.3
TCO2: 32 mmol/L (ref 0–100)
pH, Arterial: 7.412 (ref 7.350–7.450)
pO2, Arterial: 79 mmHg — ABNORMAL LOW (ref 80.0–100.0)

## 2014-05-29 LAB — AMYLASE: Amylase: 292 U/L — ABNORMAL HIGH (ref 0–105)

## 2014-05-29 LAB — URINE CULTURE
Colony Count: NO GROWTH
Culture: NO GROWTH

## 2014-05-29 LAB — LACTIC ACID, PLASMA
LACTIC ACID, VENOUS: 2.3 mmol/L — AB (ref 0.5–2.2)
LACTIC ACID, VENOUS: 1.6 mmol/L (ref 0.5–2.2)

## 2014-05-29 LAB — LEGIONELLA ANTIGEN, URINE

## 2014-05-29 LAB — PROCALCITONIN
PROCALCITONIN: 0.16 ng/mL
Procalcitonin: 0.29 ng/mL

## 2014-05-29 LAB — TSH: TSH: 2.7 u[IU]/mL (ref 0.350–4.500)

## 2014-05-29 LAB — D-DIMER, QUANTITATIVE: D-Dimer, Quant: 1.13 ug/mL-FEU — ABNORMAL HIGH (ref 0.00–0.48)

## 2014-05-29 LAB — MAGNESIUM: MAGNESIUM: 1.9 mg/dL (ref 1.5–2.5)

## 2014-05-29 LAB — CK: CK TOTAL: 272 U/L — AB (ref 7–232)

## 2014-05-29 LAB — LIPASE, BLOOD: Lipase: 53 U/L (ref 11–59)

## 2014-05-29 MED ORDER — HEPARIN BOLUS VIA INFUSION
4000.0000 [IU] | Freq: Once | INTRAVENOUS | Status: DC
  Filled 2014-05-29: qty 4000

## 2014-05-29 MED ORDER — HEPARIN BOLUS VIA INFUSION
2000.0000 [IU] | Freq: Once | INTRAVENOUS | Status: AC
  Administered 2014-05-29: 2000 [IU] via INTRAVENOUS
  Filled 2014-05-29: qty 2000

## 2014-05-29 MED ORDER — HEPARIN (PORCINE) IN NACL 100-0.45 UNIT/ML-% IJ SOLN
1400.0000 [IU]/h | INTRAMUSCULAR | Status: DC
  Administered 2014-05-29 – 2014-05-30 (×2): 1300 [IU]/h via INTRAVENOUS
  Administered 2014-05-30: 1400 [IU]/h via INTRAVENOUS
  Filled 2014-05-29 (×5): qty 250

## 2014-05-29 MED ORDER — SODIUM CHLORIDE 0.9 % IV BOLUS (SEPSIS)
1000.0000 mL | Freq: Once | INTRAVENOUS | Status: AC
  Administered 2014-05-29: 1000 mL via INTRAVENOUS

## 2014-05-29 MED ORDER — CHLORHEXIDINE GLUCONATE 0.12 % MT SOLN
15.0000 mL | Freq: Two times a day (BID) | OROMUCOSAL | Status: DC
  Administered 2014-05-29 – 2014-05-31 (×5): 15 mL via OROMUCOSAL
  Filled 2014-05-29 (×4): qty 15

## 2014-05-29 MED ORDER — CHLORHEXIDINE GLUCONATE 0.12 % MT SOLN: 15.0000 mL | Freq: Two times a day (BID) | OROMUCOSAL | Status: DC

## 2014-05-29 MED ORDER — MIDAZOLAM HCL 2 MG/2ML IJ SOLN
INTRAMUSCULAR | Status: AC
  Filled 2014-05-29: qty 2

## 2014-05-29 MED ORDER — CETYLPYRIDINIUM CHLORIDE 0.05 % MT LIQD
7.0000 mL | Freq: Four times a day (QID) | OROMUCOSAL | Status: DC
  Administered 2014-05-29: 7 mL via OROMUCOSAL

## 2014-05-29 MED ORDER — CETYLPYRIDINIUM CHLORIDE 0.05 % MT LIQD
7.0000 mL | Freq: Four times a day (QID) | OROMUCOSAL | Status: DC
  Administered 2014-05-29 – 2014-05-31 (×8): 7 mL via OROMUCOSAL

## 2014-05-29 MED ORDER — MIDAZOLAM HCL 2 MG/2ML IJ SOLN
1.0000 mg | INTRAMUSCULAR | Status: DC | PRN
  Administered 2014-05-29 (×2): 2 mg via INTRAVENOUS
  Filled 2014-05-29: qty 2

## 2014-05-29 NOTE — Progress Notes (Signed)
INITIAL NUTRITION ASSESSMENT  DOCUMENTATION CODES Per approved criteria  -Obesity Unspecified   INTERVENTION: If pt unable to be extubated, recommend initiation of TF via OGT with Vital HP at 25 ml/h and Prostat 30 ml BID on day 1; on day 2, d/c Prostat and increase to goal rate of 70 ml/h (1680 ml per day) to provide 1680 kcals (70% of estimated needs), 147 gm protein (100% of estimated needs), 1411 ml free water daily.  NUTRITION DIAGNOSIS: Inadequate oral intake related to inability to eat as evidenced by NPO.   Goal: Enteral nutrition to provide 60-70% of estimated calorie needs (22-25 kcals/kg ideal body weight) and 100% of estimated protein needs, based on ASPEN guidelines for hypocaloric, high protein feeding in critically ill obese individuals  Monitor:  Weight trend, initiation of TF, labs, I/O's, vent status/settings  Reason for Assessment: Vent  80 y.o. male  Admitting Dx: Acute respiratory failure with hypoxia and hypercarbia  ASSESSMENT: 80 yo M brought to [facility] ED by EMS on 11/4 after have a change in mental status. Initially heard to be wheezing and had an episode of hypoxemia (82% on room air). Venturi mask was placed with no improvement. He was placed on 15 mL of NRB and sats improved to 96%. Found to be hypercarbic (7.17/99.2/99.0) and hypoxic on NRB, so placed on BiPAP. Failed to improved on BiPAP and was intubated in the ED.   Patient is currently intubated on ventilator support MV: 12.1 L/min Temp (24hrs), Avg:98.2 F (36.8 C), Min:94.2 F (34.6 C), Max:100.4 F (38 C)  - Spoke with pt's family who report no recent weight loss. They say that pt was eating normally prior to admission, but would complain of nausea after meals for the past month.  - Pt with no signs of fat or muscle wasting.  Labs: CBGs: 93-247  Height: Ht Readings from Last 1 Encounters:  05/28/14 5\' 11"  (1.803 m)    Weight: Wt Readings from Last 1 Encounters:  05/29/14 250 lb 10.6  oz (113.7 kg)    Ideal Body Weight: 75.3 kg  % Ideal Body Weight: 151%  Wt Readings from Last 10 Encounters:  05/29/14 250 lb 10.6 oz (113.7 kg)  04/21/14 241 lb (109.317 kg)  03/25/14 242 lb 6 oz (109.941 kg)  03/21/14 240 lb 4.8 oz (108.999 kg)  02/19/14 241 lb 1.9 oz (109.371 kg)  02/10/14 238 lb 6.4 oz (108.138 kg)  01/19/14 239 lb 12.8 oz (108.773 kg)  12/25/13 240 lb (108.863 kg)  10/16/13 233 lb 6.4 oz (105.87 kg)  10/01/13 240 lb (108.863 kg)    BMI:  Body mass index is 34.98 kg/(m^2).  Estimated Nutritional Needs: Kcal: 2250-2400 Protein: 145-155 g Fluid: Per MD  Skin: Intact  Diet Order: Diet NPO time specified  EDUCATION NEEDS: -Education needs addressed   Intake/Output Summary (Last 24 hours) at 05/29/14 1127 Last data filed at 05/29/14 1100  Gross per 24 hour  Intake 900.52 ml  Output   1110 ml  Net -209.48 ml    Last BM: prior to admission   Labs:   Recent Labs Lab 05/28/14 1508 05/28/14 1849 05/29/14 0240  NA 141  --  141  K 4.1  --  3.7  CL 96  --  97  CO2 33*  --  29  BUN 42*  --  47*  CREATININE 1.74* 1.93* 2.09*  CALCIUM 9.0  --  8.7  MG  --  2.3 1.9  PHOS  --   --  3.2  GLUCOSE 118*  --  111*    CBG (last 3)   Recent Labs  05/28/14 2039  GLUCAP 247*    Scheduled Meds: . antiseptic oral rinse  7 mL Mouth Rinse QID  . chlorhexidine  15 mL Mouth Rinse BID  . pantoprazole (PROTONIX) IV  40 mg Intravenous Q24H    Continuous Infusions: . DOPamine Stopped (05/28/14 2337)  . fentaNYL infusion INTRAVENOUS 50 mcg/hr (05/29/14 1114)  . heparin 1,300 Units/hr (05/29/14 0923)  . norepinephrine (LEVOPHED) Adult infusion 9 mcg/min (05/29/14 1125)    Past Medical History  Diagnosis Date  . Gout, unspecified     severe dz, "treatment done that last 15 years"  . OBESITY   . RESTLESS LEG SYNDROME   . DETACHMENT, RETINAL NOS 1994  . CATARACT NOS 2008  . HYPERTENSION   . CORONARY ARTERY DISEASE   . PERICARDITIS, ACUTE NEC  2007    MSSA, s/p pericardial window  . GERD   . DEGENERATIVE JOINT DISEASE   . CAROTID ENDARTERECTOMY, LEFT, HX OF   . OBSTRUCTIVE SLEEP APNEA     CPAP qhs  . TIA (transient ischemic attack) 2005  . BPH (benign prostatic hypertrophy)     "minor"  . Lumbar spinal stenosis   . Carotid artery occlusion   . Unspecified venous (peripheral) insufficiency   . Stroke May 2003  . MYOCARDIAL INFARCTION 2004, 03/2010    "minor"  . Anxiety   . Depression   . Shortness of breath     Past Surgical History  Procedure Laterality Date  . Pericardial window  2007  . Left arm  2008    shoulder  . Right arm  1970's    shoulder  . Cataract extraction      Left side x's 2 and right  . Retinal detachment surgery      left side  . Carotid endarterectomy Left 12-06-05    cea  . Coronary angioplasty  01/2009    x 1 stent  . Colonoscopy    . Carpal tunnel release Bilateral   . Back surgery  2013    removed bone spurs  . Inguinal hernia repair Left 10/01/2013    Procedure: HERNIA REPAIR INGUINAL ADULT;  Surgeon: Axel Filler, MD;  Location: [facility];  Service: General;  Laterality: Left;  . Insertion of mesh Left 10/01/2013    Procedure: INSERTION OF MESH;  Surgeon: Axel Filler, MD;  Location: [facility];  Service: General;  Laterality: Left;  . Hernia repair    . Finger amputation  2015    JUST TO FIRST JOINT RIGHT HAND  LAST FINGER    Emmaline Kluver RD, LDN

## 2014-05-29 NOTE — Progress Notes (Signed)
ANTICOAGULATION CONSULT NOTE  Pharmacy Consult for Heparin Indication: chest pain/ACS  Allergies  Allergen Reactions  . Atorvastatin Other (See Comments)    REACTION: tol simvastatin ok per pt ( Pt. Says ALL the Statins ) Severe muscle weakness    Patient Measurements: Height: 5\' 11"  (180.3 cm) Weight: 250 lb 10.6 oz (113.7 kg) IBW/kg (Calculated) : 75.3 Heparin Dosing Weight: 100 kg  Vital Signs: Temp: 100.7 F (38.2 C) (11/06 1640) Temp Source: Oral (11/06 1640) BP: 113/51 mmHg (11/06 1800) Pulse Rate: 66 (11/06 1800)  Labs:  Recent Labs  05/28/14 1849  05/29/14 0240 05/29/14 0739 05/29/14 0853 05/29/14 1316 05/29/14 1523 05/29/14 1619  HGB 15.3  --  13.8  --   --   --   --  13.2  HCT 49.8  --  43.3  --   --   --   --  42.0  PLT 101*  --  112*  --   --   --   --  100*  LABPROT  --   --   --   --  14.0  --   --   --   INR  --   --   --   --  1.07  --   --   --   HEPARINUNFRC  --   --   --   --   --   --   --  <0.10*  CREATININE 1.93*  --  2.09*  --   --   --   --  2.37*  CKTOTAL  --   --   --  272*  --   --   --   --   TROPONINI  --   < > 0.47* 0.55*  --  0.37* 0.35*  --   < > = values in this interval not displayed.  Estimated Creatinine Clearance: 32.4 mL/min (by C-G formula based on Cr of 2.37).   Assessment: 66 YOM with history of CAD and PCI and 2010 as well as staph pericarditis in 2007 admitted with acute change in mental status, hypoxemia, bradycardia, and found to have elevated Troponin of 0.47 and elevated d-dimer. Pharmacy consulted to start IV heparin for ACS. H/H is within normal limits. Platelets are low at 112 (101 on admit) with known CKD stage IV. No bleeding reported. Patient was on ASA and Plavix prior to admission.   Patient was on sq heparin - last dose of 5000 units given at 0544 AM.  Goal of Therapy:  Heparin level 0.3-0.7 units/ml Monitor platelets by anticoagulation protocol: Yes   Plan:  1. Small heparin bolus of 2000 units (due to  recent sq heparin dose and low platelets).  2. Heparin drip at 1300 units/hr.  3. Heparin level and CBC in 8 hours. Will monitor platelets closely.  4. Daily heparin level and CBC while on therapy.   Link Snuffer, PharmD, BCPS Clinical Pharmacist [phone] 05/29/2014,7:27 PM   ADDENDUM: A heparin level was drawn around 1600 this evening and resulted as undetectable. RN noticed heparin line was detached, but did not know for how long- likely the reason for the undetectable level. Heparin resumed at 1300 units/hr at 1630.   Plan: -Will recheck a level 8 hours from the restart at 0030 to get a true level.  Shed Nixon D. Deleah Tison, PharmD, BCPS Clinical Pharmacist Pager: [phone] 05/29/2014 7:28 PM

## 2014-05-29 NOTE — Progress Notes (Signed)
VASCULAR LAB PRELIMINARY  PRELIMINARY  PRELIMINARY  PRELIMINARY  Bilateral lower extremity venous duplex  completed.    Preliminary report:  Bilateral:  No evidence of DVT, superficial thrombosis, or Baker's Cyst.    Nalany Steedley, RVT 05/29/2014, 2:10 PM

## 2014-05-29 NOTE — Progress Notes (Signed)
  Echocardiogram 2D Echocardiogram has been performed.  [name] FRANCES 05/29/2014, 3:36 AM

## 2014-05-29 NOTE — Care Management Note (Signed)
    Page 1 of 1   06/01/2014     1:11:02 PM CARE MANAGEMENT NOTE 06/01/2014  Patient:  [name], [name]   Account Number:  [id number]  Date Initiated:  05/29/2014  Documentation initiated by:  Avie Arenas  Subjective/Objective Assessment:   Admitted with resp failure - intubated.     Action/Plan:   Anticipated DC Date:  06/05/2014   Anticipated DC Plan:  SKILLED NURSING FACILITY  In-house referral  Clinical Social Worker      DC Planning Services  CM consult      Choice offered to / List presented to:             Status of service:  In process, will continue to follow Medicare Important Message given?  YES (If response is "NO", the following Medicare IM given date fields will be blank) Date Medicare IM given:  06/01/2014 Medicare IM given by:  [facility] Date Additional Medicare IM given:   Additional Medicare IM given by:    Discharge Disposition:    Per UR Regulation:  Reviewed for med. necessity/level of care/duration of stay  If discussed at Long Length of Stay Meetings, dates discussed:    Comments:  Contact: Pressley,Debra Daughter [phone]  [phone]                  Azure, [place]   [phone]  06-01-14 9:50am Avie Arenas, RNBSN [phone] Extubated on 05-31-14.  on 6L [place].  confirmed that he does live at home alone.  Has used [facility] in past.  Discussed rehab prior to going home.  Is agreeable to that.  PT to eval.  CM will continue to follow.  05-29-14  3:40pm Avie Arenas, RNBSN [phone] Lives at home alone - has support of family that per nurse do assist post discharge.  CM will continue to follow.

## 2014-05-29 NOTE — Progress Notes (Signed)
PULMONARY / CRITICAL CARE MEDICINE   Name: [name] MRN: [id number] DOB: 1932/10/29    ADMISSION DATE:  05/28/2014 CONSULTATION DATE:  05/28/14  REFERRING MD :  EDP  CHIEF COMPLAINT:  Respiratory failure   INITIAL PRESENTATION: 80 [facility]4 after have a change in mental status. Initially heard to be wheezing and had an episode of hypoxemia (82% on room air). Venturi mask was placed with no improvement. He was placed on 15 mL of NRB and sats improved to 96%. Found to be hypercarbic (7.17/99.2/99.0) and hypoxic on NRB, so placed on BiPAP. Failed to improved on BiPAP and was intubated in the ED.   STUDIES:  11/5: intubated  11/5: CXR showing cardiac enlargement and edema.  11/6: CT head no abnormalities  11/6: KUB >> NG in place  11/6: elevated troponin, D-dimer    SUBJECTIVE:   VITAL SIGNS: Temp:  [94.2 F (34.6 C)-100.4 F (38 C)] 98.3 F (36.8 C) (11/06 0847) Pulse Rate:  [33-83] 55 (11/06 0900) Resp:  [9-28] 20 (11/06 0900) BP: (47-157)/(26-110) 108/43 mmHg (11/06 0900) SpO2:  [71 %-100 %] 100 % (11/06 0900) FiO2 (%):  [40 %-100 %] 40 % (11/06 0800) Weight:  [250 lb 10.6 oz (113.7 kg)] 250 lb 10.6 oz (113.7 kg) (11/06 0454) HEMODYNAMICS: CVP:  [16 mmHg-26 mmHg] 26 mmHg VENTILATOR SETTINGS: Vent Mode:  [-] PRVC FiO2 (%):  [40 %-100 %] 40 % Set Rate:  [16 bmp-20 bmp] 20 bmp Vt Set:  [600 mL] 600 mL PEEP:  [5 cmH20] 5 cmH20 Plateau Pressure:  [22 cmH20-28 cmH20] 22 cmH20 INTAKE / OUTPUT:  Intake/Output Summary (Last 24 hours) at 05/29/14 0924 Last data filed at 05/29/14 0900  Gross per 24 hour  Intake  836.3 ml  Output   1000 ml  Net -163.7 ml    PHYSICAL EXAMINATION: General: Obese male, laying in bed not responding to stimuli  Neuro: no spontaneous eye opening, no verbal response and no motor response  HEENT: right pupil pinpoint, left pupil 4 mm  Cardiovascular: Bradycardic, S1S2, no murmurs  Lungs: Breaths sounds  heard b/l, no wheezing  Abdomen: Distended, +BS,  Musculoskeletal: +2 pitting edema b/l to proximal tibia, 4 cm x 4 cm pre-tibial mass (reported as chronic from his gout)  Skin: scattered abrasions on b/l anterior shins b/l   LABS: PULMONARY  Recent Labs Lab 05/28/14 1621 05/28/14 1949 05/28/14 2124 05/28/14 2337 05/29/14 0850  PHART 7.177* 7.224* 7.323*  --  7.412  PCO2ART 99.2* 84.3* 51.3*  --  47.6*  PO2ART 99.0 282.0* 239.0*  --  79.0*  HCO3 36.7* 36.0* 25.9*  --  30.1*  TCO2 40 39 27.4  --  32  O2SAT 95.0 100.0 99.3 61.2 95.0    CBC  Recent Labs Lab 05/28/14 1508 05/28/14 1849 05/29/14 0240  HGB 14.5 15.3 13.8  HCT 46.4 49.8 43.3  WBC 5.6 8.7 7.7  PLT 103* 101* 112*    COAGULATION  Recent Labs Lab 05/29/14 0853  INR 1.07    CARDIAC    Recent Labs Lab 05/29/14 0240 05/29/14 0739  TROPONINI 0.47* 0.55*    Recent Labs Lab 05/28/14 1508  PROBNP 477.3*     CHEMISTRY  Recent Labs Lab 05/28/14 1508 05/28/14 1849 05/29/14 0240  NA 141  --  141  K 4.1  --  3.7  CL 96  --  97  CO2 33*  --  29  GLUCOSE 118*  --  111*  BUN 42*  --  47*  CREATININE 1.74* 1.93* 2.09*  CALCIUM 9.0  --  8.7  MG  --  2.3 1.9  PHOS  --   --  3.2   Estimated Creatinine Clearance: 36.8 mL/min (by C-G formula based on Cr of 2.09).   LIVER  Recent Labs Lab 05/29/14 0853  INR 1.07     INFECTIOUS  Recent Labs Lab 05/28/14 1544 05/28/14 1829 05/28/14 2124 05/29/14 0240 05/29/14 0242  LATICACIDVEN 1.63 0.88  --   --  2.3*  PROCALCITON  --   --  0.16 0.29  --      ENDOCRINE CBG (last 3)   Recent Labs  05/28/14 2039  GLUCAP 247*    IMAGING x48h Ct Head Wo Contrast  05/29/2014   CLINICAL DATA:  Admitted for COPD exacerbation. Now altered mental status with neurological changes.  EXAM: CT HEAD WITHOUT CONTRAST  TECHNIQUE: Contiguous axial images were obtained from the base of the skull through the vertex without intravenous contrast.   COMPARISON:  03/20/2014  FINDINGS: Diffuse cerebral atrophy. Low-attenuation changes in the deep white matter consistent with small vessel ischemia. No mass effect or midline shift. No abnormal extra-axial fluid collections. Gray-white matter junctions are distinct. Basal cisterns are not effaced. No evidence of acute intracranial hemorrhage. No depressed skull fractures. Postoperative changes in the left globe. Opacification of some of the ethmoid air cells. Vascular calcifications. Endotracheal and enteric tubes are in place.  IMPRESSION: No acute intracranial abnormalities. Chronic atrophy and small vessel ischemic changes.   Electronically Signed   By: Burman Nieves M.D.   On: 05/29/2014 01:49   Dg Chest Port 1 View  05/29/2014   CLINICAL DATA:  Respiratory failure.  EXAM: PORTABLE CHEST - 1 VIEW  COMPARISON:  05/28/2014  FINDINGS: Endotracheal tube is in place, estimated to be approximately 4.1 cm above carina. Nasogastric tube is in place with tip overlying the level of the proximal stomach. Left IJ central line tip overlies the brachiocephalic -SVC confluence.  Heart is enlarged. There is mild interstitial edema. Dense opacity is identified at the medial lung bases.  IMPRESSION: 1. Lines and tubes as described. 2. Cardiomegaly and mild edema. 3. Dense opacity at the medial lung bases consistent with consolidation or atelectasis.   Electronically Signed   By: Rosalie Gums M.D.   On: 05/29/2014 07:13   Dg Chest Port 1 View  05/28/2014   CLINICAL DATA:  Central line placement  EXAM: PORTABLE CHEST - 1 VIEW  COMPARISON:  05/28/2014  FINDINGS: Endotracheal tube tip measures 3.8 cm above the carinal. Enteric tube tip is off the field of view but below the left hemidiaphragm. Interval placement of a left central venous catheter. The tip is directed horizontally over the mid SVC region consistent with location of the junction of the brachiocephalic and superior vena cava. No pneumothorax. Shallow inspiration.  Cardiac enlargement. Fluid in the right major fissure. Patchy infiltration in the left perihilar region. No blunting of costophrenic angles.  IMPRESSION: Left central venous catheter tip directed horizontally in the region of the junction of the brachiocephalic vein and SVC. No pneumothorax. Patchy perihilar infiltration on the left. Shallow inspiration.   Electronically Signed   By: Burman Nieves M.D.   On: 05/28/2014 22:56   Dg Chest Portable 1 View  05/28/2014   CLINICAL DATA:  Shortness of breath.  Endotracheal tube placement.  EXAM: PORTABLE CHEST - 1 VIEW 7:54 p.m.  COMPARISON:  05/28/2014 at 3:51 p.m. and 04/21/2014  FINDINGS: Endotracheal tube has been inserted and the tip is in good position 2.8 cm above the carina. Pulmonary edema has resolved on the right and is much improved on the left. There is slight atelectasis in the left midzone the left base medially. Small amount of fluid along the right minor fissure. Tremors thoracic aorta.  IMPRESSION: Endotracheal tube in good position.  Improving pulmonary edema.  Slight atelectasis on the left.   Electronically Signed   By: Geanie Cooley M.D.   On: 05/28/2014 20:39   Dg Chest Port 1 View  05/28/2014   CLINICAL DATA:  Shortness of breath for 1 day  EXAM: PORTABLE CHEST - 1 VIEW  COMPARISON:  04/21/2014  FINDINGS: Diminished exam detail secondary to patient's body habitus, portable technique and low lung volumes. Cardiac enlargement noted. The lung volumes are low. Mild pulmonary edema is identified bilaterally. No airspace consolidation.  IMPRESSION: 1. Suboptimal exam. 2. Cardiac enlargement and edema.   Electronically Signed   By: Signa Kell M.D.   On: 05/28/2014 16:03   Dg Abd Portable 1v  05/28/2014   CLINICAL DATA:  Evaluate nasogastric tube placement.Encounter for orogastric (OG) tube placement Z46.59 (ICD-10-CM)  EXAM: PORTABLE ABDOMEN - 1 VIEW  COMPARISON:  05/28/2014  FINDINGS: Again noted is a gas filled stomach. Nasogastric tube tip  in the region of the proximal stomach. Abdominal bowel gas pattern is nonspecific with minimal gas.  IMPRESSION: Nasogastric tube in the proximal stomach region.   Electronically Signed   By: Richarda Overlie M.D.   On: 05/28/2014 22:57   Dg Abd Portable 1v  05/28/2014   CLINICAL DATA:  Abdominal distention  EXAM: PORTABLE ABDOMEN - 1 VIEW  COMPARISON:  03/20/2014  FINDINGS: Study limited by portable technique and motion degradation.  There is mild to moderate gaseous distention of the stomach. The overall bowel gas pattern is nonobstructive. No concerning mass effect or calcification in the abdomen. Basilar lung opacities, recently evaluated by chest x-ray.  IMPRESSION: Nonobstructive bowel gas pattern.   Electronically Signed   By: Tiburcio Pea M.D.   On: 05/28/2014 21:32     ASSESSMENT / PLAN:  PULMONARY OETT 11/4>> D-dimer: 1.13  A: Acute Respiratory failure  Acute on Chronic hypercarbia  OSA   - still requiring Vent 11/6  P:  Full mechanical ventilation  Trend ABG   CARDIOVASCULAR A: hypotensive - resolved.  Hx of ASCAD w/ prior PCI of left circ, HTN, acute staph pericarditis w/ pericardial window in 2007, MI, and PVD s/p L CEA  initial EKG new LBBB Elevate troponin   - blood pressure hypertensive currently 11/6  P:  Levophed  Heparin drip  Holding Plavix, ASA  Holding Metoprolol  CVC for CVP monitoring  RENAL A: Acute renal failure  Lactic acidosis  CKD stage IV (Cr 1.6 baseline)  BPH  P:  Trend lactate  Holding Flomax, lasix, myrbetriq  GASTROINTESTINAL A: Abdominal distention Reported constipation  GERD  P:   protonix  NS KVO  NPO   HEMATOLOGIC A: VTE Ppx  Thrombocytopenia  Hx of Gout  P:  SCD's  Holding home allopurinol   INFECTIOUS PCT >> 0.29  A: SIRS on admission (hypothermic and tachypnea)  Hx of repeated UTI's   P:  BCx2 11/05 UC 11/05 Sputum 11/05 Received CTX 11/5 Will hold abx for now but low  threshold to start   ENDOCRINE TSH >2.70 A: No acute issues P:    NEUROLOGIC A:  Acute encephalopathy, etiology unclear, suspect hypercapnia  Chronic spinal stenosis w/ chronic back pain Hx of TIA  Hx of Restless leg syndrome  P:  RASS goal: 0 WUA daily  Fentanyl gtt  Holding home requip, percocet, ativan, lyrica, wellbutrin  ? Consider LP if no clear etiology   FAMILY  - Updates: No family at bedside   - Inter-disciplinary family meet or Palliative Care meeting due by:  11/12    TODAY'S SUMMARY: 80 yo M presenting with hypercarbic respiratory failure, intubated. Hypercarbia has resolved and breathing status improving.     Myra Rude, MD PGY-2, [facility] 05/29/2014, 9:24 AM

## 2014-05-29 NOTE — Progress Notes (Signed)
Subjective:  Admitted with acute respiratory failure and bradycardia.  Has known CAD and prior stent.  Also prior right brain CVA with mild left sided weakness.  Currently intubated and sedated so no history obtainable.   Objective:  Vital Signs in the last 24 hours: BP 132/53 mmHg  Pulse 60  Temp(Src) 99.7 F (37.6 C) (Oral)  Resp 19  Ht 5\' 11"  (1.803 m)  Wt 113.7 kg (250 lb 10.6 oz)  BMI 34.98 kg/m2  SpO2 100%  Physical Exam: Obese plethoric WM intubated and asleep Lungs:  Reduced breath sounds Cardiac:  Regular rhythm, normal S1 and S2, no S3 2-3/6 harsh systolic murmur. Abdomen:  Soft, nontender, no masses Extremities:  1+edema present  Intake/Output from previous day: 11/05 0701 - 11/06 0700 In: 744.3 [I.V.:744.3] Out: 900 [Urine:900]  Weight Filed Weights   05/28/14 2008 05/29/14 0454  Weight: 113.7 kg (250 lb 10.6 oz) 113.7 kg (250 lb 10.6 oz)    Lab Results: Basic Metabolic Panel:  Recent Labs  26/41/58 1508 05/28/14 1849 05/29/14 0240  NA 141  --  141  K 4.1  --  3.7  CL 96  --  97  CO2 33*  --  29  GLUCOSE 118*  --  111*  BUN 42*  --  47*  CREATININE 1.74* 1.93* 2.09*   CBC:  Recent Labs  05/28/14 1508 05/28/14 1849 05/29/14 0240  WBC 5.6 8.7 7.7  NEUTROABS 2.7  --   --   HGB 14.5 15.3 13.8  HCT 46.4 49.8 43.3  MCV 93.5 96.0 90.8  PLT 103* 101* 112*   Cardiac Enzymes:  Recent Labs  05/29/14 0240 05/29/14 0739  CKTOTAL  --  272*  TROPONINI 0.47* 0.55*    Telemetry: Sinus rhythm  Assessment/Plan:  1. Acute respiratory failure undetermined type 2. Hypotension ? Sepsis 3. Moderate aortic stenosis 4. Acute renal failure 5. CAD with prior stent 6. Low level elevation of troponin possible demand ischemia 7. Thrombocytopenia  Rec:  Support for now and cycle troponin and await further testing. Suspect the cardiac issues were secondary rather than primary.   Darden Palmer  MD [facility]  05/29/2014, 1:46  PM
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
## 2014-05-29 NOTE — Progress Notes (Signed)
eLink Physician-Brief Progress Note Patient Name: [name] DOB: 1932/10/05 MRN: [id number]   Date of Service  05/29/2014  HPI/Events of Note  Severe agitation on vent Fentanyl gtt  eICU Interventions  Add versed prn     Intervention Category Major Interventions: Delirium, psychosis, severe agitation - evaluation and management  [name] 05/29/2014, 4:49 AM

## 2014-05-29 NOTE — Progress Notes (Addendum)
ANTICOAGULATION CONSULT NOTE - Initial Consult  Pharmacy Consult for Heparin Indication: chest pain/ACS  Allergies  Allergen Reactions  . Atorvastatin Other (See Comments)    REACTION: tol simvastatin ok per pt ( Pt. Says ALL the Statins ) Severe muscle weakness    Patient Measurements: Height: 5\' 11"  (180.3 cm) Weight: 250 lb 10.6 oz (113.7 kg) IBW/kg (Calculated) : 75.3 Heparin Dosing Weight: 100 kg  Vital Signs: Temp: 99.3 F (37.4 C) (11/06 0600) Temp Source: Core (Comment) (11/06 0400) BP: 101/45 mmHg (11/06 0800) Pulse Rate: 71 (11/06 0726)  Labs:  Recent Labs  05/28/14 1508 05/28/14 1849 05/29/14 0240  HGB 14.5 15.3 13.8  HCT 46.4 49.8 43.3  PLT 103* 101* 112*  CREATININE 1.74* 1.93* 2.09*  TROPONINI  --   --  0.47*    Estimated Creatinine Clearance: 36.8 mL/min (by C-G formula based on Cr of 2.09).   Medical History: Past Medical History  Diagnosis Date  . Gout, unspecified     severe dz, "treatment done that last 15 years"  . OBESITY   . RESTLESS LEG SYNDROME   . DETACHMENT, RETINAL NOS 1994  . CATARACT NOS 2008  . HYPERTENSION   . CORONARY ARTERY DISEASE   . PERICARDITIS, ACUTE NEC 2007    MSSA, s/p pericardial window  . GERD   . DEGENERATIVE JOINT DISEASE   . CAROTID ENDARTERECTOMY, LEFT, HX OF   . OBSTRUCTIVE SLEEP APNEA     CPAP qhs  . TIA (transient ischemic attack) 2005  . BPH (benign prostatic hypertrophy)     "minor"  . Lumbar spinal stenosis   . Carotid artery occlusion   . Unspecified venous (peripheral) insufficiency   . Stroke May 2003  . MYOCARDIAL INFARCTION 2004, 03/2010    "minor"  . Anxiety   . Depression   . Shortness of breath     Medications:  Scheduled:  . antiseptic oral rinse  7 mL Mouth Rinse QID  . atropine      . chlorhexidine  15 mL Mouth Rinse BID  . pantoprazole (PROTONIX) IV  40 mg Intravenous Q24H    Assessment: 47 YOM with history of CAD and PCI and 2010 as well as staph pericarditis in 2007  admitted with acute change in mental status, hypoxemia, bradycardia, and found to have elevated Troponin of 0.47 and elevated d-dimer. Pharmacy consulted to start IV heparin for ACS. H/H is within normal limits. Platelets are low at 112 (101 on admit) with known CKD stage IV. No bleeding reported. Patient was on ASA and Plavix prior to admission.   Patient was on sq heparin - last dose of 5000 units given at 0544 AM.  Goal of Therapy:  Heparin level 0.3-0.7 units/ml Monitor platelets by anticoagulation protocol: Yes   Plan:  1. Small heparin bolus of 2000 units (due to recent sq heparin dose and low platelets).  2. Heparin drip at 1300 units/hr.  3. Heparin level and CBC in 8 hours. Will monitor platelets closely.  4. Daily heparin level and CBC while on therapy.   Link Snuffer, PharmD, BCPS Clinical Pharmacist [phone] 05/29/2014,8:04 AM

## 2014-05-30 ENCOUNTER — Inpatient Hospital Stay (HOSPITAL_COMMUNITY): Payer: Medicare Other

## 2014-05-30 LAB — CBC
HCT: 41.2 % (ref 39.0–52.0)
HCT: 42.6 % (ref 39.0–52.0)
Hemoglobin: 13.1 g/dL (ref 13.0–17.0)
Hemoglobin: 13.4 g/dL (ref 13.0–17.0)
MCH: 29.1 pg (ref 26.0–34.0)
MCH: 29.1 pg (ref 26.0–34.0)
MCHC: 31.8 g/dL (ref 30.0–36.0)
MCHC: 31.5 g/dL (ref 30.0–36.0)
MCV: 91.6 fL (ref 78.0–100.0)
MCV: 92.4 fL (ref 78.0–100.0)
PLATELETS: 110 10*3/uL — AB (ref 150–400)
PLATELETS: 101 10*3/uL — AB (ref 150–400)
RBC: 4.5 MIL/uL (ref 4.22–5.81)
RBC: 4.61 MIL/uL (ref 4.22–5.81)
RDW: 15.9 % — AB (ref 11.5–15.5)
RDW: 15.8 % — AB (ref 11.5–15.5)
WBC: 7.2 10*3/uL (ref 4.0–10.5)
WBC: 7.1 10*3/uL (ref 4.0–10.5)

## 2014-05-30 LAB — BASIC METABOLIC PANEL
ANION GAP: 11 (ref 5–15)
BUN: 47 mg/dL — ABNORMAL HIGH (ref 6–23)
CO2: 28 meq/L (ref 19–32)
CREATININE: 2.04 mg/dL — AB (ref 0.50–1.35)
Calcium: 8.7 mg/dL (ref 8.4–10.5)
Chloride: 100 mEq/L (ref 96–112)
GFR calc Af Amer: 34 mL/min — ABNORMAL LOW (ref >90)
GFR calc non Af Amer: 29 mL/min — ABNORMAL LOW (ref >90)
Glucose, Bld: 105 mg/dL — ABNORMAL HIGH (ref 70–99)
Potassium: 3.9 mEq/L (ref 3.7–5.3)
SODIUM: 139 meq/L (ref 137–147)

## 2014-05-30 LAB — HEPARIN LEVEL (UNFRACTIONATED)
HEPARIN UNFRACTIONATED: 0.51 [IU]/mL (ref 0.30–0.70)
Heparin Unfractionated: 0.29 IU/mL — ABNORMAL LOW (ref 0.30–0.70)

## 2014-05-30 LAB — PHOSPHORUS: Phosphorus: 2.3 mg/dL (ref 2.3–4.6)

## 2014-05-30 LAB — TROPONIN I: Troponin I: <0.3 ng/mL (ref <0.30)

## 2014-05-30 LAB — MAGNESIUM: Magnesium: 2.1 mg/dL (ref 1.5–2.5)

## 2014-05-30 LAB — PROCALCITONIN: Procalcitonin: 0.37 ng/mL

## 2014-05-30 LAB — CORTISOL-AM, BLOOD: CORTISOL - AM: 8.8 ug/dL (ref 4.3–22.4)

## 2014-05-30 LAB — STREP PNEUMONIAE URINARY ANTIGEN: STREP PNEUMO URINARY ANTIGEN: NEGATIVE

## 2014-05-30 MED ORDER — METOPROLOL TARTRATE 1 MG/ML IV SOLN
2.5000 mg | Freq: Once | INTRAVENOUS | Status: AC
  Administered 2014-05-30: 2.5 mg via INTRAVENOUS

## 2014-05-30 MED ORDER — DEXMEDETOMIDINE HCL IN NACL 200 MCG/50ML IV SOLN
0.0000 ug/kg/h | INTRAVENOUS | Status: DC
  Administered 2014-05-30 – 2014-05-31 (×2): 0.6 ug/kg/h via INTRAVENOUS
  Filled 2014-05-30: qty 50

## 2014-05-30 MED ORDER — LEVOFLOXACIN IN D5W 750 MG/150ML IV SOLN
750.0000 mg | INTRAVENOUS | Status: DC
  Administered 2014-05-30 – 2014-06-01 (×2): 750 mg via INTRAVENOUS
  Filled 2014-05-30 (×2): qty 150

## 2014-05-30 MED ORDER — LORAZEPAM 2 MG/ML IJ SOLN
INTRAMUSCULAR | Status: AC
  Administered 2014-05-30: 1 mg via INTRAVENOUS
  Filled 2014-05-30: qty 1

## 2014-05-30 MED ORDER — FUROSEMIDE 10 MG/ML IJ SOLN
40.0000 mg | Freq: Two times a day (BID) | INTRAMUSCULAR | Status: DC
  Administered 2014-05-30 – 2014-06-02 (×8): 40 mg via INTRAVENOUS
  Filled 2014-05-30 (×12): qty 4

## 2014-05-30 MED ORDER — LORAZEPAM 2 MG/ML IJ SOLN
INTRAMUSCULAR | Status: AC
  Administered 2014-05-30: 1 mg via INTRAVENOUS
  Filled 2014-05-30: qty 2

## 2014-05-30 MED ORDER — LORAZEPAM 2 MG/ML IJ SOLN
0.5000 mg | INTRAMUSCULAR | Status: DC | PRN
Start: 2014-05-30 — End: 2014-06-05
  Administered 2014-05-30 (×2): 1 mg via INTRAVENOUS
  Administered 2014-06-01: 2 mg via INTRAVENOUS
  Administered 2014-06-02: 0.5 mg via INTRAVENOUS
  Administered 2014-06-03: 2 mg via INTRAVENOUS
  Filled 2014-05-30 (×3): qty 1

## 2014-05-30 MED ORDER — METOPROLOL TARTRATE 1 MG/ML IV SOLN
5.0000 mg | INTRAVENOUS | Status: DC | PRN
  Administered 2014-06-01 (×2): 5 mg via INTRAVENOUS
  Filled 2014-05-30 (×2): qty 5

## 2014-05-30 MED ORDER — METOPROLOL TARTRATE 1 MG/ML IV SOLN
INTRAVENOUS | Status: AC
  Administered 2014-05-30: 2.5 mg via INTRAVENOUS
  Filled 2014-05-30: qty 5

## 2014-05-30 MED ORDER — ROPINIROLE HCL 1 MG PO TABS
4.0000 mg | ORAL_TABLET | Freq: Every day | ORAL | Status: DC
  Administered 2014-05-30 – 2014-06-04 (×5): 4 mg via ORAL
  Filled 2014-05-30 (×7): qty 4

## 2014-05-30 MED ORDER — DEXMEDETOMIDINE HCL IN NACL 200 MCG/50ML IV SOLN
0.0000 ug/kg/h | INTRAVENOUS | Status: DC
  Administered 2014-05-30 (×2): 0.4 ug/kg/h via INTRAVENOUS
  Filled 2014-05-30 (×3): qty 50

## 2014-05-30 MED ORDER — INFLUENZA VAC SPLIT QUAD 0.5 ML IM SUSY
0.5000 mL | PREFILLED_SYRINGE | INTRAMUSCULAR | Status: DC
  Filled 2014-05-30: qty 0.5

## 2014-05-30 MED ORDER — PREGABALIN 100 MG PO CAPS
200.0000 mg | ORAL_CAPSULE | Freq: Every day | ORAL | Status: DC
  Administered 2014-05-30 – 2014-06-05 (×6): 200 mg via ORAL
  Filled 2014-05-30 (×2): qty 4
  Filled 2014-05-30 (×4): qty 2
  Filled 2014-05-30: qty 4

## 2014-05-30 NOTE — Progress Notes (Signed)
Nursing:  Pt found at 1130 w/ L AC PIV pulled out, linens underneath pt bloody d/t leakage from site. Heparin gtt stopped, pressure held to site to stop bleeding. RN notified pharmacist on unit that Heparin gtt stopped and notified Dr Marchelle Gearing of blood loss. CBC sent to lab- Hgb 13.4. Tubing changed on Heparin gtt and restarted in L IJ CVC. RN will continue to monitor, no s/s of bleeding at this time.

## 2014-05-30 NOTE — Plan of Care (Signed)
Problem: Consults Goal: Skin Care Protocol Initiated - if Braden Score 18 or less If consults are not indicated, leave blank or document N/A Outcome: Completed/Met Date Met:  05/30/14 Goal: Diabetes Guidelines if Diabetic/Glucose > 140 If diabetic or lab glucose is > 140 mg/dl - Initiate Diabetes/Hyperglycemia Guidelines & Document Interventions  Outcome: Not Applicable Date Met:  88/30/14  Problem: Phase I Progression Outcomes Goal: VTE prophylaxis Outcome: Completed/Met Date Met:  05/30/14 Goal: Oral Care per Protocol Outcome: Completed/Met Date Met:  05/30/14 Goal: HOB elevated 30 degrees Outcome: Completed/Met Date Met:  05/30/14 Goal: VAP prevention protocol initiated Outcome: Completed/Met Date Met:  05/30/14 Goal: Sedation Protocol initiated if indicated Outcome: Completed/Met Date Met:  05/30/14

## 2014-05-30 NOTE — Progress Notes (Signed)
Pt's urine now appears bloody, previously clear yellow, also having bloody secretions from ETT. Pt on Heparin gtt at 1400 units/hr. eLink physician notified. Lasix 40 mg IV given. Mbr in no distress, VSS at this time, afebrile 97.6 core temp. RN will continue to monitor.

## 2014-05-30 NOTE — Progress Notes (Signed)
Subjective:  Weaned off of sedatives and currently agitated and fighting vent. Troponins are low now.    Objective:  Vital Signs in the last 24 hours: BP 147/70 mmHg  Pulse 140  Temp(Src) 99.3 F (37.4 C) (Core (Comment))  Resp 20  Ht 5\' 11"  (1.803 m)  Wt 124 kg (273 lb 5.9 oz)  BMI 38.14 kg/m2  SpO2 100%  Physical Exam: Obese plethoric WM intubated and agitated, eyes open Lungs:  Mild wheezes Cardiac:  Rapid regular rhythm, normal S1 and S2, no S3 2-3/6 harsh systolic murmur. Extremities:  1+edema present  Intake/Output from previous day: 11/06 0701 - 11/07 0700 In: 1135.3 [I.V.:1135.3] Out: 990 [Urine:990]  Weight Filed Weights   05/28/14 2008 05/29/14 0454 05/30/14 0500  Weight: 113.7 kg (250 lb 10.6 oz) 113.7 kg (250 lb 10.6 oz) 124 kg (273 lb 5.9 oz)    Lab Results: Basic Metabolic Panel:  Recent Labs  76/19/50 1619 05/30/14 0300  NA 140 139  K 4.2 3.9  CL 100 100  CO2 28 28  GLUCOSE 108* 105*  BUN 49* 47*  CREATININE 2.37* 2.04*   CBC:  Recent Labs  05/28/14 1508  05/29/14 1619 05/30/14 0300  WBC 5.6  < > 7.7 7.2  NEUTROABS 2.7  --   --   --   HGB 14.5  < > 13.2 13.1  HCT 46.4  < > 42.0 41.2  MCV 93.5  < > 93.3 91.6  PLT 103*  < > 100* 110*  < > = values in this interval not displayed. Cardiac Enzymes:  Recent Labs  05/29/14 0739  05/29/14 1523 05/29/14 1954 05/30/14 0300  CKTOTAL 272*  --   --   --   --   TROPONINI 0.55*  < > 0.35* <0.30 <0.30  < > = values in this interval not displayed.  Telemetry: Sinus rhythm  Assessment/Plan:  1. Acute respiratory failure undetermined type 2. Hypotension resolved 3. Moderate aortic stenosis 4. Acute renal failure 5. CAD with prior stent 6. Low level elevation of troponin c/w demand ischemia 7. Thrombocytopenia 8. Now with recurrent SVT unclear whether a flutter SVT or sinus tachycardia  Rec:  Will need beta blocker restarted.   Check EKG now. Concerned about withdrawal from other  meds at present.   Darden Palmer  MD [facility]  05/30/2014, 9:34 AM

## 2014-05-30 NOTE — Progress Notes (Signed)
ANTICOAGULATION CONSULT NOTE - Follow Up Consult  Pharmacy Consult for heparin Indication: ACS ?demand ischemia  Labs:  Recent Labs  05/28/14 1849  05/29/14 0240 05/29/14 0739 05/29/14 0853 05/29/14 1316 05/29/14 1523 05/29/14 1619 05/29/14 1954 05/30/14 0020  HGB 15.3  --  13.8  --   --   --   --  13.2  --   --   HCT 49.8  --  43.3  --   --   --   --  42.0  --   --   PLT 101*  --  112*  --   --   --   --  100*  --   --   LABPROT  --   --   --   --  14.0  --   --   --   --   --   INR  --   --   --   --  1.07  --   --   --   --   --   HEPARINUNFRC  --   --   --   --   --   --   --  <0.10*  --  0.29*  CREATININE 1.93*  --  2.09*  --   --   --   --  2.37*  --   --   CKTOTAL  --   --   --  272*  --   --   --   --   --   --   TROPONINI  --   < > 0.47* 0.55*  --  0.37* 0.35*  --  <0.30  --   < > = values in this interval not displayed.   Assessment: 80yo male slightly subtherapeutic on heparin with initial dosing for possible ACS.  Goal of Therapy:  Heparin level 0.3-0.7 units/ml   Plan:  Will increase heparin gtt by 1 unit/kg/hr to 1400 units/hr and check level in 8hr.  Vernard Gambles, PharmD, BCPS  05/30/2014,12:51 AM

## 2014-05-30 NOTE — Progress Notes (Signed)
ANTIBIOTIC CONSULT NOTE - INITIAL  Pharmacy Consult:  Levaquin Indication:  CAP  Allergies  Allergen Reactions  . Atorvastatin Other (See Comments)    REACTION: tol simvastatin ok per pt ( Pt. Says ALL the Statins ) Severe muscle weakness    Patient Measurements: Height: 5\' 11"  (180.3 cm) Weight: 273 lb 5.9 oz (124 kg) IBW/kg (Calculated) : 75.3  Vital Signs: Temp: 99.3 F (37.4 C) (11/07 0900) Temp Source: Core (Comment) (11/07 0900) BP: 147/70 mmHg (11/07 0900) Pulse Rate: 140 (11/07 0900) Intake/Output from previous day: 11/06 0701 - 11/07 0700 In: 1135.3 [I.V.:1135.3] Out: 990 [Urine:990] Intake/Output from this shift: Total I/O In: 74.6 [I.V.:44.6; NG/GT:30] Out: 280 [Urine:280]  Labs:  Recent Labs  05/29/14 0240 05/29/14 1619 05/30/14 0300  WBC 7.7 7.7 7.2  HGB 13.8 13.2 13.1  PLT 112* 100* 110*  CREATININE 2.09* 2.37* 2.04*   Estimated Creatinine Clearance: 39.4 mL/min (by C-G formula based on Cr of 2.04). No results for input(s): VANCOTROUGH, VANCOPEAK, VANCORANDOM, GENTTROUGH, GENTPEAK, GENTRANDOM, TOBRATROUGH, TOBRAPEAK, TOBRARND, AMIKACINPEAK, AMIKACINTROU, AMIKACIN in the last 72 hours.   Microbiology: Recent Results (from the past 720 hour(s))  Blood Culture (routine x 2)     Status: None (Preliminary result)   Collection Time: 05/28/14  3:16 PM  Result Value Ref Range Status   Specimen Description BLOOD LEFT ARM  Final   Special Requests BOTTLES DRAWN AEROBIC AND ANAEROBIC 5CC  Final   Culture  Setup Time   Final    05/28/2014 21:15 Performed at [facility]    Culture   Final           BLOOD CULTURE RECEIVED NO GROWTH TO DATE CULTURE WILL BE HELD FOR 5 DAYS BEFORE ISSUING A FINAL NEGATIVE REPORT Performed at [facility]    Report Status PENDING  Incomplete  Blood Culture (routine x 2)     Status: None (Preliminary result)   Collection Time: 05/28/14  3:21 PM  Result Value Ref Range Status   Specimen Description BLOOD  RIGHT ARM  Final   Special Requests BOTTLES DRAWN AEROBIC AND ANAEROBIC 5CC  Final   Culture  Setup Time   Final    05/28/2014 21:15 Performed at [facility]    Culture   Final           BLOOD CULTURE RECEIVED NO GROWTH TO DATE CULTURE WILL BE HELD FOR 5 DAYS BEFORE ISSUING A FINAL NEGATIVE REPORT Performed at [facility]    Report Status PENDING  Incomplete  Urine culture     Status: None   Collection Time: 05/28/14  4:10 PM  Result Value Ref Range Status   Specimen Description URINE, CATHETERIZED  Final   Special Requests NONE  Final   Culture  Setup Time   Final    05/28/2014 21:52 Performed at [facility]    Colony Count NO GROWTH Performed at [facility]   Final   Culture NO GROWTH Performed at [facility]   Final   Report Status 05/29/2014 FINAL  Final  MRSA PCR Screening     Status: None   Collection Time: 05/28/14  8:31 PM  Result Value Ref Range Status   MRSA by PCR NEGATIVE NEGATIVE Final    Comment:        The GeneXpert MRSA Assay (FDA approved for NASAL specimens only), is one component of a comprehensive MRSA colonization surveillance program. It is not intended to diagnose MRSA infection nor to guide  or monitor treatment for MRSA infections.     Medical History: Past Medical History  Diagnosis Date  . Gout, unspecified     severe dz, "treatment done that last 15 years"  . OBESITY   . RESTLESS LEG SYNDROME   . DETACHMENT, RETINAL NOS 1994  . CATARACT NOS 2008  . HYPERTENSION   . CORONARY ARTERY DISEASE   . PERICARDITIS, ACUTE NEC 2007    MSSA, s/p pericardial window  . GERD   . DEGENERATIVE JOINT DISEASE   . CAROTID ENDARTERECTOMY, LEFT, HX OF   . OBSTRUCTIVE SLEEP APNEA     CPAP qhs  . TIA (transient ischemic attack) 2005  . BPH (benign prostatic hypertrophy)     "minor"  . Lumbar spinal stenosis   . Carotid artery occlusion   . Unspecified venous (peripheral) insufficiency   . Stroke May  2003  . MYOCARDIAL INFARCTION 2004, 03/2010    "minor"  . Anxiety   . Depression   . Shortness of breath       Assessment: 31 YOM previously on Rocephin for rule out UTI, now to start Levaquin for CAP.  Patient with renal dysfunction and reduced UOP.  Difficult to assess exact renal clearance given patient's BMI.  Calculated CrCL ranges from 30-39 ml/min.  CTX 11/6 >> 11/6 LVQ 11/7 >>  11/5 BCx - NGTD 11/5 UCx - negative 11/6 RVP -    Goal of Therapy:  Clearance of infection   Plan:  - Levaquin 750mg  IV Q48H - Monitor renal fxn, clinical progress    Kesley Mullens D. Laney Potash, PharmD, BCPS Pager:  [phone] 05/30/2014, 9:45 AM

## 2014-05-30 NOTE — Progress Notes (Signed)
eLink Physician-Brief Progress Note Patient Name: Camari Travers DOB: [birth year] MRN: [id number]   Date of Service  05/30/2014  HPI/Events of Note   Patient mildly hypoxic.    eICU Interventions   Discussed with RN. She will suction and ensure not biting tube. If no improvement with her intervention will have RT adjust vent settings.       Intervention Category Major Interventions: Hypoxemia - evaluation and management  Kavya Haag R. 05/30/2014, 8:41 PM

## 2014-05-30 NOTE — Progress Notes (Addendum)
PULMONARY / CRITICAL CARE MEDICINE   Name: [name] MRN: [id number] DOB: 1933/07/11    ADMISSION DATE:  05/28/2014 CONSULTATION DATE:  05/28/14  REFERRING MD :  EDP  CHIEF COMPLAINT:  Respiratory failure   INITIAL PRESENTATION: 80 yo M [facility] by EMS on 11/4 after have a change in mental status. Initially heard to be wheezing and had an episode of hypoxemia (82% on room air). Venturi mask was placed with no improvement. He was placed on 15 mL of NRB and sats improved to 96%. Found to be hypercarbic (7.17/99.2/99.0) and hypoxic on NRB, so placed on BiPAP. Failed to improved on BiPAP and was intubated in the ED.   STUDIES:  11/5: intubated  11/5: CXR showing cardiac enlargement and edema.  11/6: CT head no abnormalities  11/6: KUB >> NG in place  11/6: elevated troponin, D-dimer    SUBJECTIVE:   05/30/14: Doing SBT, CAM-ICU negative for delirium but very anxious and restless and trying to self extubate. Has SVT; HR 135. Noted that he is on CNS polypharmacy with lyrica, percocet, remeron and requip  VITAL SIGNS: Temp:  [98.8 F (37.1 C)-100.7 F (38.2 C)] 98.9 F (37.2 C) (11/07 0800) Pulse Rate:  [54-106] 106 (11/07 0800) Resp:  [16-25] 25 (11/07 0800) BP: (84-177)/(33-140) 143/71 mmHg (11/07 0800) SpO2:  [89 %-100 %] 89 % (11/07 0800) FiO2 (%):  [40 %] 40 % (11/07 0800) Weight:  [124 kg (273 lb 5.9 oz)] 124 kg (273 lb 5.9 oz) (11/07 0500) HEMODYNAMICS: CVP:  [9 mmHg-18 mmHg] 12 mmHg VENTILATOR SETTINGS: Vent Mode:  [-] PSV;CPAP FiO2 (%):  [40 %] 40 % Set Rate:  [20 bmp] 20 bmp Vt Set:  [600 mL] 600 mL PEEP:  [5 cmH20] 5 cmH20 Pressure Support:  [5 cmH20] 5 cmH20 Plateau Pressure:  [27 cmH20-42 cmH20] 27 cmH20 INTAKE / OUTPUT:  Intake/Output Summary (Last 24 hours) at 05/30/14 0913 Last data filed at 05/30/14 0800  Gross per 24 hour  Intake 1108.42 ml  Output   1170 ml  Net -61.58 ml    PHYSICAL EXAMINATION: General: Obese male, laying in bed   Psych: appears anxious Neuro: Very restless, RAS +2 to +3 but CAM-ICU negative for delirium HEENT: right pupil pinpoint, left pupil 4 mm  Cardiovascular:SVT + HR 130 S1S2, no murmurs  Lungs: Breaths sounds heard b/l, no wheezing  Abdomen: Distended, +BS,  Musculoskeletal: +2 pitting edema b/l to proximal tibia, 4 cm x 4 cm pre-tibial mass (reported as chronic from his gout)  Skin: scattered abrasions on b/l anterior shins b/l   LABS: PULMONARY  Recent Labs Lab 05/28/14 1621 05/28/14 1949 05/28/14 2124 05/28/14 2337 05/29/14 0850  PHART 7.177* 7.224* 7.323*  --  7.412  PCO2ART 99.2* 84.3* 51.3*  --  47.6*  PO2ART 99.0 282.0* 239.0*  --  79.0*  HCO3 36.7* 36.0* 25.9*  --  30.1*  TCO2 40 39 27.4  --  32  O2SAT 95.0 100.0 99.3 61.2 95.0    CBC  Recent Labs Lab 05/29/14 0240 05/29/14 1619 05/30/14 0300  HGB 13.8 13.2 13.1  HCT 43.3 42.0 41.2  WBC 7.7 7.7 7.2  PLT 112* 100* 110*    COAGULATION  Recent Labs Lab 05/29/14 0853  INR 1.07    CARDIAC    Recent Labs Lab 05/29/14 0739 05/29/14 1316 05/29/14 1523 05/29/14 1954 05/30/14 0300  TROPONINI 0.55* 0.37* 0.35* <0.30 <0.30    Recent Labs Lab 05/28/14 1508  PROBNP 477.3*  CHEMISTRY  Recent Labs Lab 05/28/14 1508 05/28/14 1849 05/29/14 0240 05/29/14 1619 05/30/14 0300  NA 141  --  141 140 139  K 4.1  --  3.7 4.2 3.9  CL 96  --  97 100 100  CO2 33*  --  29 28 28   GLUCOSE 118*  --  111* 108* 105*  BUN 42*  --  47* 49* 47*  CREATININE 1.74* 1.93* 2.09* 2.37* 2.04*  CALCIUM 9.0  --  8.7 8.5 8.7  MG  --  2.3 1.9  --  2.1  PHOS  --   --  3.2  --  2.3   Estimated Creatinine Clearance: 39.4 mL/min (by C-G formula based on Cr of 2.04).   LIVER  Recent Labs Lab 05/29/14 0853  INR 1.07     INFECTIOUS  Recent Labs Lab 05/28/14 1829 05/28/14 2124 05/29/14 0240 05/29/14 0242 05/29/14 1500 05/30/14 0300  LATICACIDVEN 0.88  --   --  2.3* 1.6  --   PROCALCITON  --   0.16 0.29  --   --  0.37     ENDOCRINE CBG (last 3)   Recent Labs  05/28/14 2039  GLUCAP 247*    IMAGING x48h Ct Head Wo Contrast  05/29/2014   CLINICAL DATA:  Admitted for COPD exacerbation. Now altered mental status with neurological changes.  EXAM: CT HEAD WITHOUT CONTRAST  TECHNIQUE: Contiguous axial images were obtained from the base of the skull through the vertex without intravenous contrast.  COMPARISON:  03/20/2014  FINDINGS: Diffuse cerebral atrophy. Low-attenuation changes in the deep white matter consistent with small vessel ischemia. No mass effect or midline shift. No abnormal extra-axial fluid collections. Gray-white matter junctions are distinct. Basal cisterns are not effaced. No evidence of acute intracranial hemorrhage. No depressed skull fractures. Postoperative changes in the left globe. Opacification of some of the ethmoid air cells. Vascular calcifications. Endotracheal and enteric tubes are in place.  IMPRESSION: No acute intracranial abnormalities. Chronic atrophy and small vessel ischemic changes.   Electronically Signed   By: Burman Nieves M.D.   On: 05/29/2014 01:49   Dg Chest Port 1 View  05/30/2014   CLINICAL DATA:  Acute respiratory failure.  EXAM: PORTABLE CHEST - 1 VIEW  COMPARISON:  05/29/2014  FINDINGS: Endotracheal tube tip terminates between the clavicular heads and carina. Gastric suction tube reaches the stomach. Left IJ catheter, tip near the upper SVC.  Stable heart size and mediastinal contours. There is improved pulmonary inflation with persistent left perihilar opacification. No edema, effusion, or pneumothorax.  IMPRESSION: 1. Unchanged positioning of tubes and central line. 2. Improved lung aeration with residual perihilar atelectasis on the left.   Electronically Signed   By: Tiburcio Pea M.D.   On: 05/30/2014 07:41   Dg Chest Port 1 View  05/29/2014   CLINICAL DATA:  Respiratory failure.  EXAM: PORTABLE CHEST - 1 VIEW  COMPARISON:  05/28/2014   FINDINGS: Endotracheal tube is in place, estimated to be approximately 4.1 cm above carina. Nasogastric tube is in place with tip overlying the level of the proximal stomach. Left IJ central line tip overlies the brachiocephalic -SVC confluence.  Heart is enlarged. There is mild interstitial edema. Dense opacity is identified at the medial lung bases.  IMPRESSION: 1. Lines and tubes as described. 2. Cardiomegaly and mild edema. 3. Dense opacity at the medial lung bases consistent with consolidation or atelectasis.   Electronically Signed   By: Rosalie Gums M.D.   On: 05/29/2014 07:13  Dg Chest Port 1 View  05/28/2014   CLINICAL DATA:  Central line placement  EXAM: PORTABLE CHEST - 1 VIEW  COMPARISON:  05/28/2014  FINDINGS: Endotracheal tube tip measures 3.8 cm above the carinal. Enteric tube tip is off the field of view but below the left hemidiaphragm. Interval placement of a left central venous catheter. The tip is directed horizontally over the mid SVC region consistent with location of the junction of the brachiocephalic and superior vena cava. No pneumothorax. Shallow inspiration. Cardiac enlargement. Fluid in the right major fissure. Patchy infiltration in the left perihilar region. No blunting of costophrenic angles.  IMPRESSION: Left central venous catheter tip directed horizontally in the region of the junction of the brachiocephalic vein and SVC. No pneumothorax. Patchy perihilar infiltration on the left. Shallow inspiration.   Electronically Signed   By: Burman Nieves M.D.   On: 05/28/2014 22:56   Dg Chest Portable 1 View  05/28/2014   CLINICAL DATA:  Shortness of breath.  Endotracheal tube placement.  EXAM: PORTABLE CHEST - 1 VIEW 7:54 p.m.  COMPARISON:  05/28/2014 at 3:51 p.m. and 04/21/2014  FINDINGS: Endotracheal tube has been inserted and the tip is in good position 2.8 cm above the carina. Pulmonary edema has resolved on the right and is much improved on the left. There is slight  atelectasis in the left midzone the left base medially. Small amount of fluid along the right minor fissure. Tremors thoracic aorta.  IMPRESSION: Endotracheal tube in good position.  Improving pulmonary edema.  Slight atelectasis on the left.   Electronically Signed   By: Geanie Cooley M.D.   On: 05/28/2014 20:39   Dg Chest Port 1 View  05/28/2014   CLINICAL DATA:  Shortness of breath for 1 day  EXAM: PORTABLE CHEST - 1 VIEW  COMPARISON:  04/21/2014  FINDINGS: Diminished exam detail secondary to patient's body habitus, portable technique and low lung volumes. Cardiac enlargement noted. The lung volumes are low. Mild pulmonary edema is identified bilaterally. No airspace consolidation.  IMPRESSION: 1. Suboptimal exam. 2. Cardiac enlargement and edema.   Electronically Signed   By: Signa Kell M.D.   On: 05/28/2014 16:03   Dg Abd Portable 1v  05/28/2014   CLINICAL DATA:  Evaluate nasogastric tube placement.Encounter for orogastric (OG) tube placement Z46.59 (ICD-10-CM)  EXAM: PORTABLE ABDOMEN - 1 VIEW  COMPARISON:  05/28/2014  FINDINGS: Again noted is a gas filled stomach. Nasogastric tube tip in the region of the proximal stomach. Abdominal bowel gas pattern is nonspecific with minimal gas.  IMPRESSION: Nasogastric tube in the proximal stomach region.   Electronically Signed   By: Richarda Overlie M.D.   On: 05/28/2014 22:57   Dg Abd Portable 1v  05/28/2014   CLINICAL DATA:  Abdominal distention  EXAM: PORTABLE ABDOMEN - 1 VIEW  COMPARISON:  03/20/2014  FINDINGS: Study limited by portable technique and motion degradation.  There is mild to moderate gaseous distention of the stomach. The overall bowel gas pattern is nonobstructive. No concerning mass effect or calcification in the abdomen. Basilar lung opacities, recently evaluated by chest x-ray.  IMPRESSION: Nonobstructive bowel gas pattern.   Electronically Signed   By: Tiburcio Pea M.D.   On: 05/28/2014 21:32     ASSESSMENT / PLAN:  PULMONARY OETT  11/4>> D-dimer: 1.13  A: Acute Respiratory failure  Acute on Chronic hypercarbia  OSA   -  Doing SBT but appears too anxious and with SVT to allow for safe extubation. Actually failed  SBT byt 9.30am   P:  SBT as tolerated Hold off extubation till anxiety and SVT cotnrolled Full mechanical ventilation  otherwise Trend ABG prn  CARDIOVASCULAR A: Hx of ASCAD w/ prior PCI of left circ,  Hx of HTN, on home lopressors Hx of  acute staph pericarditis w/ pericardial window in 2007, MI, and PVD s/p L CEA  Hx of Moderate AS  - @  admission: bradycardic and in shock needing dopamine./levophed Trop elevation c/w demand ischemia  - Currently: blood pressure hypertensive currently 05/30/14; off pressors this AM and in SVT. Possible he took too much loperssor at home to account for admission picture of bradycardia and shock   P:  Heparin drip  Holding Plavix, ASA  RESTART Metoprolol but as IV CVC for CVP monitoring Cards folowing  RENAL A: CKD stage IV (Cr 1.6 baseline)  BPH on home -  Flomax, lasix, myrbetriq   - AKI at admit, improving slowly 05/29/14  P:  Monitor Holding home meds Restart lasix at IV  GASTROINTESTINAL A: Abdominal distention Reported constipation  GERD  P:   protonix  NS KVO  NPO ; tube feeds 05/30/14 if still intubated   HEMATOLOGIC A: VTE Ppx  Thrombocytopenia  Hx of Gout  P:  SCD's  Holding home allopurinol   INFECTIOUS PCT >> 0.29 BCx2 11/05 UC 11/05 Sputum 11/05  A: SIRS on admission (hypothermic and tachypnea)  Hx of repeated UTI's   - 05/30/14: curently off abx but so far no etilogy other than bacterial nos. PCT profile not inconsistent with localized bacterial ifneftion. Likely pulm etiologut  P:  Start Levaquin  Await Resp virus panel Check urine leg and strep  ENDOCRINE TSH >2.70 A: No acute issues P:   NEUROLOGIC A:  Chronic spinal stenosis w/ chronic back pain: on lyrica and  percocet Hx of TIA  Hx of Restless leg syndrome : on ativan, requip, remeron   - On 05/30/14: CAM negagive for delirium but extremely anxious and restless  P:  Change fent gtt to precedex (will help SVT) Start Ativan prn Restart home requip, remerron, lyrica (dose of lyrica renal adjusted) Fent prn- back to gtt if needed RASS goal: 0 WUA daily    FAMILY  - Updates: No family at bedside   - Inter-disciplinary family meet or Palliative Care meeting due by:  11/12    TODAY'S SUMMARY: see notes    The patient is critically ill with multiple organ systems failure and requires high complexity decision making for assessment and support, frequent evaluation and titration of therapies, application of advanced monitoring technologies and extensive interpretation of multiple databases.   Critical Care Time devoted to patient care services described in this note is  30  Minutes. This time reflects time of care of this signee Dr Kalman Shan. This critical care time does not reflect procedure time, or teaching time or supervisory time of PA/NP/Med student/Med Resident etc but could involve care discussion time    Dr. Kalman Shan, M.D., [facility].C.P Pulmonary and Critical Care Medicine Staff Physician [facility] [place] Pager: [phone], If no answer or between  15:00h - 7:00h: call [phone]  05/30/2014 9:32 AM

## 2014-05-30 NOTE — Progress Notes (Signed)
eLink Physician-Brief Progress Note Patient Name: [name] DOB: 1932-12-13 MRN: [id number]   Date of Service  05/30/2014  HPI/Events of Note   Hematuria and bloody secretions from ETT. Was on heparin GTT for possible ACS. Trops now normal.   eICU Interventions    Heparin DCed      Intervention Category Intermediate Interventions: OtherCurt Bears, Nolen Lindamood R. 05/30/2014, 7:14 PM

## 2014-05-30 NOTE — Progress Notes (Signed)
ANTICOAGULATION CONSULT NOTE - Follow Up Consult  Pharmacy Consult for Heparin Indication: chest pain/ACS  Allergies  Allergen Reactions  . Atorvastatin Other (See Comments)    REACTION: tol simvastatin ok per pt ( Pt. Says ALL the Statins ) Severe muscle weakness    Patient Measurements: Height: 5\' 11"  (180.3 cm) Weight: 273 lb 5.9 oz (124 kg) IBW/kg (Calculated) : 75.3 Heparin Dosing Weight: 103 kg  Vital Signs: Temp: 98.4 F (36.9 C) (11/07 1300) Temp Source: Core (Comment) (11/07 1300) BP: 112/48 mmHg (11/07 1300) Pulse Rate: 95 (11/07 1300)  Labs:  Recent Labs  05/29/14 0240 05/29/14 0739 05/29/14 0853  05/29/14 1523 05/29/14 1619 05/29/14 1954 05/30/14 0020 05/30/14 0300 05/30/14 0807 05/30/14 1250  HGB 13.8  --   --   --   --  13.2  --   --  13.1  --  13.4  HCT 43.3  --   --   --   --  42.0  --   --  41.2  --  42.6  PLT 112*  --   --   --   --  100*  --   --  110*  --  101*  LABPROT  --   --  14.0  --   --   --   --   --   --   --   --   INR  --   --  1.07  --   --   --   --   --   --   --   --   HEPARINUNFRC  --   --   --   --   --  <0.10*  --  0.29*  --  0.51  --   CREATININE 2.09*  --   --   --   --  2.37*  --   --  2.04*  --   --   CKTOTAL  --  272*  --   --   --   --   --   --   --   --   --   TROPONINI 0.47* 0.55*  --   < > 0.35*  --  <0.30  --  <0.30  --   --   < > = values in this interval not displayed.  Estimated Creatinine Clearance: 39.4 mL/min (by C-G formula based on Cr of 2.04).   Medications:  Infusions:  . dexmedetomidine 0.4 mcg/kg/hr (05/30/14 1300)  . heparin 1,400 Units/hr (05/30/14 1300)    Assessment: 80 year old male on anticoagulation with Heparin for possible demand ischemia.  His heparin level was therapeutic after rate adjustment this morning.  Note he lost IV access temporarily and his heparin was resumed at another IV site.  Given his heparin level was therapeutic no rate adjustment is required.  Goal of Therapy:   Heparin level 0.3-0.7 units/ml Monitor platelets by anticoagulation protocol: Yes   Plan:  Continue Heparin at 1400 units/hr Daily heparin level and CBC  Estella Husk, Pharm.D., BCPS, AAHIVP Clinical Pharmacist Phone: [phone] or [phone] 05/30/2014, 2:56 PM

## 2014-05-31 ENCOUNTER — Inpatient Hospital Stay (HOSPITAL_COMMUNITY): Payer: Medicare Other

## 2014-05-31 LAB — BASIC METABOLIC PANEL
ANION GAP: 16 — AB (ref 5–15)
BUN: 38 mg/dL — AB (ref 6–23)
CALCIUM: 9.3 mg/dL (ref 8.4–10.5)
CO2: 26 mEq/L (ref 19–32)
CREATININE: 1.48 mg/dL — AB (ref 0.50–1.35)
Chloride: 101 mEq/L (ref 96–112)
GFR calc Af Amer: 50 mL/min — ABNORMAL LOW (ref >90)
GFR calc non Af Amer: 43 mL/min — ABNORMAL LOW (ref >90)
Glucose, Bld: 125 mg/dL — ABNORMAL HIGH (ref 70–99)
Potassium: 3.7 mEq/L (ref 3.7–5.3)
Sodium: 143 mEq/L (ref 137–147)

## 2014-05-31 LAB — MAGNESIUM: MAGNESIUM: 1.9 mg/dL (ref 1.5–2.5)

## 2014-05-31 LAB — CBC
HCT: 42.1 % (ref 39.0–52.0)
Hemoglobin: 13.4 g/dL (ref 13.0–17.0)
MCH: 29.1 pg (ref 26.0–34.0)
MCHC: 31.8 g/dL (ref 30.0–36.0)
MCV: 91.3 fL (ref 78.0–100.0)
Platelets: 88 10*3/uL — ABNORMAL LOW (ref 150–400)
RBC: 4.61 MIL/uL (ref 4.22–5.81)
RDW: 15.7 % — AB (ref 11.5–15.5)
WBC: 6.4 10*3/uL (ref 4.0–10.5)

## 2014-05-31 LAB — PHOSPHORUS: PHOSPHORUS: 1.8 mg/dL — AB (ref 2.3–4.6)

## 2014-05-31 LAB — LACTIC ACID, PLASMA: Lactic Acid, Venous: 1.4 mmol/L (ref 0.5–2.2)

## 2014-05-31 MED ORDER — PANTOPRAZOLE SODIUM 40 MG PO PACK
40.0000 mg | PACK | Freq: Every day | ORAL | Status: DC
  Filled 2014-05-31 (×2): qty 20

## 2014-05-31 MED ORDER — HYDRALAZINE HCL 20 MG/ML IJ SOLN
10.0000 mg | INTRAMUSCULAR | Status: DC | PRN
  Administered 2014-05-31: 10 mg via INTRAVENOUS
  Filled 2014-05-31: qty 1

## 2014-05-31 MED ORDER — CHLORHEXIDINE GLUCONATE 0.12 % MT SOLN
15.0000 mL | Freq: Two times a day (BID) | OROMUCOSAL | Status: DC
  Administered 2014-05-31 – 2014-06-05 (×8): 15 mL via OROMUCOSAL
  Filled 2014-05-31 (×12): qty 15

## 2014-05-31 MED ORDER — FENTANYL CITRATE 0.05 MG/ML IJ SOLN
INTRAMUSCULAR | Status: AC
  Administered 2014-05-31: 50 ug via INTRAVENOUS
  Filled 2014-05-31: qty 2

## 2014-05-31 MED ORDER — METOPROLOL TARTRATE 25 MG/10 ML ORAL SUSPENSION
25.0000 mg | Freq: Two times a day (BID) | ORAL | Status: DC
  Filled 2014-05-31 (×2): qty 10

## 2014-05-31 MED ORDER — MAGNESIUM SULFATE 2 GM/50ML IV SOLN
2.0000 g | Freq: Once | INTRAVENOUS | Status: AC
  Administered 2014-05-31: 2 g via INTRAVENOUS
  Filled 2014-05-31: qty 50

## 2014-05-31 MED ORDER — DEXMEDETOMIDINE HCL IN NACL 200 MCG/50ML IV SOLN
0.0000 ug/kg/h | INTRAVENOUS | Status: DC
  Administered 2014-05-31: 0.6 ug/kg/h via INTRAVENOUS
  Filled 2014-05-31: qty 50

## 2014-05-31 MED ORDER — POTASSIUM PHOSPHATES 15 MMOLE/5ML IV SOLN
20.0000 mmol | Freq: Once | INTRAVENOUS | Status: AC
  Administered 2014-05-31: 20 mmol via INTRAVENOUS
  Filled 2014-05-31: qty 6.67

## 2014-05-31 MED ORDER — FENTANYL CITRATE 0.05 MG/ML IJ SOLN
25.0000 ug | INTRAMUSCULAR | Status: DC | PRN
  Administered 2014-05-31 – 2014-06-01 (×5): 50 ug via INTRAVENOUS
  Filled 2014-05-31 (×4): qty 2

## 2014-05-31 MED ORDER — CETYLPYRIDINIUM CHLORIDE 0.05 % MT LIQD
7.0000 mL | Freq: Two times a day (BID) | OROMUCOSAL | Status: DC
  Administered 2014-05-31 – 2014-06-04 (×8): 7 mL via OROMUCOSAL

## 2014-05-31 NOTE — Plan of Care (Signed)
Problem: Phase I Progression Outcomes Goal: GIProphysixis Outcome: Completed/Met Date Met:  05/31/14

## 2014-05-31 NOTE — Progress Notes (Signed)
Subjective:  Able to be extubated today.  Clam and not SOB.  Says he had a cough prior to coming in but no other clues as to decompensation  Objective:  Vital Signs in the last 24 hours: BP 128/56 mmHg  Pulse 89  Temp(Src) 98 F (36.7 C) (Core (Comment))  Resp 24  Ht 5\' 11"  (1.803 m)  Wt 112.8 kg (248 lb 10.9 oz)  BMI 34.70 kg/m2  SpO2 92%  Physical Exam: Obese plethoric WMcalm and in NAD Lungs:  Relatively clear now Cardiac: Regular rhythm, normal S1 and S2, no S3 2-3/6 harsh systolic murmur. Extremities:  1+edema present  Intake/Output from previous day: 11/07 0701 - 11/08 0700 In: 1008.7 [I.V.:638.7; NG/GT:220; IV Piggyback:150] Out: 3780 [Urine:3780]  Weight Filed Weights   05/30/14 0500 05/30/14 1900 05/31/14 0407  Weight: 124 kg (273 lb 5.9 oz) 113.7 kg (250 lb 10.6 oz) 112.8 kg (248 lb 10.9 oz)    Lab Results: Basic Metabolic Panel:  Recent Labs  02/40/97 0300 05/31/14 0446  NA 139 143  K 3.9 3.7  CL 100 101  CO2 28 26  GLUCOSE 105* 125*  BUN 47* 38*  CREATININE 2.04* 1.48*   CBC:  Recent Labs  05/28/14 1508  05/30/14 1250 05/31/14 0446  WBC 5.6  < > 7.1 6.4  NEUTROABS 2.7  --   --   --   HGB 14.5  < > 13.4 13.4  HCT 46.4  < > 42.6 42.1  MCV 93.5  < > 92.4 91.3  PLT 103*  < > 101* 88*  < > = values in this interval not displayed. Cardiac Enzymes:  Recent Labs  05/29/14 0739  05/29/14 1523 05/29/14 1954 05/30/14 0300  CKTOTAL 272*  --   --   --   --   TROPONINI 0.55*  < > 0.35* <0.30 <0.30  < > = values in this interval not displayed.  Telemetry: Sinus rhythm  Assessment/Plan:  1. Acute respiratory failure undetermined type 2. Hypotension resolved 3. Moderate aortic stenosis 4. Acute renal failure improving 5. CAD with prior stent 6. Low level elevation of troponin c/w demand ischemia 7. Thrombocytopenia  Rec:  Restart home meds and follow. Clinically better.  NOt sure what reason for respiratory arrest was at this time.   Suspect bradycardia was due to the respiratory issues. Monitor heart rate on restart beta blocker.  Darden Palmer  MD [facility]  05/31/2014, 12:08 PM

## 2014-05-31 NOTE — Procedures (Signed)
Extubation Procedure Note  Patient Details:   Name: [name] DOB: 1932-12-03 MRN: [id number]   Airway Documentation:     Evaluation  O2 sats: stable throughout Complications: No apparent complications Patient did tolerate procedure well. Bilateral Breath Sounds: Clear Suctioning: Airway Yes   Patient extubated to 6L nasal cannula.  Positive cuff leak noted.  Sats currently 94%.  Vitals are stable.  Incentive spirometry performed x5 with achieved goal of 1000.  No evidence of stridor.    Durwin Glaze 05/31/2014, 9:48 AM

## 2014-05-31 NOTE — Progress Notes (Signed)
PULMONARY / CRITICAL CARE MEDICINE   Name: [name] MRN: [id number] DOB: 01-24-1933    ADMISSION DATE:  05/28/2014 CONSULTATION DATE:  05/28/14  REFERRING MD :  EDP  CHIEF COMPLAINT:  Respiratory failure   INITIAL PRESENTATION: 80 yo M brought to [facility] ED by EMS on 11/4 after have a change in mental status. Initially heard to be wheezing and had an episode of hypoxemia (82% on room air). Venturi mask was placed with no improvement. He was placed on 15 mL of NRB and sats improved to 96%. Found to be hypercarbic (7.17/99.2/99.0) and hypoxic on NRB, so placed on BiPAP. Failed to improved on BiPAP and was intubated in the ED.   STUDIES:  11/5: intubated  11/5: CXR showing cardiac enlargement and edema.  11/6: CT head no abnormalities  11/6: KUB >> NG in place  11/6: elevated troponin, D-dimer    SUBJECTIVE:   05/31/14:Sedated , vent rate 20, decreased to 16, decrease sedation and attempt to wean.  VITAL SIGNS: Temp:  [97.4 F (36.3 C)-99.6 F (37.6 C)] 98.1 F (36.7 C) (11/08 0600) Pulse Rate:  [38-141] 59 (11/08 0600) Resp:  [16-26] 20 (11/08 0600) BP: (110-177)/(47-72) 165/56 mmHg (11/08 0600) SpO2:  [72 %-100 %] 95 % (11/08 0600) FiO2 (%):  [40 %-60 %] 40 % (11/08 0400) Weight:  [248 lb 10.9 oz (112.8 kg)-250 lb 10.6 oz (113.7 kg)] 248 lb 10.9 oz (112.8 kg) (11/08 0407) HEMODYNAMICS: CVP:  [14 mmHg] 14 mmHg VENTILATOR SETTINGS: Vent Mode:  [-] PRVC FiO2 (%):  [40 %-60 %] 40 % Set Rate:  [20 bmp] 20 bmp Vt Set:  [600 mL] 600 mL PEEP:  [5 cmH20] 5 cmH20 Pressure Support:  [5 cmH20] 5 cmH20 Plateau Pressure:  [22 cmH20-26 cmH20] 22 cmH20 INTAKE / OUTPUT:  Intake/Output Summary (Last 24 hours) at 05/31/14 0818 Last data filed at 05/31/14 0600  Gross per 24 hour  Intake 918.19 ml  Output   3500 ml  Net -2581.81 ml    PHYSICAL EXAMINATION: General: Obese male, laying in bed , sedated Psych: sedated   onprecedex Neuro: rass - 1 HEENT: right pupil pinpoint,  left pupil 4 mm  Cardiovascular:SR + HR 87S1S2, 3/6 sem Lungs: Breaths sounds heard b/l, no wheezing  Abdomen: Distended, +BS, TF running Musculoskeletal: +2 pitting edema b/l to proximal tibia, 4 cm x 4 cm pre-tibial mass (reported as chronic from his gout)  Skin: scattered abrasions on b/l anterior shins b/l   LABS: PULMONARY  Recent Labs Lab 05/28/14 1621 05/28/14 1949 05/28/14 2124 05/28/14 2337 05/29/14 0850  PHART 7.177* 7.224* 7.323*  --  7.412  PCO2ART 99.2* 84.3* 51.3*  --  47.6*  PO2ART 99.0 282.0* 239.0*  --  79.0*  HCO3 36.7* 36.0* 25.9*  --  30.1*  TCO2 40 39 27.4  --  32  O2SAT 95.0 100.0 99.3 61.2 95.0    CBC  Recent Labs Lab 05/30/14 0300 05/30/14 1250 05/31/14 0446  HGB 13.1 13.4 13.4  HCT 41.2 42.6 42.1  WBC 7.2 7.1 6.4  PLT 110* 101* 88*    COAGULATION  Recent Labs Lab 05/29/14 0853  INR 1.07    CARDIAC    Recent Labs Lab 05/29/14 0739 05/29/14 1316 05/29/14 1523 05/29/14 1954 05/30/14 0300  TROPONINI 0.55* 0.37* 0.35* <0.30 <0.30    Recent Labs Lab 05/28/14 1508  PROBNP 477.3*     CHEMISTRY  Recent Labs Lab 05/28/14 1508 05/28/14 1849 05/29/14 0240 05/29/14 1619 05/30/14 0300 05/31/14 0446  NA  141  --  141 140 139 143  K 4.1  --  3.7 4.2 3.9 3.7  CL 96  --  97 100 100 101  CO2 33*  --  29 28 28 26   GLUCOSE 118*  --  111* 108* 105* 125*  BUN 42*  --  47* 49* 47* 38*  CREATININE 1.74* 1.93* 2.09* 2.37* 2.04* 1.48*  CALCIUM 9.0  --  8.7 8.5 8.7 9.3  MG  --  2.3 1.9  --  2.1 1.9  PHOS  --   --  3.2  --  2.3 1.8*   Estimated Creatinine Clearance: 51.7 mL/min (by C-G formula based on Cr of 1.48).   LIVER  Recent Labs Lab 05/29/14 0853  INR 1.07     INFECTIOUS  Recent Labs Lab 05/28/14 2124 05/29/14 0240 05/29/14 0242 05/29/14 1500 05/30/14 0300 05/31/14  LATICACIDVEN  --   --  2.3* 1.6  --  1.4  PROCALCITON 0.16 0.29  --   --  0.37  --      ENDOCRINE CBG (last 3)   Recent Labs   05/28/14 2039  GLUCAP 247*    IMAGING x48h Dg Chest Port 1 View  05/31/2014   CLINICAL DATA:  Acute respiratory failure  EXAM: PORTABLE CHEST - 1 VIEW  COMPARISON:  05/30/2014  FINDINGS: Endotracheal and nasogastric tubes are appropriately positioned. Left IJ central venous catheter is noted with tip over the brachiocephalic/SVC confluence. Trace pleural effusions are noted bilaterally. Lungs are hypoaerated with increased areas of linear bibasilar presumed atelectasis.  IMPRESSION: Hypoaeration with increased bibasilar atelectasis.   Electronically Signed   By: Christiana PellantGretchen  Green M.D.   On: 05/31/2014 08:02   Dg Chest Port 1 View  05/30/2014   CLINICAL DATA:  Acute respiratory failure.  EXAM: PORTABLE CHEST - 1 VIEW  COMPARISON:  05/29/2014  FINDINGS: Endotracheal tube tip terminates between the clavicular heads and carina. Gastric suction tube reaches the stomach. Left IJ catheter, tip near the upper SVC.  Stable heart size and mediastinal contours. There is improved pulmonary inflation with persistent left perihilar opacification. No edema, effusion, or pneumothorax.  IMPRESSION: 1. Unchanged positioning of tubes and central line. 2. Improved lung aeration with residual perihilar atelectasis on the left.   Electronically Signed   By: Tiburcio PeaJonathan  Watts M.D.   On: 05/30/2014 07:41     ASSESSMENT / PLAN:  PULMONARY OETT 11/4>>   A: Acute Respiratory failure  Acute on Chronic hypercarbia  OSA   -  Doing SBT , now on precedex  P:  SBT as tolerated Hold off extubation till anxiety and SVT cotnrolled Full mechanical ventilation  otherwise Trend ABG prn Diuresis as tolerated  CARDIOVASCULAR A: Hx of ASCAD w/ prior PCI of left circ,  Hx of HTN, on home lopressors Hx of  acute staph pericarditis w/ pericardial window in 2007, MI, and PVD s/p L CEA  Hx of Moderate AS  - @  admission: bradycardic and in shock needing dopamine./levophed Trop elevation c/w demand ischemia  -  Currently: blood pressure hypertensive currently 05/31/14; off pressors this x  48 hrs and in SR. Possible he took too much loperssor at home to account for admission picture of bradycardia and shock   P:  Heparin drip  Holding Plavix, ASA  RESTART Metoprolol per tube at5 half home dose CVC for CVP monitoring Cards folowing  RENAL Lab Results  Component Value Date   CREATININE 1.48* 05/31/2014   CREATININE 2.04* 05/30/2014   CREATININE  2.37* 05/29/2014    A: CKD stage IV (Cr 1.6 baseline)  BPH on home -  Flomax, lasix, myrbetriq   - AKI at admit, improving   P:  Monitor Holding home meds Restart lasix at IV  GASTROINTESTINAL A: Abdominal distention Reported constipation  GERD  P:   protonix  NS KVO  NPO ; tube feeds 05/30/14    HEMATOLOGIC A: VTE Ppx  Thrombocytopenia  Hx of Gout  P:  SCD's  Holding home allopurinol   INFECTIOUS PCT >> 0.29 BCx2 11/05 UC 11/05>>neg Sputum 11/05 not done 11/6 sterp/legion>>neg  A: SIRS on admission (hypothermic and tachypnea)  Hx of repeated UTI's   - 05/30/14: curently off abx but so far no etilogy other than bacterial nos. PCT profile not inconsistent with localized bacterial ifneftion. Likely pulm etiology 11/8 on Levaquin per MD P:  11/7 Levaquin day 1/x 11/6 Resp virus panel>>  urine leg and strep both negative  ENDOCRINE TSH >2.70 A: No acute issues P:   NEUROLOGIC A:  Chronic spinal stenosis w/ chronic back pain: on lyrica and percocet Hx of TIA  Hx of Restless leg syndrome : on ativan, requip, remeron   - On 05/30/14: CAM negagive for delirium but extremely anxious and restless - 11/8 sedated on vent P:  Change fent gtt to precedex (will help SVT) decrease dose 11/8 Start Ativan prn Restart home requip, remerron, lyrica (dose of lyrica renal adjusted) Fent prn- back to gtt if needed RASS goal: 0 WUA daily    FAMILY  - Updates: No family at bedside   -  Inter-disciplinary family meet or Palliative Care meeting due by:  11/12    TODAY'S SUMMARY:  Decreased RR on vent, decrease sedation, wean as tolerated.  Brett Canales Minor ACNP [facility] Pager [phone] till 3 pm If no answer page [phone] 05/31/2014, 8:19 AM

## 2014-05-31 NOTE — Progress Notes (Signed)
RT Note: Placed pt on Auto titrate CPAP w FFM per home use w/ 6L O2 bleed in. Pt tolerating well at this time. RT will continue to monitor

## 2014-06-01 ENCOUNTER — Inpatient Hospital Stay (HOSPITAL_COMMUNITY): Payer: Medicare Other

## 2014-06-01 LAB — MAGNESIUM: MAGNESIUM: 2 mg/dL (ref 1.5–2.5)

## 2014-06-01 LAB — RESPIRATORY VIRUS PANEL
Adenovirus: NOT DETECTED
INFLUENZA A H1: NOT DETECTED
INFLUENZA A: NOT DETECTED
INFLUENZA B 1: NOT DETECTED
Influenza A H3: NOT DETECTED
METAPNEUMOVIRUS: NOT DETECTED
PARAINFLUENZA 1 A: NOT DETECTED
Parainfluenza 2: NOT DETECTED
Parainfluenza 3: NOT DETECTED
RESPIRATORY SYNCYTIAL VIRUS A: NOT DETECTED
RESPIRATORY SYNCYTIAL VIRUS B: NOT DETECTED
Rhinovirus: DETECTED — AB

## 2014-06-01 LAB — BASIC METABOLIC PANEL
ANION GAP: 14 (ref 5–15)
BUN: 29 mg/dL — AB (ref 6–23)
CO2: 30 mEq/L (ref 19–32)
Calcium: 9.2 mg/dL (ref 8.4–10.5)
Chloride: 102 mEq/L (ref 96–112)
Creatinine, Ser: 1.34 mg/dL (ref 0.50–1.35)
GFR calc non Af Amer: 49 mL/min — ABNORMAL LOW (ref >90)
GFR, EST AFRICAN AMERICAN: 56 mL/min — AB (ref >90)
Glucose, Bld: 91 mg/dL (ref 70–99)
POTASSIUM: 3.8 meq/L (ref 3.7–5.3)
Sodium: 146 mEq/L (ref 137–147)

## 2014-06-01 LAB — CBC
HCT: 43.8 % (ref 39.0–52.0)
Hemoglobin: 13.9 g/dL (ref 13.0–17.0)
MCH: 29.7 pg (ref 26.0–34.0)
MCHC: 31.7 g/dL (ref 30.0–36.0)
MCV: 93.6 fL (ref 78.0–100.0)
PLATELETS: 102 10*3/uL — AB (ref 150–400)
RBC: 4.68 MIL/uL (ref 4.22–5.81)
RDW: 15.7 % — AB (ref 11.5–15.5)
WBC: 7 10*3/uL (ref 4.0–10.5)

## 2014-06-01 LAB — PHOSPHORUS: Phosphorus: 3 mg/dL (ref 2.3–4.6)

## 2014-06-01 MED ORDER — CLOPIDOGREL BISULFATE 75 MG PO TABS
75.0000 mg | ORAL_TABLET | Freq: Every day | ORAL | Status: DC
  Administered 2014-06-01 – 2014-06-05 (×5): 75 mg via ORAL
  Filled 2014-06-01 (×5): qty 1

## 2014-06-01 MED ORDER — OXYCODONE-ACETAMINOPHEN 5-325 MG PO TABS
1.0000 | ORAL_TABLET | Freq: Three times a day (TID) | ORAL | Status: DC | PRN
  Administered 2014-06-01: 2 via ORAL
  Administered 2014-06-02 – 2014-06-05 (×5): 1 via ORAL
  Filled 2014-06-01: qty 1
  Filled 2014-06-01: qty 2
  Filled 2014-06-01: qty 1
  Filled 2014-06-01: qty 2
  Filled 2014-06-01 (×2): qty 1

## 2014-06-01 MED ORDER — BUPROPION HCL ER (XL) 150 MG PO TB24
150.0000 mg | ORAL_TABLET | Freq: Every morning | ORAL | Status: DC
  Administered 2014-06-01 – 2014-06-05 (×5): 150 mg via ORAL
  Filled 2014-06-01 (×5): qty 1

## 2014-06-01 MED ORDER — PANTOPRAZOLE SODIUM 40 MG PO TBEC
40.0000 mg | DELAYED_RELEASE_TABLET | Freq: Every day | ORAL | Status: DC
  Administered 2014-06-01 – 2014-06-04 (×4): 40 mg via ORAL
  Filled 2014-06-01 (×4): qty 1

## 2014-06-01 MED ORDER — TAMSULOSIN HCL 0.4 MG PO CAPS
0.4000 mg | ORAL_CAPSULE | Freq: Every day | ORAL | Status: DC
  Administered 2014-06-01 – 2014-06-05 (×5): 0.4 mg via ORAL
  Filled 2014-06-01 (×5): qty 1

## 2014-06-01 MED ORDER — ASPIRIN EC 81 MG PO TBEC
81.0000 mg | DELAYED_RELEASE_TABLET | Freq: Every day | ORAL | Status: DC
  Administered 2014-06-01 – 2014-06-05 (×5): 81 mg via ORAL
  Filled 2014-06-01 (×5): qty 1

## 2014-06-01 NOTE — Progress Notes (Signed)
Subjective:  Says better.  He is still hoarse, but no c/o SOB or chest pain. Objective:  Vital Signs in the last 24 hours: BP 123/55 mmHg  Pulse 97  Temp(Src) 99.7 F (37.6 C) (Core (Comment))  Resp 14  Ht 5\' 11"  (1.803 m)  Wt 112.8 kg (248 lb 10.9 oz)  BMI 34.70 kg/m2  SpO2 93%  Physical Exam: Obese plethoric WM calm and in NAD Lungs:  Relatively clear now Cardiac: Regular rhythm, normal S1 and S2, no S3 2-3/6 harsh systolic murmur. Extremities:  1+edema present  Intake/Output from previous day: 11/08 0701 - 11/09 0700 In: 763 [I.V.:251; IV Piggyback:512] Out: 3460 [Urine:3460]  Weight Filed Weights   05/30/14 1900 05/31/14 0407 06/01/14 0800  Weight: 113.7 kg (250 lb 10.6 oz) 112.8 kg (248 lb 10.9 oz) 112.8 kg (248 lb 10.9 oz)    Lab Results: Basic Metabolic Panel:  Recent Labs  03/83/33 0446 06/01/14 0200  NA 143 146  K 3.7 3.8  CL 101 102  CO2 26 30  GLUCOSE 125* 91  BUN 38* 29*  CREATININE 1.48* 1.34   CBC:  Recent Labs  05/31/14 0446 06/01/14 0200  WBC 6.4 7.0  HGB 13.4 13.9  HCT 42.1 43.8  MCV 91.3 93.6  PLT 88* 102*   Cardiac Enzymes:  Recent Labs  05/29/14 1523 05/29/14 1954 05/30/14 0300  TROPONINI 0.35* <0.30 <0.30    Telemetry: Sinus rhythm  Assessment/Plan:  1. Acute respiratory failure undetermined type possible viral pneumonia 2. Hypotension resolved 3. Moderate aortic stenosis 4. Acute renal failure improving 5. CAD with prior stent 6. Low level elevation of troponin c/w demand ischemia 7. Thrombocytopenia persisits  Rec:  Being treated for pneumonia.  Renal failure improving and diuresing now. Will follow.  Darden Palmer  MD [facility]  06/01/2014, 9:26 AM

## 2014-06-01 NOTE — Progress Notes (Signed)
Placed pt on CPAP for tonight. Pt tolerating well at this time.

## 2014-06-01 NOTE — Progress Notes (Signed)
Spoke with floor RN, to D/C central line. Ronette Deter, RN

## 2014-06-01 NOTE — Progress Notes (Addendum)
PULMONARY / CRITICAL CARE MEDICINE   Name: [name] MRN: [id number] DOB: Sep 11, 1932    ADMISSION DATE:  05/28/2014 CONSULTATION DATE:  05/28/14  REFERRING MD :  EDP  CHIEF COMPLAINT:  Respiratory failure   INITIAL PRESENTATION: 80 yo M brought to [facility] ED by EMS on 11/4 after have a change in mental status. Initially heard to be wheezing and had an episode of hypoxemia (82% on room air). Venturi mask was placed with no improvement. He was placed on 15 mL of NRB and sats improved to 96%. Found to be hypercarbic (7.17/99.2/99.0) and hypoxic on NRB, so placed on BiPAP. Failed to improved on BiPAP and was intubated in the ED.   STUDIES:  11/5: intubated  11/5: CXR showing cardiac enlargement and edema.  11/6: CT head no abnormalities  11/6: KUB >> NG in place  11/6: elevated troponin, D-dimer   11/8: extubated   SUBJECTIVE:   06/01/14: Sitting up in bed. Feeling better. Awake and alert.   VITAL SIGNS: Temp:  [97.9 F (36.6 C)-100.2 F (37.9 C)] 100 F (37.8 C) (11/09 0700) Pulse Rate:  [59-120] 94 (11/09 0700) Resp:  [8-29] 15 (11/09 0700) BP: (112-147)/(46-88) 123/47 mmHg (11/09 0700) SpO2:  [84 %-100 %] 93 % (11/09 0700) FiO2 (%):  [40 %] 40 % (11/08 0830) HEMODYNAMICS:   VENTILATOR SETTINGS: Vent Mode:  [-] PSV;CPAP FiO2 (%):  [40 %] 40 % PEEP:  [5 cmH20] 5 cmH20 Pressure Support:  [5 cmH20] 5 cmH20 INTAKE / OUTPUT:  Intake/Output Summary (Last 24 hours) at 06/01/14 0720 Last data filed at 06/01/14 0700  Gross per 24 hour  Intake    763 ml  Output   3460 ml  Net  -2697 ml    PHYSICAL EXAMINATION: General: Obese male, laying in bed , sedated Psych: sedated   onprecedex Neuro: rass - 1 HEENT: right pupil pinpoint, left pupil 4 mm  Cardiovascular:SR + HR 87S1S2, 3/6 sem Lungs: Breaths sounds heard b/l, no wheezing  Abdomen: Distended, +BS, TF running Musculoskeletal: +2 pitting edema b/l to proximal tibia, 4 cm x 4 cm pre-tibial mass (reported as  chronic from his gout)  Skin: scattered abrasions on b/l anterior shins b/l   LABS: PULMONARY  Recent Labs Lab 05/28/14 1621 05/28/14 1949 05/28/14 2124 05/28/14 2337 05/29/14 0850  PHART 7.177* 7.224* 7.323*  --  7.412  PCO2ART 99.2* 84.3* 51.3*  --  47.6*  PO2ART 99.0 282.0* 239.0*  --  79.0*  HCO3 36.7* 36.0* 25.9*  --  30.1*  TCO2 40 39 27.4  --  32  O2SAT 95.0 100.0 99.3 61.2 95.0    CBC  Recent Labs Lab 05/30/14 1250 05/31/14 0446 06/01/14 0200  HGB 13.4 13.4 13.9  HCT 42.6 42.1 43.8  WBC 7.1 6.4 7.0  PLT 101* 88* 102*    COAGULATION  Recent Labs Lab 05/29/14 0853  INR 1.07    CARDIAC    Recent Labs Lab 05/29/14 0739 05/29/14 1316 05/29/14 1523 05/29/14 1954 05/30/14 0300  TROPONINI 0.55* 0.37* 0.35* <0.30 <0.30    Recent Labs Lab 05/28/14 1508  PROBNP 477.3*     CHEMISTRY  Recent Labs Lab 05/28/14 1849 05/29/14 0240 05/29/14 1619 05/30/14 0300 05/31/14 0446 06/01/14 0200  NA  --  141 140 139 143 146  K  --  3.7 4.2 3.9 3.7 3.8  CL  --  97 100 100 101 102  CO2  --  29 28 28 26 30   GLUCOSE  --  111* 108*  105* 125* 91  BUN  --  47* 49* 47* 38* 29*  CREATININE 1.93* 2.09* 2.37* 2.04* 1.48* 1.34  CALCIUM  --  8.7 8.5 8.7 9.3 9.2  MG 2.3 1.9  --  2.1 1.9 2.0  PHOS  --  3.2  --  2.3 1.8* 3.0   Estimated Creatinine Clearance: 57.1 mL/min (by C-G formula based on Cr of 1.34).   LIVER  Recent Labs Lab 05/29/14 0853  INR 1.07     INFECTIOUS  Recent Labs Lab 05/28/14 2124 05/29/14 0240 05/29/14 0242 05/29/14 1500 05/30/14 0300 05/31/14  LATICACIDVEN  --   --  2.3* 1.6  --  1.4  PROCALCITON 0.16 0.29  --   --  0.37  --      ENDOCRINE CBG (last 3)  No results for input(s): GLUCAP in the last 72 hours.  IMAGING x48h Dg Chest Port 1 View  06/01/2014   CLINICAL DATA:  Respiratory failure.  EXAM: PORTABLE CHEST - 1 VIEW  COMPARISON:  05/31/2014  FINDINGS: Left IJ central line tip overlies the level of the  superior vena cava. Nasogastric tube and endotracheal tube have been removed. Aeration of the left lung base has improved. There is persistent perihilar atelectasis. Mild interstitial edema is suspected. Low lung volumes.  IMPRESSION: Improved aeration.  Status post removal of endotracheal tube and nasogastric tube.   Electronically Signed   By: Rosalie GumsBeth  Brown M.D.   On: 06/01/2014 07:11   Dg Chest Port 1 View  05/31/2014   CLINICAL DATA:  Acute respiratory failure  EXAM: PORTABLE CHEST - 1 VIEW  COMPARISON:  05/30/2014  FINDINGS: Endotracheal and nasogastric tubes are appropriately positioned. Left IJ central venous catheter is noted with tip over the brachiocephalic/SVC confluence. Trace pleural effusions are noted bilaterally. Lungs are hypoaerated with increased areas of linear bibasilar presumed atelectasis.  IMPRESSION: Hypoaeration with increased bibasilar atelectasis.   Electronically Signed   By: Christiana PellantGretchen  Green M.D.   On: 05/31/2014 08:02     ASSESSMENT / PLAN:  PULMONARY OETT 11/4>>11/8 A: Acute Respiratory failure with hypoxemia > at this point suspect atelectasis and pulm edema Acute on Chronic hypercarbia  OSA  Wheezing> improved, due to viral bronchitis pneumonitis? He never smoked  P:  6L [place] O2, wean for SpO2 > 92% Extubated BiPAP QHS Trend ABG prn Diuresis as tolerated Obtain CT chest if oxygenation doesn't improve with diuresis  CARDIOVASCULAR A: Hx of ASCAD w/ prior PCI of left circ,  Hx of HTN, on home lopressors Hx of  acute staph pericarditis w/ pericardial window in 2007, MI, and PVD s/p L CEA  Hx of Moderate AS  P:  Restarted Plavix, ASA  RESTART Metoprolol per tube at5 half home dose CVC for CVP monitoring Cards folowing  RENAL Lab Results  Component Value Date   CREATININE 1.34 06/01/2014   CREATININE 1.48* 05/31/2014   CREATININE 2.04* 05/30/2014    A: CKD stage IV (Cr 1.6 baseline)  BPH on home -  Flomax, lasix, myrbetriq AKI -  resolved   P:  Monitor Restart lasix  at IV  GASTROINTESTINAL A: Abdominal distention Reported constipation  GERD  P:   protonix  NS KVO  tube feeds 05/30/14  Advance diet  HEMATOLOGIC A: VTE Ppx  Thrombocytopenia  Hx of Gout  P:  SCD's  Holding home allopurinol   INFECTIOUS PCT >> 0.29 BCx2 11/05>>NGRD  UC 11/05>>neg Sputum 11/05 not done 11/6 sterp/legion>>neg  A: SIRS on admission (hypothermic and tachypnea)  Hx  of repeated UTI's   P:  11/7 Levaquin day 3/x 11/6 Resp virus panel>>  urine leg and strep both negative  ENDOCRINE TSH >2.70 A: No acute issues P:   NEUROLOGIC A:  Chronic spinal stenosis w/ chronic back pain: on lyrica and percocet Hx of TIA  Hx of Restless leg syndrome : on ativan, requip, remeron  P:  Start Ativan prn Restart home requip, remerron, lyrica (dose of lyrica renal adjusted) Fent prn- back to gtt if needed  FAMILY  - Updates: No family at bedside   - Inter-disciplinary family meet or Palliative Care meeting due by:  11/12    TODAY'S SUMMARY:  Will advance diet. Transfer to SDU. Continue to wean O2 as tolerated.   Myra Rude, MD PGY-2, [facility] 06/01/2014, 9:21 AM   Attending:  I have seen and examined the patient with nurse practitioner/resident and agree with the note above.   Extubated yesterday, doing well this morning Lungs clear on my exam, aortic and mitral murmur noted Some edema Awake, alert, to work with PT today, eat, go to step down Appreciate cardiology  Transfer to SDU, [facility] service, PCCM off.  Heber [place], MD [place] [facility] Pager: [phone] Cell: [phone] If no response, call [phone]

## 2014-06-01 NOTE — Progress Notes (Signed)
RT Note: Pt not wanting to wear CPAP any longer tonight. Placed back on 6L [place].

## 2014-06-02 ENCOUNTER — Inpatient Hospital Stay (HOSPITAL_COMMUNITY): Payer: Medicare Other

## 2014-06-02 DIAGNOSIS — B348 Other viral infections of unspecified site: Secondary | ICD-10-CM

## 2014-06-02 DIAGNOSIS — R5381 Other malaise: Secondary | ICD-10-CM

## 2014-06-02 LAB — CBC
HCT: 43.2 % (ref 39.0–52.0)
Hemoglobin: 13.7 g/dL (ref 13.0–17.0)
MCH: 29.4 pg (ref 26.0–34.0)
MCHC: 31.7 g/dL (ref 30.0–36.0)
MCV: 92.7 fL (ref 78.0–100.0)
PLATELETS: 99 10*3/uL — AB (ref 150–400)
RBC: 4.66 MIL/uL (ref 4.22–5.81)
RDW: 15.6 % — ABNORMAL HIGH (ref 11.5–15.5)
WBC: 6.3 10*3/uL (ref 4.0–10.5)

## 2014-06-02 LAB — LEGIONELLA ANTIGEN, URINE

## 2014-06-02 MED ORDER — MIRABEGRON ER 50 MG PO TB24
50.0000 mg | ORAL_TABLET | Freq: Every day | ORAL | Status: DC
  Administered 2014-06-02 – 2014-06-05 (×4): 50 mg via ORAL
  Filled 2014-06-02 (×5): qty 1

## 2014-06-02 MED ORDER — GUAIFENESIN ER 600 MG PO TB12
600.0000 mg | ORAL_TABLET | Freq: Two times a day (BID) | ORAL | Status: DC
  Administered 2014-06-02 – 2014-06-05 (×7): 600 mg via ORAL
  Filled 2014-06-02 (×8): qty 1

## 2014-06-02 MED ORDER — HYDROCOD POLST-CHLORPHEN POLST 10-8 MG/5ML PO LQCR
5.0000 mL | Freq: Once | ORAL | Status: AC
  Administered 2014-06-02: 5 mL via ORAL
  Filled 2014-06-02: qty 5

## 2014-06-02 MED ORDER — ALBUTEROL SULFATE (2.5 MG/3ML) 0.083% IN NEBU
2.5000 mg | INHALATION_SOLUTION | RESPIRATORY_TRACT | Status: DC | PRN
  Administered 2014-06-02 – 2014-06-03 (×3): 2.5 mg via RESPIRATORY_TRACT
  Filled 2014-06-02 (×3): qty 3

## 2014-06-02 MED ORDER — LEVOFLOXACIN IN D5W 750 MG/150ML IV SOLN
750.0000 mg | INTRAVENOUS | Status: DC
  Administered 2014-06-02 – 2014-06-03 (×2): 750 mg via INTRAVENOUS
  Filled 2014-06-02 (×2): qty 150

## 2014-06-02 MED ORDER — POLYETHYLENE GLYCOL 3350 17 G PO PACK
17.0000 g | PACK | Freq: Every day | ORAL | Status: DC
  Administered 2014-06-02 – 2014-06-05 (×4): 17 g via ORAL
  Filled 2014-06-02 (×4): qty 1

## 2014-06-02 MED ORDER — TECHNETIUM TC 99M DIETHYLENETRIAME-PENTAACETIC ACID: 30.0000 | Freq: Once | INTRAVENOUS | Status: AC | PRN

## 2014-06-02 MED ORDER — TECHNETIUM TO 99M ALBUMIN AGGREGATED
3.0000 | Freq: Once | INTRAVENOUS | Status: AC | PRN
  Administered 2014-06-02: 3 via INTRAVENOUS

## 2014-06-02 MED ORDER — METOPROLOL TARTRATE 25 MG PO TABS
25.0000 mg | ORAL_TABLET | Freq: Two times a day (BID) | ORAL | Status: DC
  Administered 2014-06-02 – 2014-06-05 (×7): 25 mg via ORAL
  Filled 2014-06-02 (×9): qty 1

## 2014-06-02 NOTE — Progress Notes (Signed)
Rehab admissions - I met with pt in follow up to rehab MD consult and explained the possibility of inpatient rehab. Questions were answered and pt specifically asked about the possibility of going to [facility]. "My daughter-in-law works there". I explained the differences in level of rehab intensity of rehab vs SNF and pt thought the pace of SNF rehab may be more appropriate. "I can't walk because of my arthritis." I left informational brochures about our rehab program with him and will check with pt tomorrow about his thoughts on rehab. Pt then left for VQ scan with transport.  I will follow up with pt tomorrow. I updated Marjorie Smolder, Education officer, museum. Please call me with any questions.  Thanks.  Nanetta Batty, PT Rehabilitation Admissions Coordinator [phone]

## 2014-06-02 NOTE — Progress Notes (Signed)
PROGRESS NOTE  [name] FMB:[id number] DOB: 03/18/1933 DOA: 05/28/2014 PCP: Gwendolyn Grant, MD  80 yo M brought to [facility] ED by EMS on 11/4 after have a change in mental status. Initially heard to be wheezing and had an episode of hypoxemia (82% on room air). Venturi mask was placed with no improvement. He was placed on 15 mL of NRB and sats improved to 96%. Found to be hypercarbic (7.17/99.2/99.0) and hypoxic on NRB, so placed on BiPAP. Failed to improved on BiPAP and was intubated in the ED.    Assessment/Plan: Acute respiratory failure- wean O2 as tolerated; wears Cpap at night but not O2 -still wheezing- add breathing treatments -extubated -abx  +rhino virus- ?if we can d/c droplet precautions  Fluid overload with normal EF -IV lasix -has diuresed about 7L since admission  HTN - ASA/plavix Metoprolol  Hx of moderate AS  CKD- stage IV- baseline 1.6  BPH -flomax  Elevated d dimer- 11/6 -V/Q scan?- unable to have CTA as CKD  Chronic spinal stenosis w/ chronic back pain: on lyrica and percocet  Hx of Restless leg syndrome: on ativan, requip, remeron  Constipation- added miralax    Code Status: full Family Communication: patient Disposition Plan:    Consultants:  tx out ICU- PCCM  Procedures:  intubation    HPI/Subjective: Breathing still not back to baseline No fever, no chills  Objective: Filed Vitals:   06/02/14 0718  BP: 140/54  Pulse: 86  Temp: 97.1 F (36.2 C)  Resp: 27    Intake/Output Summary (Last 24 hours) at 06/02/14 0825 Last data filed at 06/02/14 0317  Gross per 24 hour  Intake    610 ml  Output   2400 ml  Net  -1790 ml   Filed Weights   05/31/14 0407 06/01/14 0800 06/02/14 0308  Weight: 112.8 kg (248 lb 10.9 oz) 112.8 kg (248 lb 10.9 oz) 106.4 kg (234 lb 9.1 oz)    Exam:   General:  Chronically ill appearing- on O2  Cardiovascular: rrr  Respiratory: b/l wheezing, tight  Abdomen: +BS,  soft  Musculoskeletal: +edema   Data Reviewed: Basic Metabolic Panel:  Recent Labs Lab 05/28/14 1849 05/29/14 0240 05/29/14 1619 05/30/14 0300 05/31/14 0446 06/01/14 0200  NA  --  141 140 139 143 146  K  --  3.7 4.2 3.9 3.7 3.8  CL  --  97 100 100 101 102  CO2  --  '29 28 28 26 30  ' GLUCOSE  --  111* 108* 105* 125* 91  BUN  --  47* 49* 47* 38* 29*  CREATININE 1.93* 2.09* 2.37* 2.04* 1.48* 1.34  CALCIUM  --  8.7 8.5 8.7 9.3 9.2  MG 2.3 1.9  --  2.1 1.9 2.0  PHOS  --  3.2  --  2.3 1.8* 3.0   Liver Function Tests: No results for input(s): AST, ALT, ALKPHOS, BILITOT, PROT, ALBUMIN in the last 168 hours.  Recent Labs Lab 05/29/14 0739  LIPASE 53  AMYLASE 292*   No results for input(s): AMMONIA in the last 168 hours. CBC:  Recent Labs Lab 05/28/14 1508  05/30/14 0300 05/30/14 1250 05/31/14 0446 06/01/14 0200 06/02/14 0305  WBC 5.6  < > 7.2 7.1 6.4 7.0 6.3  NEUTROABS 2.7  --   --   --   --   --   --   HGB 14.5  < > 13.1 13.4 13.4 13.9 13.7  HCT 46.4  < > 41.2 42.6 42.1 43.8 43.2  MCV 93.5  < > 91.6 92.4 91.3 93.6 92.7  PLT 103*  < > 110* 101* 88* 102* 99*  < > = values in this interval not displayed. Cardiac Enzymes:  Recent Labs Lab 05/29/14 0739 05/29/14 1316 05/29/14 1523 05/29/14 1954 05/30/14 0300  CKTOTAL 272*  --   --   --   --   TROPONINI 0.55* 0.37* 0.35* <0.30 <0.30   BNP (last 3 results)  Recent Labs  02/07/14 2148 03/20/14 0800 05/28/14 1508  PROBNP 325.5 762.3* 477.3*   CBG:  Recent Labs Lab 05/28/14 2039  GLUCAP 247*    Recent Results (from the past 240 hour(s))  Blood Culture (routine x 2)     Status: None (Preliminary result)   Collection Time: 05/28/14  3:16 PM  Result Value Ref Range Status   Specimen Description BLOOD LEFT ARM  Final   Special Requests BOTTLES DRAWN AEROBIC AND ANAEROBIC 5CC  Final   Culture  Setup Time   Final    05/28/2014 21:15 Performed at [facility]    Culture   Final           BLOOD  CULTURE RECEIVED NO GROWTH TO DATE CULTURE WILL BE HELD FOR 5 DAYS BEFORE ISSUING A FINAL NEGATIVE REPORT Performed at [facility]    Report Status PENDING  Incomplete  Blood Culture (routine x 2)     Status: None (Preliminary result)   Collection Time: 05/28/14  3:21 PM  Result Value Ref Range Status   Specimen Description BLOOD RIGHT ARM  Final   Special Requests BOTTLES DRAWN AEROBIC AND ANAEROBIC 5CC  Final   Culture  Setup Time   Final    05/28/2014 21:15 Performed at [facility]    Culture   Final           BLOOD CULTURE RECEIVED NO GROWTH TO DATE CULTURE WILL BE HELD FOR 5 DAYS BEFORE ISSUING A FINAL NEGATIVE REPORT Performed at [facility]    Report Status PENDING  Incomplete  Urine culture     Status: None   Collection Time: 05/28/14  4:10 PM  Result Value Ref Range Status   Specimen Description URINE, CATHETERIZED  Final   Special Requests NONE  Final   Culture  Setup Time   Final    05/28/2014 21:52 Performed at [facility] Performed at [facility]   Final   Culture NO GROWTH Performed at [facility]   Final   Report Status 05/29/2014 FINAL  Final  MRSA PCR Screening     Status: None   Collection Time: 05/28/14  8:31 PM  Result Value Ref Range Status   MRSA by PCR NEGATIVE NEGATIVE Final    Comment:        The GeneXpert MRSA Assay (FDA approved for NASAL specimens only), is one component of a comprehensive MRSA colonization surveillance program. It is not intended to diagnose MRSA infection nor to guide or monitor treatment for MRSA infections.   Respiratory virus panel (routine influenza)     Status: Abnormal   Collection Time: 05/29/14 10:17 AM  Result Value Ref Range Status   Source - RVPAN NASAL SWAB  Corrected    Comment: CORRECTED ON 11/09 AT 2115: PREVIOUSLY REPORTED AS NASAL SWAB   Respiratory Syncytial Virus A NOT DETECTED  Final   Respiratory Syncytial Virus B  NOT DETECTED  Final   Influenza A NOT DETECTED  Final  Influenza B NOT DETECTED  Final   Parainfluenza 1 NOT DETECTED  Final   Parainfluenza 2 NOT DETECTED  Final   Parainfluenza 3 NOT DETECTED  Final   Metapneumovirus NOT DETECTED  Final   Rhinovirus DETECTED (A)  Final   Adenovirus NOT DETECTED  Final   Influenza A H1 NOT DETECTED  Final   Influenza A H3 NOT DETECTED  Final    Comment: (NOTE)       Normal Reference Range for each Analyte: NOT DETECTED Testing performed using the Luminex xTAG Respiratory Viral Panel test kit. The analytical performance characteristics of this assay have been determined by [facility].  The modifications have not been cleared or approved by the FDA. This assay has been validated pursuant to the CLIA regulations and is used for clinical purposes. Performed at [facility]      Studies: Dg Chest Port 1 View  06/01/2014   CLINICAL DATA:  Respiratory failure.  EXAM: PORTABLE CHEST - 1 VIEW  COMPARISON:  05/31/2014  FINDINGS: Left IJ central line tip overlies the level of the superior vena cava. Nasogastric tube and endotracheal tube have been removed. Aeration of the left lung base has improved. There is persistent perihilar atelectasis. Mild interstitial edema is suspected. Low lung volumes.  IMPRESSION: Improved aeration.  Status post removal of endotracheal tube and nasogastric tube.   Electronically Signed   By: Shon Hale M.D.   On: 06/01/2014 07:11    Scheduled Meds: . antiseptic oral rinse  7 mL Mouth Rinse q12n4p  . aspirin EC  81 mg Oral Daily  . buPROPion  150 mg Oral q morning - 10a  . chlorhexidine  15 mL Mouth Rinse BID  . clopidogrel  75 mg Oral Daily  . furosemide  40 mg Intravenous BID  . Influenza vac split quadrivalent PF  0.5 mL Intramuscular Tomorrow-1000  . levofloxacin (LEVAQUIN) IV  750 mg Intravenous Q48H  . pantoprazole  40 mg Oral QHS  . pregabalin  200 mg Oral Daily  . rOPINIRole  4 mg Oral QHS  .  tamsulosin  0.4 mg Oral Daily   Continuous Infusions:  Antibiotics Given (last 72 hours)    Date/Time Action Medication Dose Rate   05/30/14 1220 Given  [med unavailable, notified pharmacy by phone.]   levofloxacin (LEVAQUIN) IVPB 750 mg 750 mg 100 mL/hr   06/01/14 1107 Given   levofloxacin (LEVAQUIN) IVPB 750 mg 750 mg 100 mL/hr      Principal Problem:   Acute respiratory failure with hypoxia and hypercarbia Active Problems:   Type 2 diabetes mellitus with vascular disease   Obesity (BMI 30-39.9)   Encephalopathy acute   AKI (acute kidney injury)   Acute respiratory failure with hypercapnia    Time spent: 35 min    Ettie Krontz  [facility] Pager [phone]. If 7PM-7AM, please contact night-coverage at [email or web address], password [facility] 06/02/2014, 8:25 AM  LOS: 5 days

## 2014-06-02 NOTE — Progress Notes (Signed)
PT Cancellation Note  Patient Details Name: [name] MRN: [id number] DOB: Jun 26, 1933   Cancelled Treatment:    Reason Eval/Treat Not Completed: Patient not medically ready. Noted plans for V/Q scan due to hypoxia and incr d-dimer. Pt no longer on anti-coagulation, therefore will await results of test prior to initiating PT.   Brysyn Brandenberger 06/02/2014, 9:37 AM  Pager [phone]

## 2014-06-02 NOTE — Consult Note (Signed)
Physical Medicine and Rehabilitation Consult  Reason for Consult: Deconditioning Referring Physician: Dr Benjamine Mola   HPI: [name] is a 80 y.o. male with history of CAD, CKD,gout, R-CVA with residual L-HP, OSA with DOE; who was admitted on 05/28/14 with confusion and hypoxia due requiring intubation in ED. He was noted to be hypotensive and bradycardic due to SIRS. He was found to have acute renal failure and was started on dopamine as well as heparin for CP/ACS due to demand ischemia. CXR with cardiac enlargement and edema therefore 2D echo done revealing EF 50-55% with moderate LVH and moderate aortic stenosis. NGT placed due to gaseous distension of abdomen. BLE negative for DVT. He was started on Levaquin on 11/07 for CAP. Cardiology following for input on recurrent SVT. Has been slow to wean off vent due to agitation but tolerated extubation on 11/08. PT/OT evaluations pending.    Review of Systems  Respiratory: Positive for cough, sputum production, shortness of breath and wheezing.   Neurological: Positive for focal weakness.     Past Medical History  Diagnosis Date  . Gout, unspecified     severe dz, "treatment done that last 15 years"  . OBESITY   . RESTLESS LEG SYNDROME   . DETACHMENT, RETINAL NOS 1994  . CATARACT NOS 2008  . HYPERTENSION   . CORONARY ARTERY DISEASE   . PERICARDITIS, ACUTE NEC 2007    MSSA, s/p pericardial window  . GERD   . DEGENERATIVE JOINT DISEASE   . CAROTID ENDARTERECTOMY, LEFT, HX OF   . OBSTRUCTIVE SLEEP APNEA     CPAP qhs  . TIA (transient ischemic attack) 2005  . BPH (benign prostatic hypertrophy)     "minor"  . Lumbar spinal stenosis   . Carotid artery occlusion   . Unspecified venous (peripheral) insufficiency   . Stroke May 2003  . MYOCARDIAL INFARCTION 2004, 03/2010    "minor"  . Anxiety   . Depression   . Shortness of breath     Past Surgical History  Procedure Laterality Date  . Pericardial window  2007  . Left arm   2008    shoulder  . Right arm  1970's    shoulder  . Cataract extraction      Left side x's 2 and right  . Retinal detachment surgery      left side  . Carotid endarterectomy Left 12-06-05    cea  . Coronary angioplasty  01/2009    x 1 stent  . Colonoscopy    . Carpal tunnel release Bilateral   . Back surgery  2013    removed bone spurs  . Inguinal hernia repair Left 10/01/2013    Procedure: HERNIA REPAIR INGUINAL ADULT;  Surgeon: Axel Filler, MD;  Location: [facility];  Service: General;  Laterality: Left;  . Insertion of mesh Left 10/01/2013    Procedure: INSERTION OF MESH;  Surgeon: Axel Filler, MD;  Location: [facility];  Service: General;  Laterality: Left;  . Hernia repair    . Finger amputation  2015    JUST TO FIRST JOINT RIGHT HAND  LAST FINGER    Family History  Problem Relation Age of Onset  . Heart disease Mother   . Diabetes Father   . Heart disease Father   . Breast cancer Sister   . Cancer Sister   . Prostate cancer Brother   . Lung cancer Brother   . Cancer Brother   . Diabetes Daughter   .  Diabetes Son     Social History:  reports that he has never smoked. He has never used smokeless tobacco. He reports that he does not drink alcohol or use illicit drugs.    Allergies  Allergen Reactions  . Atorvastatin Other (See Comments)    REACTION: tol simvastatin ok per pt ( Pt. Says ALL the Statins ) Severe muscle weakness    Medications Prior to Admission  Medication Sig Dispense Refill  . allopurinol (ZYLOPRIM) 300 MG tablet Take 300 mg by mouth every morning.    Marland Kitchen. aspirin EC 81 MG tablet Take 81 mg by mouth daily.    Marland Kitchen. buPROPion (WELLBUTRIN XL) 150 MG 24 hr tablet Take 1 tablet (150 mg total) by mouth every morning. 90 tablet 3  . cholecalciferol (VITAMIN D) 1000 UNITS tablet Take 1,000 Units by mouth daily.    . clopidogrel (PLAVIX) 75 MG tablet Take 1 tablet (75 mg total) by mouth daily. 90 tablet 3  . dextromethorphan (DELSYM) 30 MG/5ML liquid Take 15  mLs by mouth as needed for cough.    . furosemide (LASIX) 40 MG tablet Take 2 tablets (80 mg total) by mouth 2 (two) times daily. 360 tablet 3  . ibuprofen (ADVIL,MOTRIN) 200 MG tablet Take 400 mg by mouth every 6 (six) hours as needed for moderate pain.    Marland Kitchen. LORazepam (ATIVAN) 1 MG tablet Take 1 tablet (1 mg total) by mouth every morning. 90 tablet 1  . LYRICA 200 MG capsule Take 200 mg by mouth 2 (two) times daily.    . Magnesium 250 MG TABS Take 250 mg by mouth daily.     . metoprolol (LOPRESSOR) 50 MG tablet Take 1 tablet (50 mg total) by mouth 2 (two) times daily. 180 tablet 3  . mirabegron ER (MYRBETRIQ) 50 MG TB24 tablet Take 50 mg by mouth daily.    . mirtazapine (REMERON) 15 MG tablet Take 1 tablet (15 mg total) by mouth at bedtime. 90 tablet 3  . Omega-3 Fatty Acids (FISH OIL) 1000 MG CAPS Take 1,000 mg by mouth daily.    Marland Kitchen. oxyCODONE-acetaminophen (PERCOCET) 10-325 MG per tablet Take 1 tablet by mouth every 8 (eight) hours as needed for pain. 60 tablet 0  . pantoprazole (PROTONIX) 40 MG tablet Take 1 tablet (40 mg total) by mouth 2 (two) times daily. 30 tablet 0  . phenol (CHLORASEPTIC) 1.4 % LIQD Use as directed 1 spray in the mouth or throat as needed for throat irritation / pain.    . potassium chloride SA (K-DUR,KLOR-CON) 20 MEQ tablet Take 1 tablet (20 mEq total) by mouth 2 (two) times daily. 180 tablet 3  . rOPINIRole (REQUIP) 4 MG tablet Take 4 mg by mouth at bedtime.    . tamsulosin (FLOMAX) 0.4 MG CAPS capsule Take 1 capsule (0.4 mg total) by mouth daily. 90 capsule 3  . cefUROXime (CEFTIN) 500 MG tablet Take 1 tablet (500 mg total) by mouth 2 (two) times daily with a meal. 14 tablet 0  . Ferrous Gluconate 256 (28 FE) MG TABS Take 256 mg by mouth daily.    . nitroGLYCERIN (NITROSTAT) 0.4 MG SL tablet Place 0.4 mg under the tongue every 5 (five) minutes as needed. For chest pain      Home: Home Living Family/patient expects to be discharged to:: Private residence Living  Arrangements: Children  Functional History:   Functional Status:  Mobility:          ADL:    Cognition: Cognition  Orientation Level: Oriented X4    Blood pressure 140/54, pulse 86, temperature 97.1 F (36.2 C), temperature source Oral, resp. rate 27, height 5\' 11"  (1.803 m), weight 106.4 kg (234 lb 9.1 oz), SpO2 95 %. Physical Exam  Musculoskeletal:  Chronic changes in fingers/ reduced ROM.  Neurological:  Decreased LT in stocking glove distribution in both feet/fingers. Strength 3+ deltoid, 4/5 bicep,tricep, 3- grip. LE: 3+ HF, KE and 4/5 at ankle/foog    Results for orders placed or performed during the hospital encounter of 05/28/14 (from the past 24 hour(s))  CBC     Status: Abnormal   Collection Time: 06/02/14  3:05 AM  Result Value Ref Range   WBC 6.3 4.0 - 10.5 K/uL   RBC 4.66 4.22 - 5.81 MIL/uL   Hemoglobin 13.7 13.0 - 17.0 g/dL   HCT 16.1 09.6 - 04.5 %   MCV 92.7 78.0 - 100.0 fL   MCH 29.4 26.0 - 34.0 pg   MCHC 31.7 30.0 - 36.0 g/dL   RDW 40.9 (H) 81.1 - 91.4 %   Platelets 99 (L) 150 - 400 K/uL   Dg Chest Port 1 View  06/01/2014   CLINICAL DATA:  Respiratory failure.  EXAM: PORTABLE CHEST - 1 VIEW  COMPARISON:  05/31/2014  FINDINGS: Left IJ central line tip overlies the level of the superior vena cava. Nasogastric tube and endotracheal tube have been removed. Aeration of the left lung base has improved. There is persistent perihilar atelectasis. Mild interstitial edema is suspected. Low lung volumes.  IMPRESSION: Improved aeration.  Status post removal of endotracheal tube and nasogastric tube.   Electronically Signed   By: Rosalie Gums M.D.   On: 06/01/2014 07:11    Assessment/Plan: Diagnosis: deconditioning related SIRS and multiple medical 1. Does the need for close, 24 hr/day medical supervision in concert with the patient's rehab needs make it unreasonable for this patient to be served in a less intensive setting? Yes 2. Co-Morbidities requiring  supervision/potential complications: gout, obesity, CAD, AKI 3. Due to bladder management, bowel management, safety, skin/wound care, disease management, medication administration, pain management and patient education, does the patient require 24 hr/day rehab nursing? Yes 4. Does the patient require coordinated care of a physician, rehab nurse, PT (1-2 hrs/day, 5 days/week) and OT (1-2 hrs/day, 5 days/week) to address physical and functional deficits in the context of the above medical diagnosis(es)? Yes Addressing deficits in the following areas: balance, endurance, locomotion, strength, transferring, bowel/bladder control, bathing, dressing, feeding, grooming, toileting and psychosocial support 5. Can the patient actively participate in an intensive therapy program of at least 3 hrs of therapy per day at least 5 days per week? Yes and Potentially 6. The potential for patient to make measurable gains while on inpatient rehab is good 7. Anticipated functional outcomes upon discharge from inpatient rehab are supervision  with PT, supervision with OT, n/a with SLP. 8. Estimated rehab length of stay to reach the above functional goals is: likely 7-14 days 9. Does the patient have adequate social supports to accommodate these discharge functional goals? Yes 10. Anticipated D/C setting: Home 11. Anticipated post D/C treatments: HH therapy 12. Overall Rehab/Functional Prognosis: good  RECOMMENDATIONS: This patient's condition is appropriate for continued rehabilitative care in the following setting: CIR Patient has agreed to participate in recommended program. Yes Note that insurance prior authorization may be required for reimbursement for recommended care.  Comment: Await mobilization with therapy. Daughter will stay with patient at discharge. Rehab Admissions Coordinator to follow  up.  Thanks,  Ranelle Oyster, MD, [place] [facility]     06/02/2014

## 2014-06-02 NOTE — Progress Notes (Signed)
Subjective:  Says better.  He is still hoarse, but no c/o SOB or chest pain. Objective:  Vital Signs in the last 24 hours: BP 140/54 mmHg  Pulse 101  Temp(Src) 97.1 F (36.2 C) (Oral)  Resp 17  Ht 5\' 11"  (1.803 m)  Wt 106.4 kg (234 lb 9.1 oz)  BMI 32.73 kg/m2  SpO2 94%  Physical Exam: Obese plethoric WM calm and in NAD Lungs:  Relatively clear now Cardiac: Regular rhythm, normal S1 and S2, no S3 2-3/6 harsh systolic murmur. Extremities:  1+edema present  Intake/Output from previous day: 11/09 0701 - 11/10 0700 In: 620 [P.O.:360; I.V.:110; IV Piggyback:150] Out: 2575 [Urine:2575]  Weight Filed Weights   05/31/14 0407 06/01/14 0800 06/02/14 0308  Weight: 112.8 kg (248 lb 10.9 oz) 112.8 kg (248 lb 10.9 oz) 106.4 kg (234 lb 9.1 oz)    Lab Results: Basic Metabolic Panel:  Recent Labs  81/27/51 0446 06/01/14 0200  NA 143 146  K 3.7 3.8  CL 101 102  CO2 26 30  GLUCOSE 125* 91  BUN 38* 29*  CREATININE 1.48* 1.34   CBC:  Recent Labs  06/01/14 0200 06/02/14 0305  WBC 7.0 6.3  HGB 13.9 13.7  HCT 43.8 43.2  MCV 93.6 92.7  PLT 102* 99*    Telemetry: Sinus rhythm  Assessment/Plan:  1. Acute respiratory failure undetermined type possible viral pneumonia-appears to have resolved but still very weak 2. Hypotension resolved 3. Moderate aortic stenosis 4. Acute renal failure-continues to improve 5. CAD with prior stent 6. Low level elevation of troponin c/w demand ischemia 7. Thrombocytopenia persists 8. Bradycardia on admission likely due to acute respiratory failure  Rec:  Still diuresing some.urine output continues to improve. No evidence of recurrent bradycardia.Continue to diurese and watch renal function.  Darden Palmer  MD [facility]  06/02/2014, 12:48 PM

## 2014-06-02 NOTE — Progress Notes (Signed)
ANTIBIOTIC CONSULT NOTE - FOLLOW UP  Pharmacy Consult for Levaquin Indication: rule out pneumonia  Allergies  Allergen Reactions  . Atorvastatin Other (See Comments)    REACTION: tol simvastatin ok per pt ( Pt. Says ALL the Statins ) Severe muscle weakness    Patient Measurements: Height: '5\' 11"'  (180.3 cm) Weight: 234 lb 9.1 oz (106.4 kg) IBW/kg (Calculated) : 75.3  Vital Signs: Temp: 97.1 F (36.2 C) (11/10 0718) Temp Source: Oral (11/10 0718) BP: 140/54 mmHg (11/10 0718) Pulse Rate: 86 (11/10 0718) Intake/Output from previous day: 11/09 0701 - 11/10 0700 In: 620 [P.O.:360; I.V.:110; IV Piggyback:150] Out: 2575 [Urine:2575] Intake/Output from this shift:    Labs:  Recent Labs  05/31/14 0446 06/01/14 0200 06/02/14 0305  WBC 6.4 7.0 6.3  HGB 13.4 13.9 13.7  PLT 88* 102* 99*  CREATININE 1.48* 1.34  --    Estimated Creatinine Clearance: 55.4 mL/min (by C-G formula based on Cr of 1.34). No results for input(s): VANCOTROUGH, VANCOPEAK, VANCORANDOM, GENTTROUGH, GENTPEAK, GENTRANDOM, TOBRATROUGH, TOBRAPEAK, TOBRARND, AMIKACINPEAK, AMIKACINTROU, AMIKACIN in the last 72 hours.   Microbiology: Recent Results (from the past 720 hour(s))  Blood Culture (routine x 2)     Status: None (Preliminary result)   Collection Time: 05/28/14  3:16 PM  Result Value Ref Range Status   Specimen Description BLOOD LEFT ARM  Final   Special Requests BOTTLES DRAWN AEROBIC AND ANAEROBIC 5CC  Final   Culture  Setup Time   Final    05/28/2014 21:15 Performed at [facility]    Culture   Final           BLOOD CULTURE RECEIVED NO GROWTH TO DATE CULTURE WILL BE HELD FOR 5 DAYS BEFORE ISSUING A FINAL NEGATIVE REPORT Performed at [facility]    Report Status PENDING  Incomplete  Blood Culture (routine x 2)     Status: None (Preliminary result)   Collection Time: 05/28/14  3:21 PM  Result Value Ref Range Status   Specimen Description BLOOD RIGHT ARM  Final   Special Requests  BOTTLES DRAWN AEROBIC AND ANAEROBIC 5CC  Final   Culture  Setup Time   Final    05/28/2014 21:15 Performed at [facility]    Culture   Final           BLOOD CULTURE RECEIVED NO GROWTH TO DATE CULTURE WILL BE HELD FOR 5 DAYS BEFORE ISSUING A FINAL NEGATIVE REPORT Performed at [facility]    Report Status PENDING  Incomplete  Urine culture     Status: None   Collection Time: 05/28/14  4:10 PM  Result Value Ref Range Status   Specimen Description URINE, CATHETERIZED  Final   Special Requests NONE  Final   Culture  Setup Time   Final    05/28/2014 21:52 Performed at [place] Performed at [facility]   Final   Culture NO GROWTH Performed at [facility]   Final   Report Status 05/29/2014 FINAL  Final  MRSA PCR Screening     Status: None   Collection Time: 05/28/14  8:31 PM  Result Value Ref Range Status   MRSA by PCR NEGATIVE NEGATIVE Final    Comment:        The GeneXpert MRSA Assay (FDA approved for NASAL specimens only), is one component of a comprehensive MRSA colonization surveillance program. It is not intended to diagnose MRSA infection nor to guide or  monitor treatment for MRSA infections.   Respiratory virus panel (routine influenza)     Status: Abnormal   Collection Time: 05/29/14 10:17 AM  Result Value Ref Range Status   Source - RVPAN NASAL SWAB  Corrected    Comment: CORRECTED ON 11/09 AT 2115: PREVIOUSLY REPORTED AS NASAL SWAB   Respiratory Syncytial Virus A NOT DETECTED  Final   Respiratory Syncytial Virus B NOT DETECTED  Final   Influenza A NOT DETECTED  Final   Influenza B NOT DETECTED  Final   Parainfluenza 1 NOT DETECTED  Final   Parainfluenza 2 NOT DETECTED  Final   Parainfluenza 3 NOT DETECTED  Final   Metapneumovirus NOT DETECTED  Final   Rhinovirus DETECTED (A)  Final   Adenovirus NOT DETECTED  Final   Influenza A H1 NOT DETECTED  Final   Influenza A H3 NOT DETECTED   Final    Comment: (NOTE)       Normal Reference Range for each Analyte: NOT DETECTED Testing performed using the Luminex xTAG Respiratory Viral Panel test kit. The analytical performance characteristics of this assay have been determined by [facility].  The modifications have not been cleared or approved by the FDA. This assay has been validated pursuant to the CLIA regulations and is used for clinical purposes. Performed at [facility] [facility]    Start     Dose/Rate Route Frequency Ordered Stop   05/30/14 1100  levofloxacin (LEVAQUIN) IVPB 750 mg     750 mg100 mL/hr over 90 Minutes Intravenous Every 48 hours 05/30/14 0945     05/28/14 1630  cefTRIAXone (ROCEPHIN) 2 g in dextrose 5 % 50 mL IVPB     2 g100 mL/hr over 30 Minutes Intravenous  Once 05/28/14 1620 05/28/14 1757      Assessment: 80 yo M on day #4 Levaquin for CAP.  Pt still hypoxic and increased O2 requirement so will not change to PO at this time.  Renal function has returned to baseline.  With CrCl >50 will adjust dosing to q24h.  Goal of Therapy:  Renal dose adjustment  Plan:  Change Levaquin to 786m IV q24h Change to PO as appropriate. Rx will sign off.  [employer] Pharm.D., BCPS Clinical Pharmacist Pager [phone]/04/2014 10:22 AM

## 2014-06-03 DIAGNOSIS — N184 Chronic kidney disease, stage 4 (severe): Secondary | ICD-10-CM

## 2014-06-03 LAB — CBC
HEMATOCRIT: 43.6 % (ref 39.0–52.0)
Hemoglobin: 13.9 g/dL (ref 13.0–17.0)
MCH: 29.5 pg (ref 26.0–34.0)
MCHC: 31.9 g/dL (ref 30.0–36.0)
MCV: 92.6 fL (ref 78.0–100.0)
Platelets: 107 10*3/uL — ABNORMAL LOW (ref 150–400)
RBC: 4.71 MIL/uL (ref 4.22–5.81)
RDW: 15.2 % (ref 11.5–15.5)
WBC: 7.2 10*3/uL (ref 4.0–10.5)

## 2014-06-03 LAB — BASIC METABOLIC PANEL
ANION GAP: 10 (ref 5–15)
BUN: 32 mg/dL — ABNORMAL HIGH (ref 6–23)
CALCIUM: 9.2 mg/dL (ref 8.4–10.5)
CO2: 33 meq/L — AB (ref 19–32)
CREATININE: 1.49 mg/dL — AB (ref 0.50–1.35)
Chloride: 96 mEq/L (ref 96–112)
GFR calc Af Amer: 50 mL/min — ABNORMAL LOW (ref >90)
GFR calc non Af Amer: 43 mL/min — ABNORMAL LOW (ref >90)
Glucose, Bld: 123 mg/dL — ABNORMAL HIGH (ref 70–99)
Potassium: 4.2 mEq/L (ref 3.7–5.3)
Sodium: 139 mEq/L (ref 137–147)

## 2014-06-03 LAB — CULTURE, BLOOD (ROUTINE X 2)
CULTURE: NO GROWTH
Culture: NO GROWTH

## 2014-06-03 MED ORDER — VITAMIN D3 25 MCG (1000 UNIT) PO TABS
1000.0000 [IU] | ORAL_TABLET | Freq: Every day | ORAL | Status: DC
  Administered 2014-06-03 – 2014-06-05 (×3): 1000 [IU] via ORAL
  Filled 2014-06-03 (×3): qty 1

## 2014-06-03 MED ORDER — SENNOSIDES-DOCUSATE SODIUM 8.6-50 MG PO TABS
1.0000 | ORAL_TABLET | Freq: Two times a day (BID) | ORAL | Status: DC
  Administered 2014-06-03 – 2014-06-05 (×5): 1 via ORAL
  Filled 2014-06-03 (×7): qty 1

## 2014-06-03 MED ORDER — ALLOPURINOL 300 MG PO TABS
300.0000 mg | ORAL_TABLET | Freq: Every morning | ORAL | Status: DC
  Administered 2014-06-03 – 2014-06-05 (×3): 300 mg via ORAL
  Filled 2014-06-03 (×3): qty 1

## 2014-06-03 MED ORDER — BISACODYL 10 MG RE SUPP
10.0000 mg | Freq: Every day | RECTAL | Status: DC | PRN
  Administered 2014-06-03 – 2014-06-04 (×2): 10 mg via RECTAL
  Filled 2014-06-03 (×2): qty 1

## 2014-06-03 MED ORDER — FUROSEMIDE 40 MG PO TABS
40.0000 mg | ORAL_TABLET | Freq: Two times a day (BID) | ORAL | Status: DC
  Administered 2014-06-03 – 2014-06-05 (×4): 40 mg via ORAL
  Filled 2014-06-03 (×9): qty 1

## 2014-06-03 MED ORDER — LEVOFLOXACIN 750 MG PO TABS
750.0000 mg | ORAL_TABLET | Freq: Every day | ORAL | Status: DC
  Administered 2014-06-04 – 2014-06-05 (×2): 750 mg via ORAL
  Filled 2014-06-03 (×2): qty 1

## 2014-06-03 NOTE — Progress Notes (Signed)
Rehab admissions - I am following pt's case and spoke with pt about his possible rehab options. I again explained the differences between acute inpatient rehab and SNF rehab. Pt is interested in pursuing SNF rehab at [facility]. "It is a few miles from my house and my dtr-in-law works there." Pt was more comfortable pursuing rehab at a SNF level versus the intensity of our inpatient rehab program as well.  I then called pt's dtr Debbie to share this update and she was in agreement to pursue SNF for her father. Further questions were answered.  I told both pt and dtr that social worker would follow up to help plan for SNF per their preference. I called Dysheka, Child psychotherapist, to share this update as well.  I will now sign off pt's case. Thanks.  Juliann Mule, PT Rehabilitation Admissions Coordinator [phone]

## 2014-06-03 NOTE — Clinical Social Work Psychosocial (Signed)
Clinical Social Work Department BRIEF PSYCHOSOCIAL ASSESSMENT 06/03/2014  Patient:  [name], [name]     Account Number:  [id number]     Admit date:  05/28/2014  Clinical Social Worker:  Marciano Sequin  Date/Time:  06/03/2014 11:32 AM  Referred by:  RN  Date Referred:  06/03/2014 Referred for  SNF Placement   Other Referral:   Interview type:  Patient Other interview type:    PSYCHOSOCIAL DATA Living Status:  ALONE Admitted from facility:   Level of care:   Primary support name:  Pressley,Debra & Lieb,Richard Primary support relationship to patient:  CHILD, ADULT Degree of support available:   Strong Support System    CURRENT CONCERNS  Other Concerns:    SOCIAL WORK ASSESSMENT / PLAN CSW met the pt at the bedside. CSW introduced self and purpose of the visit. CSW and pt discussed the geographic area in which the pt is interest in selecting for SNF placement. Pt identified [facility] as the SNF he would like to transition to. CSW explained the SNF process to the pt. CSW will continue to follow this pt and assist with discharge as needed.   Assessment/plan status:  Psychosocial Support/Ongoing Assessment of Needs Other assessment/ plan:   Information/referral to community resources:    PATIENT'S/FAMILY'S RESPONSE TO PLAN OF CARE: Pt presented with a calm mood and affect.Pt reported understanding his current condition and then went on to explained why he selected [facility] SNF.   [place], MSW, [place]

## 2014-06-03 NOTE — Progress Notes (Signed)
NUTRITION FOLLOW UP  DOCUMENTATION CODES Per approved criteria  -Obesity Unspecified   INTERVENTION: Encourage adequate PO intake.  If PO intake becomes poor, RD to order/provide oral supplement.  NUTRITION DIAGNOSIS: Inadequate oral intake related to inability to eat as evidenced by NPO; advanced to regular diet; ongoing.  Goal: Pt to meet >/= 90% of their estimated nutrition needs   Monitor:  PO intake, weight trends, labs, I/O's  80 y.o. male  Admitting Dx: Acute respiratory failure with hypoxia and hypercarbia  ASSESSMENT: 80 yo M [facility] on 11/4 after have a change in mental status. Initially heard to be wheezing and had an episode of hypoxemia (82% on room air). Venturi mask was placed with no improvement. He was placed on 15 mL of NRB and sats improved to 96%. Found to be hypercarbic (7.17/99.2/99.0) and hypoxic on NRB, so placed on BiPAP. Failed to improved on BiPAP and was intubated in the ED.   Pt extubated 11/8.  Pt is currently on a regular diet. Pt reports he currently has no appetite due to abdominal discomfort and difficulty with BM. Pt reports once his BM is relieved, his appetite will be fine. Pt was encouraged to eat his food at his meals. Pt declined supplements. RD to order supplements, if pt's appetite becomes poor.   Labs: Low GFR. High BUN and creatinine.  Height: Ht Readings from Last 1 Encounters:  06/03/14 5\' 10"  (1.778 m)    Weight: Wt Readings from Last 1 Encounters:  06/03/14 234 lb 11.2 oz (106.459 kg)    BMI:  Body mass index is 33.68 kg/(m^2). Class I obesity  Re-Estimated Nutritional Needs: Kcal: 2250-2400 Protein: 145-155 g Fluid: Per MD  Skin: wound on lower legs  Diet Order: Diet regular   Intake/Output Summary (Last 24 hours) at 06/03/14 1554 Last data filed at 06/02/14 1933  Gross per 24 hour  Intake      0 ml  Output    200 ml  Net   -200 ml    Last BM: 11/5  Labs:   Recent Labs Lab  05/30/14 0300 05/31/14 0446 06/01/14 0200 06/03/14 0019  NA 139 143 146 139  K 3.9 3.7 3.8 4.2  CL 100 101 102 96  CO2 28 26 30  33*  BUN 47* 38* 29* 32*  CREATININE 2.04* 1.48* 1.34 1.49*  CALCIUM 8.7 9.3 9.2 9.2  MG 2.1 1.9 2.0  --   PHOS 2.3 1.8* 3.0  --   GLUCOSE 105* 125* 91 123*    CBG (last 3)  No results for input(s): GLUCAP in the last 72 hours.  Scheduled Meds: . allopurinol  300 mg Oral q morning - 10a  . antiseptic oral rinse  7 mL Mouth Rinse q12n4p  . aspirin EC  81 mg Oral Daily  . buPROPion  150 mg Oral q morning - 10a  . chlorhexidine  15 mL Mouth Rinse BID  . cholecalciferol  1,000 Units Oral Daily  . clopidogrel  75 mg Oral Daily  . furosemide  40 mg Oral BID  . guaiFENesin  600 mg Oral BID  . Influenza vac split quadrivalent PF  0.5 mL Intramuscular Tomorrow-1000  . [START ON 06/04/2014] levofloxacin  750 mg Oral Daily  . metoprolol tartrate  25 mg Oral BID  . mirabegron ER  50 mg Oral Daily  . pantoprazole  40 mg Oral QHS  . polyethylene glycol  17 g Oral Daily  . pregabalin  200 mg  Oral Daily  . rOPINIRole  4 mg Oral QHS  . senna-docusate  1 tablet Oral BID  . tamsulosin  0.4 mg Oral Daily    Continuous Infusions:    Past Medical History  Diagnosis Date  . Gout, unspecified     severe dz, "treatment done that last 15 years"  . OBESITY   . RESTLESS LEG SYNDROME   . DETACHMENT, RETINAL NOS 1994  . CATARACT NOS 2008  . HYPERTENSION   . CORONARY ARTERY DISEASE   . PERICARDITIS, ACUTE NEC 2007    MSSA, s/p pericardial window  . GERD   . DEGENERATIVE JOINT DISEASE   . CAROTID ENDARTERECTOMY, LEFT, HX OF   . OBSTRUCTIVE SLEEP APNEA     CPAP qhs  . TIA (transient ischemic attack) 2005  . BPH (benign prostatic hypertrophy)     "minor"  . Lumbar spinal stenosis   . Carotid artery occlusion   . Unspecified venous (peripheral) insufficiency   . Stroke May 2003  . MYOCARDIAL INFARCTION 2004, 03/2010    "minor"  . Anxiety   . Depression    . Shortness of breath     Past Surgical History  Procedure Laterality Date  . Pericardial window  2007  . Left arm  2008    shoulder  . Right arm  1970's    shoulder  . Cataract extraction      Left side x's 2 and right  . Retinal detachment surgery      left side  . Carotid endarterectomy Left 12-06-05    cea  . Coronary angioplasty  01/2009    x 1 stent  . Colonoscopy    . Carpal tunnel release Bilateral   . Back surgery  2013    removed bone spurs  . Inguinal hernia repair Left 10/01/2013    Procedure: HERNIA REPAIR INGUINAL ADULT;  Surgeon: Axel Filler, MD;  Location: [facility];  Service: General;  Laterality: Left;  . Insertion of mesh Left 10/01/2013    Procedure: INSERTION OF MESH;  Surgeon: Axel Filler, MD;  Location: [facility];  Service: General;  Laterality: Left;  . Hernia repair    . Finger amputation  2015    JUST TO FIRST JOINT RIGHT HAND  LAST FINGER    Marijean Niemann, MS, RD, LDN Pager # [phone] After hours/ weekend pager # [phone]

## 2014-06-03 NOTE — Progress Notes (Signed)
PROGRESS NOTE  [name] XYI:[id number] DOB: November 03, 1932 DOA: 05/28/2014 PCP: Gwendolyn Grant, MD  80 yo M brought to [facility] ED by EMS on 11/4 after have a change in mental status. Initially heard to be wheezing and had an episode of hypoxemia (82% on room air). Venturi mask was placed with no improvement. He was placed on 15 mL of NRB and sats improved to 96%. Found to be hypercarbic (7.17/99.2/99.0) and hypoxic on NRB, so placed on BiPAP. Failed to improved on BiPAP and was intubated in the ED.    Assessment/Plan: Acute respiratory failure- wean O2 as tolerated; wears Cpap at night but not O2 -still wheezing- added breathing treatments -extubated -abx  +rhino virus-  d/c droplet precautions  Fluid overload with normal EF -IV lasix -has diuresed about >8L since admission  HTN - ASA/plavix Metoprolol  Hx of moderate AS  CKD- stage IV- baseline 1.6  BPH -flomax  Elevated d dimer- 11/6 -V/Q scan negative for PE  Chronic spinal stenosis w/ chronic back pain: on lyrica and percocet  Hx of Restless leg syndrome: on ativan, requip, remeron  Constipation- added miralax, suppository and sennakot    Code Status: full Family Communication: patient Disposition Plan: SNF Thurs/Friday   Consultants:  tx out ICU- PCCM  cards  Procedures:  intubation    HPI/Subjective: Off O2 No CP, no SOB, ready to get out the hospital  Objective: Filed Vitals:   06/03/14 0759  BP: 122/47  Pulse: 62  Temp:   Resp: 14    Intake/Output Summary (Last 24 hours) at 06/03/14 0907 Last data filed at 06/02/14 1933  Gross per 24 hour  Intake      0 ml  Output    875 ml  Net   -875 ml   Filed Weights   06/01/14 0800 06/02/14 0308 06/03/14 0410  Weight: 112.8 kg (248 lb 10.9 oz) 106.4 kg (234 lb 9.1 oz) 105.5 kg (232 lb 9.4 oz)    Exam:   General:  Chronically ill appearing  Cardiovascular: rrr  Respiratory: minimal wheezing -right side  Abdomen: +BS,  soft  Musculoskeletal: +edema   Data Reviewed: Basic Metabolic Panel:  Recent Labs Lab 05/28/14 1849 05/29/14 0240 05/29/14 1619 05/30/14 0300 05/31/14 0446 06/01/14 0200 06/03/14 0019  NA  --  141 140 139 143 146 139  K  --  3.7 4.2 3.9 3.7 3.8 4.2  CL  --  97 100 100 101 102 96  CO2  --  '29 28 28 26 30 ' 33*  GLUCOSE  --  111* 108* 105* 125* 91 123*  BUN  --  47* 49* 47* 38* 29* 32*  CREATININE 1.93* 2.09* 2.37* 2.04* 1.48* 1.34 1.49*  CALCIUM  --  8.7 8.5 8.7 9.3 9.2 9.2  MG 2.3 1.9  --  2.1 1.9 2.0  --   PHOS  --  3.2  --  2.3 1.8* 3.0  --    Liver Function Tests: No results for input(s): AST, ALT, ALKPHOS, BILITOT, PROT, ALBUMIN in the last 168 hours.  Recent Labs Lab 05/29/14 0739  LIPASE 53  AMYLASE 292*   No results for input(s): AMMONIA in the last 168 hours. CBC:  Recent Labs Lab 05/28/14 1508  05/30/14 1250 05/31/14 0446 06/01/14 0200 06/02/14 0305 06/03/14 0019  WBC 5.6  < > 7.1 6.4 7.0 6.3 7.2  NEUTROABS 2.7  --   --   --   --   --   --   HGB 14.5  < >  13.4 13.4 13.9 13.7 13.9  HCT 46.4  < > 42.6 42.1 43.8 43.2 43.6  MCV 93.5  < > 92.4 91.3 93.6 92.7 92.6  PLT 103*  < > 101* 88* 102* 99* 107*  < > = values in this interval not displayed. Cardiac Enzymes:  Recent Labs Lab 05/29/14 0739 05/29/14 1316 05/29/14 1523 05/29/14 1954 05/30/14 0300  CKTOTAL 272*  --   --   --   --   TROPONINI 0.55* 0.37* 0.35* <0.30 <0.30   BNP (last 3 results)  Recent Labs  02/07/14 2148 03/20/14 0800 05/28/14 1508  PROBNP 325.5 762.3* 477.3*   CBG:  Recent Labs Lab 05/28/14 2039  GLUCAP 247*    Recent Results (from the past 240 hour(s))  Blood Culture (routine x 2)     Status: None   Collection Time: 05/28/14  3:16 PM  Result Value Ref Range Status   Specimen Description BLOOD LEFT ARM  Final   Special Requests BOTTLES DRAWN AEROBIC AND ANAEROBIC 5CC  Final   Culture  Setup Time   Final    05/28/2014 21:15 Performed at [place]   Final    NO GROWTH 5 DAYS Performed at [facility]    Report Status 06/03/2014 FINAL  Final  Blood Culture (routine x 2)     Status: None   Collection Time: 05/28/14  3:21 PM  Result Value Ref Range Status   Specimen Description BLOOD RIGHT ARM  Final   Special Requests BOTTLES DRAWN AEROBIC AND ANAEROBIC 5CC  Final   Culture  Setup Time   Final    05/28/2014 21:15 Performed at [place]   Final    NO GROWTH 5 DAYS Performed at [facility]    Report Status 06/03/2014 FINAL  Final  Urine culture     Status: None   Collection Time: 05/28/14  4:10 PM  Result Value Ref Range Status   Specimen Description URINE, CATHETERIZED  Final   Special Requests NONE  Final   Culture  Setup Time   Final    05/28/2014 21:52 Performed at [place] Performed at [facility]   Final   Culture NO GROWTH Performed at [facility]   Final   Report Status 05/29/2014 FINAL  Final  MRSA PCR Screening     Status: None   Collection Time: 05/28/14  8:31 PM  Result Value Ref Range Status   MRSA by PCR NEGATIVE NEGATIVE Final    Comment:        The GeneXpert MRSA Assay (FDA approved for NASAL specimens only), is one component of a comprehensive MRSA colonization surveillance program. It is not intended to diagnose MRSA infection nor to guide or monitor treatment for MRSA infections.   Respiratory virus panel (routine influenza)     Status: Abnormal   Collection Time: 05/29/14 10:17 AM  Result Value Ref Range Status   Source - RVPAN NASAL SWAB  Corrected    Comment: CORRECTED ON 11/09 AT 2115: PREVIOUSLY REPORTED AS NASAL SWAB   Respiratory Syncytial Virus A NOT DETECTED  Final   Respiratory Syncytial Virus B NOT DETECTED  Final   Influenza A NOT DETECTED  Final   Influenza B NOT DETECTED  Final   Parainfluenza 1 NOT DETECTED  Final   Parainfluenza 2 NOT DETECTED  Final    Parainfluenza 3 NOT DETECTED  Final   Metapneumovirus NOT DETECTED  Final   Rhinovirus DETECTED (A)  Final   Adenovirus NOT DETECTED  Final   Influenza A H1 NOT DETECTED  Final   Influenza A H3 NOT DETECTED  Final    Comment: (NOTE)       Normal Reference Range for each Analyte: NOT DETECTED Testing performed using the Luminex xTAG Respiratory Viral Panel test kit. The analytical performance characteristics of this assay have been determined by [facility].  The modifications have not been cleared or approved by the FDA. This assay has been validated pursuant to the CLIA regulations and is used for clinical purposes. Performed at [facility]      Studies: Nm Pulmonary Perf And Vent  06/02/2014   CLINICAL DATA:  Shortness of breath and acute respiratory failure  EXAM: NUCLEAR MEDICINE VENTILATION - PERFUSION LUNG SCAN  TECHNIQUE: Ventilation images were obtained in multiple projections using inhaled aerosol technetium 99 M DTPA. Perfusion images were obtained in multiple projections after intravenous injection of Tc-30mMAA.  RADIOPHARMACEUTICALS:  30 mCi Tc-91mTPA aerosol and 3 mCi Tc-9938mA  COMPARISON:  02/08/2014  FINDINGS: Ventilation: No focal ventilation defect.  Perfusion: No wedge shaped peripheral perfusion defects to suggest acute pulmonary embolism.  IMPRESSION: No evidence of ventilation perfusion defect to suggest pulmonary embolism is noted.   Electronically Signed   By: MarInez CatalinaD.   On: 06/02/2014 16:19    Scheduled Meds: . allopurinol  300 mg Oral q morning - 10a  . antiseptic oral rinse  7 mL Mouth Rinse q12n4p  . aspirin EC  81 mg Oral Daily  . buPROPion  150 mg Oral q morning - 10a  . chlorhexidine  15 mL Mouth Rinse BID  . cholecalciferol  1,000 Units Oral Daily  . clopidogrel  75 mg Oral Daily  . furosemide  40 mg Oral BID  . guaiFENesin  600 mg Oral BID  . Influenza vac split quadrivalent PF  0.5 mL Intramuscular Tomorrow-1000  .  levofloxacin (LEVAQUIN) IV  750 mg Intravenous Q24H  . metoprolol tartrate  25 mg Oral BID  . mirabegron ER  50 mg Oral Daily  . pantoprazole  40 mg Oral QHS  . polyethylene glycol  17 g Oral Daily  . pregabalin  200 mg Oral Daily  . rOPINIRole  4 mg Oral QHS  . tamsulosin  0.4 mg Oral Daily   Continuous Infusions:  Antibiotics Given (last 72 hours)    Date/Time Action Medication Dose Rate   06/01/14 1107 Given   levofloxacin (LEVAQUIN) IVPB 750 mg 750 mg 100 mL/hr   06/02/14 1148 Given   levofloxacin (LEVAQUIN) IVPB 750 mg 750 mg 100 mL/hr      Principal Problem:   Acute respiratory failure with hypoxia and hypercarbia Active Problems:   Type 2 diabetes mellitus with vascular disease   Obesity (BMI 30-39.9)   Encephalopathy acute   AKI (acute kidney injury)   Acute respiratory failure with hypercapnia    Time spent: 25 min    Kennya Schwenn  [facility] Pager [phone] 7PM-7AM, please contact night-coverage at [email or web address], password [email or web address]/05/2014, 9:07 AM  LOS: 6 days

## 2014-06-03 NOTE — Clinical Social Work Placement (Addendum)
Clinical Social Work Department CLINICAL SOCIAL WORK PLACEMENT NOTE 06/03/2014  Patient:  NIKITAS, SEEPERSAD  Account Number:  [id number] Admit date:  05/28/2014  Clinical Social Worker:  Ashok Cordia BIBBS, LCSWA  Date/time:  06/03/2014 11:44 AM  Clinical Social Work is seeking post-discharge placement for this patient at the following level of care:   SKILLED NURSING   (*CSW will update this form in Epic as items are completed)   06/03/2014  Patient/family provided with [facility] Department of Clinical Social Work's list of facilities offering this level of care within the geographic area requested by the patient (or if unable, by the patient's family).  06/03/2014  Patient/family informed of their freedom to choose among providers that offer the needed level of care, that participate in Medicare, Medicaid or managed care program needed by the patient, have an available bed and are willing to accept the patient.  06/03/2014  Patient/family informed of [facility]' ownership interest in [facility], as well as of the fact that they are under no obligation to receive care at this facility.  PASARR submitted to EDS on 06/03/2014 PASARR number received on   FL2 transmitted to all facilities in geographic area requested by pt/family on  06/03/2014 FL2 transmitted to all facilities within larger geographic area on 06/03/2014  Patient informed that his/her managed care company has contracts with or will negotiate with  certain facilities, including the following:     Patient/family informed of bed offers received: 06/04/2014  Patient chooses bed at [facility] Physician recommends and patient chooses bed at    Patient to be transferred to [facility] on Patient to be transferred to facility by family, Emmaline Kluver, son-in-law.  Patient and family notified of transfer on Friday, 06/05/14 Name of family member notified: Daughter Stanton Kidney and son-in-law Emmaline Kluver    The following physician request were entered in Epic:  Additional Comments: PASSAR is under manuel review.  06/04/2014; CSW-Intern contacted admission's director at [facility] to inform that Mr.Dikes will be discharging on Friday 06/05/2014. CSW-Intern spoke with daughter Ardell Isaacs to confirm discharge placement.  Arrie Aran CSW-Intern  [employer], MSW, [employer] [phone]

## 2014-06-03 NOTE — Progress Notes (Signed)
PT Cancellation Note  Patient Details Name: [name] MRN: [id number] DOB: Jul 15, 1933   Cancelled Treatment:    Reason Eval/Treat Not Completed: Other (comment);Patient declined, no reason specified (pt recently given suppository and starting to take effect.) 06/03/2014  [place]ie Bing, PT [phone]  (pager)  Dathan Attia, [name] 06/03/2014, 6:00 PM

## 2014-06-03 NOTE — Progress Notes (Signed)
Subjective:  Still weak, but no c/o of SOB or chest pain.   Objective:  Vital Signs in the last 24 hours: BP 122/47 mmHg  Pulse 62  Temp(Src) 98.1 F (36.7 C) (Oral)  Resp 14  Ht 5\' 11"  (1.803 m)  Wt 105.5 kg (232 lb 9.4 oz)  BMI 32.45 kg/m2  SpO2 97%  Physical Exam: Obese plethoric WM calm and in NAD Lungs:  Relatively clear now Cardiac: Regular rhythm, normal S1 and S2, no S3 2-3/6 harsh systolic murmur. Extremities:  1+edema present  Intake/Output from previous day: 11/10 0701 - 11/11 0700 In: -  Out: 875 [Urine:875]  Weight Filed Weights   06/01/14 0800 06/02/14 0308 06/03/14 0410  Weight: 112.8 kg (248 lb 10.9 oz) 106.4 kg (234 lb 9.1 oz) 105.5 kg (232 lb 9.4 oz)    Lab Results: Basic Metabolic Panel:  Recent Labs  97/98/92 0200 06/03/14 0019  NA 146 139  K 3.8 4.2  CL 102 96  CO2 30 33*  GLUCOSE 91 123*  BUN 29* 32*  CREATININE 1.34 1.49*   CBC:  Recent Labs  06/02/14 0305 06/03/14 0019  WBC 6.3 7.2  HGB 13.7 13.9  HCT 43.2 43.6  MCV 92.7 92.6  PLT 99* 107*    Telemetry: Sinus rhythm  Assessment/Plan:  1. Acute respiratory failure undetermined type possible viral pneumonia-appears to have resolved but still very weak 2. Hypotension resolved 3. Moderate aortic stenosis 4. Acute renal failure-continues to improve 5. CAD with prior stent 6. Low level elevation of troponin c/w demand ischemia 7. Thrombocytopenia persists 8. Bradycardia on admission likely due to acute respiratory failure  Rec:  Clinically better.  I think he would benefit from rehab or nursing home stay.  He will need to restart home meds.   Darden Palmer  MD [facility] Cardiology  06/03/2014, 9:01 AM

## 2014-06-03 NOTE — Progress Notes (Signed)
Patient to transfer to 6E10 report given to [facility] receiving RN.  Patient is stable at transfer.

## 2014-06-03 NOTE — Progress Notes (Signed)
Pt. Refuses CPAP at this time. Pt. Is aware to call if he changes his mind & decides to wear CPAP. RN is aware that pt. Refused.

## 2014-06-03 NOTE — Progress Notes (Signed)
PHARMACIST - PHYSICIAN COMMUNICATION  CONCERNING: Antibiotic IV to Oral Route Change Policy  RECOMMENDATION: This patient is receiving Levaquin by the intravenous route.  Based on criteria approved by the Pharmacy and Therapeutics Committee, the antibiotic(s) is/are being converted to the equivalent oral dose form(s).   DESCRIPTION: These criteria include:  Patient being treated for a respiratory tract infection, urinary tract infection, cellulitis or clostridium difficile associated diarrhea if on metronidazole  The patient is not neutropenic and does not exhibit a GI malabsorption state  The patient is eating (either orally or via tube) and/or has been taking other orally administered medications for a least 24 hours  The patient is improving clinically and has a Tmax < 100.5  If you have questions about this conversion, please contact the Pharmacy Department  []   [phone] )  [facility] []   [phone] )  [facility]  [x]   [phone] )  [facility] []   [phone] )  [facility]   Thank you, Harland German, [place] D 06/03/2014 11:36 AM

## 2014-06-04 LAB — CBC
HCT: 45 % (ref 39.0–52.0)
Hemoglobin: 14.4 g/dL (ref 13.0–17.0)
MCH: 29.6 pg (ref 26.0–34.0)
MCHC: 32 g/dL (ref 30.0–36.0)
MCV: 92.6 fL (ref 78.0–100.0)
Platelets: 127 10*3/uL — ABNORMAL LOW (ref 150–400)
RBC: 4.86 MIL/uL (ref 4.22–5.81)
RDW: 15.4 % (ref 11.5–15.5)
WBC: 7.2 10*3/uL (ref 4.0–10.5)

## 2014-06-04 MED ORDER — ZOLPIDEM TARTRATE 5 MG PO TABS
5.0000 mg | ORAL_TABLET | Freq: Every evening | ORAL | Status: DC | PRN
  Administered 2014-06-04: 5 mg via ORAL
  Filled 2014-06-04: qty 1

## 2014-06-04 MED ORDER — ONDANSETRON HCL 4 MG PO TABS
8.0000 mg | ORAL_TABLET | Freq: Three times a day (TID) | ORAL | Status: DC | PRN
  Administered 2014-06-04: 8 mg via ORAL
  Filled 2014-06-04: qty 2

## 2014-06-04 NOTE — Progress Notes (Signed)
Paged Dr. Benjamine Mola, pt nauseated and IV out and refused PT this am.  Pt stated they would come back.  Dr. Benjamine Mola returned call, OK to leave IV out for now and would give something PO for nausea.

## 2014-06-04 NOTE — Progress Notes (Signed)
PT Cancellation Note  Patient Details Name: [name] MRN: [id number] DOB: August 23, 1932   Cancelled Treatment:    Reason Eval/Treat Not Completed: Patient declined, no reason specified.  Pt states he cannot get up due to the suppository RN recently gave, as well as nausea. Pt was educated about benefits of OOB and need for PT evaluation for discharge planning. Pt continues to refuse and asks that therapist return closer to lunch. Will follow up as schedule allows.    Conni Slipper 06/04/2014, 10:47 AM   Conni Slipper, PT, DPT Acute Rehabilitation Services Pager: [phone]

## 2014-06-04 NOTE — Progress Notes (Signed)
PROGRESS NOTE  [name] BSJ:[id number] DOB: 1932-10-31 DOA: 05/28/2014 PCP: Gwendolyn Grant, MD  80 yo M brought to [facility] ED by EMS on 11/4 after have a change in mental status. Initially heard to be wheezing and had an episode of hypoxemia (82% on room air). Venturi mask was placed with no improvement. He was placed on 15 mL of NRB and sats improved to 96%. Found to be hypercarbic (7.17/99.2/99.0) and hypoxic on NRB, so placed on BiPAP. Failed to improved on BiPAP and was intubated in the ED.    Assessment/Plan: Acute respiratory failure- wean O2 as tolerated; wears Cpap at night but not O2 -still wheezing- added breathing treatments -extubated -abx  +rhino virus-  d/c droplet precautions  Fluid overload with normal EF -IV lasix -has diuresed about >8L since admission  HTN - ASA/plavix Metoprolol  Hx of moderate AS  CKD- stage IV- baseline 1.6  BPH -flomax  Elevated d dimer- 11/6 -V/Q scan negative for PE  Chronic spinal stenosis w/ chronic back pain: on lyrica and percocet  Hx of Restless leg syndrome: on ativan, requip, remeron  Constipation- added miralax, suppository and sennakot    Code Status: full Family Communication: daughter on phone Disposition Plan: SNF Friday once patient works for [employer]   Consultants:  tx out ICU- PCCM  cards  Procedures:  intubation    HPI/Subjective: C/o nausea  Objective: Filed Vitals:   06/04/14 0856  BP: 142/59  Pulse: 73  Temp: 97.8 F (36.6 C)  Resp: 20    Intake/Output Summary (Last 24 hours) at 06/04/14 1106 Last data filed at 06/04/14 0856  Gross per 24 hour  Intake    600 ml  Output    450 ml  Net    150 ml   Filed Weights   06/03/14 0410 06/03/14 1301 06/03/14 2109  Weight: 105.5 kg (232 lb 9.4 oz) 106.459 kg (234 lb 11.2 oz) 106.913 kg (235 lb 11.2 oz)    Exam:   General:  Chronically ill appearing  Cardiovascular: rrr  Respiratory: minimal wheezing -right side  Abdomen: +BS,  soft  Musculoskeletal: +edema   Data Reviewed: Basic Metabolic Panel:  Recent Labs Lab 05/28/14 1849 05/29/14 0240 05/29/14 1619 05/30/14 0300 05/31/14 0446 06/01/14 0200 06/03/14 0019  NA  --  141 140 139 143 146 139  K  --  3.7 4.2 3.9 3.7 3.8 4.2  CL  --  97 100 100 101 102 96  CO2  --  _0 33*  GLUCOSE  --  111* 108* 105* 125* 91 123*  BUN  --  47* 49* 47* 38* 29* 32*  CREATININE 1.93* 2.09* 2.37* 2.04* 1.48* 1.34 1.49*  CALCIUM  --  8.7 8.5 8.7 9.3 9.2 9.2  MG 2.3 1.9  --  2.1 1.9 2.0  --   PHOS  --  3.2  --  2.3 1.8* 3.0  --    Liver Function Tests: No results for input(s): AST, ALT, ALKPHOS, BILITOT, PROT, ALBUMIN in the last 168 hours.  Recent Labs Lab 05/29/14 0739  LIPASE 53  AMYLASE 292*   No results for input(s): AMMONIA in the last 168 hours. CBC:  Recent Labs Lab 05/28/14 1508  05/31/14 0446 06/01/14 0200 06/02/14 0305 06/03/14 0019 06/04/14 0520  WBC 5.6  < > 6.4 7.0 6.3 7.2 7.2  NEUTROABS 2.7  --   --   --   --   --   --   HGB 14.5  < >  13.4 13.9 13.7 13.9 14.4  HCT 46.4  < > 42.1 43.8 43.2 43.6 45.0  MCV 93.5  < > 91.3 93.6 92.7 92.6 92.6  PLT 103*  < > 88* 102* 99* 107* 127*  < > = values in this interval not displayed. Cardiac Enzymes:  Recent Labs Lab 05/29/14 0739 05/29/14 1316 05/29/14 1523 05/29/14 1954 05/30/14 0300  CKTOTAL 272*  --   --   --   --   TROPONINI 0.55* 0.37* 0.35* <0.30 <0.30   BNP (last 3 results)  Recent Labs  02/07/14 2148 03/20/14 0800 05/28/14 1508  PROBNP 325.5 762.3* 477.3*   CBG:  Recent Labs Lab 05/28/14 2039  GLUCAP 247*    Recent Results (from the past 240 hour(s))  Blood Culture (routine x 2)     Status: None   Collection Time: 05/28/14  3:16 PM  Result Value Ref Range Status   Specimen Description BLOOD LEFT ARM  Final   Special Requests BOTTLES DRAWN AEROBIC AND ANAEROBIC 5CC  Final   Culture  Setup Time   Final    05/28/2014 21:15 Performed at [place]   Final    NO GROWTH 5 DAYS Performed at [facility]    Report Status 06/03/2014 FINAL  Final  Blood Culture (routine x 2)     Status: None   Collection Time: 05/28/14  3:21 PM  Result Value Ref Range Status   Specimen Description BLOOD RIGHT ARM  Final   Special Requests BOTTLES DRAWN AEROBIC AND ANAEROBIC 5CC  Final   Culture  Setup Time   Final    05/28/2014 21:15 Performed at [place]   Final    NO GROWTH 5 DAYS Performed at [facility]    Report Status 06/03/2014 FINAL  Final  Urine culture     Status: None   Collection Time: 05/28/14  4:10 PM  Result Value Ref Range Status   Specimen Description URINE, CATHETERIZED  Final   Special Requests NONE  Final   Culture  Setup Time   Final    05/28/2014 21:52 Performed at [place] Performed at [facility]   Final   Culture NO GROWTH Performed at [facility]   Final   Report Status 05/29/2014 FINAL  Final  MRSA PCR Screening     Status: None   Collection Time: 05/28/14  8:31 PM  Result Value Ref Range Status   MRSA by PCR NEGATIVE NEGATIVE Final    Comment:        The GeneXpert MRSA Assay (FDA approved for NASAL specimens only), is one component of a comprehensive MRSA colonization surveillance program. It is not intended to diagnose MRSA infection nor to guide or monitor treatment for MRSA infections.   Respiratory virus panel (routine influenza)     Status: Abnormal   Collection Time: 05/29/14 10:17 AM  Result Value Ref Range Status   Source - RVPAN NASAL SWAB  Corrected    Comment: CORRECTED ON 11/09 AT 2115: PREVIOUSLY REPORTED AS NASAL SWAB   Respiratory Syncytial Virus A NOT DETECTED  Final   Respiratory Syncytial Virus B NOT DETECTED  Final   Influenza A NOT DETECTED  Final   Influenza B NOT DETECTED  Final   Parainfluenza 1 NOT DETECTED  Final   Parainfluenza 2 NOT DETECTED  Final    Parainfluenza 3 NOT DETECTED  Final   Metapneumovirus NOT DETECTED  Final   Rhinovirus DETECTED (A)  Final   Adenovirus NOT DETECTED  Final   Influenza A H1 NOT DETECTED  Final   Influenza A H3 NOT DETECTED  Final    Comment: (NOTE)       Normal Reference Range for each Analyte: NOT DETECTED Testing performed using the Luminex xTAG Respiratory Viral Panel test kit. The analytical performance characteristics of this assay have been determined by [facility].  The modifications have not been cleared or approved by the FDA. This assay has been validated pursuant to the CLIA regulations and is used for clinical purposes. Performed at [facility]      Studies: Nm Pulmonary Perf And Vent  06/02/2014   CLINICAL DATA:  Shortness of breath and acute respiratory failure  EXAM: NUCLEAR MEDICINE VENTILATION - PERFUSION LUNG SCAN  TECHNIQUE: Ventilation images were obtained in multiple projections using inhaled aerosol technetium 99 M DTPA. Perfusion images were obtained in multiple projections after intravenous injection of Tc-75mMAA.  RADIOPHARMACEUTICALS:  30 mCi Tc-951mTPA aerosol and 3 mCi Tc-9961mA  COMPARISON:  02/08/2014  FINDINGS: Ventilation: No focal ventilation defect.  Perfusion: No wedge shaped peripheral perfusion defects to suggest acute pulmonary embolism.  IMPRESSION: No evidence of ventilation perfusion defect to suggest pulmonary embolism is noted.   Electronically Signed   By: MarInez CatalinaD.   On: 06/02/2014 16:19    Scheduled Meds: . allopurinol  300 mg Oral q morning - 10a  . antiseptic oral rinse  7 mL Mouth Rinse q12n4p  . aspirin EC  81 mg Oral Daily  . buPROPion  150 mg Oral q morning - 10a  . chlorhexidine  15 mL Mouth Rinse BID  . cholecalciferol  1,000 Units Oral Daily  . clopidogrel  75 mg Oral Daily  . furosemide  40 mg Oral BID  . guaiFENesin  600 mg Oral BID  . Influenza vac split quadrivalent PF  0.5 mL Intramuscular Tomorrow-1000  .  levofloxacin  750 mg Oral Daily  . metoprolol tartrate  25 mg Oral BID  . mirabegron ER  50 mg Oral Daily  . pantoprazole  40 mg Oral QHS  . polyethylene glycol  17 g Oral Daily  . pregabalin  200 mg Oral Daily  . rOPINIRole  4 mg Oral QHS  . senna-docusate  1 tablet Oral BID  . tamsulosin  0.4 mg Oral Daily   Continuous Infusions:  Antibiotics Given (last 72 hours)    Date/Time Action Medication Dose Rate   06/01/14 1107 Given   levofloxacin (LEVAQUIN) IVPB 750 mg 750 mg 100 mL/hr   06/02/14 1148 Given   levofloxacin (LEVAQUIN) IVPB 750 mg 750 mg 100 mL/hr   06/03/14 1123 Given   levofloxacin (LEVAQUIN) IVPB 750 mg 750 mg 100 mL/hr      Principal Problem:   Acute respiratory failure with hypoxia and hypercarbia Active Problems:   Type 2 diabetes mellitus with vascular disease   Obesity (BMI 30-39.9)   Encephalopathy acute   AKI (acute kidney injury)   Acute respiratory failure with hypercapnia    Time spent: 25 min    Alfreida Steffenhagen  [facility] Pager [phone] 7PM-7AM, please contact night-coverage at [email or web address], password [email or web address]/06/2014, 11:06 AM  LOS: 7 days

## 2014-06-04 NOTE — Plan of Care (Signed)
Problem: Phase I Progression Outcomes Goal: Pneumonia/flu vaccination screen completed Outcome: Completed/Met Date Met:  06/04/14 Goal: Pain controlled with appropriate interventions Outcome: Completed/Met Date Met:  06/04/14 Goal: Hemodynamically stable Outcome: Completed/Met Date Met:  06/04/14 Goal: Patient tolerating nututrition at goal Outcome: Completed/Met Date Met:  06/04/14 Goal: Voiding-avoid urinary catheter unless indicated Outcome: Completed/Met Date Met:  06/04/14

## 2014-06-04 NOTE — Evaluation (Signed)
Physical Therapy Evaluation Patient Details Name: Tyjai Armenia MRN: [id number] DOB: 1932-11-26 Today's Date: 06/04/2014   History of Present Illness  Pt is a 80 yo M brought to [facility] ED by EMS on 05/27/14 after a change in mental status. Initially heard to be wheezing and had an episode of hypoxemia (82% on room air). Venturi mask was placed with no improvement. He was placed on 15 mL of NRB and sats improved to 96%. Found to be hypercarbic and hypoxic on NRB, so placed on BiPAP. Failed to improved on BiPAP and was intubated in the ED - extubated on 05/31/14.   Clinical Impression  Pt admitted with the above. Pt currently with functional limitations due to the deficits listed below (see PT Problem List). At the time of PT eval pt was able to perform transfers and ambulation with min assist and RW for support/energy conservation. Pt will benefit from skilled PT to increase their independence and safety with mobility to allow discharge to the venue listed below.     Follow Up Recommendations SNF;Supervision/Assistance - 24 hour    Equipment Recommendations  Rolling walker with 5" wheels;3in1 (PT)    Recommendations for Other Services       Precautions / Restrictions Precautions Precautions: Fall Restrictions Weight Bearing Restrictions: No      Mobility  Bed Mobility Overal bed mobility: Needs Assistance Bed Mobility: Supine to Sit     Supine to sit: Min assist     General bed mobility comments: Assist for trunk elevation to full sitting position.   Transfers Overall transfer level: Needs assistance Equipment used: Rolling walker (2 wheeled) Transfers: Sit to/from Stand Sit to Stand: Min assist         General transfer comment: VC's for hand placement on seated surface for safety. Pt was able to power-up to full standing position with min assist for steadying and initiation of movement.   Ambulation/Gait Ambulation/Gait assistance: Min guard Ambulation Distance (Feet):  75 Feet Assistive device: Rolling walker (2 wheeled) Gait Pattern/deviations: Step-through pattern;Decreased stride length;Trunk flexed Gait velocity: Decreased Gait velocity interpretation: Below normal speed for age/gender General Gait Details: Pt was able to ambulate in hallway with close guard for safety, and RW for UE support.   Stairs            Wheelchair Mobility    Modified Rankin (Stroke Patients Only)       Balance Overall balance assessment: Needs assistance;History of Falls Sitting-balance support: Feet supported;No upper extremity supported Sitting balance-Leahy Scale: Fair     Standing balance support: Bilateral upper extremity supported;During functional activity Standing balance-Leahy Scale: Poor Standing balance comment: Pt requires UE support to maintain standing balance at this time.                              Pertinent Vitals/Pain Pain Assessment: No/denies pain    Home Living Family/patient expects to be discharged to:: Skilled nursing facility Living Arrangements: Children               Additional Comments: Equipment belonged to wife - patient has never used it    Prior Function Level of Independence: Independent         Comments: Children help with IADLs - cooking, cleaning, etc. Pt drives. Pt works in his [employer] daily, uses golf cart to drive to/from shop in yard. Reports fall due to dizziness.     Hand Dominance   Dominant Hand: Right  Extremity/Trunk Assessment   Upper Extremity Assessment: Defer to OT evaluation           Lower Extremity Assessment: Generalized weakness      Cervical / Trunk Assessment: Normal  Communication   Communication: No difficulties  Cognition Arousal/Alertness: Awake/alert Behavior During Therapy: WFL for tasks assessed/performed Overall Cognitive Status: Within Functional Limits for tasks assessed                      General Comments      Exercises         Assessment/Plan    PT Assessment Patient needs continued PT services  PT Diagnosis Difficulty walking;Generalized weakness   PT Problem List Decreased strength;Decreased range of motion;Decreased activity tolerance;Decreased balance;Decreased mobility;Decreased knowledge of use of DME;Decreased safety awareness;Decreased knowledge of precautions  PT Treatment Interventions DME instruction;Gait training;Stair training;Functional mobility training;Therapeutic activities;Therapeutic exercise;Neuromuscular re-education;Patient/family education   PT Goals (Current goals can be found in the Care Plan section) Acute Rehab PT Goals Patient Stated Goal: To return home after stay at rehab PT Goal Formulation: With patient Time For Goal Achievement: 06/11/14 Potential to Achieve Goals: Good    Frequency Min 2X/week   Barriers to discharge        Co-evaluation               End of Session Equipment Utilized During Treatment: Gait belt Activity Tolerance: Patient tolerated treatment well Patient left: in chair;with call bell/phone within reach Nurse Communication: Mobility status         Time: [id number] PT Time Calculation (min) (ACUTE ONLY): 14 min   Charges:   PT Evaluation $Initial PT Evaluation Tier I: 1 Procedure PT Treatments $Gait Training: 8-22 mins $Therapeutic Activity: 8-22 mins   PT G Codes:          Conni SlipperKirkman, Dakoda [place] 06/04/2014, 1:38 PM   Conni SlipperLaura Tallia Moehring, PT, DPT Acute Rehabilitation Services Pager: [phone]

## 2014-06-04 NOTE — Progress Notes (Signed)
Subjective:  Still coughing.  No complaints of shortness of breath or chest pain.  Did not walk with physical therapy yesterday.  He would like to go to [facility]. And arrangements are being made for him to do that.  Objective:  Vital Signs in the last 24 hours: BP 135/63 mmHg  Pulse 68  Temp(Src) 97.3 F (36.3 C) (Oral)  Resp 20  Ht 5\' 10"  (1.778 m)  Wt 106.913 kg (235 lb 11.2 oz)  BMI 33.82 kg/m2  SpO2 90%  Physical Exam: Obese plethoric WM calm and in NAD Lungs:  Relatively clear now Cardiac: Regular rhythm, normal S1 and S2, no S3 2-3/6 harsh systolic murmur. Extremities:  No edema present.  Front of legs bandaged.  Intake/Output from previous day: 11/11 0701 - 11/12 0700 In: 480 [P.O.:480] Out: 450 [Urine:450]  Weight Filed Weights   06/03/14 0410 06/03/14 1301 06/03/14 2109  Weight: 105.5 kg (232 lb 9.4 oz) 106.459 kg (234 lb 11.2 oz) 106.913 kg (235 lb 11.2 oz)    Lab Results: Basic Metabolic Panel:  Recent Labs  41/28/78 0019  NA 139  K 4.2  CL 96  CO2 33*  GLUCOSE 123*  BUN 32*  CREATININE 1.49*   CBC:  Recent Labs  06/03/14 0019 06/04/14 0520  WBC 7.2 7.2  HGB 13.9 14.4  HCT 43.6 45.0  MCV 92.6 92.6  PLT 107* 127*    Telemetry: Sinus rhythm  Assessment/Plan:  1. Acute respiratory failure undetermined type possible viral pneumonia-appears to have resolved but still very weak 2. Hypotension resolved 3. Moderate aortic stenosis 4. Acute renal failure on chronicstabilizing now5. CAD with prior stent 6. Low level elevation of troponin c/w demand ischemia 7. Thrombocytopenia Improving 8. Bradycardia on admission likely due to acute respiratory failure  Rec:  Discussed need to or per physical therapy and try to get out of the bed.  Continue treatment for suspected pneumonia.  Will obviously need some intensive rehabilitation prior to going back home.  Darden Palmer  MD [facility] Cardiology  06/04/2014, 8:47 AM

## 2014-06-05 DIAGNOSIS — G2581 Restless legs syndrome: Secondary | ICD-10-CM

## 2014-06-05 LAB — BASIC METABOLIC PANEL
Anion gap: 13 (ref 5–15)
BUN: 37 mg/dL — ABNORMAL HIGH (ref 6–23)
CHLORIDE: 103 meq/L (ref 96–112)
CO2: 27 mEq/L (ref 19–32)
Calcium: 9.2 mg/dL (ref 8.4–10.5)
Creatinine, Ser: 1.65 mg/dL — ABNORMAL HIGH (ref 0.50–1.35)
GFR calc Af Amer: 44 mL/min — ABNORMAL LOW (ref >90)
GFR calc non Af Amer: 38 mL/min — ABNORMAL LOW (ref >90)
Glucose, Bld: 96 mg/dL (ref 70–99)
Potassium: 4.1 mEq/L (ref 3.7–5.3)
Sodium: 143 mEq/L (ref 137–147)

## 2014-06-05 LAB — CBC
HCT: 42 % (ref 39.0–52.0)
Hemoglobin: 13.7 g/dL (ref 13.0–17.0)
MCH: 29.4 pg (ref 26.0–34.0)
MCHC: 32.6 g/dL (ref 30.0–36.0)
MCV: 90.1 fL (ref 78.0–100.0)
Platelets: 135 10*3/uL — ABNORMAL LOW (ref 150–400)
RBC: 4.66 MIL/uL (ref 4.22–5.81)
RDW: 15.3 % (ref 11.5–15.5)
WBC: 7.7 10*3/uL (ref 4.0–10.5)

## 2014-06-05 MED ORDER — ALBUTEROL SULFATE (2.5 MG/3ML) 0.083% IN NEBU: 2.5000 mg | INHALATION_SOLUTION | RESPIRATORY_TRACT | Status: DC | PRN

## 2014-06-05 MED ORDER — OXYCODONE-ACETAMINOPHEN 10-325 MG PO TABS: 1.0000 | ORAL_TABLET | Freq: Three times a day (TID) | ORAL | Status: DC | PRN

## 2014-06-05 MED ORDER — FUROSEMIDE 40 MG PO TABS: 40.0000 mg | ORAL_TABLET | Freq: Two times a day (BID) | ORAL | Status: DC

## 2014-06-05 MED ORDER — BISACODYL 10 MG RE SUPP: 10.0000 mg | Freq: Every day | RECTAL | Status: DC | PRN

## 2014-06-05 MED ORDER — LORAZEPAM 1 MG PO TABS: 1.0000 mg | ORAL_TABLET | Freq: Every morning | ORAL | Status: DC

## 2014-06-05 MED ORDER — METOPROLOL TARTRATE 25 MG PO TABS: 25.0000 mg | ORAL_TABLET | Freq: Two times a day (BID) | ORAL | Status: DC

## 2014-06-05 MED ORDER — ONDANSETRON HCL 8 MG PO TABS: 8.0000 mg | ORAL_TABLET | Freq: Three times a day (TID) | ORAL | Status: DC | PRN

## 2014-06-05 NOTE — Progress Notes (Signed)
Pt. Continues to refuse BIPAP at this time. Pt. Is aware to inform RT or RN anytime during the night if he changes his mind & decides to wear BIPAP.

## 2014-06-05 NOTE — Progress Notes (Signed)
Subjective:  Coughing improves.  Not currently dyspneic.  Overall, regaining strength.  Some worsening of creatinine with diuresis today.  Weight also continues to go up.  Unclear of the significance of that.  Objective:  Vital Signs in the last 24 hours: BP 122/58 mmHg  Pulse 67  Temp(Src) 98 F (36.7 C) (Oral)  Resp 18  Ht 5\' 10"  (1.778 m)  Wt 107.775 kg (237 lb 9.6 oz)  BMI 34.09 kg/m2  SpO2 91%  Physical Exam: Obese plethoric WM calm and in NAD Lungs:  Relatively clear now Cardiac: Regular rhythm, normal S1 and S2, no S3 2-3/6 harsh systolic murmur. Extremities:  No edema present.  Front of legs bandaged.  Intake/Output from previous day: 11/12 0701 - 11/13 0700 In: 600 [P.O.:600] Out: 800 [Urine:800]  Weight Filed Weights   06/03/14 1301 06/03/14 2109 06/04/14 2100  Weight: 106.459 kg (234 lb 11.2 oz) 106.913 kg (235 lb 11.2 oz) 107.775 kg (237 lb 9.6 oz)    Lab Results: Basic Metabolic Panel:  Recent Labs  51/10/21 0019 06/05/14 0425  NA 139 143  K 4.2 4.1  CL 96 103  CO2 33* 27  GLUCOSE 123* 96  BUN 32* 37*  CREATININE 1.49* 1.65*   CBC:  Recent Labs  06/04/14 0520 06/05/14 0425  WBC 7.2 7.7  HGB 14.4 13.7  HCT 45.0 42.0  MCV 92.6 90.1  PLT 127* 135*    Telemetry: Sinus rhythm  Assessment/Plan:  1. Acute respiratory failure undetermined type possible viral pneumonia-appears to have resolved but still very weakplans to go to rehabilitation 2. Hypotension resolved 3. Moderate aortic stenosis 4. Acute renal failure on chronicstabilizing now5. CAD with prior stent 6. Low level elevation of troponin c/w demand ischemia 7. Thrombocytopenia Improving may have been due to underlying infection 8. Bradycardia on admission likely due to acute respiratory failure this appears to have resolved.  Rec:  Hopefully, goes to [facility] today.  Should continue home medications.  Call if problems.  Darden Palmer  MD  [facility] Cardiology  06/05/2014, 8:54 AM

## 2014-06-05 NOTE — Progress Notes (Signed)
Pt left floor via wheelchair accompanied by family and staff.

## 2014-06-05 NOTE — Progress Notes (Signed)
Called report to Dixie Dials, LPN [facility] in [place].

## 2014-06-05 NOTE — Care Management Note (Signed)
CARE MANAGEMENT NOTE 06/05/2014  Patient:  [name], [name]   Account Number:  [id number]  Date Initiated:  05/29/2014  Documentation initiated by:  [facility]  Subjective/Objective Assessment:   Admitted with resp failure - intubated.     Action/Plan:   06/05/2014 Pt to d/c to SNF today, IM given and explained.   Anticipated DC Date:  06/05/2014   Anticipated DC Plan:  SKILLED NURSING FACILITY  In-house referral  Clinical Social Worker      DC Planning Services  CM consult      Choice offered to / List presented to:             Status of service:  Completed, signed off Medicare Important Message given?  YES (If response is "NO", the following Medicare IM given date fields will be blank) Date Medicare IM given:  06/01/2014 Medicare IM given by:  [facility] Date Additional Medicare IM given:  06/05/2014 Additional Medicare IM given by:  [facility]  Discharge Disposition:    Per UR Regulation:  Reviewed for med. necessity/level of care/duration of stay  If discussed at Long Length of Stay Meetings, dates discussed:   06/04/2014    Comments:  Contact: [name],[name] Daughter [phone]  [phone]                  [name], [name]   [phone]  06/03/14- 1500- Donn Pierini RN, BSN [phone] Per pt he prefers SNF placement for rehab (preference for [facility]), CSW aware and working on placement. D/c plan for Thur or Friday of this week.  06-01-14 9:50am Avie Arenas, RNBSN [phone] Extubated on 05-31-14.  on 6L [place].  confirmed that he does live at home alone.  Has used [facility] in past.  Discussed rehab prior to going home.  Is agreeable to that.  PT to eval.  CM will continue to follow.  05-29-14  3:40pm Avie Arenas, RNBSN [phone] Lives at home alone - has support of family that per nurse do assist post discharge.  CM will continue to follow.
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
## 2014-06-22 ENCOUNTER — Telehealth: Payer: Self-pay | Admitting: Internal Medicine

## 2014-06-22 NOTE — Telephone Encounter (Signed)
LVM w/verbal ok for OT as stated below

## 2014-06-22 NOTE — Telephone Encounter (Signed)
Needs to see patients 1 x week for 5 week for ADL retraining, home safety and developing a home excercise program for improving activity tolerance and motor control.

## 2014-06-23 ENCOUNTER — Telehealth: Payer: Self-pay | Admitting: Internal Medicine

## 2014-06-23 NOTE — Telephone Encounter (Signed)
[name]/[facility] needs verbal orders to continue services for home health.

## 2014-06-23 NOTE — Telephone Encounter (Signed)
Verbal okay given for pt to continue home health per PCP.

## 2014-06-23 NOTE — Telephone Encounter (Signed)
Rolly Salter, physical therapist calling to get verbal orders, 2x a wk for 4 wks.

## 2014-06-23 NOTE — Telephone Encounter (Signed)
Verbal ok given on VM for [facility]. Instructed to call back with any questions.

## 2014-06-29 ENCOUNTER — Encounter: Payer: Self-pay | Admitting: Internal Medicine

## 2014-06-29 ENCOUNTER — Ambulatory Visit (INDEPENDENT_AMBULATORY_CARE_PROVIDER_SITE_OTHER): Payer: Medicare Other | Admitting: Internal Medicine

## 2014-06-29 VITALS — BP 102/60 | HR 61 | Temp 97.9°F | Resp 15 | Wt 247.0 lb

## 2014-06-29 DIAGNOSIS — K2100 Gastro-esophageal reflux disease with esophagitis, without bleeding: Secondary | ICD-10-CM

## 2014-06-29 DIAGNOSIS — J9602 Acute respiratory failure with hypercapnia: Principal | ICD-10-CM

## 2014-06-29 DIAGNOSIS — J9601 Acute respiratory failure with hypoxia: Secondary | ICD-10-CM

## 2014-06-29 DIAGNOSIS — G4733 Obstructive sleep apnea (adult) (pediatric): Secondary | ICD-10-CM

## 2014-06-29 NOTE — Assessment & Plan Note (Signed)
The triggers for reflux  include stress; the "aspirin family" ; alcohol; peppermint; and caffeine (coffee, tea, cola, and chocolate). The aspirin family would include aspirin and the nonsteroidal agents such as ibuprofen &  Naproxen. Tylenol would not cause reflux. If having symptoms ; food & drink should be avoided for @ least 2 hours before going to bed.  Add Zantac 150 mg @ bedtime until symptoms controlled

## 2014-06-29 NOTE — Progress Notes (Signed)
Pre visit review using our clinic review tool, if applicable. No additional management support is needed unless otherwise documented below in the visit note. 

## 2014-06-29 NOTE — Progress Notes (Signed)
   Subjective:    Patient ID: [name], male    DOB: 06-Jan-1933, 80 y.o.   MRN: [id number]  HPI   Hospital records 11/5-11/13/15 were reviewed. He was admitted with acute encephalopathy in the context of acute respiratory failure associated with hypoxemia and hypercarbia. O2 sats were 82% on room air. The history and physical does describe bronchospasm with active wheezing . Chest x-ray revealed cardiac enlargement & edema. He did require intubation.  Following discharge was sent to a skilled nursing facility for 10 days.  He states that he is using his CPAP at least 4 hours nightly. The machine is 78 years old; he questions whether it is functioning properly. He has not been seen by a sleep specialist for at least a year.  His daughter stated that he previously had been observed to be snoring excessively and gasping intermittently  He has 2 daughters and one son who live in his community and they are taking turns staying in his home at night.  He does  have a blood pressure cuff. He has not started monitoring blood pressure. His metoprolol was decreased to one half pill twice a day at the nursing facility.    Review of Systems   He does describe some dyspnea when he first lies supine at night.  His exertional dyspnea is stable.  Albuterol is on his med list ;but he states this was not provided.  He denies other cardiopulmonary symptoms.  Chest pain, palpitations, tachycardia,  claudication or edema are absent. Some dysphagia.  He's also had some upper epigastric burning.  He denies any unexplained weight loss, melena, rectal bleeding. He states he is taking his generic Protonix.       Objective:   Physical Exam    Pertinent positive findings include: Facial plethora is present He has bilateral ptosis There is anisoria of pupils. Poor dentition is present External nasal deformity present. Breath sounds are markedly decreased in all lung fields There is a distant  systolic murmur in the epigastrium. Abdomen is massively protuberant He has flexion contractures of fingers. He has scattered ecchymoses. There keratotic lesions over the forearms & linear excoriations over the abdomen. He has intermittent asterixis of the upper extremities. There is tense edema of the lower extremities with decreased pedal pulses.  General appearance :adequately nourished; in no distress. Eyes: No conjunctival inflammation or scleral icterus is present. Oral exam: Lips and gums are healthy appearing.There is no oropharyngeal erythema or exudate noted.  Heart: Distant heart sounds. Normal rate and regular rhythm. S1 and S2 normal without gallop, click, rub or other extra sounds   Lungs:Chest clear to auscultation; no wheezes, rhonchi,rales ,or rubs present.No increased work of breathing.  Abdomen: bowel sounds normal, soft and non-tender without masses, organomegaly or hernias noted.  No guarding or rebound.  Vascular : all pulses equal ; no bruits present. Skin:Warm & dry.  Intact without suspicious lesions or rashes ; no jaundice or tenting Lymphatic: No lymphadenopathy is noted about the head, neck, axilla        Assessment & Plan:  See Current Assessment & Plan in Problem List under specific Diagnosis

## 2014-06-29 NOTE — Assessment & Plan Note (Signed)
Referral to Dr Shelle Iron He can address COPD status as well

## 2014-06-29 NOTE — Patient Instructions (Addendum)
Reflux of gastric acid may be asymptomatic as this may occur mainly during sleep.The triggers for reflux  include stress; the "aspirin family" ; alcohol; peppermint; and caffeine (coffee, tea, cola, and chocolate). The aspirin family would include aspirin and the nonsteroidal agents such as ibuprofen &  Naproxen. Tylenol would not cause reflux. If having symptoms ; food & drink should be avoided for @ least 2 hours before going to bed. Take the ranitidine 150 mg 30 minutes before bedtime.  Anoro one inhalation daily; gargle and spit after use. Lot #:[id number] Exp 02/17 Instructions written on box by Dr Lennart Pall

## 2014-06-29 NOTE — Assessment & Plan Note (Signed)
Pul/CC follow up for optimal assessment

## 2014-07-01 DIAGNOSIS — I2510 Atherosclerotic heart disease of native coronary artery without angina pectoris: Secondary | ICD-10-CM

## 2014-07-01 DIAGNOSIS — I5030 Unspecified diastolic (congestive) heart failure: Secondary | ICD-10-CM

## 2014-07-01 DIAGNOSIS — I129 Hypertensive chronic kidney disease with stage 1 through stage 4 chronic kidney disease, or unspecified chronic kidney disease: Secondary | ICD-10-CM | POA: Diagnosis not present

## 2014-07-01 DIAGNOSIS — E119 Type 2 diabetes mellitus without complications: Secondary | ICD-10-CM | POA: Diagnosis not present

## 2014-07-01 DIAGNOSIS — I739 Peripheral vascular disease, unspecified: Secondary | ICD-10-CM

## 2014-07-08 ENCOUNTER — Ambulatory Visit (INDEPENDENT_AMBULATORY_CARE_PROVIDER_SITE_OTHER): Payer: Medicare Other | Admitting: Pulmonary Disease

## 2014-07-08 ENCOUNTER — Encounter: Payer: Self-pay | Admitting: Pulmonary Disease

## 2014-07-08 ENCOUNTER — Telehealth: Payer: Self-pay | Admitting: Internal Medicine

## 2014-07-08 VITALS — BP 128/70 | HR 71 | Temp 97.8°F | Ht 69.0 in | Wt 251.2 lb

## 2014-07-08 DIAGNOSIS — I2584 Coronary atherosclerosis due to calcified coronary lesion: Secondary | ICD-10-CM

## 2014-07-08 DIAGNOSIS — G4733 Obstructive sleep apnea (adult) (pediatric): Secondary | ICD-10-CM

## 2014-07-08 DIAGNOSIS — I2510 Atherosclerotic heart disease of native coronary artery without angina pectoris: Secondary | ICD-10-CM

## 2014-07-08 NOTE — Progress Notes (Signed)
   Subjective:    Patient ID: [name], male    DOB: [birth year], 80 y.o.   MRN: [id number]  HPI The patient comes in today for follow-up of his known obstructive sleep apnea. He tells me that he is wearing the device compliantly, and that it has definitely helped his sleep. Despite this, he still has nights where he has disrupted sleep, and also daytime fatigue. Complicating all of this are his other underlying medical issues and chronic pain syndrome. The patient states that he has not had a download from his machine since the last visit a year ago. However, he has kept up with his mask changes and supplies. Of note, the patient's weight continues to increase.   Review of Systems  Constitutional: Negative for fever and unexpected weight change.  HENT: Negative for congestion, dental problem, ear pain, nosebleeds, postnasal drip, rhinorrhea, sinus pressure, sneezing, sore throat and trouble swallowing.   Eyes: Negative for redness and itching.  Respiratory: Positive for shortness of breath and wheezing. Negative for cough and chest tightness.   Cardiovascular: Negative for palpitations and leg swelling.  Gastrointestinal: Negative for nausea and vomiting.  Genitourinary: Negative for dysuria.  Musculoskeletal: Negative for joint swelling.  Skin: Negative for rash.  Neurological: Negative for headaches.  Hematological: Does not bruise/bleed easily.  Psychiatric/Behavioral: Negative for dysphoric mood. The patient is not nervous/anxious.        Objective:   Physical Exam Obese male in nad Nose without purulence or d/c noted. No skin breakdown or pressure necrosis from the cpap mask Neck without LN or TMG LE with edema noted, no cyanosis Alert and oriented, moves all 4.        Assessment & Plan:

## 2014-07-08 NOTE — Assessment & Plan Note (Signed)
The patient is wearing C Pap compliantly by his history, and does feel that it continues to help his sleep. He has not had a download since last year, and will call ahead and get this done by his home care company. This will allow [place] to check on compliance, check on mask leaks, and make sure that his pressure is adequate. It has always been difficult to judge the effect of his C Pap on sleep and daytime alertness because of his other medical issues which can contribute to sleep disruption and daytime fatigue. Finally, I have encouraged him to work aggressively on weight loss, and to keep up with his mask changes and supplies.

## 2014-07-08 NOTE — Telephone Encounter (Signed)
[facility] nurse calling to state pt continues to have elev BP, his BP med was cut in half by nursing home. Pt requesting to go back on prev dose,more short winded.   [phone]

## 2014-07-08 NOTE — Telephone Encounter (Signed)
LVM Jessica to call back.    RE: Review the phone note below with PCP. Verbal orders given to increase the metroprolol to 50 mg BID and the Lasix to 80 mg BID as originally ordered.

## 2014-07-08 NOTE — Patient Instructions (Signed)
Will check on your machine and make sure it is working properly Will get a download off your machine and make sure your sleep apnea is being adequately controlled.  Please call if you do not hear from [place] after your download is done.  Work on weight loss followup with me again in one year, but will be in touch about your download

## 2014-07-09 ENCOUNTER — Telehealth: Payer: Self-pay | Admitting: Internal Medicine

## 2014-07-09 ENCOUNTER — Other Ambulatory Visit: Payer: Self-pay

## 2014-07-09 NOTE — Telephone Encounter (Signed)
[facility] number [phone].    Spoke to Elma and gave her verbal MD order (look at previous phone note).   LVM for Kristen to call back. Dr. Yetta Barre advise is that pt needs to be seen.

## 2014-07-09 NOTE — Telephone Encounter (Signed)
Spoke to [place] with [place]. Gave her PCP verbal med instructions.

## 2014-07-09 NOTE — Telephone Encounter (Signed)
Spoke to pt. He stated that he is feeling better. Asked if he felt he needed to be seen right away. He stated that [facility] nurse was more worried about the low O2 than he was. He did say that if he felt that it was an emergency he would call us or other emergency services.

## 2014-07-09 NOTE — Telephone Encounter (Signed)
Nurse is at patients house.  She states he is in yellow zone.  He has lower oxygen levels, 5 lb weight gain, pain and swelling in abdomin.  Nurse would like a call back before she leaves the patients house.  She will be there the next few minutes.

## 2014-07-10 ENCOUNTER — Emergency Department (HOSPITAL_COMMUNITY): Payer: Medicare Other

## 2014-07-10 ENCOUNTER — Emergency Department (HOSPITAL_COMMUNITY)
Admission: EM | Admit: 2014-07-10 | Discharge: 2014-07-10 | Disposition: A | Discharge status: Home / Self Care | Payer: Medicare Other | Attending: Emergency Medicine | Admitting: Emergency Medicine

## 2014-07-10 ENCOUNTER — Encounter (HOSPITAL_COMMUNITY): Payer: Self-pay | Admitting: Emergency Medicine

## 2014-07-10 DIAGNOSIS — R0600 Dyspnea, unspecified: Secondary | ICD-10-CM

## 2014-07-10 DIAGNOSIS — R109 Unspecified abdominal pain: Secondary | ICD-10-CM | POA: Diagnosis present

## 2014-07-10 DIAGNOSIS — F319 Bipolar disorder, unspecified: Secondary | ICD-10-CM | POA: Diagnosis not present

## 2014-07-10 DIAGNOSIS — I10 Essential (primary) hypertension: Secondary | ICD-10-CM | POA: Insufficient documentation

## 2014-07-10 DIAGNOSIS — Z9981 Dependence on supplemental oxygen: Secondary | ICD-10-CM | POA: Diagnosis not present

## 2014-07-10 DIAGNOSIS — G4733 Obstructive sleep apnea (adult) (pediatric): Secondary | ICD-10-CM | POA: Insufficient documentation

## 2014-07-10 DIAGNOSIS — E669 Obesity, unspecified: Secondary | ICD-10-CM | POA: Insufficient documentation

## 2014-07-10 DIAGNOSIS — I252 Old myocardial infarction: Secondary | ICD-10-CM | POA: Insufficient documentation

## 2014-07-10 DIAGNOSIS — Z9889 Other specified postprocedural states: Secondary | ICD-10-CM | POA: Insufficient documentation

## 2014-07-10 DIAGNOSIS — Z7902 Long term (current) use of antithrombotics/antiplatelets: Secondary | ICD-10-CM | POA: Diagnosis not present

## 2014-07-10 DIAGNOSIS — N390 Urinary tract infection, site not specified: Secondary | ICD-10-CM | POA: Insufficient documentation

## 2014-07-10 DIAGNOSIS — H269 Unspecified cataract: Secondary | ICD-10-CM | POA: Insufficient documentation

## 2014-07-10 DIAGNOSIS — F419 Anxiety disorder, unspecified: Secondary | ICD-10-CM | POA: Insufficient documentation

## 2014-07-10 DIAGNOSIS — Z87438 Personal history of other diseases of male genital organs: Secondary | ICD-10-CM | POA: Diagnosis not present

## 2014-07-10 DIAGNOSIS — M1990 Unspecified osteoarthritis, unspecified site: Secondary | ICD-10-CM | POA: Diagnosis not present

## 2014-07-10 DIAGNOSIS — I2510 Atherosclerotic heart disease of native coronary artery without angina pectoris: Secondary | ICD-10-CM | POA: Diagnosis not present

## 2014-07-10 DIAGNOSIS — M109 Gout, unspecified: Secondary | ICD-10-CM | POA: Diagnosis not present

## 2014-07-10 DIAGNOSIS — K219 Gastro-esophageal reflux disease without esophagitis: Secondary | ICD-10-CM | POA: Insufficient documentation

## 2014-07-10 DIAGNOSIS — Z7982 Long term (current) use of aspirin: Secondary | ICD-10-CM | POA: Insufficient documentation

## 2014-07-10 DIAGNOSIS — Z79899 Other long term (current) drug therapy: Secondary | ICD-10-CM | POA: Insufficient documentation

## 2014-07-10 DIAGNOSIS — Z8673 Personal history of transient ischemic attack (TIA), and cerebral infarction without residual deficits: Secondary | ICD-10-CM | POA: Diagnosis not present

## 2014-07-10 LAB — CBC WITH DIFFERENTIAL/PLATELET
BASOS ABS: 0 10*3/uL (ref 0.0–0.1)
BASOS PCT: 1 % (ref 0–1)
Eosinophils Absolute: 0.3 10*3/uL (ref 0.0–0.7)
Eosinophils Relative: 5 % (ref 0–5)
HEMATOCRIT: 47.7 % (ref 39.0–52.0)
HEMOGLOBIN: 15.3 g/dL (ref 13.0–17.0)
Lymphocytes Relative: 25 % (ref 12–46)
Lymphs Abs: 1.4 10*3/uL (ref 0.7–4.0)
MCH: 30.2 pg (ref 26.0–34.0)
MCHC: 32.1 g/dL (ref 30.0–36.0)
MCV: 94.1 fL (ref 78.0–100.0)
Monocytes Absolute: 0.8 10*3/uL (ref 0.1–1.0)
Monocytes Relative: 13 % — ABNORMAL HIGH (ref 3–12)
NEUTROS PCT: 56 % (ref 43–77)
Neutro Abs: 3.2 10*3/uL (ref 1.7–7.7)
Platelets: 112 10*3/uL — ABNORMAL LOW (ref 150–400)
RBC: 5.07 MIL/uL (ref 4.22–5.81)
RDW: 16.3 % — ABNORMAL HIGH (ref 11.5–15.5)
WBC: 5.7 10*3/uL (ref 4.0–10.5)

## 2014-07-10 LAB — COMPREHENSIVE METABOLIC PANEL
ALBUMIN: 3.8 g/dL (ref 3.5–5.2)
ALT: 18 U/L (ref 0–53)
AST: 27 U/L (ref 0–37)
Alkaline Phosphatase: 114 U/L (ref 39–117)
Anion gap: 10 (ref 5–15)
BUN: 31 mg/dL — ABNORMAL HIGH (ref 6–23)
CALCIUM: 9.3 mg/dL (ref 8.4–10.5)
CHLORIDE: 102 meq/L (ref 96–112)
CO2: 32 mEq/L (ref 19–32)
Creatinine, Ser: 1.28 mg/dL (ref 0.50–1.35)
GFR calc Af Amer: 60 mL/min — ABNORMAL LOW (ref >90)
GFR, EST NON AFRICAN AMERICAN: 52 mL/min — AB (ref >90)
Glucose, Bld: 102 mg/dL — ABNORMAL HIGH (ref 70–99)
Potassium: 5 mEq/L (ref 3.7–5.3)
SODIUM: 144 meq/L (ref 137–147)
Total Bilirubin: 0.4 mg/dL (ref 0.3–1.2)
Total Protein: 8.1 g/dL (ref 6.0–8.3)

## 2014-07-10 LAB — URINE MICROSCOPIC-ADD ON

## 2014-07-10 LAB — URINALYSIS, ROUTINE W REFLEX MICROSCOPIC
Bilirubin Urine: NEGATIVE
Glucose, UA: NEGATIVE mg/dL
HGB URINE DIPSTICK: NEGATIVE
Ketones, ur: NEGATIVE mg/dL
Nitrite: NEGATIVE
PROTEIN: NEGATIVE mg/dL
SPECIFIC GRAVITY, URINE: 1.018 (ref 1.005–1.030)
UROBILINOGEN UA: 0.2 mg/dL (ref 0.0–1.0)
pH: 6.5 (ref 5.0–8.0)

## 2014-07-10 LAB — I-STAT TROPONIN, ED: TROPONIN I, POC: 0.02 ng/mL (ref 0.00–0.08)

## 2014-07-10 LAB — PRO B NATRIURETIC PEPTIDE: Pro B Natriuretic peptide (BNP): 422.7 pg/mL (ref 0–450)

## 2014-07-10 LAB — LIPASE, BLOOD: Lipase: 45 U/L (ref 11–59)

## 2014-07-10 MED ORDER — SULFAMETHOXAZOLE-TRIMETHOPRIM 800-160 MG PO TABS: 1.0000 | ORAL_TABLET | Freq: Two times a day (BID) | ORAL | Status: DC

## 2014-07-10 NOTE — ED Notes (Signed)
Per EMS: Pt from home for eval of abd distention and pain x1 day. Pt denies any n/v/d or fevers at home, reports pain feels like pressure in abdomen that radiates up into chest and pt reports sob. NAD. Pt states sob is relieved with o2, EMS reports unremarkable EKG.

## 2014-07-10 NOTE — ED Notes (Signed)
Pt tried to give urine specimen and urinated on the bed. Sheets changed.

## 2014-07-10 NOTE — ED Notes (Signed)
MD at bedside. 

## 2014-07-10 NOTE — Discharge Instructions (Signed)

## 2014-07-10 NOTE — ED Provider Notes (Signed)
CSN: [id number]     Arrival date & time 07/10/14  0730 History   First MD Initiated Contact with Patient 07/10/14 [phone]     Chief Complaint  Patient presents with  . Abdominal Pain  . Shortness of Breath     (Consider location/radiation/quality/duration/timing/severity/associated sxs/prior Treatment) Patient is a 80 y.o. male presenting with abdominal pain and shortness of breath. The history is provided by the patient.  Abdominal Pain Associated symptoms: cough and shortness of breath   Associated symptoms: no chest pain, no diarrhea, no nausea and no vomiting   Shortness of Breath Associated symptoms: abdominal pain and cough   Associated symptoms: no chest pain, no headaches, no rash and no vomiting    patient presents with shortness of breath and some abdominal pain over the last few days. States his saturations were 82% home. He is not on oxygen home but does have pulse ox. He is an occasional cough that is unchanged. No chest pain. He states he has some worsening swelling in his feet. He also states he has abdominal pain in his abdomen has been getting larger. No fevers. No diarrhea or constipation. States he still passing gas. The abdominal pain is described as dull and is constant.  Past Medical History  Diagnosis Date  . Gout, unspecified     severe dz, "treatment done that last 15 years"  . OBESITY   . RESTLESS LEG SYNDROME   . DETACHMENT, RETINAL NOS 1994  . CATARACT NOS 2008  . HYPERTENSION   . CORONARY ARTERY DISEASE   . PERICARDITIS, ACUTE NEC 2007    MSSA, s/p pericardial window  . GERD   . DEGENERATIVE JOINT DISEASE   . CAROTID ENDARTERECTOMY, LEFT, HX OF   . OBSTRUCTIVE SLEEP APNEA     CPAP qhs  . TIA (transient ischemic attack) 2005  . BPH (benign prostatic hypertrophy)     "minor"  . Lumbar spinal stenosis   . Carotid artery occlusion   . Unspecified venous (peripheral) insufficiency   . Stroke May 2003  . MYOCARDIAL INFARCTION 2004, 03/2010    "minor"   . Anxiety   . Depression   . Shortness of breath    Past Surgical History  Procedure Laterality Date  . Pericardial window  2007  . Left arm  2008    shoulder  . Right arm  1970's    shoulder  . Cataract extraction      Left side x's 2 and right  . Retinal detachment surgery      left side  . Carotid endarterectomy Left 12-06-05    cea  . Coronary angioplasty  01/2009    x 1 stent  . Colonoscopy    . Carpal tunnel release Bilateral   . Back surgery  2013    removed bone spurs  . Inguinal hernia repair Left 10/01/2013    Procedure: HERNIA REPAIR INGUINAL ADULT;  Surgeon: Axel FillerArmando Ramirez, MD;  Location: [facility];  Service: General;  Laterality: Left;  . Insertion of mesh Left 10/01/2013    Procedure: INSERTION OF MESH;  Surgeon: Axel FillerArmando Ramirez, MD;  Location: [facility];  Service: General;  Laterality: Left;  . Hernia repair    . Finger amputation  2015    JUST TO FIRST JOINT RIGHT HAND  LAST FINGER   Family History  Problem Relation Age of Onset  . Heart disease Mother   . Diabetes Father   . Heart disease Father   . Breast cancer Sister   .  Cancer Sister   . Prostate cancer Brother   . Lung cancer Brother   . Cancer Brother   . Diabetes Daughter   . Diabetes Son    History  Substance Use Topics  . Smoking status: Never Smoker   . Smokeless tobacco: Never Used  . Alcohol Use: No    Review of Systems  Constitutional: Negative for activity change and appetite change.  Eyes: Negative for pain.  Respiratory: Positive for cough and shortness of breath. Negative for chest tightness.   Cardiovascular: Positive for leg swelling. Negative for chest pain.  Gastrointestinal: Positive for abdominal pain. Negative for nausea, vomiting and diarrhea.  Genitourinary: Negative for flank pain.  Musculoskeletal: Positive for back pain. Negative for neck stiffness.  Skin: Negative for rash.  Neurological: Negative for weakness, numbness and headaches.  Psychiatric/Behavioral:  Negative for behavioral problems.      Allergies  Atorvastatin  Home Medications   Prior to Admission medications   Medication Sig Start Date End Date Taking? Authorizing Provider  allopurinol (ZYLOPRIM) 300 MG tablet Take 300 mg by mouth every morning. 03/03/14  Yes Historical Provider, MD  aspirin EC 81 MG tablet Take 81 mg by mouth daily.   Yes Historical Provider, MD  bisacodyl (DULCOLAX) 10 MG suppository Place 1 suppository (10 mg total) rectally daily as needed for moderate constipation. 06/05/14  Yes Joseph Art, DO  buPROPion (WELLBUTRIN XL) 150 MG 24 hr tablet Take 1 tablet (150 mg total) by mouth every morning. 10/08/13  Yes Newt Lukes, MD  cholecalciferol (VITAMIN D) 1000 UNITS tablet Take 1,000 Units by mouth daily.   Yes Historical Provider, MD  clopidogrel (PLAVIX) 75 MG tablet Take 1 tablet (75 mg total) by mouth daily. 10/08/13  Yes Newt Lukes, MD  Ferrous Gluconate 256 (28 FE) MG TABS Take 256 mg by mouth daily.   Yes Historical Provider, MD  furosemide (LASIX) 40 MG tablet Take 1 tablet (40 mg total) by mouth 2 (two) times daily. 06/05/14  Yes Jessica U Vann, DO  LORazepam (ATIVAN) 1 MG tablet Take 1 tablet (1 mg total) by mouth every morning. 06/05/14  Yes Jessica U Vann, DO  LYRICA 200 MG capsule Take 200 mg by mouth 2 (two) times daily. 03/05/14  Yes Historical Provider, MD  Magnesium 250 MG TABS Take 250 mg by mouth daily.    Yes Historical Provider, MD  metoprolol tartrate (LOPRESSOR) 25 MG tablet Take 1 tablet (25 mg total) by mouth 2 (two) times daily. Patient taking differently: Take 12.5 mg by mouth 2 (two) times daily.  06/05/14  Yes Joseph Art, DO  mirabegron ER (MYRBETRIQ) 50 MG TB24 tablet Take 50 mg by mouth daily.   Yes Historical Provider, MD  mirtazapine (REMERON) 15 MG tablet Take 1 tablet (15 mg total) by mouth at bedtime. 10/08/13  Yes Newt Lukes, MD  Omega-3 Fatty Acids (FISH OIL) 1000 MG CAPS Take 1,000 mg by mouth daily.    Yes Historical Provider, MD  ondansetron (ZOFRAN) 8 MG tablet Take 1 tablet (8 mg total) by mouth every 8 (eight) hours as needed for nausea or vomiting. 06/05/14  Yes Joseph Art, DO  oxyCODONE-acetaminophen (PERCOCET) 10-325 MG per tablet Take 1 tablet by mouth every 8 (eight) hours as needed for pain. 06/05/14  Yes Jessica U Vann, DO  pantoprazole (PROTONIX) 40 MG tablet Take 1 tablet (40 mg total) by mouth 2 (two) times daily. 02/10/14  Yes Zannie Cove, MD  rOPINIRole (REQUIP)  4 MG tablet Take 4 mg by mouth at bedtime.   Yes Historical Provider, MD  tamsulosin (FLOMAX) 0.4 MG CAPS capsule Take 1 capsule (0.4 mg total) by mouth daily. 10/08/13  Yes Newt Lukes, MD  sulfamethoxazole-trimethoprim (BACTRIM DS,SEPTRA DS) 800-160 MG per tablet Take 1 tablet by mouth 2 (two) times daily. 07/10/14   Juliet Rude. Waylyn Tenbrink, MD   BP 149/66 mmHg  Pulse 61  Temp(Src) 98 F (36.7 C) (Oral)  Resp 20  Ht 5\' 9"  (1.753 m)  Wt 250 lb (113.399 kg)  BMI 36.90 kg/m2  SpO2 97% Physical Exam  Constitutional: He is oriented to person, place, and time. He appears well-developed and well-nourished.  Patient is obese  HENT:  Head: Normocephalic and atraumatic.  Eyes: EOM are normal. Pupils are equal, round, and reactive to light.  Neck: Normal range of motion. Neck supple.  Cardiovascular: Normal rate, regular rhythm and normal heart sounds.   No murmur heard. Pulmonary/Chest: Effort normal and breath sounds normal.  Abdominal: He exhibits distension. He exhibits no mass. There is no tenderness. There is no rebound and no guarding.  Musculoskeletal: Normal range of motion. He exhibits edema.  Bilateral lower extremity pitting edema with some weeping.  Neurological: He is alert and oriented to person, place, and time. No cranial nerve deficit.  Skin: Skin is warm and dry.  Psychiatric: He has a normal mood and affect.  Nursing note and vitals reviewed.   ED Course  Procedures (including critical  care time) Labs Review Labs Reviewed  CBC WITH DIFFERENTIAL - Abnormal; Notable for the following:    RDW 16.3 (*)    Platelets 112 (*)    Monocytes Relative 13 (*)    All other components within normal limits  COMPREHENSIVE METABOLIC PANEL - Abnormal; Notable for the following:    Glucose, Bld 102 (*)    BUN 31 (*)    GFR calc non Af Amer 52 (*)    GFR calc Af Amer 60 (*)    All other components within normal limits  URINALYSIS, ROUTINE W REFLEX MICROSCOPIC - Abnormal; Notable for the following:    Leukocytes, UA SMALL (*)    All other components within normal limits  URINE MICROSCOPIC-ADD ON - Abnormal; Notable for the following:    Bacteria, UA FEW (*)    All other components within normal limits  URINE CULTURE  LIPASE, BLOOD  PRO B NATRIURETIC PEPTIDE  I-STAT TROPOININ, ED    Imaging Review No results found.   EKG Interpretation   Date/Time:  Friday July 10 2014 07:37:41 EST Ventricular Rate:  57 PR Interval:  203 QRS Duration: 116 QT Interval:  436 QTC Calculation: 424 R Axis:   -21 Text Interpretation:  Sinus rhythm Incomplete left bundle branch block  Confirmed by Rubin Payor  MD, Harrold Donath [phone]) on 07/10/2014 8:34:16 AM Also  confirmed by Rubin Payor  MD, Harrold Donath [phone])  on 07/10/2014 9:38:57 AM      MDM   Final diagnoses:  Abdominal pain  Lower urinary tract infection  Dyspnea    Fatigue with some SOB. Chronic respiratory problems. Abdominal distention and pain. Somewhat improved after flatulence. UTI on labs. Not hypoxic with ambulation. Will d/c with ABX    Harrold Donath R. Rubin Payor, MD 07/12/14 [phone]

## 2014-07-11 LAB — URINE CULTURE: Colony Count: 35000

## 2014-07-13 ENCOUNTER — Emergency Department (HOSPITAL_COMMUNITY): Payer: Medicare Other

## 2014-07-13 ENCOUNTER — Telehealth: Payer: Self-pay | Admitting: Internal Medicine

## 2014-07-13 ENCOUNTER — Inpatient Hospital Stay (HOSPITAL_COMMUNITY)
Admission: EM | Admit: 2014-07-13 | Discharge: 2014-07-16 | DRG: 291 | Disposition: A | Discharge status: Skilled Nursing Facility | Payer: Medicare Other | Attending: Internal Medicine | Admitting: Internal Medicine

## 2014-07-13 ENCOUNTER — Encounter (HOSPITAL_COMMUNITY): Payer: Self-pay | Admitting: Physical Medicine and Rehabilitation

## 2014-07-13 ENCOUNTER — Telehealth: Payer: Self-pay | Admitting: *Deleted

## 2014-07-13 DIAGNOSIS — N183 Chronic kidney disease, stage 3 unspecified: Secondary | ICD-10-CM | POA: Diagnosis present

## 2014-07-13 DIAGNOSIS — R410 Disorientation, unspecified: Secondary | ICD-10-CM

## 2014-07-13 DIAGNOSIS — E785 Hyperlipidemia, unspecified: Secondary | ICD-10-CM | POA: Diagnosis present

## 2014-07-13 DIAGNOSIS — F419 Anxiety disorder, unspecified: Secondary | ICD-10-CM | POA: Diagnosis present

## 2014-07-13 DIAGNOSIS — E662 Morbid (severe) obesity with alveolar hypoventilation: Secondary | ICD-10-CM | POA: Diagnosis present

## 2014-07-13 DIAGNOSIS — E669 Obesity, unspecified: Secondary | ICD-10-CM | POA: Diagnosis present

## 2014-07-13 DIAGNOSIS — G4733 Obstructive sleep apnea (adult) (pediatric): Secondary | ICD-10-CM | POA: Diagnosis present

## 2014-07-13 DIAGNOSIS — G629 Polyneuropathy, unspecified: Secondary | ICD-10-CM | POA: Diagnosis present

## 2014-07-13 DIAGNOSIS — R0602 Shortness of breath: Secondary | ICD-10-CM

## 2014-07-13 DIAGNOSIS — J9622 Acute and chronic respiratory failure with hypercapnia: Secondary | ICD-10-CM | POA: Diagnosis present

## 2014-07-13 DIAGNOSIS — J9621 Acute and chronic respiratory failure with hypoxia: Secondary | ICD-10-CM | POA: Diagnosis present

## 2014-07-13 DIAGNOSIS — Z801 Family history of malignant neoplasm of trachea, bronchus and lung: Secondary | ICD-10-CM

## 2014-07-13 DIAGNOSIS — Z833 Family history of diabetes mellitus: Secondary | ICD-10-CM

## 2014-07-13 DIAGNOSIS — J189 Pneumonia, unspecified organism: Secondary | ICD-10-CM | POA: Diagnosis present

## 2014-07-13 DIAGNOSIS — K5900 Constipation, unspecified: Secondary | ICD-10-CM | POA: Diagnosis present

## 2014-07-13 DIAGNOSIS — E1159 Type 2 diabetes mellitus with other circulatory complications: Secondary | ICD-10-CM | POA: Diagnosis present

## 2014-07-13 DIAGNOSIS — G9349 Other encephalopathy: Secondary | ICD-10-CM | POA: Diagnosis present

## 2014-07-13 DIAGNOSIS — J9602 Acute respiratory failure with hypercapnia: Secondary | ICD-10-CM

## 2014-07-13 DIAGNOSIS — G2581 Restless legs syndrome: Secondary | ICD-10-CM | POA: Diagnosis present

## 2014-07-13 DIAGNOSIS — I2510 Atherosclerotic heart disease of native coronary artery without angina pectoris: Secondary | ICD-10-CM | POA: Diagnosis present

## 2014-07-13 DIAGNOSIS — E119 Type 2 diabetes mellitus without complications: Secondary | ICD-10-CM | POA: Diagnosis present

## 2014-07-13 DIAGNOSIS — K567 Ileus, unspecified: Secondary | ICD-10-CM | POA: Insufficient documentation

## 2014-07-13 DIAGNOSIS — Z8042 Family history of malignant neoplasm of prostate: Secondary | ICD-10-CM

## 2014-07-13 DIAGNOSIS — E8729 Other acidosis: Secondary | ICD-10-CM | POA: Insufficient documentation

## 2014-07-13 DIAGNOSIS — I10 Essential (primary) hypertension: Secondary | ICD-10-CM | POA: Diagnosis present

## 2014-07-13 DIAGNOSIS — N400 Enlarged prostate without lower urinary tract symptoms: Secondary | ICD-10-CM | POA: Diagnosis present

## 2014-07-13 DIAGNOSIS — Z79899 Other long term (current) drug therapy: Secondary | ICD-10-CM

## 2014-07-13 DIAGNOSIS — Z9861 Coronary angioplasty status: Secondary | ICD-10-CM

## 2014-07-13 DIAGNOSIS — I872 Venous insufficiency (chronic) (peripheral): Secondary | ICD-10-CM | POA: Diagnosis present

## 2014-07-13 DIAGNOSIS — F329 Major depressive disorder, single episode, unspecified: Secondary | ICD-10-CM | POA: Diagnosis present

## 2014-07-13 DIAGNOSIS — Z803 Family history of malignant neoplasm of breast: Secondary | ICD-10-CM

## 2014-07-13 DIAGNOSIS — Z8673 Personal history of transient ischemic attack (TIA), and cerebral infarction without residual deficits: Secondary | ICD-10-CM

## 2014-07-13 DIAGNOSIS — D696 Thrombocytopenia, unspecified: Secondary | ICD-10-CM | POA: Diagnosis present

## 2014-07-13 DIAGNOSIS — K219 Gastro-esophageal reflux disease without esophagitis: Secondary | ICD-10-CM | POA: Diagnosis present

## 2014-07-13 DIAGNOSIS — M109 Gout, unspecified: Secondary | ICD-10-CM | POA: Diagnosis present

## 2014-07-13 DIAGNOSIS — I5033 Acute on chronic diastolic (congestive) heart failure: Secondary | ICD-10-CM | POA: Diagnosis not present

## 2014-07-13 DIAGNOSIS — E872 Acidosis: Secondary | ICD-10-CM | POA: Diagnosis present

## 2014-07-13 DIAGNOSIS — R109 Unspecified abdominal pain: Secondary | ICD-10-CM

## 2014-07-13 DIAGNOSIS — I252 Old myocardial infarction: Secondary | ICD-10-CM

## 2014-07-13 DIAGNOSIS — Z9114 Patient's other noncompliance with medication regimen: Secondary | ICD-10-CM | POA: Diagnosis present

## 2014-07-13 DIAGNOSIS — R140 Abdominal distension (gaseous): Secondary | ICD-10-CM

## 2014-07-13 DIAGNOSIS — N179 Acute kidney failure, unspecified: Secondary | ICD-10-CM | POA: Diagnosis present

## 2014-07-13 DIAGNOSIS — R001 Bradycardia, unspecified: Secondary | ICD-10-CM | POA: Diagnosis present

## 2014-07-13 DIAGNOSIS — J9601 Acute respiratory failure with hypoxia: Secondary | ICD-10-CM | POA: Diagnosis present

## 2014-07-13 DIAGNOSIS — I350 Nonrheumatic aortic (valve) stenosis: Secondary | ICD-10-CM | POA: Diagnosis present

## 2014-07-13 DIAGNOSIS — Z8249 Family history of ischemic heart disease and other diseases of the circulatory system: Secondary | ICD-10-CM

## 2014-07-13 DIAGNOSIS — T50905A Adverse effect of unspecified drugs, medicaments and biological substances, initial encounter: Secondary | ICD-10-CM

## 2014-07-13 DIAGNOSIS — Z7982 Long term (current) use of aspirin: Secondary | ICD-10-CM

## 2014-07-13 DIAGNOSIS — I359 Nonrheumatic aortic valve disorder, unspecified: Secondary | ICD-10-CM | POA: Diagnosis present

## 2014-07-13 DIAGNOSIS — L03116 Cellulitis of left lower limb: Secondary | ICD-10-CM | POA: Diagnosis present

## 2014-07-13 DIAGNOSIS — Z7902 Long term (current) use of antithrombotics/antiplatelets: Secondary | ICD-10-CM

## 2014-07-13 DIAGNOSIS — Z9119 Patient's noncompliance with other medical treatment and regimen: Secondary | ICD-10-CM | POA: Diagnosis present

## 2014-07-13 DIAGNOSIS — Z6837 Body mass index (BMI) 37.0-37.9, adult: Secondary | ICD-10-CM

## 2014-07-13 DIAGNOSIS — L03115 Cellulitis of right lower limb: Secondary | ICD-10-CM | POA: Diagnosis present

## 2014-07-13 HISTORY — DX: Obesity, unspecified: E66.9

## 2014-07-13 HISTORY — DX: Hypertensive heart disease without heart failure: I11.9

## 2014-07-13 HISTORY — DX: Restless legs syndrome: G25.81

## 2014-07-13 HISTORY — DX: Atherosclerotic heart disease of native coronary artery without angina pectoris: I25.10

## 2014-07-13 HISTORY — DX: Personal history of other diseases of the circulatory system: Z86.79

## 2014-07-13 LAB — COMPREHENSIVE METABOLIC PANEL
ALK PHOS: 111 U/L (ref 39–117)
ALT: 25 U/L (ref 0–53)
AST: 24 U/L (ref 0–37)
Albumin: 3.6 g/dL (ref 3.5–5.2)
Anion gap: 10 (ref 5–15)
BUN: 35 mg/dL — ABNORMAL HIGH (ref 6–23)
CO2: 32 mEq/L (ref 19–32)
Calcium: 8.8 mg/dL (ref 8.4–10.5)
Chloride: 101 mEq/L (ref 96–112)
Creatinine, Ser: 1.78 mg/dL — ABNORMAL HIGH (ref 0.50–1.35)
GFR calc Af Amer: 40 mL/min — ABNORMAL LOW (ref >90)
GFR calc non Af Amer: 35 mL/min — ABNORMAL LOW (ref >90)
Glucose, Bld: 106 mg/dL — ABNORMAL HIGH (ref 70–99)
POTASSIUM: 4.9 meq/L (ref 3.7–5.3)
SODIUM: 143 meq/L (ref 137–147)
TOTAL PROTEIN: 7.5 g/dL (ref 6.0–8.3)
Total Bilirubin: 0.3 mg/dL (ref 0.3–1.2)

## 2014-07-13 LAB — I-STAT ARTERIAL BLOOD GAS, ED
Acid-Base Excess: 3 mmol/L — ABNORMAL HIGH (ref 0.0–2.0)
BICARBONATE: 30 meq/L — AB (ref 20.0–24.0)
O2 Saturation: 91 %
PH ART: 7.339 — AB (ref 7.350–7.450)
PO2 ART: 67 mmHg — AB (ref 80.0–100.0)
Patient temperature: 98.4
TCO2: 32 mmol/L (ref 0–100)
pCO2 arterial: 55.6 mmHg — ABNORMAL HIGH (ref 35.0–45.0)

## 2014-07-13 LAB — CBC WITH DIFFERENTIAL/PLATELET
Basophils Absolute: 0 10*3/uL (ref 0.0–0.1)
Basophils Relative: 0 % (ref 0–1)
EOS ABS: 0.3 10*3/uL (ref 0.0–0.7)
Eosinophils Relative: 5 % (ref 0–5)
HCT: 43.7 % (ref 39.0–52.0)
Hemoglobin: 13.5 g/dL (ref 13.0–17.0)
LYMPHS PCT: 25 % (ref 12–46)
Lymphs Abs: 1.3 10*3/uL (ref 0.7–4.0)
MCH: 28.8 pg (ref 26.0–34.0)
MCHC: 30.9 g/dL (ref 30.0–36.0)
MCV: 93.4 fL (ref 78.0–100.0)
Monocytes Absolute: 0.6 10*3/uL (ref 0.1–1.0)
Monocytes Relative: 11 % (ref 3–12)
Neutro Abs: 3.1 10*3/uL (ref 1.7–7.7)
Neutrophils Relative %: 59 % (ref 43–77)
PLATELETS: 109 10*3/uL — AB (ref 150–400)
RBC: 4.68 MIL/uL (ref 4.22–5.81)
RDW: 16.3 % — ABNORMAL HIGH (ref 11.5–15.5)
WBC: 5.3 10*3/uL (ref 4.0–10.5)

## 2014-07-13 LAB — URINALYSIS, ROUTINE W REFLEX MICROSCOPIC
Bilirubin Urine: NEGATIVE
GLUCOSE, UA: NEGATIVE mg/dL
Hgb urine dipstick: NEGATIVE
KETONES UR: NEGATIVE mg/dL
LEUKOCYTES UA: NEGATIVE
Nitrite: NEGATIVE
Protein, ur: NEGATIVE mg/dL
Specific Gravity, Urine: 1.015 (ref 1.005–1.030)
Urobilinogen, UA: 1 mg/dL (ref 0.0–1.0)
pH: 5.5 (ref 5.0–8.0)

## 2014-07-13 LAB — TROPONIN I: Troponin I: <0.3 ng/mL (ref <0.30)

## 2014-07-13 LAB — PRO B NATRIURETIC PEPTIDE: Pro B Natriuretic peptide (BNP): 814.6 pg/mL — ABNORMAL HIGH (ref 0–450)

## 2014-07-13 MED ORDER — ALBUTEROL SULFATE (2.5 MG/3ML) 0.083% IN NEBU
5.0000 mg | INHALATION_SOLUTION | Freq: Once | RESPIRATORY_TRACT | Status: AC
  Administered 2014-07-13: 5 mg via RESPIRATORY_TRACT
  Filled 2014-07-13: qty 6

## 2014-07-13 NOTE — ED Provider Notes (Signed)
CSN: [id number]     Arrival date & time 07/13/14  1830 History   First MD Initiated Contact with Patient 07/13/14 1836     Chief Complaint  Patient presents with  . Shortness of Breath     (Consider location/radiation/quality/duration/timing/severity/associated sxs/prior Treatment) The history is provided by the patient. No language interpreter was used.  [name] is a 80 year old male with past medical history of gout, obesity, restless leg syndrome, hypertension, coronary artery disease, pericarditis with pericardial window placed in 2007, CEA in 2007, lumbar stenosis, GERD, MI with stent placement in 2010 presenting to the ED with shortness of breath has been ongoing since Friday. As per family who accompanies patient, reported the patient is been having shortness of breath continuously worse with exertion. Reported that patient has been somewhat disoriented-reported that he has been talking irregularly and is not his normal self-reported the mentation is mildly different. Stated that patient was seen on 07/10/2014 regarding shortness of breath and abdominal pain-patient was diagnosed as being constipated was given suppositories. Stated the suppositories were minister yesterday with a small amount of bowel movements and gas-reported that after this episode patient felt somewhat better. Patient reported that he is not on any oxygen therapy. Denied chest pain, travel, cough, nasal congestion, fainting, abdominal pain, nausea, vomiting, diarrhea, melena, hematochezia, urinary issues. PCP Dr. Felicity Coyer  Past Medical History  Diagnosis Date  . Gout, unspecified     severe dz, "treatment done that last 15 years"  . OBESITY   . RESTLESS LEG SYNDROME   . DETACHMENT, RETINAL NOS 1994  . CATARACT NOS 2008  . HYPERTENSION   . CORONARY ARTERY DISEASE   . PERICARDITIS, ACUTE NEC 2007    MSSA, s/p pericardial window  . GERD   . DEGENERATIVE JOINT DISEASE   . CAROTID ENDARTERECTOMY, LEFT, HX  OF   . OBSTRUCTIVE SLEEP APNEA     CPAP qhs  . TIA (transient ischemic attack) 2005  . BPH (benign prostatic hypertrophy)     "minor"  . Lumbar spinal stenosis   . Carotid artery occlusion   . Unspecified venous (peripheral) insufficiency   . Stroke May 2003  . MYOCARDIAL INFARCTION 2004, 03/2010    "minor"  . Anxiety   . Depression   . Shortness of breath    Past Surgical History  Procedure Laterality Date  . Pericardial window  2007  . Left arm  2008    shoulder  . Right arm  1970's    shoulder  . Cataract extraction      Left side x's 2 and right  . Retinal detachment surgery      left side  . Carotid endarterectomy Left 12-06-05    cea  . Coronary angioplasty  01/2009    x 1 stent  . Colonoscopy    . Carpal tunnel release Bilateral   . Back surgery  2013    removed bone spurs  . Inguinal hernia repair Left 10/01/2013    Procedure: HERNIA REPAIR INGUINAL ADULT;  Surgeon: Axel Filler, MD;  Location: [facility];  Service: General;  Laterality: Left;  . Insertion of mesh Left 10/01/2013    Procedure: INSERTION OF MESH;  Surgeon: Axel Filler, MD;  Location: [facility];  Service: General;  Laterality: Left;  . Hernia repair    . Finger amputation  2015    JUST TO FIRST JOINT RIGHT HAND  LAST FINGER   Family History  Problem Relation Age of Onset  . Heart disease Mother   .  Diabetes Father   . Heart disease Father   . Breast cancer Sister   . Cancer Sister   . Prostate cancer Brother   . Lung cancer Brother   . Cancer Brother   . Diabetes Daughter   . Diabetes Son    History  Substance Use Topics  . Smoking status: Never Smoker   . Smokeless tobacco: Never Used  . Alcohol Use: No    Review of Systems  Constitutional: Negative for fever and chills.  Respiratory: Positive for chest tightness, shortness of breath and wheezing.   Cardiovascular: Positive for leg swelling. Negative for chest pain.  Gastrointestinal: Positive for constipation. Negative for nausea,  vomiting, abdominal pain and diarrhea.  Musculoskeletal: Negative for back pain and neck pain.  Neurological: Positive for dizziness. Negative for weakness, numbness and headaches.      Allergies  Statins  Home Medications   Prior to Admission medications   Medication Sig Start Date End Date Taking? Authorizing Provider  allopurinol (ZYLOPRIM) 300 MG tablet Take 300 mg by mouth every morning. 03/03/14  Yes Historical Provider, MD  aspirin EC 81 MG tablet Take 81 mg by mouth daily.   Yes Historical Provider, MD  bisacodyl (DULCOLAX) 10 MG suppository Place 1 suppository (10 mg total) rectally daily as needed for moderate constipation. 06/05/14  Yes Joseph ArtJessica U Vann, DO  buPROPion (WELLBUTRIN XL) 150 MG 24 hr tablet Take 1 tablet (150 mg total) by mouth every morning. 10/08/13  Yes Newt LukesValerie A Leschber, MD  cholecalciferol (VITAMIN D) 1000 UNITS tablet Take 1,000 Units by mouth daily.   Yes Historical Provider, MD  clopidogrel (PLAVIX) 75 MG tablet Take 1 tablet (75 mg total) by mouth daily. 10/08/13  Yes Newt LukesValerie A Leschber, MD  Ferrous Gluconate 256 (28 FE) MG TABS Take 256 mg by mouth daily.   Yes Historical Provider, MD  furosemide (LASIX) 40 MG tablet Take 1 tablet (40 mg total) by mouth 2 (two) times daily. 06/05/14  Yes Jessica U Vann, DO  LORazepam (ATIVAN) 1 MG tablet Take 1 tablet (1 mg total) by mouth every morning. 06/05/14  Yes Jessica U Vann, DO  LYRICA 200 MG capsule Take 200 mg by mouth 2 (two) times daily. 03/05/14  Yes Historical Provider, MD  Magnesium 250 MG TABS Take 250 mg by mouth daily.    Yes Historical Provider, MD  metoprolol tartrate (LOPRESSOR) 25 MG tablet Take 1 tablet (25 mg total) by mouth 2 (two) times daily. 06/05/14  Yes Joseph ArtJessica U Vann, DO  mirabegron ER (MYRBETRIQ) 50 MG TB24 tablet Take 50 mg by mouth daily.   Yes Historical Provider, MD  mirtazapine (REMERON) 15 MG tablet Take 1 tablet (15 mg total) by mouth at bedtime. 10/08/13  Yes Newt LukesValerie A Leschber, MD   Omega-3 Fatty Acids (FISH OIL) 1000 MG CAPS Take 1,000 mg by mouth daily.   Yes Historical Provider, MD  oxyCODONE-acetaminophen (PERCOCET) 10-325 MG per tablet Take 1 tablet by mouth every 8 (eight) hours as needed for pain. 06/05/14  Yes Jessica U Vann, DO  pantoprazole (PROTONIX) 40 MG tablet Take 1 tablet (40 mg total) by mouth 2 (two) times daily. 02/10/14  Yes Zannie CovePreetha Joseph, MD  ranitidine (ZANTAC) 150 MG tablet Take 150 mg by mouth as needed for heartburn.   Yes Historical Provider, MD  rOPINIRole (REQUIP) 4 MG tablet Take 4 mg by mouth at bedtime.   Yes Historical Provider, MD  sulfamethoxazole-trimethoprim (BACTRIM DS,SEPTRA DS) 800-160 MG per tablet Take 1 tablet by  mouth 2 (two) times daily. 07/10/14  Yes Juliet Rude. Pickering, MD  tamsulosin (FLOMAX) 0.4 MG CAPS capsule Take 1 capsule (0.4 mg total) by mouth daily. 10/08/13  Yes Newt Lukes, MD  ondansetron (ZOFRAN) 8 MG tablet Take 1 tablet (8 mg total) by mouth every 8 (eight) hours as needed for nausea or vomiting. 06/05/14   Jessica U Vann, DO   BP 123/42 mmHg  Pulse 59  Temp(Src) 97.7 F (36.5 C) (Oral)  Resp 21  SpO2 98% Physical Exam  Constitutional: He is oriented to person, place, and time. He appears well-developed and well-nourished. No distress.  HENT:  Head: Normocephalic and atraumatic.  Eyes: Conjunctivae and EOM are normal. Pupils are equal, round, and reactive to light. Right eye exhibits no discharge. Left eye exhibits no discharge.  Neck: Normal range of motion. Neck supple. No tracheal deviation present.  Negative neck stiffness Negative nuchal rigidity  Negative cervical lymphadenopathy  Negative meningeal signs   Cardiovascular: Normal rate, regular rhythm and normal heart sounds.  Exam reveals no friction rub.   No murmur heard. Pulses:      Radial pulses are 2+ on the right side, and 2+ on the left side.       Dorsalis pedis pulses are 2+ on the right side, and 2+ on the left side.  Cap refill  less than 3 seconds Swelling identified to lower tremors bilaterally with negative pitting edema-hard upon palpation.  Pulmonary/Chest: Effort normal and breath sounds normal. No respiratory distress. He has no wheezes. He has no rales. He exhibits no tenderness.  Patient is able to speak in full sentences without difficulty Negative use of accessory muscles Negative stridor  Abdominal:  Abdominal distention Bowel sounds normal active in all 4 quadrants Abdomen hard upon palpation Tympanic percussion  Musculoskeletal: Normal range of motion.  Full ROM to upper and lower extremities without difficulty noted, negative ataxia noted.  Lymphadenopathy:    He has no cervical adenopathy.  Neurological: He is alert and oriented to person, place, and time. No cranial nerve deficit. He exhibits normal muscle tone. Coordination normal.  Cranial nerves III-XII grossly intact Strength 5+/5+ to upper and lower extremities bilaterally with resistance applied, equal distribution noted Negative facial droop Negative slurred speech  Negative aphasia Negative arm drift Fine motor skills intact  Skin: Skin is warm and dry. No rash noted. He is not diaphoretic. No erythema.  Psychiatric: He has a normal mood and affect. His behavior is normal. Thought content normal.  Nursing note and vitals reviewed.   ED Course  Procedures (including critical care time)   Results for orders placed or performed during the hospital encounter of 07/13/14  CBC with Differential  Result Value Ref Range   WBC 5.3 4.0 - 10.5 K/uL   RBC 4.68 4.22 - 5.81 MIL/uL   Hemoglobin 13.5 13.0 - 17.0 g/dL   HCT 09.8 11.9 - 14.7 %   MCV 93.4 78.0 - 100.0 fL   MCH 28.8 26.0 - 34.0 pg   MCHC 30.9 30.0 - 36.0 g/dL   RDW 82.9 (H) 56.2 - 13.0 %   Platelets 109 (L) 150 - 400 K/uL   Neutrophils Relative % 59 43 - 77 %   Neutro Abs 3.1 1.7 - 7.7 K/uL   Lymphocytes Relative 25 12 - 46 %   Lymphs Abs 1.3 0.7 - 4.0 K/uL   Monocytes  Relative 11 3 - 12 %   Monocytes Absolute 0.6 0.1 - 1.0 K/uL  Eosinophils Relative 5 0 - 5 %   Eosinophils Absolute 0.3 0.0 - 0.7 K/uL   Basophils Relative 0 0 - 1 %   Basophils Absolute 0.0 0.0 - 0.1 K/uL  Comprehensive metabolic panel  Result Value Ref Range   Sodium 143 137 - 147 mEq/L   Potassium 4.9 3.7 - 5.3 mEq/L   Chloride 101 96 - 112 mEq/L   CO2 32 19 - 32 mEq/L   Glucose, Bld 106 (H) 70 - 99 mg/dL   BUN 35 (H) 6 - 23 mg/dL   Creatinine, Ser 1.61 (H) 0.50 - 1.35 mg/dL   Calcium 8.8 8.4 - 09.6 mg/dL   Total Protein 7.5 6.0 - 8.3 g/dL   Albumin 3.6 3.5 - 5.2 g/dL   AST 24 0 - 37 U/L   ALT 25 0 - 53 U/L   Alkaline Phosphatase 111 39 - 117 U/L   Total Bilirubin 0.3 0.3 - 1.2 mg/dL   GFR calc non Af Amer 35 (L) >90 mL/min   GFR calc Af Amer 40 (L) >90 mL/min   Anion gap 10 5 - 15  Troponin I  Result Value Ref Range   Troponin I <0.30 <0.30 ng/mL  Pro b natriuretic peptide (BNP)  Result Value Ref Range   Pro B Natriuretic peptide (BNP) 814.6 (H) 0 - 450 pg/mL  Urinalysis, Routine w reflex microscopic  Result Value Ref Range   Color, Urine YELLOW YELLOW   APPearance CLEAR CLEAR   Specific Gravity, Urine 1.015 1.005 - 1.030   pH 5.5 5.0 - 8.0   Glucose, UA NEGATIVE NEGATIVE mg/dL   Hgb urine dipstick NEGATIVE NEGATIVE   Bilirubin Urine NEGATIVE NEGATIVE   Ketones, ur NEGATIVE NEGATIVE mg/dL   Protein, ur NEGATIVE NEGATIVE mg/dL   Urobilinogen, UA 1.0 0.0 - 1.0 mg/dL   Nitrite NEGATIVE NEGATIVE   Leukocytes, UA NEGATIVE NEGATIVE  I-Stat arterial blood gas, ED  Result Value Ref Range   pH, Arterial 7.339 (L) 7.350 - 7.450   pCO2 arterial 55.6 (H) 35.0 - 45.0 mmHg   pO2, Arterial 67.0 (L) 80.0 - 100.0 mmHg   Bicarbonate 30.0 (H) 20.0 - 24.0 mEq/L   TCO2 32 0 - 100 mmol/L   O2 Saturation 91.0 %   Acid-Base Excess 3.0 (H) 0.0 - 2.0 mmol/L   Patient temperature 98.4 F    Collection site RADIAL, ALLEN'S TEST ACCEPTABLE    Drawn by Operator    Sample type ARTERIAL      Labs Review Labs Reviewed  CBC WITH DIFFERENTIAL - Abnormal; Notable for the following:    RDW 16.3 (*)    Platelets 109 (*)    All other components within normal limits  COMPREHENSIVE METABOLIC PANEL - Abnormal; Notable for the following:    Glucose, Bld 106 (*)    BUN 35 (*)    Creatinine, Ser 1.78 (*)    GFR calc non Af Amer 35 (*)    GFR calc Af Amer 40 (*)    All other components within normal limits  PRO B NATRIURETIC PEPTIDE - Abnormal; Notable for the following:    Pro B Natriuretic peptide (BNP) 814.6 (*)    All other components within normal limits  I-STAT ARTERIAL BLOOD GAS, ED - Abnormal; Notable for the following:    pH, Arterial 7.339 (*)    pCO2 arterial 55.6 (*)    pO2, Arterial 67.0 (*)    Bicarbonate 30.0 (*)    Acid-Base Excess 3.0 (*)  All other components within normal limits  TROPONIN I  URINALYSIS, ROUTINE W REFLEX MICROSCOPIC  BLOOD GAS, ARTERIAL  BLOOD GAS, ARTERIAL    Imaging Review Ct Head Wo Contrast  07/13/2014   CLINICAL DATA:  Headache, bilateral leg swelling  EXAM: CT HEAD WITHOUT CONTRAST  TECHNIQUE: Contiguous axial images were obtained from the base of the skull through the vertex without intravenous contrast.  COMPARISON:  05/29/2014  FINDINGS: Mild cortical volume loss noted with proportional ventricular prominence. Areas of periventricular white matter hypodensity are most compatible with small vessel ischemic change. No acute hemorrhage, infarct, or mass lesion is identified. No midline shift. Mild ethmoid mucoperiosteal thickening. No skull fracture. Remote nasal bone deformity reidentified.  IMPRESSION: No acute intracranial finding.   Electronically Signed   By: Christiana Pellant M.D.   On: 07/13/2014 23:40   Dg Chest Port 1 View  07/13/2014   CLINICAL DATA:  Initial evaluation for shortness of breath.  EXAM: PORTABLE CHEST - 1 VIEW  COMPARISON:  Prior radiograph from 07/10/2014.  FINDINGS: Cardiomegaly is stable from prior study.  Atherosclerotic calcifications noted within the aortic arch.  Lungs are mildly hypoinflated. Patchy and linear bibasilar opacities favored to reflect atelectasis, similar to prior. Mild diffuse bronchitic changes present. No focal infiltrates identified. No pulmonary edema or pleural effusion. No pneumothorax.  No acute osseus abnormality.  IMPRESSION: Shallow lung inflation with patchy and linear bibasilar atelectasis. Overall, the appearance of the chest is not significantly changed relative to 07/10/2014. No other active cardiopulmonary disease identified.   Electronically Signed   By: Rise Mu M.D.   On: 07/13/2014 20:22   Dg Abd 2 Views  07/13/2014   CLINICAL DATA:  Shortness of breath with abdominal pain for 3 days.  EXAM: ABDOMEN - 2 VIEW  COMPARISON:  Radiographs 07/10/2014. Chest radiographs done today are correlated.  FINDINGS: Somewhat nodular reticular densities at both lung bases are similar to recent studies and likely exacerbated by lower lung volumes on this study. The bowel gas pattern is normal. There is no free intraperitoneal air or bowel wall thickening. There are no suspicious abdominal calcifications. There are degenerative and postsurgical changes throughout the lumbar spine.  IMPRESSION: No acute abdominal findings.  The bowel gas pattern remains normal.   Electronically Signed   By: Roxy Horseman M.D.   On: 07/13/2014 21:10     EKG Interpretation   Date/Time:  Monday July 13 2014 19:29:57 EST Ventricular Rate:  98 PR Interval:    QRS Duration: 107 QT Interval:  444 QTC Calculation: 567 R Axis:   -23 Text Interpretation:  Sinus rhythm Incomplete left bundle branch block  Borderline prolonged QT interval artifact present No significant change  since last tracing Confirmed by GOLDSTON  MD, SCOTT ([id number]) on 07/13/2014  11:12:48 PM       12:14 AM This provider spoke with Dr. Welton Flakes, [facility] - discussed case, labs, imaging, vitals, ED course in great  detail. Reported that patient will need to be admitted to stepdown.   MDM   Final diagnoses:  Acute respiratory failure with hypercapnia  Delirium  Shortness of breath  Abdominal distension    Medications  albuterol (PROVENTIL) (2.5 MG/3ML) 0.083% nebulizer solution 5 mg (5 mg Nebulization Given 07/13/14 2054)    Filed Vitals:   07/13/14 2323 07/13/14 2330 07/13/14 2345 07/14/14 0000  BP:  147/56 121/80 123/42  Pulse:  59 63 59  Temp: 97.7 F (36.5 C)     TempSrc: Oral  Resp:  22 23 21   SpO2:  99% 99% 98%   This provider reviewed the patient's chart. Patient was admitted to the hospital for acute respiratory failure with hypercapnia on 05/28/2014 with patient need to be intubated. Patient was seen on 07/10/2014 regarding shortness of breath and abdominal distention-patient was diagnosed as having constipation discharge with suppositories. EKG sinus rhythm with heart rate of 98 bpm - LBBB, borderline prolonged QT intervals - no significant change since last tracing. Troponin negative elevation. BNP elevated at 814.6. CBC unremarkable. CMP noted BUN of 35, creatinine 1.78 - when compared to previous labs, patient's BUN has been as high as 37 and creatinine as high as 1.65 approximately one month ago. Glucose 106-negative elevated anion-10.0 mg/L. arterial blood gas noted pH of 7.339-elevated carbon dioxide of 55.6, compared to one month ago patient's cardiac side with 84.3. Bicarbonate elevated at 30.0. Urinalysis negative hemoglobin, nitrites, leukocytes-negative findings of UTI. Chest x-ray shows shallow lung inflation with patchy and linear bibasilar atelectasis-appearance of the chest has not significantly changed since 07/10/2014. Plain film of abdomen no acute abdominal findings-bowel gas pattern remains normal. Negative findings of pneumonia. Negative findings of fluid overload. Abdominal distension appears to be chronic issue. Negative acute intracranial processes noted. Negative  UTI or pyelonephritis. Patient placed on oxygen therapy via nasal cannula. Patient given albuterol nebulizer treatments with relief. Patient seen and assessed by attending physician who agrees to plan of admission. Discussed case with [facility] - patient to be admitted to [facility] unit for respiratory failure with hypercapnia with associated delirium. Discussed plan for admission with patient who understands and agrees to plan of care - family at bedside agree as well. Patient stable for transfer.    Raymon Mutton, PA-C 07/14/14 [id number]  Audree Camel, MD 07/15/14 [phone]

## 2014-07-13 NOTE — Telephone Encounter (Signed)
[facility] - Client TELEPHONE ADVICE RECORD [facility] Patient Name: [name] Gender: Male DOB: 03-Jul-1933 Age: 80 Y 5 M 8 D Return Phone Number: Address: City/State/Zip: [place] [facility] - Client Client Site [facility] - Night Physician [name] Contact Type Call Call Type Page Only Caller Name Lance Bosch Relationship To Patient Provider Is this call to report lab results? No Return Phone Number Unavailable Initial Comment caller is Lance Bosch - a home health with [facility] - is calling about a mutual pt - of Dr Felicity Coyer - he has taken too much of his antibiotic - septra - she was called by the patients dtr. CB# [phone] Nurse Assessment Guidelines Guideline Title Affirmed Question Affirmed Notes Nurse Date/Time Lamount Cohen Time) Disp. Time Lamount Cohen Time) Disposition Final User 07/12/2014 12:55:40 PM Send to [facility] Paging Queue Lesia Sago 07/12/2014 12:59:03 PM Called On-Call Provider Lesia Sago 07/12/2014 12:58:48 PM Page Completed Yes Lesia Sago After Care Instructions Given Call Event Type User Date / Time Description Paging DoctorName DoctorPhone DateTime Result/Outcome Notes Gershon Crane [phone] 07/12/2014 12:59:03 PM Called On Call Provider - Reached Gershon Crane 07/12/2014 1:00:10 PM Spoke with On Call - General Contacted the on call physician - Dr Gershon Crane - provided the caller information and connected him with [facility] @ [facility] @ [phone]

## 2014-07-13 NOTE — ED Notes (Signed)
Respiratory notified of pt order for blood gas.

## 2014-07-13 NOTE — ED Notes (Signed)
Pt states SOB, ongoing since Friday. Respirations unlabored, speaking complete sentences, expiratory wheezing noted upon arrival to ED. Denies chest pain. Swelling noted to bilateral lower legs. Pt is alert and oriented x4.

## 2014-07-13 NOTE — Telephone Encounter (Signed)
Noted Currently in ED Will follow up in AM - please call then to review symptoms thanks

## 2014-07-13 NOTE — Telephone Encounter (Signed)
Reporting low heart rate of today 48.  Bp today 138/61 Patient has been running high 40's / low 50's since bp med change  No other symptoms

## 2014-07-13 NOTE — ED Notes (Signed)
Pt presents to department via [facility] for evaluation of SOB. Ongoing since Friday. Denies cough. Respirations unlabored. Speaking complete sentences. Pt is alert and oriented x4. Received 5mg  Albuterol per EMS. 18g LAC.

## 2014-07-14 ENCOUNTER — Encounter (HOSPITAL_COMMUNITY): Payer: Self-pay | Admitting: Cardiology

## 2014-07-14 DIAGNOSIS — R001 Bradycardia, unspecified: Secondary | ICD-10-CM | POA: Diagnosis present

## 2014-07-14 DIAGNOSIS — I350 Nonrheumatic aortic (valve) stenosis: Secondary | ICD-10-CM

## 2014-07-14 DIAGNOSIS — I10 Essential (primary) hypertension: Secondary | ICD-10-CM | POA: Diagnosis present

## 2014-07-14 DIAGNOSIS — Z801 Family history of malignant neoplasm of trachea, bronchus and lung: Secondary | ICD-10-CM | POA: Diagnosis not present

## 2014-07-14 DIAGNOSIS — E119 Type 2 diabetes mellitus without complications: Secondary | ICD-10-CM | POA: Diagnosis present

## 2014-07-14 DIAGNOSIS — Z7902 Long term (current) use of antithrombotics/antiplatelets: Secondary | ICD-10-CM | POA: Diagnosis not present

## 2014-07-14 DIAGNOSIS — K567 Ileus, unspecified: Secondary | ICD-10-CM | POA: Insufficient documentation

## 2014-07-14 DIAGNOSIS — L03115 Cellulitis of right lower limb: Secondary | ICD-10-CM | POA: Diagnosis present

## 2014-07-14 DIAGNOSIS — Z803 Family history of malignant neoplasm of breast: Secondary | ICD-10-CM | POA: Diagnosis not present

## 2014-07-14 DIAGNOSIS — G4733 Obstructive sleep apnea (adult) (pediatric): Secondary | ICD-10-CM

## 2014-07-14 DIAGNOSIS — N400 Enlarged prostate without lower urinary tract symptoms: Secondary | ICD-10-CM | POA: Diagnosis present

## 2014-07-14 DIAGNOSIS — J9622 Acute and chronic respiratory failure with hypercapnia: Secondary | ICD-10-CM

## 2014-07-14 DIAGNOSIS — J9602 Acute respiratory failure with hypercapnia: Secondary | ICD-10-CM

## 2014-07-14 DIAGNOSIS — Z9861 Coronary angioplasty status: Secondary | ICD-10-CM | POA: Diagnosis not present

## 2014-07-14 DIAGNOSIS — E662 Morbid (severe) obesity with alveolar hypoventilation: Secondary | ICD-10-CM

## 2014-07-14 DIAGNOSIS — N183 Chronic kidney disease, stage 3 (moderate): Secondary | ICD-10-CM | POA: Diagnosis present

## 2014-07-14 DIAGNOSIS — G2581 Restless legs syndrome: Secondary | ICD-10-CM | POA: Diagnosis present

## 2014-07-14 DIAGNOSIS — G629 Polyneuropathy, unspecified: Secondary | ICD-10-CM | POA: Diagnosis present

## 2014-07-14 DIAGNOSIS — J9621 Acute and chronic respiratory failure with hypoxia: Secondary | ICD-10-CM | POA: Diagnosis not present

## 2014-07-14 DIAGNOSIS — L03116 Cellulitis of left lower limb: Secondary | ICD-10-CM | POA: Diagnosis present

## 2014-07-14 DIAGNOSIS — Z7982 Long term (current) use of aspirin: Secondary | ICD-10-CM | POA: Diagnosis not present

## 2014-07-14 DIAGNOSIS — J189 Pneumonia, unspecified organism: Secondary | ICD-10-CM

## 2014-07-14 DIAGNOSIS — Z8042 Family history of malignant neoplasm of prostate: Secondary | ICD-10-CM | POA: Diagnosis not present

## 2014-07-14 DIAGNOSIS — E785 Hyperlipidemia, unspecified: Secondary | ICD-10-CM

## 2014-07-14 DIAGNOSIS — Z8249 Family history of ischemic heart disease and other diseases of the circulatory system: Secondary | ICD-10-CM | POA: Diagnosis not present

## 2014-07-14 DIAGNOSIS — I2510 Atherosclerotic heart disease of native coronary artery without angina pectoris: Secondary | ICD-10-CM | POA: Diagnosis present

## 2014-07-14 DIAGNOSIS — N179 Acute kidney failure, unspecified: Secondary | ICD-10-CM | POA: Diagnosis present

## 2014-07-14 DIAGNOSIS — E8729 Other acidosis: Secondary | ICD-10-CM | POA: Insufficient documentation

## 2014-07-14 DIAGNOSIS — Z79899 Other long term (current) drug therapy: Secondary | ICD-10-CM | POA: Diagnosis not present

## 2014-07-14 DIAGNOSIS — R0602 Shortness of breath: Secondary | ICD-10-CM | POA: Diagnosis present

## 2014-07-14 DIAGNOSIS — D696 Thrombocytopenia, unspecified: Secondary | ICD-10-CM | POA: Diagnosis present

## 2014-07-14 DIAGNOSIS — I872 Venous insufficiency (chronic) (peripheral): Secondary | ICD-10-CM

## 2014-07-14 DIAGNOSIS — G9349 Other encephalopathy: Secondary | ICD-10-CM | POA: Diagnosis not present

## 2014-07-14 DIAGNOSIS — E872 Acidosis: Secondary | ICD-10-CM | POA: Insufficient documentation

## 2014-07-14 DIAGNOSIS — F329 Major depressive disorder, single episode, unspecified: Secondary | ICD-10-CM | POA: Diagnosis present

## 2014-07-14 DIAGNOSIS — I252 Old myocardial infarction: Secondary | ICD-10-CM | POA: Diagnosis not present

## 2014-07-14 DIAGNOSIS — K5900 Constipation, unspecified: Secondary | ICD-10-CM | POA: Diagnosis present

## 2014-07-14 DIAGNOSIS — Z9119 Patient's noncompliance with other medical treatment and regimen: Secondary | ICD-10-CM | POA: Diagnosis present

## 2014-07-14 DIAGNOSIS — Z6837 Body mass index (BMI) 37.0-37.9, adult: Secondary | ICD-10-CM | POA: Diagnosis not present

## 2014-07-14 DIAGNOSIS — K219 Gastro-esophageal reflux disease without esophagitis: Secondary | ICD-10-CM | POA: Diagnosis present

## 2014-07-14 DIAGNOSIS — I5033 Acute on chronic diastolic (congestive) heart failure: Secondary | ICD-10-CM | POA: Diagnosis not present

## 2014-07-14 DIAGNOSIS — Z833 Family history of diabetes mellitus: Secondary | ICD-10-CM | POA: Diagnosis not present

## 2014-07-14 DIAGNOSIS — E1151 Type 2 diabetes mellitus with diabetic peripheral angiopathy without gangrene: Secondary | ICD-10-CM

## 2014-07-14 DIAGNOSIS — Z9114 Patient's other noncompliance with medication regimen: Secondary | ICD-10-CM | POA: Diagnosis present

## 2014-07-14 DIAGNOSIS — Z8673 Personal history of transient ischemic attack (TIA), and cerebral infarction without residual deficits: Secondary | ICD-10-CM | POA: Diagnosis not present

## 2014-07-14 DIAGNOSIS — M109 Gout, unspecified: Secondary | ICD-10-CM | POA: Diagnosis present

## 2014-07-14 DIAGNOSIS — T50905A Adverse effect of unspecified drugs, medicaments and biological substances, initial encounter: Secondary | ICD-10-CM

## 2014-07-14 DIAGNOSIS — F419 Anxiety disorder, unspecified: Secondary | ICD-10-CM | POA: Diagnosis present

## 2014-07-14 LAB — I-STAT ARTERIAL BLOOD GAS, ED
Acid-Base Excess: 2 mmol/L (ref 0.0–2.0)
Bicarbonate: 30.9 mEq/L — ABNORMAL HIGH (ref 20.0–24.0)
O2 SAT: 95 %
Patient temperature: 98.6
TCO2: 33 mmol/L (ref 0–100)
pCO2 arterial: 69.4 mmHg (ref 35.0–45.0)
pH, Arterial: 7.256 — ABNORMAL LOW (ref 7.350–7.450)
pO2, Arterial: 92 mmHg (ref 80.0–100.0)

## 2014-07-14 LAB — HEMOGLOBIN A1C
Hgb A1c MFr Bld: 6.1 % — ABNORMAL HIGH (ref <5.7)
Mean Plasma Glucose: 128 mg/dL — ABNORMAL HIGH (ref <117)

## 2014-07-14 LAB — CBC
HCT: 42.5 % (ref 39.0–52.0)
Hemoglobin: 12.8 g/dL — ABNORMAL LOW (ref 13.0–17.0)
MCH: 28.2 pg (ref 26.0–34.0)
MCHC: 30.1 g/dL (ref 30.0–36.0)
MCV: 93.6 fL (ref 78.0–100.0)
PLATELETS: 106 10*3/uL — AB (ref 150–400)
RBC: 4.54 MIL/uL (ref 4.22–5.81)
RDW: 16.3 % — AB (ref 11.5–15.5)
WBC: 5.6 10*3/uL (ref 4.0–10.5)

## 2014-07-14 LAB — COMPREHENSIVE METABOLIC PANEL
ALBUMIN: 3.5 g/dL (ref 3.5–5.2)
ALT: 25 U/L (ref 0–53)
AST: 23 U/L (ref 0–37)
Alkaline Phosphatase: 95 U/L (ref 39–117)
Anion gap: 5 (ref 5–15)
BUN: 32 mg/dL — ABNORMAL HIGH (ref 6–23)
CHLORIDE: 103 meq/L (ref 96–112)
CO2: 32 mmol/L (ref 19–32)
Calcium: 8.5 mg/dL (ref 8.4–10.5)
Creatinine, Ser: 1.99 mg/dL — ABNORMAL HIGH (ref 0.50–1.35)
GFR calc Af Amer: 35 mL/min — ABNORMAL LOW (ref >90)
GFR, EST NON AFRICAN AMERICAN: 30 mL/min — AB (ref >90)
Glucose, Bld: 167 mg/dL — ABNORMAL HIGH (ref 70–99)
Potassium: 4.5 mmol/L (ref 3.5–5.1)
SODIUM: 140 mmol/L (ref 135–145)
Total Bilirubin: 0.4 mg/dL (ref 0.3–1.2)
Total Protein: 6.8 g/dL (ref 6.0–8.3)

## 2014-07-14 LAB — CBG MONITORING, ED: Glucose-Capillary: 150 mg/dL — ABNORMAL HIGH (ref 70–99)

## 2014-07-14 LAB — GLUCOSE, CAPILLARY
Glucose-Capillary: 99 mg/dL (ref 70–99)
Glucose-Capillary: 141 mg/dL — ABNORMAL HIGH (ref 70–99)
Glucose-Capillary: 124 mg/dL — ABNORMAL HIGH (ref 70–99)

## 2014-07-14 LAB — PROCALCITONIN

## 2014-07-14 LAB — MRSA PCR SCREENING: MRSA BY PCR: NEGATIVE

## 2014-07-14 LAB — TROPONIN I: Troponin I: <0.03 ng/mL (ref <0.031)

## 2014-07-14 LAB — TSH: TSH: 3.608 u[IU]/mL (ref 0.350–4.500)

## 2014-07-14 MED ORDER — SODIUM CHLORIDE 0.9 % IV SOLN
2000.0000 mg | Freq: Once | INTRAVENOUS | Status: AC
  Administered 2014-07-14: 2000 mg via INTRAVENOUS
  Filled 2014-07-14: qty 2000

## 2014-07-14 MED ORDER — ROPINIROLE HCL 1 MG PO TABS
4.0000 mg | ORAL_TABLET | Freq: Every day | ORAL | Status: DC
Start: 2014-07-14 — End: 2014-07-16
  Administered 2014-07-14 – 2014-07-15 (×3): 4 mg via ORAL
  Filled 2014-07-14 (×5): qty 4

## 2014-07-14 MED ORDER — IPRATROPIUM-ALBUTEROL 0.5-2.5 (3) MG/3ML IN SOLN
3.0000 mL | RESPIRATORY_TRACT | Status: DC
  Administered 2014-07-14: 3 mL via RESPIRATORY_TRACT
  Filled 2014-07-14: qty 3

## 2014-07-14 MED ORDER — FOLIC ACID 1 MG PO TABS
1.0000 mg | ORAL_TABLET | Freq: Every day | ORAL | Status: DC
  Administered 2014-07-14 – 2014-07-16 (×3): 1 mg via ORAL
  Filled 2014-07-14 (×3): qty 1

## 2014-07-14 MED ORDER — FUROSEMIDE 10 MG/ML IJ SOLN
60.0000 mg | Freq: Two times a day (BID) | INTRAMUSCULAR | Status: DC
  Administered 2014-07-14 – 2014-07-16 (×5): 60 mg via INTRAVENOUS
  Filled 2014-07-14 (×7): qty 6

## 2014-07-14 MED ORDER — VANCOMYCIN HCL 10 G IV SOLR
1250.0000 mg | INTRAVENOUS | Status: DC
  Administered 2014-07-15: 1250 mg via INTRAVENOUS
  Filled 2014-07-14 (×2): qty 1250

## 2014-07-14 MED ORDER — ALLOPURINOL 300 MG PO TABS
300.0000 mg | ORAL_TABLET | Freq: Every morning | ORAL | Status: DC
  Administered 2014-07-14 – 2014-07-16 (×3): 300 mg via ORAL
  Filled 2014-07-14 (×3): qty 1

## 2014-07-14 MED ORDER — OXYCODONE HCL 5 MG PO TABS
5.0000 mg | ORAL_TABLET | ORAL | Status: DC | PRN
  Administered 2014-07-14: 5 mg via ORAL
  Filled 2014-07-14 (×2): qty 1

## 2014-07-14 MED ORDER — ADULT MULTIVITAMIN W/MINERALS CH
1.0000 | ORAL_TABLET | Freq: Every day | ORAL | Status: DC
  Administered 2014-07-14 – 2014-07-16 (×3): 1 via ORAL
  Filled 2014-07-14 (×3): qty 1

## 2014-07-14 MED ORDER — CEFTRIAXONE SODIUM IN DEXTROSE 20 MG/ML IV SOLN
1.0000 g | INTRAVENOUS | Status: DC
  Filled 2014-07-14: qty 50

## 2014-07-14 MED ORDER — MIRABEGRON ER 50 MG PO TB24
50.0000 mg | ORAL_TABLET | Freq: Every day | ORAL | Status: DC
  Administered 2014-07-14 – 2014-07-16 (×3): 50 mg via ORAL
  Filled 2014-07-14 (×3): qty 1

## 2014-07-14 MED ORDER — IPRATROPIUM-ALBUTEROL 0.5-2.5 (3) MG/3ML IN SOLN
3.0000 mL | Freq: Three times a day (TID) | RESPIRATORY_TRACT | Status: DC
  Administered 2014-07-14 – 2014-07-16 (×6): 3 mL via RESPIRATORY_TRACT
  Filled 2014-07-14 (×6): qty 3

## 2014-07-14 MED ORDER — MIRTAZAPINE 15 MG PO TABS
15.0000 mg | ORAL_TABLET | Freq: Every day | ORAL | Status: DC
  Administered 2014-07-14 (×2): 15 mg via ORAL
  Filled 2014-07-14 (×4): qty 1

## 2014-07-14 MED ORDER — PANTOPRAZOLE SODIUM 40 MG PO TBEC
40.0000 mg | DELAYED_RELEASE_TABLET | Freq: Two times a day (BID) | ORAL | Status: DC
  Administered 2014-07-14 – 2014-07-16 (×6): 40 mg via ORAL
  Filled 2014-07-14 (×6): qty 1

## 2014-07-14 MED ORDER — PREGABALIN 25 MG PO CAPS
150.0000 mg | ORAL_CAPSULE | Freq: Two times a day (BID) | ORAL | Status: DC
  Administered 2014-07-14 – 2014-07-16 (×4): 150 mg via ORAL
  Filled 2014-07-14: qty 2
  Filled 2014-07-14: qty 1
  Filled 2014-07-14 (×2): qty 2
  Filled 2014-07-14: qty 1
  Filled 2014-07-14: qty 2

## 2014-07-14 MED ORDER — DOCUSATE SODIUM 100 MG PO CAPS
100.0000 mg | ORAL_CAPSULE | Freq: Two times a day (BID) | ORAL | Status: DC
  Administered 2014-07-14 – 2014-07-16 (×5): 100 mg via ORAL
  Filled 2014-07-14 (×6): qty 1

## 2014-07-14 MED ORDER — INSULIN ASPART 100 UNIT/ML ~~LOC~~ SOLN
0.0000 [IU] | Freq: Three times a day (TID) | SUBCUTANEOUS | Status: DC
Start: 2014-07-14 — End: 2014-07-16
  Administered 2014-07-15: 3 [IU] via SUBCUTANEOUS

## 2014-07-14 MED ORDER — DEXTROSE 5 % IV SOLN
2.0000 g | INTRAVENOUS | Status: DC
  Administered 2014-07-14 – 2014-07-15 (×2): 2 g via INTRAVENOUS
  Filled 2014-07-14 (×3): qty 2

## 2014-07-14 MED ORDER — CLOPIDOGREL BISULFATE 75 MG PO TABS
75.0000 mg | ORAL_TABLET | Freq: Every day | ORAL | Status: DC
  Administered 2014-07-14 – 2014-07-16 (×3): 75 mg via ORAL
  Filled 2014-07-14 (×3): qty 1

## 2014-07-14 MED ORDER — VITAMIN B-1 100 MG PO TABS
100.0000 mg | ORAL_TABLET | Freq: Every day | ORAL | Status: DC
  Administered 2014-07-14 – 2014-07-16 (×3): 100 mg via ORAL
  Filled 2014-07-14 (×3): qty 1

## 2014-07-14 MED ORDER — FERROUS GLUCONATE 324 (38 FE) MG PO TABS
324.0000 mg | ORAL_TABLET | Freq: Every day | ORAL | Status: DC
  Administered 2014-07-14 – 2014-07-16 (×3): 324 mg via ORAL
  Filled 2014-07-14 (×3): qty 1

## 2014-07-14 MED ORDER — ONDANSETRON HCL 4 MG/2ML IJ SOLN: 4.0000 mg | Freq: Four times a day (QID) | INTRAMUSCULAR | Status: DC | PRN

## 2014-07-14 MED ORDER — ASPIRIN EC 81 MG PO TBEC
81.0000 mg | DELAYED_RELEASE_TABLET | Freq: Every day | ORAL | Status: DC
Start: 2014-07-14 — End: 2014-07-16
  Administered 2014-07-14 – 2014-07-16 (×3): 81 mg via ORAL
  Filled 2014-07-14 (×3): qty 1

## 2014-07-14 MED ORDER — MAGNESIUM HYDROXIDE 400 MG/5ML PO SUSP
30.0000 mL | Freq: Once | ORAL | Status: AC
  Administered 2014-07-14: 30 mL via ORAL
  Filled 2014-07-14: qty 30

## 2014-07-14 MED ORDER — IPRATROPIUM-ALBUTEROL 0.5-2.5 (3) MG/3ML IN SOLN: 3.0000 mL | RESPIRATORY_TRACT | Status: DC | PRN

## 2014-07-14 MED ORDER — ONDANSETRON HCL 4 MG PO TABS: 4.0000 mg | ORAL_TABLET | Freq: Four times a day (QID) | ORAL | Status: DC | PRN

## 2014-07-14 MED ORDER — BUPROPION HCL ER (XL) 150 MG PO TB24
150.0000 mg | ORAL_TABLET | Freq: Every morning | ORAL | Status: DC
  Administered 2014-07-14 – 2014-07-16 (×3): 150 mg via ORAL
  Filled 2014-07-14 (×3): qty 1

## 2014-07-14 MED ORDER — TAMSULOSIN HCL 0.4 MG PO CAPS
0.4000 mg | ORAL_CAPSULE | Freq: Every day | ORAL | Status: DC
  Administered 2014-07-14 – 2014-07-16 (×3): 0.4 mg via ORAL
  Filled 2014-07-14 (×3): qty 1

## 2014-07-14 MED ORDER — CETYLPYRIDINIUM CHLORIDE 0.05 % MT LIQD
7.0000 mL | Freq: Two times a day (BID) | OROMUCOSAL | Status: DC
  Administered 2014-07-14 – 2014-07-16 (×5): 7 mL via OROMUCOSAL

## 2014-07-14 MED ORDER — SODIUM CHLORIDE 0.9 % IV SOLN
INTRAVENOUS | Status: DC
  Administered 2014-07-14: 50 mL/h via INTRAVENOUS

## 2014-07-14 MED ORDER — PREGABALIN 100 MG PO CAPS
200.0000 mg | ORAL_CAPSULE | Freq: Two times a day (BID) | ORAL | Status: DC
  Administered 2014-07-14 (×2): 200 mg via ORAL
  Filled 2014-07-14: qty 1
  Filled 2014-07-14 (×2): qty 2

## 2014-07-14 MED ORDER — METOPROLOL TARTRATE 25 MG PO TABS
25.0000 mg | ORAL_TABLET | Freq: Two times a day (BID) | ORAL | Status: DC
  Administered 2014-07-14: 25 mg via ORAL
  Filled 2014-07-14 (×3): qty 1

## 2014-07-14 MED ORDER — SODIUM CHLORIDE 0.9 % IJ SOLN
3.0000 mL | Freq: Two times a day (BID) | INTRAMUSCULAR | Status: DC
  Administered 2014-07-14 – 2014-07-16 (×5): 3 mL via INTRAVENOUS

## 2014-07-14 MED ORDER — POLYETHYLENE GLYCOL 3350 17 G PO PACK
17.0000 g | PACK | Freq: Every day | ORAL | Status: DC
  Administered 2014-07-14 – 2014-07-15 (×2): 17 g via ORAL
  Filled 2014-07-14 (×2): qty 1

## 2014-07-14 NOTE — ED Notes (Signed)
RT called and informed of BIPAP order 

## 2014-07-14 NOTE — ED Notes (Signed)
Admitting paged to inform of ABG.

## 2014-07-14 NOTE — Progress Notes (Signed)
Placed patient on CPAP set at 10cm and oxygen set at 30%.

## 2014-07-14 NOTE — Progress Notes (Signed)
Pt transported to 2S on BIPAP with RN at this time. No complications.

## 2014-07-14 NOTE — Consult Note (Addendum)
Cardiology Consult Note  Admit date: 07/13/2014 Name: [name] 80 y.o.  male DOB:  [birth year] MRN:  [id number]  Today's date:  07/14/2014  Referring Physician:   [facility]  Primary Physician:    Dr. Bayard Hugger  Reason for Consultation:    Acute respiratory failure and volume overload.  IMPRESSIONS: 1.  Acute hypercarbic respiratory failure. 2.  Acute on chronic diastolic heart failure. 3.  Moderate aortic stenosis previously. 4.  Coronary artery disease with previous PCI. 5.  Severe obesity. 6.  Stage III chronic kidney disease. 7.  Cellulitis of legs 8.  Sleep apnea. 9.  Possible obesity hypoventilation syndrome. 10.  Encephalopathy -likely related to hypercarbia 11.  Prior history of bradycardia, likely related to underlying respiratory failure-continues with mild sinus bradycardia off of medicines  RECOMMENDATION: I would get pulmonary consultation again.  The patient is admitted with again a other episode of acute hypercarbic respiratory failure.  Although volume overload may be contributing to this, I do not think that this is a primary cardiac event.  I suspect that his underlying respiratory status may be a combination of sleep apnea, obesity hypoventilation, and he may be exquisitely sensitive to medicines that sedate him or to oxygen.  In the meantime continue diuresis.  I think he will need to be diuresed as long as his renal function will allow it.  On discharge from the hospital it is imperative that he be weighed daily and have special attention to adequate diuresis maintained as an outpatient.  HISTORY: This 80 year old male was recently admitted to the hospital with hypercarbic respiratory failure.  He ultimately required intubation and had some mild hypotension.  He was diuresed and also eventually was weaned from the ventilator.  He spent time in rehabilitation and ultimately was discharged home.  There is a question about whether his C Pap device is  working and he has been followed by pulmonary.  He has been mumbling and talking out of his head at night since going home.  He has not had any chest pain.  He was in the emergency room a few days ago with abdominal pain and placed on Septra.  He presented to the emergency room with increasing shortness of breath and was again found to be in hypercarbic respiratory failure and required BiPAP.  According to the chart he has had a 13 pound weight gain since discharge from the hospital.  Past Medical History  Diagnosis Date  . Gout, unspecified     severe dz, "treatment done that last 15 years"  . Obesity (BMI 30-39.9)   . Restless leg syndrome   . History of retinal detachment 1994  . Hypertensive heart disease   . CAD (coronary artery disease), native coronary artery   . History of pericarditis 2007    MSSA, s/p pericardial window  . GERD   . DEGENERATIVE JOINT DISEASE   . Sleep apnea in adult     CPAP qhs  . TIA (transient ischemic attack) 2005  . BPH (benign prostatic hypertrophy)     "minor"  . Lumbar spinal stenosis   . Carotid artery occlusion   . Unspecified venous (peripheral) insufficiency   . Stroke May 2003  . MYOCARDIAL INFARCTION 2004, 03/2010    "minor"  . Anxiety   . Depression       Past Surgical History  Procedure Laterality Date  . Pericardial window  2007  . Left arm  2008    shoulder  . Right arm  1970's  shoulder  . Cataract extraction      Left side x's 2 and right  . Retinal detachment surgery      left side  . Carotid endarterectomy Left 12-06-05    cea  . Coronary angioplasty  01/2009    x 1 stent  . Colonoscopy    . Carpal tunnel release Bilateral   . Back surgery  2013    removed bone spurs  . Inguinal hernia repair Left 10/01/2013    Procedure: HERNIA REPAIR INGUINAL ADULT;  Surgeon: Axel FillerArmando Ramirez, MD;  Location: [facility];  Service: General;  Laterality: Left;  . Insertion of mesh Left 10/01/2013    Procedure: INSERTION OF MESH;  Surgeon:  Axel FillerArmando Ramirez, MD;  Location: [facility];  Service: General;  Laterality: Left;  . Hernia repair    . Finger amputation  2015    JUST TO FIRST JOINT RIGHT HAND  LAST FINGER     Allergies:  is allergic to statins.   Medications: Prior to Admission medications   Medication Sig Start Date End Date Taking? Authorizing Provider  allopurinol (ZYLOPRIM) 300 MG tablet Take 300 mg by mouth every morning. 03/03/14  Yes Historical Provider, MD  aspirin EC 81 MG tablet Take 81 mg by mouth daily.   Yes Historical Provider, MD  bisacodyl (DULCOLAX) 10 MG suppository Place 1 suppository (10 mg total) rectally daily as needed for moderate constipation. 06/05/14  Yes Joseph ArtJessica U Vann, DO  buPROPion (WELLBUTRIN XL) 150 MG 24 hr tablet Take 1 tablet (150 mg total) by mouth every morning. 10/08/13  Yes Newt LukesValerie A Leschber, MD  cholecalciferol (VITAMIN D) 1000 UNITS tablet Take 1,000 Units by mouth daily.   Yes Historical Provider, MD  clopidogrel (PLAVIX) 75 MG tablet Take 1 tablet (75 mg total) by mouth daily. 10/08/13  Yes Newt LukesValerie A Leschber, MD  Ferrous Gluconate 256 (28 FE) MG TABS Take 256 mg by mouth daily.   Yes Historical Provider, MD  furosemide (LASIX) 40 MG tablet Take 1 tablet (40 mg total) by mouth 2 (two) times daily. 06/05/14  Yes Jessica U Vann, DO  LORazepam (ATIVAN) 1 MG tablet Take 1 tablet (1 mg total) by mouth every morning. 06/05/14  Yes Jessica U Vann, DO  LYRICA 200 MG capsule Take 200 mg by mouth 2 (two) times daily. 03/05/14  Yes Historical Provider, MD  Magnesium 250 MG TABS Take 250 mg by mouth daily.    Yes Historical Provider, MD  metoprolol tartrate (LOPRESSOR) 25 MG tablet Take 1 tablet (25 mg total) by mouth 2 (two) times daily. 06/05/14  Yes Joseph ArtJessica U Vann, DO  mirabegron ER (MYRBETRIQ) 50 MG TB24 tablet Take 50 mg by mouth daily.   Yes Historical Provider, MD  mirtazapine (REMERON) 15 MG tablet Take 1 tablet (15 mg total) by mouth at bedtime. 10/08/13  Yes Newt LukesValerie A Leschber, MD   Omega-3 Fatty Acids (FISH OIL) 1000 MG CAPS Take 1,000 mg by mouth daily.   Yes Historical Provider, MD  oxyCODONE-acetaminophen (PERCOCET) 10-325 MG per tablet Take 1 tablet by mouth every 8 (eight) hours as needed for pain. 06/05/14  Yes Jessica U Vann, DO  pantoprazole (PROTONIX) 40 MG tablet Take 1 tablet (40 mg total) by mouth 2 (two) times daily. 02/10/14  Yes Zannie CovePreetha Joseph, MD  ranitidine (ZANTAC) 150 MG tablet Take 150 mg by mouth as needed for heartburn.   Yes Historical Provider, MD  rOPINIRole (REQUIP) 4 MG tablet Take 4 mg by mouth at bedtime.  Yes Historical Provider, MD  sulfamethoxazole-trimethoprim (BACTRIM DS,SEPTRA DS) 800-160 MG per tablet Take 1 tablet by mouth 2 (two) times daily. 07/10/14  Yes Juliet Rude. Pickering, MD  tamsulosin (FLOMAX) 0.4 MG CAPS capsule Take 1 capsule (0.4 mg total) by mouth daily. 10/08/13  Yes Newt Lukes, MD  ondansetron (ZOFRAN) 8 MG tablet Take 1 tablet (8 mg total) by mouth every 8 (eight) hours as needed for nausea or vomiting. 06/05/14   Joseph Art, DO    Family History: Family Status  Relation Status Death Age  . Mother Deceased   . Father Deceased     Social History:   reports that he has never smoked. He has never used smokeless tobacco. He reports that he does not drink alcohol or use illicit drugs.   History   Social History Narrative   Lives alone     Review of Systems: He has been obese for many years and unable to lose weight.  Does have some encephalopathy, mild swelling of legs, recent abdominal pain, urinary frequency and urgency as well as hesitancy.  Physical Exam: BP 138/60 mmHg  Pulse 52  Temp(Src) 97.6 F (36.4 C) (Oral)  Resp 22  Wt 113.4 kg (250 lb)  SpO2 95%   General appearance: Very large, obese, plethoric appearing male in no acute distress, lying in bed Head: Normocephalic, without obvious abnormality, atraumatic Neck: no adenopathy, no carotid bruit, supple, symmetrical, trachea midline  and JVD is difficult to assess Lungs: Reduced breath sounds at bases Heart: Regular rate and rhythm, normal S1, S2, 2/6 murmur of aortic stenosis present at aortic valve radiating somewhat to carotids Abdomen: Very large, soft, distended and nontender Rectal: deferred Extremities: Some mild erythema and mild early cellulitis of both lower extremities, 1-2+ peripheral edema Pulses: 2+ and symmetric Neurologic: Grossly normal  Labs: CBC  Recent Labs  07/13/14 1928 07/14/14 0133  WBC 5.3 5.6  RBC 4.68 4.54  HGB 13.5 12.8*  HCT 43.7 42.5  PLT 109* 106*  MCV 93.4 93.6  MCH 28.8 28.2  MCHC 30.9 30.1  RDW 16.3* 16.3*  LYMPHSABS 1.3  --   MONOABS 0.6  --   EOSABS 0.3  --   BASOSABS 0.0  --    CMP   Recent Labs  07/14/14 0133  NA 140  K 4.5  CL 103  CO2 32  GLUCOSE 167*  BUN 32*  CREATININE 1.99*  CALCIUM 8.5  PROT 6.8  ALBUMIN 3.5  AST 23  ALT 25  ALKPHOS 95  BILITOT 0.4  GFRNONAA 30*  GFRAA 35*   BNP (last 3 results)  Recent Labs  05/28/14 1508 07/10/14 0801 07/13/14 1928  PROBNP 477.3* 422.7 814.6*   Cardiac Panel (last 3 results)  Recent Labs  07/13/14 1928 07/14/14 0132 07/14/14 0740  TROPONINI <0.30 <0.03 <0.03     Radiology: Basilar infiltrates noted  EKG: Sinus bradycardia, IV conduction delay, type undetermined  Signed:  [name] MD [facility]   Cardiology Consultant  07/14/2014, 1:29 PM

## 2014-07-14 NOTE — Telephone Encounter (Signed)
Pt has been admitted to the hospital. Will arrange OV after pt is discharged.

## 2014-07-14 NOTE — Consult Note (Addendum)
WOC wound consult note Reason for Consult: Consult requested for bilat legs.  Pt has several partial thickness abrasions and states he frequently bumps them at home; they were weeping prior to admission.  Left calf without any open wounds or drainage; dry scabs remove easily revealing pink patchy areas of healing partial thickness wounds.   Right calf with patchy areas of partial thickness abrasions; largest is 1X1X.1cm, moist red wound bed, white macerated skin surrounding.  Small amt yellow drainage, no odor. Other areas with patchy partail thickness skin loss. Dressing procedure/placement/frequency: Foam dressing to absorb drainage and promote healing. Please re-consult if further assistance is needed.  Thank-you,  Cammie Mcgee MSN, RN, CWOCN, [place], CNS [phone]

## 2014-07-14 NOTE — H&P (Signed)
[facility] History and Physical  [name] ZOX:[id number] DOB: [birth year] DOA: 07/13/2014  Referring physician: Raymon Mutton, PA PCP: [name], MD   Chief Complaint: Shortness of breath  HPI: [name] is a 80 y.o. male presents with altered mental status and shortness of breath. Patient is not able to provide history. His family states that he was here Friday with similar complaints. Patient since then was discharged home and the family has noted he has not had much energy. He gets shortness of breath on minimal exertion. Patient apparenlty has been pretty active prior to his last admission which had resulted in intubation due to respiratory failure. Patient has had some wheeze noted. He has had no chest pain noted. He has had edema but is improved. He had a UTI recently and was treated for this with bactrim. He has not complained of anything else.   Review of Systems:  Patient is not able to provide ROS  Past Medical History  Diagnosis Date  . Gout, unspecified     severe dz, "treatment done that last 15 years"  . OBESITY   . RESTLESS LEG SYNDROME   . DETACHMENT, RETINAL NOS 1994  . CATARACT NOS 2008  . HYPERTENSION   . CORONARY ARTERY DISEASE   . PERICARDITIS, ACUTE NEC 2007    MSSA, s/p pericardial window  . GERD   . DEGENERATIVE JOINT DISEASE   . CAROTID ENDARTERECTOMY, LEFT, HX OF   . OBSTRUCTIVE SLEEP APNEA     CPAP qhs  . TIA (transient ischemic attack) 2005  . BPH (benign prostatic hypertrophy)     "minor"  . Lumbar spinal stenosis   . Carotid artery occlusion   . Unspecified venous (peripheral) insufficiency   . Stroke May 2003  . MYOCARDIAL INFARCTION 2004, 03/2010    "minor"  . Anxiety   . Depression   . Shortness of breath    Past Surgical History  Procedure Laterality Date  . Pericardial window  2007  . Left arm  2008    shoulder  . Right arm  1970's    shoulder  . Cataract extraction      Left side x's 2 and right  .  Retinal detachment surgery      left side  . Carotid endarterectomy Left 12-06-05    cea  . Coronary angioplasty  01/2009    x 1 stent  . Colonoscopy    . Carpal tunnel release Bilateral   . Back surgery  2013    removed bone spurs  . Inguinal hernia repair Left 10/01/2013    Procedure: HERNIA REPAIR INGUINAL ADULT;  Surgeon: Axel Filler, MD;  Location: [facility];  Service: General;  Laterality: Left;  . Insertion of mesh Left 10/01/2013    Procedure: INSERTION OF MESH;  Surgeon: Axel Filler, MD;  Location: [facility];  Service: General;  Laterality: Left;  . Hernia repair    . Finger amputation  2015    JUST TO FIRST JOINT RIGHT HAND  LAST FINGER   Social History:  reports that he has never smoked. He has never used smokeless tobacco. He reports that he does not drink alcohol or use illicit drugs.  Allergies  Allergen Reactions  . Statins Other (See Comments)    Severe leg myalgias, weakness    Family History  Problem Relation Age of Onset  . Heart disease Mother   . Diabetes Father   . Heart disease Father   . Breast cancer Sister   .  Cancer Sister   . Prostate cancer Brother   . Lung cancer Brother   . Cancer Brother   . Diabetes Daughter   . Diabetes Son      Prior to Admission medications   Medication Sig Start Date End Date Taking? Authorizing Provider  allopurinol (ZYLOPRIM) 300 MG tablet Take 300 mg by mouth every morning. 03/03/14  Yes Historical Provider, MD  aspirin EC 81 MG tablet Take 81 mg by mouth daily.   Yes Historical Provider, MD  bisacodyl (DULCOLAX) 10 MG suppository Place 1 suppository (10 mg total) rectally daily as needed for moderate constipation. 06/05/14  Yes Joseph Art, DO  buPROPion (WELLBUTRIN XL) 150 MG 24 hr tablet Take 1 tablet (150 mg total) by mouth every morning. 10/08/13  Yes Newt Lukes, MD  cholecalciferol (VITAMIN D) 1000 UNITS tablet Take 1,000 Units by mouth daily.   Yes Historical Provider, MD  clopidogrel (PLAVIX) 75 MG  tablet Take 1 tablet (75 mg total) by mouth daily. 10/08/13  Yes Newt Lukes, MD  Ferrous Gluconate 256 (28 FE) MG TABS Take 256 mg by mouth daily.   Yes Historical Provider, MD  furosemide (LASIX) 40 MG tablet Take 1 tablet (40 mg total) by mouth 2 (two) times daily. 06/05/14  Yes Jessica U Vann, DO  LORazepam (ATIVAN) 1 MG tablet Take 1 tablet (1 mg total) by mouth every morning. 06/05/14  Yes Jessica U Vann, DO  LYRICA 200 MG capsule Take 200 mg by mouth 2 (two) times daily. 03/05/14  Yes Historical Provider, MD  Magnesium 250 MG TABS Take 250 mg by mouth daily.    Yes Historical Provider, MD  metoprolol tartrate (LOPRESSOR) 25 MG tablet Take 1 tablet (25 mg total) by mouth 2 (two) times daily. 06/05/14  Yes Joseph Art, DO  mirabegron ER (MYRBETRIQ) 50 MG TB24 tablet Take 50 mg by mouth daily.   Yes Historical Provider, MD  mirtazapine (REMERON) 15 MG tablet Take 1 tablet (15 mg total) by mouth at bedtime. 10/08/13  Yes Newt Lukes, MD  Omega-3 Fatty Acids (FISH OIL) 1000 MG CAPS Take 1,000 mg by mouth daily.   Yes Historical Provider, MD  oxyCODONE-acetaminophen (PERCOCET) 10-325 MG per tablet Take 1 tablet by mouth every 8 (eight) hours as needed for pain. 06/05/14  Yes Jessica U Vann, DO  pantoprazole (PROTONIX) 40 MG tablet Take 1 tablet (40 mg total) by mouth 2 (two) times daily. 02/10/14  Yes Zannie Cove, MD  ranitidine (ZANTAC) 150 MG tablet Take 150 mg by mouth as needed for heartburn.   Yes Historical Provider, MD  rOPINIRole (REQUIP) 4 MG tablet Take 4 mg by mouth at bedtime.   Yes Historical Provider, MD  sulfamethoxazole-trimethoprim (BACTRIM DS,SEPTRA DS) 800-160 MG per tablet Take 1 tablet by mouth 2 (two) times daily. 07/10/14  Yes Juliet Rude. Pickering, MD  tamsulosin (FLOMAX) 0.4 MG CAPS capsule Take 1 capsule (0.4 mg total) by mouth daily. 10/08/13  Yes Newt Lukes, MD  ondansetron (ZOFRAN) 8 MG tablet Take 1 tablet (8 mg total) by mouth every 8 (eight) hours  as needed for nausea or vomiting. 06/05/14   Joseph Art, DO   Physical Exam: Filed Vitals:   07/13/14 2323 07/13/14 2330 07/13/14 2345 07/14/14 0000  BP:  147/56 121/80 123/42  Pulse:  59 63 59  Temp: 97.7 F (36.5 C)     TempSrc: Oral     Resp:  22 23 21   SpO2:  99%  99% 98%    Wt Readings from Last 3 Encounters:  07/10/14 113.399 kg (250 lb)  07/08/14 113.944 kg (251 lb 3.2 oz)  06/29/14 112.038 kg (247 lb)    General:  Appears calm and comfortable Eyes: PERRL, normal lids, irises & conjunctiva ENT: grossly normal hearing Neck: no LAD, masses or thyromegaly Cardiovascular: RRR, ++murmur. Trace LE edema Respiratory: ++ronchi noted normal effort Abdomen: soft, ntnd Skin: ++rash on both LE Musculoskeletal: grossly normal tone BUE/BLE Psychiatric: somnolent Neurologic: grossly non-focal.          Labs on Admission:  Basic Metabolic Panel:  Recent Labs Lab 07/10/14 0801 07/13/14 1928  NA 144 143  K 5.0 4.9  CL 102 101  CO2 32 32  GLUCOSE 102* 106*  BUN 31* 35*  CREATININE 1.28 1.78*  CALCIUM 9.3 8.8   Liver Function Tests:  Recent Labs Lab 07/10/14 0801 07/13/14 1928  AST 27 24  ALT 18 25  ALKPHOS 114 111  BILITOT 0.4 0.3  PROT 8.1 7.5  ALBUMIN 3.8 3.6    Recent Labs Lab 07/10/14 0801  LIPASE 45   No results for input(s): AMMONIA in the last 168 hours. CBC:  Recent Labs Lab 07/10/14 0801 07/13/14 1928  WBC 5.7 5.3  NEUTROABS 3.2 3.1  HGB 15.3 13.5  HCT 47.7 43.7  MCV 94.1 93.4  PLT 112* 109*   Cardiac Enzymes:  Recent Labs Lab 07/13/14 1928  TROPONINI <0.30    BNP (last 3 results)  Recent Labs  05/28/14 1508 07/10/14 0801 07/13/14 1928  PROBNP 477.3* 422.7 814.6*   CBG: No results for input(s): GLUCAP in the last 168 hours.  Radiological Exams on Admission: Ct Head Wo Contrast  07/13/2014   CLINICAL DATA:  Headache, bilateral leg swelling  EXAM: CT HEAD WITHOUT CONTRAST  TECHNIQUE: Contiguous axial images were  obtained from the base of the skull through the vertex without intravenous contrast.  COMPARISON:  05/29/2014  FINDINGS: Mild cortical volume loss noted with proportional ventricular prominence. Areas of periventricular white matter hypodensity are most compatible with small vessel ischemic change. No acute hemorrhage, infarct, or mass lesion is identified. No midline shift. Mild ethmoid mucoperiosteal thickening. No skull fracture. Remote nasal bone deformity reidentified.  IMPRESSION: No acute intracranial finding.   Electronically Signed   By: Christiana PellantGretchen  Green M.D.   On: 07/13/2014 23:40   Dg Chest Port 1 View  07/13/2014   CLINICAL DATA:  Initial evaluation for shortness of breath.  EXAM: PORTABLE CHEST - 1 VIEW  COMPARISON:  Prior radiograph from 07/10/2014.  FINDINGS: Cardiomegaly is stable from prior study. Atherosclerotic calcifications noted within the aortic arch.  Lungs are mildly hypoinflated. Patchy and linear bibasilar opacities favored to reflect atelectasis, similar to prior. Mild diffuse bronchitic changes present. No focal infiltrates identified. No pulmonary edema or pleural effusion. No pneumothorax.  No acute osseus abnormality.  IMPRESSION: Shallow lung inflation with patchy and linear bibasilar atelectasis. Overall, the appearance of the chest is not significantly changed relative to 07/10/2014. No other active cardiopulmonary disease identified.   Electronically Signed   By: Rise MuBenjamin  McClintock M.D.   On: 07/13/2014 20:22   Dg Abd 2 Views  07/13/2014   CLINICAL DATA:  Shortness of breath with abdominal pain for 3 days.  EXAM: ABDOMEN - 2 VIEW  COMPARISON:  Radiographs 07/10/2014. Chest radiographs done today are correlated.  FINDINGS: Somewhat nodular reticular densities at both lung bases are similar to recent studies and likely exacerbated by lower lung volumes on  this study. The bowel gas pattern is normal. There is no free intraperitoneal air or bowel wall thickening. There are no  suspicious abdominal calcifications. There are degenerative and postsurgical changes throughout the lumbar spine.  IMPRESSION: No acute abdominal findings.  The bowel gas pattern remains normal.   Electronically Signed   By: Roxy Horseman M.D.   On: 07/13/2014 21:10      Assessment/Plan Active Problems:   Type 2 diabetes mellitus with vascular disease   Hypertension   Hyperlipidemia   Shortness of breath   1. Shortness of Breath -he has acute respiratory failure with hypercapnea -will start on BIPAP -will place in step down unit -titrate oxygen as needed -will place on nebulizers duonebs -I have also started him on antibiotics rocephin and vancocin -will repeat ABG  2. Hypertension -will continue with home medications -monitor pressures  3. Type 2 diabetes Mellitus -started on sliding scale -monitor FSBS -check A1C  4. Cellulitis of Lower extremities -appears to be more chronic wound but looks infected -started on vancocin and rocephin  5. Hyperlipidemia -continue with home medications  6. Acute renal failure -hold diuretics -will start on fluids   Code Status: Full Code (must indicate code status--if unknown or must be presumed, indicate so) DVT Prophylaxis:Heparin Family Communication: daughter (indicate person spoken with, if applicable, with phone number if by telephone) Disposition Plan: Home (indicate anticipated LOS)  Time spent:  [facility] Pager [phone]

## 2014-07-14 NOTE — Evaluation (Signed)
Physical Therapy Evaluation Patient Details Name: [name] MRN: [id number] DOB: 1933-05-29 Today's Date: 07/14/2014   History of Present Illness  Pt admitted with SOB, AMS after recent admit 05/27/14 with similar presentation. Pt on Bipap and now transitioned to [place].   Clinical Impression  Pt pleasant but with decreased safety awareness, mobility, balance and gait with cues and assist for all mobility to maintain balance. Pt incontinent of urine on arrival with total assist for pericare and linen change prior to mobility. Pt states he has equipment at home but doesn't use it despite education and falls. [facility] admission and is agreeable to do this again as unsure if family can assist during the day. Pt will benefit from acute therapy to maximize mobility, function, gait and safety to decrease fall risk and burden of care.     Follow Up Recommendations Supervision for mobility/OOB;SNF    Equipment Recommendations  Other (comment) (shower chair)    Recommendations for Other Services OT consult     Precautions / Restrictions Precautions Precautions: Fall      Mobility  Bed Mobility Overal bed mobility: Needs Assistance Bed Mobility: Supine to Sit     Supine to sit: Min assist     General bed mobility comments: HOB 20 degrees with min assist to fully elevate trunk from surface and scoot to EOB  Transfers Overall transfer level: Needs assistance   Transfers: Sit to/from Stand Sit to Stand: Min guard         General transfer comment: cues for hand placement with assist and guarding for safety  Ambulation/Gait Ambulation/Gait assistance: Min assist Ambulation Distance (Feet): 300 Feet Assistive device: Rolling walker (2 wheeled) Gait Pattern/deviations: Step-through pattern;Decreased stride length;Trunk flexed   Gait velocity interpretation: at or above normal speed for age/gender General Gait Details: pt with quick gait unsafe for pt  safety and mobility with max cues for posture, position in RW and safe speed  Stairs            Wheelchair Mobility    Modified Rankin (Stroke Patients Only)       Balance Overall balance assessment: Needs assistance   Sitting balance-Leahy Scale: Fair       Standing balance-Leahy Scale: Fair                               Pertinent Vitals/Pain Pain Assessment: No/denies pain  HR 61 sats 91-97% on 2L    Home Living Family/patient expects to be discharged to:: Private residence Living Arrangements: Children Available Help at Discharge: Available PRN/intermittently Type of Home: House Home Access: Stairs to enter Entrance Stairs-Rails: Right Entrance Stairs-Number of Steps: 3 Home Layout: One level Home Equipment: Walker - 2 wheels;Cane - single point Additional Comments: pt states he does not have BSC or shower chair which last admission    Prior Function Level of Independence: Needs assistance   Gait / Transfers Assistance Needed: pt states he doesn't use any of the equipment at home but has had several falls  ADL's / Homemaking Assistance Needed: kids do the cleaning and he eats out but doesn't cook  Comments: son stays at night, dgtr next door during the day     Hand Dominance        Extremity/Trunk Assessment   Upper Extremity Assessment: Overall WFL for tasks assessed           Lower Extremity Assessment: Generalized weakness  Cervical / Trunk Assessment: Kyphotic  Communication   Communication: No difficulties  Cognition Arousal/Alertness: Awake/alert Behavior During Therapy: WFL for tasks assessed/performed Overall Cognitive Status: Impaired/Different from baseline Area of Impairment: Safety/judgement                    General Comments      Exercises        Assessment/Plan    PT Assessment Patient needs continued PT services  PT Diagnosis Difficulty walking   PT Problem List Decreased  cognition;Decreased balance;Decreased mobility;Decreased safety awareness  PT Treatment Interventions Gait training;Stair training;Functional mobility training;Therapeutic activities;Therapeutic exercise;Balance training;Patient/family education;DME instruction   PT Goals (Current goals can be found in the Care Plan section) Acute Rehab PT Goals Patient Stated Goal: return home PT Goal Formulation: With patient Time For Goal Achievement: 07/28/14 Potential to Achieve Goals: Fair    Frequency Min 3X/week   Barriers to discharge Decreased caregiver support      Co-evaluation               End of Session Equipment Utilized During Treatment: Gait belt;Oxygen Activity Tolerance: Patient tolerated treatment well Patient left: in chair;with call bell/phone within reach;with family/visitor present Nurse Communication: Mobility status;Precautions         Time: [id number] PT Time Calculation (min) (ACUTE ONLY): 18 min   Charges:   PT Evaluation $Initial PT Evaluation Tier I: 1 Procedure PT Treatments $Gait Training: 8-22 mins   PT G CodesDelorse Lek:          Tabor, Jaidalyn Schillo Beth 07/14/2014, 11:06 AM Delaney MeigsMaija Tabor Lonia Roane, PT [phone]

## 2014-07-14 NOTE — Progress Notes (Signed)
UR Completed.  [phone]  

## 2014-07-14 NOTE — ED Notes (Signed)
CBG: 150 

## 2014-07-14 NOTE — Progress Notes (Signed)
[facility]  Khalik Pewitt OYD:[id number] DOB: 28-Sep-1932 DOA: 07/13/2014 PCP: [name], MD   Brief narrative: 80 year old WM PMHx anxiety, depression, noncompliance medication, OSA on CPAP, OHS, morbidly obese, MSSA pericarditis, HTN, TIA, MI, gout. patient last discharged from this facility on 11/13 after being treated for acute respiratory failure with hypoxia and hypercarbia. During that admission he was felt to be volume overloaded but was also found to have rhinovirus positive upper respiratory infection. During that admission he had transient bradycardia that was felt to be related to hypoxemia. Prior to discharge this had resolved and he was tolerating his beta blocker without bradycardia. He was discharged to a skilled nursing facility for rehabilitation and subsequently recovered enough to return to home. He has followed up with his pulmonologist since discharge to discuss his sleep apnea. Plans were to have his CPAP downloaded to determine if it is functioning appropriately and if patient is compliant (during previous hospitalization patient had been refusing CPAP). According to outpatient note from pulmonology patient reported improved compliance since last admission. Of note most recently primary care physician have been notified that during routine vital sign checks at home patient's pulse had been dipping to as low as in the 40s. Mention was made of a medication change but I am unable to locate in the records exactly which medication was changed.  He had been recently evaluated in the ER on 12/18 for complaints of abdominal pain and shortness of breath. He was found to have abnormal urinalysis and was treated for a a urinary tract infection and was given a prescription for Septra. He was not hypoxic at ambulation so was discharged. Of note subsequent urine culture was negative.   He has returned to the ER on 12/21 with complaints of  shortness of breath with ambulation. He was found to have sinus bradycardia with rates in the 50s. ABG revealed respiratory acidosis secondary to hypercarbia. He was placed on BiPAP. Chest x-ray was read as unremarkable. ProBNP was elevated at 814 noting patient has preserved LV function with mild to moderate LVH and moderate aortic stenosis. He did not have any leukocytosis or fever but was noted to have a new thrombocytopenia. He states his lower extremity edema is stable but he has noticed increased weeping and new redness of the skin.  HPI/Subjective: Alert and complaining of some shortness of breath and having difficulty moving in the bed. Daughter at bedside who reports she has concerns that the patient CPAP is 80 years old and may not be functioning appropriately. Patient has not had any chest pain or any palpitations.  Assessment/Plan: Active Problems:   Acute on chronic respiratory failure with hypoxia and hypercarbia:   A) HCAP (healthcare-associated pneumonia) -Chest x-ray has new ill-defined opacities in bilateral bases that could either be edema or pneumonia -As precaution will treat as infectious and adjust antibiotics accordingly -Procalcitonin <0.10 so less likely this is HCAP; consider dc anbx's  -No leukocytosis or fever presentation but this may have been blunted by recent use of Septra  Non AG Respiratory Acidosis  -Most likely multifactorial to Include OSA, OHS, and Drug Induced - See OSA and RLS. - Have dec Lyrica to 150mg  BID, Would titrate down further slowly over weeks     B) Aortic stenosis -Only moderate in nature but associated with LVH so may be a contributing factor to patient's recurrent heart failure -Continue to follow -Since suspect heart failure we will discontinue IV fluids  and instead begin Lasix IV 60 mg IV -Daily a.m. Weight; admission weight= ?? ,   12/22 weight= 113.4 kg -Strict in and out;     C) Obstructive sleep apnea, on CPAP/ OHS -likely  etiology to hypercarbic failure although edema can cause similar issues as can pneumonia -Family concerned that CPAP mask/machine malfunctioning so have asked case manager to explore -Stable off of BiPAP; continue nocturnal CPAP -No documented pulmonary hypertension or RV systolic dysfunction that would be cause of heart failure -On Lyrica likely for Neuropathic pain -200 mg BID> 300 mg per day gives no Inc benefit but Increases SE such as somnolence. Will decrease to 150 mg BID    Bradycardia, drug induced -Hemodynamically stable at rest -Heart rates in the 50s but QTC prolonged at 567 and likely directly related to slower rate -Have asked RN to hold beta blocker for now -Consult cardiology for input    Type 2 diabetes mellitus  -Current CBGs well-controlled -Continue moderate SSI-Diet controlled at home -Hemoglobin A1c 6.1 in July 2015, new hemoglobin A1c and lipid panel pending    CKD (chronic kidney disease), stage III -Current renal function appears to be around baseline -Average BUN in the 30s and creatinine ranges from 1.5 to 2.5    Thrombocytopenia -New since discharge and suspect related to recent Septra; has had transiently in past and presumed related to infectious processes -Septra was discontinued upon admission -Follow trends   Known CAD with prior PCI -Continue aspirin and Plavix -Beta blocker on hold as above -Last cath 2013    Dyslipidemia -Documented allergy to statins     Ileus -Patient noted to have ileus on 12/18 acute abdominal series, with continued abdominal pain to palpation. By abdominal x-ray 12/21 may have resolved however patient continues to have abdominal pain.  -Restart patient in a.m. on clear liquid diet and if tolerates advance slowly     Constipation -Retained stool primarily in right colon -Milk of magnesia 1 dose -Begin twice a day Colace and hour of sleep MiraLAX    Chronic venous insufficiency -Initially suspected patient may have  cellulitis of lower extremities but exam more consistent with stasis dermatitis    Hypertension -Medications as above    BPH -Continue Flomax -Use condom catheter for accurate urinary output    Restless leg syndrome -Continue current home medication regimen:  on Remeron and Myrbetriq   DVT prophylaxis: SCDs Code Status: Full Family Communication: Daughter at bedside Disposition Plan/Expected LOS: Stepdown   Consultants: Cardiology/Dr. Donnie Ahoilley  Procedures: 12/18 acute abdominal series;Mild gaseous distended small bowel loops in right abdomen probable mild ileus. No air-fluid levels.  12/21 PCXR :Shallow lung inflation with patchy and linear bibasilar atelectasis. 12/21 abdomen 2 view; Bowel gas pattern is normal.    Cultures: Blood cultures 2 pending Urine culture from 12/18 negative  Antibiotics: Rocephin 12/21 > 12/22 Vancomycin 12/21 > Maxipime 12/22 >  Objective: Blood pressure 126/44, pulse 61, temperature 96.7 F (35.9 C), temperature source Oral, resp. rate 28, weight 250 lb (113.4 kg), SpO2 91 %.  Intake/Output Summary (Last 24 hours) at 07/14/14 1116 Last data filed at 07/14/14 [id number]  Gross per 24 hour  Intake    718 ml  Output      0 ml  Net    718 ml     Exam: Follow-up exam completed. Patient admitted at 12:15 AM today  Scheduled Meds:  Scheduled Meds: . allopurinol  300 mg Oral q morning - 10a  . aspirin EC  81  mg Oral Daily  . buPROPion  150 mg Oral q morning - 10a  . ceFEPime (MAXIPIME) IV  2 g Intravenous Q24H  . clopidogrel  75 mg Oral Daily  . docusate sodium  100 mg Oral BID  . ferrous gluconate  324 mg Oral Daily  . folic acid  1 mg Oral Daily  . furosemide  60 mg Intravenous Q12H  . insulin aspart  0-15 Units Subcutaneous TID WC  . ipratropium-albuterol  3 mL Nebulization TID  . metoprolol tartrate  25 mg Oral BID  . mirabegron ER  50 mg Oral Daily  . mirtazapine  15 mg Oral QHS  . multivitamin with minerals  1 tablet Oral Daily   . pantoprazole  40 mg Oral BID  . polyethylene glycol  17 g Oral QHS  . pregabalin  200 mg Oral BID  . rOPINIRole  4 mg Oral QHS  . sodium chloride  3 mL Intravenous Q12H  . tamsulosin  0.4 mg Oral Daily  . thiamine  100 mg Oral Daily  . [START ON 07/15/2014] vancomycin  1,250 mg Intravenous Q24H   Continuous Infusions:   Data Reviewed: Basic Metabolic Panel:  Recent Labs Lab 07/10/14 0801 07/13/14 1928 07/14/14 0133  NA 144 143 140  K 5.0 4.9 4.5  CL 102 101 103  CO2 32 32 32  GLUCOSE 102* 106* 167*  BUN 31* 35* 32*  CREATININE 1.28 1.78* 1.99*  CALCIUM 9.3 8.8 8.5   Liver Function Tests:  Recent Labs Lab 07/10/14 0801 07/13/14 1928 07/14/14 0133  AST 27 24 23   ALT 18 25 25   ALKPHOS 114 111 95  BILITOT 0.4 0.3 0.4  PROT 8.1 7.5 6.8  ALBUMIN 3.8 3.6 3.5    Recent Labs Lab 07/10/14 0801  LIPASE 45   No results for input(s): AMMONIA in the last 168 hours. CBC:  Recent Labs Lab 07/10/14 0801 07/13/14 1928 07/14/14 0133  WBC 5.7 5.3 5.6  NEUTROABS 3.2 3.1  --   HGB 15.3 13.5 12.8*  HCT 47.7 43.7 42.5  MCV 94.1 93.4 93.6  PLT 112* 109* 106*   Cardiac Enzymes:  Recent Labs Lab 07/13/14 1928 07/14/14 0132 07/14/14 0740  TROPONINI <0.30 <0.03 <0.03   BNP (last 3 results)  Recent Labs  05/28/14 1508 07/10/14 0801 07/13/14 1928  PROBNP 477.3* 422.7 814.6*   CBG:  Recent Labs Lab 07/14/14 0200  GLUCAP 150*    Recent Results (from the past 240 hour(s))  Urine culture     Status: None   Collection Time: 07/10/14 10:25 AM  Result Value Ref Range Status   Specimen Description URINE, RANDOM  Final   Special Requests NONE  Final   Culture  Setup Time   Final    07/10/2014 21:22 Performed at [facility] Count   Final    35,000 COLONIES/ML Performed at [facility]    Culture   Final    Multiple bacterial morphotypes present, none predominant. Suggest appropriate recollection if clinically  indicated. Performed at [facility]    Report Status 07/11/2014 FINAL  Final  MRSA PCR Screening     Status: None   Collection Time: 07/14/14  6:01 AM  Result Value Ref Range Status   MRSA by PCR NEGATIVE NEGATIVE Final    Comment:        The GeneXpert MRSA Assay (FDA approved for NASAL specimens only), is one component of a comprehensive MRSA colonization surveillance program. It  is not intended to diagnose MRSA infection nor to guide or monitor treatment for MRSA infections.      Studies:  Recent x-ray studies have been reviewed in detail by the Attending Physician  Time spent :      Junious Silk, ANP [facility] Office  [phone] Pager [phone]   **If unable to reach the above provider after paging please contact the Flow Manager @ [phone]  On-Call/Text Page:      [email or web address]      password [email or web address]  If 7PM-7AM, please contact night-coverage [email or web address] Password [email or web address] 07/14/2014, 11:16 AM   LOS: 1 day   Examined Patient and discussed A&P with ANP Revonda Standard and agree with above plan.  I have reviewed the entire database. I have made any necessary editorial changes, and agree with its content. Pt with Multiple Complex medical problems> 50 min spent in direct Pt care    Carolyne Littles, MD [facility] [phone] pager

## 2014-07-14 NOTE — Progress Notes (Addendum)
ANTIBIOTIC CONSULT NOTE - INITIAL  Pharmacy Consult for vancomycin Indication: cellulitis and SOB  Allergies  Allergen Reactions  . Statins Other (See Comments)    Severe leg myalgias, weakness    Patient Measurements: Weight: 113.4kg  Vital Signs: Temp: 97.7 F (36.5 C) (12/21 2323) Temp Source: Oral (12/21 2323) BP: 122/49 mmHg (12/22 0530) Pulse Rate: 49 (12/22 0530)  Labs:  Recent Labs  07/13/14 1928 07/14/14 0133  WBC 5.3 5.6  HGB 13.5 12.8*  PLT 109* 106*  CREATININE 1.78* 1.99*   Estimated Creatinine Clearance: 37.4 mL/min (by C-G formula based on Cr of 1.99).    Microbiology: Recent Results (from the past 720 hour(s))  Urine culture     Status: None   Collection Time: 07/10/14 10:25 AM  Result Value Ref Range Status   Specimen Description URINE, RANDOM  Final   Special Requests NONE  Final   Culture  Setup Time   Final    07/10/2014 21:22 Performed at [facility] Count   Final    35,000 COLONIES/ML Performed at [facility]    Culture   Final    Multiple bacterial morphotypes present, none predominant. Suggest appropriate recollection if clinically indicated. Performed at [facility]    Report Status 07/11/2014 FINAL  Final    Medical History: Past Medical History  Diagnosis Date  . Gout, unspecified     severe dz, "treatment done that last 15 years"  . OBESITY   . RESTLESS LEG SYNDROME   . DETACHMENT, RETINAL NOS 1994  . CATARACT NOS 2008  . HYPERTENSION   . CORONARY ARTERY DISEASE   . PERICARDITIS, ACUTE NEC 2007    MSSA, s/p pericardial window  . GERD   . DEGENERATIVE JOINT DISEASE   . CAROTID ENDARTERECTOMY, LEFT, HX OF   . OBSTRUCTIVE SLEEP APNEA     CPAP qhs  . TIA (transient ischemic attack) 2005  . BPH (benign prostatic hypertrophy)     "minor"  . Lumbar spinal stenosis   . Carotid artery occlusion   . Unspecified venous (peripheral) insufficiency   . Stroke May 2003  . MYOCARDIAL  INFARCTION 2004, 03/2010    "minor"  . Anxiety   . Depression   . Shortness of breath      Assessment: 80yo male c/o AMS and SOB w/ minimal exertion, no clear sign of infection, also w/ chronic wound of LE that appears on exam to be infected, to begin IV ABX.  Goal of Therapy:  Vancomycin trough level 15-20 mcg/ml (will decrease if no infection other than cellulitis)  Plan:  Will give vancomycin 2000mg  IV x1 followed by 1250mg  IV Q24H and monitor CBC, Cx, levels prn.  Vernard Gambles, PharmD, BCPS  07/14/2014,5:55 AM  Addendum: Changing ceftriaxone to cefepime.  Plan: 1. Cefepime 2gm IV Q24H 2. F/u renal fxn, C&S, clinical status  Lysle Pearl, PharmD, BCPS Pager # [phone] 07/14/2014 9:24 AM

## 2014-07-15 ENCOUNTER — Institutional Professional Consult (permissible substitution): Payer: Medicare Other | Admitting: Internal Medicine

## 2014-07-15 ENCOUNTER — Telehealth: Payer: Self-pay | Admitting: Pulmonary Disease

## 2014-07-15 DIAGNOSIS — T50905A Adverse effect of unspecified drugs, medicaments and biological substances, initial encounter: Secondary | ICD-10-CM

## 2014-07-15 LAB — CBC WITH DIFFERENTIAL/PLATELET
BASOS ABS: 0 10*3/uL (ref 0.0–0.1)
Basophils Relative: 0 % (ref 0–1)
Eosinophils Absolute: 0.2 10*3/uL (ref 0.0–0.7)
Eosinophils Relative: 3 % (ref 0–5)
HCT: 46.4 % (ref 39.0–52.0)
Hemoglobin: 14.2 g/dL (ref 13.0–17.0)
LYMPHS PCT: 13 % (ref 12–46)
Lymphs Abs: 1 10*3/uL (ref 0.7–4.0)
MCH: 29.3 pg (ref 26.0–34.0)
MCHC: 30.6 g/dL (ref 30.0–36.0)
MCV: 95.7 fL (ref 78.0–100.0)
Monocytes Absolute: 0.7 10*3/uL (ref 0.1–1.0)
Monocytes Relative: 9 % (ref 3–12)
NEUTROS PCT: 74 % (ref 43–77)
Neutro Abs: 5.6 10*3/uL (ref 1.7–7.7)
PLATELETS: 107 10*3/uL — AB (ref 150–400)
RBC: 4.85 MIL/uL (ref 4.22–5.81)
RDW: 16.2 % — AB (ref 11.5–15.5)
WBC: 7.5 10*3/uL (ref 4.0–10.5)

## 2014-07-15 LAB — COMPREHENSIVE METABOLIC PANEL
ALT: 23 U/L (ref 0–53)
ANION GAP: 6 (ref 5–15)
AST: 21 U/L (ref 0–37)
Albumin: 3.7 g/dL (ref 3.5–5.2)
Alkaline Phosphatase: 103 U/L (ref 39–117)
BUN: 29 mg/dL — AB (ref 6–23)
CALCIUM: 9.1 mg/dL (ref 8.4–10.5)
CO2: 36 mmol/L — AB (ref 19–32)
Chloride: 96 mEq/L (ref 96–112)
Creatinine, Ser: 1.72 mg/dL — ABNORMAL HIGH (ref 0.50–1.35)
GFR, EST AFRICAN AMERICAN: 42 mL/min — AB (ref >90)
GFR, EST NON AFRICAN AMERICAN: 36 mL/min — AB (ref >90)
GLUCOSE: 125 mg/dL — AB (ref 70–99)
Potassium: 4.5 mmol/L (ref 3.5–5.1)
Sodium: 138 mmol/L (ref 135–145)
Total Bilirubin: 0.8 mg/dL (ref 0.3–1.2)
Total Protein: 7.5 g/dL (ref 6.0–8.3)

## 2014-07-15 LAB — URIC ACID: Uric Acid, Serum: 5.8 mg/dL (ref 4.0–7.8)

## 2014-07-15 LAB — LIPID PANEL
Cholesterol: 190 mg/dL (ref 0–200)
HDL: 32 mg/dL — AB (ref >39)
LDL Cholesterol: 128 mg/dL — ABNORMAL HIGH (ref 0–99)
TRIGLYCERIDES: 149 mg/dL (ref <150)
Total CHOL/HDL Ratio: 5.9 RATIO
VLDL: 30 mg/dL (ref 0–40)

## 2014-07-15 LAB — GLUCOSE, CAPILLARY
GLUCOSE-CAPILLARY: 120 mg/dL — AB (ref 70–99)
GLUCOSE-CAPILLARY: 119 mg/dL — AB (ref 70–99)
GLUCOSE-CAPILLARY: 173 mg/dL — AB (ref 70–99)
Glucose-Capillary: 127 mg/dL — ABNORMAL HIGH (ref 70–99)

## 2014-07-15 LAB — HEMOGLOBIN A1C
Hgb A1c MFr Bld: 6.2 % — ABNORMAL HIGH (ref <5.7)
MEAN PLASMA GLUCOSE: 131 mg/dL — AB (ref <117)

## 2014-07-15 LAB — MAGNESIUM: MAGNESIUM: 2.3 mg/dL (ref 1.5–2.5)

## 2014-07-15 LAB — LACTIC ACID, PLASMA: LACTIC ACID, VENOUS: 1.1 mmol/L (ref 0.5–2.2)

## 2014-07-15 MED ORDER — HEPARIN SODIUM (PORCINE) 5000 UNIT/ML IJ SOLN
5000.0000 [IU] | Freq: Three times a day (TID) | INTRAMUSCULAR | Status: DC
  Administered 2014-07-15 – 2014-07-16 (×3): 5000 [IU] via SUBCUTANEOUS
  Filled 2014-07-15 (×4): qty 1

## 2014-07-15 MED ORDER — POLYETHYLENE GLYCOL 3350 17 G PO PACK
17.0000 g | PACK | Freq: Two times a day (BID) | ORAL | Status: DC
  Administered 2014-07-15: 17 g via ORAL
  Filled 2014-07-15 (×3): qty 1

## 2014-07-15 MED ORDER — MAGNESIUM 250 MG PO TABS
250.0000 mg | ORAL_TABLET | Freq: Every day | ORAL | Status: DC
Start: 2014-07-15 — End: 2014-07-15

## 2014-07-15 MED ORDER — ALBUTEROL SULFATE (2.5 MG/3ML) 0.083% IN NEBU: 2.5000 mg | INHALATION_SOLUTION | RESPIRATORY_TRACT | Status: DC | PRN

## 2014-07-15 MED ORDER — MIRTAZAPINE 7.5 MG PO TABS
7.5000 mg | ORAL_TABLET | Freq: Every day | ORAL | Status: DC
  Administered 2014-07-15: 7.5 mg via ORAL
  Filled 2014-07-15 (×2): qty 1

## 2014-07-15 MED ORDER — MAGNESIUM CITRATE PO SOLN
1.0000 | Freq: Once | ORAL | Status: AC
  Administered 2014-07-15: 1 via ORAL
  Filled 2014-07-15: qty 296

## 2014-07-15 NOTE — Progress Notes (Signed)
Subjective:  Good bit of urine output last night.  Despite this weight are highly inaccurate.  Objective:  Vital Signs in the last 24 hours: BP 136/53 mmHg  Pulse 76  Temp(Src) 97.7 F (36.5 C) (Axillary)  Resp 18  Ht 5\' 9"  (1.753 m)  Wt 115.5 kg (254 lb 10.1 oz)  BMI 37.59 kg/m2  SpO2 98%  Physical Exam: Obese WM in NAD Lungs: mild wheeze Cardiac:  Regular rhythm, normal S1 and S2, no S3, 2/6 murmur of AS Abdomen:  Soft, nontender, no masses Extremities: 1+ edema present  Intake/Output from previous day: 12/22 0701 - 12/23 0700 In: 793 [P.O.:740; I.V.:3; IV Piggyback:50] Out: 5625 [Urine:5625]   Weight Filed Weights   07/14/14 0800 07/14/14 2100 07/15/14 0500  Weight: 113.4 kg (250 lb) 116.2 kg (256 lb 2.8 oz) 115.5 kg (254 lb 10.1 oz)    Lab Results: Basic Metabolic Panel:  Recent Labs  33/38/32 0133 07/15/14 0325  NA 140 138  K 4.5 4.5  CL 103 96  CO2 32 36*  GLUCOSE 167* 125*  BUN 32* 29*  CREATININE 1.99* 1.72*    CBC:  Recent Labs  07/13/14 1928 07/14/14 0133 07/15/14 0333  WBC 5.3 5.6 7.5  NEUTROABS 3.1  --  5.6  HGB 13.5 12.8* 14.2  HCT 43.7 42.5 46.4  MCV 93.4 93.6 95.7  PLT 109* 106* 107*    BNP    Component Value Date/Time   PROBNP 814.6* 07/13/2014 1928    PROTIME: Lab Results  Component Value Date   INR 1.07 05/29/2014   INR 0.96 04/15/2010   INR 0.9 09/25/2007    Telemetry: Sinus rhythm  Assessment/Plan:  1. Acute hypercarbic respiratory failure 2. Acute on chronic diastolic CHF 3. Stage 3 CKD 4. CAD 5. Moderate AS previously  Rec:  I requested pulmonary to see yesterday, still has not seen. Continue diuresis.  Best I can tell prior weight was 237 pounds post diuresis.    Darden Palmer  MD [facility]  07/15/2014, 8:57 AM

## 2014-07-15 NOTE — Telephone Encounter (Signed)
Spoke with Orpha Bur, after speaking to Benedict and Robynn Pane let Orpha Bur know that we do not have a download on pt and he is not enrolled in [facility].  She wished to schedule a hospital follow up, which was scheduled with TP on 07/31/14 @2 :00.  Spoke with Angie at [facility] to request a download be faxed on pt's cpap.    Nothing further needed at this time.

## 2014-07-15 NOTE — Progress Notes (Addendum)
**[name] De-Identified via Obfuscation** Mason [name]  Mason [name] IWP:[id number] DOB: 11-Jun-1933 DOA: 07/13/2014 PCP: [name], MD  Brief narrative: 80 year old M Hx anxiety, depression, noncompliance w/ medications, OSA on CPAP, OHS, morbidly obese, MSSA pericarditis, HTN, TIA, MI, and gout who was discharged from this facility on 11/13 after being treated for acute respiratory failure with hypoxia and hypercarbia. During that admission he was felt to be volume overloaded but was also found to have rhinovirus positive upper respiratory infection. During that admission he had transient bradycardia that was felt to be related to hypoxemia. Prior to discharge this had resolved and he was tolerating his beta blocker without bradycardia. He was discharged to a skilled nursing facility for rehabilitation and subsequently recovered enough to return to home. He has followed up with his pulmonologist since discharge to discuss his sleep apnea. Plans were to have his CPAP downloaded to determine if it is functioning appropriately and if patient is compliant (during previous hospitalization patient had been refusing CPAP). According to outpatient [name] from pulmonology patient reported improved compliance since last admission. Of [name] most recently primary care physician have been notified that during routine vital sign checks at home patient's pulse had been dipping to as low as in the 40s. Mention was made of a medication change but I am unable to locate in the records exactly which medication was changed.  He had been evaluated in the ER on 12/18 for complaints of abdominal pain and shortness of breath. He was found to have abnormal urinalysis and was treated for a a urinary tract infection and was given a prescription for Septra. He was not hypoxic at ambulation so was discharged. Of [name] subsequent urine culture was negative.   He returned to the ER on 12/21 with complaints of shortness of breath with  ambulation. He was found to have sinus bradycardia with rates in the 50s. ABG revealed respiratory acidosis secondary to hypercarbia. He was placed on BiPAP. Chest x-ray was unremarkable. He did not have any leukocytosis or fever but was noted to have a new thrombocytopenia. He stated his lower extremity edema is stable but he has noticed increased weeping and new redness of the skin.  HPI/Subjective: Pt states he has still not yet had a significant bowel movement.  Admits to being noncompliant w/ CPAP last night.  Denies current cp.  States sob has improved but is not yet back to baseline.    Assessment/Plan:  Multifactorial acute on chronic respiratory failure with hypoxia and hypercarbia -Chest x-ray has new ill-defined opacities in bilateral bases that could either be edema or pneumonia -Procalcitonin <0.10 and no other clinical s/sx to suggest pneumonia; dc anbx's   Respiratory Acidosis  -multifactorial to Include OSA, OHS, and Drug Induced -See OSA -dec Lyrica to 150mg  BID - titrate down further slowly over weeks   Diastolic CHF w/ acute exacerbation - Mod AoS -Only moderate AoS -Lasix IV 60 mg IV -admit weight 113.4kg - no weight recorded today - review of records notes wgt has been as low as 106.4kg in Nov 2015 - cont to diurese as renal fxn allows  -net negative ~4L thus far this admit   Obstructive sleep apnea, on CPAP/ OHS -likely etiology to hypercarbic failure  -Family concerned that CPAP mask/machine malfunctioning so have asked case manager to explore -Stable off daily BiPAP; continue nocturnal CPAP -No documented pulmonary hypertension or RV systolic dysfunction that would be cause of heart failure -On Lyrica likely for Neuropathic  pain - 200 mg BID - of [name] > 300 mg per day gives no inc benefit but increases side effects such as somnolence - decrease to 150 mg BID for now, w/ plan to drop further as able   Bradycardia, drug induced -Hemodynamically stable at  rest -Heart rates in the 50s but QTC prolonged at 567  -hold beta blocker  -Cardiology following   Type 2 diabetes mellitus  -Current CBGs well-controlled -A1c pending  Chronic kidney disease stage III -Current renal function appears to be around baseline -Average BUN in the 30s and creatinine ranges from 1.5 to 2.5  Thrombocytopenia -New since discharge and suspect related to recent Septra; has had transiently in past and presumed related to infectious processes -Septra discontinued upon admission -Follow trends w/ resumption of SQ heparin   Known CAD with prior PCI -Continue aspirin and Plavix -Beta blocker on hold as above -Last cath 2013  Dyslipidemia -Documented allergy to statins  Ileus / Constipation  -Patient noted to have ileus on 12/18 acute abdominal series, with continued abdominal pain to palpation - by abdominal x-ray 12/21 may have resolved however patient continued to have constipation - advance bowel tx   Chronic venous insufficiency -Initially suspected patient may have cellulitis of lower extremities but exam more consistent with stasis dermatitis  Hypertension -Medications as above  BPH -Continue Flomax -Use condom catheter for accurate urinary output  Restless leg syndrome -Continue current home medication regimen:  on Remeron and Myrbetriq  DVT prophylaxis: SCDs + SQ heparin  Code Status: Full Family Communication: no family present at time of exam  Disposition Plan/Expected LOS: Stable for transfer to CHF floor - cont to work w/ nightly CPAP as per home regimen - cont to diuresis to proposed dry weight of ~106kg - stimulate bowels   Consultants: Cardiology Pulmonary   Procedures: 12/18 acute abdominal series;Mild gaseous distended small bowel loops in right abdomen probable mild ileus. No air-fluid levels.  12/21 PCXR :Shallow lung inflation with patchy and linear bibasilar atelectasis. 12/21 abdomen 2 view; Bowel gas pattern is normal.    Antibiotics: Rocephin 12/21 > 12/22 Vancomycin 12/21 > 12/22 Maxipime 12/22 > 12/23  Objective: Blood pressure 136/53, pulse 76, temperature 97.7 F (36.5 C), temperature source Axillary, resp. rate 18, height 5\' 9"  (1.753 m), weight 115.5 kg (254 lb 10.1 oz), SpO2 98 %.  Intake/Output Summary (Last 24 hours) at 07/15/14 1046 Last data filed at 07/15/14 0700  Gross per 24 hour  Intake    170 ml  Output   5225 ml  Net  -5055 ml   Exam: General: No acute respiratory distress when laying in bed - must pause between sentences however Lungs: distant breath sounds - no appreciable crackles or wheeze  Cardiovascular: distant hs - 3/6 holosystolic M - no rub  Abdomen: Nontender, protuberant but soft, bowel sounds positive, no rebound, no ascites, no appreciable mass Extremities: No significant cyanosis, clubbing; trace edema bilateral lower extremities   Scheduled Meds:  Scheduled Meds: . allopurinol  300 mg Oral q morning - 10a  . antiseptic oral rinse  7 mL Mouth Rinse BID  . aspirin EC  81 mg Oral Daily  . buPROPion  150 mg Oral q morning - 10a  . ceFEPime (MAXIPIME) IV  2 g Intravenous Q24H  . clopidogrel  75 mg Oral Daily  . docusate sodium  100 mg Oral BID  . ferrous gluconate  324 mg Oral Daily  . folic acid  1 mg Oral Daily  .  furosemide  60 mg Intravenous Q12H  . insulin aspart  0-15 Units Subcutaneous TID WC  . ipratropium-albuterol  3 mL Nebulization TID  . mirabegron ER  50 mg Oral Daily  . mirtazapine  15 mg Oral QHS  . multivitamin with minerals  1 tablet Oral Daily  . pantoprazole  40 mg Oral BID  . polyethylene glycol  17 g Oral QHS  . pregabalin  150 mg Oral BID  . rOPINIRole  4 mg Oral QHS  . sodium chloride  3 mL Intravenous Q12H  . tamsulosin  0.4 mg Oral Daily  . thiamine  100 mg Oral Daily  . vancomycin  1,250 mg Intravenous Q24H   Data Reviewed: Basic Metabolic Panel:  Recent Labs Lab 07/10/14 0801 07/13/14 1928 07/14/14 0133 07/15/14 0325  07/15/14 0333  NA 144 143 140 138  --   K 5.0 4.9 4.5 4.5  --   CL 102 101 103 96  --   CO2 32 32 32 36*  --   GLUCOSE 102* 106* 167* 125*  --   BUN 31* 35* 32* 29*  --   CREATININE 1.28 1.78* 1.99* 1.72*  --   CALCIUM 9.3 8.8 8.5 9.1  --   MG  --   --   --   --  2.3   Liver Function Tests:  Recent Labs Lab 07/10/14 0801 07/13/14 1928 07/14/14 0133 07/15/14 0325  AST 27 24 23 21   ALT 18 25 25 23   ALKPHOS 114 111 95 103  BILITOT 0.4 0.3 0.4 0.8  PROT 8.1 7.5 6.8 7.5  ALBUMIN 3.8 3.6 3.5 3.7    Recent Labs Lab 07/10/14 0801  LIPASE 45   CBC:  Recent Labs Lab 07/10/14 0801 07/13/14 1928 07/14/14 0133 07/15/14 0333  WBC 5.7 5.3 5.6 7.5  NEUTROABS 3.2 3.1  --  5.6  HGB 15.3 13.5 12.8* 14.2  HCT 47.7 43.7 42.5 46.4  MCV 94.1 93.4 93.6 95.7  PLT 112* 109* 106* 107*   Cardiac Enzymes:  Recent Labs Lab 07/13/14 1928 07/14/14 0132 07/14/14 0740 07/14/14 1248  TROPONINI <0.30 <0.03 <0.03 <0.03   BNP (last 3 results)  Recent Labs  05/28/14 1508 07/10/14 0801 07/13/14 1928  PROBNP 477.3* 422.7 814.6*   CBG:  Recent Labs Lab 07/14/14 0200 07/14/14 1247 07/14/14 1538 07/14/14 2040  GLUCAP 150* 99 141* 124*    Recent Results (from the past 240 hour(s))  Urine culture     Status: None   Collection Time: 07/10/14 10:25 AM  Result Value Ref Range Status   Specimen Description URINE, RANDOM  Final   Special Requests NONE  Final   Culture  Setup Time   Final    07/10/2014 21:22 Performed at [facility] Count   Final    35,000 COLONIES/ML Performed at [facility]    Culture   Final    Multiple bacterial morphotypes present, none predominant. Suggest appropriate recollection if clinically indicated. Performed at [facility]    Report Status 07/11/2014 FINAL  Final  MRSA PCR Screening     Status: None   Collection Time: 07/14/14  6:01 AM  Result Value Ref Range Status   MRSA by PCR NEGATIVE NEGATIVE Final     Comment:        The GeneXpert MRSA Assay (FDA approved for NASAL specimens only), is one component of a comprehensive MRSA colonization surveillance program. It is not intended to diagnose MRSA infection nor  to guide or monitor treatment for MRSA infections.   Culture, blood (routine x 2)     Status: None (Preliminary result)   Collection Time: 07/14/14  7:40 AM  Result Value Ref Range Status   Specimen Description BLOOD RIGHT ANTECUBITAL  Final   Special Requests BOTTLES DRAWN AEROBIC ONLY 5CC  Final   Culture  Setup Time   Final    07/14/2014 15:08 [facility]    Culture   Final           BLOOD CULTURE RECEIVED NO GROWTH TO DATE CULTURE WILL BE HELD FOR 5 DAYS BEFORE ISSUING A FINAL NEGATIVE REPORT [facility]formed at [facility]    Report Status PENDING  Incomplete  Culture, blood (routine x 2)     Status: None (Preliminary result)   Collection Time: 07/14/14  7:45 AM  Result Value Ref Range Status   Specimen Description BLOOD RIGHT HAND  Final   Special Requests BOTTLES DRAWN AEROBIC ONLY 10CC  Final   Culture  Setup Time   Final    07/14/2014 15:08 Performed at [facility]    Culture   Final           BLOOD CULTURE RECEIVED NO GROWTH TO DATE CULTURE WILL BE HELD FOR 5 DAYS BEFORE ISSUING A FINAL NEGATIVE REPORT Performed at [facility]    Report Status PENDING  Incomplete     Studies:  Recent x-ray studies have been reviewed in detail by the Attending Physician  Time spent : 35 mins  Lonia BloodJeffrey T. Jamori Biggar, MD [facility] For Consults/Admissions - Flow Manager - [phone] Office  [phone] Pager [phone]  On-Call/Text Page:      [email or web address]      password [email or web address]  07/15/2014, 10:46 AM   LOS: 2 days

## 2014-07-15 NOTE — Progress Notes (Signed)
Report called to 3E

## 2014-07-15 NOTE — Evaluation (Signed)
Occupational Therapy Evaluation Patient Details Name: [name] MRN: [id number] DOB: [birth year] Today's Date: 07/15/2014    History of Present Illness Pt admitted with SOB, AMS after recent admit 05/27/14 with similar presentation. Pt on Bipap and now transitioned to [place].    Clinical Impression   Pt was functioning at home with the support of he family for meals and housekeeping.  He demonstrates decreased activity tolerance, safety awareness, and balance with longstanding joint limitations/deformities of his hands interfering with ability to perform ADL and ADL transfers.  Will follow acutely.  Recommending SNF for ST rehab.    Follow Up Recommendations  SNF;Supervision/Assistance - 24 hour (Pt has been to [facility] in the past with good outcome.)    Equipment Recommendations  None recommended by OT    Recommendations for Other Services       Precautions / Restrictions Precautions Precautions: Fall      Mobility Bed Mobility   Bed Mobility: Supine to Sit     Supine to sit: Min assist        Transfers Overall transfer level: Needs assistance Equipment used: Rolling walker (2 wheeled) Transfers: Stand Pivot Transfers;Sit to/from Stand Sit to Stand: Min guard Stand pivot transfers: Min assist       General transfer comment: transfer to w/c for transfer to another unit    Balance     Sitting balance-Leahy Scale: Fair       Standing balance-Leahy Scale: Fair                              ADL Overall ADL's : Needs assistance/impaired Eating/Feeding: Minimal assistance;Sitting Eating/Feeding Details (indicate cue type and reason): assist to open packages and cut food, pt able to manage a regular utensil Grooming: Wash/dry hands;Wash/dry face;Set up;Sitting   Upper Body Bathing: Minimal assitance;Sitting Upper Body Bathing Details (indicate cue type and reason): assist for back Lower Body Bathing: Minimal assistance;Sit to/from stand   Upper  Body Dressing : Minimal assistance;Sitting   Lower Body Dressing: Minimal assistance;Sit to/from stand Lower Body Dressing Details (indicate cue type and reason): able to cross foot over opposite knee to access feet  Toilet Transfer: Minimal assistance;Ambulation;BSC;RW           Functional mobility during ADLs: Minimal assistance;Rolling walker General ADL Comments: Educated pt in availability of elastic shoe laces and button aide.  Recommended pt use tub transfer bench for safety and energy conservation.  Instructed pt in how to place shower curtain to prevent water on the floor.     Vision                     Perception     Praxis      Pertinent Vitals/Pain Pain Assessment: No/denies pain     Hand Dominance Right   Extremity/Trunk Assessment Upper Extremity Assessment Upper Extremity Assessment: RUE deficits/detail;LUE deficits/detail RUE Deficits / Details: longstanding limitations in joints of hand RUE Coordination: decreased fine motor LUE Deficits / Details: longstanding joint limitations in hand LUE Coordination: decreased fine motor       Cervical / Trunk Assessment Cervical / Trunk Assessment: Kyphotic   Communication Communication Communication: No difficulties   Cognition Arousal/Alertness: Awake/alert Behavior During Therapy: WFL for tasks assessed/performed Overall Cognitive Status: Impaired/Different from baseline Area of Impairment: Safety/judgement         Safety/Judgement: Decreased awareness of safety;Decreased awareness of deficits         General Comments  Exercises       Shoulder Instructions      Home Living Family/patient expects to be discharged to:: Private residence Living Arrangements: Alone Available Help at Discharge: Family;Available PRN/intermittently (son lives in his backyard per pt, daughter next door) Type of Home: House Home Access: Stairs to enter Secretary/administratorntrance Stairs-Number of Steps: 3 Entrance  Stairs-Rails: Right Home Layout: One level     Bathroom Shower/Tub: Tub/shower unit Shower/tub characteristics: Curtain FirefighterBathroom Toilet: Standard     Home Equipment: Environmental consultantWalker - 2 wheels;Cane - single point;Hospital bed;Bedside commode;Tub bench (lift chair)   Additional Comments: pt states equipment was left over from his wife       Prior Functioning/Environment Level of Independence: Needs assistance  Gait / Transfers Assistance Needed: pt states he doesn't use any of the equipment at home but has had several falls ADL's / Homemaking Assistance Needed: kids do the cleaning and he eats out but doesn't cook, difficulty managing buttons, opening containers and jars and writitng due to joint limitations in hands   Comments: son stays at night, dgtr next door during the day    OT Diagnosis: Generalized weakness;Cognitive deficits   OT Problem List: Decreased activity tolerance;Impaired balance (sitting and/or standing);Decreased knowledge of use of DME or AE;Decreased safety awareness;Obesity;Impaired UE functional use   OT Treatment/Interventions: Self-care/ADL training;DME and/or AE instruction;Energy conservation;Therapeutic activities;Patient/family education    OT Goals(Current goals can be found in the care plan section) Acute Rehab OT Goals Patient Stated Goal: return home OT Goal Formulation: With patient Time For Goal Achievement: 07/29/14 Potential to Achieve Goals: Good ADL Goals Pt Will Perform Grooming: with modified independence;standing (2 activities) Pt Will Perform Lower Body Dressing: with modified independence;sit to/from stand Pt Will Transfer to Toilet: with modified independence;ambulating;bedside commode (over toilet) Pt Will Perform Toileting - Clothing Manipulation and hygiene: with modified independence;sit to/from stand Pt Will Perform Tub/Shower Transfer: with modified independence;ambulating;tub bench;rolling walker Additional ADL Goal #1: Pt will state  at least 3 energy conservation strategies and pursed lip breathing techniques. Additional ADL Goal #2: Pt will be aware of AE for buttoning, opening containers, writing.  OT Frequency: Min 2X/week   Barriers to D/C:            Co-evaluation              End of Session Equipment Utilized During Treatment: Oxygen;Rolling walker  Activity Tolerance: Patient limited by fatigue Patient left: Other (comment) (in w/c for transfer)   Time: [id number] OT Time Calculation (min): 23 min Charges:  OT General Charges $OT Visit: 1 Procedure OT Evaluation $Initial OT Evaluation Tier I: 1 Procedure OT Treatments $Self Care/Home Management : 8-22 mins G-Codes:    Evern BioMayberry, Keyon Liller Lynn 07/15/2014, 3:26 PM  [phone]

## 2014-07-15 NOTE — Progress Notes (Signed)
Pt requesting food for dinner and not c/o abdominal pain.  MD notified, will continue to monitor.

## 2014-07-15 NOTE — Progress Notes (Addendum)
Pt not ready to go on CPAP at this time, RT will place on later in the evening when pt is ready.   Pt stated he did not want to go on CPAP, and just wanted to wear [place] tonight for bed. RT will continue to monitor

## 2014-07-16 DIAGNOSIS — J9601 Acute respiratory failure with hypoxia: Secondary | ICD-10-CM

## 2014-07-16 DIAGNOSIS — I359 Nonrheumatic aortic valve disorder, unspecified: Secondary | ICD-10-CM

## 2014-07-16 LAB — COMPREHENSIVE METABOLIC PANEL
ALBUMIN: 3.4 g/dL — AB (ref 3.5–5.2)
ALT: 17 U/L (ref 0–53)
AST: 20 U/L (ref 0–37)
Alkaline Phosphatase: 98 U/L (ref 39–117)
Anion gap: 8 (ref 5–15)
BILIRUBIN TOTAL: 1.1 mg/dL (ref 0.3–1.2)
BUN: 25 mg/dL — AB (ref 6–23)
CHLORIDE: 93 meq/L — AB (ref 96–112)
CO2: 36 mmol/L — ABNORMAL HIGH (ref 19–32)
CREATININE: 1.71 mg/dL — AB (ref 0.50–1.35)
Calcium: 9.1 mg/dL (ref 8.4–10.5)
GFR calc Af Amer: 42 mL/min — ABNORMAL LOW (ref >90)
GFR calc non Af Amer: 36 mL/min — ABNORMAL LOW (ref >90)
Glucose, Bld: 110 mg/dL — ABNORMAL HIGH (ref 70–99)
Potassium: 4.4 mmol/L (ref 3.5–5.1)
Sodium: 137 mmol/L (ref 135–145)
Total Protein: 6.9 g/dL (ref 6.0–8.3)

## 2014-07-16 LAB — GLUCOSE, CAPILLARY
GLUCOSE-CAPILLARY: 114 mg/dL — AB (ref 70–99)
Glucose-Capillary: 113 mg/dL — ABNORMAL HIGH (ref 70–99)

## 2014-07-16 LAB — MAGNESIUM: MAGNESIUM: 2.4 mg/dL (ref 1.5–2.5)

## 2014-07-16 MED ORDER — LORAZEPAM 1 MG PO TABS: 1.0000 mg | ORAL_TABLET | Freq: Every morning | ORAL | Status: DC

## 2014-07-16 MED ORDER — POLYETHYLENE GLYCOL 3350 17 G PO PACK: 17.0000 g | PACK | Freq: Two times a day (BID) | ORAL | Status: DC

## 2014-07-16 MED ORDER — IPRATROPIUM-ALBUTEROL 0.5-2.5 (3) MG/3ML IN SOLN: 3.0000 mL | Freq: Three times a day (TID) | RESPIRATORY_TRACT | Status: DC

## 2014-07-16 MED ORDER — OXYCODONE-ACETAMINOPHEN 10-325 MG PO TABS: 1.0000 | ORAL_TABLET | Freq: Three times a day (TID) | ORAL | Status: DC | PRN

## 2014-07-16 MED ORDER — DSS 100 MG PO CAPS: 100.0000 mg | ORAL_CAPSULE | Freq: Two times a day (BID) | ORAL | Status: AC

## 2014-07-16 MED ORDER — FUROSEMIDE 40 MG PO TABS: ORAL_TABLET | ORAL | Status: DC

## 2014-07-16 MED ORDER — PREGABALIN 100 MG PO CAPS: 100.0000 mg | ORAL_CAPSULE | Freq: Two times a day (BID) | ORAL | Status: DC

## 2014-07-16 NOTE — Progress Notes (Addendum)
CSW (Clinical Social Worker) prepared pt dc packet and placed with shadow chart. CSW arranged non-emergent ambulance transport. Pt, pt family, pt nurse, and facility informed. CSW signing off.  Cicilia Clinger, LCSWA [phone]  

## 2014-07-16 NOTE — Clinical Social Work Psychosocial (Signed)
     Clinical Social Work Department BRIEF PSYCHOSOCIAL ASSESSMENT 07/16/2014  Patient:  [name], [name]     Account Number:  [id number]     Admit date:  07/13/2014  Clinical Social Worker:  Harless Nakayama  Date/Time:  07/16/2014 09:30 AM  Referred by:  Physician  Date Referred:  07/16/2014 Referred for  SNF Placement   Other Referral:   Interview type:  Patient Other interview type:    PSYCHOSOCIAL DATA Living Status:  ALONE Admitted from facility:   Level of care:   Primary support name:  Ardell Isaacs Primary support relationship to patient:  CHILD, ADULT Degree of support available:   Pt has good family support    CURRENT CONCERNS Current Concerns  Post-Acute Placement   Other Concerns:    SOCIAL WORK ASSESSMENT / PLAN CSW made aware by MD that pt will need SNF at dc and potentially medically ready today. CSW visited pt room and spoke with pt about ST rehab. Pt familiar with process and informed CSW he has been to [employer] where a family member works before and is agreeable to return. Pt expressed that he very much appreciated the care he received previous at facility and saw no issue with him returning. Pt informed CSW he does live alone but multiple family members live next door/behind his house and are available to assist as needed. Pt aware of potential for dc today. CSW called [facility] and notified of referral.   Assessment/plan status:  Psychosocial Support/Ongoing Assessment of Needs Other assessment/ plan:   Information/referral to community resources:   SNF list to be provided if needed    PATIENTS/FAMILYS RESPONSE TO PLAN OF CARE: Pt is agreeable to dc to SNF.    Haadi Santellan, LCSWA  [phone]

## 2014-07-16 NOTE — Progress Notes (Signed)
CSW (Clinical Child psychotherapist) made aware by [facility] that they can accept pt today. MD aware dc summary required by 12pm  Delmus Warwick, LCSWA [phone]

## 2014-07-16 NOTE — Clinical Social Work Placement (Addendum)
    Clinical Social Work Department CLINICAL SOCIAL WORK PLACEMENT NOTE 07/16/2014  Patient:  ERICA, ROETH  Account Number:  [id number] Admit date:  07/13/2014  Clinical Social Worker:  Harless Nakayama  Date/time:  07/16/2014 09:34 AM  Clinical Social Work is seeking post-discharge placement for this patient at the following level of care:   SKILLED NURSING   (*CSW will update this form in Epic as items are completed)   07/16/2014  Patient/family provided with [facility] Department of Clinical Social Work's list of facilities offering this level of care within the geographic area requested by the patient (or if unable, by the patient's family).  07/16/2014  Patient/family informed of their freedom to choose among providers that offer the needed level of care, that participate in Medicare, Medicaid or managed care program needed by the patient, have an available bed and are willing to accept the patient.  07/16/2014  Patient/family informed of [facility]' ownership interest in [facility], as well as of the fact that they are under no obligation to receive care at this facility.  PASARR submitted to EDS on existing PASARR number received on   FL2 transmitted to all facilities in geographic area requested by pt/family on  07/16/2014 FL2 transmitted to all facilities within larger geographic area on   Patient informed that his/her managed care company has contracts with or will negotiate with  certain facilities, including the following:     Patient/family informed of bed offers received:  07/16/2014 Patient chooses bed at [facility] Physician recommends and patient chooses bed at    [facility]  07/16/2014 Patient to be transferred to facility by [facility] Patient and family notified of transfer on 07/16/2014 Name of family member notified:  Ardell Isaacs The following physician request were entered in Epic: Physician Request   Please sign FL2.    Additional CommentsSharol Harness, LCSWA [phone]

## 2014-07-16 NOTE — Progress Notes (Signed)
Subjective:  No SOB or pain.  Good diuresis.  OK to go to SNF by me  Objective:  Vital Signs in the last 24 hours: BP 123/59 mmHg  Pulse 91  Temp(Src) 97.1 F (36.2 C) (Oral)  Resp 20  Ht 5\' 9"  (1.753 m)  Wt 107.5 kg (236 lb 15.9 oz)  BMI 34.98 kg/m2  SpO2 97%  Physical Exam: Obese WM in NAD Lungs: clear Cardiac:  Regular rhythm, normal S1 and S2, no S3, 2/6 murmur of AS Abdomen:  Soft, nontender, no masses Extremities: no edema present  Intake/Output from previous day: 12/23 0701 - 12/24 0700 In: 600 [P.O.:600] Out: 3711 [Urine:3710; Stool:1]   Weight Filed Weights   07/15/14 0500 07/15/14 1533 07/16/14 0558  Weight: 115.5 kg (254 lb 10.1 oz) 109.135 kg (240 lb 9.6 oz) 107.5 kg (236 lb 15.9 oz)    Lab Results: Basic Metabolic Panel:  Recent Labs  26/20/35 0325 07/16/14 0435  NA 138 137  K 4.5 4.4  CL 96 93*  CO2 36* 36*  GLUCOSE 125* 110*  BUN 29* 25*  CREATININE 1.72* 1.71*    CBC:  Recent Labs  07/13/14 1928 07/14/14 0133 07/15/14 0333  WBC 5.3 5.6 7.5  NEUTROABS 3.1  --  5.6  HGB 13.5 12.8* 14.2  HCT 43.7 42.5 46.4  MCV 93.4 93.6 95.7  PLT 109* 106* 107*    BNP    Component Value Date/Time   PROBNP 814.6* 07/13/2014 1928    PROTIME: Lab Results  Component Value Date   INR 1.07 05/29/2014   INR 0.96 04/15/2010   INR 0.9 09/25/2007    Telemetry: Sinus rhythm  Assessment/Plan:  1. Acute hypercarbic respiratory failure improved 2. Acute on chronic diastolic CHF resolved 3. Stage 3 CKD stable 4. CAD stable 5. Moderate AS previously  Rec:  OK to go to SNF.  Will need daily weights and careful attention to volume overload or weight gain when d/c from snf. I need to see early after discharge from SNF.                       Darden Palmer  MD [facility]  07/16/2014, 10:11 AM

## 2014-07-16 NOTE — Discharge Summary (Signed)
Physician Discharge Summary  Patient ID: Roney Youtz MRN: [id number] DOB/AGE: 01/18/1933 80 y.o.  Admit date: 07/13/2014 Discharge date: 07/16/2014  Primary Care Physician:  [name], MD  Discharge Diagnoses:    . Acute respiratory failure with hypoxia and hypercarbia . Respiratory acidosis . Acute on chronic diastolic CHF with moderate aortic stenosis  . Obstructive sleep apnea with obesity hypoventilation syndrome, on CPAP . Bradycardia . CKD (chronic kidney disease), stage III . Aortic stenosis . Dyslipidemia . Chronic venous insufficiency . CKD (chronic kidney disease), stage III . Bradycardia, drug induced . Thrombocytopenia . Type 2 diabetes mellitus with vascular disease . Hypertension   Consults: Cardiology, Dr. Donnie Aho   Recommendations for Outpatient Follow-up:  Please note Lasix has been changed to 80 mg in a.m. and 40 mg in p.m.  Lyrica decreased down to 100 mg twice a day  Please check weight daily and document. Patient will need further adjustment of Lasix outpatient  TESTS THAT NEED FOLLOW-UP Please check a BMET on Monday 12/28   DIET: Carb modified, 2 g sodium low, heart healthy diet, fluid restriction 1800 mL/24hrs    Allergies:   Allergies  Allergen Reactions  . Statins Other (See Comments)    Severe leg myalgias, weakness     Discharge Medications:   Medication List    STOP taking these medications        Fish Oil 1000 MG Caps     metoprolol tartrate 25 MG tablet  Commonly known as:  LOPRESSOR     sulfamethoxazole-trimethoprim 800-160 MG per tablet  Commonly known as:  BACTRIM DS,SEPTRA DS      TAKE these medications        allopurinol 300 MG tablet  Commonly known as:  ZYLOPRIM  Take 300 mg by mouth every morning.     aspirin EC 81 MG tablet  Take 81 mg by mouth daily.     bisacodyl 10 MG suppository  Commonly known as:  DULCOLAX  Place 1 suppository (10 mg total) rectally daily as needed for moderate  constipation.     buPROPion 150 MG 24 hr tablet  Commonly known as:  WELLBUTRIN XL  Take 1 tablet (150 mg total) by mouth every morning.     cholecalciferol 1000 UNITS tablet  Commonly known as:  VITAMIN D  Take 1,000 Units by mouth daily.     clopidogrel 75 MG tablet  Commonly known as:  PLAVIX  Take 1 tablet (75 mg total) by mouth daily.     DSS 100 MG Caps  Take 100 mg by mouth 2 (two) times daily.     Ferrous Gluconate 256 (28 FE) MG Tabs  Take 256 mg by mouth daily.     furosemide 40 MG tablet  Commonly known as:  LASIX  Take 2 tabs (80mg ) in AM and 40mg  (1tab) in PM     ipratropium-albuterol 0.5-2.5 (3) MG/3ML Soln  Commonly known as:  DUONEB  Take 3 mLs by nebulization 3 (three) times daily.     LORazepam 1 MG tablet  Commonly known as:  ATIVAN  Take 1 tablet (1 mg total) by mouth every morning.     Magnesium 250 MG Tabs  Take 250 mg by mouth daily.     mirtazapine 15 MG tablet  Commonly known as:  REMERON  Take 1 tablet (15 mg total) by mouth at bedtime.     MYRBETRIQ 50 MG Tb24 tablet  Generic drug:  mirabegron ER  Take 50 mg by mouth daily.  ondansetron 8 MG tablet  Commonly known as:  ZOFRAN  Take 1 tablet (8 mg total) by mouth every 8 (eight) hours as needed for nausea or vomiting.     oxyCODONE-acetaminophen 10-325 MG per tablet  Commonly known as:  PERCOCET  Take 1 tablet by mouth every 8 (eight) hours as needed for pain.     pantoprazole 40 MG tablet  Commonly known as:  PROTONIX  Take 1 tablet (40 mg total) by mouth 2 (two) times daily.     polyethylene glycol packet  Commonly known as:  MIRALAX / GLYCOLAX  Take 17 g by mouth 2 (two) times daily.     pregabalin 100 MG capsule  Commonly known as:  LYRICA  Take 1 capsule (100 mg total) by mouth 2 (two) times daily.     ranitidine 150 MG tablet  Commonly known as:  ZANTAC  Take 150 mg by mouth as needed for heartburn.     rOPINIRole 4 MG tablet  Commonly known as:  REQUIP  Take 4 mg  by mouth at bedtime.     tamsulosin 0.4 MG Caps capsule  Commonly known as:  FLOMAX  Take 1 capsule (0.4 mg total) by mouth daily.         Brief H and P: For complete details please refer to admission H and P, but in brief patient is a 80 year old male with multiple medical problems including CAD, stage III, chronic respiratory failure, OSA, obesity hypoventilation, aortic stenosis, dyslipidemia, presented with altered mental status and shortness of breath. Patient was not able to provide much history. Patient was recently seen in ED on 12/18 with similar complaints. He was discharged home and family had noted that he had not had much energy, was getting short of breath on minimal exertion. Patient apparently had been pretty active prior to her previous admission which had resulted in intubation due to respiratory failure. He has had some wheezing noted. Patient also had UTI recently and was treated with Bactrim. Patient was admitted for further workup.   Hospital Course:  Patient is a 80 year old male with anxiety, depression, noncompliance w/ medications, OSA on CPAP, OHS, morbidly obese, MSSA pericarditis, HTN, TIA, MI, and gout who was discharged from this facility on 11/13 after being treated for acute respiratory failure with hypoxia and hypercarbia. During that admission he was felt to be volume overloaded but was also found to have rhinovirus positive upper respiratory infection. During that admission he had transient bradycardia that was felt to be related to hypoxemia. Prior to discharge this had resolved and he was tolerating his beta blocker without bradycardia. He was discharged to a skilled nursing facility for rehabilitation and subsequently recovered enough to return to home. He has followed up with his pulmonologist since discharge to discuss his sleep apnea. Plans were to have his CPAP downloaded to determine if it is functioning appropriately and if patient is compliant (during  previous hospitalization patient had been refusing CPAP). According to outpatient note from pulmonology patient reported improved compliance since last admission. Of note most recently primary care physician have been notified that during routine vital sign checks at home patient's pulse had been dipping to as low as in the 40s.   He had been evaluated in the ER on 12/18 for complaints of abdominal pain and shortness of breath. He was found to have abnormal urinalysis and was treated for a a urinary tract infection and was given a prescription for Septra. He was not hypoxic at ambulation  so was discharged. Of note subsequent urine culture was negative.   He returned to the ER on 12/21 with complaints of shortness of breath with ambulation. He was found to have sinus bradycardia with rates in the 50s. ABG revealed respiratory acidosis secondary to hypercarbia. He was placed on BiPAP. Chest x-ray was unremarkable. He did not have any leukocytosis or fever but was noted to have a new thrombocytopenia. He stated his lower extremity edema is stable but he has noticed increased weeping and new redness of the skin.  Multifactorial acute on chronic respiratory failure with hypoxia and hypercarbia Chest x-ray showed new ill-defined opacities in the bilateral bases that could be either edema or pneumonia. Patient had no fevers, Procalcitonin <0.10 and no other clinical signs or symptoms to suggest pneumonia and hence antibiotics were discontinued. Patient has an appointment with pulmonology in early January for hospital follow-up. He also has chronic respiratory failure scan treated to obstructive sleep apnea, obesity hypoventilation. Patient was noted to be noncompliant with CPAP during hospitalization and refused it.    Respiratory Acidosis -multifactorial to Include OSA, OHS, and Drug Induced. Lyrica is decreased down to 100 mg twice a day titrated down slowly over weeks.   Acute on chronic Diastolic CHF - Mod  [facility] was consulted and patient was placed on IV diuresis. Patient has thus far negative balance of 7.4 L, weight down from 250lbs to 236 lbs. patient was being seen by Dr. Donnie Aho, recommended to discharge on oral Lasix 80 mg in a.m. and 40 mg in p.m. Please check a BMET on Monday 12/28 and record daily weights. (Patient was on Lasix 40 mg twice a day prior to admission.)   Obstructive sleep apnea, on CPAP/ OHS -likely etiology to hypercarbic failure. Family was concerned that his CPAP mask/machine could be malfunctioning. Patient has been stable off daily BiPAP. It is strongly recommended to continue nocturnal CPAP. However patient has been somewhat noncompliant with CPAP during hospitalization. No documented pulmonary hypertension or RV systolic dysfunction that would be cause of heart failure On Lyrica likely for Neuropathic pain - 200 mg BID - of note > 300 mg per day gives no inc benefit but increases side effects such as somnolence - decrease to 100 mg BID for now, plan to titrate down or off over weeks  Bradycardia, drug induced -Hemodynamically stable at rest, heart rates in the 50s but QTC prolonged at 567 . Metoprolol has been discontinued.  Type 2 diabetes mellitus  -Current CBGs well-controlled, Hemoglobin A1c 6.2, continue carb modified diet   Chronic kidney disease stage III -Current renal function appears to be around baseline, average BUN in the 30s and creatinine ranges from 1.5 to 2.5  Thrombocytopenia -New since discharge and suspect related to recent Septra; has had transiently in past and presumed related to infectious processes. Septra discontinued upon admission.   Known CAD with prior PCI -Continue aspirin and Plavix -Beta blocker on hold as above -Last cath 2013  Dyslipidemia -Documented allergy to statins  Ileus / Constipation  -Patient noted to have ileus on 12/18 acute abdominal series, with continued abdominal pain to palpation - by abdominal  x-ray 12/21 may have resolved however patient continued to have constipation - advance bowel tx   Chronic venous insufficiency -Initially suspected patient may have cellulitis of lower extremities but exam more consistent with stasis dermatitis  Hypertension -Medications as above  BPH -Continue Flomax  Restless leg syndrome -Continue current home medication regimen, on Remeron and Myrbetriq  Day of Discharge BP 123/59 mmHg  Pulse 91  Temp(Src) 97.1 F (36.2 C) (Oral)  Resp 20  Ht 5\' 9"  (1.753 m)  Wt 107.5 kg (236 lb 15.9 oz)  BMI 34.98 kg/m2  SpO2 97%  Physical Exam: General: Alert and awake oriented x3 not in any acute distress. CVS: S1-S2 clear , 3/6 holosystolic  Chest: clear to auscultation bilaterally, no wheezing rales or rhonchi Abdomen:Morbidly obese,  soft nontender, nondistended, normal bowel sounds Extremities: no cyanosis, clubbing, trace edema noted bilaterally Neuro: Cranial nerves II-XII intact, no focal neurological deficits   The results of significant diagnostics from this hospitalization (including imaging, microbiology, ancillary and laboratory) are listed below for reference.    LAB RESULTS: Basic Metabolic Panel:  Recent Labs Lab 07/15/14 0325  07/16/14 0435  NA 138  --  137  K 4.5  --  4.4  CL 96  --  93*  CO2 36*  --  36*  GLUCOSE 125*  --  110*  BUN 29*  --  25*  CREATININE 1.72*  --  1.71*  CALCIUM 9.1  --  9.1  MG  --   < > 2.4  < > = values in this interval not displayed. Liver Function Tests:  Recent Labs Lab 07/15/14 0325 07/16/14 0435  AST 21 20  ALT 23 17  ALKPHOS 103 98  BILITOT 0.8 1.1  PROT 7.5 6.9  ALBUMIN 3.7 3.4*    Recent Labs Lab 07/10/14 0801  LIPASE 45   No results for input(s): AMMONIA in the last 168 hours. CBC:  Recent Labs Lab 07/14/14 0133 07/15/14 0333  WBC 5.6 7.5  NEUTROABS  --  5.6  HGB 12.8* 14.2  HCT 42.5 46.4  MCV 93.6 95.7  PLT 106* 107*   Cardiac Enzymes:  Recent Labs Lab  07/14/14 0740 07/14/14 1248  TROPONINI <0.03 <0.03   BNP: Invalid input(s): POCBNP CBG:  Recent Labs Lab 07/15/14 1629 07/15/14 2112  GLUCAP 173* 127*    Significant Diagnostic Studies:  Ct Head Wo Contrast  07/13/2014   CLINICAL DATA:  Headache, bilateral leg swelling  EXAM: CT HEAD WITHOUT CONTRAST  TECHNIQUE: Contiguous axial images were obtained from the base of the skull through the vertex without intravenous contrast.  COMPARISON:  05/29/2014  FINDINGS: Mild cortical volume loss noted with proportional ventricular prominence. Areas of periventricular white matter hypodensity are most compatible with small vessel ischemic change. No acute hemorrhage, infarct, or mass lesion is identified. No midline shift. Mild ethmoid mucoperiosteal thickening. No skull fracture. Remote nasal bone deformity reidentified.  IMPRESSION: No acute intracranial finding.   Electronically Signed   By: Christiana Pellant M.D.   On: 07/13/2014 23:40   Dg Chest Port 1 View  07/13/2014   CLINICAL DATA:  Initial evaluation for shortness of breath.  EXAM: PORTABLE CHEST - 1 VIEW  COMPARISON:  Prior radiograph from 07/10/2014.  FINDINGS: Cardiomegaly is stable from prior study. Atherosclerotic calcifications noted within the aortic arch.  Lungs are mildly hypoinflated. Patchy and linear bibasilar opacities favored to reflect atelectasis, similar to prior. Mild diffuse bronchitic changes present. No focal infiltrates identified. No pulmonary edema or pleural effusion. No pneumothorax.  No acute osseus abnormality.  IMPRESSION: Shallow lung inflation with patchy and linear bibasilar atelectasis. Overall, the appearance of the chest is not significantly changed relative to 07/10/2014. No other active cardiopulmonary disease identified.   Electronically Signed   By: Rise Mu M.D.   On: 07/13/2014 20:22   Dg Abd 2  Views  07/13/2014   CLINICAL DATA:  Shortness of breath with abdominal pain for 3 days.  EXAM:  ABDOMEN - 2 VIEW  COMPARISON:  Radiographs 07/10/2014. Chest radiographs done today are correlated.  FINDINGS: Somewhat nodular reticular densities at both lung bases are similar to recent studies and likely exacerbated by lower lung volumes on this study. The bowel gas pattern is normal. There is no free intraperitoneal air or bowel wall thickening. There are no suspicious abdominal calcifications. There are degenerative and postsurgical changes throughout the lumbar spine.  IMPRESSION: No acute abdominal findings.  The bowel gas pattern remains normal.   Electronically Signed   By: Roxy HorsemanBill  Veazey M.D.   On: 07/13/2014 21:10    2D ECHO:   Disposition and Follow-up: Discharge Instructions    (HEART FAILURE PATIENTS) Call MD:  Anytime you have any of the following symptoms: 1) 3 pound weight gain in 24 hours or 5 pounds in 1 week 2) shortness of breath, with or without a dry hacking cough 3) swelling in the hands, feet or stomach 4) if you have to sleep on extra pillows at night in order to breathe.    Complete by:  As directed      Discharge instructions    Complete by:  As directed   Check weight daily and document.   Strict heart healthy diet, low sodium 2g, fluid restriction 1800cc     Increase activity slowly    Complete by:  As directed             DISPOSITION: Skilled nursing facility   DISCHARGE FOLLOW-UP Follow-up Information    Follow up with PARRETT,TAMMY, NP On 07/31/2014.   Specialty:  Nurse Practitioner   Why:  2:00pm    Contact information:   [address] [place] [address] [phone]       Follow up with Darden PalmerILLEY JR,W SPENCER, MD In 10 days.   Specialty:  Cardiology   Why:  for hospital follow-up   Contact information:   [address] [place] [address] [phone]       Follow up with Rene PaciValerie Leschber, MD. Schedule an appointment as soon as possible for a visit in 2 weeks.   Specialty:  Internal Medicine   Why:  for hospital  follow-up   Contact information:   [address] [place] [address] [phone]        Time spent on Discharge: 40mins   Signed:   Kanai Berrios M.D. [facility] 07/16/2014, 11:00 AM Pager: [phone]

## 2014-07-16 NOTE — Care Management Note (Signed)
    Page 1 of 2   07/16/2014     12:02:33 PM CARE MANAGEMENT NOTE 07/16/2014  Patient:  [name], [name]   Account Number:  [id number]  Date Initiated:  07/14/2014  Documentation initiated by:  Avie Arenas  Subjective/Objective Assessment:   SOB - requiring bipap     Action/Plan:   CM to follow for disposition needs   Anticipated DC Date:  07/21/2014   Anticipated DC Plan:  SKILLED NURSING FACILITY  In-house referral  Clinical Social Worker      DC Planning Services  CM consult      Choice offered to / List presented to:             Status of service:  Completed, signed off Medicare Important Message given?  YES (If response is "NO", the following Medicare IM given date fields will be blank) Date Medicare IM given:  07/16/2014 Medicare IM given by:  Tannar Broker Date Additional Medicare IM given:   Additional Medicare IM given by:    Discharge Disposition:  SKILLED NURSING FACILITY  Per UR Regulation:  Reviewed for med. necessity/level of care/duration of stay  If discussed at Long Length of Stay Meetings, dates discussed:    Comments:  Ariza Evans RN, BSN, MSHL, CCM  Nurse - Case Manager,  ([facility])  [phone]   07/16/2014 SNF:  [facility]     Contact: Pressley,Debra Daughter [phone]  [phone]    [facility] Son   [phone]  07-14-14 10:50am Avie Arenas, [phone] Patient was walking around unit with PT.  Per PT impulsive. Per patient - dc from SNF in 8 days.  Lives at home alone. daughter lives close and she or her husband stay with him at night.  Does not have oxygen at home.  Gets his CPAP from company off [employer].  They exchanged tubing and mask about 3 months ago.  Does not remember actual name of company.

## 2014-07-17 ENCOUNTER — Inpatient Hospital Stay (HOSPITAL_COMMUNITY)
Admission: EM | Admit: 2014-07-17 | Discharge: 2014-07-21 | DRG: 682 | Disposition: A | Discharge status: Skilled Nursing Facility | Payer: Medicare Other | Attending: Internal Medicine | Admitting: Internal Medicine

## 2014-07-17 ENCOUNTER — Emergency Department (HOSPITAL_COMMUNITY): Payer: Medicare Other

## 2014-07-17 ENCOUNTER — Encounter (HOSPITAL_COMMUNITY): Payer: Self-pay | Admitting: Emergency Medicine

## 2014-07-17 DIAGNOSIS — F419 Anxiety disorder, unspecified: Secondary | ICD-10-CM | POA: Diagnosis present

## 2014-07-17 DIAGNOSIS — N179 Acute kidney failure, unspecified: Principal | ICD-10-CM | POA: Diagnosis present

## 2014-07-17 DIAGNOSIS — I872 Venous insufficiency (chronic) (peripheral): Secondary | ICD-10-CM | POA: Diagnosis present

## 2014-07-17 DIAGNOSIS — F329 Major depressive disorder, single episode, unspecified: Secondary | ICD-10-CM | POA: Diagnosis present

## 2014-07-17 DIAGNOSIS — R000 Tachycardia, unspecified: Secondary | ICD-10-CM | POA: Diagnosis present

## 2014-07-17 DIAGNOSIS — Z9842 Cataract extraction status, left eye: Secondary | ICD-10-CM

## 2014-07-17 DIAGNOSIS — G9340 Encephalopathy, unspecified: Secondary | ICD-10-CM | POA: Diagnosis present

## 2014-07-17 DIAGNOSIS — E1169 Type 2 diabetes mellitus with other specified complication: Secondary | ICD-10-CM | POA: Diagnosis present

## 2014-07-17 DIAGNOSIS — I1310 Hypertensive heart and chronic kidney disease without heart failure, with stage 1 through stage 4 chronic kidney disease, or unspecified chronic kidney disease: Secondary | ICD-10-CM | POA: Diagnosis present

## 2014-07-17 DIAGNOSIS — T502X5A Adverse effect of carbonic-anhydrase inhibitors, benzothiadiazides and other diuretics, initial encounter: Secondary | ICD-10-CM | POA: Diagnosis present

## 2014-07-17 DIAGNOSIS — R001 Bradycardia, unspecified: Secondary | ICD-10-CM | POA: Diagnosis present

## 2014-07-17 DIAGNOSIS — B348 Other viral infections of unspecified site: Secondary | ICD-10-CM | POA: Diagnosis present

## 2014-07-17 DIAGNOSIS — R4182 Altered mental status, unspecified: Secondary | ICD-10-CM

## 2014-07-17 DIAGNOSIS — Z9119 Patient's noncompliance with other medical treatment and regimen: Secondary | ICD-10-CM | POA: Diagnosis present

## 2014-07-17 DIAGNOSIS — Z888 Allergy status to other drugs, medicaments and biological substances status: Secondary | ICD-10-CM

## 2014-07-17 DIAGNOSIS — E1159 Type 2 diabetes mellitus with other circulatory complications: Secondary | ICD-10-CM | POA: Diagnosis present

## 2014-07-17 DIAGNOSIS — I350 Nonrheumatic aortic (valve) stenosis: Secondary | ICD-10-CM | POA: Diagnosis present

## 2014-07-17 DIAGNOSIS — Z955 Presence of coronary angioplasty implant and graft: Secondary | ICD-10-CM

## 2014-07-17 DIAGNOSIS — K219 Gastro-esophageal reflux disease without esophagitis: Secondary | ICD-10-CM | POA: Diagnosis present

## 2014-07-17 DIAGNOSIS — R443 Hallucinations, unspecified: Secondary | ICD-10-CM | POA: Diagnosis present

## 2014-07-17 DIAGNOSIS — D696 Thrombocytopenia, unspecified: Secondary | ICD-10-CM | POA: Diagnosis present

## 2014-07-17 DIAGNOSIS — F29 Unspecified psychosis not due to a substance or known physiological condition: Secondary | ICD-10-CM | POA: Diagnosis present

## 2014-07-17 DIAGNOSIS — I2510 Atherosclerotic heart disease of native coronary artery without angina pectoris: Secondary | ICD-10-CM | POA: Diagnosis present

## 2014-07-17 DIAGNOSIS — Z7902 Long term (current) use of antithrombotics/antiplatelets: Secondary | ICD-10-CM

## 2014-07-17 DIAGNOSIS — N183 Chronic kidney disease, stage 3 unspecified: Secondary | ICD-10-CM | POA: Diagnosis present

## 2014-07-17 DIAGNOSIS — I5032 Chronic diastolic (congestive) heart failure: Secondary | ICD-10-CM

## 2014-07-17 DIAGNOSIS — I10 Essential (primary) hypertension: Secondary | ICD-10-CM

## 2014-07-17 DIAGNOSIS — G2581 Restless legs syndrome: Secondary | ICD-10-CM | POA: Diagnosis present

## 2014-07-17 DIAGNOSIS — F0391 Unspecified dementia with behavioral disturbance: Secondary | ICD-10-CM | POA: Diagnosis present

## 2014-07-17 DIAGNOSIS — E1151 Type 2 diabetes mellitus with diabetic peripheral angiopathy without gangrene: Secondary | ICD-10-CM

## 2014-07-17 DIAGNOSIS — Z6834 Body mass index (BMI) 34.0-34.9, adult: Secondary | ICD-10-CM

## 2014-07-17 DIAGNOSIS — M109 Gout, unspecified: Secondary | ICD-10-CM | POA: Diagnosis present

## 2014-07-17 DIAGNOSIS — N400 Enlarged prostate without lower urinary tract symptoms: Secondary | ICD-10-CM | POA: Diagnosis present

## 2014-07-17 DIAGNOSIS — E860 Dehydration: Secondary | ICD-10-CM | POA: Diagnosis present

## 2014-07-17 DIAGNOSIS — Z9981 Dependence on supplemental oxygen: Secondary | ICD-10-CM

## 2014-07-17 DIAGNOSIS — G4733 Obstructive sleep apnea (adult) (pediatric): Secondary | ICD-10-CM | POA: Diagnosis present

## 2014-07-17 DIAGNOSIS — J9611 Chronic respiratory failure with hypoxia: Secondary | ICD-10-CM | POA: Diagnosis present

## 2014-07-17 DIAGNOSIS — Z8673 Personal history of transient ischemic attack (TIA), and cerebral infarction without residual deficits: Secondary | ICD-10-CM

## 2014-07-17 DIAGNOSIS — N390 Urinary tract infection, site not specified: Secondary | ICD-10-CM | POA: Diagnosis present

## 2014-07-17 DIAGNOSIS — Z9841 Cataract extraction status, right eye: Secondary | ICD-10-CM

## 2014-07-17 DIAGNOSIS — Z7982 Long term (current) use of aspirin: Secondary | ICD-10-CM

## 2014-07-17 DIAGNOSIS — E662 Morbid (severe) obesity with alveolar hypoventilation: Secondary | ICD-10-CM | POA: Diagnosis present

## 2014-07-17 DIAGNOSIS — E669 Obesity, unspecified: Secondary | ICD-10-CM | POA: Diagnosis present

## 2014-07-17 HISTORY — DX: Unspecified osteoarthritis, unspecified site: M19.90

## 2014-07-17 HISTORY — DX: Unspecified thoracic, thoracolumbar and lumbosacral intervertebral disc disorder: M51.9

## 2014-07-17 HISTORY — DX: Disorder of arteries and arterioles, unspecified: I77.9

## 2014-07-17 HISTORY — DX: Peripheral vascular disease, unspecified: I73.9

## 2014-07-17 HISTORY — DX: Type 2 diabetes mellitus with other circulatory complications: E11.59

## 2014-07-17 HISTORY — DX: Chronic kidney disease, stage 3 (moderate): N18.3

## 2014-07-17 HISTORY — DX: Nonrheumatic aortic valve disorder, unspecified: I35.9

## 2014-07-17 LAB — CBC WITH DIFFERENTIAL/PLATELET
Basophils Absolute: 0 10*3/uL (ref 0.0–0.1)
Basophils Relative: 0 % (ref 0–1)
EOS PCT: 5 % (ref 0–5)
Eosinophils Absolute: 0.3 10*3/uL (ref 0.0–0.7)
HCT: 48.2 % (ref 39.0–52.0)
HEMOGLOBIN: 15.5 g/dL (ref 13.0–17.0)
Lymphocytes Relative: 24 % (ref 12–46)
Lymphs Abs: 1.6 10*3/uL (ref 0.7–4.0)
MCH: 29.2 pg (ref 26.0–34.0)
MCHC: 32.2 g/dL (ref 30.0–36.0)
MCV: 90.8 fL (ref 78.0–100.0)
MONO ABS: 0.8 10*3/uL (ref 0.1–1.0)
Monocytes Relative: 12 % (ref 3–12)
NEUTROS ABS: 4 10*3/uL (ref 1.7–7.7)
Neutrophils Relative %: 59 % (ref 43–77)
Platelets: 138 10*3/uL — ABNORMAL LOW (ref 150–400)
RBC: 5.31 MIL/uL (ref 4.22–5.81)
RDW: 16.3 % — ABNORMAL HIGH (ref 11.5–15.5)
WBC: 6.7 10*3/uL (ref 4.0–10.5)

## 2014-07-17 LAB — COMPREHENSIVE METABOLIC PANEL
ALBUMIN: 3.8 g/dL (ref 3.5–5.2)
ALK PHOS: 102 U/L (ref 39–117)
ALT: 17 U/L (ref 0–53)
ANION GAP: 16 — AB (ref 5–15)
AST: 27 U/L (ref 0–37)
BUN: 28 mg/dL — AB (ref 6–23)
CO2: 26 mmol/L (ref 19–32)
Calcium: 9.4 mg/dL (ref 8.4–10.5)
Chloride: 96 mEq/L (ref 96–112)
Creatinine, Ser: 2.13 mg/dL — ABNORMAL HIGH (ref 0.50–1.35)
GFR calc Af Amer: 32 mL/min — ABNORMAL LOW (ref >90)
GFR calc non Af Amer: 28 mL/min — ABNORMAL LOW (ref >90)
Glucose, Bld: 141 mg/dL — ABNORMAL HIGH (ref 70–99)
POTASSIUM: 4.4 mmol/L (ref 3.5–5.1)
SODIUM: 138 mmol/L (ref 135–145)
TOTAL PROTEIN: 7.6 g/dL (ref 6.0–8.3)
Total Bilirubin: 0.8 mg/dL (ref 0.3–1.2)

## 2014-07-17 LAB — I-STAT ARTERIAL BLOOD GAS, ED
ACID-BASE EXCESS: 5 mmol/L — AB (ref 0.0–2.0)
BICARBONATE: 29.4 meq/L — AB (ref 20.0–24.0)
O2 Saturation: 95 %
TCO2: 31 mmol/L (ref 0–100)
pCO2 arterial: 40.5 mmHg (ref 35.0–45.0)
pH, Arterial: 7.469 — ABNORMAL HIGH (ref 7.350–7.450)
pO2, Arterial: 69 mmHg — ABNORMAL LOW (ref 80.0–100.0)

## 2014-07-17 LAB — URINALYSIS, ROUTINE W REFLEX MICROSCOPIC
Bilirubin Urine: NEGATIVE
Glucose, UA: NEGATIVE mg/dL
HGB URINE DIPSTICK: NEGATIVE
Ketones, ur: NEGATIVE mg/dL
Leukocytes, UA: NEGATIVE
Nitrite: NEGATIVE
Protein, ur: NEGATIVE mg/dL
SPECIFIC GRAVITY, URINE: 1.01 (ref 1.005–1.030)
Urobilinogen, UA: 0.2 mg/dL (ref 0.0–1.0)
pH: 8 (ref 5.0–8.0)

## 2014-07-17 LAB — CBC
HCT: 46.4 % (ref 39.0–52.0)
Hemoglobin: 14.8 g/dL (ref 13.0–17.0)
MCH: 29.1 pg (ref 26.0–34.0)
MCHC: 31.9 g/dL (ref 30.0–36.0)
MCV: 91.2 fL (ref 78.0–100.0)
Platelets: 114 10*3/uL — ABNORMAL LOW (ref 150–400)
RBC: 5.09 MIL/uL (ref 4.22–5.81)
RDW: 16.1 % — ABNORMAL HIGH (ref 11.5–15.5)
WBC: 6.7 10*3/uL (ref 4.0–10.5)

## 2014-07-17 LAB — TROPONIN I
Troponin I: 0.05 ng/mL — ABNORMAL HIGH (ref <0.031)
Troponin I: 0.05 ng/mL — ABNORMAL HIGH (ref <0.031)

## 2014-07-17 LAB — I-STAT CG4 LACTIC ACID, ED
Lactic Acid, Venous: 1.63 mmol/L (ref 0.5–2.2)
Lactic Acid, Venous: 1.12 mmol/L (ref 0.5–2.2)

## 2014-07-17 LAB — CREATININE, SERUM
Creatinine, Ser: 2.19 mg/dL — ABNORMAL HIGH (ref 0.50–1.35)
GFR calc non Af Amer: 27 mL/min — ABNORMAL LOW (ref >90)
GFR, EST AFRICAN AMERICAN: 31 mL/min — AB (ref >90)

## 2014-07-17 LAB — D-DIMER, QUANTITATIVE: D-Dimer, Quant: 0.86 ug/mL-FEU — ABNORMAL HIGH (ref 0.00–0.48)

## 2014-07-17 MED ORDER — VANCOMYCIN HCL IN DEXTROSE 1-5 GM/200ML-% IV SOLN
1000.0000 mg | Freq: Once | INTRAVENOUS | Status: AC
  Administered 2014-07-17: 1000 mg via INTRAVENOUS

## 2014-07-17 MED ORDER — ACETAMINOPHEN 325 MG PO TABS
650.0000 mg | ORAL_TABLET | Freq: Four times a day (QID) | ORAL | Status: DC | PRN
  Administered 2014-07-19: 650 mg via ORAL
  Filled 2014-07-17: qty 2

## 2014-07-17 MED ORDER — PREGABALIN 100 MG PO CAPS
100.0000 mg | ORAL_CAPSULE | Freq: Two times a day (BID) | ORAL | Status: DC
  Administered 2014-07-19 – 2014-07-21 (×5): 100 mg via ORAL
  Filled 2014-07-17 (×6): qty 1

## 2014-07-17 MED ORDER — VANCOMYCIN HCL IN DEXTROSE 1-5 GM/200ML-% IV SOLN
1000.0000 mg | Freq: Once | INTRAVENOUS | Status: AC
  Administered 2014-07-17: 1000 mg via INTRAVENOUS
  Filled 2014-07-17: qty 200

## 2014-07-17 MED ORDER — ONDANSETRON HCL 4 MG/2ML IJ SOLN: 4.0000 mg | Freq: Four times a day (QID) | INTRAMUSCULAR | Status: DC | PRN

## 2014-07-17 MED ORDER — VANCOMYCIN HCL 10 G IV SOLR: 1250.0000 mg | INTRAVENOUS | Status: DC

## 2014-07-17 MED ORDER — ASPIRIN 81 MG PO CHEW
81.0000 mg | CHEWABLE_TABLET | Freq: Every day | ORAL | Status: DC
  Administered 2014-07-17 – 2014-07-21 (×4): 81 mg via ORAL
  Filled 2014-07-17 (×5): qty 1

## 2014-07-17 MED ORDER — ONDANSETRON HCL 4 MG PO TABS: 4.0000 mg | ORAL_TABLET | Freq: Four times a day (QID) | ORAL | Status: DC | PRN

## 2014-07-17 MED ORDER — LORAZEPAM 2 MG/ML IJ SOLN
1.0000 mg | Freq: Once | INTRAMUSCULAR | Status: AC
  Administered 2014-07-18: 1 mg via INTRAVENOUS
  Filled 2014-07-17: qty 1

## 2014-07-17 MED ORDER — SODIUM CHLORIDE 0.9 % IV BOLUS (SEPSIS)
1000.0000 mL | Freq: Once | INTRAVENOUS | Status: AC
  Administered 2014-07-17: 1000 mL via INTRAVENOUS

## 2014-07-17 MED ORDER — ROPINIROLE HCL 1 MG PO TABS
4.0000 mg | ORAL_TABLET | Freq: Every day | ORAL | Status: DC
  Administered 2014-07-19 – 2014-07-20 (×2): 4 mg via ORAL
  Filled 2014-07-17 (×5): qty 4

## 2014-07-17 MED ORDER — CLOPIDOGREL BISULFATE 75 MG PO TABS
75.0000 mg | ORAL_TABLET | Freq: Every day | ORAL | Status: DC
  Administered 2014-07-17 – 2014-07-21 (×4): 75 mg via ORAL
  Filled 2014-07-17 (×5): qty 1

## 2014-07-17 MED ORDER — HEPARIN SODIUM (PORCINE) 5000 UNIT/ML IJ SOLN
5000.0000 [IU] | Freq: Three times a day (TID) | INTRAMUSCULAR | Status: DC
  Administered 2014-07-18 – 2014-07-21 (×9): 5000 [IU] via SUBCUTANEOUS
  Filled 2014-07-17 (×12): qty 1

## 2014-07-17 MED ORDER — PIPERACILLIN-TAZOBACTAM 3.375 G IVPB 30 MIN
3.3750 g | Freq: Once | INTRAVENOUS | Status: AC
  Administered 2014-07-17: 3.375 g via INTRAVENOUS
  Filled 2014-07-17: qty 50

## 2014-07-17 MED ORDER — IPRATROPIUM-ALBUTEROL 0.5-2.5 (3) MG/3ML IN SOLN
3.0000 mL | RESPIRATORY_TRACT | Status: DC
Start: 2014-07-17 — End: 2014-07-17
  Filled 2014-07-17: qty 3

## 2014-07-17 MED ORDER — ALUM & MAG HYDROXIDE-SIMETH 200-200-20 MG/5ML PO SUSP: 30.0000 mL | Freq: Four times a day (QID) | ORAL | Status: DC | PRN

## 2014-07-17 MED ORDER — SODIUM CHLORIDE 0.9 % IJ SOLN
3.0000 mL | Freq: Two times a day (BID) | INTRAMUSCULAR | Status: DC
  Administered 2014-07-19 – 2014-07-21 (×4): 3 mL via INTRAVENOUS

## 2014-07-17 MED ORDER — IPRATROPIUM-ALBUTEROL 0.5-2.5 (3) MG/3ML IN SOLN
3.0000 mL | Freq: Three times a day (TID) | RESPIRATORY_TRACT | Status: DC
  Administered 2014-07-18 – 2014-07-19 (×3): 3 mL via RESPIRATORY_TRACT
  Filled 2014-07-17 (×3): qty 3

## 2014-07-17 MED ORDER — ALLOPURINOL 300 MG PO TABS
300.0000 mg | ORAL_TABLET | Freq: Every day | ORAL | Status: DC
  Administered 2014-07-17 – 2014-07-21 (×4): 300 mg via ORAL
  Filled 2014-07-17 (×5): qty 1

## 2014-07-17 MED ORDER — OXYCODONE HCL 5 MG PO TABS: 5.0000 mg | ORAL_TABLET | Freq: Four times a day (QID) | ORAL | Status: DC | PRN

## 2014-07-17 MED ORDER — FUROSEMIDE 80 MG PO TABS
80.0000 mg | ORAL_TABLET | Freq: Every day | ORAL | Status: DC
  Filled 2014-07-17: qty 1

## 2014-07-17 MED ORDER — PIPERACILLIN-TAZOBACTAM 3.375 G IVPB: 3.3750 g | Freq: Three times a day (TID) | INTRAVENOUS | Status: DC

## 2014-07-17 MED ORDER — BUPROPION HCL ER (XL) 150 MG PO TB24
150.0000 mg | ORAL_TABLET | Freq: Every day | ORAL | Status: DC
  Administered 2014-07-17 – 2014-07-21 (×4): 150 mg via ORAL
  Filled 2014-07-17 (×5): qty 1

## 2014-07-17 MED ORDER — PANTOPRAZOLE SODIUM 40 MG PO TBEC
40.0000 mg | DELAYED_RELEASE_TABLET | Freq: Two times a day (BID) | ORAL | Status: DC
  Administered 2014-07-19 – 2014-07-21 (×5): 40 mg via ORAL
  Filled 2014-07-17 (×6): qty 1

## 2014-07-17 NOTE — ED Provider Notes (Signed)
CSN: [id number]     Arrival date & time 07/17/14  1216 History   First MD Initiated Contact with Patient 07/17/14 1237     Chief Complaint  Patient presents with  . Hallucinations  . Altered Mental Status   level V caveat altered mental status  History is obtained from records accompany patient and from patient's son-in-law who accompanies him (Consider location/radiation/quality/duration/timing/severity/associated sxs/prior Treatment) HPI Patient noted to be confused, seeing talking to people outside of his room last night he appears to be less confused presently per his son-in-law but still appears to be confused. Patient discharged from here yesterday , and released to skilled nursing facility, treated for hypercarbia, hypoxia felt to be multifactorial. Patient presently without complaint.  area Past Medical History  Diagnosis Date  . Gout, unspecified     severe dz, "treatment done that last 15 years"  . Obesity (BMI 30-39.9)   . Restless leg syndrome   . History of retinal detachment 1994  . Hypertensive heart disease   . CAD (coronary artery disease), native coronary artery   . History of pericarditis 2007    MSSA, s/p pericardial window  . GERD   . DEGENERATIVE JOINT DISEASE   . Sleep apnea in adult     CPAP qhs  . TIA (transient ischemic attack) 2005  . BPH (benign prostatic hypertrophy)     "minor"  . Lumbar spinal stenosis   . Carotid artery occlusion   . Unspecified venous (peripheral) insufficiency   . Stroke May 2003  . MYOCARDIAL INFARCTION 2004, 03/2010    "minor"  . Anxiety   . Depression    Past Surgical History  Procedure Laterality Date  . Pericardial window  2007  . Left arm  2008    shoulder  . Right arm  1970's    shoulder  . Cataract extraction      Left side x's 2 and right  . Retinal detachment surgery      left side  . Carotid endarterectomy Left 12-06-05    cea  . Coronary angioplasty  01/2009    x 1 stent  . Colonoscopy    . Carpal  tunnel release Bilateral   . Back surgery  2013    removed bone spurs  . Inguinal hernia repair Left 10/01/2013    Procedure: HERNIA REPAIR INGUINAL ADULT;  Surgeon: Axel Filler, MD;  Location: [facility];  Service: General;  Laterality: Left;  . Insertion of mesh Left 10/01/2013    Procedure: INSERTION OF MESH;  Surgeon: Axel Filler, MD;  Location: [facility];  Service: General;  Laterality: Left;  . Hernia repair    . Finger amputation  2015    JUST TO FIRST JOINT RIGHT HAND  LAST FINGER   Family History  Problem Relation Age of Onset  . Heart disease Mother   . Diabetes Father   . Heart disease Father   . Breast cancer Sister   . Cancer Sister   . Prostate cancer Brother   . Lung cancer Brother   . Cancer Brother   . Diabetes Daughter   . Diabetes Son    History  Substance Use Topics  . Smoking status: Never Smoker   . Smokeless tobacco: Never Used  . Alcohol Use: No    Review of Systems  Unable to perform ROS Skin: Positive for wound.   altered mental status     Allergies  Statins  Home Medications   Prior to Admission medications   Medication  Sig Start Date End Date Taking? Authorizing Provider  allopurinol (ZYLOPRIM) 300 MG tablet Take 300 mg by mouth every morning. 03/03/14   Historical Provider, MD  aspirin EC 81 MG tablet Take 81 mg by mouth daily.    Historical Provider, MD  bisacodyl (DULCOLAX) 10 MG suppository Place 1 suppository (10 mg total) rectally daily as needed for moderate constipation. 06/05/14   Joseph Art, DO  buPROPion (WELLBUTRIN XL) 150 MG 24 hr tablet Take 1 tablet (150 mg total) by mouth every morning. 10/08/13   Newt Lukes, MD  cholecalciferol (VITAMIN D) 1000 UNITS tablet Take 1,000 Units by mouth daily.    Historical Provider, MD  clopidogrel (PLAVIX) 75 MG tablet Take 1 tablet (75 mg total) by mouth daily. 10/08/13   Newt Lukes, MD  docusate sodium 100 MG CAPS Take 100 mg by mouth 2 (two) times daily. 07/16/14    Ripudeep Jenna Luo, MD  Ferrous Gluconate 256 (28 FE) MG TABS Take 256 mg by mouth daily.    Historical Provider, MD  furosemide (LASIX) 40 MG tablet Take 2 tabs (80mg ) in AM and 40mg  (1tab) in PM 07/16/14   Ripudeep K Rai, MD  ipratropium-albuterol (DUONEB) 0.5-2.5 (3) MG/3ML SOLN Take 3 mLs by nebulization 3 (three) times daily. 07/16/14   Ripudeep Jenna Luo, MD  LORazepam (ATIVAN) 1 MG tablet Take 1 tablet (1 mg total) by mouth every morning. 07/16/14   Ripudeep Jenna Luo, MD  Magnesium 250 MG TABS Take 250 mg by mouth daily.     Historical Provider, MD  mirabegron ER (MYRBETRIQ) 50 MG TB24 tablet Take 50 mg by mouth daily.    Historical Provider, MD  mirtazapine (REMERON) 15 MG tablet Take 1 tablet (15 mg total) by mouth at bedtime. 10/08/13   Newt Lukes, MD  ondansetron (ZOFRAN) 8 MG tablet Take 1 tablet (8 mg total) by mouth every 8 (eight) hours as needed for nausea or vomiting. 06/05/14   Joseph Art, DO  oxyCODONE-acetaminophen (PERCOCET) 10-325 MG per tablet Take 1 tablet by mouth every 8 (eight) hours as needed for pain. 07/16/14   Ripudeep Jenna Luo, MD  pantoprazole (PROTONIX) 40 MG tablet Take 1 tablet (40 mg total) by mouth 2 (two) times daily. 02/10/14   Zannie Cove, MD  polyethylene glycol (MIRALAX / Ethelene Hal) packet Take 17 g by mouth 2 (two) times daily. 07/16/14   Ripudeep Jenna Luo, MD  pregabalin (LYRICA) 100 MG capsule Take 1 capsule (100 mg total) by mouth 2 (two) times daily. 07/16/14   Ripudeep Jenna Luo, MD  ranitidine (ZANTAC) 150 MG tablet Take 150 mg by mouth as needed for heartburn.    Historical Provider, MD  rOPINIRole (REQUIP) 4 MG tablet Take 4 mg by mouth at bedtime.    Historical Provider, MD  tamsulosin (FLOMAX) 0.4 MG CAPS capsule Take 1 capsule (0.4 mg total) by mouth daily. 10/08/13   Newt Lukes, MD   BP 119/53 mmHg  Temp(Src) 98 F (36.7 C) (Oral)  Resp 28  Ht 5\' 9"  (1.753 m)  Wt 237 lb (107.502 kg)  BMI 34.98 kg/m2  SpO2 92% Physical Exam   Constitutional: He is oriented to person, place, and time.  Chronically ill-appearing  HENT:  Head: Normocephalic and atraumatic.  Eyes: Conjunctivae are normal. Pupils are equal, round, and reactive to light.  Neck: Neck supple. No tracheal deviation present. No thyromegaly present.  Cardiovascular: Normal rate and regular rhythm.   No murmur heard. Tachycardic  Pulmonary/Chest: Effort normal and breath sounds normal.  Abdominal: Soft. Bowel sounds are normal. He exhibits no distension. There is no tenderness.  Morbidly obese  Musculoskeletal: Normal range of motion. He exhibits no edema or tenderness.  Neurological: He is alert and oriented to person, place, and time. Coordination normal.  Result extremities  Skin: Skin is warm and dry. No rash noted.  Dime-sized ulcer draining slight amount of yellowish pus on right shin. Otherwise without rash  Psychiatric: He has a normal mood and affect.  Nursing note and vitals reviewed.   ED Course  Procedures (including critical care time) Labs Review Labs Reviewed - No data to display  Imaging Review No results found.   EKG Interpretation   Date/Time:  Friday July 17 2014 12:37:01 EST Ventricular Rate:  120 PR Interval:  111 QRS Duration: 111 QT Interval:  324 QTC Calculation: 458 R Axis:   -41 Text Interpretation:  Sinus tachycardia LVH with IVCD, LAD and secondary  repol abnrm SINCE LAST TRACING HEART RATE HAS INCREASED Confirmed by  Ethelda Chick  MD, Garreth Burnsworth [phone]) on 07/17/2014 12:57:35 PM     Chest xray viewed by me Results for orders placed or performed during the hospital encounter of 07/17/14  Comprehensive metabolic panel  Result Value Ref Range   Sodium 138 135 - 145 mmol/L   Potassium 4.4 3.5 - 5.1 mmol/L   Chloride 96 96 - 112 mEq/L   CO2 26 19 - 32 mmol/L   Glucose, Bld 141 (H) 70 - 99 mg/dL   BUN 28 (H) 6 - 23 mg/dL   Creatinine, Ser 0.86 (H) 0.50 - 1.35 mg/dL   Calcium 9.4 8.4 - 57.8 mg/dL   Total  Protein 7.6 6.0 - 8.3 g/dL   Albumin 3.8 3.5 - 5.2 g/dL   AST 27 0 - 37 U/L   ALT 17 0 - 53 U/L   Alkaline Phosphatase 102 39 - 117 U/L   Total Bilirubin 0.8 0.3 - 1.2 mg/dL   GFR calc non Af Amer 28 (L) >90 mL/min   GFR calc Af Amer 32 (L) >90 mL/min   Anion gap 16 (H) 5 - 15  CBC with Differential  Result Value Ref Range   WBC 6.7 4.0 - 10.5 K/uL   RBC 5.31 4.22 - 5.81 MIL/uL   Hemoglobin 15.5 13.0 - 17.0 g/dL   HCT 46.9 62.9 - 52.8 %   MCV 90.8 78.0 - 100.0 fL   MCH 29.2 26.0 - 34.0 pg   MCHC 32.2 30.0 - 36.0 g/dL   RDW 41.3 (H) 24.4 - 01.0 %   Platelets 138 (L) 150 - 400 K/uL   Neutrophils Relative % 59 43 - 77 %   Neutro Abs 4.0 1.7 - 7.7 K/uL   Lymphocytes Relative 24 12 - 46 %   Lymphs Abs 1.6 0.7 - 4.0 K/uL   Monocytes Relative 12 3 - 12 %   Monocytes Absolute 0.8 0.1 - 1.0 K/uL   Eosinophils Relative 5 0 - 5 %   Eosinophils Absolute 0.3 0.0 - 0.7 K/uL   Basophils Relative 0 0 - 1 %   Basophils Absolute 0.0 0.0 - 0.1 K/uL  Troponin I  Result Value Ref Range   Troponin I 0.05 (H) <0.031 ng/mL  Urinalysis, Routine w reflex microscopic  Result Value Ref Range   Color, Urine YELLOW YELLOW   APPearance CLEAR CLEAR   Specific Gravity, Urine 1.010 1.005 - 1.030   pH 8.0 5.0 - 8.0  Glucose, UA NEGATIVE NEGATIVE mg/dL   Hgb urine dipstick NEGATIVE NEGATIVE   Bilirubin Urine NEGATIVE NEGATIVE   Ketones, ur NEGATIVE NEGATIVE mg/dL   Protein, ur NEGATIVE NEGATIVE mg/dL   Urobilinogen, UA 0.2 0.0 - 1.0 mg/dL   Nitrite NEGATIVE NEGATIVE   Leukocytes, UA NEGATIVE NEGATIVE  D-dimer, quantitative  Result Value Ref Range   D-Dimer, Quant 0.86 (H) 0.00 - 0.48 ug/mL-FEU  I-Stat CG4 Lactic Acid, ED  Result Value Ref Range   Lactic Acid, Venous 1.63 0.5 - 2.2 mmol/L  I-Stat arterial blood gas, ED  Result Value Ref Range   pH, Arterial 7.469 (H) 7.350 - 7.450   pCO2 arterial 40.5 35.0 - 45.0 mmHg   pO2, Arterial 69.0 (L) 80.0 - 100.0 mmHg   Bicarbonate 29.4 (H) 20.0 - 24.0  mEq/L   TCO2 31 0 - 100 mmol/L   O2 Saturation 95.0 %   Acid-Base Excess 5.0 (H) 0.0 - 2.0 mmol/L   Patient temperature 98.6 F    Collection site BRACHIAL ARTERY    Drawn by RT    Sample type ARTERIAL    Ct Head Wo Contrast  07/13/2014   CLINICAL DATA:  Headache, bilateral leg swelling  EXAM: CT HEAD WITHOUT CONTRAST  TECHNIQUE: Contiguous axial images were obtained from the base of the skull through the vertex without intravenous contrast.  COMPARISON:  05/29/2014  FINDINGS: Mild cortical volume loss noted with proportional ventricular prominence. Areas of periventricular white matter hypodensity are most compatible with small vessel ischemic change. No acute hemorrhage, infarct, or mass lesion is identified. No midline shift. Mild ethmoid mucoperiosteal thickening. No skull fracture. Remote nasal bone deformity reidentified.  IMPRESSION: No acute intracranial finding.   Electronically Signed   By: Christiana PellantGretchen  Green M.D.   On: 07/13/2014 23:40   Dg Chest Portable 1 View  07/17/2014   CLINICAL DATA:  Altered mental status.  EXAM: PORTABLE CHEST - 1 VIEW  COMPARISON:  Single view of the chest 07/13/2014.  FINDINGS: The lungs are clear. Heart size is normal. No pneumothorax or pleural effusion. Degenerative change about the shoulders is noted.  IMPRESSION: No acute disease.   Electronically Signed   By: Drusilla Kannerhomas  Dalessio M.D.   On: 07/17/2014 13:31   Dg Chest Port 1 View  07/13/2014   CLINICAL DATA:  Initial evaluation for shortness of breath.  EXAM: PORTABLE CHEST - 1 VIEW  COMPARISON:  Prior radiograph from 07/10/2014.  FINDINGS: Cardiomegaly is stable from prior study. Atherosclerotic calcifications noted within the aortic arch.  Lungs are mildly hypoinflated. Patchy and linear bibasilar opacities favored to reflect atelectasis, similar to prior. Mild diffuse bronchitic changes present. No focal infiltrates identified. No pulmonary edema or pleural effusion. No pneumothorax.  No acute osseus  abnormality.  IMPRESSION: Shallow lung inflation with patchy and linear bibasilar atelectasis. Overall, the appearance of the chest is not significantly changed relative to 07/10/2014. No other active cardiopulmonary disease identified.   Electronically Signed   By: Rise MuBenjamin  McClintock M.D.   On: 07/13/2014 20:22   Dg Abd 2 Views  07/13/2014   CLINICAL DATA:  Shortness of breath with abdominal pain for 3 days.  EXAM: ABDOMEN - 2 VIEW  COMPARISON:  Radiographs 07/10/2014. Chest radiographs done today are correlated.  FINDINGS: Somewhat nodular reticular densities at both lung bases are similar to recent studies and likely exacerbated by lower lung volumes on this study. The bowel gas pattern is normal. There is no free intraperitoneal air or bowel wall thickening. There are  no suspicious abdominal calcifications. There are degenerative and postsurgical changes throughout the lumbar spine.  IMPRESSION: No acute abdominal findings.  The bowel gas pattern remains normal.   Electronically Signed   By: Roxy Horseman M.D.   On: 07/13/2014 21:10   Dg Abd Acute W/chest  07/10/2014   CLINICAL DATA:  Shortness of breath, abdominal distension  EXAM: ACUTE ABDOMEN SERIES (ABDOMEN 2 VIEW & CHEST 1 VIEW)  COMPARISON:  06/01/2014  FINDINGS: Cardiomediastinal silhouette is stable. There is fluid or thickening in right minor fissure. No segmental infiltrate or pulmonary edema. Streaky mild basilar atelectasis.  Mild gaseous distended bowel loops in right abdomen probable mild ileus. Stool and gas noted within distal colon. No free abdominal air. Degenerative changes lumbar spine.  IMPRESSION: No infiltrate or pulmonary edema. Streaky mild basilar atelectasis. Mild gaseous distended small bowel loops in right abdomen probable mild ileus. No air-fluid levels. Stool and gas noted within distal colon. No free abdominal air.   Electronically Signed   By: Natasha Mead M.D.   On: 07/10/2014 08:23    MDM  Code sepsis called based  on Sirs criteria respiratory rate, heart rate, altered mental status. Fluid bolus with held in light of patient's history of fluid retention, kidney disease and fluid overload from prior hospitalization  Final diagnoses:  None   Patient has resting tachycardia. I doubt pulmonary embolism. He has mildly elevated d-dimer however age adjusted almost normal. He's had multiple negative ventilation perfusion scans. Further history per family member. He's had little oral intake since arriving at skilled nursing facility. Resting tachycardia and elevation of creatinine likely secondary to dehydration. Patient is fully oriented and presently. I have observed no hallucinations I spoke with Dr.Zamora plan 23 hour observation telemetry, intravenous fluids   Dx #1 altered mental status #2 renal insufficency   Doug Sou, MD 07/17/14 [phone]

## 2014-07-17 NOTE — Progress Notes (Signed)
ANTIBIOTIC CONSULT NOTE - INITIAL  Pharmacy Consult for Vancomycin and Zosyn Indication: rule out sepsis  Allergies  Allergen Reactions  . Statins Other (See Comments)    Severe leg myalgias, weakness    Patient Measurements: Height: 5\' 9"  (175.3 cm) Weight: 237 lb (107.502 kg) IBW/kg (Calculated) : 70.7  Vital Signs: Temp: 100.3 F (37.9 C) (12/25 1300) Temp Source: Rectal (12/25 1300) BP: 144/64 mmHg (12/25 1330) Pulse Rate: 119 (12/25 1345) Intake/Output from previous day:   Intake/Output from this shift:    Labs:  Recent Labs  07/15/14 0325 07/15/14 0333 07/16/14 0435 07/17/14 1300  WBC  --  7.5  --  6.7  HGB  --  14.2  --  15.5  PLT  --  107*  --  138*  CREATININE 1.72*  --  1.71*  --    Estimated Creatinine Clearance: 42.3 mL/min (by C-G formula based on Cr of 1.71). No results for input(s): VANCOTROUGH, VANCOPEAK, VANCORANDOM, GENTTROUGH, GENTPEAK, GENTRANDOM, TOBRATROUGH, TOBRAPEAK, TOBRARND, AMIKACINPEAK, AMIKACINTROU, AMIKACIN in the last 72 hours.   Microbiology: Recent Results (from the past 720 hour(s))  Urine culture     Status: None   Collection Time: 07/10/14 10:25 AM  Result Value Ref Range Status   Specimen Description URINE, RANDOM  Final   Special Requests NONE  Final   Culture  Setup Time   Final    07/10/2014 21:22 Performed at [facility]    Colony Count   Final    35,000 COLONIES/ML Performed at [facility]    Culture   Final    Multiple bacterial morphotypes present, none predominant. Suggest appropriate recollection if clinically indicated. Performed at [facility]    Report Status 07/11/2014 FINAL  Final  MRSA PCR Screening     Status: None   Collection Time: 07/14/14  6:01 AM  Result Value Ref Range Status   MRSA by PCR NEGATIVE NEGATIVE Final    Comment:        The GeneXpert MRSA Assay (FDA approved for NASAL specimens only), is one component of a comprehensive MRSA  colonization surveillance program. It is not intended to diagnose MRSA infection nor to guide or monitor treatment for MRSA infections.   Culture, blood (routine x 2)     Status: None (Preliminary result)   Collection Time: 07/14/14  7:40 AM  Result Value Ref Range Status   Specimen Description BLOOD RIGHT ANTECUBITAL  Final   Special Requests BOTTLES DRAWN AEROBIC ONLY 5CC  Final   Culture  Setup Time   Final    07/14/2014 15:08 Performed at [facility]    Culture   Final           BLOOD CULTURE RECEIVED NO GROWTH TO DATE CULTURE WILL BE HELD FOR 5 DAYS BEFORE ISSUING A FINAL NEGATIVE REPORT Performed at [facility]    Report Status PENDING  Incomplete  Culture, blood (routine x 2)     Status: None (Preliminary result)   Collection Time: 07/14/14  7:45 AM  Result Value Ref Range Status   Specimen Description BLOOD RIGHT HAND  Final   Special Requests BOTTLES DRAWN AEROBIC ONLY 10CC  Final   Culture  Setup Time   Final    07/14/2014 15:08 Performed at [facility]    Culture   Final           BLOOD CULTURE RECEIVED NO GROWTH TO DATE CULTURE WILL BE HELD FOR 5 DAYS BEFORE ISSUING A  FINAL NEGATIVE REPORT Performed at [facility]    Report Status PENDING  Incomplete    Medical History: Past Medical History  Diagnosis Date  . Gout, unspecified     severe dz, "treatment done that last 15 years"  . Obesity (BMI 30-39.9)   . Restless leg syndrome   . History of retinal detachment 1994  . Hypertensive heart disease   . CAD (coronary artery disease), native coronary artery   . History of pericarditis 2007    MSSA, s/p pericardial window  . GERD   . DEGENERATIVE JOINT DISEASE   . Sleep apnea in adult     CPAP qhs  . TIA (transient ischemic attack) 2005  . BPH (benign prostatic hypertrophy)     "minor"  . Lumbar spinal stenosis   . Carotid artery occlusion   . Unspecified venous (peripheral) insufficiency   . Stroke May 2003  .  MYOCARDIAL INFARCTION 2004, 03/2010    "minor"  . Anxiety   . Depression     Medications:   (Not in a hospital admission) Scheduled:   Infusions:  . piperacillin-tazobactam 3.375 g (07/17/14 1345)  . vancomycin 1,000 mg (07/17/14 1337)   Assessment: 81yo male presents with lethargy and confusion. Pharmacy is consulted to dose vancomycin and zosyn for sepsis rule out. Pt is febrile to 100.3, WBC 6.7, sCr 1.71 (on 12/24) with CrCl ~ 40 mL/min.  Pt received vancomycin 1g IV at 1330 and zosyn 3.375g IV at 1345 today.  Goal of Therapy:  Vancomycin trough level 15-20 mcg/ml  Plan:  Load with additional 1g of vancomycin IV for total of vancomycin 2g load, Followed by vancomycin 1250mg  IV q24h Zosyn 3.375g IV q8h Measure antibiotic drug levels at steady state Follow up culture results, renal function, and clinical course  Arlean Hopping. Newman Pies, PharmD Clinical Pharmacist Pager [phone] 07/17/2014,2:01 PM

## 2014-07-17 NOTE — H&P (Signed)
[facility] History and Physical  Mason SpragueBilly [name] [name]:[id number]:[id number] DOB: 1932-07-29 DOA: 07/17/2014  Referring physician:  PCP: Mason PaciValerie Leschber, MD   Chief Complaint: Mental status changes  HPI: [name] is a 80 y.o. male with a past medical history morbid obesity, stage III chronic kidney disease, diastolic congestive heart failure with moderate aortic stenosis, cognitive impairment who was recently discharged from the medicine service on 05/16/2014, admitted on 05/13/2014 admitted for acute on chronic hypoxemic hypercarbic respiratory failure secondary to acute on chronic diastolic congestive heart failure as well as noncompliance to CPAP. During that hospitalization he was seen by cardiology and placed on IV diuresis. Patient had a net negative fluid balance of 7.4 L with weight coming down to 236 pounds from 250 pounds. He was discharged to skilled nursing facility, where overnight he was noted by nursing staff to be increasingly agitated. Family members reporting patient having a steep functional decline, as Mr. [name] has become increasingly confused, disoriented, having hallucinations, decreased by mouth intake, and becoming weaker. Initial workup performed in the emergency room included an ABG which showed a PCO2 of 40.5. He did not appear to have an obvious source of infection as his urinalysis was unremarkable, chest x-ray not showing acute cardiopulmonary disease, CBC showing normal white count.                                                                                                                                             Review of Systems:  Difficult to obtain reliable review of systems given cognitive decline  Past Medical History  Diagnosis Date  . Gout, unspecified     severe dz, "treatment done that last 15 years"  . Obesity (BMI 30-39.9)   . Restless leg syndrome   . History of retinal detachment 1994  . Hypertensive heart disease   . CAD (coronary  artery disease), native coronary artery   . History of pericarditis 2007    MSSA, s/p pericardial window  . GERD   . DEGENERATIVE JOINT DISEASE   . Sleep apnea in adult     CPAP qhs  . TIA (transient ischemic attack) 2005  . BPH (benign prostatic hypertrophy)     "minor"  . Lumbar spinal stenosis   . Carotid artery occlusion   . Unspecified venous (peripheral) insufficiency   . Stroke May 2003  . MYOCARDIAL INFARCTION 2004, 03/2010    "minor"  . Anxiety   . Depression    Past Surgical History  Procedure Laterality Date  . Pericardial window  2007  . Left arm  2008    shoulder  . Right arm  1970's    shoulder  . Cataract extraction      Left side x's 2 and right  . Retinal detachment surgery      left side  . Carotid endarterectomy Left 12-06-05    cea  .  Coronary angioplasty  01/2009    x 1 stent  . Colonoscopy    . Carpal tunnel release Bilateral   . Back surgery  2013    removed bone spurs  . Inguinal hernia repair Left 10/01/2013    Procedure: HERNIA REPAIR INGUINAL ADULT;  Surgeon: Axel Filler, MD;  Location: [facility];  Service: General;  Laterality: Left;  . Insertion of mesh Left 10/01/2013    Procedure: INSERTION OF MESH;  Surgeon: Axel Filler, MD;  Location: [facility];  Service: General;  Laterality: Left;  . Hernia repair    . Finger amputation  2015    JUST TO FIRST JOINT RIGHT HAND  LAST FINGER   Social History:  reports that he has never smoked. He has never used smokeless tobacco. He reports that he does not drink alcohol or use illicit drugs.  Allergies  Allergen Reactions  . Statins Other (See Comments)    Severe leg myalgias, weakness    Family History  Problem Relation Age of Onset  . Heart disease Mother   . Diabetes Father   . Heart disease Father   . Breast cancer Sister   . Cancer Sister   . Prostate cancer Brother   . Lung cancer Brother   . Cancer Brother   . Diabetes Daughter   . Diabetes Son      Prior to Admission medications    Medication Sig Start Date End Date Taking? Authorizing Provider  allopurinol (ZYLOPRIM) 300 MG tablet Take 300 mg by mouth every morning. 03/03/14  Yes Historical Provider, MD  aspirin EC 81 MG tablet Take 81 mg by mouth daily.   Yes Historical Provider, MD  buPROPion (WELLBUTRIN XL) 150 MG 24 hr tablet Take 1 tablet (150 mg total) by mouth every morning. 10/08/13  Yes Newt Lukes, MD  cholecalciferol (VITAMIN D) 1000 UNITS tablet Take 1,000 Units by mouth daily.   Yes Historical Provider, MD  clopidogrel (PLAVIX) 75 MG tablet Take 1 tablet (75 mg total) by mouth daily. 10/08/13  Yes Newt Lukes, MD  docusate sodium 100 MG CAPS Take 100 mg by mouth 2 (two) times daily. 07/16/14  Yes Ripudeep Jenna Luo, MD  Ferrous Gluconate 256 (28 FE) MG TABS Take 256 mg by mouth daily.   Yes Historical Provider, MD  furosemide (LASIX) 40 MG tablet Take 2 tabs (80mg ) in AM and 40mg  (1tab) in PM 07/16/14  Yes Ripudeep K Rai, MD  ipratropium-albuterol (DUONEB) 0.5-2.5 (3) MG/3ML SOLN Take 3 mLs by nebulization 3 (three) times daily. 07/16/14  Yes Ripudeep Jenna Luo, MD  LORazepam (ATIVAN) 1 MG tablet Take 1 tablet (1 mg total) by mouth every morning. 07/16/14  Yes Ripudeep Jenna Luo, MD  Magnesium 250 MG TABS Take 250 mg by mouth daily.    Yes Historical Provider, MD  mirabegron ER (MYRBETRIQ) 50 MG TB24 tablet Take 50 mg by mouth daily.   Yes Historical Provider, MD  mirtazapine (REMERON) 15 MG tablet Take 1 tablet (15 mg total) by mouth at bedtime. 10/08/13  Yes Newt Lukes, MD  pantoprazole (PROTONIX) 40 MG tablet Take 1 tablet (40 mg total) by mouth 2 (two) times daily. 02/10/14  Yes Zannie Cove, MD  polyethylene glycol (MIRALAX / GLYCOLAX) packet Take 17 g by mouth 2 (two) times daily. 07/16/14  Yes Ripudeep Jenna Luo, MD  pregabalin (LYRICA) 100 MG capsule Take 1 capsule (100 mg total) by mouth 2 (two) times daily. 07/16/14  Yes Ripudeep Jenna Luo, MD  rOPINIRole (REQUIP) 4 MG tablet Take 4 mg by mouth at  bedtime.   Yes Historical Provider, MD  tamsulosin (FLOMAX) 0.4 MG CAPS capsule Take 1 capsule (0.4 mg total) by mouth daily. 10/08/13  Yes Newt Lukes, MD  bisacodyl (DULCOLAX) 10 MG suppository Place 1 suppository (10 mg total) rectally daily as needed for moderate constipation. 06/05/14   Joseph Art, DO  ondansetron (ZOFRAN) 8 MG tablet Take 1 tablet (8 mg total) by mouth every 8 (eight) hours as needed for nausea or vomiting. 06/05/14   Joseph Art, DO  oxyCODONE-acetaminophen (PERCOCET) 10-325 MG per tablet Take 1 tablet by mouth every 8 (eight) hours as needed for pain. 07/16/14   Ripudeep Jenna Luo, MD  ranitidine (ZANTAC) 150 MG tablet Take 150 mg by mouth as needed for heartburn.    Historical Provider, MD   Physical Exam: Filed Vitals:   07/17/14 1530 07/17/14 1545 07/17/14 1558 07/17/14 1600  BP:  135/86 135/86 128/92  Pulse:  102  111  Temp:   97.8 F (36.6 C)   TempSrc:   Oral   Resp:  18 20 18   Height:      Weight:      SpO2: 92% 89% 94% 92%    Wt Readings from Last 3 Encounters:  07/17/14 107.502 kg (237 lb)  07/16/14 107.5 kg (236 lb 15.9 oz)  07/10/14 113.399 kg (250 lb)    General:  Patient is confused disoriented, can follow simple commands Eyes: PERRL, normal lids, irises & conjunctiva ENT: grossly normal hearing, lips & tongue Neck: no LAD, masses or thyromegaly, difficult to assess for JVD given body habitus Cardiovascular: RRR, no m/r/g. No LE edema. Telemetry: SR, no arrhythmias  Respiratory: CTA bilaterally, no w/r/r. Normal respiratory effort. Abdomen: obese, soft, ntnd Skin: no rash or induration seen on limited exam Musculoskeletal: grossly normal tone BUE/BLE Psychiatric: grossly normal mood and affect, speech fluent and appropriate Neurologic: grossly non-focal.          Labs on Admission:  Basic Metabolic Panel:  Recent Labs Lab 07/13/14 1928 07/14/14 0133 07/15/14 0325 07/15/14 0333 07/16/14 0435 07/17/14 1300  NA 143 140  138  --  137 138  K 4.9 4.5 4.5  --  4.4 4.4  CL 101 103 96  --  93* 96  CO2 32 32 36*  --  36* 26  GLUCOSE 106* 167* 125*  --  110* 141*  BUN 35* 32* 29*  --  25* 28*  CREATININE 1.78* 1.99* 1.72*  --  1.71* 2.13*  CALCIUM 8.8 8.5 9.1  --  9.1 9.4  MG  --   --   --  2.3 2.4  --    Liver Function Tests:  Recent Labs Lab 07/13/14 1928 07/14/14 0133 07/15/14 0325 07/16/14 0435 07/17/14 1300  AST 24 23 21 20 27   ALT 25 25 23 17 17   ALKPHOS 111 95 103 98 102  BILITOT 0.3 0.4 0.8 1.1 0.8  PROT 7.5 6.8 7.5 6.9 7.6  ALBUMIN 3.6 3.5 3.7 3.4* 3.8   No results for input(s): LIPASE, AMYLASE in the last 168 hours. No results for input(s): AMMONIA in the last 168 hours. CBC:  Recent Labs Lab 07/13/14 1928 07/14/14 0133 07/15/14 0333 07/17/14 1300  WBC 5.3 5.6 7.5 6.7  NEUTROABS 3.1  --  5.6 4.0  HGB 13.5 12.8* 14.2 15.5  HCT 43.7 42.5 46.4 48.2  MCV 93.4 93.6 95.7 90.8  PLT 109* 106* 107* 138*  Cardiac Enzymes:  Recent Labs Lab 07/13/14 1928 07/14/14 0132 07/14/14 0740 07/14/14 1248 07/17/14 1300  TROPONINI <0.30 <0.03 <0.03 <0.03 0.05*    BNP (last 3 results)  Recent Labs  05/28/14 1508 07/10/14 0801 07/13/14 1928  PROBNP 477.3* 422.7 814.6*   CBG:  Recent Labs Lab 07/15/14 1238 07/15/14 1629 07/15/14 2112 07/16/14 0556 07/16/14 1112  GLUCAP 119* 173* 127* 113* 114*    Radiological Exams on Admission: Dg Chest Portable 1 View  07/17/2014   CLINICAL DATA:  Altered mental status.  EXAM: PORTABLE CHEST - 1 VIEW  COMPARISON:  Single view of the chest 07/13/2014.  FINDINGS: The lungs are clear. Heart size is normal. No pneumothorax or pleural effusion. Degenerative change about the shoulders is noted.  IMPRESSION: No acute disease.   Electronically Signed   By: Drusilla Kanner M.D.   On: 07/17/2014 13:31    EKG: Independently reviewed. Sinus tachycardia  Assessment/Plan Principal Problem:   Encephalopathy acute Active Problems:   Type 2  diabetes mellitus with vascular disease   Obesity (BMI 30-39.9)   CKD (chronic kidney disease), stage III   AKI (acute kidney injury)   Hypertension   Aortic stenosis   Obesity hypoventilation syndrome   1. Acute encephalopathy. Patient with history of underlying cognitive impairment, discharged to a skilled nursing facility yesterday from the medicine service, having a steep functional decline overnight becoming increasingly encephalopathic. Sundowning could be a possibility. Workup performed in the emergency department has not revealed an obvious source of infection. His chest x-ray and urinalysis were unremarkable, as he was afebrile on presentation. He was found to have sinus tachycardia with lab work showed the development of acute on chronic renal failure which could be suggestive of dehydration. During his recent hospitalization he was diuresed with IV Lasix for acute on chronic CHF. Will place patient on 24-hour observation, hold this evening's dose of Lasix, restart Lasix in a.m. volume status will need to be reassessed.  2. Acute on chronic renal failure. Patient's creatinine increasing to 2.13 from 1.71 yesterday, having recent hospitalization undergoing IV diuresis. I suspect he may be dry, will hold this evening's dose of Lasix, restart diuretics in a.m.Marland Kitchen Repeat lab work. 3. Abnormal troponin. Lab work showing a troponin of 0.05, slightly elevated from 0.03 on 07/14/2014. EKG reviewed did not have acute ischemic changes. Could be demand ischemia from sinus tachycardia. He currently denies chest pain. Will place him on telemetry, follow serial troponins.  4. Chronic diastolic congestive heart failure. Last transthoracic echocardiogram performed on 05/29/2014 which showed an ejection fraction of 50-55%. Presley compensated. Holding this evening's dose of Lasix 5. Moderate aortic stenosis. He does not appear to have acute decompensated heart failure on admission. 6. Obesity hypoventilation  syndrome. Doubt hypercarbia precipitating mental status changes as ABG performed in the emergency room showed a PCO2 of 40.5. Respiratory therapy consult, please CPAP at nighttime 7. DVT prophylaxis continues heparin    Code Status: Full code. Confirmed with his son was present at bedside. Family Communication: I spoke with his son who is present in the emergency department. Disposition Plan: Will place patient in 24 hour, do not anticipate him requiring greater than 2 nights hospitalization  Time spent: 55 minutes  Jeralyn Bennett [facility] Pager [phone]

## 2014-07-17 NOTE — ED Notes (Signed)
Attempted report 

## 2014-07-17 NOTE — Progress Notes (Signed)
Pt. Arrived to the unit via stretcher accompanied by family and ED tech Laban Emperor). Pt. Is alert and oriented with some signs of confusion noted . Vitals appear stable, but pt. Is unable to maintain saturation with 2L. Increased O2 to 4L; nasal cannula and pt. Was able to sat. At 94%. Pt. Has scabbed areas noted on bilateral lower extremities with minimal drainage noted from the right leg. Applied foam dressing. There was also a healed, scabbed, q-tipped sized area on side of pt. Right foot. Educated pt. On use of staff numbers, room telephone, bed alarm and call bell. Educated pt. And family on password set up ([id number]), visiting hours and advanced directive information. Family at bedside, bed alarm on. Call light within reach. Orders released. No further needs noted at this time.

## 2014-07-17 NOTE — ED Notes (Signed)
Respiratory notified about blood gas collection. Per Dr. Ethelda Chick, no fluids to be ordered.

## 2014-07-17 NOTE — Progress Notes (Signed)
Pt refuses to take medication at this time, until he receive his test results. Pt is extremely confused at this time.

## 2014-07-17 NOTE — ED Notes (Signed)
Per [facility], pt from [facility] for rehab, left here recently for respiratory failure. Hx of pericarditis. Last night night started having hallucinations, checked on by his fmaily this morning at 730, was completely AAOX4 and refused to come to the hospital. Pt also started becoming lethargic and "more confused". Ems called back out and pt transported to the hospital. Pt HR for EMS is elevated at 127, EKG unremarkable otherwise. Upon EMS arrival pt is AAOX4. Breaths 22per min, CO level by ems was 14, is now 0. 93% on 4L [place]. Pupils are unequal which is normal. Patient has tremors which is normal. Hx of TIA with residual left sided weakness. Afebrile at facility.

## 2014-07-17 NOTE — Progress Notes (Signed)
Pt refuses tx and CPAP at this time. Pt is extremely agitated and confused at this time. RN aware

## 2014-07-18 DIAGNOSIS — I872 Venous insufficiency (chronic) (peripheral): Secondary | ICD-10-CM | POA: Diagnosis present

## 2014-07-18 DIAGNOSIS — I350 Nonrheumatic aortic (valve) stenosis: Secondary | ICD-10-CM | POA: Diagnosis present

## 2014-07-18 DIAGNOSIS — N179 Acute kidney failure, unspecified: Secondary | ICD-10-CM | POA: Diagnosis present

## 2014-07-18 DIAGNOSIS — Z9119 Patient's noncompliance with other medical treatment and regimen: Secondary | ICD-10-CM | POA: Diagnosis present

## 2014-07-18 DIAGNOSIS — G2581 Restless legs syndrome: Secondary | ICD-10-CM | POA: Diagnosis present

## 2014-07-18 DIAGNOSIS — Z9842 Cataract extraction status, left eye: Secondary | ICD-10-CM | POA: Diagnosis not present

## 2014-07-18 DIAGNOSIS — K219 Gastro-esophageal reflux disease without esophagitis: Secondary | ICD-10-CM | POA: Diagnosis present

## 2014-07-18 DIAGNOSIS — N183 Chronic kidney disease, stage 3 (moderate): Secondary | ICD-10-CM | POA: Diagnosis present

## 2014-07-18 DIAGNOSIS — F29 Unspecified psychosis not due to a substance or known physiological condition: Secondary | ICD-10-CM | POA: Diagnosis present

## 2014-07-18 DIAGNOSIS — G4733 Obstructive sleep apnea (adult) (pediatric): Secondary | ICD-10-CM | POA: Diagnosis present

## 2014-07-18 DIAGNOSIS — R001 Bradycardia, unspecified: Secondary | ICD-10-CM | POA: Diagnosis present

## 2014-07-18 DIAGNOSIS — M109 Gout, unspecified: Secondary | ICD-10-CM | POA: Diagnosis present

## 2014-07-18 DIAGNOSIS — R000 Tachycardia, unspecified: Secondary | ICD-10-CM | POA: Diagnosis present

## 2014-07-18 DIAGNOSIS — Z7902 Long term (current) use of antithrombotics/antiplatelets: Secondary | ICD-10-CM | POA: Diagnosis not present

## 2014-07-18 DIAGNOSIS — F0391 Unspecified dementia with behavioral disturbance: Secondary | ICD-10-CM | POA: Diagnosis present

## 2014-07-18 DIAGNOSIS — N400 Enlarged prostate without lower urinary tract symptoms: Secondary | ICD-10-CM | POA: Diagnosis present

## 2014-07-18 DIAGNOSIS — Z9841 Cataract extraction status, right eye: Secondary | ICD-10-CM | POA: Diagnosis not present

## 2014-07-18 DIAGNOSIS — Z9981 Dependence on supplemental oxygen: Secondary | ICD-10-CM | POA: Diagnosis not present

## 2014-07-18 DIAGNOSIS — Z8673 Personal history of transient ischemic attack (TIA), and cerebral infarction without residual deficits: Secondary | ICD-10-CM | POA: Diagnosis not present

## 2014-07-18 DIAGNOSIS — E1169 Type 2 diabetes mellitus with other specified complication: Secondary | ICD-10-CM | POA: Diagnosis present

## 2014-07-18 DIAGNOSIS — Z6834 Body mass index (BMI) 34.0-34.9, adult: Secondary | ICD-10-CM | POA: Diagnosis not present

## 2014-07-18 DIAGNOSIS — N390 Urinary tract infection, site not specified: Secondary | ICD-10-CM | POA: Diagnosis present

## 2014-07-18 DIAGNOSIS — E860 Dehydration: Secondary | ICD-10-CM

## 2014-07-18 DIAGNOSIS — J9611 Chronic respiratory failure with hypoxia: Secondary | ICD-10-CM | POA: Diagnosis present

## 2014-07-18 DIAGNOSIS — I2510 Atherosclerotic heart disease of native coronary artery without angina pectoris: Secondary | ICD-10-CM | POA: Diagnosis present

## 2014-07-18 DIAGNOSIS — R443 Hallucinations, unspecified: Secondary | ICD-10-CM | POA: Diagnosis present

## 2014-07-18 DIAGNOSIS — E662 Morbid (severe) obesity with alveolar hypoventilation: Secondary | ICD-10-CM | POA: Diagnosis present

## 2014-07-18 DIAGNOSIS — B348 Other viral infections of unspecified site: Secondary | ICD-10-CM | POA: Diagnosis present

## 2014-07-18 DIAGNOSIS — I5032 Chronic diastolic (congestive) heart failure: Secondary | ICD-10-CM | POA: Diagnosis present

## 2014-07-18 DIAGNOSIS — G9340 Encephalopathy, unspecified: Secondary | ICD-10-CM | POA: Diagnosis present

## 2014-07-18 DIAGNOSIS — Z7982 Long term (current) use of aspirin: Secondary | ICD-10-CM | POA: Diagnosis not present

## 2014-07-18 DIAGNOSIS — F419 Anxiety disorder, unspecified: Secondary | ICD-10-CM | POA: Diagnosis present

## 2014-07-18 DIAGNOSIS — T502X5A Adverse effect of carbonic-anhydrase inhibitors, benzothiadiazides and other diuretics, initial encounter: Secondary | ICD-10-CM | POA: Diagnosis present

## 2014-07-18 DIAGNOSIS — F329 Major depressive disorder, single episode, unspecified: Secondary | ICD-10-CM | POA: Diagnosis present

## 2014-07-18 DIAGNOSIS — D696 Thrombocytopenia, unspecified: Secondary | ICD-10-CM | POA: Diagnosis present

## 2014-07-18 DIAGNOSIS — Z888 Allergy status to other drugs, medicaments and biological substances status: Secondary | ICD-10-CM | POA: Diagnosis not present

## 2014-07-18 DIAGNOSIS — R4182 Altered mental status, unspecified: Secondary | ICD-10-CM | POA: Diagnosis present

## 2014-07-18 DIAGNOSIS — I1310 Hypertensive heart and chronic kidney disease without heart failure, with stage 1 through stage 4 chronic kidney disease, or unspecified chronic kidney disease: Secondary | ICD-10-CM | POA: Diagnosis present

## 2014-07-18 DIAGNOSIS — Z955 Presence of coronary angioplasty implant and graft: Secondary | ICD-10-CM | POA: Diagnosis not present

## 2014-07-18 LAB — CBC
HEMATOCRIT: 48 % (ref 39.0–52.0)
Hemoglobin: 15.2 g/dL (ref 13.0–17.0)
MCH: 29.4 pg (ref 26.0–34.0)
MCHC: 31.7 g/dL (ref 30.0–36.0)
MCV: 92.8 fL (ref 78.0–100.0)
Platelets: 124 10*3/uL — ABNORMAL LOW (ref 150–400)
RBC: 5.17 MIL/uL (ref 4.22–5.81)
RDW: 16 % — ABNORMAL HIGH (ref 11.5–15.5)
WBC: 8.1 10*3/uL (ref 4.0–10.5)

## 2014-07-18 LAB — URINE CULTURE
COLONY COUNT: NO GROWTH
CULTURE: NO GROWTH

## 2014-07-18 LAB — BASIC METABOLIC PANEL
ANION GAP: 13 (ref 5–15)
BUN: 24 mg/dL — AB (ref 6–23)
CALCIUM: 9.2 mg/dL (ref 8.4–10.5)
CO2: 25 mmol/L (ref 19–32)
CREATININE: 2.04 mg/dL — AB (ref 0.50–1.35)
Chloride: 102 mEq/L (ref 96–112)
GFR calc Af Amer: 34 mL/min — ABNORMAL LOW (ref >90)
GFR calc non Af Amer: 29 mL/min — ABNORMAL LOW (ref >90)
Glucose, Bld: 114 mg/dL — ABNORMAL HIGH (ref 70–99)
Potassium: 4.5 mmol/L (ref 3.5–5.1)
Sodium: 140 mmol/L (ref 135–145)

## 2014-07-18 LAB — TROPONIN I: TROPONIN I: 0.06 ng/mL — AB (ref <0.031)

## 2014-07-18 MED ORDER — METOPROLOL TARTRATE 1 MG/ML IV SOLN
5.0000 mg | Freq: Once | INTRAVENOUS | Status: AC
  Administered 2014-07-18: 5 mg via INTRAVENOUS
  Filled 2014-07-18 (×2): qty 5

## 2014-07-18 MED ORDER — MIRTAZAPINE 15 MG PO TABS
15.0000 mg | ORAL_TABLET | Freq: Every day | ORAL | Status: DC
  Administered 2014-07-19 – 2014-07-20 (×2): 15 mg via ORAL
  Filled 2014-07-18 (×4): qty 1

## 2014-07-18 MED ORDER — HALOPERIDOL LACTATE 5 MG/ML IJ SOLN
0.5000 mg | Freq: Once | INTRAMUSCULAR | Status: AC
  Administered 2014-07-18: 0.5 mg via INTRAVENOUS
  Filled 2014-07-18: qty 1

## 2014-07-18 MED ORDER — LORAZEPAM 2 MG/ML IJ SOLN
1.0000 mg | Freq: Once | INTRAMUSCULAR | Status: AC
  Administered 2014-07-18: 1 mg via INTRAVENOUS
  Filled 2014-07-18: qty 1

## 2014-07-18 MED ORDER — MIRABEGRON ER 50 MG PO TB24
50.0000 mg | ORAL_TABLET | Freq: Every day | ORAL | Status: DC
  Administered 2014-07-19 – 2014-07-21 (×3): 50 mg via ORAL
  Filled 2014-07-18 (×4): qty 1

## 2014-07-18 MED ORDER — TAMSULOSIN HCL 0.4 MG PO CAPS
0.4000 mg | ORAL_CAPSULE | Freq: Every day | ORAL | Status: DC
  Administered 2014-07-19 – 2014-07-21 (×3): 0.4 mg via ORAL
  Filled 2014-07-18 (×4): qty 1

## 2014-07-18 MED ORDER — LORAZEPAM 1 MG PO TABS: 1.0000 mg | ORAL_TABLET | Freq: Every day | ORAL | Status: DC

## 2014-07-18 MED ORDER — HALOPERIDOL LACTATE 5 MG/ML IJ SOLN
5.0000 mg | Freq: Once | INTRAMUSCULAR | Status: AC
  Administered 2014-07-18: 5 mg via INTRAMUSCULAR
  Filled 2014-07-18: qty 1

## 2014-07-18 MED ORDER — SODIUM CHLORIDE 0.9 % IV SOLN
INTRAVENOUS | Status: AC
  Administered 2014-07-18 – 2014-07-19 (×2): via INTRAVENOUS

## 2014-07-18 MED ORDER — HALOPERIDOL LACTATE 5 MG/ML IJ SOLN
2.0000 mg | Freq: Four times a day (QID) | INTRAMUSCULAR | Status: DC | PRN
  Administered 2014-07-18: 2 mg via INTRAVENOUS
  Filled 2014-07-18: qty 1

## 2014-07-18 MED ORDER — MAGNESIUM OXIDE 400 (241.3 MG) MG PO TABS
400.0000 mg | ORAL_TABLET | Freq: Every day | ORAL | Status: DC
Start: 2014-07-18 — End: 2014-07-21
  Administered 2014-07-19 – 2014-07-21 (×3): 400 mg via ORAL
  Filled 2014-07-18 (×4): qty 1

## 2014-07-18 NOTE — Progress Notes (Signed)
Son-in-law at bedside.  Informed about patient's situation.  Dr. Waymon Amato at bedside.  Will continue to monitor pt.

## 2014-07-18 NOTE — Progress Notes (Signed)
Pt resting well at this time. Pt gets extremely agitated and combative when woken up. BBS diminished. PT has previously stated he does not want to wear CPAP. RT will continue to monitor.

## 2014-07-18 NOTE — Progress Notes (Signed)
Md aware of pt troponin 0.06 and hr trending in the 140s, MD ordered Lopressor. Will continue to monitor.

## 2014-07-18 NOTE — Progress Notes (Signed)
Pt second troponin 0.06, paged Dr. Camila Li awaiting a callback. Attempted to get a EKG on pt, unsuccessful.  Pt became extremely agitated and combative.

## 2014-07-18 NOTE — Progress Notes (Signed)
Pt first troponin level was 0.05, BP 174/70 and heart rate 120's Dr. Camila Li aware. NO new orders given, will continue to monitor pt. Pt refusing lab to draw blood, Lab attempted 3 times. Pt extremely combative, given 1mg  Ativan and 0.5mg  Haldol and patient still combative trying to climb out of bed, sitter at bedside.

## 2014-07-18 NOTE — Progress Notes (Signed)
Pt extremely confused and trying to get out of bed, fighting the nursing staff. Security called and MD paged, Dr. Camila Li give order for 0.5mg  of Haldol and Sitter at bedside. Will continue to monitor pt.

## 2014-07-18 NOTE — Progress Notes (Signed)
Made Dr. Camila Li aware of pt heart rate trending in the 130s and pt still extremely agitated and combative trying to climb out of bed with sitter in room. Dr. Camila Li ordered 1mg  of ativan and EKG.

## 2014-07-18 NOTE — Progress Notes (Signed)
Pt extremely agitated and confused. Charge nurse found pt with IV pole in the bed, tired to remove IV pole, pt became combative, trying to hit nurse staff with IV pole. Security was called and removed IV pole from pt. Skin tear on left hand. MD paged and aware, order 1 mg of ativan. Will continue to monitor.

## 2014-07-18 NOTE — Progress Notes (Signed)
Informed by Ginger, RN that restraints had to be applied to pt since he removed his IV and was becoming increasingly aggressive towards staff and a harm to self.  Attempted to reorient pt, but pt remained combative and confused.  Paged Dr. Waymon Amato about patient's situation.  Dr. Waymon Amato to put in orders.  Will continue to monitor pt.

## 2014-07-18 NOTE — Progress Notes (Signed)
PROGRESS NOTE    Mason SpragueBilly [name] XBJ:[id number] DOB: 05/08/1933 DOA: 07/17/2014 PCP: Rene PaciValerie Leschber, MD  HPI/Brief narrative 80 year old male patient with history of morbid obesity, OSA/OHS, not fully compliant with nightly C Pap, stage III chronic kidney disease, chronic diastolic CHF, moderate aortic stenosis, CAD, MSSA pericarditis status post pericardial window, anxiety, depression, gout and possible dementia, who was recently hospitalized in November for acute hypoxic & hypercapneic respiratory failure related to volume overload and rhinovirus and discharged to SNF and returned home thereafter, recently treated outpatient for urinary tract infection with Bactrim, and again hospitalized between 07/13/14-07/16/14 for altered mental status, sinus bradycardia in the 50s and was assessed as multifactorial acute on chronic hypercapnic and hypercarbic respiratory failure, acute on chronic diastolic CHF and ileus. He was aggressively diuresed (-7.4 L) and discharged on Lasix. Metoprolol had been discontinued. He was discharged to SNF and returned within 24 hours with worsening confusion, agitation, disorientation, hallucinations, decreased oral intake and progressive weakness. Workup in ED showed PCO2 of 40.5 on ABG, urinalysis and chest x-ray without features of infection or acute findings.   Assessment/Plan: 1.  Acute encephalopathy: Possibly secondary to acute medical illness (dehydration, acute on chronic kidney disease), recent hospitalization and SNF admission (hospital delirium) complicating underlying progressively worsening dementia. As per family, patient had issues with memory impairment and intermittent hallucinations over the last 1 year, more so in the last 1-2 months. No clinical focus of sepsis. Chest x-ray and UA unremarkable. Patient was extremely agitated 12/26 AM and was at risk to hurting himself. He had pulled out his IV line. Treated with a dose of IM Haldol 5 mg and patient  calmed down. Temperately on safety restraints. Continue Recruitment consultantsafety sitter. Treat renal failure, minimize pain medications, try to achieve sleep rhythm and monitor closely. 2. Acute on stage III chronic kidney disease: Baseline creatinine probably in the 1.3-1.4 range. Currently worsened secondary to over diuresis and dehydration from poor oral intake. Brief gentle IV fluids and follow BMP. 3. Mildly elevated troponin: Possibly related to acute renal failure, tachycardia and some demand ischemia. EKG without acute findings. 4. Recently treated acute on chronic diastolic CHF: Clinically dehydrated at this time. Brief and gentle IV fluids while we hold Lasix. Recent echo 11/16 showed LVEF 50-55 percent and moderate LVH. 5. Moderate aortic stenosis: 6. History of OSA/OHS bedtime: CPAP at bedtime as tolerated. 7. Thrombocytopenia: Unclear etiology but stable since recent discharge. 8. History of gout 9. Sinus tachycardia: Possibly related to agitation and dehydration. Treat underlying cause and monitor on telemetry. 10. Dementia with behavioral changes/acute psychosis: Management per problem #1. If patient does not settle with treatment of medical conditions, may in sitter psychiatry consultation.   Code Status:  Full Family Communication:  Discussed with patient's 2 daughters, son and brother-in-law at bedside. Disposition Plan:  Return to SNF when medically stable.   Consultants:   None  Procedures:   None  Antibiotics:   None   Subjective:  Extreme agitation last night and early this morning, patient pulled out IV lines, threatening to with staff and actively hallucinating. Settled after a dose of IM Haldol.  Objective: Filed Vitals:   07/17/14 1645 07/17/14 1722 07/17/14 2110 07/18/14 0448  BP: 139/89 143/73 174/70 134/92  Pulse: 108 109 103   Temp:  97.5 F (36.4 C)    TempSrc:  Oral    Resp:  18 16 25   Height:  5\' 9"  (1.753 m)    Weight:  107.321 kg (236 lb 9.6 oz)  SpO2:  93% 96% 94%     Intake/Output Summary (Last 24 hours) at 07/18/14 1126 Last data filed at 07/17/14 1847  Gross per 24 hour  Intake      0 ml  Output    300 ml  Net   -300 ml   Filed Weights   07/17/14 1241 07/17/14 1722  Weight: 107.502 kg (237 lb) 107.321 kg (236 lb 9.6 oz)     Exam:  General exam:  Moderately built and obese male seen extremely agitated despite 4 point restraint, trying to pull at things and attempt to get out of bed. Respiratory system: Clear. No increased work of breathing. Cardiovascular system: S1 & S2 heard,  regular tachycardic No JVD, murmurs, gallops, clicks or pedal edema. Telemetry: Sinus tachycardia in the 120s. Gastrointestinal system: Abdomen is nondistended, soft and nontender. Normal bowel sounds heard. Central nervous system: Alert but not oriented. No focal neurological deficits. Extremities: Symmetric 5 x 5 power.   Data Reviewed: Basic Metabolic Panel:  Recent Labs Lab 07/14/14 0133 07/15/14 0325 07/15/14 0333 07/16/14 0435 07/17/14 1300 07/17/14 1810 07/18/14 0515  NA 140 138  --  137 138  --  140  K 4.5 4.5  --  4.4 4.4  --  4.5  CL 103 96  --  93* 96  --  102  CO2 32 36*  --  36* 26  --  25  GLUCOSE 167* 125*  --  110* 141*  --  114*  BUN 32* 29*  --  25* 28*  --  24*  CREATININE 1.99* 1.72*  --  1.71* 2.13* 2.19* 2.04*  CALCIUM 8.5 9.1  --  9.1 9.4  --  9.2  MG  --   --  2.3 2.4  --   --   --    Liver Function Tests:  Recent Labs Lab 07/13/14 1928 07/14/14 0133 07/15/14 0325 07/16/14 0435 07/17/14 1300  AST 24 23 21 20 27   ALT 25 25 23 17 17   ALKPHOS 111 95 103 98 102  BILITOT 0.3 0.4 0.8 1.1 0.8  PROT 7.5 6.8 7.5 6.9 7.6  ALBUMIN 3.6 3.5 3.7 3.4* 3.8   No results for input(s): LIPASE, AMYLASE in the last 168 hours. No results for input(s): AMMONIA in the last 168 hours. CBC:  Recent Labs Lab 07/13/14 1928 07/14/14 0133 07/15/14 0333 07/17/14 1300 07/17/14 1810 07/18/14 0515  WBC 5.3 5.6 7.5 6.7 6.7  8.1  NEUTROABS 3.1  --  5.6 4.0  --   --   HGB 13.5 12.8* 14.2 15.5 14.8 15.2  HCT 43.7 42.5 46.4 48.2 46.4 48.0  MCV 93.4 93.6 95.7 90.8 91.2 92.8  PLT 109* 106* 107* 138* 114* 124*   Cardiac Enzymes:  Recent Labs Lab 07/14/14 0740 07/14/14 1248 07/17/14 1300 07/17/14 1810 07/18/14 0515  TROPONINI <0.03 <0.03 0.05* 0.05* 0.06*   BNP (last 3 results)  Recent Labs  05/28/14 1508 07/10/14 0801 07/13/14 1928  PROBNP 477.3* 422.7 814.6*   CBG:  Recent Labs Lab 07/15/14 1238 07/15/14 1629 07/15/14 2112 07/16/14 0556 07/16/14 1112  GLUCAP 119* 173* 127* 113* 114*    Recent Results (from the past 240 hour(s))  Urine culture     Status: None   Collection Time: 07/10/14 10:25 AM  Result Value Ref Range Status   Specimen Description URINE, RANDOM  Final   Special Requests NONE  Final   Culture  Setup Time   Final    07/10/2014 21:22 Performed at  [facility] Count   Final    35,000 COLONIES/ML Performed at [facility]   Final    Multiple bacterial morphotypes present, none predominant. Suggest appropriate recollection if clinically indicated. Performed at [facility]    Report Status 07/11/2014 FINAL  Final  MRSA PCR Screening     Status: None   Collection Time: 07/14/14  6:01 AM  Result Value Ref Range Status   MRSA by PCR NEGATIVE NEGATIVE Final    Comment:        The GeneXpert MRSA Assay (FDA approved for NASAL specimens only), is one component of a comprehensive MRSA colonization surveillance program. It is not intended to diagnose MRSA infection nor to guide or monitor treatment for MRSA infections.   Culture, blood (routine x 2)     Status: None (Preliminary result)   Collection Time: 07/14/14  7:40 AM  Result Value Ref Range Status   Specimen Description BLOOD RIGHT ANTECUBITAL  Final   Special Requests BOTTLES DRAWN AEROBIC ONLY 5CC  Final   Culture  Setup Time   Final    07/14/2014  15:08 Performed at [facility]    Culture   Final           BLOOD CULTURE RECEIVED NO GROWTH TO DATE CULTURE WILL BE HELD FOR 5 DAYS BEFORE ISSUING A FINAL NEGATIVE REPORT Performed at [facility]    Report Status PENDING  Incomplete  Culture, blood (routine x 2)     Status: None (Preliminary result)   Collection Time: 07/14/14  7:45 AM  Result Value Ref Range Status   Specimen Description BLOOD RIGHT HAND  Final   Special Requests BOTTLES DRAWN AEROBIC ONLY 10CC  Final   Culture  Setup Time   Final    07/14/2014 15:08 Performed at [facility]    Culture   Final           BLOOD CULTURE RECEIVED NO GROWTH TO DATE CULTURE WILL BE HELD FOR 5 DAYS BEFORE ISSUING A FINAL NEGATIVE REPORT Performed at [facility]    Report Status PENDING  Incomplete         Studies: Dg Chest Portable 1 View  07/17/2014   CLINICAL DATA:  Altered mental status.  EXAM: PORTABLE CHEST - 1 VIEW  COMPARISON:  Single view of the chest 07/13/2014.  FINDINGS: The lungs are clear. Heart size is normal. No pneumothorax or pleural effusion. Degenerative change about the shoulders is noted.  IMPRESSION: No acute disease.   Electronically Signed   By: Drusilla Kanner M.D.   On: 07/17/2014 13:31        Scheduled Meds: . allopurinol  300 mg Oral Daily  . aspirin  81 mg Oral Daily  . buPROPion  150 mg Oral Daily  . clopidogrel  75 mg Oral Daily  . furosemide  80 mg Oral Daily  . heparin  5,000 Units Subcutaneous 3 times per day  . ipratropium-albuterol  3 mL Nebulization TID  . pantoprazole  40 mg Oral BID  . pregabalin  100 mg Oral BID  . rOPINIRole  4 mg Oral QHS  . sodium chloride  3 mL Intravenous Q12H   Continuous Infusions: . sodium chloride      Principal Problem:   Encephalopathy acute Active Problems:   Type 2 diabetes mellitus with vascular disease   Obesity (BMI 30-39.9)   CKD (chronic kidney disease), stage III  AKI (acute kidney injury)    Hypertension   Aortic stenosis   Obesity hypoventilation syndrome    Time spent:  50 minutes    HONGALGI,ANAND, MD, FACP, FHM. [facility] Pager [phone]  If 7PM-7AM, please contact night-coverage [email or web address] Password [email or web address] 07/18/2014, 11:26 AM    LOS: 1 day

## 2014-07-18 NOTE — Progress Notes (Signed)
UR completed 

## 2014-07-19 DIAGNOSIS — N189 Chronic kidney disease, unspecified: Secondary | ICD-10-CM

## 2014-07-19 LAB — BRAIN NATRIURETIC PEPTIDE: B Natriuretic Peptide: 102.7 pg/mL — ABNORMAL HIGH (ref 0.0–100.0)

## 2014-07-19 LAB — CBC
HEMATOCRIT: 46.7 % (ref 39.0–52.0)
HEMOGLOBIN: 14.3 g/dL (ref 13.0–17.0)
MCH: 29.1 pg (ref 26.0–34.0)
MCHC: 30.6 g/dL (ref 30.0–36.0)
MCV: 94.9 fL (ref 78.0–100.0)
Platelets: 126 10*3/uL — ABNORMAL LOW (ref 150–400)
RBC: 4.92 MIL/uL (ref 4.22–5.81)
RDW: 15.9 % — ABNORMAL HIGH (ref 11.5–15.5)
WBC: 7.1 10*3/uL (ref 4.0–10.5)

## 2014-07-19 LAB — BASIC METABOLIC PANEL
Anion gap: 8 (ref 5–15)
BUN: 24 mg/dL — AB (ref 6–23)
CALCIUM: 8.9 mg/dL (ref 8.4–10.5)
CO2: 29 mmol/L (ref 19–32)
Chloride: 102 mEq/L (ref 96–112)
Creatinine, Ser: 1.71 mg/dL — ABNORMAL HIGH (ref 0.50–1.35)
GFR calc Af Amer: 42 mL/min — ABNORMAL LOW (ref >90)
GFR, EST NON AFRICAN AMERICAN: 36 mL/min — AB (ref >90)
Glucose, Bld: 103 mg/dL — ABNORMAL HIGH (ref 70–99)
Potassium: 4.7 mmol/L (ref 3.5–5.1)
SODIUM: 139 mmol/L (ref 135–145)

## 2014-07-19 LAB — AMMONIA: Ammonia: 12 umol/L (ref 11–32)

## 2014-07-19 MED ORDER — IPRATROPIUM-ALBUTEROL 0.5-2.5 (3) MG/3ML IN SOLN
3.0000 mL | Freq: Two times a day (BID) | RESPIRATORY_TRACT | Status: DC
  Administered 2014-07-20: 3 mL via RESPIRATORY_TRACT
  Filled 2014-07-19 (×2): qty 3

## 2014-07-19 MED ORDER — SODIUM CHLORIDE 0.9 % IV SOLN
INTRAVENOUS | Status: DC
  Administered 2014-07-19: via INTRAVENOUS

## 2014-07-19 MED ORDER — LORAZEPAM 1 MG PO TABS
1.0000 mg | ORAL_TABLET | Freq: Every day | ORAL | Status: DC
  Administered 2014-07-19 – 2014-07-21 (×3): 1 mg via ORAL
  Filled 2014-07-19 (×3): qty 1

## 2014-07-19 MED ORDER — ACETAMINOPHEN 325 MG PO TABS
650.0000 mg | ORAL_TABLET | Freq: Four times a day (QID) | ORAL | Status: DC | PRN
  Administered 2014-07-19: 650 mg via ORAL
  Filled 2014-07-19: qty 2

## 2014-07-19 NOTE — Progress Notes (Signed)
Pt resting and arousable; calm and cooperative.  Oriented x4 and answered questions appropriately and followed commands.  Did not attempt to swing or kick at staff and did not try to remove IV.   Removed restraints.  Will continue to monitor.

## 2014-07-19 NOTE — Progress Notes (Signed)
Physical Therapy Evaluation Patient Details Name: Mason SpragueBilly [name] MRN: [id number] DOB: August 06, 1932 Today's Date: 07/19/2014   History of Present Illness  Pt admitted with SOB and AMS after recent admit 12/21 through 07/16/14 For CHF exacerbation. Admission dx encephalopathy, on 4L O2 on evaluation.   Clinical Impression  Patient reported to have been combative overnight, but appears compliant at this time.  Shoulder pain likely from fighting with staff overnight per family.  Patient presents with need for MIN assist overall using RW, mostly for O2 and IV line management.  Low activity tolerance and need for supplemental O2 at this time.  With activity, patient maintaining 93-95% on 4L O2.  Patient is appropriate for skilled PT services at this time for cardiopulmonary strengthening, as well as mild strength, gait (including stairs), and balance training.  Patient will be placed on PT roster, but is able to transition to decreased level of care when medically appropriate.    Follow Up Recommendations Supervision for mobility/OOB;SNF;CIR    Equipment Recommendations  Other (comment) (shower chair)    Recommendations for Other Services OT consult;Rehab consult     Precautions / Restrictions Precautions Precautions: Fall Restrictions Weight Bearing Restrictions: No      Mobility  Bed Mobility Overal bed mobility: Needs Assistance Bed Mobility: Supine to Sit     Supine to sit: Min assist     General bed mobility comments: HOB 20 degrees with min assist to fully elevate trunk from surface and scoot to EOB  Transfers Overall transfer level: Needs assistance Equipment used: Rolling walker (2 wheeled)   Sit to Stand: Min guard Stand pivot transfers: Min assist          Ambulation/Gait Ambulation/Gait assistance: Supervision Ambulation Distance (Feet): 150 Feet Assistive device: Rolling walker (2 wheeled) Gait Pattern/deviations: Step-through pattern;Decreased stride  length;Trunk flexed     General Gait Details: Patient with quick gait, decreased insight/awareness of O2 and IV attachments.  Stairs            Wheelchair Mobility    Modified Rankin (Stroke Patients Only)       Balance Overall balance assessment: Needs assistance   Sitting balance-Leahy Scale: Good       Standing balance-Leahy Scale: Fair                               Pertinent Vitals/Pain Pain Assessment: 0-10 Pain Score: 3  Pain Location: Shoulders Pain Descriptors / Indicators: Aching;Sore Pain Intervention(s): Repositioned;Monitored during session (pain improved with position change)    Home Living Family/patient expects to be discharged to:: Skilled nursing facility Living Arrangements: Alone Available Help at Discharge: Family;Available PRN/intermittently (son lives in his backyard per pt, daughter next door) Type of Home: House Home Access: Stairs to enter Entrance Stairs-Rails: Right Entrance Stairs-Number of Steps: 3 Home Layout: One level Home Equipment: Environmental consultantWalker - 2 wheels;Cane - single point;Hospital bed;Bedside commode;Tub bench (lift chair) Additional Comments: pt states equipment was left over from his wife     Prior Function Level of Independence: Needs assistance   Gait / Transfers Assistance Needed: pt states he doesn't use any of the equipment at home but has had several falls  ADL's / Homemaking Assistance Needed: kids do the cleaning and he eats out but doesn't cook, difficulty managing buttons, opening containers and jars and writitng due to joint limitations in hands  Comments: son stays at night, dgtr next door during the day     Hand  Dominance   Dominant Hand: Right    Extremity/Trunk Assessment     RUE Deficits / Details: longstanding limitations in joints of hand     LUE Deficits / Details: longstanding joint limitations in hand   Lower Extremity Assessment: Generalized weakness      Cervical / Trunk  Assessment: Kyphotic  Communication   Communication: No difficulties  Cognition Arousal/Alertness: Awake/alert Behavior During Therapy: WFL for tasks assessed/performed Overall Cognitive Status: Impaired/Different from baseline Area of Impairment: Safety/judgement         Safety/Judgement: Decreased awareness of safety;Decreased awareness of deficits          General Comments      Exercises        Assessment/Plan    PT Assessment Patient needs continued PT services  PT Diagnosis Difficulty walking   PT Problem List Decreased cognition;Decreased balance;Decreased mobility;Decreased safety awareness;Decreased activity tolerance;Obesity  PT Treatment Interventions Gait training;Stair training;Functional mobility training;Therapeutic activities;Therapeutic exercise;Balance training;Patient/family education;DME instruction   PT Goals (Current goals can be found in the Care Plan section) Acute Rehab PT Goals Patient Stated Goal: return home PT Goal Formulation: With patient Time For Goal Achievement: 08/02/14 Potential to Achieve Goals: Good    Frequency Min 3X/week   Barriers to discharge Decreased caregiver support      Co-evaluation               End of Session Equipment Utilized During Treatment: Gait belt;Oxygen Activity Tolerance: Patient tolerated treatment well Patient left: in chair;with call bell/phone within reach;with family/visitor present Nurse Communication: Mobility status;Precautions         Time: 1115-1150 PT Time Calculation (min) (ACUTE ONLY): 35 min   Charges:   PT Evaluation $Initial PT Evaluation Tier I: 1 Procedure PT Treatments $Gait Training: 8-22 mins $Therapeutic Activity: 8-22 mins   PT G Codes:        Celestia Duva L 2014-08-07, 12:07 PM

## 2014-07-19 NOTE — Progress Notes (Signed)
Pt does not wish to wear CPAP tonight. Pt states he is supposed to wear at home but does not. No distress noted. Pt resting comfortably.

## 2014-07-19 NOTE — Progress Notes (Signed)
PROGRESS NOTE    [name] BMW:[id number] DOB: 02-25-1933 DOA: 07/17/2014 PCP: [name], MD  HPI/Brief narrative 80 year old male patient with history of morbid obesity, OSA/OHS, not fully compliant with nightly C Pap, stage III chronic kidney disease, chronic diastolic CHF, moderate aortic stenosis, CAD, MSSA pericarditis status post pericardial window, anxiety, depression, gout and possible dementia, who was recently hospitalized in November for acute hypoxic & hypercapneic respiratory failure related to volume overload and rhinovirus and discharged to SNF and returned home thereafter, recently treated outpatient for urinary tract infection with Bactrim, and again hospitalized between 07/13/14-07/16/14 for altered mental status, sinus bradycardia in the 50s and was assessed as multifactorial acute on chronic hypercapnic and hypercarbic respiratory failure, acute on chronic diastolic CHF and ileus. He was aggressively diuresed (-7.4 L) and discharged on Lasix. Metoprolol had been discontinued. He was discharged to SNF and returned within 24 hours with worsening confusion, agitation, disorientation, hallucinations, decreased oral intake and progressive weakness. Workup in ED showed PCO2 of 40.5 on ABG, urinalysis and chest x-ray without features of infection or acute findings.   Assessment/Plan: 1.  Acute encephalopathy: Possibly secondary to acute medical illness (dehydration, acute on chronic kidney disease), recent hospitalization and SNF admission (hospital delirium) complicating underlying progressively worsening dementia. As per family, patient had issues with memory impairment and intermittent hallucinations over the last 1 year, more so in the last 1-2 months. No clinical focus of sepsis. Chest x-ray and UA unremarkable. Patient was extremely agitated 12/26 AM and was at risk to hurting himself. He had pulled out his IV line. Treated with a dose of IM Haldol 5 mg and patient  calmed down. Treat renal failure, minimize pain medications, try to achieve sleep rhythm and monitor closely. Significantly improved 12/27. Restraints removed. Monitor closely. 2. Acute on stage III chronic kidney disease: Baseline creatinine probably in the 1.3-1.4 range. Currently worsened secondary to over diuresis and dehydration from poor oral intake. Brief gentle IV fluids and follow BMP. Improving. Continue additional 24 hours of gentle IV hydration. 3. Mildly elevated troponin: Possibly related to acute renal failure, tachycardia and some demand ischemia. EKG without acute findings. 4. Recently treated acute on chronic diastolic CHF: Clinically dehydrated at this time. Brief and gentle IV fluids while we hold Lasix. Recent echo 11/16 showed LVEF 50-55 percent and moderate LVH. 5. Moderate aortic stenosis: 6. History of OSA/OHS bedtime: CPAP at bedtime as tolerated. 7. Thrombocytopenia: Unclear etiology but stable since recent discharge. 8. History of gout 9. Sinus tachycardia: Possibly related to agitation and dehydration. Resolved. 10. Dementia with behavioral changes/acute psychosis: Management per problem #1. Improved agitation  Code Status:  Full Family Communication:  Discussed with patient's daughter and brother-in-law at bedside. Disposition Plan:  Return to SNF when medically stable.   Consultants:   None  Procedures:   None  Antibiotics:   None   Subjective: Patient is coherent this morning. Complains of some left shoulder soreness but denies chest pain or dyspnea. Denies any other complaints. As per nursing and family, patient is calm, coherent and cooperative.  Objective: Filed Vitals:   07/18/14 2240 07/19/14 0500 07/19/14 0700 07/19/14 0758  BP: 152/55 168/50 143/50   Pulse: 110 100 94   Temp: 97.9 F (36.6 C) 98.1 F (36.7 C)    TempSrc: Oral Axillary    Resp: 20 18    Height:      Weight:      SpO2: 93% 96% 98% 97%    Intake/Output Summary (Last 24  hours) at 07/19/14 1339 Last data filed at 07/19/14 1302  Gross per 24 hour  Intake    483 ml  Output    400 ml  Net     83 ml   Filed Weights   07/17/14 1241 07/17/14 1722  Weight: 107.502 kg (237 lb) 107.321 kg (236 lb 9.6 oz)     Exam:  General exam:  Moderately built and obese male lying comfortably in bed. Looks much improved compared to 12/26.  Respiratory system: Clear. No increased work of breathing. Cardiovascular system: S1 & S2 heard, RRR. No JVD, murmurs, gallops, clicks or pedal edema. Telemetry: SR. Gastrointestinal system: Abdomen is nondistended, soft and nontender. Normal bowel sounds heard. Central nervous system: Alert and oriented 4. No focal neurological deficits. Extremities: Symmetric 5 x 5 power.   Data Reviewed: Basic Metabolic Panel:  Recent Labs Lab 07/15/14 0325 07/15/14 0333 07/16/14 0435 07/17/14 1300 07/17/14 1810 07/18/14 0515 07/19/14 0555  NA 138  --  137 138  --  140 139  K 4.5  --  4.4 4.4  --  4.5 4.7  CL 96  --  93* 96  --  102 102  CO2 36*  --  36* 26  --  25 29  GLUCOSE 125*  --  110* 141*  --  114* 103*  BUN 29*  --  25* 28*  --  24* 24*  CREATININE 1.72*  --  1.71* 2.13* 2.19* 2.04* 1.71*  CALCIUM 9.1  --  9.1 9.4  --  9.2 8.9  MG  --  2.3 2.4  --   --   --   --    Liver Function Tests:  Recent Labs Lab 07/13/14 1928 07/14/14 0133 07/15/14 0325 07/16/14 0435 07/17/14 1300  AST 24 23 21 20 27   ALT 25 25 23 17 17   ALKPHOS 111 95 103 98 102  BILITOT 0.3 0.4 0.8 1.1 0.8  PROT 7.5 6.8 7.5 6.9 7.6  ALBUMIN 3.6 3.5 3.7 3.4* 3.8   No results for input(s): LIPASE, AMYLASE in the last 168 hours.  Recent Labs Lab 07/19/14 0555  AMMONIA 12   CBC:  Recent Labs Lab 07/13/14 1928  07/15/14 0333 07/17/14 1300 07/17/14 1810 07/18/14 0515 07/19/14 0555  WBC 5.3  < > 7.5 6.7 6.7 8.1 7.1  NEUTROABS 3.1  --  5.6 4.0  --   --   --   HGB 13.5  < > 14.2 15.5 14.8 15.2 14.3  HCT 43.7  < > 46.4 48.2 46.4 48.0 46.7  MCV  93.4  < > 95.7 90.8 91.2 92.8 94.9  PLT 109*  < > 107* 138* 114* 124* 126*  < > = values in this interval not displayed. Cardiac Enzymes:  Recent Labs Lab 07/14/14 0740 07/14/14 1248 07/17/14 1300 07/17/14 1810 07/18/14 0515  TROPONINI <0.03 <0.03 0.05* 0.05* 0.06*   BNP (last 3 results)  Recent Labs  05/28/14 1508 07/10/14 0801 07/13/14 1928  PROBNP 477.3* 422.7 814.6*   CBG:  Recent Labs Lab 07/15/14 1238 07/15/14 1629 07/15/14 2112 07/16/14 0556 07/16/14 1112  GLUCAP 119* 173* 127* 113* 114*    Recent Results (from the past 240 hour(s))  Urine culture     Status: None   Collection Time: 07/10/14 10:25 AM  Result Value Ref Range Status   Specimen Description URINE, RANDOM  Final   Special Requests NONE  Final   Culture  Setup Time   Final    07/10/2014 21:22 Performed  at [facility]   Final    35,000 COLONIES/ML Performed at [facility]    Culture   Final    Multiple bacterial morphotypes present, none predominant. Suggest appropriate recollection if clinically indicated. Performed at [facility]    Report Status 07/11/2014 FINAL  Final  MRSA PCR Screening     Status: None   Collection Time: 07/14/14  6:01 AM  Result Value Ref Range Status   MRSA by PCR NEGATIVE NEGATIVE Final    Comment:        The GeneXpert MRSA Assay (FDA approved for NASAL specimens only), is one component of a comprehensive MRSA colonization surveillance program. It is not intended to diagnose MRSA infection nor to guide or monitor treatment for MRSA infections.   Culture, blood (routine x 2)     Status: None (Preliminary result)   Collection Time: 07/14/14  7:40 AM  Result Value Ref Range Status   Specimen Description BLOOD RIGHT ANTECUBITAL  Final   Special Requests BOTTLES DRAWN AEROBIC ONLY 5CC  Final   Culture  Setup Time   Final    07/14/2014 15:08 Performed at [facility]    Culture   Final           BLOOD  CULTURE RECEIVED NO GROWTH TO DATE CULTURE WILL BE HELD FOR 5 DAYS BEFORE ISSUING A FINAL NEGATIVE REPORT Performed at [facility]    Report Status PENDING  Incomplete  Culture, blood (routine x 2)     Status: None (Preliminary result)   Collection Time: 07/14/14  7:45 AM  Result Value Ref Range Status   Specimen Description BLOOD RIGHT HAND  Final   Special Requests BOTTLES DRAWN AEROBIC ONLY 10CC  Final   Culture  Setup Time   Final    07/14/2014 15:08 Performed at [facility]    Culture   Final           BLOOD CULTURE RECEIVED NO GROWTH TO DATE CULTURE WILL BE HELD FOR 5 DAYS BEFORE ISSUING A FINAL NEGATIVE REPORT Performed at [facility]    Report Status PENDING  Incomplete  Blood Culture (routine x 2)     Status: None (Preliminary result)   Collection Time: 07/17/14 12:15 PM  Result Value Ref Range Status   Specimen Description BLOOD RIGHT ARM  Final   Special Requests BOTTLES DRAWN AEROBIC AND ANAEROBIC  Final   Culture   Final           BLOOD CULTURE RECEIVED NO GROWTH TO DATE CULTURE WILL BE HELD FOR 5 DAYS BEFORE ISSUING A FINAL NEGATIVE REPORT Performed at [facility]    Report Status PENDING  Incomplete  Blood Culture (routine x 2)     Status: None (Preliminary result)   Collection Time: 07/17/14  1:25 PM  Result Value Ref Range Status   Specimen Description BLOOD RIGHT HAND  Final   Special Requests BOTTLES DRAWN AEROBIC AND ANAEROBIC  Final   Culture   Final           BLOOD CULTURE RECEIVED NO GROWTH TO DATE CULTURE WILL BE HELD FOR 5 DAYS BEFORE ISSUING A FINAL NEGATIVE REPORT Performed at [facility]    Report Status PENDING  Incomplete  Urine culture     Status: None   Collection Time: 07/17/14  1:36 PM  Result Value Ref Range Status   Specimen Description URINE, CATHETERIZED  Final  Special Requests NONE  Final   Colony Count NO GROWTH Performed at [facility]   Final   Culture NO  GROWTH Performed at [facility]   Final   Report Status 07/18/2014 FINAL  Final         Studies: No results found.      Scheduled Meds: . allopurinol  300 mg Oral Daily  . aspirin  81 mg Oral Daily  . buPROPion  150 mg Oral Daily  . clopidogrel  75 mg Oral Daily  . heparin  5,000 Units Subcutaneous 3 times per day  . ipratropium-albuterol  3 mL Nebulization BID  . LORazepam  1 mg Oral QHS  . magnesium oxide  400 mg Oral Daily  . mirabegron ER  50 mg Oral Daily  . mirtazapine  15 mg Oral QHS  . pantoprazole  40 mg Oral BID  . pregabalin  100 mg Oral BID  . rOPINIRole  4 mg Oral QHS  . sodium chloride  3 mL Intravenous Q12H  . tamsulosin  0.4 mg Oral Daily   Continuous Infusions:    Principal Problem:   Encephalopathy acute Active Problems:   Type 2 diabetes mellitus with vascular disease   Obesity (BMI 30-39.9)   CKD (chronic kidney disease), stage III   AKI (acute kidney injury)   Hypertension   Aortic stenosis   Obesity hypoventilation syndrome    Time spent:  30 minutes    Hurshel Bouillon, MD, FACP, FHM. [facility] Pager [phone]  If 7PM-7AM, please contact night-coverage [email or web address] Password [email or web address] 07/19/2014, 1:39 PM    LOS: 2 days

## 2014-07-20 LAB — CULTURE, BLOOD (ROUTINE X 2)
CULTURE: NO GROWTH
Culture: NO GROWTH

## 2014-07-20 LAB — BASIC METABOLIC PANEL
Anion gap: 7 (ref 5–15)
BUN: 22 mg/dL (ref 6–23)
CO2: 30 mmol/L (ref 19–32)
CREATININE: 1.31 mg/dL (ref 0.50–1.35)
Calcium: 9 mg/dL (ref 8.4–10.5)
Chloride: 103 mEq/L (ref 96–112)
GFR calc Af Amer: 58 mL/min — ABNORMAL LOW (ref >90)
GFR calc non Af Amer: 50 mL/min — ABNORMAL LOW (ref >90)
Glucose, Bld: 95 mg/dL (ref 70–99)
Potassium: 5.1 mmol/L (ref 3.5–5.1)
Sodium: 140 mmol/L (ref 135–145)

## 2014-07-20 MED ORDER — IPRATROPIUM-ALBUTEROL 0.5-2.5 (3) MG/3ML IN SOLN: 3.0000 mL | Freq: Four times a day (QID) | RESPIRATORY_TRACT | Status: DC | PRN

## 2014-07-20 NOTE — Progress Notes (Signed)
Physical Therapy Treatment Patient Details Name: Mason SpragueBilly [name] MRN: [id number] DOB: October 14, 1932 Today's Date: 07/20/2014    History of Present Illness Pt admitted with SOB and AMS after recent admit 12/21 through 07/16/14 For CHF exacerbation. Admission dx encephalopathy, on 4L O2 on evaluation.     PT Comments    Pt is planning to return to SNF where he had been sent prior to his readmission.  Has previously been home and plans to return there but is demonstrating some elements of confusion and should be carefully monitored.    Follow Up Recommendations  SNF     Equipment Recommendations  None recommended by PT (await SNF completion)    Recommendations for Other Services       Precautions / Restrictions Precautions Precautions: Fall Restrictions Weight Bearing Restrictions: No    Mobility  Bed Mobility               General bed mobility comments: up when PT arrived  Transfers Overall transfer level: Needs assistance Equipment used: Rolling walker (2 wheeled) Transfers: Sit to/from UGI CorporationStand;Stand Pivot Transfers Sit to Stand: Mod assist (from lower chair) Stand pivot transfers: Min assist (reminders about sequence and safety)       General transfer comment: pt forgets hand placemetn and sequence without reminders  Ambulation/Gait Ambulation/Gait assistance: Min guard Ambulation Distance (Feet): 200 Feet Assistive device: Rolling walker (2 wheeled) Gait Pattern/deviations: Step-through pattern;Decreased stride length;Drifts right/left;Wide base of support Gait velocity: fast Gait velocity interpretation: at or above normal speed for age/gender General Gait Details: Patient with quick gait, decreased insight/awareness of O2 and IV attachments.   Stairs            Wheelchair Mobility    Modified Rankin (Stroke Patients Only)       Balance Overall balance assessment: Needs assistance Sitting-balance support: Feet supported Sitting balance-Leahy  Scale: Good   Postural control: Posterior lean Standing balance support: Bilateral upper extremity supported Standing balance-Leahy Scale: Fair Standing balance comment: dynamic standing poor+                    Cognition Arousal/Alertness: Awake/alert Behavior During Therapy: WFL for tasks assessed/performed Overall Cognitive Status: Impaired/Different from baseline Area of Impairment: Safety/judgement     Memory: Decreased short-term memory   Safety/Judgement: Decreased awareness of deficits;Decreased awareness of safety     General Comments: Pt feels more confident than he should about getting up and walking distances    Exercises General Exercises - Lower Extremity Ankle Circles/Pumps: AROM;Both;5 reps Quad Sets: AROM;Both;10 reps Gluteal Sets: AROM;Both;20 reps Hip ABduction/ADduction: AROM;Both;10 reps Straight Leg Raises: AROM;AAROM;Both;5 reps    General Comments General comments (skin integrity, edema, etc.): Pt needs to be monitored due to lack of awareness such as stating he doesn't need O2.  Pt on 2L O2 at home.      Pertinent Vitals/Pain Pain Assessment: No/denies pain Pain Score: 0-No pain    Home Living                      Prior Function            PT Goals (current goals can now be found in the care plan section) Acute Rehab PT Goals Patient Stated Goal: get home Progress towards PT goals: Progressing toward goals    Frequency  Min 3X/week    PT Plan Current plan remains appropriate    Co-evaluation             End  of Session Equipment Utilized During Treatment: Oxygen Activity Tolerance: Patient tolerated treatment well (SOB at end) Patient left: in chair;with call bell/phone within reach;with bed alarm set;with nursing/sitter in room     Time: 4503-8882 PT Time Calculation (min) (ACUTE ONLY): 28 min  Charges:  $Gait Training: 8-22 mins $Therapeutic Exercise: 8-22 mins                    G Codes:       Ivar Drape 07/25/14, 11:18 AM  Samul Dada, PT MS Acute Rehab Dept. Number: [phone]

## 2014-07-20 NOTE — Progress Notes (Signed)
PROGRESS NOTE    [name] ZOX:[id number] DOB: March 12, 1933 DOA: 07/17/2014 PCP: [name], MD  HPI/Brief narrative 80 year old male patient with history of morbid obesity, OSA/OHS, not fully compliant with nightly C Pap, stage III chronic kidney disease, chronic diastolic CHF, moderate aortic stenosis, CAD, MSSA pericarditis status post pericardial window, anxiety, depression, gout and possible dementia, who was recently hospitalized in November for acute hypoxic & hypercapneic respiratory failure related to volume overload and rhinovirus and discharged to SNF and returned home thereafter, recently treated outpatient for urinary tract infection with Bactrim, and again hospitalized between 07/13/14-07/16/14 for altered mental status, sinus bradycardia in the 50s and was assessed as multifactorial acute on chronic hypercapnic and hypercarbic respiratory failure, acute on chronic diastolic CHF and ileus. He was aggressively diuresed (-7.4 L) and discharged on Lasix. Metoprolol had been discontinued. He was discharged to SNF and returned within 24 hours with worsening confusion, agitation, disorientation, hallucinations, decreased oral intake and progressive weakness. Workup in ED showed PCO2 of 40.5 on ABG, urinalysis and chest x-ray without features of infection or acute findings.   Assessment/Plan: 1.  Acute encephalopathy: Possibly secondary to acute medical illness (dehydration, acute on chronic kidney disease), recent hospitalization and SNF admission (hospital delirium) complicating underlying progressively worsening dementia. As per family, patient had issues with memory impairment and intermittent hallucinations over the last 1 year, more so in the last 1-2 months. No clinical focus of sepsis. Chest x-ray and UA unremarkable. Patient was extremely agitated 12/26 AM and was at risk to hurting himself. He had pulled out his IV line. Treated with a dose of IM Haldol 5 mg and patient  calmed down. Treat renal failure, minimize pain medications, try to achieve sleep rhythm and monitor closely. Significantly improved 12/27. Restraints removed. Monitor closely. Resolved. 2. Acute on stage III chronic kidney disease: Baseline creatinine probably in the 1.3-1.4 range. Currently worsened secondary to over diuresis and dehydration from poor oral intake. Brief gentle IV fluids and follow BMP. Improving. Resolved. Creatinine 1.3. Hold diuretics for a day or 2 and then? resume at low-dose. 3. Mildly elevated troponin: Possibly related to acute renal failure, tachycardia and some demand ischemia. EKG without acute findings. 4. Recently treated acute on chronic diastolic CHF: Compensated. Hold diuretics for another day or 2 due to recent acute renal failure. Recent echo 11/16 showed LVEF 50-55 percent and moderate LVH. 5. Moderate aortic stenosis: 6. History of OSA/OHS bedtime: CPAP at bedtime as tolerated. 7. Thrombocytopenia: Unclear etiology but stable since recent discharge. 8. History of gout 9. Sinus tachycardia: Possibly related to agitation and dehydration. Resolved. 10. Dementia with behavioral changes/acute psychosis: Management per problem #1. Mental status changes resolved.  Code Status:  Full Family Communication: None at bedside. Disposition Plan:  Return to SNF when medically stable.   Consultants:   None  Procedures:   None  Antibiotics:   None   Subjective: Denies complaints.  Objective: Filed Vitals:   07/20/14 0555 07/20/14 0700 07/20/14 1350 07/20/14 1403  BP: 124/66  130/54   Pulse:   82   Temp:   98 F (36.7 C)   TempSrc:      Resp:   36 24  Height:      Weight:      SpO2:  95% 98%     Intake/Output Summary (Last 24 hours) at 07/20/14 1702 Last data filed at 07/20/14 1519  Gross per 24 hour  Intake    980 ml  Output    310 ml  Net    670 ml   Filed Weights   07/17/14 1241 07/17/14 1722  Weight: 107.502 kg (237 lb) 107.321 kg (236 lb  9.6 oz)     Exam:  General exam:  Moderately built and obese male seen ambulating comfortably with PT. Respiratory system: Clear. No increased work of breathing. Cardiovascular system: S1 & S2 heard, RRR. No JVD, murmurs, gallops, clicks or pedal edema.  Gastrointestinal system: Abdomen is nondistended, soft and nontender. Normal bowel sounds heard. Central nervous system: Alert and oriented 4. No focal neurological deficits. Extremities: Symmetric 5 x 5 power.   Data Reviewed: Basic Metabolic Panel:  Recent Labs Lab 07/15/14 0333 07/16/14 0435 07/17/14 1300 07/17/14 1810 07/18/14 0515 07/19/14 0555 07/20/14 0817  NA  --  137 138  --  140 139 140  K  --  4.4 4.4  --  4.5 4.7 5.1  CL  --  93* 96  --  102 102 103  CO2  --  36* 26  --  25 29 30   GLUCOSE  --  110* 141*  --  114* 103* 95  BUN  --  25* 28*  --  24* 24* 22  CREATININE  --  1.71* 2.13* 2.19* 2.04* 1.71* 1.31  CALCIUM  --  9.1 9.4  --  9.2 8.9 9.0  MG 2.3 2.4  --   --   --   --   --    Liver Function Tests:  Recent Labs Lab 07/13/14 1928 07/14/14 0133 07/15/14 0325 07/16/14 0435 07/17/14 1300  AST 24 23 21 20 27   ALT 25 25 23 17 17   ALKPHOS 111 95 103 98 102  BILITOT 0.3 0.4 0.8 1.1 0.8  PROT 7.5 6.8 7.5 6.9 7.6  ALBUMIN 3.6 3.5 3.7 3.4* 3.8   No results for input(s): LIPASE, AMYLASE in the last 168 hours.  Recent Labs Lab 07/19/14 0555  AMMONIA 12   CBC:  Recent Labs Lab 07/13/14 1928  07/15/14 0333 07/17/14 1300 07/17/14 1810 07/18/14 0515 07/19/14 0555  WBC 5.3  < > 7.5 6.7 6.7 8.1 7.1  NEUTROABS 3.1  --  5.6 4.0  --   --   --   HGB 13.5  < > 14.2 15.5 14.8 15.2 14.3  HCT 43.7  < > 46.4 48.2 46.4 48.0 46.7  MCV 93.4  < > 95.7 90.8 91.2 92.8 94.9  PLT 109*  < > 107* 138* 114* 124* 126*  < > = values in this interval not displayed. Cardiac Enzymes:  Recent Labs Lab 07/14/14 0740 07/14/14 1248 07/17/14 1300 07/17/14 1810 07/18/14 0515  TROPONINI <0.03 <0.03 0.05* 0.05*  0.06*   BNP (last 3 results)  Recent Labs  05/28/14 1508 07/10/14 0801 07/13/14 1928  PROBNP 477.3* 422.7 814.6*   CBG:  Recent Labs Lab 07/15/14 1238 07/15/14 1629 07/15/14 2112 07/16/14 0556 07/16/14 1112  GLUCAP 119* 173* 127* 113* 114*    Recent Results (from the past 240 hour(s))  MRSA PCR Screening     Status: None   Collection Time: 07/14/14  6:01 AM  Result Value Ref Range Status   MRSA by PCR NEGATIVE NEGATIVE Final    Comment:        The GeneXpert MRSA Assay (FDA approved for NASAL specimens only), is one component of a comprehensive MRSA colonization surveillance program. It is not intended to diagnose MRSA infection nor to guide or monitor treatment for MRSA infections.   Culture, blood (routine x 2)  Status: None   Collection Time: 07/14/14  7:40 AM  Result Value Ref Range Status   Specimen Description BLOOD RIGHT ANTECUBITAL  Final   Special Requests BOTTLES DRAWN AEROBIC ONLY 5CC  Final   Culture  Setup Time   Final    07/14/2014 15:08 Performed at [facility]    Culture   Final    NO GROWTH 5 DAYS Performed at [facility]    Report Status 07/20/2014 FINAL  Final  Culture, blood (routine x 2)     Status: None   Collection Time: 07/14/14  7:45 AM  Result Value Ref Range Status   Specimen Description BLOOD RIGHT HAND  Final   Special Requests BOTTLES DRAWN AEROBIC ONLY 10CC  Final   Culture  Setup Time   Final    07/14/2014 15:08 Performed at [facility]    Culture   Final    NO GROWTH 5 DAYS Performed at [facility]    Report Status 07/20/2014 FINAL  Final  Blood Culture (routine x 2)     Status: None (Preliminary result)   Collection Time: 07/17/14 12:15 PM  Result Value Ref Range Status   Specimen Description BLOOD RIGHT ARM  Final   Special Requests BOTTLES DRAWN AEROBIC AND ANAEROBIC  Final   Culture   Final           BLOOD CULTURE RECEIVED NO GROWTH TO DATE CULTURE WILL BE HELD FOR  5 DAYS BEFORE ISSUING A FINAL NEGATIVE REPORT Note: Culture results may be compromised due to an excessive volume of blood received in culture bottles. Performed at [facility]    Report Status PENDING  Incomplete  Blood Culture (routine x 2)     Status: None (Preliminary result)   Collection Time: 07/17/14  1:25 PM  Result Value Ref Range Status   Specimen Description BLOOD RIGHT HAND  Final   Special Requests BOTTLES DRAWN AEROBIC AND ANAEROBIC  Final   Culture   Final           BLOOD CULTURE RECEIVED NO GROWTH TO DATE CULTURE WILL BE HELD FOR 5 DAYS BEFORE ISSUING A FINAL NEGATIVE REPORT Note: Culture results may be compromised due to an excessive volume of blood received in culture bottles. Performed at [facility]    Report Status PENDING  Incomplete  Urine culture     Status: None   Collection Time: 07/17/14  1:36 PM  Result Value Ref Range Status   Specimen Description URINE, CATHETERIZED  Final   Special Requests NONE  Final   Colony Count NO GROWTH Performed at [facility]   Final   Culture NO GROWTH Performed at [facility]   Final   Report Status 07/18/2014 FINAL  Final         Studies: No results found.      Scheduled Meds: . allopurinol  300 mg Oral Daily  . aspirin  81 mg Oral Daily  . buPROPion  150 mg Oral Daily  . clopidogrel  75 mg Oral Daily  . heparin  5,000 Units Subcutaneous 3 times per day  . ipratropium-albuterol  3 mL Nebulization BID  . LORazepam  1 mg Oral Daily  . magnesium oxide  400 mg Oral Daily  . mirabegron ER  50 mg Oral Daily  . mirtazapine  15 mg Oral QHS  . pantoprazole  40 mg Oral BID  . pregabalin  100 mg Oral BID  .  rOPINIRole  4 mg Oral QHS  . sodium chloride  3 mL Intravenous Q12H  . tamsulosin  0.4 mg Oral Daily   Continuous Infusions:    Principal Problem:   Encephalopathy acute Active Problems:   Type 2 diabetes mellitus with vascular disease   Obesity (BMI 30-39.9)    CKD (chronic kidney disease), stage III   AKI (acute kidney injury)   Hypertension   Aortic stenosis   Obesity hypoventilation syndrome    Time spent:  30 minutes    Raechal Raben, MD, FACP, FHM. [facility] Pager [phone]  If 7PM-7AM, please contact night-coverage [email or web address] Password [email or web address] 07/20/2014, 5:02 PM    LOS: 3 days

## 2014-07-20 NOTE — Progress Notes (Signed)
Rehab Admissions Coordinator Note:  Patient was screened by Trish Mage for appropriateness for an Inpatient Acute Rehab Consult.  At this time, we are recommending HH or SNF.  Patient is already doing too well for acute inpatient rehab stay.  Trish Mage 07/20/2014, 9:14 AM  I can be reached at [phone].

## 2014-07-20 NOTE — Progress Notes (Signed)
Patient continues to refuse CPAP. RT will continue to assist as needed. 

## 2014-07-21 ENCOUNTER — Telehealth: Payer: Self-pay | Admitting: Pulmonary Disease

## 2014-07-21 DIAGNOSIS — I5032 Chronic diastolic (congestive) heart failure: Secondary | ICD-10-CM

## 2014-07-21 DIAGNOSIS — J9611 Chronic respiratory failure with hypoxia: Secondary | ICD-10-CM

## 2014-07-21 LAB — BASIC METABOLIC PANEL
Anion gap: 7 (ref 5–15)
BUN: 17 mg/dL (ref 6–23)
CHLORIDE: 101 meq/L (ref 96–112)
CO2: 29 mmol/L (ref 19–32)
CREATININE: 1.09 mg/dL (ref 0.50–1.35)
Calcium: 8.8 mg/dL (ref 8.4–10.5)
GFR calc Af Amer: 73 mL/min — ABNORMAL LOW (ref >90)
GFR calc non Af Amer: 63 mL/min — ABNORMAL LOW (ref >90)
Glucose, Bld: 95 mg/dL (ref 70–99)
Potassium: 4.4 mmol/L (ref 3.5–5.1)
Sodium: 137 mmol/L (ref 135–145)

## 2014-07-21 LAB — GLUCOSE, CAPILLARY: Glucose-Capillary: 92 mg/dL (ref 70–99)

## 2014-07-21 MED ORDER — FUROSEMIDE 40 MG PO TABS: 40.0000 mg | ORAL_TABLET | Freq: Every day | ORAL | Status: DC

## 2014-07-21 MED ORDER — LORAZEPAM 1 MG PO TABS: 1.0000 mg | ORAL_TABLET | Freq: Every morning | ORAL | Status: DC

## 2014-07-21 MED ORDER — ACETAMINOPHEN 325 MG PO TABS: 650.0000 mg | ORAL_TABLET | Freq: Four times a day (QID) | ORAL | Status: DC | PRN

## 2014-07-21 MED ORDER — IPRATROPIUM-ALBUTEROL 0.5-2.5 (3) MG/3ML IN SOLN: 3.0000 mL | Freq: Four times a day (QID) | RESPIRATORY_TRACT | Status: DC | PRN

## 2014-07-21 NOTE — Discharge Summary (Signed)
Physician Discharge Summary  [name] WJX:[id number] DOB: Oct 29, 1932 DOA: 07/17/2014  PCP: [name], MD  Admit date: 07/17/2014 Discharge date: 07/21/2014  Time spent: Greater than 30 minutes  Recommendations for Outpatient Follow-up:  1. Ms. Rubye Oaks, pulmonology on 07/31/14 at 2 PM 2. Dr. Othella Boyer, cardiology: SNF to arrange outpatient follow-up appointment in one week. 3. Dr. [name], PCP: Follow-up upon discharge from SNF. 4. M.D. at SNF, in 5 days with repeat labs (CBC & BMP). Please follow final blood culture results that were sent from the hospital.  Discharge Diagnoses:  Principal Problem:   Encephalopathy acute Active Problems:   Type 2 diabetes mellitus with vascular disease   Obesity (BMI 30-39.9)   CKD (chronic kidney disease), stage III   AKI (acute kidney injury)   Hypertension   Aortic stenosis   Obesity hypoventilation syndrome   Discharge Condition: Improved & Stable  Diet recommendation: Heart healthy diet.  Filed Weights   07/17/14 1241 07/17/14 1722  Weight: 107.502 kg (237 lb) 107.321 kg (236 lb 9.6 oz)    History of present illness:  80 year old male patient with history of morbid obesity, OSA/OHS, not fully compliant with nightly C Pap, stage III chronic kidney disease, chronic diastolic CHF, moderate aortic stenosis, CAD, MSSA pericarditis status post pericardial window, anxiety, depression, gout and possible dementia, who was recently hospitalized in November for acute hypoxic & hypercapneic respiratory failure related to volume overload and rhinovirus and discharged to SNF and returned home thereafter, recently treated outpatient for urinary tract infection with Bactrim, and again hospitalized between 07/13/14-07/16/14 for altered mental status, sinus bradycardia in the 50s and was assessed as multifactorial acute on chronic hypercapnic and hypercarbic respiratory failure, acute on chronic diastolic CHF and ileus. He  was aggressively diuresed (-7.4 L) and discharged on Lasix. Metoprolol had been discontinued. He was discharged to SNF and returned within 24 hours with worsening confusion, agitation, disorientation, hallucinations, decreased oral intake and progressive weakness. Workup in ED showed PCO2 of 40.5 on ABG, urinalysis and chest x-ray without features of infection or acute findings.  Hospital Course:   1. Acute encephalopathy: Possibly secondary to acute medical illness (dehydration, acute on chronic kidney disease), recent hospitalization and SNF admission (hospital delirium) complicating underlying progressively worsening dementia. As per family, patient had issues with memory impairment and intermittent hallucinations over the last 1 year, more so in the last 1-2 months. No clinical focus of sepsis. Chest x-ray and UA unremarkable. Patient was extremely agitated 12/26 AM and was at risk to hurting himself. He had pulled out his IV line. Treated with a dose of IM Haldol 5 mg and patient calmed down. Treat renal failure, minimize pain medications, try to achieve sleep rhythm and monitor closely. Resolved. 2. Acute on stage III chronic kidney disease: Baseline creatinine probably in the 1.3-1.4 range. Currently worsened secondary to over diuresis and dehydration from poor oral intake. Brief gentle IV fluids and follow BMP. Improving. Resolved. Creatinine 1.3. We'll resume Lasix at a lower dose at discharge and recommend close follow-up of BMP as outpatient. 3. Mildly elevated troponin: Possibly related to acute renal failure, tachycardia and some demand ischemia. EKG without acute findings. 4. Recently treated acute on chronic diastolic CHF: Compensated. Recent echo 11/16 showed LVEF 50-55 percent and moderate LVH. We'll resume Lasix at reduced dose at discharge. This can be adjusted at SNF as needed based on clinical follow-up and follow-up of BMP. 5. Moderate aortic stenosis: 6. History of OSA/OHS bedtime:  CPAP at  bedtime as tolerated. Patient refuses to wear C Pap on many days. 7. Thrombocytopenia: Unclear etiology but stable since recent discharge. 8. History of gout: Asymptomatic. 9. Sinus tachycardia: Possibly related to agitation and dehydration. Resolved. 10. Dementia with behavioral changes/acute psychosis: Management per problem #1. Mental status changes resolved. 11. Chronic respiratory failure: Continue home oxygen  Consultations:  None  Procedures:  None    Discharge Exam:  Complaints:  Denies complaints.  Filed Vitals:   07/20/14 1403 07/20/14 2143 07/21/14 0515 07/21/14 1318  BP:  121/37 155/65 142/63  Pulse:  82 80 81  Temp:  98.3 F (36.8 C) 97.9 F (36.6 C) 97.8 F (36.6 C)  TempSrc:  Oral Oral Oral  Resp: 24 15 18 24   Height:      Weight:      SpO2:  95% 100% 98%    General exam: Moderately built and obese male sitting up comfortably on chair Respiratory system: Clear. No increased work of breathing. Cardiovascular system: S1 & S2 heard, RRR. No JVD, murmurs, gallops, clicks or pedal edema.  Gastrointestinal system: Abdomen is nondistended, soft and nontender. Normal bowel sounds heard. Central nervous system: Alert and oriented 4. No focal neurological deficits. Extremities: Symmetric 5 x 5 power.  Discharge Instructions      Discharge Instructions    (HEART FAILURE PATIENTS) Call MD:  Anytime you have any of the following symptoms: 1) 3 pound weight gain in 24 hours or 5 pounds in 1 week 2) shortness of breath, with or without a dry hacking cough 3) swelling in the hands, feet or stomach 4) if you have to sleep on extra pillows at night in order to breathe.    Complete by:  As directed      Call MD for:    Complete by:  As directed   Mental status changes/confusion/agitation.     Diet - low sodium heart healthy    Complete by:  As directed      Discharge instructions    Complete by:  As directed   1) continue nightly CPAP at bedtime as per  prior home settings. 2) continue oxygen via nasal cannula at 2 L/m continuously.     Increase activity slowly    Complete by:  As directed             Medication List    STOP taking these medications        ondansetron 8 MG tablet  Commonly known as:  ZOFRAN     oxyCODONE-acetaminophen 10-325 MG per tablet  Commonly known as:  PERCOCET     ranitidine 150 MG tablet  Commonly known as:  ZANTAC      TAKE these medications        acetaminophen 325 MG tablet  Commonly known as:  TYLENOL  Take 2 tablets (650 mg total) by mouth every 6 (six) hours as needed for mild pain or moderate pain.     allopurinol 300 MG tablet  Commonly known as:  ZYLOPRIM  Take 300 mg by mouth every morning.     aspirin EC 81 MG tablet  Take 81 mg by mouth daily.     bisacodyl 10 MG suppository  Commonly known as:  DULCOLAX  Place 1 suppository (10 mg total) rectally daily as needed for moderate constipation.     buPROPion 150 MG 24 hr tablet  Commonly known as:  WELLBUTRIN XL  Take 1 tablet (150 mg total) by mouth every morning.  cholecalciferol 1000 UNITS tablet  Commonly known as:  VITAMIN D  Take 1,000 Units by mouth daily.     clopidogrel 75 MG tablet  Commonly known as:  PLAVIX  Take 1 tablet (75 mg total) by mouth daily.     DSS 100 MG Caps  Take 100 mg by mouth 2 (two) times daily.     Ferrous Gluconate 256 (28 FE) MG Tabs  Take 256 mg by mouth daily.     furosemide 40 MG tablet  Commonly known as:  LASIX  Take 1 tablet (40 mg total) by mouth daily.     ipratropium-albuterol 0.5-2.5 (3) MG/3ML Soln  Commonly known as:  DUONEB  Take 3 mLs by nebulization every 6 (six) hours as needed (Dyspnea or wheezing.).     LORazepam 1 MG tablet  Commonly known as:  ATIVAN  Take 1 tablet (1 mg total) by mouth every morning.     Magnesium 250 MG Tabs  Take 250 mg by mouth daily.     mirtazapine 15 MG tablet  Commonly known as:  REMERON  Take 1 tablet (15 mg total) by mouth at  bedtime.     MYRBETRIQ 50 MG Tb24 tablet  Generic drug:  mirabegron ER  Take 50 mg by mouth daily.     pantoprazole 40 MG tablet  Commonly known as:  PROTONIX  Take 1 tablet (40 mg total) by mouth 2 (two) times daily.     polyethylene glycol packet  Commonly known as:  MIRALAX / GLYCOLAX  Take 17 g by mouth 2 (two) times daily.     pregabalin 100 MG capsule  Commonly known as:  LYRICA  Take 1 capsule (100 mg total) by mouth 2 (two) times daily.     rOPINIRole 4 MG tablet  Commonly known as:  REQUIP  Take 4 mg by mouth at bedtime.     tamsulosin 0.4 MG Caps capsule  Commonly known as:  FLOMAX  Take 1 capsule (0.4 mg total) by mouth daily.       Follow-up Information    Follow up with PARRETT,TAMMY, NP On 07/31/2014.   Specialty:  Nurse Practitioner   Why:  at 2 PM.   Contact information:   [address] [place] [address] [phone]       Follow up with Darden Palmer, MD. Schedule an appointment as soon as possible for a visit in 1 week.   Specialty:  Cardiology   Contact information:   [address] [place] [address] [place] [address] [phone]       Follow up with [name], MD.   Specialty:  Internal Medicine   Why:  To follow upon discharge from SNF.   Contact information:   [address] [place] [address] [phone]       Follow up with MD at SNF. Schedule an appointment as soon as possible for a visit in 5 days.   Why:  To be seen with repeat labs (CBC & BMP).       The results of significant diagnostics from this hospitalization (including imaging, microbiology, ancillary and laboratory) are listed below for reference.    Significant Diagnostic Studies: Ct Head Wo Contrast  07/13/2014   CLINICAL DATA:  Headache, bilateral leg swelling  EXAM: CT HEAD WITHOUT CONTRAST  TECHNIQUE: Contiguous axial images were obtained from the base of the skull through the vertex without intravenous  contrast.  COMPARISON:  05/29/2014  FINDINGS: Mild cortical volume loss noted with proportional ventricular prominence. Areas of periventricular white matter hypodensity are most compatible with small vessel ischemic change. No acute hemorrhage, infarct, or mass lesion is identified. No midline shift. Mild ethmoid mucoperiosteal thickening. No skull fracture. Remote nasal bone deformity reidentified.  IMPRESSION: No acute intracranial finding.   Electronically Signed   By: Christiana Pellant M.D.   On: 07/13/2014 23:40   Dg Chest Portable 1 View  07/17/2014   CLINICAL DATA:  Altered mental status.  EXAM: PORTABLE CHEST - 1 VIEW  COMPARISON:  Single view of the chest 07/13/2014.  FINDINGS: The lungs are clear. Heart size is normal. No pneumothorax or pleural effusion. Degenerative change about the shoulders is noted.  IMPRESSION: No acute disease.   Electronically Signed   By: Drusilla Kanner M.D.   On: 07/17/2014 13:31   Dg Chest Port 1 View  07/13/2014   CLINICAL DATA:  Initial evaluation for shortness of breath.  EXAM: PORTABLE CHEST - 1 VIEW  COMPARISON:  Prior radiograph from 07/10/2014.  FINDINGS: Cardiomegaly is stable from prior study. Atherosclerotic calcifications noted within the aortic arch.  Lungs are mildly hypoinflated. Patchy and linear bibasilar opacities favored to reflect atelectasis, similar to prior. Mild diffuse bronchitic changes present. No focal infiltrates identified. No pulmonary edema or pleural effusion. No pneumothorax.  No acute osseus abnormality.  IMPRESSION: Shallow lung inflation with patchy and linear bibasilar atelectasis. Overall, the appearance of the chest is not significantly changed relative to 07/10/2014. No other active cardiopulmonary disease identified.   Electronically Signed   By: Rise Mu M.D.   On: 07/13/2014 20:22   Dg Abd 2 Views  07/13/2014   CLINICAL DATA:  Shortness of breath with abdominal pain for 3 days.  EXAM: ABDOMEN - 2 VIEW   COMPARISON:  Radiographs 07/10/2014. Chest radiographs done today are correlated.  FINDINGS: Somewhat nodular reticular densities at both lung bases are similar to recent studies and likely exacerbated by lower lung volumes on this study. The bowel gas pattern is normal. There is no free intraperitoneal air or bowel wall thickening. There are no suspicious abdominal calcifications. There are degenerative and postsurgical changes throughout the lumbar spine.  IMPRESSION: No acute abdominal findings.  The bowel gas pattern remains normal.   Electronically Signed   By: Roxy Horseman M.D.   On: 07/13/2014 21:10   Dg Abd Acute W/chest  07/10/2014   CLINICAL DATA:  Shortness of breath, abdominal distension  EXAM: ACUTE ABDOMEN SERIES (ABDOMEN 2 VIEW & CHEST 1 VIEW)  COMPARISON:  06/01/2014  FINDINGS: Cardiomediastinal silhouette is stable. There is fluid or thickening in right minor fissure. No segmental infiltrate or pulmonary edema. Streaky mild basilar atelectasis.  Mild gaseous distended bowel loops in right abdomen probable mild ileus. Stool and gas noted within distal colon. No free abdominal air. Degenerative changes lumbar spine.  IMPRESSION: No infiltrate or pulmonary edema. Streaky mild basilar atelectasis. Mild gaseous distended small bowel loops in right abdomen probable mild ileus. No air-fluid levels. Stool and gas noted within distal colon. No free abdominal air.   Electronically Signed   By: Natasha Mead M.D.   On: 07/10/2014 08:23    Microbiology: Recent Results (from the past 240 hour(s))  MRSA PCR Screening     Status: None   Collection Time: 07/14/14  6:01 AM  Result Value Ref Range Status   MRSA by PCR NEGATIVE NEGATIVE Final    Comment:        The  GeneXpert MRSA Assay (FDA approved for NASAL specimens only), is one component of a comprehensive MRSA colonization surveillance program. It is not intended to diagnose MRSA infection nor to guide or monitor treatment for MRSA  infections.   Culture, blood (routine x 2)     Status: None   Collection Time: 07/14/14  7:40 AM  Result Value Ref Range Status   Specimen Description BLOOD RIGHT ANTECUBITAL  Final   Special Requests BOTTLES DRAWN AEROBIC ONLY 5CC  Final   Culture  Setup Time   Final    07/14/2014 15:08 Performed at [facility]    Culture   Final    NO GROWTH 5 DAYS Performed at [facility]    Report Status 07/20/2014 FINAL  Final  Culture, blood (routine x 2)     Status: None   Collection Time: 07/14/14  7:45 AM  Result Value Ref Range Status   Specimen Description BLOOD RIGHT HAND  Final   Special Requests BOTTLES DRAWN AEROBIC ONLY 10CC  Final   Culture  Setup Time   Final    07/14/2014 15:08 Performed at [facility]    Culture   Final    NO GROWTH 5 DAYS Performed at [facility]    Report Status 07/20/2014 FINAL  Final  Blood Culture (routine x 2)     Status: None (Preliminary result)   Collection Time: 07/17/14 12:15 PM  Result Value Ref Range Status   Specimen Description BLOOD RIGHT ARM  Final   Special Requests BOTTLES DRAWN AEROBIC AND ANAEROBIC  Final   Culture   Final           BLOOD CULTURE RECEIVED NO GROWTH TO DATE CULTURE WILL BE HELD FOR 5 DAYS BEFORE ISSUING A FINAL NEGATIVE REPORT Note: Culture results may be compromised due to an excessive volume of blood received in culture bottles. Performed at [facility]    Report Status PENDING  Incomplete  Blood Culture (routine x 2)     Status: None (Preliminary result)   Collection Time: 07/17/14  1:25 PM  Result Value Ref Range Status   Specimen Description BLOOD RIGHT HAND  Final   Special Requests BOTTLES DRAWN AEROBIC AND ANAEROBIC  Final   Culture   Final           BLOOD CULTURE RECEIVED NO GROWTH TO DATE CULTURE WILL BE HELD FOR 5 DAYS BEFORE ISSUING A FINAL NEGATIVE REPORT Note: Culture results may be compromised due to an excessive volume of blood received in  culture bottles. Performed at [facility]    Report Status PENDING  Incomplete  Urine culture     Status: None   Collection Time: 07/17/14  1:36 PM  Result Value Ref Range Status   Specimen Description URINE, CATHETERIZED  Final   Special Requests NONE  Final   Colony Count NO GROWTH Performed at [facility]   Final   Culture NO GROWTH Performed at [facility]   Final   Report Status 07/18/2014 FINAL  Final     Labs: Basic Metabolic Panel:  Recent Labs Lab 07/15/14 0333 07/16/14 0435 07/17/14 1300 07/17/14 1810 07/18/14 0515 07/19/14 0555 07/20/14 0817 07/21/14 0402  NA  --  137 138  --  140 139 140 137  K  --  4.4 4.4  --  4.5 4.7 5.1 4.4  CL  --  93* 96  --  102 102 103 101  CO2  --  36* 26  --  25 29 30 29   GLUCOSE  --  110* 141*  --  114* 103* 95 95  BUN  --  25* 28*  --  24* 24* 22 17  CREATININE  --  1.71* 2.13* 2.19* 2.04* 1.71* 1.31 1.09  CALCIUM  --  9.1 9.4  --  9.2 8.9 9.0 8.8  MG 2.3 2.4  --   --   --   --   --   --    Liver Function Tests:  Recent Labs Lab 07/15/14 0325 07/16/14 0435 07/17/14 1300  AST 21 20 27   ALT 23 17 17   ALKPHOS 103 98 102  BILITOT 0.8 1.1 0.8  PROT 7.5 6.9 7.6  ALBUMIN 3.7 3.4* 3.8   No results for input(s): LIPASE, AMYLASE in the last 168 hours.  Recent Labs Lab 07/19/14 0555  AMMONIA 12   CBC:  Recent Labs Lab 07/15/14 0333 07/17/14 1300 07/17/14 1810 07/18/14 0515 07/19/14 0555  WBC 7.5 6.7 6.7 8.1 7.1  NEUTROABS 5.6 4.0  --   --   --   HGB 14.2 15.5 14.8 15.2 14.3  HCT 46.4 48.2 46.4 48.0 46.7  MCV 95.7 90.8 91.2 92.8 94.9  PLT 107* 138* 114* 124* 126*   Cardiac Enzymes:  Recent Labs Lab 07/17/14 1300 07/17/14 1810 07/18/14 0515  TROPONINI 0.05* 0.05* 0.06*   BNP: BNP (last 3 results)  Recent Labs  05/28/14 1508 07/10/14 0801 07/13/14 1928  PROBNP 477.3* 422.7 814.6*   CBG:  Recent Labs Lab 07/15/14 1629 07/15/14 2112 07/16/14 0556 07/16/14 1112  07/21/14 0756  GLUCAP 173* 127* 113* 114* 92       Signed:  Lova Urbieta, MD, FACP, FHM. [facility] Pager [phone]  If 7PM-7AM, please contact night-coverage [email or web address] Password [email or web address] 07/21/2014, 1:29 PM

## 2014-07-21 NOTE — Telephone Encounter (Signed)
Noted- will forward to [facility] as an [place]

## 2014-07-21 NOTE — Progress Notes (Addendum)
CARE MANAGEMENT NOTE 07/21/2014  Patient:  MAYAN, FILLERS   Account Number:  [id number]  Date Initiated:  07/21/2014  Documentation initiated by:  [facility]  Subjective/Objective Assessment:   Encephalopathy acute     Action/Plan:   Anticipated DC Date:  07/21/2014   Anticipated DC Plan:  SKILLED NURSING FACILITY  In-house referral  Clinical Social Worker      DC Planning Services  CM consult      Choice offered to / List presented to:             Status of service:  Completed, signed off Medicare Important Message given?  YES (If response is "NO", the following Medicare IM given date fields will be blank) Date Medicare IM given:  07/21/2014 Medicare IM given by:  [facility] Date Additional Medicare IM given:   Additional Medicare IM given by:    Discharge Disposition:  SKILLED NURSING FACILITY  Per UR Regulation:    If discussed at Long Length of Stay Meetings, dates discussed:    Comments:  07/21/2014 1417 Chart reviewed. SNF placement. CSW following for placement. Isidoro Donning RN CCM Case Mgmt phone [phone]

## 2014-07-21 NOTE — Progress Notes (Signed)
07/21/14 1030 Patient has had oxygen off while in his room  02 at 92 % at rest, ambulating in room 90 % with no desating.

## 2014-07-21 NOTE — Progress Notes (Signed)
07/21/14 Patient to be discharged from the hospital today. IV site removed and discharge instructions reviewed with patient.

## 2014-07-21 NOTE — Clinical Social Work Note (Signed)
Per MD patient ready to DC back to [facility]. RN, patient/family Eunice Blase), and facility notified of patient's DC. RN given number for report. DC packet on patient's chart. Eunice Blase states she will pick patient up from [facility] between 3:30pm and 4:00pm. CSW signing off at this time.   Roddie Mc MSW, [place], [place], [phone]

## 2014-07-21 NOTE — Clinical Social Work Psychosocial (Signed)
Clinical Social Work Department BRIEF PSYCHOSOCIAL ASSESSMENT 07/21/2014  Patient:  [name], [name]     Account Number:  [id number]     Admit date:  07/17/2014  Clinical Social Worker:  Lovey Newcomer  Date/Time:  07/21/2014 10:55 AM  Referred by:  Physician  Date Referred:  07/21/2014 Referred for  SNF Placement   Other Referral:   NA   Interview type:  Patient Other interview type:   Patient alert and oriented at time of assessment.    PSYCHOSOCIAL DATA Living Status:  FAMILY Admitted from facility:   Level of care:   Primary support name:  Debra Primary support relationship to patient:  CHILD, ADULT Degree of support available:   Support is strong.    CURRENT CONCERNS Current Concerns  Post-Acute Placement   Other Concerns:   NA    SOCIAL WORK ASSESSMENT / PLAN CSW met with patient at bedside to complete assessment. Patient states he plans to Dc to [facility] when medically stable. CSW explained role and process of getting him back to the facility. [facility] states they are able to accept the patient back at discharge. Patient plans to go to facility by EMS. Patient appears to be calm and engaged in assessment.   Assessment/plan status:  Psychosocial Support/Ongoing Assessment of Needs Other assessment/ plan:   Complete Fl2, Fax, PASRR   Information/referral to community resources:   CSW contact information given.    PATIENT'S/FAMILY'S RESPONSE TO PLAN OF CARE: Patient is returning to [place] at discharge. The patient is agreeable to this plan. CSW will assist.       Liz Beach MSW, [place], [place], [phone]

## 2014-07-22 ENCOUNTER — Ambulatory Visit: Payer: Medicare Other | Admitting: Family

## 2014-07-23 LAB — CULTURE, BLOOD (ROUTINE X 2): Culture: NO GROWTH

## 2014-07-24 NOTE — Telephone Encounter (Signed)
Noted, but lets make sure we f/u on this.  Perhaps a timed message to come back to [place]y in a few weeks??  Thanks.

## 2014-07-26 LAB — CULTURE, BLOOD (ROUTINE X 2): Culture: NO GROWTH

## 2014-07-27 NOTE — Telephone Encounter (Signed)
Staff message sent to [facility] to check on this in a few weeks. Carron Curie, CMA

## 2014-07-28 ENCOUNTER — Encounter: Payer: Self-pay | Admitting: Cardiology

## 2014-07-28 ENCOUNTER — Encounter (HOSPITAL_COMMUNITY): Payer: Self-pay | Admitting: Cardiology

## 2014-07-28 DIAGNOSIS — I5032 Chronic diastolic (congestive) heart failure: Secondary | ICD-10-CM

## 2014-07-28 NOTE — Progress Notes (Signed)
Patient ID: [name], male   DOB: December 30, 1932, 81 y.o.   MRN: [id number]   [name], [name]  Date of visit:  07/28/2014 DOB:  1933-05-05    Age:  81 yrs. Medical record number:  [id number]     Account number:  [id number] Primary Care Provider: [name] ANN ____________________________ CURRENT DIAGNOSES  1. Atherosclerotic heart disease of native coronary artery without angina pectoris  2. Aortic valve stenosis  3. Presence of coronary angioplasty implant and graft  4. Chronic kidney disease, stage 3 (moderate)  5. Chronic diastolic heart failure  6. Type 2 diabetes mellitus with diabetic nephropathy  7. Other obesity  8. Other hyperlipidemia  9. Sleep apnea  10. Hypertensive heart disease without heart failure  11. Restless legs syndrome  12. Idiopathic gout, multiple sites  13. Shortness of breath  14. Personal history of transient ischemic attack (TIA), and cerebral infarction without residual deficits  15. Occlusion and stenosis of right carotid artery ____________________________ ALLERGIES  Atorvastatin, Muscle aches  Febuxostat, Rash  Simvastatin, Muscle aches ____________________________ MEDICATIONS  1. Requip 4 mg Tablet, 1 p.o. daily  2. Vitamin D 1,000 unit Capsule, 1 p.o. daily  3. mirtazapine 15 mg Tablet, Rapid Dissolve, 1 p.o. daily  4. lorazepam 1 mg Tablet, 1 p.o. daily  5. Wellbutrin SR 150 mg tablet extended release, 1 p.o. daily  6. allopurinol 300 mg tablet, 1 p.o. daily  7. ferrous gluconate 256 mg (28 mg iron) tablet, 1 p.o. daily  8. magnesium 250 mg tablet, 1 p.o. daily  9. clopidogrel 75 mg tablet, 1 p.o. daily  10. aspirin 81 mg chewable tablet, 1 p.o. daily  11. tamsulosin ER 0.4 mg capsule,extended release 24 hr, 1 p.o. daily  12. furosemide 40 mg tablet, 1 p.o. daily  13. ipratropium-albuterol 0.5 mg-3 mg(2.5 mg base)/3 mL nebulization soln, PRN  14. Myrbetriq 50 mg tablet,extended release, 1 p.o. daily  15. Lyrica 100 mg capsule, BID  16. Miralax 17 gram/dose oral powder, BID  17. Colace 100 mg capsule, BID ____________________________ CHIEF COMPLAINTS  Followup of Chronic diastolic heart failure  Followup of Hypertensive heart disease without heart failure ____________________________ HISTORY OF PRESENT ILLNESS Patient seen for cardiac followup. Since he was previously here he has had an extremely eventful course. He has been hospitalized 5 times since he was here. He was hospitalized in July when he became weak and somewhat confused. He was hospitalized in August in the setting of a urinary tract infection and was hospitalized in November with a viral pneumonitis leading to acute respiratory failure and what was felt to be obesity hypoventilation syndrome. He had a similar presentation following a urinary tract infection in December and then became dehydrated after he was discharged from the hospital and was readmitted briefly last week. He is currently in a nursing home. He is feeling better at the present time. He does complain of some mild dyspnea and has not been using his CPAP regularly. He was felt to have diastolic dysfunction and was also found to have moderate aortic stenosis. He was somewhat bradycardic in the setting of his respiratory failure and his metoprolol was discontinued. He is mildly hypertensive now. He is evidently going to be going back home with children that are staying with him at night. He does have some edema and has a prior history of some chronic venous stasis. He denies PND or orthopnea and does not have claudication. Extensive changes have been made to his medication. He has had difficulty with arthritis  as well as significant chronic back pain and has been quite inactive. ____________________________ PAST HISTORY  Past Medical Illnesses:  hypertension, hyperlipidemia, obesity, gout, TIA, restless legs, BPH, right brain CVA, osteoarthritis, sleep apnea;  Cardiovascular Illnesses:  CAD, staph  pericarditis 11/07 with window, history of  carotid atery disease;  Surgical Procedures:  cataract extraction OU, detached retina, carpal tunnel release, shoulder repair-rt, nasal surg, carotid endarterectomy-left;  NYHA Classification:  II;  Canadian Angina Classification:  Class 0: Asymptomatic;  Cardiology Procedures-Invasive:  pericardial window 06/2006, cardiac cath (left) July 2010, Xience DES stent  circumflex  July 2010  Dr. Excell Seltzer, cardiac cath (left) September 2011;  Cardiology Procedures-Noninvasive:  regadenoson thallium July 2010, echocardiogram May 2013, echocardiogram January 2014, echocardiogram December 2014;  Cardiac Cath Results:  normal Left main, 20 % stenosis proximal LAD, 40% stenosis mid LAD, 70% stenosis proximal Diag 1, 90% stenosis prox CFX, 90% first OM, small and nondominant RCA;  LVEF of 55% documented via echocardiogram on 05/29/2014,   ____________________________ CARDIO-PULMONARY TEST DATES EKG Date:  08/26/2013;   Cardiac Cath Date:  04/15/2010;  Stent Placement Date: 02/11/2009;  Nuclear Study Date:  02/04/2009;  Echocardiography Date: 05/29/2014;  Chest Xray Date: 07/17/2014;   ____________________________ FAMILY HISTORY Brother -- Brother dead, Carcinoma of the pancreas Father -- Father dead, Coronary Artery Disease, Deceased Mother -- Mother dead, Coronary Artery Disease Sister -- Sister alive and well Sister -- Sister dead, Chronic obstructive lung disease Sister -- Sister dead, Cancer Sister -- Sister alive with problem, Dementia/Alzheimer's ____________________________ SOCIAL HISTORY Alcohol Use:  no alcohol use;  Smoking:  never smoked;  Diet:  regular diet;  Lifestyle:  widower;  Exercise:  no regular exercise;  Occupation:  retired and [employer];  Residence:  lives with daughter;   ____________________________ REVIEW OF SYSTEMS General:  obesity  Integumentary:easy bruisability Eyes: history of retinal detachment, cataract extraction O.U.  Respiratory: mild dyspnea with exertion Cardiovascular:  please review HPI Abdominal: see HPI Genitourinary-Male: nocturia, erectile dysfunction  Musculoskeletal:  arthritis of the shoulder, arthritis of the neck, restless legs, chronic low back pain Neurological:  left arm weakness  ____________________________ PHYSICAL EXAMINATION VITAL SIGNS  Blood Pressure:  136/70 Sitting, Left arm, regular cuff  , 142/70 Standing, Left arm and regular cuff   Pulse:  96/min. Weight:  237.00 lbs. Height:  70"BMI: 34  Constitutional:  pleasant white male in no acute distress, moderately obese Skin:  scattered hemangiomas, multiple seborrhic keratosis Head:  normocephalic, normal hair pattern, no masses or tenderness ENT:  rosacea, several missing teeth Neck:  supple, no masses, thyromegaly, JVD. Carotid pulses are full and equal bilaterally without bruits., healed left carotid endarterectomy scar Chest:  normal symmetry, clear to auscultation. Cardiac:  regular rhythm, normal S1 and S2, no S3 or S4, grade 2/6 systolic murmur left sternal border Peripheral Pulses:  pulses full and equal in all extremities Extremities & Back:  large tophaceous knot on right knee and elbows, 1+ edema, bilateral venous insufficiency changes present, amputation of part of fifth finger Neurological:  weakness left hand ____________________________ MOST RECENT LIPID PANEL 07/09/13  CHOL TOTL 192 mg/dl, LDL 161 NM, HDL 25 mg/dl, TRIGLYCER 096 mg/dl and CHOL/HDL 7.8 (Calc) ____________________________ IMPRESSIONS/PLAN  1. Recent hospitalization with encephalopathy and acute respiratory failure responding to BiPAP with chronic diastolic heart failure with dehydration 2. Coronary artery disease with previous stenting 3. Moderate aortic stenosis 4. Sleep apnea and restless leg syndrome 5. Stage III chronic kidney disease  Recommendations:  Extensive hospital records reviewed  since last year. Quite difficult to assess. We  talked about the importance of weighing on a regular basis and reporting if the weight goes up. Stay off of beta blocker at the present time. Followup in one month. ____________________________ TODAYS ORDERS  1. Return Visit: 1 month  2. Lipid Panel: 1 month                       ____________________________ Cardiology Physician:  Darden Palmer MD [facility]

## 2014-07-31 ENCOUNTER — Ambulatory Visit (INDEPENDENT_AMBULATORY_CARE_PROVIDER_SITE_OTHER): Payer: Medicare Other | Admitting: Adult Health

## 2014-07-31 ENCOUNTER — Encounter: Payer: Self-pay | Admitting: Adult Health

## 2014-07-31 ENCOUNTER — Telehealth: Payer: Self-pay | Admitting: Internal Medicine

## 2014-07-31 VITALS — BP 118/74 | HR 101 | Temp 97.9°F | Ht 69.0 in | Wt 237.0 lb

## 2014-07-31 DIAGNOSIS — E662 Morbid (severe) obesity with alveolar hypoventilation: Secondary | ICD-10-CM

## 2014-07-31 NOTE — Progress Notes (Signed)
   Subjective:    Patient ID: [name], male    DOB: [birth year], 81 y.o.   MRN: [id number]  HPI 81 yo male with OSA on CPAP   07/31/2014 post hospital follow-up Patient presents for a post hospital follow-up. Patient was admitted December 21 of December 24 for acute hypoxemic and hypercarbic respiratory failure, acute on chronic diastolic congestive heart failure decompensation with moderate aortic stenosis and OSA with OHS.-With known CPAP noncompliant PCO2 was ~70.  Patient was treated with aggressive IV diuresis, BIPAP , and his Lyrica was decreased.  Patient had bradycardia during his hospital. They show metoprolol was discontinued Since discharge. Patient is feeling improved with decreased shortness of breath and lower extremity swelling. Wt remains down 14lbs .  Says since discharge he wearing his CPAP everynight- wears 6hr each night.  Leg swelling is much better, .     Review of Systems Constitutional:   No  weight loss, night sweats,  Fevers, chills,  +fatigue, or  lassitude.  HEENT:   No headaches,  Difficulty swallowing,  Tooth/dental problems, or  Sore throat,                No sneezing, itching, ear ache,  +nasal congestion, post nasal drip,   CV:  No chest pain,  Orthopnea, PND,  anasarca, dizziness, palpitations, syncope.   GI  No heartburn, indigestion, abdominal pain, nausea, vomiting, diarrhea, change in bowel habits, loss of appetite, bloody stools.   Resp: .   No non-productive cough,  No coughing up of blood.  No change in color of mucus.  No wheezing.  No chest wall deformity  Skin: no rash or lesions.  GU: no dysuria, change in color of urine, no urgency or frequency.  No flank pain, no hematuria   MS:  No joint pain or swelling.  No decreased range of motion.  No back pain.  Psych:  No change in mood or affect. No depression or anxiety.  No memory loss.         Objective:   Physical Exam GEN: A/Ox3; pleasant , NAD obese   HEENT:  [place]/AT,   EACs-clear, TMs-wnl, NOSE-clear, THROAT-clear, no lesions, no postnasal drip or exudate noted.   NECK:  Supple w/ fair ROM; no JVD; normal carotid impulses w/o bruits; no thyromegaly or nodules palpated; no lymphadenopathy.  RESP  Decreased BS in bases no accessory muscle use, no dullness to percussion  CARD:  RRR, no m/r/g  , tr peripheral edema, pulses intact, no cyanosis or clubbing.  GI:   Soft & nt; nml bowel sounds; no organomegaly or masses detected.obese   Musco: Warm bil, no deformities or joint swelling noted.   Neuro: alert, no focal deficits noted.    Skin: Warm, no lesions or rashes         Assessment & Plan:

## 2014-07-31 NOTE — Progress Notes (Signed)
Ov reviewed, and agree with plan as outlined.  

## 2014-07-31 NOTE — Assessment & Plan Note (Signed)
OSA /OHS with decompensation , hypercarbic RF and diastolic CHF  Improved with diuresis , BIPAP support  Request download , may need BIPAP vs CPAP   Plan  Wear CPAP every night and with naps.  Low salt diet  Legs elevated.  CPAP download requested  Follow up Dr. Shelle Iron in 6 weeks and As needed

## 2014-07-31 NOTE — Patient Instructions (Addendum)
Wear CPAP every night and with naps.  Low salt diet  Legs elevated.  CPAP download requested  Follow up Dr. Shelle Iron in 6 weeks and As needed

## 2014-07-31 NOTE — Telephone Encounter (Signed)
Verbal order has been given by physician

## 2014-08-04 ENCOUNTER — Telehealth: Payer: Self-pay | Admitting: Pulmonary Disease

## 2014-08-04 NOTE — Telephone Encounter (Signed)
Please let pt know that his download shows that his sleep apnea is being adequately controlled on his current setup, but he is only averaging about 4 1/2 hrs a night of use.  He needs to increase this to 6 or more if he wishes to see more impact to his alertness/energy during the day .

## 2014-08-04 NOTE — Telephone Encounter (Signed)
lmomtcb x1 for pt 

## 2014-08-04 NOTE — Telephone Encounter (Signed)
I spoke with patient about results and he verbalized understanding and had no questions 

## 2014-08-05 ENCOUNTER — Inpatient Hospital Stay: Payer: Medicare Other | Admitting: Internal Medicine

## 2014-08-05 ENCOUNTER — Encounter: Payer: Self-pay | Admitting: Family

## 2014-08-05 ENCOUNTER — Other Ambulatory Visit (INDEPENDENT_AMBULATORY_CARE_PROVIDER_SITE_OTHER): Payer: Medicare Other

## 2014-08-05 ENCOUNTER — Ambulatory Visit (INDEPENDENT_AMBULATORY_CARE_PROVIDER_SITE_OTHER): Payer: Medicare Other | Admitting: Family

## 2014-08-05 VITALS — BP 138/84 | HR 85 | Temp 97.6°F | Resp 18 | Ht 69.0 in | Wt 241.0 lb

## 2014-08-05 DIAGNOSIS — G9340 Encephalopathy, unspecified: Secondary | ICD-10-CM

## 2014-08-05 LAB — BASIC METABOLIC PANEL
BUN: 21 mg/dL (ref 6–23)
CO2: 30 mEq/L (ref 19–32)
Calcium: 9.5 mg/dL (ref 8.4–10.5)
Chloride: 102 mEq/L (ref 96–112)
Creatinine, Ser: 1.26 mg/dL (ref 0.40–1.50)
GFR: 58.6 mL/min — ABNORMAL LOW (ref >60.00)
Glucose, Bld: 99 mg/dL (ref 70–99)
POTASSIUM: 4.9 meq/L (ref 3.5–5.1)
SODIUM: 139 meq/L (ref 135–145)

## 2014-08-05 LAB — CBC
HCT: 47.8 % (ref 39.0–52.0)
HEMOGLOBIN: 15.1 g/dL (ref 13.0–17.0)
MCHC: 31.7 g/dL (ref 30.0–36.0)
MCV: 90.6 fl (ref 78.0–100.0)
PLATELETS: 163 10*3/uL (ref 150.0–400.0)
RBC: 5.28 Mil/uL (ref 4.22–5.81)
RDW: 17.2 % — ABNORMAL HIGH (ref 11.5–15.5)
WBC: 5.6 10*3/uL (ref 4.0–10.5)

## 2014-08-05 MED ORDER — PREGABALIN 100 MG PO CAPS: 100.0000 mg | ORAL_CAPSULE | Freq: Two times a day (BID) | ORAL | Status: DC

## 2014-08-05 MED ORDER — ROPINIROLE HCL 4 MG PO TABS: 4.0000 mg | ORAL_TABLET | Freq: Every day | ORAL | Status: DC

## 2014-08-05 MED ORDER — PANTOPRAZOLE SODIUM 40 MG PO TBEC: 40.0000 mg | DELAYED_RELEASE_TABLET | Freq: Two times a day (BID) | ORAL | Status: DC

## 2014-08-05 MED ORDER — BUPROPION HCL ER (XL) 150 MG PO TB24: 150.0000 mg | ORAL_TABLET | Freq: Every morning | ORAL | Status: DC

## 2014-08-05 MED ORDER — LORAZEPAM 1 MG PO TABS: 1.0000 mg | ORAL_TABLET | Freq: Every morning | ORAL | Status: DC

## 2014-08-05 MED ORDER — MIRTAZAPINE 15 MG PO TABS: 15.0000 mg | ORAL_TABLET | Freq: Every day | ORAL | Status: DC

## 2014-08-05 NOTE — Assessment & Plan Note (Signed)
Encephalopathy resolved with treatment. Physical and neurological exam are within normal limits with no evidence of confusion or disorientation. Obtain CBC and basic metabolic panel. Follow up if symptoms return.

## 2014-08-05 NOTE — Progress Notes (Signed)
Subjective:    Patient ID: Cavon Nicolls, male    DOB: Mar 03, 1933, 81 y.o.   MRN: [id number]  Chief Complaint  Patient presents with  . Hospitalization Follow-up    Says he is doing better since hospital visit, has a nurse that comes out 2 times a week, needs med refill     HPI:  Khyri Hinzman is a 81 y.o. male who presents today for a hospital follow up.  Mr. Tanda Rockers was recently hospitalized for acute encephalopathy. Pt indicates that he was short of breath and was determined to have a UTI and returned home and then returned to the hospital on Christmas day and was discharged back to [facility] after 4 days in the hospital for dehyration. His family believed him to have a steep functional decline becoming increasingly confused, disoriented, having hallucinations, decreased oral intake, and overall weakness. At the time of admission he had no urinary tract infection and no cardiopulmonary disease noted on his chest x-ray within normal CBC. He was discharged from skilled nursing facility.  Indicates that he has been feeling better and denying any confusion, disorientation. States that he is eating his normal 2 meals per day. Denies any signs of urinary tract infection. Breathing has improved and indicates that he is able to pick something up without being short of breath.  Allergies  Allergen Reactions  . Statins Other (See Comments)    Severe leg myalgias, weakness    Current Outpatient Prescriptions on File Prior to Visit  Medication Sig Dispense Refill  . acetaminophen (TYLENOL) 325 MG tablet Take 2 tablets (650 mg total) by mouth every 6 (six) hours as needed for mild pain or moderate pain.    Marland Kitchen allopurinol (ZYLOPRIM) 300 MG tablet Take 300 mg by mouth every morning.    Marland Kitchen aspirin EC 81 MG tablet Take 81 mg by mouth daily.    . bisacodyl (DULCOLAX) 10 MG suppository Place 1 suppository (10 mg total) rectally daily as needed for moderate constipation. 12 suppository 0  .  cholecalciferol (VITAMIN D) 1000 UNITS tablet Take 1,000 Units by mouth daily.    . clopidogrel (PLAVIX) 75 MG tablet Take 1 tablet (75 mg total) by mouth daily. 90 tablet 3  . docusate sodium 100 MG CAPS Take 100 mg by mouth 2 (two) times daily. 10 capsule 0  . Ferrous Gluconate 256 (28 FE) MG TABS Take 256 mg by mouth daily.    . furosemide (LASIX) 40 MG tablet Take 1 tablet (40 mg total) by mouth daily.    Marland Kitchen ipratropium-albuterol (DUONEB) 0.5-2.5 (3) MG/3ML SOLN Take 3 mLs by nebulization every 6 (six) hours as needed (Dyspnea or wheezing.).    . Magnesium 250 MG TABS Take 250 mg by mouth daily.     . mirabegron ER (MYRBETRIQ) 50 MG TB24 tablet Take 50 mg by mouth daily.    . polyethylene glycol (MIRALAX / GLYCOLAX) packet Take 17 g by mouth 2 (two) times daily. 14 each 0  . tamsulosin (FLOMAX) 0.4 MG CAPS capsule Take 1 capsule (0.4 mg total) by mouth daily. 90 capsule 3   No current facility-administered medications on file prior to visit.    Review of Systems  Constitutional: Negative for fever and chills.  Genitourinary: Negative for urgency.  Neurological: Negative for headaches.  Psychiatric/Behavioral: Negative for confusion and agitation.      Objective:    BP 138/84 mmHg  Pulse 85  Temp(Src) 97.6 F (36.4 C) (Oral)  Resp 18  Ht 5'  9" (1.753 m)  Wt 241 lb (109.317 kg)  BMI 35.57 kg/m2  SpO2 95% Nursing note and vital signs reviewed.  Physical Exam  Constitutional: He is oriented to person, place, and time. He appears well-developed and well-nourished. No distress.  Cardiovascular: Normal rate, regular rhythm, normal heart sounds and intact distal pulses.   Pulmonary/Chest: Effort normal and breath sounds normal.  Neurological: He is alert and oriented to person, place, and time.  Skin: Skin is warm and dry.  Psychiatric: He has a normal mood and affect. His behavior is normal. Judgment and thought content normal.       Assessment & Plan:

## 2014-08-05 NOTE — Progress Notes (Signed)
Pre visit review using our clinic review tool, if applicable. No additional management support is needed unless otherwise documented below in the visit note. 

## 2014-08-05 NOTE — Patient Instructions (Signed)
Thank you for choosing [facility].  Summary/Instructions:  Your prescription(s) have been submitted to your pharmacy or been printed and provided for you. Please take as directed and contact our office if you believe you are having problem(s) with the medication(s) or have any questions.  If your symptoms worsen or fail to improve, please contact our office for further instruction, or in case of emergency go directly to the emergency room at the closest medical facility.   Keep taking medications as prescribed.  Follow up with PCP as needed.

## 2014-08-06 ENCOUNTER — Encounter: Payer: Self-pay | Admitting: Family

## 2014-08-06 ENCOUNTER — Ambulatory Visit: Payer: Medicare HMO | Admitting: Pulmonary Disease

## 2014-08-06 ENCOUNTER — Encounter (HOSPITAL_COMMUNITY): Payer: Self-pay | Admitting: General Surgery

## 2014-08-07 ENCOUNTER — Telehealth: Payer: Self-pay | Admitting: Internal Medicine

## 2014-08-07 NOTE — Telephone Encounter (Signed)
[facility] calling to get verbal order for OT - wants to see throught next wk, self care re-training and improving endurance and safety in the home.  [phone] Belenda Cruise - pls call

## 2014-08-07 NOTE — Telephone Encounter (Signed)
Called Kristin: Verbal order given per PCP.

## 2014-08-11 ENCOUNTER — Telehealth: Payer: Self-pay | Admitting: Internal Medicine

## 2014-08-11 NOTE — Telephone Encounter (Signed)
Call Shanda Bumps [phone] from [facility] regarding this pt's medications.

## 2014-08-11 NOTE — Telephone Encounter (Signed)
LM with on call RN to have Shanda Bumps call me back.

## 2014-08-17 ENCOUNTER — Encounter: Payer: Self-pay | Admitting: Family

## 2014-08-17 ENCOUNTER — Encounter: Payer: Self-pay | Admitting: Vascular Surgery

## 2014-08-18 ENCOUNTER — Ambulatory Visit (HOSPITAL_COMMUNITY)
Admission: RE | Admit: 2014-08-18 | Discharge: 2014-08-18 | Disposition: A | Discharge status: Home / Self Care | Payer: Medicare Other | Source: Ambulatory Visit | Attending: Family | Admitting: Family

## 2014-08-18 ENCOUNTER — Encounter: Payer: Self-pay | Admitting: Family

## 2014-08-18 ENCOUNTER — Ambulatory Visit (INDEPENDENT_AMBULATORY_CARE_PROVIDER_SITE_OTHER): Payer: Medicare Other | Admitting: Family

## 2014-08-18 ENCOUNTER — Telehealth: Payer: Self-pay | Admitting: *Deleted

## 2014-08-18 VITALS — BP 150/68 | HR 62 | Resp 16 | Ht 69.0 in | Wt 247.0 lb

## 2014-08-18 DIAGNOSIS — I872 Venous insufficiency (chronic) (peripheral): Secondary | ICD-10-CM

## 2014-08-18 DIAGNOSIS — I8311 Varicose veins of right lower extremity with inflammation: Secondary | ICD-10-CM

## 2014-08-18 DIAGNOSIS — I6523 Occlusion and stenosis of bilateral carotid arteries: Secondary | ICD-10-CM

## 2014-08-18 DIAGNOSIS — Z48812 Encounter for surgical aftercare following surgery on the circulatory system: Secondary | ICD-10-CM

## 2014-08-18 DIAGNOSIS — Z9889 Other specified postprocedural states: Secondary | ICD-10-CM | POA: Insufficient documentation

## 2014-08-18 DIAGNOSIS — I8312 Varicose veins of left lower extremity with inflammation: Secondary | ICD-10-CM

## 2014-08-18 DIAGNOSIS — I6521 Occlusion and stenosis of right carotid artery: Secondary | ICD-10-CM | POA: Diagnosis not present

## 2014-08-18 NOTE — Patient Instructions (Addendum)
Stroke Prevention Some medical conditions and behaviors are associated with an increased chance of having a stroke. You may prevent a stroke by making healthy choices and managing medical conditions. HOW CAN I REDUCE MY RISK OF HAVING A STROKE?   Stay physically active. Get at least 30 minutes of activity on most or all days.  Do not smoke. It may also be helpful to avoid exposure to secondhand smoke.  Limit alcohol use. Moderate alcohol use is considered to be:  No more than 2 drinks per day for men.  No more than 1 drink per day for nonpregnant women.  Eat healthy foods. This involves:  Eating 5 or more servings of fruits and vegetables a day.  Making dietary changes that address high blood pressure (hypertension), high cholesterol, diabetes, or obesity.  Manage your cholesterol levels.  Making food choices that are high in fiber and low in saturated fat, trans fat, and cholesterol may control cholesterol levels.  Take any prescribed medicines to control cholesterol as directed by your health care provider.  Manage your diabetes.  Controlling your carbohydrate and sugar intake is recommended to manage diabetes.  Take any prescribed medicines to control diabetes as directed by your health care provider.  Control your hypertension.  Making food choices that are low in salt (sodium), saturated fat, trans fat, and cholesterol is recommended to manage hypertension.  Take any prescribed medicines to control hypertension as directed by your health care provider.  Maintain a healthy weight.  Reducing calorie intake and making food choices that are low in sodium, saturated fat, trans fat, and cholesterol are recommended to manage weight.  Stop drug abuse.  Avoid taking birth control pills.  Talk to your health care provider about the risks of taking birth control pills if you are over 35 years old, smoke, get migraines, or have ever had a blood clot.  Get evaluated for sleep  disorders (sleep apnea).  Talk to your health care provider about getting a sleep evaluation if you snore a lot or have excessive sleepiness.  Take medicines only as directed by your health care provider.  For some people, aspirin or blood thinners (anticoagulants) are helpful in reducing the risk of forming abnormal blood clots that can lead to stroke. If you have the irregular heart rhythm of atrial fibrillation, you should be on a blood thinner unless there is a good reason you cannot take them.  Understand all your medicine instructions.  Make sure that other conditions (such as anemia or atherosclerosis) are addressed. SEEK IMMEDIATE MEDICAL CARE IF:   You have sudden weakness or numbness of the face, arm, or leg, especially on one side of the body.  Your face or eyelid droops to one side.  You have sudden confusion.  You have trouble speaking (aphasia) or understanding.  You have sudden trouble seeing in one or both eyes.  You have sudden trouble walking.  You have dizziness.  You have a loss of balance or coordination.  You have a sudden, severe headache with no known cause.  You have new chest pain or an irregular heartbeat. Any of these symptoms may represent a serious problem that is an emergency. Do not wait to see if the symptoms will go away. Get medical help at once. Call your local emergency services (911 in [place] Do not drive yourself to the hospital. Document Released: 08/17/2004 Document Revised: 11/24/2013 Document Reviewed: 01/10/2013 ExitCare Patient Information 2015 [employer]. This information is not intended to replace advice given   to you by your health care provider. Make sure you discuss any questions you have with your health care provider.   Venous Stasis or Chronic Venous Insufficiency Chronic venous insufficiency, also called venous stasis, is a condition that affects the veins in the legs. The condition prevents blood from being pumped  through these veins effectively. Blood may no longer be pumped effectively from the legs back to the heart. This condition can range from mild to severe. With proper treatment, you should be able to continue with an active life. CAUSES  Chronic venous insufficiency occurs when the vein walls become stretched, weakened, or damaged or when valves within the vein are damaged. Some common causes of this include:  High blood pressure inside the veins (venous hypertension).  Increased blood pressure in the leg veins from long periods of sitting or standing.  A blood clot that blocks blood flow in a vein (deep vein thrombosis).  Inflammation of a superficial vein (phlebitis) that causes a blood clot to form. RISK FACTORS Various things can make you more likely to develop chronic venous insufficiency, including:  Family history of this condition.  Obesity.  Pregnancy.  Sedentary lifestyle.  Smoking.  Jobs requiring long periods of standing or sitting in one place.  Being a certain age. Women in their 40s and 50s and men in their 70s are more likely to develop this condition. SIGNS AND SYMPTOMS  Symptoms may include:   Varicose veins.  Skin breakdown or ulcers.  Reddened or discolored skin on the leg.  Brown, smooth, tight, and painful skin just above the ankle, usually on the inside surface (lipodermatosclerosis).  Swelling. DIAGNOSIS  To diagnose this condition, your health care provider will take a medical history and do a physical exam. The following tests may be ordered to confirm the diagnosis:  Duplex ultrasound--A procedure that produces a picture of a blood vessel and nearby organs and also provides information on blood flow through the blood vessel.  Plethysmography--A procedure that tests blood flow.  A venogram, or venography--A procedure used to look at the veins using X-ray and dye. TREATMENT The goals of treatment are to help you return to an active life and to  minimize pain or disability. Treatment will depend on the severity of the condition. Medical procedures may be needed for severe cases. Treatment options may include:   Use of compression stockings. These can help with symptoms and lower the chances of the problem getting worse, but they do not cure the problem.  Sclerotherapy--A procedure involving an injection of a material that "dissolves" the damaged veins. Other veins in the network of blood vessels take over the function of the damaged veins.  Surgery to remove the vein or cut off blood flow through the vein (vein stripping or laser ablation surgery).  Surgery to repair a valve. HOME CARE INSTRUCTIONS   Wear compression stockings as directed by your health care provider.  Only take over-the-counter or prescription medicines for pain, discomfort, or fever as directed by your health care provider.  Follow up with your health care provider as directed. SEEK MEDICAL CARE IF:   You have redness, swelling, or increasing pain in the affected area.  You see a red streak or line that extends up or down from the affected area.  You have a breakdown or loss of skin in the affected area, even if the breakdown is small.  You have an injury to the affected area. SEEK IMMEDIATE MEDICAL CARE IF:   You have   an injury and open wound in the affected area.  Your pain is severe and does not improve with medicine.  You have sudden numbness or weakness in the foot or ankle below the affected area, or you have trouble moving your foot or ankle.  You have a fever or persistent symptoms for more than 2-3 days.  You have a fever and your symptoms suddenly get worse. MAKE SURE YOU:   Understand these instructions.  Will watch your condition.  Will get help right away if you are not doing well or get worse. Document Released: 11/13/2006 Document Revised: 04/30/2013 Document Reviewed: 03/17/2013 ExitCare Patient Information 2015 [employer].  This information is not intended to replace advice given to you by your health care provider. Make sure you discuss any questions you have with your health care provider.  

## 2014-08-18 NOTE — Telephone Encounter (Signed)
Left msg on triage requesting to speak with nurse. Called Jessica back no answer LMOM RTC...Raechel Chute

## 2014-08-18 NOTE — Progress Notes (Signed)
Established Carotid Patient   History of Present Illness  [name] is a 81 y.o. male patient of Dr. Edilia Bo who underwent a left CEA on 12/06/2004, presents today for carotid Duplex follow up. Patient reports he had a "light stroke" just before the CEA as manifested by running into doors for a couple of days, denies vision disturbance, states he might have been weak. Patient denies TIA or stroke symptoms since his CEA. Has never smoked. States he has had 2 "light" MI's, has one cardiac stent, Dr. Donnie Aho is his cardiologist. States he is very physically active. He has some tissue deformity of left forearm which pt. 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 He also has OA deformities of hands. The patient denies amaurosis fugax or monocular blindness. The patient denies facial drooping. Pt. denies hemiplegia. The patient denies receptive or expressive aphasia.  Patient denies claudication symptoms, denies non-healing wounds. He was hospitalized three times since November 2015 with dyspnea, UTI, and dehydration, home health nurse visits twice/week. Pt also seems to have bilateral LE chronic venous insufficiency. He has several pair knee high compression hose, states he does not wear, difficult to donn, does not know if his lower legs are less swollen in the morning.  Patient reports New Medical or Surgical History: UTI treated, hospitalized for 4 days at [facility], and also had lumbar surgery.  Pt reports he is seeing his podiatrist daily for a sore on the lateral aspect of his right foot.   Review of records: He was hospitalized at [facility] July 2015 with a UTI; carotid Duplex performed during that time with the following results: Study was technically limited due to patient body habitus and poor patient cooperation, therefore unable to assess the internal carotid arteries in their entirety. The right internal carotid artery demonstrates 1-39% stenosis. The left  internal carotid artery exhibits elevated velocities suggestive of upper range 1-39% stenosis. The left external carotid artery demonstrates elevated velocities. The right vertebral artery is patent with antegrade flow. Unable to visualize the left vertebral artery.  Pt Diabetic: Yes, takes no DM medications, apparently diet controlled  Pt meds include: Statin : No: not taken for several years due to myalgias Betablocker: Yes ASA: Yes Other anticoagulants/antiplatelets: Plavix   Past Medical History  Diagnosis Date  . Gout, unspecified     severe dz, "treatment done that last 15 years"  . Obesity (BMI 30-39.9)   . Restless leg syndrome   . History of retinal detachment 1994  . Hypertensive heart disease   . CAD (coronary artery disease), native coronary artery   . History of pericarditis 2007    MSSA, s/p pericardial window  . GERD   . Osteoarthritis   . Sleep apnea in adult     CPAP qhs  . TIA (transient ischemic attack) 2005  . BPH (benign prostatic hypertrophy)     "minor"  . Lumbar spinal stenosis   . Carotid artery occlusion   . Unspecified venous (peripheral) insufficiency   . Stroke May 2003  . Anxiety   . Depression   . Aortic stenosis     ECHO 05/29/14 moderate AS Mean gradient 21 mm EF 55% ECHO 07/09/13  Mean gradient 12 mm Mild to moderate AS EF 50%    . CAD (coronary artery disease)     02/11/09  Xience stent to circumflex 2.5 x 18 mm postdilated to 2.75 mm  Cath sept 2011  normal Left main, 20 % stenosis proximal LAD, 40%  stenosis mid LAD, 70% stenosis proximal Diag 1, 90% stenosis prox CFX, 90% first OM, small and nondominant RCA;   . Carotid artery disease     Prior TIA 2006 with left CEA by Dr. Arbie Cookey Recurrent TIA in April 2011   . Type 2 diabetes mellitus with vascular disease   . Lumbar disc disease   . CKD (chronic kidney disease), stage III 02/08/2014    Social History History  Substance Use Topics  . Smoking status: Never Smoker   . Smokeless  tobacco: Never Used  . Alcohol Use: No    Family History Family History  Problem Relation Age of Onset  . Heart disease Mother   . Diabetes Father   . Heart disease Father   . Breast cancer Sister   . Cancer Sister   . Prostate cancer Brother   . Lung cancer Brother   . Cancer Brother   . Diabetes Daughter   . Diabetes Son     Surgical History Past Surgical History  Procedure Laterality Date  . Pericardial window  2007  . Left arm  2008    shoulder  . Right arm  1970's    shoulder  . Cataract extraction      Left side x's 2 and right  . Retinal detachment surgery      left side  . Carotid endarterectomy Left 12-06-05    cea  . Coronary angioplasty  01/2009    x 1 stent  . Colonoscopy    . Carpal tunnel release Bilateral   . Back surgery  2013    removed bone spurs  . Inguinal hernia repair Left 10/01/2013    Procedure: HERNIA REPAIR INGUINAL ADULT;  Surgeon: Axel Filler, MD;  Location: [facility];  Service: General;  Laterality: Left;  . Insertion of mesh Left 10/01/2013    Procedure: INSERTION OF MESH;  Surgeon: Axel Filler, MD;  Location: [facility];  Service: General;  Laterality: Left;  . Finger amputation  2015    JUST TO FIRST JOINT RIGHT HAND  LAST FINGER    Allergies  Allergen Reactions  . Statins Other (See Comments)    Severe leg myalgias, weakness    Current Outpatient Prescriptions  Medication Sig Dispense Refill  . acetaminophen (TYLENOL) 325 MG tablet Take 2 tablets (650 mg total) by mouth every 6 (six) hours as needed for mild pain or moderate pain.    Marland Kitchen allopurinol (ZYLOPRIM) 300 MG tablet Take 300 mg by mouth every morning.    Marland Kitchen aspirin EC 81 MG tablet Take 81 mg by mouth daily.    . bisacodyl (DULCOLAX) 10 MG suppository Place 1 suppository (10 mg total) rectally daily as needed for moderate constipation. 12 suppository 0  . buPROPion (WELLBUTRIN XL) 150 MG 24 hr tablet Take 1 tablet (150 mg total) by mouth every morning. 30 tablet 0  .  cholecalciferol (VITAMIN D) 1000 UNITS tablet Take 1,000 Units by mouth daily.    . clopidogrel (PLAVIX) 75 MG tablet Take 1 tablet (75 mg total) by mouth daily. 90 tablet 3  . docusate sodium 100 MG CAPS Take 100 mg by mouth 2 (two) times daily. 10 capsule 0  . Ferrous Gluconate 256 (28 FE) MG TABS Take 256 mg by mouth daily.    . furosemide (LASIX) 40 MG tablet Take 1 tablet (40 mg total) by mouth daily.    Marland Kitchen ipratropium-albuterol (DUONEB) 0.5-2.5 (3) MG/3ML SOLN Take 3 mLs by nebulization every 6 (six) hours as needed (  Dyspnea or wheezing.).    Marland Kitchen LORazepam (ATIVAN) 1 MG tablet Take 1 tablet (1 mg total) by mouth every morning. 10 tablet 0  . Magnesium 250 MG TABS Take 250 mg by mouth daily.     . mirabegron ER (MYRBETRIQ) 50 MG TB24 tablet Take 50 mg by mouth daily.    . mirtazapine (REMERON) 15 MG tablet Take 1 tablet (15 mg total) by mouth at bedtime. 30 tablet 0  . pantoprazole (PROTONIX) 40 MG tablet Take 1 tablet (40 mg total) by mouth 2 (two) times daily. 30 tablet 0  . polyethylene glycol (MIRALAX / GLYCOLAX) packet Take 17 g by mouth 2 (two) times daily. 14 each 0  . pregabalin (LYRICA) 100 MG capsule Take 1 capsule (100 mg total) by mouth 2 (two) times daily. 60 capsule 0  . rOPINIRole (REQUIP) 4 MG tablet Take 1 tablet (4 mg total) by mouth at bedtime. 30 tablet 0  . tamsulosin (FLOMAX) 0.4 MG CAPS capsule Take 1 capsule (0.4 mg total) by mouth daily. 90 capsule 3   No current facility-administered medications for this visit.    Review of Systems : See HPI for pertinent positives and negatives.  Physical Examination  Filed Vitals:   08/18/14 1645 08/18/14 1647  BP: 150/63 150/68  Pulse: 63 62  Resp:  16  Height:  5\' 9"  (1.753 m)  Weight:  247 lb (112.038 kg)  SpO2:  94%   Body mass index is 36.46 kg/(m^2).  General: WDWN obese male in NAD GAIT: normal Eyes: Left pupil is larger than right, pt. States due to hx of detached retina in left eye Pulmonary: CTAB but  diminished air movement in all fields, Negative Rales, Negative rhonchi, & Negative wheezing. Mild dyspnea at rest.  Cardiac: regular Rhythm, positive murmur.  VASCULAR EXAM Carotid Bruits Left Right   Cardiac murmur transmitted Cardiac murmur transmitted   Aorta is not palpable. Radial pulses are 2+ palpable and equal. Femoral pulses are not palpable but large panus present. Popliteal pulses are not palpable. Posterior tibial pulses are not palpable but 2+ bilateral pitting edema is present. Dorsalis pedis pulses ar faintly palpable.   Lower legs with 2+ pitting edema and chronic venous stasis skin changes.  Stretch adhesive dressing around right mid foot, pt sees his podiatrist weekly for right foot lateral wound evaluation and treatment.      Gastrointestinal: soft, nontender, BS WNL, no r/g,no palpable masses, large panus.  Musculoskeletal: Positive muscle atrophy/wasting on left forearm s/p surgical procedure, see HPI. M/S 5/5 throughout, Extremities without ischemic changes. Hands with moderate OA/gout deformities.  Neurologic: A&O X 3; Appropriate Affect, Speech is normal CN 2-12 intact except, Pain and light touch intact in extremities, Motor exam as listed above.    Non-Invasive Vascular Imaging CAROTID DUPLEX 08/18/2014   CEREBROVASCULAR DUPLEX EVALUATION    INDICATION: Follow-up carotid disease     PREVIOUS INTERVENTION(S): Left carotid endarterectomy 12/06/2005    DUPLEX EXAM:     RIGHT  LEFT  Peak Systolic Velocities (cm/s) End Diastolic Velocities (cm/s) Plaque LOCATION Peak Systolic Velocities (cm/s) End Diastolic Velocities (cm/s) Plaque  75 9  CCA PROXIMAL 200 20   60 12  CCA MID 111 13   425 69 HT/CP CCA DISTAL 132 21   475 21 HT/CP ECA 267 0   337 35 HT/CP ICA PROXIMAL 103 24   98 19  ICA MID 104 23   93 22  ICA  DISTAL 111 27  NA ICA / CCA Ratio (PSV) NA  Antegrade  Vertebral Flow Antegrade    Brachial Systolic Pressure (mmHg)   Within normal limits  Brachial Artery Waveforms Within normal limits     Plaque Morphology:  HM = Homogeneous, HT = Heterogeneous, CP = Calcific Plaque, SP = Smooth Plaque, IP = Irregular Plaque     ADDITIONAL FINDINGS: Very technically difficult study due to thick neck, high bifurcation, and acoustic shadow.    IMPRESSION: 1. Unable to identify exact bifurcation due to acoustic shadow. There is evidence of significant stenosis in the distal common carotid artery and external carotid artery, unable to thoroughly evaluate proximal internal carotid artery; however, waveforms in the mid segment display post stenotic turbulence. 2. Widely patent left carotid endarterectomy without evidence of restenosis or hyperplasia. 3. Bilateral vertebral artery is antegrade.     Compared to the previous exam:  Velocities on the right have increased significantly compared to previous exam.    Review of records: He was hospitalized at [facility] July 2015 with a UTI; carotid Duplex performed during that time with the following results: Study was technically limited due to patient body habitus and poor patient cooperation, therefore unable to assess the internal carotid arteries in their entirety. The right internal carotid artery demonstrates 1-39% stenosis. The left internal carotid artery exhibits elevated velocities suggestive of upper range 1-39% stenosis. The left external carotid artery demonstrates elevated velocities. The right vertebral artery is patent with antegrade flow. Unable to visualize the left vertebral artery.   Assessment: [name] is a 81 y.o. male who is s/p  left CEA on 12/06/2004. He had TIA's just prior to the 2006 CEA, none since then, no stroke history. Today's carotid Duplex was a very technically difficult study due to thick neck, high bifurcation, and  acoustic shadow. Unable to identify exact bifurcation due to acoustic shadow. There is evidence of significant stenosis in the distal common carotid artery and external carotid artery, unable to thoroughly evaluate proximal internal carotid artery; however, waveforms in the mid segment display post stenotic turbulence. Widely patent left carotid endarterectomy without evidence of restenosis or hyperplasia. Bilateral vertebral artery is antegrade. Velocities on the right have increased significantly compared to previous exam.  He was hospitalized 3 times since November 2015 with a UTI, dehydration, and dyspnea; home health nurse visits twice/week.  Chronic venous stasis changes in LE's: does not wear knee high compression hose that he has.  Face to face time with patient was 25 minutes. Over 50% of this time was spent on counseling and coordination of care.    Plan: Follow-up in 1 year with Carotid Duplex.   I discussed in depth with the patient the nature of atherosclerosis, and emphasized the importance of maximal medical management including strict control of blood pressure, blood glucose, and lipid levels, obtaining regular exercise, and continued cessation of smoking.  The patient is aware that without maximal medical management the underlying atherosclerotic disease process will progress, limiting the benefit of any interventions. The patient was given information about stroke prevention and what symptoms should prompt the patient to seek immediate medical care. Elevation of feet above heart when pt is not walking and donn knee high compression hose in the morning when his lower legs are less edemetous Thank you for allowing [place] to participate in this patient's care.  Charisse March, RN, MSN, FNP-C [facility] Office: [phone]  Clinic Physician: Early  08/18/2014  4:39 PM

## 2014-08-18 NOTE — Telephone Encounter (Signed)
Shanda Bumps return call back she stated went out to see pt and he has a 4 pd weight gain since she was out their week and half ago. They did have a talk about his food intake, but she does feel like he has a little bit of fluid because his ankles was a little swollen. Also pt was d/c with Duo-neb treatments twice a day, but pt doesn't have a machine. Inform Shanda Bumps pt see pulmonologist will have to contact them for that order. Will contact Dr. Felicity Coyer again on Thursday if their has been some significant change in pt weight...Raechel Chute

## 2014-08-19 NOTE — Addendum Note (Signed)
Addended by: Sharee Pimple on: 08/19/2014 04:17 PM   Modules accepted: Orders

## 2014-08-21 ENCOUNTER — Telehealth: Payer: Self-pay | Admitting: Internal Medicine

## 2014-08-21 NOTE — Telephone Encounter (Signed)
[name] [phone] [facility]   4 pound weight gain again.    Couple of issue they need to talk to nurse about

## 2014-08-24 NOTE — Telephone Encounter (Signed)
LVM for [name] to call back.

## 2014-08-27 ENCOUNTER — Ambulatory Visit (INDEPENDENT_AMBULATORY_CARE_PROVIDER_SITE_OTHER): Payer: Medicare Other | Admitting: Podiatry

## 2014-08-27 ENCOUNTER — Encounter: Payer: Self-pay | Admitting: Podiatry

## 2014-08-27 ENCOUNTER — Ambulatory Visit (INDEPENDENT_AMBULATORY_CARE_PROVIDER_SITE_OTHER): Payer: Medicare Other

## 2014-08-27 VITALS — BP 174/68 | HR 73 | Resp 15 | Ht 69.0 in | Wt 235.0 lb

## 2014-08-27 DIAGNOSIS — L02611 Cutaneous abscess of right foot: Secondary | ICD-10-CM

## 2014-08-27 DIAGNOSIS — M779 Enthesopathy, unspecified: Secondary | ICD-10-CM | POA: Diagnosis not present

## 2014-08-27 DIAGNOSIS — L03031 Cellulitis of right toe: Secondary | ICD-10-CM | POA: Diagnosis not present

## 2014-08-27 DIAGNOSIS — I6523 Occlusion and stenosis of bilateral carotid arteries: Secondary | ICD-10-CM

## 2014-08-27 MED ORDER — CEPHALEXIN 500 MG PO CAPS: 500.0000 mg | ORAL_CAPSULE | Freq: Four times a day (QID) | ORAL | Status: DC

## 2014-08-27 NOTE — Progress Notes (Signed)
   Subjective:    Patient ID: [name], male    DOB: 12-Apr-1933, 81 y.o.   MRN: [id number]  HPI Comments: Pt states the right 5th MPJ has hurt and been red for about 2 months.  Pt states he went to the [facility] and they only gave him pads to cover the area.  Foot Pain      Review of Systems  Respiratory: Positive for shortness of breath.   Cardiovascular: Positive for leg swelling.  Genitourinary: Positive for urgency and frequency.  Musculoskeletal: Positive for back pain and gait problem.  Hematological: Bruises/bleeds easily.  All other systems reviewed and are negative.      Objective:   Physical Exam        Assessment & Plan:

## 2014-08-28 NOTE — Progress Notes (Signed)
Subjective:     Patient ID: [name], male   DOB: 1933/07/24, 81 y.o.   MRN: [id number]  HPI Asian states he has irritation around the right fifth metatarsal head and he is quite obese and in very poor health overall and states that while is not draining it's just been sore   Review of Systems  All other systems reviewed and are negative.      Objective:   Physical Exam  Constitutional: He is oriented to person, place, and time.  Musculoskeletal: Normal range of motion.  Neurological: He is oriented to person, place, and time.  Skin: Skin is warm and dry.  Nursing note and vitals reviewed.  neurovascular status is found to be diminished but intact with diminished sharp Dole vibratory and pulses weak but present. Digits are well perfused but he has moderate edema in his feet secondary to obesity and varicosities in the ankle of both feet with no pitting edema noted. On the lateral aspect of the right fifth metatarsal there is a very small area that's crusted with no drainage odor or indications of active infection and there is slight redness surrounding the area with very tight tissue noted     Assessment:     Patient is in poor health with edema in his feet and has slight stress on the right fifth metatarsal with irritation around the bone surface    Plan:     H&P and x-ray reviewed and condition explained to patient. At this point I have recommended going without shoes in order to create and reduce stress against this area along with soaks and padding. Hopefully this will respond on its own but unfortunately there is not a lot of other things currently we can do for this without taking all the pressure off the area reappoint if symptoms persist

## 2014-09-09 ENCOUNTER — Other Ambulatory Visit: Payer: Self-pay | Admitting: Internal Medicine

## 2014-09-09 NOTE — Telephone Encounter (Signed)
Called [place] RN Sherri, Stated that pt needs rx refills. Pt has new insurance and is worried about what to do.  He does not want to use [facility] for refills. He is worried about what pharmacy will take his new rx plan.   Called pt and he is going to find out if [facility] is the rx drug store he wants to use.

## 2014-09-09 NOTE — Telephone Encounter (Signed)
Sherri , [facility] called in and has question about pt meds  [phone]

## 2014-09-09 NOTE — Telephone Encounter (Signed)
Pt is changing to [facility]. This has been updated.  He is requesting a refill of lorazepam, pregabalin, and requip.  Okay to fill? What is the # for lorazepam?

## 2014-09-10 ENCOUNTER — Other Ambulatory Visit: Payer: Self-pay | Admitting: Family

## 2014-09-10 ENCOUNTER — Encounter: Payer: Self-pay | Admitting: Internal Medicine

## 2014-09-10 ENCOUNTER — Ambulatory Visit (INDEPENDENT_AMBULATORY_CARE_PROVIDER_SITE_OTHER): Payer: Medicare Other | Admitting: Internal Medicine

## 2014-09-10 VITALS — BP 158/78 | HR 73 | Temp 97.4°F | Ht 69.0 in | Wt 246.8 lb

## 2014-09-10 DIAGNOSIS — I5032 Chronic diastolic (congestive) heart failure: Secondary | ICD-10-CM

## 2014-09-10 DIAGNOSIS — E1159 Type 2 diabetes mellitus with other circulatory complications: Secondary | ICD-10-CM

## 2014-09-10 DIAGNOSIS — G4733 Obstructive sleep apnea (adult) (pediatric): Secondary | ICD-10-CM

## 2014-09-10 DIAGNOSIS — F411 Generalized anxiety disorder: Secondary | ICD-10-CM

## 2014-09-10 DIAGNOSIS — I6523 Occlusion and stenosis of bilateral carotid arteries: Secondary | ICD-10-CM

## 2014-09-10 DIAGNOSIS — E1151 Type 2 diabetes mellitus with diabetic peripheral angiopathy without gangrene: Secondary | ICD-10-CM

## 2014-09-10 DIAGNOSIS — Z23 Encounter for immunization: Secondary | ICD-10-CM

## 2014-09-10 MED ORDER — LORAZEPAM 1 MG PO TABS: 1.0000 mg | ORAL_TABLET | Freq: Three times a day (TID) | ORAL | Status: DC | PRN

## 2014-09-10 MED ORDER — PREGABALIN 100 MG PO CAPS: 100.0000 mg | ORAL_CAPSULE | Freq: Two times a day (BID) | ORAL | Status: DC

## 2014-09-10 MED ORDER — PREGABALIN 200 MG PO CAPS: 200.0000 mg | ORAL_CAPSULE | Freq: Every day | ORAL | Status: DC

## 2014-09-10 MED ORDER — MIRTAZAPINE 15 MG PO TABS: 15.0000 mg | ORAL_TABLET | Freq: Every day | ORAL | Status: AC

## 2014-09-10 MED ORDER — TAMSULOSIN HCL 0.4 MG PO CAPS: 0.4000 mg | ORAL_CAPSULE | Freq: Every day | ORAL | Status: DC

## 2014-09-10 MED ORDER — METOPROLOL SUCCINATE ER 50 MG PO TB24: 50.0000 mg | ORAL_TABLET | Freq: Every day | ORAL | Status: DC

## 2014-09-10 MED ORDER — ROPINIROLE HCL 4 MG PO TABS: 4.0000 mg | ORAL_TABLET | Freq: Every day | ORAL | Status: DC

## 2014-09-10 NOTE — Assessment & Plan Note (Signed)
Acute on chronic resp failure 05/2014 and 06/2014 hosp reviewed with VDRF euvolemic - continue lasix Resume beta-blocker follow up cards as planned

## 2014-09-10 NOTE — Telephone Encounter (Signed)
All rx requests reviewed - updated disp/refill as indicated for each thanks

## 2014-09-10 NOTE — Progress Notes (Signed)
Pre visit review using our clinic review tool, if applicable. No additional management support is needed unless otherwise documented below in the visit note. 

## 2014-09-10 NOTE — Assessment & Plan Note (Signed)
Reviewed variable compliance with CPAP at home prior to VDRF admission 11 and 06/2014 Reports using nightly since DC home 07/2014 follow up pulm/sleep as planned Chronic sleep disorder also affected by chronic pain, RLS and mood/anxiety

## 2014-09-10 NOTE — Assessment & Plan Note (Signed)
Overlap anxiety and depression Uses chronic BID lorazepam and wellbutrin + remeron qhs The current medical regimen is effective;  continue present plan and medications. Refills provided  

## 2014-09-10 NOTE — Progress Notes (Signed)
Subjective:    Patient ID: [name], male    DOB: 03/02/1933, 81 y.o.   MRN: [id number]  HPI  Patient here for follow up  Reviewed chronic medical issues and interval medical events including repeat hospitalization November and December 2015 for acute on chronic respiratory failure  Past Medical History  Diagnosis Date  . Gout, unspecified     severe dz, "treatment done that last 15 years"  . Obesity (BMI 30-39.9)   . Restless leg syndrome   . History of retinal detachment 1994  . Hypertensive heart disease   . CAD (coronary artery disease), native coronary artery   . History of pericarditis 2007    MSSA, s/p pericardial window  . GERD   . Osteoarthritis   . Sleep apnea in adult     CPAP qhs  . TIA (transient ischemic attack) 2005  . BPH (benign prostatic hypertrophy)     "minor"  . Lumbar spinal stenosis   . Carotid artery occlusion   . Unspecified venous (peripheral) insufficiency   . Stroke May 2003  . Anxiety   . Depression   . Aortic stenosis     ECHO 05/29/14 moderate AS Mean gradient 21 mm EF 55% ECHO 07/09/13  Mean gradient 12 mm Mild to moderate AS EF 50%    . CAD (coronary artery disease)     02/11/09  Xience stent to circumflex 2.5 x 18 mm postdilated to 2.75 mm  Cath sept 2011  normal Left main, 20 % stenosis proximal LAD, 40% stenosis mid LAD, 70% stenosis proximal Diag 1, 90% stenosis prox CFX, 90% first OM, small and nondominant RCA;   . Carotid artery disease     Prior TIA 2006 with left CEA by Dr. Arbie Cookey Recurrent TIA in April 2011   . Type 2 diabetes mellitus with vascular disease   . Lumbar disc disease   . CKD (chronic kidney disease), stage III 02/08/2014    Review of Systems  Constitutional: Negative for fever, fatigue and unexpected weight change.  Respiratory: Positive for shortness of breath (DOE, chronic, baseline). Negative for cough.   Cardiovascular: Negative for chest pain and palpitations. Leg swelling: no change in chronic sx.    Psychiatric/Behavioral: Positive for sleep disturbance (poor rest at night). Negative for dysphoric mood.       Objective:    Physical Exam  Constitutional: He appears well-developed and well-nourished. No distress.  Cardiovascular: Normal rate, regular rhythm and normal heart sounds.   No murmur heard. Pulmonary/Chest: Effort normal and breath sounds normal. He has no wheezes.    BP 158/78 mmHg  Pulse 73  Temp(Src) 97.4 F (36.3 C) (Oral)  Ht  (1.753 m)  Wt 246 lb 12 oz (111.925 kg)  BMI 36.42 kg/m2  SpO2 94% Wt Readings from Last 3 Encounters:  09/10/14 246 lb 12 oz (111.925 kg)  08/27/14 235 lb (106.595 kg)  08/18/14 247 lb (112.038 kg)     Lab Results  Component Value Date   WBC 5.6 08/05/2014   HGB 15.1 08/05/2014   HCT 47.8 08/05/2014   PLT 163.0 08/05/2014   GLUCOSE 99 08/05/2014   CHOL 190 07/15/2014   TRIG 149 07/15/2014   HDL 32* 07/15/2014   LDLDIRECT 134.4 02/06/2013   LDLCALC 128* 07/15/2014   ALT 17 07/17/2014   AST 27 07/17/2014   NA 139 08/05/2014   K 4.9 08/05/2014   CL 102 08/05/2014   CREATININE 1.26 08/05/2014   BUN 21 08/05/2014  CO2 30 08/05/2014   TSH 3.608 07/14/2014   INR 1.07 05/29/2014   HGBA1C 6.2* 07/15/2014   MICROALBUR 0.9 01/19/2014    No results found.     Assessment & Plan:   Problem List Items Addressed This Visit    Anxiety state (Chronic)    Overlap anxiety and depression Uses chronic BID lorazepam and wellbutrin + remeron qhs The current medical regimen is effective;  continue present plan and medications. Refills provided      Relevant Medications   mirtazapine (REMERON) tablet   Chronic diastolic heart failure - Primary (Chronic)    Acute on chronic resp failure 05/2014 and 06/2014 hosp reviewed with VDRF euvolemic - continue lasix Resume beta-blocker follow up cards as planned      Relevant Medications   metoprolol succinate (TOPROL-XL) 24 hr tablet   Obstructive sleep apnea, adult (Chronic)     Reviewed variable compliance with CPAP at home prior to VDRF admission 11 and 06/2014 Reports using nightly since DC home 07/2014 follow up pulm/sleep as planned Chronic sleep disorder also affected by chronic pain, RLS and mood/anxiety          Time spent with pt today 25 minutes, greater than 50% time spent counseling patient on diabetes, respiratory failure related to pulmonary and cardiac disease November and December 2015, chronic anxiety and medication review. Also review of prior records   [name], MD

## 2014-09-10 NOTE — Assessment & Plan Note (Signed)
Steroid induced in past - no steroids since 2011 Remains "diet controlled" at present Recheck a1c q6-12 mo and consider metformin or other med if a1c>7 The patient is asked to make an attempt to improve diet and exercise patterns to aid in medical management of this problem.  Lab Results  Component Value Date   HGBA1C 6.2* 07/15/2014

## 2014-09-10 NOTE — Patient Instructions (Signed)
It was good to see you today.  We have reviewed your prior records including labs and tests today  Prevnar 13 pneumonia vaccine updated today   Medications reviewed and updated Resume Toprol 50 mg once daily. No other changes recommended at this time. Refill on medication(s) as discussed today.  Continue working with your other specialist as reviewed and ongoing  Please schedule followup in 3-4 months, call sooner if problems.

## 2014-09-11 ENCOUNTER — Encounter: Payer: Self-pay | Admitting: Pulmonary Disease

## 2014-09-11 ENCOUNTER — Ambulatory Visit (INDEPENDENT_AMBULATORY_CARE_PROVIDER_SITE_OTHER): Payer: Medicare Other | Admitting: Pulmonary Disease

## 2014-09-11 VITALS — BP 154/78 | HR 67 | Temp 97.8°F | Ht 69.0 in | Wt 244.8 lb

## 2014-09-11 DIAGNOSIS — I6523 Occlusion and stenosis of bilateral carotid arteries: Secondary | ICD-10-CM

## 2014-09-11 DIAGNOSIS — E662 Morbid (severe) obesity with alveolar hypoventilation: Secondary | ICD-10-CM

## 2014-09-11 DIAGNOSIS — G4733 Obstructive sleep apnea (adult) (pediatric): Secondary | ICD-10-CM

## 2014-09-11 NOTE — Patient Instructions (Signed)
Stay on cpap everynight, and keep up with your mask changes and supplies. Work on weight loss followup with me again in one year if doing well.

## 2014-09-11 NOTE — Assessment & Plan Note (Signed)
The patient has a history of hypercarbic respiratory failure at his last hospitalization, but it appeared to be primarily acute in nature. It was associated with acute on chronic heart failure, and the patient was also felt to be taking too many sedating medications. I suspect he does have some mild chronic hypercarbia at baseline.

## 2014-09-11 NOTE — Assessment & Plan Note (Signed)
The patient feels that he is doing much better with his C Pap, and is wearing nightly by his history. He denies any issues with mask fit or pressure, and I have reminded him of the need to keep up with his cushion changes and filters. I have also encouraged him to work aggressively on weight loss.

## 2014-09-11 NOTE — Progress Notes (Signed)
   Subjective:    Patient ID: [name], male    DOB: [birth year], 81 y.o.   MRN: [id number]  HPI Patient comes in today for follow-up of his obstructive sleep apnea, and he is also felt to have an element of OHS. He feels that he is doing much better with the C Pap after getting a new mask and supplies, and denies any issues with his mask fit or pressure. He tells me that he is wearing the device nightly, and is satisfied with his daytime alertness.   Review of Systems  Constitutional: Negative for fever and unexpected weight change.  HENT: Negative for congestion, dental problem, ear pain, nosebleeds, postnasal drip, rhinorrhea, sinus pressure, sneezing, sore throat and trouble swallowing.   Eyes: Negative for redness and itching.  Respiratory: Positive for shortness of breath. Negative for cough, chest tightness and wheezing.   Cardiovascular: Negative for palpitations and leg swelling.  Gastrointestinal: Negative for nausea and vomiting.  Genitourinary: Negative for dysuria.  Musculoskeletal: Negative for joint swelling.  Skin: Negative for rash.  Neurological: Negative for headaches.  Hematological: Does not bruise/bleed easily.  Psychiatric/Behavioral: Negative for dysphoric mood. The patient is not nervous/anxious.        Objective:   Physical Exam Obese male in no acute distress Nose without purulence or discharge noted Neck without lymphadenopathy or thyromegaly No skin breakdown or pressure necrosis from the C Pap mask Lower extremities with edema noted, no cyanosis Alert and oriented, moves all 4 extremities.       Assessment & Plan:

## 2014-09-14 ENCOUNTER — Other Ambulatory Visit: Payer: Self-pay

## 2014-09-15 DIAGNOSIS — I5032 Chronic diastolic (congestive) heart failure: Secondary | ICD-10-CM | POA: Diagnosis not present

## 2014-09-16 ENCOUNTER — Telehealth: Payer: Self-pay

## 2014-09-16 NOTE — Telephone Encounter (Signed)
Called pt but no answer/phone disconnected.  Called Sherri with [facility] and gave PCP orders for lasix 40 mg bid x 5 days.   Called pt daughter Stanton Kidney at [phone] and left detailed message regarding rx change.   Verbal okay for [facility] to go back on Friday to re-eval pt condition

## 2014-09-16 NOTE — Telephone Encounter (Signed)
Sherry with [facility] called: [phone]  Pt has gain 5 lbs in last week.  Ankle size has increased from 57" to 62".   Lasix is currently at 40 mg qd

## 2014-09-18 ENCOUNTER — Other Ambulatory Visit: Payer: Self-pay | Admitting: *Deleted

## 2014-09-18 MED ORDER — BUPROPION HCL ER (XL) 150 MG PO TB24: 150.0000 mg | ORAL_TABLET | Freq: Every morning | ORAL | Status: DC

## 2014-09-18 NOTE — Telephone Encounter (Signed)
[name] stated that ankles are a lot better and are close to original measurement.    Rt outer foot, .5x.5x.1 open sore. Pt has had for a while. Not red and does not look infect but has a small amount of sera drainage. [name] wants to know if they can have verbal to manage that wound. The current MD taking care of stated that there was nothing else that they can do.

## 2014-09-19 NOTE — Telephone Encounter (Signed)
Verbal ok for wound care

## 2014-09-21 NOTE — Telephone Encounter (Signed)
[facility] RN Sherri called and given verbal okay per PCP for wound care.

## 2014-09-22 DIAGNOSIS — I5032 Chronic diastolic (congestive) heart failure: Secondary | ICD-10-CM

## 2014-09-23 DIAGNOSIS — I5032 Chronic diastolic (congestive) heart failure: Secondary | ICD-10-CM | POA: Diagnosis not present

## 2014-10-01 ENCOUNTER — Encounter: Payer: Self-pay | Admitting: Cardiology

## 2014-10-01 NOTE — Progress Notes (Signed)
Patient ID: [name], male   DOB: [birth year], 81 y.o.   MRN: [id number]  [name]  Date of visit:  10/01/2014 DOB:  07/02/1933    Age:  81 yrs. Medical record number:  [id number]     Account number:  [id number] Primary Care Provider: [name] [name] ____________________________ CURRENT DIAGNOSES  1. Atherosclerotic heart disease of native coronary artery without angina pectoris  2. Aortic valve stenosis  3. Presence of coronary angioplasty implant and graft  4. Chronic kidney disease, stage 3 (moderate)  5. Chronic diastolic heart failure  6. Type 2 diabetes mellitus with diabetic nephropathy  7. Other obesity  8. Other hyperlipidemia  9. Sleep apnea  10. Hypertensive heart disease without heart failure  11. Restless legs syndrome  12. Idiopathic gout, multiple sites  13. Shortness of breath  14. Personal history of transient ischemic attack (TIA), and cerebral infarction without residual deficits  15. Occlusion and stenosis of right carotid artery ____________________________ ALLERGIES  Atorvastatin, Muscle aches  Febuxostat, Rash  Simvastatin, Muscle aches ____________________________ MEDICATIONS  1. Requip 4 mg Tablet, 1 p.o. daily  2. Vitamin D 1,000 unit Capsule, 1 p.o. daily  3. mirtazapine 15 mg Tablet, Rapid Dissolve, 1 p.o. daily  4. lorazepam 1 mg Tablet, 1 p.o. daily  5. Wellbutrin SR 150 mg tablet extended release, 1 p.o. daily  6. allopurinol 300 mg tablet, 1 p.o. daily  7. ferrous gluconate 256 mg (28 mg iron) tablet, 1 p.o. daily  8. magnesium 250 mg tablet, 1 p.o. daily  9. clopidogrel 75 mg tablet, 1 p.o. daily  10. aspirin 81 mg chewable tablet, 1 p.o. daily  11. tamsulosin ER 0.4 mg capsule,extended release 24 hr, 1 p.o. daily  12. ipratropium-albuterol 0.5 mg-3 mg(2.5 mg base)/3 mL nebulization soln, PRN  13. Myrbetriq 50 mg tablet,extended release, 1 p.o. daily  14. Colace 100 mg capsule, BID  15. Lyrica 200 mg capsule, 1 p.o. daily  16.  pantoprazole 40 mg tablet,delayed release, 1 p.o. daily  17. amlodipine 5 mg tablet, 1 p.o. daily  18. furosemide 40 mg tablet, BID ____________________________ CHIEF COMPLAINTS  hypertension poorly controlled ____________________________ HISTORY OF PRESENT ILLNESS Patient seen early for evaluation of significant hypertension. Since he was seen in January he has been seen by the home health nurse and has had persistent elevation of systolic hypertension and it is up in the office today. He has some mild dyspnea. He has not had any recurrent hospitalizations since he was here. He denies angina and has no PND, orthopnea or claudication. He has not been using any nonsteroidal anti-inflammatory agents. He has a ulcer on the outside of his right foot that is being attended to by the nurses as well as [name]. His weight is up 7 pounds from his last weight appeared. He was placed back on beta blockers by his primary physician when his pulse rate was elevated. ____________________________ PAST HISTORY  Past Medical Illnesses:  hypertension, hyperlipidemia, obesity, gout, TIA, restless legs, BPH, right brain CVA, osteoarthritis, sleep apnea;  Cardiovascular Illnesses:  CAD, staph pericarditis 11/07 with window, history of  carotid atery disease;  Surgical Procedures:  cataract extraction OU, detached retina, carpal tunnel release, shoulder repair-rt, nasal surg, carotid endarterectomy-left;  NYHA Classification:  II;  Canadian Angina Classification:  Class 0: Asymptomatic;  Cardiology Procedures-Invasive:  pericardial window 06/2006, cardiac cath (left) July 2010, Xience DES stent  circumflex  July 2010  Dr. Excell [name], cardiac cath (left) September 2011;  Cardiology Procedures-Noninvasive:  regadenoson  thallium July 2010, echocardiogram May 2013, echocardiogram January 2014, echocardiogram December 2014;  Cardiac Cath Results:  normal Left main, 20 % stenosis proximal LAD, 40% stenosis mid LAD, 70%  stenosis proximal Diag 1, 90% stenosis prox CFX, 90% first OM, small and nondominant RCA;  LVEF of 55% documented via echocardiogram on 05/29/2014,   ____________________________ CARDIO-PULMONARY TEST DATES EKG Date:  08/26/2013;   Cardiac Cath Date:  04/15/2010;  Stent Placement Date: 02/11/2009;  Nuclear Study Date:  02/04/2009;  Echocardiography Date: 05/29/2014;  Chest Xray Date: 07/17/2014;   ____________________________ FAMILY HISTORY Brother -- Brother dead, Carcinoma of the pancreas Father -- Father dead, Coronary Artery Disease, Deceased Mother -- Mother dead, Coronary Artery Disease Sister -- Sister alive and well Sister -- Sister dead, Chronic obstructive lung disease Sister -- Sister dead, Cancer Sister -- Sister alive with problem, Dementia/Alzheimer's ____________________________ SOCIAL HISTORY Alcohol Use:  no alcohol use;  Smoking:  never smoked;  Diet:  regular diet;  Lifestyle:  widower;  Exercise:  no regular exercise;  Occupation:  retired and [employer];  Residence:  lives with daughter;   ____________________________ REVIEW OF SYSTEMS General:  obesity  Integumentary:easy bruisability Eyes: history of retinal detachment, cataract extraction O.U. Respiratory: mild dyspnea with exertion Cardiovascular:  please review HPI Abdominal: see HPI Genitourinary-Male: nocturia, erectile dysfunction  Musculoskeletal:  arthritis of the shoulder, arthritis of the neck, restless legs, chronic low back pain Neurological:  left arm weakness  ____________________________ PHYSICAL EXAMINATION VITAL SIGNS  Blood Pressure:  188/86 Sitting, Left arm, regular cuff  , 190/80 Standing, Left arm and regular cuff   Pulse:  76/min. Weight:  244.00 lbs. Height:  70"BMI: 35  Constitutional:  pleasant white male in no acute distress, moderately obese Skin:  scattered hemangiomas, multiple seborrhic keratosis Head:  normocephalic, normal hair pattern, no masses or tenderness ENT:   rosacea, several missing teeth Neck:  supple, no masses, thyromegaly, JVD. Carotid pulses are full and equal bilaterally without bruits., healed left carotid endarterectomy scar Chest:  normal symmetry, clear to auscultation. Cardiac:  regular rhythm, normal S1 and S2, no S3 or S4, grade 2/6 systolic murmur left sternal border Peripheral Pulses:  pulses full and equal in all extremities Extremities & Back:  large tophaceous knot on right knee and elbows, 1+ edema, bilateral venous insufficiency changes present, amputation of part of fifth finger Neurological:  weakness left hand ____________________________ MOST RECENT LIPID PANEL 07/09/13  CHOL TOTL 192 mg/dl, LDL 682 NM, HDL 25 mg/dl, TRIGLYCER 574 mg/dl and CHOL/HDL 7.8 (Calc) ____________________________ IMPRESSIONS/PLAN  1. Hypertensive heart disease with poorly controlled hypertension 2. Coronary artery disease with previous stents 3. Obesity with weight gain since here 4. Prior stroke 5. Chronic diastolic heart failure  Recommendations:  Blood pressure is poorly controlled. I asked him in light of the weight gain to increase his furosemide to 40 mg twice daily. Amlodipine 5 mg daily to his regimen. Followup in one month. Records from primary M.D. reviewed. ____________________________ TODAYS ORDERS  1. Return Visit: 1 month                       ____________________________ Cardiology Physician:  Darden Palmer MD [facility]

## 2014-10-02 ENCOUNTER — Encounter: Payer: Self-pay | Admitting: Internal Medicine

## 2014-10-06 ENCOUNTER — Telehealth: Payer: Self-pay | Admitting: Internal Medicine

## 2014-10-06 MED ORDER — ALLOPURINOL 300 MG PO TABS: 300.0000 mg | ORAL_TABLET | Freq: Every morning | ORAL | Status: DC

## 2014-10-06 MED ORDER — ROPINIROLE HCL 4 MG PO TABS: 4.0000 mg | ORAL_TABLET | Freq: Every day | ORAL | Status: DC

## 2014-10-06 MED ORDER — PANTOPRAZOLE SODIUM 40 MG PO TBEC: 40.0000 mg | DELAYED_RELEASE_TABLET | Freq: Two times a day (BID) | ORAL | Status: DC

## 2014-10-06 MED ORDER — BUPROPION HCL ER (XL) 150 MG PO TB24: 150.0000 mg | ORAL_TABLET | Freq: Every morning | ORAL | Status: DC

## 2014-10-06 MED ORDER — MIRABEGRON ER 50 MG PO TB24: 50.0000 mg | ORAL_TABLET | Freq: Every day | ORAL | Status: DC

## 2014-10-06 MED ORDER — PREGABALIN 200 MG PO CAPS: 200.0000 mg | ORAL_CAPSULE | Freq: Every day | ORAL | Status: DC

## 2014-10-06 NOTE — Telephone Encounter (Signed)
Patient requesting a 90 day supply of the following sent to [facility]   rOPINIRole (REQUIP) 4 MG tablet [[id number]]  buPROPion (WELLBUTRIN XL) 150 MG 24 hr tablet [[id number]]    pantoprazole (PROTONIX) 40 MG tablet [[id number]]     allopurinol (ZYLOPRIM) 300 MG tablet [[id number]]  pregabalin (LYRICA) 200 MG capsule [[id number]]  mirabegron ER (MYRBETRIQ) 50 MG TB24 tablet [[id number]]

## 2014-10-06 NOTE — Telephone Encounter (Signed)
erx has been done with the exception of the lyrica. That one is printed and will be signed by PCP and then faxed to pg.

## 2014-10-13 ENCOUNTER — Telehealth: Payer: Self-pay | Admitting: Internal Medicine

## 2014-10-13 MED ORDER — COLLAGENASE 250 UNIT/GM EX OINT: 1.0000 "application " | TOPICAL_OINTMENT | Freq: Four times a day (QID) | CUTANEOUS | Status: DC

## 2014-10-13 NOTE — Telephone Encounter (Signed)
[name] from [facility] called in and needs a call back about a wound on is right foot .  They need Dr Felicity Coyer to call something in for it.      [name] number  [phone]

## 2014-10-13 NOTE — Telephone Encounter (Signed)
Called [facility] back she stated they have been seeing pt for wound care. Pt has an ulcer on his laterla (R) foot its cover with yellow slush. Wanting to see if md can call in some santyl ointment to [facility] to help heal. Inform [facility] will send...Raechel Chute

## 2014-10-22 DIAGNOSIS — I5032 Chronic diastolic (congestive) heart failure: Secondary | ICD-10-CM

## 2014-10-29 ENCOUNTER — Telehealth: Payer: Self-pay | Admitting: Internal Medicine

## 2014-10-29 NOTE — Telephone Encounter (Signed)
[facility] called in and need verbal order, pt had a fall yesterday and skin tear Sherri -[phone]

## 2014-10-29 NOTE — Telephone Encounter (Signed)
Spoke to [employer]. Sherri stated that wound is an abbrassion with skin tear. Verbal orders given to [facility] for wound care/bandage changing.

## 2014-10-30 DIAGNOSIS — I83005 Varicose veins of unspecified lower extremity with ulcer other part of foot: Secondary | ICD-10-CM

## 2014-11-06 ENCOUNTER — Telehealth: Payer: Self-pay

## 2014-11-06 NOTE — Telephone Encounter (Signed)
Pharmacy assistance form completed and mailed to [employer]. Copy mailed to pt and Copy sent to scan.

## 2014-11-11 ENCOUNTER — Telehealth: Payer: Self-pay | Admitting: Internal Medicine

## 2014-11-11 MED ORDER — CLOPIDOGREL BISULFATE 75 MG PO TABS: 75.0000 mg | ORAL_TABLET | Freq: Every day | ORAL | Status: AC

## 2014-11-11 MED ORDER — METOPROLOL SUCCINATE ER 50 MG PO TB24: 50.0000 mg | ORAL_TABLET | Freq: Every day | ORAL | Status: DC

## 2014-11-11 MED ORDER — TAMSULOSIN HCL 0.4 MG PO CAPS: 0.4000 mg | ORAL_CAPSULE | Freq: Every day | ORAL | Status: AC

## 2014-11-11 NOTE — Telephone Encounter (Signed)
erx done

## 2014-11-11 NOTE — Telephone Encounter (Signed)
Needs refills for clopidogrel (PLAVIX) 75 MG tablet [[id number] and metoprolol succinate (TOPROL-XL) 50 MG 24 hr tablet [[id number]] Pharmacy is [facility]. Patient would also like you to call him.

## 2014-11-19 ENCOUNTER — Telehealth: Payer: Self-pay

## 2014-11-19 NOTE — Telephone Encounter (Signed)
Sherrie with [employer]. She wanted to provide [place] with some information regarding pt. She stated that pt had a bad day yesterday (11/17/2014) Sx: SOB and wt loss. She stated that pt had no chest pain or any other sx. Pt had a 5 lb wt drop from last week to early this week (wt last week 235lb and wt today was 240lb) Pt has not had a BM in a few days. Given stool softener to help with constipation. Pt stated that he does not have any chest pain today. No other information to provide.

## 2014-11-26 ENCOUNTER — Telehealth: Payer: Self-pay | Admitting: Internal Medicine

## 2014-11-26 NOTE — Telephone Encounter (Signed)
Would recommend increasing lasix (furosemide) to 1.5 pills daily (total 60 mg daily) for next 3 days then return to 40 mg daily (1 pill daily). If no change in weight with this call back.

## 2014-11-26 NOTE — Telephone Encounter (Signed)
Continue 40 mg BID and please have him schedule follow up with cardiology as he is overdue.

## 2014-11-26 NOTE — Telephone Encounter (Signed)
Patient is actually taking 80mg  lasix daily (one 40mg  tab in am and one 40mg  tab in early afternoon)---please advise, thanks

## 2014-11-26 NOTE — Telephone Encounter (Signed)
She wanted to let you know that he had a 3 1/2 lb weight gain. Shortness of breath is the same. Ankle measurement same.

## 2014-11-26 NOTE — Telephone Encounter (Signed)
Left message with Marchelle Folks advising her of dr Loann Quill note, continue 40mg  lasix bid and make appt with cardiologist

## 2014-12-02 ENCOUNTER — Encounter: Payer: Self-pay | Admitting: Cardiology

## 2014-12-02 NOTE — Progress Notes (Signed)
Patient ID: [name], male   DOB: 10-16-1932, 81 y.o.   MRN: [id number]  Demetrous, Guillette T  Date of visit:  12/02/2014 DOB:  October 01, 1932    Age:  81 yrs. Medical record number:  [id number]     Account number:  [id number] Primary Care Provider: [name] ANN ____________________________ CURRENT DIAGNOSES  1. Cellulitis Of Right Lower Limb  2. Dyspnea  3. Presence of coronary angioplasty implant and graft  4. CAD Native without angina  5. Aortic valve stenosis  6. Chronic diastolic heart failure  7. Type 2 diabetes mellitus with diabetic nephropathy  8. Chronic kidney disease, stage 3 (moderate)  9. Sleep apnea  10. Hypertensive heart disease without heart failure  11. Obesity  12. Hyperlipidemia  13. Restless legs syndrome  14. Idiopathic gout, multiple sites  15. Personal history of transient ischemic attack (TIA), and cerebral infarction without residual deficits  16. Occlusion and stenosis of right carotid artery ____________________________ ALLERGIES  Atorvastatin, Muscle aches  Febuxostat, Rash  Simvastatin, Muscle aches ____________________________ MEDICATIONS  1. Requip 4 mg Tablet, 1 p.o. daily  2. Vitamin D 1,000 unit Capsule, 1 p.o. daily  3. mirtazapine 15 mg Tablet, Rapid Dissolve, 1 p.o. daily  4. lorazepam 1 mg Tablet, 1 p.o. daily  5. Wellbutrin SR 150 mg tablet extended release, 1 p.o. daily  6. allopurinol 300 mg tablet, 1 p.o. daily  7. ferrous gluconate 256 mg (28 mg iron) tablet, 1 p.o. daily  8. magnesium 250 mg tablet, 1 p.o. daily  9. clopidogrel 75 mg tablet, 1 p.o. daily  10. aspirin 81 mg chewable tablet, 1 p.o. daily  11. tamsulosin ER 0.4 mg capsule,extended release 24 hr, 1 p.o. daily  12. ipratropium-albuterol 0.5 mg-3 mg(2.5 mg base)/3 mL nebulization soln, PRN  13. Myrbetriq 50 mg tablet,extended release, 1 p.o. daily  14. Colace 100 mg capsule, BID  15. Lyrica 200 mg capsule, 1 p.o. daily  16. pantoprazole 40 mg tablet,delayed  release, 1 p.o. daily  17. amlodipine 5 mg tablet, BID  18. cephalexin 250 mg tablet, TID  19. furosemide 80 mg tablet, BID ____________________________ CHIEF COMPLAINTS  Followup of Chronic diastolic heart failure ____________________________ HISTORY OF PRESENT ILLNESS Patient returns for cardiac followup. His breathing is better and he has been able to lose about 4 pounds of weight since he was here. The edema has gone down to his legs are erythematous and red and he has several open sores draining on his right lower extremity. Left leg is somewhat crusty and dry. In addition he complains of a sore on the bottom of one of his feet that has been treated by another doctor. He has no chest pain suggestive of angina. He has moderate aortic stenosis and has a harsh murmur. He has known chronic diastolic heart failure. ____________________________ PAST HISTORY  Past Medical Illnesses:  hypertension, hyperlipidemia, obesity, gout, TIA, restless legs, BPH, right brain CVA, osteoarthritis, sleep apnea;  Cardiovascular Illnesses:  CAD, staph pericarditis 11/07 with window, history of  carotid atery disease;  Surgical Procedures:  cataract extraction OU, detached retina, carpal tunnel release, shoulder repair-rt, nasal surg, carotid endarterectomy-left;  NYHA Classification:  II;  Canadian Angina Classification:  Class 0: Asymptomatic;  Cardiology Procedures-Invasive:  pericardial window 06/2006, cardiac cath (left) July 2010, Xience DES stent  circumflex  July 2010  Dr. Excell Seltzer, cardiac cath (left) September 2011;  Cardiology Procedures-Noninvasive:  regadenoson thallium July 2010, echocardiogram May 2013, echocardiogram January 2014, echocardiogram December 2014;  Cardiac Cath Results:  normal Left  main, 20 % stenosis proximal LAD, 40% stenosis mid LAD, 70% stenosis proximal Diag 1, 90% stenosis prox CFX, 90% first OM, small and nondominant RCA;  LVEF of 55% documented via echocardiogram on 05/29/2014,    ____________________________ CARDIO-PULMONARY TEST DATES EKG Date:  08/26/2013;   Cardiac Cath Date:  04/15/2010;  Stent Placement Date: 02/11/2009;  Nuclear Study Date:  02/04/2009;  Echocardiography Date: 05/29/2014;  Chest Xray Date: 07/17/2014;   ____________________________ FAMILY HISTORY Brother -- Brother dead, Carcinoma of the pancreas Father -- Father dead, Coronary Artery Disease, Deceased Mother -- Mother dead, Coronary Artery Disease Sister -- Sister alive and well Sister -- Sister dead, Chronic obstructive lung disease Sister -- Sister dead, Cancer Sister -- Sister alive with problem, Dementia/Alzheimer's ____________________________ SOCIAL HISTORY Alcohol Use:  no alcohol use;  Smoking:  never smoked;  Diet:  regular diet;  Lifestyle:  widower;  Exercise:  exercise is limited due to physical disability;  Occupation:  retired and [employer];  Residence:  lives with daughter;   ____________________________ REVIEW OF SYSTEMS General:  obesity  Integumentary:easy bruisability Eyes: history of retinal detachment, cataract extraction O.U. Respiratory: mild dyspnea with exertion Cardiovascular:  please review HPI Abdominal: denies dyspepsia, GI bleeding, constipation, or diarrhea Genitourinary-Male: nocturia, erectile dysfunction  Musculoskeletal:  arthritis of the shoulder, arthritis of the neck, restless legs, chronic low back pain, sores on legs Neurological:  left arm weakness  ____________________________ PHYSICAL EXAMINATION VITAL SIGNS  Blood Pressure:  108/62 Sitting, Left arm, large cuff  , 112/58 Supine, Left arm and large cuff   Pulse:  78/min. Weight:  245.00 lbs. Height:  70"BMI: 35  Constitutional:  pleasant white male in no acute distress, moderately obese Skin:  scattered hemangiomas, multiple seborrhic keratosis Head:  normocephalic, normal hair pattern, no masses or tenderness ENT:  rosacea, several missing teeth Neck:  supple, no masses,  thyromegaly,  Carotid pulses are full and equal bilaterally without bruits. JVD difficult to assess, healed left carotid endarterectomy scar Chest:  normal symmetry, clear to auscultation. Cardiac:  regular rhythm, normal S1 and S2, no S3 or S4, grade 2/6 systolic murmur left sternal border Peripheral Pulses:  pulses full and equal in all extremities Extremities & Back:  bilateral venous insufficiency changes present, amputation of part of fifth finger, erythem of anterio part of right leg.  crusty dried skin on left leg.  Draining ulcers on anterior right leg Neurological:  weakness left hand ____________________________ MOST RECENT LIPID PANEL 07/15/14  CHOL TOTL 190 mg/dl, LDL 329 NM, HDL 32 mg/dl, TRIGLYCER 518 mg/dl and CHOL/HDL 5.9 (Calc) ____________________________ IMPRESSIONS/PLAN  1. Active cellulitis involving both lower extremities right greater than left with some draining sores 2. Chronic diastolic heart failure with improvement following tiredness with furosemide 3. Type 2 diabetes with diabetic nephropathy and possible leg ulcer 4. Hypertensive heart disease  Recommendations:  Is doing better on higher dose Lasix and recommended BMP. Concerned about his cellulitis and recommended treatment with cephalexin 250 mg 3 times a day. Appointment made at  wound care clinic tomorrow at 8:15 AM. Consider echo after next visit to assess aortic stenosis in light of worsening symptoms. ____________________________ TODAYS ORDERS  1. Basic Metabolic Panel: Today  2. Return Visit: 3 weeks  3. Referral to wound care clinic                       ____________________________ Cardiology Physician:  Darden Palmer MD [facility]

## 2014-12-03 ENCOUNTER — Encounter (HOSPITAL_BASED_OUTPATIENT_CLINIC_OR_DEPARTMENT_OTHER): Payer: Medicare Other | Attending: Internal Medicine

## 2014-12-03 DIAGNOSIS — L97221 Non-pressure chronic ulcer of left calf limited to breakdown of skin: Secondary | ICD-10-CM | POA: Diagnosis not present

## 2014-12-03 DIAGNOSIS — L97511 Non-pressure chronic ulcer of other part of right foot limited to breakdown of skin: Secondary | ICD-10-CM | POA: Diagnosis not present

## 2014-12-03 DIAGNOSIS — E1142 Type 2 diabetes mellitus with diabetic polyneuropathy: Secondary | ICD-10-CM | POA: Diagnosis not present

## 2014-12-03 DIAGNOSIS — I872 Venous insufficiency (chronic) (peripheral): Secondary | ICD-10-CM | POA: Diagnosis not present

## 2014-12-03 DIAGNOSIS — I10 Essential (primary) hypertension: Secondary | ICD-10-CM | POA: Insufficient documentation

## 2014-12-03 DIAGNOSIS — L97211 Non-pressure chronic ulcer of right calf limited to breakdown of skin: Secondary | ICD-10-CM | POA: Diagnosis not present

## 2014-12-03 DIAGNOSIS — Z8639 Personal history of other endocrine, nutritional and metabolic disease: Secondary | ICD-10-CM | POA: Insufficient documentation

## 2014-12-03 DIAGNOSIS — I2510 Atherosclerotic heart disease of native coronary artery without angina pectoris: Secondary | ICD-10-CM | POA: Diagnosis not present

## 2014-12-03 DIAGNOSIS — I252 Old myocardial infarction: Secondary | ICD-10-CM | POA: Insufficient documentation

## 2014-12-03 DIAGNOSIS — L97811 Non-pressure chronic ulcer of other part of right lower leg limited to breakdown of skin: Secondary | ICD-10-CM | POA: Diagnosis present

## 2014-12-03 DIAGNOSIS — E11621 Type 2 diabetes mellitus with foot ulcer: Secondary | ICD-10-CM | POA: Diagnosis not present

## 2014-12-03 DIAGNOSIS — I509 Heart failure, unspecified: Secondary | ICD-10-CM | POA: Insufficient documentation

## 2014-12-03 DIAGNOSIS — G4730 Sleep apnea, unspecified: Secondary | ICD-10-CM | POA: Diagnosis not present

## 2014-12-03 LAB — GLUCOSE, CAPILLARY: Glucose-Capillary: 95 mg/dL (ref 65–99)

## 2014-12-04 ENCOUNTER — Ambulatory Visit (HOSPITAL_COMMUNITY)
Admission: RE | Admit: 2014-12-04 | Discharge: 2014-12-04 | Disposition: A | Discharge status: Home / Self Care | Payer: Medicare Other | Source: Ambulatory Visit | Attending: Internal Medicine | Admitting: Internal Medicine

## 2014-12-04 ENCOUNTER — Other Ambulatory Visit: Payer: Self-pay | Admitting: Internal Medicine

## 2014-12-04 ENCOUNTER — Telehealth: Payer: Self-pay | Admitting: Internal Medicine

## 2014-12-04 DIAGNOSIS — M869 Osteomyelitis, unspecified: Secondary | ICD-10-CM

## 2014-12-04 DIAGNOSIS — L97519 Non-pressure chronic ulcer of other part of right foot with unspecified severity: Secondary | ICD-10-CM | POA: Insufficient documentation

## 2014-12-04 NOTE — Telephone Encounter (Signed)
Verbal orders given for OKAY to take verbal orders from [facility].

## 2014-12-04 NOTE — Telephone Encounter (Signed)
[name] [phone]  Verbal orders Needs orders that it is ok to take orders from wound care center [facility]?

## 2014-12-04 NOTE — Telephone Encounter (Signed)
sherri from [facility] [phone]. Called again regarding encounter below. Orders from wound care were to start on Monday and was hoping to get the verbal ok this evening.

## 2014-12-07 NOTE — Telephone Encounter (Signed)
Agree as documented thanks

## 2014-12-09 ENCOUNTER — Other Ambulatory Visit: Payer: Self-pay | Admitting: Internal Medicine

## 2014-12-09 ENCOUNTER — Ambulatory Visit (HOSPITAL_COMMUNITY)
Admission: RE | Admit: 2014-12-09 | Discharge: 2014-12-09 | Disposition: A | Discharge status: Home / Self Care | Payer: Medicare Other | Source: Ambulatory Visit | Attending: Vascular Surgery | Admitting: Vascular Surgery

## 2014-12-09 DIAGNOSIS — L97519 Non-pressure chronic ulcer of other part of right foot with unspecified severity: Secondary | ICD-10-CM | POA: Diagnosis not present

## 2014-12-09 DIAGNOSIS — I739 Peripheral vascular disease, unspecified: Secondary | ICD-10-CM

## 2014-12-09 DIAGNOSIS — I872 Venous insufficiency (chronic) (peripheral): Secondary | ICD-10-CM | POA: Diagnosis not present

## 2014-12-10 DIAGNOSIS — E11621 Type 2 diabetes mellitus with foot ulcer: Secondary | ICD-10-CM | POA: Diagnosis not present

## 2014-12-10 DIAGNOSIS — L97211 Non-pressure chronic ulcer of right calf limited to breakdown of skin: Secondary | ICD-10-CM | POA: Diagnosis not present

## 2014-12-10 DIAGNOSIS — L97511 Non-pressure chronic ulcer of other part of right foot limited to breakdown of skin: Secondary | ICD-10-CM | POA: Diagnosis not present

## 2014-12-10 DIAGNOSIS — L97221 Non-pressure chronic ulcer of left calf limited to breakdown of skin: Secondary | ICD-10-CM | POA: Diagnosis not present

## 2014-12-11 ENCOUNTER — Telehealth: Payer: Self-pay | Admitting: Internal Medicine

## 2014-12-11 NOTE — Telephone Encounter (Signed)
Called Meredith back and gave verbal okay for CardioPul and wound care 2xweek for 7 weeks. Sharyl Nimrod also stated that pt is going to the [facility] on Fridays and adds that [facility] started pt on a abx due to wound becoming infected.

## 2014-12-11 NOTE — Telephone Encounter (Signed)
[name]   [phone]  [place]  Need verbal orders   Sign for Cardio plumonary assessment and for wound care 2 week 7

## 2014-12-12 NOTE — Telephone Encounter (Signed)
Concerns noted Agree with mgmt as ongoing and VO as provided thanks

## 2014-12-14 ENCOUNTER — Encounter: Payer: Self-pay | Admitting: Vascular Surgery

## 2014-12-15 ENCOUNTER — Telehealth: Payer: Self-pay

## 2014-12-15 ENCOUNTER — Other Ambulatory Visit: Payer: Self-pay

## 2014-12-15 ENCOUNTER — Encounter: Payer: Self-pay | Admitting: Vascular Surgery

## 2014-12-15 ENCOUNTER — Other Ambulatory Visit: Payer: Self-pay | Admitting: *Deleted

## 2014-12-15 DIAGNOSIS — I739 Peripheral vascular disease, unspecified: Secondary | ICD-10-CM

## 2014-12-15 MED ORDER — PREGABALIN 200 MG PO CAPS: 200.0000 mg | ORAL_CAPSULE | Freq: Every day | ORAL | Status: AC

## 2014-12-15 NOTE — Telephone Encounter (Signed)
Note on my desk to put pfizer app into pt chart.   LVM for pt to call back regarding above. This has been added to pt chart.   RE: want to make sure that there is no info missing that would prevent pt from having rx assistance.

## 2014-12-16 ENCOUNTER — Inpatient Hospital Stay (HOSPITAL_COMMUNITY): Admission: RE | Admit: 2014-12-16 | Payer: Medicare Other | Source: Ambulatory Visit

## 2014-12-16 ENCOUNTER — Ambulatory Visit: Payer: Medicare Other | Admitting: Vascular Surgery

## 2014-12-18 ENCOUNTER — Encounter: Payer: Self-pay | Admitting: Vascular Surgery

## 2014-12-18 DIAGNOSIS — L97221 Non-pressure chronic ulcer of left calf limited to breakdown of skin: Secondary | ICD-10-CM | POA: Diagnosis not present

## 2014-12-18 DIAGNOSIS — E11621 Type 2 diabetes mellitus with foot ulcer: Secondary | ICD-10-CM | POA: Diagnosis not present

## 2014-12-18 DIAGNOSIS — L97211 Non-pressure chronic ulcer of right calf limited to breakdown of skin: Secondary | ICD-10-CM | POA: Diagnosis not present

## 2014-12-18 DIAGNOSIS — L97511 Non-pressure chronic ulcer of other part of right foot limited to breakdown of skin: Secondary | ICD-10-CM | POA: Diagnosis not present

## 2014-12-22 ENCOUNTER — Ambulatory Visit (HOSPITAL_COMMUNITY)
Admission: RE | Admit: 2014-12-22 | Discharge: 2014-12-22 | Disposition: A | Discharge status: Home / Self Care | Payer: Medicare Other | Source: Ambulatory Visit | Attending: Vascular Surgery | Admitting: Vascular Surgery

## 2014-12-22 DIAGNOSIS — I739 Peripheral vascular disease, unspecified: Secondary | ICD-10-CM | POA: Diagnosis present

## 2014-12-23 ENCOUNTER — Encounter: Payer: Self-pay | Admitting: Vascular Surgery

## 2014-12-23 ENCOUNTER — Ambulatory Visit (INDEPENDENT_AMBULATORY_CARE_PROVIDER_SITE_OTHER): Payer: Medicare Other | Admitting: Vascular Surgery

## 2014-12-23 VITALS — BP 132/56 | HR 65 | Ht 69.0 in | Wt 247.9 lb

## 2014-12-23 DIAGNOSIS — I6523 Occlusion and stenosis of bilateral carotid arteries: Secondary | ICD-10-CM | POA: Diagnosis not present

## 2014-12-23 DIAGNOSIS — J189 Pneumonia, unspecified organism: Secondary | ICD-10-CM

## 2014-12-23 DIAGNOSIS — I70299 Other atherosclerosis of native arteries of extremities, unspecified extremity: Secondary | ICD-10-CM

## 2014-12-23 DIAGNOSIS — L97909 Non-pressure chronic ulcer of unspecified part of unspecified lower leg with unspecified severity: Secondary | ICD-10-CM | POA: Diagnosis not present

## 2014-12-23 HISTORY — DX: Pneumonia, unspecified organism: J18.9

## 2014-12-23 NOTE — Progress Notes (Signed)
[facility] [place]  Patient name: [name] MRN: [id number] DOB: Jun 05, 1933 Sex: male  REASON FOR VISIT: Nonhealing wound of the right foot  HPI: [name] is a 81 y.o. male who underwent a left carotid endarterectomy in 2006 and was last seen in our office on 08/18/2014 by our nurse practitioner. At the time of the last visit, the carotid endarterectomy site was widely patent without evidence of restenosis. On the right side was a less than 80% stenosis. The patient was scheduled for a follow up visit in 1 year. At that time, the patient was also noted to have chronic venous insufficiency.  Since the last visit the patient has developed a wound on the lateral aspect of the right fifth metatarsal. He was sent back for vascular evaluation. I believe that he was referred by Dr. Selena Batten.  Past Medical History  Diagnosis Date  . Gout, unspecified     severe dz, "treatment done that last 15 years"  . Obesity (BMI 30-39.9)   . Restless leg syndrome   . History of retinal detachment 1994  . Hypertensive heart disease   . CAD (coronary artery disease), native coronary artery   . History of pericarditis 2007    MSSA, s/p pericardial window  . GERD   . Osteoarthritis   . Sleep apnea in adult     CPAP qhs  . TIA (transient ischemic attack) 2005  . BPH (benign prostatic hypertrophy)     "minor"  . Lumbar spinal stenosis   . Carotid artery occlusion   . Unspecified venous (peripheral) insufficiency   . Stroke May 2003  . Anxiety   . Depression   . Aortic stenosis     ECHO 05/29/14 moderate AS Mean gradient 21 mm EF 55% ECHO 07/09/13  Mean gradient 12 mm Mild to moderate AS EF 50%    . CAD (coronary artery disease)     02/11/09  Xience stent to circumflex 2.5 x 18 mm postdilated to 2.75 mm  Cath sept 2011  normal Left main, 20 % stenosis proximal LAD, 40% stenosis mid LAD, 70% stenosis proximal Diag 1, 90% stenosis prox CFX, 90% first OM, small and nondominant  RCA;   . Carotid artery disease     Prior TIA 2006 with left CEA by Dr. Arbie Cookey Recurrent TIA in April 2011   . Type 2 diabetes mellitus with vascular disease   . Lumbar disc disease   . CKD (chronic kidney disease), stage III 02/08/2014   Family History  Problem Relation Age of Onset  . Heart disease Mother     Before age 60  . Heart attack Mother   . Diabetes Father   . Heart disease Father   . Breast cancer Sister   . Cancer Sister   . Prostate cancer Brother   . Lung cancer Brother   . Cancer Brother   . Diabetes Daughter   . Diabetes Son   . Hypertension Son    SOCIAL HISTORY: History  Substance Use Topics  . Smoking status: Never Smoker   . Smokeless tobacco: Never Used  . Alcohol Use: No   Allergies  Allergen Reactions  . Statins Other (See Comments)    Severe leg myalgias, weakness   Current Outpatient Prescriptions  Medication Sig Dispense Refill  . acetaminophen (TYLENOL) 325 MG tablet Take 2 tablets (650 mg total) by mouth every 6 (six) hours as needed for mild pain or moderate pain.    Marland Kitchen allopurinol (ZYLOPRIM)  300 MG tablet Take 1 tablet (300 mg total) by mouth every morning. 90 tablet 1  . aspirin EC 81 MG tablet Take 81 mg by mouth daily.    . bisacodyl (DULCOLAX) 10 MG suppository Place 1 suppository (10 mg total) rectally daily as needed for moderate constipation. 12 suppository 0  . buPROPion (WELLBUTRIN XL) 150 MG 24 hr tablet Take 1 tablet (150 mg total) by mouth every morning. 90 tablet 1  . cholecalciferol (VITAMIN D) 1000 UNITS tablet Take 1,000 Units by mouth daily.    . clopidogrel (PLAVIX) 75 MG tablet Take 1 tablet (75 mg total) by mouth daily. 90 tablet 3  . docusate sodium 100 MG CAPS Take 100 mg by mouth 2 (two) times daily. 10 capsule 0  . Ferrous Gluconate 256 (28 FE) MG TABS Take 256 mg by mouth daily.    . furosemide (LASIX) 40 MG tablet Take 1 tablet (40 mg total) by mouth daily.    Marland Kitchen LORazepam (ATIVAN) 1 MG tablet Take 1 tablet (1 mg  total) by mouth every 8 (eight) hours as needed for anxiety or sleep. 90 tablet 3  . Magnesium 250 MG TABS Take 250 mg by mouth daily.     . metoprolol succinate (TOPROL-XL) 50 MG 24 hr tablet Take 1 tablet (50 mg total) by mouth daily. Take with or immediately following a meal. 90 tablet 3  . mirabegron ER (MYRBETRIQ) 50 MG TB24 tablet Take 1 tablet (50 mg total) by mouth daily. 90 tablet 1  . mirtazapine (REMERON) 15 MG tablet Take 1 tablet (15 mg total) by mouth at bedtime. 30 tablet 11  . pantoprazole (PROTONIX) 40 MG tablet Take 1 tablet (40 mg total) by mouth 2 (two) times daily. 90 tablet 1  . pregabalin (LYRICA) 200 MG capsule Take 1 capsule (200 mg total) by mouth at bedtime. 90 capsule 1  . rOPINIRole (REQUIP) 4 MG tablet Take 1 tablet (4 mg total) by mouth at bedtime. 90 tablet 1  . tamsulosin (FLOMAX) 0.4 MG CAPS capsule Take 1 capsule (0.4 mg total) by mouth daily. 90 capsule 3  . collagenase (SANTYL) ointment Apply 1 application topically 4 (four) times daily. (Patient not taking: Reported on 12/23/2014) 30 g 0   No current facility-administered medications for this visit.   REVIEW OF SYSTEMS: Arly.Keller ] denotes positive finding; [  ] denotes negative finding  CARDIOVASCULAR:   chest pain    chest pressure    palpitations   Arly.Keller ] orthopnea   Arly.Keller ] dyspnea on exertion    claudication    rest pain    DVT    phlebitis PULMONARY:    productive cough    asthma    wheezing NEUROLOGIC:    weakness   paresthesias   aphasia   amaurosis   dizziness HEMATOLOGIC:    bleeding problems    clotting disorders MUSCULOSKELETAL:   joint pain    joint swelling  leg swelling GASTROINTESTINAL:   blood in stool    hematemesis GENITOURINARY:    dysuria    hematuria PSYCHIATRIC:   history of major depression INTEGUMENTARY:   rashes   ulcers CONSTITUTIONAL:   fever    chills  PHYSICAL EXAM: Filed Vitals:   12/23/14 0934    BP: 132/56  Pulse: 65  Height:  (1.753  m)  Weight: 247 lb 14.4 oz (112.447 kg)  SpO2: 92%   GENERAL: The patient is a well-nourished male, in no acute distress. The vital signs are documented above. CARDIOVASCULAR: There is a regular rate and rhythm. He has a loud systolic ejection murmur. He has bilateral carotid bruits. He has palpable femoral and popliteal pulses bilaterally. There is a palpable left dorsalis pedis pulse. The right dorsalis pedis pulse is diminished. He has chronic venous insufficiency bilaterally. PULMONARY: There is good air exchange bilaterally without wheezing or rales. ABDOMEN: Soft and non-tender with normal pitched bowel sounds.  MUSCULOSKELETAL: There are no major deformities or cyanosis. NEUROLOGIC: No focal weakness or paresthesias are detected. SKIN: there is a wound on the tip of the right great toe and also on the lateral aspect of the fifth metatarsal on the right foot. He has a superficial wound on the anterior aspect of his left leg. He has significant chronic venous insufficiency bilaterally with hyperpigmentation. PSYCHIATRIC: The patient has a normal affect.  DATA:  I have independently interpreted duplex of the left lower extremity today which shows triphasic signals through the popliteal artery with monophasic signals in the posterior tibial and anterior tibial arteries. Peroneal artery has a triphasic signal.  I have reviewed the arterial Doppler study from 12/09/2014 which showed a biphasic posterior tibial signal bilaterally with monophasic anterior tibial signals. ABI on the right was 93% on the left 74%. These may be falsely elevated because of calcific disease. Toe pressure on the right was 73 mmHg. Toe pressure on the left could not be obtained.  MEDICAL ISSUES:  NONHEALING WOUNDS OF THE RIGHT FOOT: This patient has a nonhealing wound on the lateral aspect of the right fifth metatarsal and also at the tip of the right great toe. He has  evidence of some tibial artery occlusive disease and for this reason I have recommended arteriography in order to assess his circulation and chance for healing. If there is disease amenable to angioplasty this contention be addressed at the same time. He would not be a good candidate for bypass given his age, significant cardiac disease including a history of aortic stenosis. He is scheduled to see Dr. Donnie Aho for an ECHO soon. I'll proceed with arteriography. If this shows adequate circulation then he should be considered for right 5th toe amputation under local anesthesia, if his cardiac workup is complete.  I have reviewed with the patient the indications for arteriography. In addition, I have reviewed the potential complications of arteriography including but not limited to: Bleeding, arterial injury, arterial thrombosis, dye action, renal insufficiency, or other unpredictable medical problems. I have explained to the patient that if we find disease amenable to angioplasty we could potentially address this at the same time. I have discussed the potential complications of angioplasty and stenting, including but not limited to: Bleeding, arterial thrombosis, arterial injury, dissection, or the need for surgical intervention.  This procedure is tentatively scheduled for 01/04/2015.   Waverly Ferrari [facility] [place] Beeper: [phone]

## 2014-12-24 ENCOUNTER — Other Ambulatory Visit: Payer: Self-pay

## 2014-12-25 ENCOUNTER — Encounter (HOSPITAL_BASED_OUTPATIENT_CLINIC_OR_DEPARTMENT_OTHER): Payer: Medicare Other | Attending: Internal Medicine

## 2014-12-25 DIAGNOSIS — L97511 Non-pressure chronic ulcer of other part of right foot limited to breakdown of skin: Secondary | ICD-10-CM | POA: Insufficient documentation

## 2014-12-25 DIAGNOSIS — E11622 Type 2 diabetes mellitus with other skin ulcer: Secondary | ICD-10-CM | POA: Diagnosis not present

## 2014-12-25 DIAGNOSIS — E11621 Type 2 diabetes mellitus with foot ulcer: Secondary | ICD-10-CM | POA: Insufficient documentation

## 2014-12-25 DIAGNOSIS — L97821 Non-pressure chronic ulcer of other part of left lower leg limited to breakdown of skin: Secondary | ICD-10-CM | POA: Insufficient documentation

## 2014-12-25 DIAGNOSIS — E1151 Type 2 diabetes mellitus with diabetic peripheral angiopathy without gangrene: Secondary | ICD-10-CM | POA: Diagnosis not present

## 2014-12-25 DIAGNOSIS — E1140 Type 2 diabetes mellitus with diabetic neuropathy, unspecified: Secondary | ICD-10-CM | POA: Insufficient documentation

## 2015-01-01 DIAGNOSIS — E11622 Type 2 diabetes mellitus with other skin ulcer: Secondary | ICD-10-CM | POA: Diagnosis not present

## 2015-01-01 DIAGNOSIS — L97511 Non-pressure chronic ulcer of other part of right foot limited to breakdown of skin: Secondary | ICD-10-CM | POA: Diagnosis not present

## 2015-01-01 DIAGNOSIS — L97821 Non-pressure chronic ulcer of other part of left lower leg limited to breakdown of skin: Secondary | ICD-10-CM | POA: Diagnosis not present

## 2015-01-01 DIAGNOSIS — E11621 Type 2 diabetes mellitus with foot ulcer: Secondary | ICD-10-CM | POA: Diagnosis not present

## 2015-01-07 ENCOUNTER — Other Ambulatory Visit: Payer: Self-pay | Admitting: Internal Medicine

## 2015-01-07 DIAGNOSIS — L97511 Non-pressure chronic ulcer of other part of right foot limited to breakdown of skin: Secondary | ICD-10-CM | POA: Diagnosis not present

## 2015-01-07 DIAGNOSIS — E11621 Type 2 diabetes mellitus with foot ulcer: Secondary | ICD-10-CM | POA: Diagnosis not present

## 2015-01-07 DIAGNOSIS — E11622 Type 2 diabetes mellitus with other skin ulcer: Secondary | ICD-10-CM | POA: Diagnosis not present

## 2015-01-07 DIAGNOSIS — L97821 Non-pressure chronic ulcer of other part of left lower leg limited to breakdown of skin: Secondary | ICD-10-CM | POA: Diagnosis not present

## 2015-01-07 DIAGNOSIS — T148XXA Other injury of unspecified body region, initial encounter: Secondary | ICD-10-CM

## 2015-01-08 ENCOUNTER — Encounter (HOSPITAL_COMMUNITY)
Admission: RE | Disposition: A | Discharge status: Home / Self Care | Payer: Self-pay | Source: Ambulatory Visit | Attending: Vascular Surgery

## 2015-01-08 ENCOUNTER — Ambulatory Visit (HOSPITAL_COMMUNITY)
Admission: RE | Admit: 2015-01-08 | Discharge: 2015-01-08 | Disposition: A | Discharge status: Home / Self Care | Payer: Medicare Other | Source: Ambulatory Visit | Attending: Vascular Surgery | Admitting: Vascular Surgery

## 2015-01-08 DIAGNOSIS — J9621 Acute and chronic respiratory failure with hypoxia: Secondary | ICD-10-CM | POA: Diagnosis not present

## 2015-01-08 DIAGNOSIS — Z8673 Personal history of transient ischemic attack (TIA), and cerebral infarction without residual deficits: Secondary | ICD-10-CM | POA: Insufficient documentation

## 2015-01-08 DIAGNOSIS — L97919 Non-pressure chronic ulcer of unspecified part of right lower leg with unspecified severity: Secondary | ICD-10-CM | POA: Insufficient documentation

## 2015-01-08 DIAGNOSIS — E119 Type 2 diabetes mellitus without complications: Secondary | ICD-10-CM

## 2015-01-08 DIAGNOSIS — I2510 Atherosclerotic heart disease of native coronary artery without angina pectoris: Secondary | ICD-10-CM | POA: Insufficient documentation

## 2015-01-08 DIAGNOSIS — I70235 Atherosclerosis of native arteries of right leg with ulceration of other part of foot: Secondary | ICD-10-CM | POA: Diagnosis not present

## 2015-01-08 DIAGNOSIS — I70239 Atherosclerosis of native arteries of right leg with ulceration of unspecified site: Secondary | ICD-10-CM | POA: Insufficient documentation

## 2015-01-08 DIAGNOSIS — N183 Chronic kidney disease, stage 3 (moderate): Secondary | ICD-10-CM | POA: Insufficient documentation

## 2015-01-08 DIAGNOSIS — I83009 Varicose veins of unspecified lower extremity with ulcer of unspecified site: Secondary | ICD-10-CM | POA: Diagnosis present

## 2015-01-08 DIAGNOSIS — R0602 Shortness of breath: Secondary | ICD-10-CM | POA: Diagnosis not present

## 2015-01-08 DIAGNOSIS — L97909 Non-pressure chronic ulcer of unspecified part of unspecified lower leg with unspecified severity: Secondary | ICD-10-CM

## 2015-01-08 HISTORY — PX: PERIPHERAL VASCULAR CATHETERIZATION: SHX172C

## 2015-01-08 LAB — POCT I-STAT, CHEM 8
BUN: 39 mg/dL — ABNORMAL HIGH (ref 6–20)
CREATININE: 1.7 mg/dL — AB (ref 0.61–1.24)
Calcium, Ion: 1.16 mmol/L (ref 1.13–1.30)
Chloride: 100 mmol/L — ABNORMAL LOW (ref 101–111)
Glucose, Bld: 107 mg/dL — ABNORMAL HIGH (ref 65–99)
HEMATOCRIT: 50 % (ref 39.0–52.0)
Hemoglobin: 17 g/dL (ref 13.0–17.0)
POTASSIUM: 4.1 mmol/L (ref 3.5–5.1)
SODIUM: 141 mmol/L (ref 135–145)
TCO2: 30 mmol/L (ref 0–100)

## 2015-01-08 LAB — GLUCOSE, CAPILLARY: Glucose-Capillary: 96 mg/dL (ref 65–99)

## 2015-01-08 SURGERY — ABDOMINAL AORTOGRAM
Anesthesia: LOCAL

## 2015-01-08 MED ORDER — OXYCODONE-ACETAMINOPHEN 5-325 MG PO TABS
ORAL_TABLET | ORAL | Status: AC
  Filled 2015-01-08: qty 2

## 2015-01-08 MED ORDER — LIDOCAINE HCL (PF) 1 % IJ SOLN
INTRAMUSCULAR | Status: AC
  Filled 2015-01-08: qty 30

## 2015-01-08 MED ORDER — MIDAZOLAM HCL 2 MG/2ML IJ SOLN
INTRAMUSCULAR | Status: DC | PRN
  Administered 2015-01-08: 1 mg via INTRAVENOUS

## 2015-01-08 MED ORDER — FENTANYL CITRATE (PF) 100 MCG/2ML IJ SOLN
INTRAMUSCULAR | Status: AC
  Filled 2015-01-08: qty 2

## 2015-01-08 MED ORDER — IODIXANOL 320 MG/ML IV SOLN
INTRAVENOUS | Status: DC | PRN
  Administered 2015-01-08: 30 mL via INTRAVENOUS

## 2015-01-08 MED ORDER — FENTANYL CITRATE (PF) 100 MCG/2ML IJ SOLN
INTRAMUSCULAR | Status: DC | PRN
  Administered 2015-01-08: 50 ug via INTRAVENOUS

## 2015-01-08 MED ORDER — SODIUM CHLORIDE 0.9 % IV SOLN: 1.0000 mL/kg/h | INTRAVENOUS | Status: DC

## 2015-01-08 MED ORDER — SODIUM CHLORIDE 0.9 % IV SOLN
INTRAVENOUS | Status: DC
  Administered 2015-01-08: 06:00:00 via INTRAVENOUS

## 2015-01-08 MED ORDER — MIDAZOLAM HCL 2 MG/2ML IJ SOLN
INTRAMUSCULAR | Status: AC
  Filled 2015-01-08: qty 2

## 2015-01-08 MED ORDER — OXYCODONE-ACETAMINOPHEN 5-325 MG PO TABS
1.0000 | ORAL_TABLET | ORAL | Status: DC | PRN
  Administered 2015-01-08: 2 via ORAL

## 2015-01-08 MED ORDER — HEPARIN (PORCINE) IN NACL 2-0.9 UNIT/ML-% IJ SOLN
INTRAMUSCULAR | Status: AC
  Filled 2015-01-08: qty 1000

## 2015-01-08 SURGICAL SUPPLY — 15 items
CATH ANGIO 5F PIGTAIL 65CM (CATHETERS) ×1 IMPLANT
CATH CROSS OVER TEMPO 5F (CATHETERS) ×1 IMPLANT
CATH STRAIGHT 5FR 65CM (CATHETERS) ×1 IMPLANT
COVER PRB 48X5XTLSCP FOLD TPE (BAG) IMPLANT
COVER PROBE 5X48 (BAG) ×2
FILTER CO2 0.2 MICRON (VASCULAR PRODUCTS) ×1 IMPLANT
KIT MICROINTRODUCER STIFF 5F (SHEATH) ×1 IMPLANT
KIT PV (KITS) ×2 IMPLANT
RESERVOIR CO2 (VASCULAR PRODUCTS) ×1 IMPLANT
SET FLUSH CO2 (MISCELLANEOUS) ×1 IMPLANT
SHEATH PINNACLE 5F 10CM (SHEATH) ×1 IMPLANT
SYR MEDRAD MARK V 150ML (SYRINGE) ×1 IMPLANT
TRANSDUCER W/STOPCOCK (MISCELLANEOUS) ×2 IMPLANT
TRAY PV CATH (CUSTOM PROCEDURE TRAY) ×2 IMPLANT
WIRE HITORQ VERSACORE ST 145CM (WIRE) ×1 IMPLANT

## 2015-01-08 NOTE — Discharge Instructions (Signed)

## 2015-01-08 NOTE — H&P (View-Only) (Signed)
 [facility] [place]  Patient name: [name] MRN: [id number] DOB: 10/26/1932 Sex: male  REASON FOR VISIT: Nonhealing wound of the right foot  HPI: [name] is a 81 y.o. male who underwent a left carotid endarterectomy in 2006 and was last seen in our office on 08/18/2014 by our nurse practitioner. At the time of the last visit, the carotid endarterectomy site was widely patent without evidence of restenosis. On the right side was a less than 80% stenosis. The patient was scheduled for a follow up visit in 1 year. At that time, the patient was also noted to have chronic venous insufficiency.  Since the last visit the patient has developed a wound on the lateral aspect of the right fifth metatarsal. He was sent back for vascular evaluation. I believe that he was referred by Dr. Cody.  Past Medical History  Diagnosis Date  . Gout, unspecified     severe dz, "treatment done that last 15 years"  . Obesity (BMI 30-39.9)   . Restless leg syndrome   . History of retinal detachment 1994  . Hypertensive heart disease   . CAD (coronary artery disease), native coronary artery   . History of pericarditis 2007    MSSA, s/p pericardial window  . GERD   . Osteoarthritis   . Sleep apnea in adult     CPAP qhs  . TIA (transient ischemic attack) 2005  . BPH (benign prostatic hypertrophy)     "minor"  . Lumbar spinal stenosis   . Carotid artery occlusion   . Unspecified venous (peripheral) insufficiency   . Stroke May 2003  . Anxiety   . Depression   . Aortic stenosis     ECHO 05/29/14 moderate AS Mean gradient 21 mm EF 55% ECHO 07/09/13  Mean gradient 12 mm Mild to moderate AS EF 50%    . CAD (coronary artery disease)     02/11/09  Xience stent to circumflex 2.5 x 18 mm postdilated to 2.75 mm  Cath sept 2011  normal Left main, 20 % stenosis proximal LAD, 40% stenosis mid LAD, 70% stenosis proximal Diag 1, 90% stenosis prox CFX, 90% first OM, small and nondominant  RCA;   . Carotid artery disease     Prior TIA 2006 with left CEA by Dr. Early Recurrent TIA in April 2011   . Type 2 diabetes mellitus with vascular disease   . Lumbar disc disease   . CKD (chronic kidney disease), stage III 02/08/2014   Family History  Problem Relation Age of Onset  . Heart disease Mother     Before age 60  . Heart attack Mother   . Diabetes Father   . Heart disease Father   . Breast cancer Sister   . Cancer Sister   . Prostate cancer Brother   . Lung cancer Brother   . Cancer Brother   . Diabetes Daughter   . Diabetes Son   . Hypertension Son    SOCIAL HISTORY: History  Substance Use Topics  . Smoking status: Never Smoker   . Smokeless tobacco: Never Used  . Alcohol Use: No   Allergies  Allergen Reactions  . Statins Other (See Comments)    Severe leg myalgias, weakness   Current Outpatient Prescriptions  Medication Sig Dispense Refill  . acetaminophen (TYLENOL) 325 MG tablet Take 2 tablets (650 mg total) by mouth every 6 (six) hours as needed for mild pain or moderate pain.    . allopurinol (ZYLOPRIM)   300 MG tablet Take 1 tablet (300 mg total) by mouth every morning. 90 tablet 1  . aspirin EC 81 MG tablet Take 81 mg by mouth daily.    . bisacodyl (DULCOLAX) 10 MG suppository Place 1 suppository (10 mg total) rectally daily as needed for moderate constipation. 12 suppository 0  . buPROPion (WELLBUTRIN XL) 150 MG 24 hr tablet Take 1 tablet (150 mg total) by mouth every morning. 90 tablet 1  . cholecalciferol (VITAMIN D) 1000 UNITS tablet Take 1,000 Units by mouth daily.    . clopidogrel (PLAVIX) 75 MG tablet Take 1 tablet (75 mg total) by mouth daily. 90 tablet 3  . docusate sodium 100 MG CAPS Take 100 mg by mouth 2 (two) times daily. 10 capsule 0  . Ferrous Gluconate 256 (28 FE) MG TABS Take 256 mg by mouth daily.    . furosemide (LASIX) 40 MG tablet Take 1 tablet (40 mg total) by mouth daily.    . LORazepam (ATIVAN) 1 MG tablet Take 1 tablet (1 mg  total) by mouth every 8 (eight) hours as needed for anxiety or sleep. 90 tablet 3  . Magnesium 250 MG TABS Take 250 mg by mouth daily.     . metoprolol succinate (TOPROL-XL) 50 MG 24 hr tablet Take 1 tablet (50 mg total) by mouth daily. Take with or immediately following a meal. 90 tablet 3  . mirabegron ER (MYRBETRIQ) 50 MG TB24 tablet Take 1 tablet (50 mg total) by mouth daily. 90 tablet 1  . mirtazapine (REMERON) 15 MG tablet Take 1 tablet (15 mg total) by mouth at bedtime. 30 tablet 11  . pantoprazole (PROTONIX) 40 MG tablet Take 1 tablet (40 mg total) by mouth 2 (two) times daily. 90 tablet 1  . pregabalin (LYRICA) 200 MG capsule Take 1 capsule (200 mg total) by mouth at bedtime. 90 capsule 1  . rOPINIRole (REQUIP) 4 MG tablet Take 1 tablet (4 mg total) by mouth at bedtime. 90 tablet 1  . tamsulosin (FLOMAX) 0.4 MG CAPS capsule Take 1 capsule (0.4 mg total) by mouth daily. 90 capsule 3  . collagenase (SANTYL) ointment Apply 1 application topically 4 (four) times daily. (Patient not taking: Reported on 12/23/2014) 30 g 0   No current facility-administered medications for this visit.   REVIEW OF SYSTEMS: [X ] denotes positive finding; [  ] denotes negative finding  CARDIOVASCULAR:  [ ] chest pain   [ ] chest pressure   [ ] palpitations   [X ] orthopnea   [X ] dyspnea on exertion   [ ] claudication   [ ] rest pain   [ ] DVT   [ ] phlebitis PULMONARY:   [ ] productive cough   [ ] asthma   [ ] wheezing NEUROLOGIC:   [ ] weakness  [ ] paresthesias  [ ] aphasia  [ ] amaurosis  [ ] dizziness HEMATOLOGIC:   [ ] bleeding problems   [ ] clotting disorders MUSCULOSKELETAL:  [ ] joint pain   [ ] joint swelling [ ] leg swelling GASTROINTESTINAL: [ ]  blood in stool  [ ]  hematemesis GENITOURINARY:  [ ]  dysuria  [ ]  hematuria PSYCHIATRIC:  [ ] history of major depression INTEGUMENTARY:  [ ] rashes  [ ] ulcers CONSTITUTIONAL:  [ ] fever   [ ] chills  PHYSICAL EXAM: Filed Vitals:   12/23/14 0934    BP: 132/56  Pulse: 65  Height: 5' 9" (1.753   m)  Weight: 247 lb 14.4 oz (112.447 kg)  SpO2: 92%   GENERAL: The patient is a well-nourished male, in no acute distress. The vital signs are documented above. CARDIOVASCULAR: There is a regular rate and rhythm. He has a loud systolic ejection murmur. He has bilateral carotid bruits. He has palpable femoral and popliteal pulses bilaterally. There is a palpable left dorsalis pedis pulse. The right dorsalis pedis pulse is diminished. He has chronic venous insufficiency bilaterally. PULMONARY: There is good air exchange bilaterally without wheezing or rales. ABDOMEN: Soft and non-tender with normal pitched bowel sounds.  MUSCULOSKELETAL: There are no major deformities or cyanosis. NEUROLOGIC: No focal weakness or paresthesias are detected. SKIN: there is a wound on the tip of the right great toe and also on the lateral aspect of the fifth metatarsal on the right foot. He has a superficial wound on the anterior aspect of his left leg. He has significant chronic venous insufficiency bilaterally with hyperpigmentation. PSYCHIATRIC: The patient has a normal affect.  DATA:  I have independently interpreted duplex of the left lower extremity today which shows triphasic signals through the popliteal artery with monophasic signals in the posterior tibial and anterior tibial arteries. Peroneal artery has a triphasic signal.  I have reviewed the arterial Doppler study from 12/09/2014 which showed a biphasic posterior tibial signal bilaterally with monophasic anterior tibial signals. ABI on the right was 93% on the left 74%. These may be falsely elevated because of calcific disease. Toe pressure on the right was 73 mmHg. Toe pressure on the left could not be obtained.  MEDICAL ISSUES:  NONHEALING WOUNDS OF THE RIGHT FOOT: This patient has a nonhealing wound on the lateral aspect of the right fifth metatarsal and also at the tip of the right great toe. He has  evidence of some tibial artery occlusive disease and for this reason I have recommended arteriography in order to assess his circulation and chance for healing. If there is disease amenable to angioplasty this contention be addressed at the same time. He would not be a good candidate for bypass given his age, significant cardiac disease including a history of aortic stenosis. He is scheduled to see Dr. Tilley for an ECHO soon. I'll proceed with arteriography. If this shows adequate circulation then he should be considered for right 5th toe amputation under local anesthesia, if his cardiac workup is complete.  I have reviewed with the patient the indications for arteriography. In addition, I have reviewed the potential complications of arteriography including but not limited to: Bleeding, arterial injury, arterial thrombosis, dye action, renal insufficiency, or other unpredictable medical problems. I have explained to the patient that if we find disease amenable to angioplasty we could potentially address this at the same time. I have discussed the potential complications of angioplasty and stenting, including but not limited to: Bleeding, arterial thrombosis, arterial injury, dissection, or the need for surgical intervention.  This procedure is tentatively scheduled for 01/04/2015.   Dickson, [name] [facility] [place]: [phone]    

## 2015-01-08 NOTE — Progress Notes (Signed)
Site area: LFA Site Prior to Removal:  Level 0 Pressure Applied For:20 Manual:   yes Patient Status During Pull:  stable Post Pull Site:  Level stable Post Pull Instructions Given:   Post Pull Pulses Present: doppler Dressing Applied:  clear Bedrest begins @ 0855 Comments:removed by Jannet Mantis

## 2015-01-08 NOTE — Interval H&P Note (Signed)
History and Physical Interval Note:  01/08/2015 7:33 AM  [name]  has presented today for surgery, with the diagnosis of pvd with non healing right toe wound  The various methods of treatment have been discussed with the patient and family. After consideration of risks, benefits and other options for treatment, the patient has consented to  Procedure(s): Abdominal Aortogram (N/A) as a surgical intervention .  The patient's history has been reviewed, patient examined, no change in status, stable for surgery.  I have reviewed the patient's chart and labs.  Questions were answered to the patient's satisfaction.     Waverly Ferrari

## 2015-01-09 ENCOUNTER — Emergency Department (HOSPITAL_COMMUNITY): Payer: Medicare Other

## 2015-01-09 ENCOUNTER — Inpatient Hospital Stay (HOSPITAL_COMMUNITY): Payer: Medicare Other

## 2015-01-09 ENCOUNTER — Encounter (HOSPITAL_COMMUNITY): Payer: Self-pay | Admitting: *Deleted

## 2015-01-09 ENCOUNTER — Other Ambulatory Visit: Payer: Self-pay

## 2015-01-09 ENCOUNTER — Inpatient Hospital Stay (HOSPITAL_COMMUNITY)
Admission: EM | Admit: 2015-01-09 | Discharge: 2015-01-28 | DRG: 207 | Disposition: A | Discharge status: Skilled Nursing Facility | Payer: Medicare Other | Attending: Internal Medicine | Admitting: Internal Medicine

## 2015-01-09 DIAGNOSIS — R4182 Altered mental status, unspecified: Secondary | ICD-10-CM

## 2015-01-09 DIAGNOSIS — G9340 Encephalopathy, unspecified: Secondary | ICD-10-CM | POA: Insufficient documentation

## 2015-01-09 DIAGNOSIS — I70239 Atherosclerosis of native arteries of right leg with ulceration of unspecified site: Secondary | ICD-10-CM | POA: Diagnosis present

## 2015-01-09 DIAGNOSIS — R410 Disorientation, unspecified: Secondary | ICD-10-CM | POA: Diagnosis not present

## 2015-01-09 DIAGNOSIS — Z7902 Long term (current) use of antithrombotics/antiplatelets: Secondary | ICD-10-CM

## 2015-01-09 DIAGNOSIS — N179 Acute kidney failure, unspecified: Secondary | ICD-10-CM | POA: Diagnosis present

## 2015-01-09 DIAGNOSIS — I509 Heart failure, unspecified: Secondary | ICD-10-CM | POA: Diagnosis not present

## 2015-01-09 DIAGNOSIS — J189 Pneumonia, unspecified organism: Secondary | ICD-10-CM

## 2015-01-09 DIAGNOSIS — R339 Retention of urine, unspecified: Secondary | ICD-10-CM | POA: Diagnosis not present

## 2015-01-09 DIAGNOSIS — I350 Nonrheumatic aortic (valve) stenosis: Secondary | ICD-10-CM | POA: Diagnosis present

## 2015-01-09 DIAGNOSIS — R570 Cardiogenic shock: Secondary | ICD-10-CM | POA: Diagnosis not present

## 2015-01-09 DIAGNOSIS — Z9841 Cataract extraction status, right eye: Secondary | ICD-10-CM

## 2015-01-09 DIAGNOSIS — E1159 Type 2 diabetes mellitus with other circulatory complications: Secondary | ICD-10-CM | POA: Diagnosis present

## 2015-01-09 DIAGNOSIS — L97519 Non-pressure chronic ulcer of other part of right foot with unspecified severity: Secondary | ICD-10-CM | POA: Diagnosis present

## 2015-01-09 DIAGNOSIS — J9690 Respiratory failure, unspecified, unspecified whether with hypoxia or hypercapnia: Secondary | ICD-10-CM

## 2015-01-09 DIAGNOSIS — E669 Obesity, unspecified: Secondary | ICD-10-CM | POA: Diagnosis present

## 2015-01-09 DIAGNOSIS — Z452 Encounter for adjustment and management of vascular access device: Secondary | ICD-10-CM

## 2015-01-09 DIAGNOSIS — E785 Hyperlipidemia, unspecified: Secondary | ICD-10-CM | POA: Diagnosis present

## 2015-01-09 DIAGNOSIS — R1310 Dysphagia, unspecified: Secondary | ICD-10-CM | POA: Diagnosis not present

## 2015-01-09 DIAGNOSIS — J811 Chronic pulmonary edema: Secondary | ICD-10-CM

## 2015-01-09 DIAGNOSIS — J9621 Acute and chronic respiratory failure with hypoxia: Secondary | ICD-10-CM | POA: Diagnosis present

## 2015-01-09 DIAGNOSIS — N400 Enlarged prostate without lower urinary tract symptoms: Secondary | ICD-10-CM | POA: Diagnosis present

## 2015-01-09 DIAGNOSIS — I5033 Acute on chronic diastolic (congestive) heart failure: Secondary | ICD-10-CM | POA: Diagnosis not present

## 2015-01-09 DIAGNOSIS — I214 Non-ST elevation (NSTEMI) myocardial infarction: Secondary | ICD-10-CM | POA: Diagnosis not present

## 2015-01-09 DIAGNOSIS — D696 Thrombocytopenia, unspecified: Secondary | ICD-10-CM | POA: Diagnosis present

## 2015-01-09 DIAGNOSIS — R011 Cardiac murmur, unspecified: Secondary | ICD-10-CM

## 2015-01-09 DIAGNOSIS — I219 Acute myocardial infarction, unspecified: Secondary | ICD-10-CM

## 2015-01-09 DIAGNOSIS — I130 Hypertensive heart and chronic kidney disease with heart failure and stage 1 through stage 4 chronic kidney disease, or unspecified chronic kidney disease: Secondary | ICD-10-CM | POA: Diagnosis present

## 2015-01-09 DIAGNOSIS — G4733 Obstructive sleep apnea (adult) (pediatric): Secondary | ICD-10-CM | POA: Diagnosis present

## 2015-01-09 DIAGNOSIS — J9602 Acute respiratory failure with hypercapnia: Secondary | ICD-10-CM | POA: Diagnosis not present

## 2015-01-09 DIAGNOSIS — R7989 Other specified abnormal findings of blood chemistry: Secondary | ICD-10-CM | POA: Insufficient documentation

## 2015-01-09 DIAGNOSIS — E1122 Type 2 diabetes mellitus with diabetic chronic kidney disease: Secondary | ICD-10-CM | POA: Diagnosis present

## 2015-01-09 DIAGNOSIS — Z8673 Personal history of transient ischemic attack (TIA), and cerebral infarction without residual deficits: Secondary | ICD-10-CM | POA: Diagnosis not present

## 2015-01-09 DIAGNOSIS — R579 Shock, unspecified: Secondary | ICD-10-CM | POA: Diagnosis not present

## 2015-01-09 DIAGNOSIS — E1165 Type 2 diabetes mellitus with hyperglycemia: Secondary | ICD-10-CM | POA: Diagnosis present

## 2015-01-09 DIAGNOSIS — R7881 Bacteremia: Secondary | ICD-10-CM | POA: Insufficient documentation

## 2015-01-09 DIAGNOSIS — E872 Acidosis: Secondary | ICD-10-CM | POA: Diagnosis present

## 2015-01-09 DIAGNOSIS — E871 Hypo-osmolality and hyponatremia: Secondary | ICD-10-CM | POA: Diagnosis present

## 2015-01-09 DIAGNOSIS — Z9842 Cataract extraction status, left eye: Secondary | ICD-10-CM

## 2015-01-09 DIAGNOSIS — N183 Chronic kidney disease, stage 3 unspecified: Secondary | ICD-10-CM | POA: Diagnosis present

## 2015-01-09 DIAGNOSIS — J9601 Acute respiratory failure with hypoxia: Secondary | ICD-10-CM | POA: Diagnosis not present

## 2015-01-09 DIAGNOSIS — I110 Hypertensive heart disease with heart failure: Secondary | ICD-10-CM | POA: Diagnosis not present

## 2015-01-09 DIAGNOSIS — Z978 Presence of other specified devices: Secondary | ICD-10-CM | POA: Insufficient documentation

## 2015-01-09 DIAGNOSIS — I2510 Atherosclerotic heart disease of native coronary artery without angina pectoris: Secondary | ICD-10-CM | POA: Diagnosis present

## 2015-01-09 DIAGNOSIS — B957 Other staphylococcus as the cause of diseases classified elsewhere: Secondary | ICD-10-CM | POA: Diagnosis not present

## 2015-01-09 DIAGNOSIS — R918 Other nonspecific abnormal finding of lung field: Secondary | ICD-10-CM

## 2015-01-09 DIAGNOSIS — Z955 Presence of coronary angioplasty implant and graft: Secondary | ICD-10-CM

## 2015-01-09 DIAGNOSIS — R140 Abdominal distension (gaseous): Secondary | ICD-10-CM

## 2015-01-09 DIAGNOSIS — Z888 Allergy status to other drugs, medicaments and biological substances status: Secondary | ICD-10-CM

## 2015-01-09 DIAGNOSIS — R259 Unspecified abnormal involuntary movements: Secondary | ICD-10-CM | POA: Insufficient documentation

## 2015-01-09 DIAGNOSIS — Z79899 Other long term (current) drug therapy: Secondary | ICD-10-CM | POA: Diagnosis not present

## 2015-01-09 DIAGNOSIS — I119 Hypertensive heart disease without heart failure: Secondary | ICD-10-CM | POA: Diagnosis present

## 2015-01-09 DIAGNOSIS — R443 Hallucinations, unspecified: Secondary | ICD-10-CM | POA: Diagnosis present

## 2015-01-09 DIAGNOSIS — R778 Other specified abnormalities of plasma proteins: Secondary | ICD-10-CM | POA: Insufficient documentation

## 2015-01-09 DIAGNOSIS — E876 Hypokalemia: Secondary | ICD-10-CM | POA: Diagnosis not present

## 2015-01-09 DIAGNOSIS — Z89021 Acquired absence of right finger(s): Secondary | ICD-10-CM

## 2015-01-09 DIAGNOSIS — Z4659 Encounter for fitting and adjustment of other gastrointestinal appliance and device: Secondary | ICD-10-CM

## 2015-01-09 DIAGNOSIS — J9600 Acute respiratory failure, unspecified whether with hypoxia or hypercapnia: Secondary | ICD-10-CM | POA: Diagnosis not present

## 2015-01-09 DIAGNOSIS — R0602 Shortness of breath: Secondary | ICD-10-CM | POA: Diagnosis present

## 2015-01-09 DIAGNOSIS — I872 Venous insufficiency (chronic) (peripheral): Secondary | ICD-10-CM | POA: Diagnosis present

## 2015-01-09 DIAGNOSIS — R5381 Other malaise: Secondary | ICD-10-CM | POA: Diagnosis not present

## 2015-01-09 DIAGNOSIS — E1151 Type 2 diabetes mellitus with diabetic peripheral angiopathy without gangrene: Secondary | ICD-10-CM | POA: Diagnosis present

## 2015-01-09 DIAGNOSIS — E870 Hyperosmolality and hypernatremia: Secondary | ICD-10-CM | POA: Diagnosis not present

## 2015-01-09 DIAGNOSIS — K219 Gastro-esophageal reflux disease without esophagitis: Secondary | ICD-10-CM | POA: Diagnosis present

## 2015-01-09 DIAGNOSIS — Z6837 Body mass index (BMI) 37.0-37.9, adult: Secondary | ICD-10-CM

## 2015-01-09 DIAGNOSIS — Z7982 Long term (current) use of aspirin: Secondary | ICD-10-CM | POA: Diagnosis not present

## 2015-01-09 DIAGNOSIS — M109 Gout, unspecified: Secondary | ICD-10-CM | POA: Diagnosis present

## 2015-01-09 DIAGNOSIS — R0600 Dyspnea, unspecified: Secondary | ICD-10-CM

## 2015-01-09 DIAGNOSIS — R1110 Vomiting, unspecified: Secondary | ICD-10-CM

## 2015-01-09 DIAGNOSIS — I252 Old myocardial infarction: Secondary | ICD-10-CM | POA: Diagnosis not present

## 2015-01-09 DIAGNOSIS — I359 Nonrheumatic aortic valve disorder, unspecified: Secondary | ICD-10-CM | POA: Diagnosis not present

## 2015-01-09 DIAGNOSIS — M1A9XX1 Chronic gout, unspecified, with tophus (tophi): Secondary | ICD-10-CM | POA: Diagnosis not present

## 2015-01-09 DIAGNOSIS — L8990 Pressure ulcer of unspecified site, unspecified stage: Secondary | ICD-10-CM | POA: Insufficient documentation

## 2015-01-09 LAB — CBC WITH DIFFERENTIAL/PLATELET
Basophils Absolute: 0 10*3/uL (ref 0.0–0.1)
Basophils Relative: 0 % (ref 0–1)
EOS PCT: 2 % (ref 0–5)
Eosinophils Absolute: 0.2 10*3/uL (ref 0.0–0.7)
HEMATOCRIT: 46.6 % (ref 39.0–52.0)
HEMOGLOBIN: 15.2 g/dL (ref 13.0–17.0)
Lymphocytes Relative: 12 % (ref 12–46)
Lymphs Abs: 1.1 10*3/uL (ref 0.7–4.0)
MCH: 29.7 pg (ref 26.0–34.0)
MCHC: 32.6 g/dL (ref 30.0–36.0)
MCV: 91 fL (ref 78.0–100.0)
MONOS PCT: 9 % (ref 3–12)
Monocytes Absolute: 0.9 10*3/uL (ref 0.1–1.0)
Neutro Abs: 7.5 10*3/uL (ref 1.7–7.7)
Neutrophils Relative %: 77 % (ref 43–77)
Platelets: 131 10*3/uL — ABNORMAL LOW (ref 150–400)
RBC: 5.12 MIL/uL (ref 4.22–5.81)
RDW: 15.5 % (ref 11.5–15.5)
WBC: 9.7 10*3/uL (ref 4.0–10.5)

## 2015-01-09 LAB — URINALYSIS, ROUTINE W REFLEX MICROSCOPIC
BILIRUBIN URINE: NEGATIVE
GLUCOSE, UA: NEGATIVE mg/dL
HGB URINE DIPSTICK: NEGATIVE
KETONES UR: NEGATIVE mg/dL
LEUKOCYTES UA: NEGATIVE
NITRITE: NEGATIVE
PROTEIN: NEGATIVE mg/dL
Specific Gravity, Urine: 1.021 (ref 1.005–1.030)
Urobilinogen, UA: 0.2 mg/dL (ref 0.0–1.0)
pH: 5 (ref 5.0–8.0)

## 2015-01-09 LAB — I-STAT CG4 LACTIC ACID, ED: LACTIC ACID, VENOUS: 2.46 mmol/L — AB (ref 0.5–2.0)

## 2015-01-09 LAB — I-STAT CHEM 8, ED
BUN: 34 mg/dL — ABNORMAL HIGH (ref 6–20)
Calcium, Ion: 1.15 mmol/L (ref 1.13–1.30)
Chloride: 101 mmol/L (ref 101–111)
Creatinine, Ser: 1.6 mg/dL — ABNORMAL HIGH (ref 0.61–1.24)
Glucose, Bld: 164 mg/dL — ABNORMAL HIGH (ref 65–99)
HEMATOCRIT: 50 % (ref 39.0–52.0)
HEMOGLOBIN: 17 g/dL (ref 13.0–17.0)
Potassium: 4.5 mmol/L (ref 3.5–5.1)
Sodium: 140 mmol/L (ref 135–145)
TCO2: 27 mmol/L (ref 0–100)

## 2015-01-09 LAB — COMPREHENSIVE METABOLIC PANEL
ALT: 17 U/L (ref 17–63)
ANION GAP: 9 (ref 5–15)
AST: 26 U/L (ref 15–41)
Albumin: 3.5 g/dL (ref 3.5–5.0)
Alkaline Phosphatase: 100 U/L (ref 38–126)
BUN: 30 mg/dL — AB (ref 6–20)
CO2: 28 mmol/L (ref 22–32)
Calcium: 9.1 mg/dL (ref 8.9–10.3)
Chloride: 100 mmol/L — ABNORMAL LOW (ref 101–111)
Creatinine, Ser: 1.56 mg/dL — ABNORMAL HIGH (ref 0.61–1.24)
GFR calc Af Amer: 47 mL/min — ABNORMAL LOW (ref >60)
GFR calc non Af Amer: 41 mL/min — ABNORMAL LOW (ref >60)
Glucose, Bld: 168 mg/dL — ABNORMAL HIGH (ref 65–99)
Potassium: 4.5 mmol/L (ref 3.5–5.1)
Sodium: 137 mmol/L (ref 135–145)
Total Bilirubin: 0.7 mg/dL (ref 0.3–1.2)
Total Protein: 7.3 g/dL (ref 6.5–8.1)

## 2015-01-09 LAB — BRAIN NATRIURETIC PEPTIDE: B Natriuretic Peptide: 168.2 pg/mL — ABNORMAL HIGH (ref 0.0–100.0)

## 2015-01-09 LAB — LIPASE, BLOOD: Lipase: 26 U/L (ref 22–51)

## 2015-01-09 LAB — TROPONIN I: TROPONIN I: 0.03 ng/mL (ref <0.031)

## 2015-01-09 MED ORDER — MAGNESIUM 250 MG PO TABS: 250.0000 mg | ORAL_TABLET | Freq: Every day | ORAL | Status: DC

## 2015-01-09 MED ORDER — ALBUTEROL SULFATE (2.5 MG/3ML) 0.083% IN NEBU
2.5000 mg | INHALATION_SOLUTION | RESPIRATORY_TRACT | Status: DC | PRN
  Administered 2015-01-09: 2.5 mg via RESPIRATORY_TRACT
  Filled 2015-01-09: qty 3

## 2015-01-09 MED ORDER — PREGABALIN 75 MG PO CAPS
200.0000 mg | ORAL_CAPSULE | Freq: Every day | ORAL | Status: DC
  Administered 2015-01-09: 200 mg via ORAL
  Filled 2015-01-09: qty 2

## 2015-01-09 MED ORDER — AMLODIPINE BESYLATE 5 MG PO TABS
5.0000 mg | ORAL_TABLET | Freq: Two times a day (BID) | ORAL | Status: DC
  Administered 2015-01-09: 5 mg via ORAL
  Filled 2015-01-09 (×6): qty 1

## 2015-01-09 MED ORDER — FUROSEMIDE 40 MG PO TABS
60.0000 mg | ORAL_TABLET | Freq: Two times a day (BID) | ORAL | Status: DC
  Administered 2015-01-09 (×2): 60 mg via ORAL
  Filled 2015-01-09 (×5): qty 1

## 2015-01-09 MED ORDER — MAGNESIUM OXIDE 400 (241.3 MG) MG PO TABS
400.0000 mg | ORAL_TABLET | Freq: Every day | ORAL | Status: DC
  Administered 2015-01-09 – 2015-01-19 (×11): 400 mg via ORAL
  Filled 2015-01-09 (×12): qty 1

## 2015-01-09 MED ORDER — MIRABEGRON ER 50 MG PO TB24
50.0000 mg | ORAL_TABLET | Freq: Every day | ORAL | Status: DC
  Administered 2015-01-09 – 2015-01-10 (×2): 50 mg via ORAL
  Filled 2015-01-09 (×3): qty 1

## 2015-01-09 MED ORDER — FENTANYL CITRATE (PF) 100 MCG/2ML IJ SOLN
50.0000 ug | Freq: Once | INTRAMUSCULAR | Status: AC
  Administered 2015-01-09: 50 ug via INTRAVENOUS
  Filled 2015-01-09: qty 2

## 2015-01-09 MED ORDER — HEPARIN SODIUM (PORCINE) 5000 UNIT/ML IJ SOLN
5000.0000 [IU] | Freq: Three times a day (TID) | INTRAMUSCULAR | Status: DC
  Administered 2015-01-09 – 2015-01-10 (×3): 5000 [IU] via SUBCUTANEOUS
  Filled 2015-01-09 (×7): qty 1

## 2015-01-09 MED ORDER — DOCUSATE SODIUM 100 MG PO CAPS
100.0000 mg | ORAL_CAPSULE | Freq: Two times a day (BID) | ORAL | Status: DC
  Administered 2015-01-09 (×2): 100 mg via ORAL
  Filled 2015-01-09 (×6): qty 1

## 2015-01-09 MED ORDER — SALINE SPRAY 0.65 % NA SOLN
1.0000 | NASAL | Status: DC | PRN
Start: 2015-01-09 — End: 2015-01-28
  Administered 2015-01-10: 1 via NASAL
  Filled 2015-01-09: qty 44

## 2015-01-09 MED ORDER — METOPROLOL SUCCINATE ER 50 MG PO TB24
50.0000 mg | ORAL_TABLET | Freq: Every day | ORAL | Status: DC
  Filled 2015-01-09 (×2): qty 1

## 2015-01-09 MED ORDER — ALLOPURINOL 300 MG PO TABS
300.0000 mg | ORAL_TABLET | Freq: Every morning | ORAL | Status: DC
  Administered 2015-01-09 – 2015-01-11 (×3): 300 mg via ORAL
  Filled 2015-01-09 (×4): qty 1

## 2015-01-09 MED ORDER — BUPROPION HCL ER (XL) 150 MG PO TB24
150.0000 mg | ORAL_TABLET | Freq: Every morning | ORAL | Status: DC
  Administered 2015-01-09 – 2015-01-10 (×2): 150 mg via ORAL
  Filled 2015-01-09 (×3): qty 1

## 2015-01-09 MED ORDER — ACETAMINOPHEN 325 MG PO TABS
325.0000 mg | ORAL_TABLET | Freq: Four times a day (QID) | ORAL | Status: DC | PRN
  Administered 2015-01-09 – 2015-01-11 (×3): 325 mg via ORAL
  Filled 2015-01-09 (×3): qty 1

## 2015-01-09 MED ORDER — CLOPIDOGREL BISULFATE 75 MG PO TABS
75.0000 mg | ORAL_TABLET | Freq: Every day | ORAL | Status: DC
  Administered 2015-01-09 – 2015-01-19 (×11): 75 mg via ORAL
  Filled 2015-01-09 (×12): qty 1

## 2015-01-09 MED ORDER — ROPINIROLE HCL 1 MG PO TABS
4.0000 mg | ORAL_TABLET | Freq: Every day | ORAL | Status: DC
  Administered 2015-01-09 – 2015-01-10 (×2): 4 mg via ORAL
  Filled 2015-01-09 (×4): qty 4

## 2015-01-09 MED ORDER — SODIUM CHLORIDE 0.9 % IV BOLUS (SEPSIS)
500.0000 mL | Freq: Once | INTRAVENOUS | Status: AC
  Administered 2015-01-09: 500 mL via INTRAVENOUS

## 2015-01-09 MED ORDER — ASPIRIN EC 81 MG PO TBEC
81.0000 mg | DELAYED_RELEASE_TABLET | Freq: Every day | ORAL | Status: DC
  Administered 2015-01-09 – 2015-01-10 (×2): 81 mg via ORAL
  Filled 2015-01-09 (×4): qty 1

## 2015-01-09 MED ORDER — IOHEXOL 300 MG/ML  SOLN
25.0000 mL | Freq: Once | INTRAMUSCULAR | Status: AC | PRN
  Administered 2015-01-09: 25 mL via ORAL

## 2015-01-09 MED ORDER — TAMSULOSIN HCL 0.4 MG PO CAPS
0.4000 mg | ORAL_CAPSULE | Freq: Every day | ORAL | Status: DC
  Administered 2015-01-09 – 2015-01-10 (×2): 0.4 mg via ORAL
  Filled 2015-01-09 (×4): qty 1

## 2015-01-09 MED ORDER — MIRTAZAPINE 15 MG PO TABS
15.0000 mg | ORAL_TABLET | Freq: Every day | ORAL | Status: DC
  Administered 2015-01-09: 15 mg via ORAL
  Filled 2015-01-09 (×3): qty 1

## 2015-01-09 MED ORDER — FUROSEMIDE 40 MG PO TABS
40.0000 mg | ORAL_TABLET | Freq: Two times a day (BID) | ORAL | Status: DC
  Filled 2015-01-09 (×3): qty 1

## 2015-01-09 MED ORDER — IOHEXOL 300 MG/ML  SOLN
80.0000 mL | Freq: Once | INTRAMUSCULAR | Status: AC | PRN
  Administered 2015-01-09: 80 mL via INTRAVENOUS

## 2015-01-09 MED ORDER — LORAZEPAM 1 MG PO TABS
1.0000 mg | ORAL_TABLET | Freq: Three times a day (TID) | ORAL | Status: DC | PRN
  Administered 2015-01-09: 1 mg via ORAL
  Filled 2015-01-09: qty 1

## 2015-01-09 MED ORDER — ONDANSETRON HCL 4 MG/2ML IJ SOLN
4.0000 mg | Freq: Once | INTRAMUSCULAR | Status: AC
  Administered 2015-01-09: 4 mg via INTRAVENOUS
  Filled 2015-01-09: qty 2

## 2015-01-09 MED ORDER — PANTOPRAZOLE SODIUM 40 MG PO TBEC
40.0000 mg | DELAYED_RELEASE_TABLET | Freq: Two times a day (BID) | ORAL | Status: DC
  Administered 2015-01-09 (×2): 40 mg via ORAL
  Filled 2015-01-09 (×2): qty 1

## 2015-01-09 NOTE — H&P (Signed)
[facility] History and Physical  [name] RUE:[id number] DOB: April 09, 1933 DOA: 01/09/2015  Referring physician: EDP PCP: [name], MD   Chief Complaint: SOB   HPI: [name] is a 81 y.o. male with pmh of CAD, DM, CKD.  Patient presents to the ED with c/o intermittent, ongoing, and unchanged SOB x2 weeks.  Worsened today.  SOB is worsened with mild exertion.  Severe.  No alleviating factors.  Intermittent abdominal pain for the past few days.  Patient discharged from hospital yesterday after angiogram to LLE.  SOB worse since discharge.  He has no formal h/o COPD or CHF.  Not wheezing, no cough.  Does have aortic stenosis, moderate by echo last November.  Just had echo done at Dr. York Spaniel office this week, dosent know results of this.  Review of Systems: Systems reviewed.  As above, otherwise negative  Past Medical History  Diagnosis Date  . Gout, unspecified     severe dz, "treatment done that last 15 years"  . Obesity (BMI 30-39.9)   . Restless leg syndrome   . History of retinal detachment 1994  . Hypertensive heart disease   . CAD (coronary artery disease), native coronary artery   . History of pericarditis 2007    MSSA, s/p pericardial window  . GERD   . Osteoarthritis   . Sleep apnea in adult     CPAP qhs  . TIA (transient ischemic attack) 2005  . BPH (benign prostatic hypertrophy)     "minor"  . Lumbar spinal stenosis   . Carotid artery occlusion   . Unspecified venous (peripheral) insufficiency   . Stroke May 2003  . Anxiety   . Depression   . Aortic stenosis     ECHO 05/29/14 moderate AS Mean gradient 21 mm EF 55% ECHO 07/09/13  Mean gradient 12 mm Mild to moderate AS EF 50%    . CAD (coronary artery disease)     02/11/09  Xience stent to circumflex 2.5 x 18 mm postdilated to 2.75 mm  Cath sept 2011  normal Left main, 20 % stenosis proximal LAD, 40% stenosis mid LAD, 70% stenosis proximal Diag 1, 90% stenosis prox CFX, 90% first OM, small  and nondominant RCA;   . Carotid artery disease     Prior TIA 2006 with left CEA by Dr. Arbie Cookey Recurrent TIA in April 2011   . Type 2 diabetes mellitus with vascular disease   . Lumbar disc disease   . CKD (chronic kidney disease), stage III 02/08/2014  . Hypertension   . CHF (congestive heart failure)   . Renal insufficiency    Past Surgical History  Procedure Laterality Date  . Pericardial window  2007  . Left arm  2008    shoulder  . Right arm  1970's    shoulder  . Cataract extraction      Left side x's 2 and right  . Retinal detachment surgery      left side  . Carotid endarterectomy Left 12-06-05    cea  . Coronary angioplasty  01/2009    x 1 stent  . Colonoscopy    . Carpal tunnel release Bilateral   . Back surgery  2013    removed bone spurs  . Inguinal hernia repair Left 10/01/2013    Procedure: HERNIA REPAIR INGUINAL ADULT;  Surgeon: Axel Filler, MD;  Location: [facility];  Service: General;  Laterality: Left;  . Insertion of mesh Left 10/01/2013    Procedure: INSERTION OF MESH;  Surgeon:  Axel Filler, MD;  Location: [facility];  Service: General;  Laterality: Left;  . Finger amputation  May 2015    JUST TO FIRST JOINT RIGHT HAND  LAST FINGER ( Right 5th finger)   Social History:  reports that he has never smoked. He has never used smokeless tobacco. He reports that he does not drink alcohol or use illicit drugs.  Allergies  Allergen Reactions  . Statins Other (See Comments)    Severe leg myalgias, weakness    Family History  Problem Relation Age of Onset  . Heart disease Mother     Before age 41  . Heart attack Mother   . Diabetes Father   . Heart disease Father   . Breast cancer Sister   . Cancer Sister   . Prostate cancer Brother   . Lung cancer Brother   . Cancer Brother   . Diabetes Daughter   . Diabetes Son   . Hypertension Son      Prior to Admission medications   Medication Sig Start Date End Date Taking? Authorizing Provider  acetaminophen  (TYLENOL) 325 MG tablet Take 2 tablets (650 mg total) by mouth every 6 (six) hours as needed for mild pain or moderate pain. Patient taking differently: Take 325 mg by mouth every 6 (six) hours as needed for mild pain or moderate pain.  07/21/14  Yes Elease Etienne, MD  allopurinol (ZYLOPRIM) 300 MG tablet Take 1 tablet (300 mg total) by mouth every morning. 10/06/14  Yes Newt Lukes, MD  amLODipine (NORVASC) 5 MG tablet Take 5 mg by mouth 2 (two) times daily. 11/18/14  Yes Historical Provider, MD  aspirin EC 81 MG tablet Take 81 mg by mouth daily.   Yes Historical Provider, MD  buPROPion (WELLBUTRIN XL) 150 MG 24 hr tablet Take 1 tablet (150 mg total) by mouth every morning. 10/06/14  Yes Newt Lukes, MD  cholecalciferol (VITAMIN D) 1000 UNITS tablet Take 1,000 Units by mouth daily.   Yes Historical Provider, MD  clopidogrel (PLAVIX) 75 MG tablet Take 1 tablet (75 mg total) by mouth daily. 11/11/14  Yes Newt Lukes, MD  docusate sodium 100 MG CAPS Take 100 mg by mouth 2 (two) times daily. 07/16/14  Yes Ripudeep Jenna Luo, MD  Ferrous Gluconate 256 (28 FE) MG TABS Take 256 mg by mouth daily.   Yes Historical Provider, MD  furosemide (LASIX) 40 MG tablet Take 1 tablet (40 mg total) by mouth daily. Patient taking differently: Take 40 mg by mouth 2 (two) times daily.  07/21/14  Yes Elease Etienne, MD  LORazepam (ATIVAN) 1 MG tablet Take 1 tablet (1 mg total) by mouth every 8 (eight) hours as needed for anxiety or sleep. 09/10/14  Yes Newt Lukes, MD  Magnesium 250 MG TABS Take 250 mg by mouth daily.    Yes Historical Provider, MD  metoprolol succinate (TOPROL-XL) 50 MG 24 hr tablet Take 1 tablet (50 mg total) by mouth daily. Take with or immediately following a meal. 11/11/14  Yes Newt Lukes, MD  mirabegron ER (MYRBETRIQ) 50 MG TB24 tablet Take 1 tablet (50 mg total) by mouth daily. 10/06/14  Yes Newt Lukes, MD  mirtazapine (REMERON) 15 MG tablet Take 1 tablet (15 mg  total) by mouth at bedtime. 09/10/14  Yes Newt Lukes, MD  pantoprazole (PROTONIX) 40 MG tablet Take 1 tablet (40 mg total) by mouth 2 (two) times daily. 10/06/14  Yes Newt Lukes, MD  pregabalin (LYRICA) 200 MG capsule Take 1 capsule (200 mg total) by mouth at bedtime. 12/15/14  Yes Newt Lukes, MD  rOPINIRole (REQUIP) 4 MG tablet Take 1 tablet (4 mg total) by mouth at bedtime. 10/06/14  Yes Newt Lukes, MD  sodium chloride (OCEAN) 0.65 % SOLN nasal spray Place 1 spray into both nostrils as needed for congestion.   Yes Historical Provider, MD  tamsulosin (FLOMAX) 0.4 MG CAPS capsule Take 1 capsule (0.4 mg total) by mouth daily. 11/11/14  Yes Newt Lukes, MD   Physical Exam: Filed Vitals:   01/09/15 0430  BP: 116/44  Pulse: 66  Temp:   Resp: 22    BP 116/44 mmHg  Pulse 66  Temp(Src) 98.6 F (37 C) (Oral)  Resp 22  SpO2 93%  General Appearance:    Alert, oriented, no distress, appears stated age  Head:    Normocephalic, atraumatic  Eyes:    PERRL, EOMI, sclera non-icteric        Nose:   Nares without drainage or epistaxis. Mucosa, turbinates normal  Throat:   Moist mucous membranes. Oropharynx without erythema or exudate.  Neck:   Supple. No carotid bruits.  No thyromegaly.  No lymphadenopathy.   Back:     No CVA tenderness, no spinal tenderness  Lungs:     Clear to auscultation bilaterally, without wheezes, rhonchi or rales  Chest wall:    No tenderness to palpitation  Heart:    Regular rate and rhythm, the patient has a 5/6 murmur, this is one of the loudest I have heard.  Abdomen:     Soft, non-tender, nondistended, normal bowel sounds, no organomegaly  Genitalia:    deferred  Rectal:    deferred  Extremities:   No clubbing, cyanosis or edema.  Pulses:   2+ and symmetric all extremities  Skin:   Skin color, texture, turgor normal, no rashes or lesions  Lymph nodes:   Cervical, supraclavicular, and axillary nodes normal  Neurologic:   CNII-XII  intact. Normal strength, sensation and reflexes      throughout    Labs on Admission:  Basic Metabolic Panel:  Recent Labs Lab 01/08/15 0624 01/09/15 0103 01/09/15 0121  NA 141 137 140  K 4.1 4.5 4.5  CL 100* 100* 101  CO2  --  28  --   GLUCOSE 107* 168* 164*  BUN 39* 30* 34*  CREATININE 1.70* 1.56* 1.60*  CALCIUM  --  9.1  --    Liver Function Tests:  Recent Labs Lab 01/09/15 0103  AST 26  ALT 17  ALKPHOS 100  BILITOT 0.7  PROT 7.3  ALBUMIN 3.5    Recent Labs Lab 01/09/15 0103  LIPASE 26   No results for input(s): AMMONIA in the last 168 hours. CBC:  Recent Labs Lab 01/08/15 0624 01/09/15 0103 01/09/15 0121  WBC  --  9.7  --   NEUTROABS  --  7.5  --   HGB 17.0 15.2 17.0  HCT 50.0 46.6 50.0  MCV  --  91.0  --   PLT  --  131*  --    Cardiac Enzymes:  Recent Labs Lab 01/09/15 0103  TROPONINI 0.03    BNP (last 3 results)  Recent Labs  05/28/14 1508 07/10/14 0801 07/13/14 1928  PROBNP 477.3* 422.7 814.6*   CBG:  Recent Labs Lab 01/08/15 0555  GLUCAP 96    Radiological Exams on Admission: Dg Chest 2 View  01/09/2015   CLINICAL DATA:  81 year old male with shortness of Breath, recent hospitalization for lower extremity swelling and shortness of Breath. Subsequent encounter.  EXAM: CHEST  2 VIEW  COMPARISON:  07/17/2014 and earlier.  FINDINGS: Semi upright AP and lateral views of the chest. Stable cardiomegaly and mediastinal contours. No pneumothorax, pulmonary edema, pleural effusion or confluent pulmonary opacity. No acute osseous abnormality identified.  IMPRESSION: No acute cardiopulmonary abnormality.   Electronically Signed   By: Odessa Fleming M.D.   On: 01/09/2015 01:26   Ct Abdomen Pelvis W Contrast  01/09/2015   CLINICAL DATA:  Intermittent ongoing shortness of breath for 2 weeks, worse today. Abdominal pain for a few days with nausea. Recent discharge from hospital early this morning after stent placed in the lower extremity.  EXAM: CT  ABDOMEN AND PELVIS WITH CONTRAST  TECHNIQUE: Multidetector CT imaging of the abdomen and pelvis was performed using the standard protocol following bolus administration of intravenous contrast.  CONTRAST:  80mL OMNIPAQUE IOHEXOL 300 MG/ML  SOLN  COMPARISON:  None.  FINDINGS: Atelectasis in the lung bases. Coronary artery and aortic valve calcifications.  Mild diffuse fatty infiltration of the liver. No focal liver lesions. The gallbladder, spleen, pancreas, adrenal glands, kidneys, inferior vena cava, and retroperitoneal lymph nodes are unremarkable. Calcification of the aorta without aneurysm. Calcification of the renal artery origins. Stomach, small bowel, and colon are not abnormally distended. No discrete wall thickening is noted. No free air or free fluid in the abdomen. Abdominal wall musculature appears intact.  Pelvis: The appendix is normal. Prostate gland is enlarged. There is increased density in the bladder which could be early contrast or may indicate hemorrhage. Correlation with urinalysis is recommended. No bladder wall thickening. Mildly enlarged lymph node in the left external iliac chain measuring 16 mm diameter and in the right iliac chain measuring 13 mm diameter, probably reactive. Skin defect and infiltration in the left groin is probably postoperative. Small right inguinal hernia containing fat. Degenerative changes in the spine. No destructive bone lesions. Postoperative laminectomies at L3-4, L4-5, and L5-S1.  IMPRESSION: Postoperative changes in the left groin region with infiltration in the subcutaneous fat, likely postoperative. Prominent lymph nodes in the iliac chains bilaterally are most likely to be reactive. Increased density in the bladder suggest possible hemorrhage. Correlation with urinalysis. Right inguinal hernia containing fat.   Electronically Signed   By: Burman Nieves M.D.   On: 01/09/2015 04:16    EKG: Independently reviewed.  Assessment/Plan Principal Problem:    Acute respiratory failure with hypoxia Active Problems:   Type 2 diabetes mellitus with vascular disease   Dyslipidemia   Hypertensive heart disease   Aortic stenosis   CAD (coronary artery disease)   CKD (chronic kidney disease), stage III   1. Acute respiratory failure with hypoxia - new O2 requirement 1. CXR clear, lungs CTA 2. Suspicious of his aortic valve stenosis which had progressed from mild to moderate from July to November of last year 1. 2d echo ordered since we cant get Dr. York Spaniel records until Monday. 3. O2 prn 4. Tele monitor 2. DM2 - listed on paper, but actually dont see where he is on meds for this right now 3. HTN - continue home meds. 4. HLD - continue home meds 5. CAD - continue plavix     Code Status: Full  Family Communication: No family in room Disposition Plan: Admit to inpatient   Time spent: 70 min  GARDNER, JARED M. [facility] Pager [phone]  If 7AM-7PM, please contact the  day team taking care of the patient [email or web address] Password [facility] 01/09/2015, 5:26 AM

## 2015-01-09 NOTE — Progress Notes (Signed)
  Echocardiogram 2D Echocardiogram has been performed.  [name] 01/09/2015, 2:38 PM

## 2015-01-09 NOTE — ED Notes (Signed)
Attempted report 

## 2015-01-09 NOTE — ED Notes (Addendum)
Pt still says he is not able to use the restroom yet.

## 2015-01-09 NOTE — ED Provider Notes (Addendum)
CSN: [id number]     Arrival date & time 01/09/15  0043 History   First MD Initiated Contact with Patient 01/09/15 0050     This chart was scribed for Marisa Severin, MD by Arlan Organ, ED Scribe. This patient was seen in room A04C/A04C and the patient's care was started 1:26 AM.   Chief Complaint  Patient presents with  . Shortness of Breath   HPI  HPI Comments: [name] is a 81 y.o. male with a PMHx of CAD, TIA, stroke, DM, CKD who presents to the Emergency Department complaining of intermittent, ongoing, unchanged shortness of breath x 2 weeks; worsened today. SOB is worsened with mild and severe exertion without any alleviating factors at this time. Pt also reports intermittent, ongoing abdominal pain x few days. Pain is described as sharp and dull. No OTC medications attempted prior to arrival. Pt was discharge from the hospital early this morning after stent placement to L lower extremity. States SOB worsened since time of discharge along with new onset chills. No recent fever or chest pain. Pt with known allergies to statins.  Past Medical History  Diagnosis Date  . Gout, unspecified     severe dz, "treatment done that last 15 years"  . Obesity (BMI 30-39.9)   . Restless leg syndrome   . History of retinal detachment 1994  . Hypertensive heart disease   . CAD (coronary artery disease), native coronary artery   . History of pericarditis 2007    MSSA, s/p pericardial window  . GERD   . Osteoarthritis   . Sleep apnea in adult     CPAP qhs  . TIA (transient ischemic attack) 2005  . BPH (benign prostatic hypertrophy)     "minor"  . Lumbar spinal stenosis   . Carotid artery occlusion   . Unspecified venous (peripheral) insufficiency   . Stroke May 2003  . Anxiety   . Depression   . Aortic stenosis     ECHO 05/29/14 moderate AS Mean gradient 21 mm EF 55% ECHO 07/09/13  Mean gradient 12 mm Mild to moderate AS EF 50%    . CAD (coronary artery disease)     02/11/09  Xience stent  to circumflex 2.5 x 18 mm postdilated to 2.75 mm  Cath sept 2011  normal Left main, 20 % stenosis proximal LAD, 40% stenosis mid LAD, 70% stenosis proximal Diag 1, 90% stenosis prox CFX, 90% first OM, small and nondominant RCA;   . Carotid artery disease     Prior TIA 2006 with left CEA by Dr. Arbie Cookey Recurrent TIA in April 2011   . Type 2 diabetes mellitus with vascular disease   . Lumbar disc disease   . CKD (chronic kidney disease), stage III 02/08/2014   Past Surgical History  Procedure Laterality Date  . Pericardial window  2007  . Left arm  2008    shoulder  . Right arm  1970's    shoulder  . Cataract extraction      Left side x's 2 and right  . Retinal detachment surgery      left side  . Carotid endarterectomy Left 12-06-05    cea  . Coronary angioplasty  01/2009    x 1 stent  . Colonoscopy    . Carpal tunnel release Bilateral   . Back surgery  2013    removed bone spurs  . Inguinal hernia repair Left 10/01/2013    Procedure: HERNIA REPAIR INGUINAL ADULT;  Surgeon: Axel Filler, MD;  Location: [facility];  Service: General;  Laterality: Left;  . Insertion of mesh Left 10/01/2013    Procedure: INSERTION OF MESH;  Surgeon: Axel Filler, MD;  Location: [facility];  Service: General;  Laterality: Left;  . Finger amputation  May 2015    JUST TO FIRST JOINT RIGHT HAND  LAST FINGER ( Right 5th finger)   Family History  Problem Relation Age of Onset  . Heart disease Mother     Before age 20  . Heart attack Mother   . Diabetes Father   . Heart disease Father   . Breast cancer Sister   . Cancer Sister   . Prostate cancer Brother   . Lung cancer Brother   . Cancer Brother   . Diabetes Daughter   . Diabetes Son   . Hypertension Son    History  Substance Use Topics  . Smoking status: Never Smoker   . Smokeless tobacco: Never Used  . Alcohol Use: No    Review of Systems  Constitutional: Positive for chills. Negative for fever.  Respiratory: Positive for shortness of breath.  Negative for cough.   Cardiovascular: Positive for leg swelling. Negative for chest pain.  Gastrointestinal: Positive for abdominal pain.  All other systems reviewed and are negative.     Allergies  Statins  Home Medications   Prior to Admission medications   Medication Sig Start Date End Date Taking? Authorizing Provider  acetaminophen (TYLENOL) 325 MG tablet Take 2 tablets (650 mg total) by mouth every 6 (six) hours as needed for mild pain or moderate pain. Patient taking differently: Take 325 mg by mouth every 6 (six) hours as needed for mild pain or moderate pain.  07/21/14   Elease Etienne, MD  allopurinol (ZYLOPRIM) 300 MG tablet Take 1 tablet (300 mg total) by mouth every morning. 10/06/14   Newt Lukes, MD  amLODipine (NORVASC) 5 MG tablet Take 5 mg by mouth 2 (two) times daily. 11/18/14   Historical Provider, MD  aspirin EC 81 MG tablet Take 81 mg by mouth daily.    Historical Provider, MD  bisacodyl (DULCOLAX) 10 MG suppository Place 1 suppository (10 mg total) rectally daily as needed for moderate constipation. Patient not taking: Reported on 01/07/2015 06/05/14   Joseph Art, DO  buPROPion (WELLBUTRIN XL) 150 MG 24 hr tablet Take 1 tablet (150 mg total) by mouth every morning. 10/06/14   Newt Lukes, MD  cholecalciferol (VITAMIN D) 1000 UNITS tablet Take 1,000 Units by mouth daily.    Historical Provider, MD  clopidogrel (PLAVIX) 75 MG tablet Take 1 tablet (75 mg total) by mouth daily. 11/11/14   Newt Lukes, MD  collagenase (SANTYL) ointment Apply 1 application topically 4 (four) times daily. Patient not taking: Reported on 12/23/2014 10/13/14   Newt Lukes, MD  docusate sodium 100 MG CAPS Take 100 mg by mouth 2 (two) times daily. 07/16/14   Ripudeep Jenna Luo, MD  Ferrous Gluconate 256 (28 FE) MG TABS Take 256 mg by mouth daily.    Historical Provider, MD  furosemide (LASIX) 40 MG tablet Take 1 tablet (40 mg total) by mouth daily. Patient taking  differently: Take 40 mg by mouth 2 (two) times daily.  07/21/14   Elease Etienne, MD  LORazepam (ATIVAN) 1 MG tablet Take 1 tablet (1 mg total) by mouth every 8 (eight) hours as needed for anxiety or sleep. 09/10/14   Newt Lukes, MD  Magnesium 250 MG TABS Take  250 mg by mouth daily.     Historical Provider, MD  metoprolol succinate (TOPROL-XL) 50 MG 24 hr tablet Take 1 tablet (50 mg total) by mouth daily. Take with or immediately following a meal. 11/11/14   Newt Lukes, MD  mirabegron ER (MYRBETRIQ) 50 MG TB24 tablet Take 1 tablet (50 mg total) by mouth daily. 10/06/14   Newt Lukes, MD  mirtazapine (REMERON) 15 MG tablet Take 1 tablet (15 mg total) by mouth at bedtime. 09/10/14   Newt Lukes, MD  pantoprazole (PROTONIX) 40 MG tablet Take 1 tablet (40 mg total) by mouth 2 (two) times daily. 10/06/14   Newt Lukes, MD  pregabalin (LYRICA) 200 MG capsule Take 1 capsule (200 mg total) by mouth at bedtime. 12/15/14   Newt Lukes, MD  rOPINIRole (REQUIP) 4 MG tablet Take 1 tablet (4 mg total) by mouth at bedtime. 10/06/14   Newt Lukes, MD  sodium chloride (OCEAN) 0.65 % SOLN nasal spray Place 1 spray into both nostrils as needed for congestion.    Historical Provider, MD  tamsulosin (FLOMAX) 0.4 MG CAPS capsule Take 1 capsule (0.4 mg total) by mouth daily. 11/11/14   Newt Lukes, MD   Triage Vitals: BP 109/40 mmHg  Pulse 65  Temp(Src) 98.6 F (37 C) (Oral)  Resp 27  SpO2 90%   Physical Exam  Constitutional: He is oriented to person, place, and time. He appears well-developed and well-nourished.  HENT:  Head: Normocephalic and atraumatic.  Eyes: EOM are normal.  Neck: Normal range of motion.  Cardiovascular: Normal rate, regular rhythm and intact distal pulses.   Murmur heard. 5/6 ejection murmur noted  Pulmonary/Chest: Effort normal and breath sounds normal. No respiratory distress.  Abdominal: Soft. He exhibits distension. There is no  tenderness.  Decreased bowel sounds Tenderness to palpation over epigastrium   Musculoskeletal: Normal range of motion.  Bilateral pitting edema noted to lower extremities  Neurological: He is alert and oriented to person, place, and time.  Skin: Skin is warm and dry.  Psychiatric: He has a normal mood and affect. Judgment normal.  Nursing note and vitals reviewed.   ED Course  Procedures (including critical care time)  DIAGNOSTIC STUDIES: Oxygen Saturation is 90% on RA, low by my interpretation.    COORDINATION OF CARE: 1:30 AM- Will give Sublimaze and Zofran. Will order CXR, i-stat chem 8, i-stat CG4 lactic acid, BNP, CBC, CMP, Lipase, urinalysis, and Troponin I. Discussed treatment plan with pt at bedside and pt agreed to plan.     Labs Review Labs Reviewed  CBC WITH DIFFERENTIAL/PLATELET - Abnormal; Notable for the following:    Platelets 131 (*)    All other components within normal limits  COMPREHENSIVE METABOLIC PANEL - Abnormal; Notable for the following:    Chloride 100 (*)    Glucose, Bld 168 (*)    BUN 30 (*)    Creatinine, Ser 1.56 (*)    GFR calc non Af Amer 41 (*)    GFR calc Af Amer 47 (*)    All other components within normal limits  BRAIN NATRIURETIC PEPTIDE - Abnormal; Notable for the following:    B Natriuretic Peptide 168.2 (*)    All other components within normal limits  BLOOD GAS, ARTERIAL - Abnormal; Notable for the following:    pH, Arterial 7.297 (*)    pCO2 arterial 66.5 (*)    pO2, Arterial 62.5 (*)    Bicarbonate 31.6 (*)  Acid-Base Excess 5.4 (*)    All other components within normal limits  BLOOD GAS, ARTERIAL - Abnormal; Notable for the following:    pH, Arterial 7.286 (*)    pCO2 arterial 64.1 (*)    pO2, Arterial 61.9 (*)    Bicarbonate 29.5 (*)    Acid-Base Excess 3.4 (*)    All other components within normal limits  GLUCOSE, CAPILLARY - Abnormal; Notable for the following:    Glucose-Capillary 178 (*)    All other components  within normal limits  TROPONIN I - Abnormal; Notable for the following:    Troponin I 4.22 (*)    All other components within normal limits  TROPONIN I - Abnormal; Notable for the following:    Troponin I 17.13 (*)    All other components within normal limits  TROPONIN I - Abnormal; Notable for the following:    Troponin I 16.25 (*)    All other components within normal limits  BRAIN NATRIURETIC PEPTIDE - Abnormal; Notable for the following:    B Natriuretic Peptide 285.0 (*)    All other components within normal limits  BASIC METABOLIC PANEL - Abnormal; Notable for the following:    Chloride 100 (*)    Glucose, Bld 169 (*)    BUN 34 (*)    Creatinine, Ser 1.88 (*)    Calcium 8.2 (*)    GFR calc non Af Amer 32 (*)    GFR calc Af Amer 38 (*)    All other components within normal limits  URINALYSIS, ROUTINE W REFLEX MICROSCOPIC (NOT AT [facility]) - Abnormal; Notable for the following:    APPearance CLOUDY (*)    Hgb urine dipstick TRACE (*)    Leukocytes, UA SMALL (*)    All other components within normal limits  URINE MICROSCOPIC-ADD ON - Abnormal; Notable for the following:    Casts HYALINE CASTS (*)    All other components within normal limits  GLUCOSE, CAPILLARY - Abnormal; Notable for the following:    Glucose-Capillary 141 (*)    All other components within normal limits  HEPARIN LEVEL (UNFRACTIONATED) - Abnormal; Notable for the following:    Heparin Unfractionated 0.12 (*)    All other components within normal limits  GLUCOSE, CAPILLARY - Abnormal; Notable for the following:    Glucose-Capillary 112 (*)    All other components within normal limits  BASIC METABOLIC PANEL - Abnormal; Notable for the following:    Chloride 99 (*)    Glucose, Bld 165 (*)    BUN 28 (*)    Creatinine, Ser 1.64 (*)    Calcium 8.1 (*)    GFR calc non Af Amer 38 (*)    GFR calc Af Amer 44 (*)    All other components within normal limits  CBC - Abnormal; Notable for the following:    WBC 11.7  (*)    RDW 15.8 (*)    Platelets 125 (*)    All other components within normal limits  GLUCOSE, CAPILLARY - Abnormal; Notable for the following:    Glucose-Capillary 129 (*)    All other components within normal limits  HEPARIN LEVEL (UNFRACTIONATED) - Abnormal; Notable for the following:    Heparin Unfractionated 0.28 (*)    All other components within normal limits  GLUCOSE, CAPILLARY - Abnormal; Notable for the following:    Glucose-Capillary 134 (*)    All other components within normal limits  GLUCOSE, CAPILLARY - Abnormal; Notable for the following:  Glucose-Capillary 151 (*)    All other components within normal limits  GLUCOSE, CAPILLARY - Abnormal; Notable for the following:    Glucose-Capillary 147 (*)    All other components within normal limits  GLUCOSE, CAPILLARY - Abnormal; Notable for the following:    Glucose-Capillary 123 (*)    All other components within normal limits  I-STAT CG4 LACTIC ACID, ED - Abnormal; Notable for the following:    Lactic Acid, Venous 2.46 (*)    All other components within normal limits  I-STAT CHEM 8, ED - Abnormal; Notable for the following:    BUN 34 (*)    Creatinine, Ser 1.60 (*)    Glucose, Bld 164 (*)    All other components within normal limits  POCT I-STAT 3, ART BLOOD GAS (G3+) - Abnormal; Notable for the following:    pO2, Arterial 75.0 (*)    Bicarbonate 28.7 (*)    Acid-Base Excess 4.0 (*)    All other components within normal limits  MRSA PCR SCREENING  MRSA PCR SCREENING  CULTURE, RESPIRATORY (NON-EXPECTORATED)  URINE CULTURE  LIPASE, BLOOD  URINALYSIS, ROUTINE W REFLEX MICROSCOPIC (NOT AT [facility])  TROPONIN I  LACTIC ACID, PLASMA  PROCALCITONIN  PROCALCITONIN  AMMONIA  HEPARIN LEVEL (UNFRACTIONATED)    Imaging Review Ct Head Wo Contrast  01/10/2015   CLINICAL DATA:  Altered mental status.  EXAM: CT HEAD WITHOUT CONTRAST  TECHNIQUE: Contiguous axial images were obtained from the base of the skull through the  vertex without intravenous contrast.  COMPARISON:  07/13/2014.  FINDINGS: Diffusely enlarged ventricles and subarachnoid spaces. Patchy white matter low density in both cerebral hemispheres. Stable small, more focal area of low density in the left frontoparietal white matter. No intracranial hemorrhage, mass lesion or CT evidence of acute infarction. Unremarkable bones and included paranasal sinuses.  IMPRESSION: 1. No acute abnormality. 2. Stable moderate diffuse cerebral atrophy and mild chronic small vessel white matter ischemic changes in both cerebral hemispheres. 3. 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   Electronically Signed   By: Beckie Salts M.D.   On: 01/10/2015 12:34   Dg Chest Port 1 View  01/11/2015   CLINICAL DATA:  Acute respiratory failure  EXAM: PORTABLE CHEST - 1 VIEW  COMPARISON:  01/10/2015; 01/09/2015; 07/17/2014  FINDINGS: Grossly unchanged borderline enlarged cardiac silhouette and mediastinal contours given persistently reduced lung volumes and patient rotation. Stable position of support apparatus. No pneumothorax. Improved aeration of the left mid and right lower lung. Improved aeration of left lower lung with residual left basilar/retrocardiac heterogeneous/consolidative opacities. No new focal airspace opacities. Persistent blunting of left costophrenic potentially indicated of a trace left-sided effusion. Pulmonary vasculature remains indistinct. Unchanged bones.  IMPRESSION: 1.  Stable positioning of support apparatus.  No pneumothorax. 2. Overall improved aeration the lungs suggests resolving edema and/or atelectasis. 3. Residual left basilar/retrocardiac opacities, atelectasis versus infiltrate. Continued attention on follow-up is recommended.   Electronically Signed   By: Simonne Come M.D.   On: 01/11/2015 07:56   Dg Chest Port 1 View  01/10/2015   CLINICAL DATA:  Encounter for central line placement.  EXAM: PORTABLE CHEST - 1 VIEW  COMPARISON:  Same day.   FINDINGS: Stable cardiomediastinal silhouette. Endotracheal tube tip is 3.4 cm above the carina. Nasogastric tube is seen entering the stomach. No pneumothorax is noted. There is been interval placement of left internal jugular catheter line with distal tip in the expected position of the SVC. Minimal right basilar subsegmental atelectasis  is noted. Stable lingular and left lower lobe interstitial and airspace opacity is noted concerning for edema or pneumonia.  IMPRESSION: Lingular left lower lobe interstitial and airspace opacity concerning for edema or pneumonia. Endotracheal and nasogastric tubes are in grossly good position. Interval placement of left internal jugular catheter line with distal tip overlying expected position of SVC.   Electronically Signed   By: Lupita Raider, M.D.   On: 01/10/2015 09:51   Portable Chest Xray  01/10/2015   CLINICAL DATA:  Endotracheal tube  EXAM: PORTABLE CHEST - 1 VIEW  COMPARISON:  01/09/2015  FINDINGS: Endotracheal tube tip is 3.2 cm from the carina. NG tube tip is beyond the gastroesophageal junction. Diffuse bilateral hazy airspace opacities left greater than right. Cardiomegaly. No pneumothorax.  IMPRESSION: New bilateral airspace disease left greater than right.  Endotracheal and NG tubes have been placed.  Cardiomegaly.   Electronically Signed   By: Jolaine Click M.D.   On: 01/10/2015 08:24   Dg Abd Portable 1v  01/10/2015   CLINICAL DATA:  Orogastric tube placement  EXAM: PORTABLE ABDOMEN - 1 VIEW  COMPARISON:  CT 01/09/2015  FINDINGS: Orogastric tube tip terminates over the expected location of the distal stomach/duodenum bulb. Retained contrast within nondilated colon. No gaseous bowel distention.  IMPRESSION: Tip of orogastric tube over the expected location of the distal stomach/duodenum bulb.   Electronically Signed   By: Christiana Pellant M.D.   On: 01/10/2015 10:05     EKG Interpretation   Date/Time:  Saturday January 09 2015 00:47:40 EDT Ventricular  Rate:  56 PR Interval:  202 QRS Duration: 100 QT Interval:  386 QTC Calculation: 372 R Axis:   -33 Text Interpretation:  Sinus bradycardia with sinus arrhythmia Left axis  deviation Nonspecific ST and T wave abnormality Abnormal ECG Confirmed by  Osiel Stick  MD, Avanelle Pixley ([id number]) on 01/09/2015 12:59:49 AM      MDM   Final diagnoses:  SOB (shortness of breath)    81 year old male with acute on chronic shortness of breath, abdominal pain with nausea and vomiting.  Patient status post aortogram today.  He reports he has stent placed, but reading Dr. Adele Dan notes.  I do not see that a stent was placed.  Patient has pitting edema to knees bilaterally.  He has no crackles heard on exam, patient is to, but does not have any adventitial lung sounds.  His abdomen is distended, which may be decreasing his reserve capacity.  Plan for CT abdomen pelvis.  Expect admission given new oxygen requirement  I personally performed the services described in this documentation, which was scribed in my presence. The recorded information has been reviewed and is accurate.    Marisa Severin, MD 01/11/15 1400  Marisa Severin, MD 01/11/15 [id number]

## 2015-01-09 NOTE — ED Notes (Signed)
Vomiting since he came home

## 2015-01-09 NOTE — ED Notes (Signed)
Pt reminded about need for urine sample. Provided urinal.

## 2015-01-09 NOTE — Progress Notes (Signed)
PT demonstrated verbal and hands on understanding of Flutter device. 

## 2015-01-09 NOTE — Progress Notes (Signed)
Patient seen and examined. Admitted after midnight secondary to SOB and hypoxia. No signs of infiltrates on CXR. Patient stable and breathing ok after breathing treatment and O2 supplementation. Slight fluid overload on exam. Will continue lasix by mouth and will follow results of 2-D echo. Please referred to H&P written by Dr. Julian Reil for further info/details on exam.  Plan: -continue lasix -follow 2-D echo -continue oxygen supplementation -will follow clinical response -started on flutter valve     Vassie Loll [phone]

## 2015-01-09 NOTE — ED Notes (Signed)
Attempted report x1. 

## 2015-01-09 NOTE — ED Notes (Signed)
He is also c/o abd pain

## 2015-01-09 NOTE — ED Notes (Signed)
The pt just left this hospital  This am.  Since he went home he has had more sob.  He has had sob for 2 weeks.  He was here to get the circulation checked in his legs.  No resp distress at present

## 2015-01-10 ENCOUNTER — Inpatient Hospital Stay (HOSPITAL_COMMUNITY): Payer: Medicare Other

## 2015-01-10 DIAGNOSIS — N183 Chronic kidney disease, stage 3 (moderate): Secondary | ICD-10-CM

## 2015-01-10 DIAGNOSIS — R259 Unspecified abnormal involuntary movements: Secondary | ICD-10-CM | POA: Insufficient documentation

## 2015-01-10 DIAGNOSIS — R410 Disorientation, unspecified: Secondary | ICD-10-CM

## 2015-01-10 DIAGNOSIS — J9601 Acute respiratory failure with hypoxia: Secondary | ICD-10-CM

## 2015-01-10 DIAGNOSIS — R778 Other specified abnormalities of plasma proteins: Secondary | ICD-10-CM | POA: Insufficient documentation

## 2015-01-10 DIAGNOSIS — I359 Nonrheumatic aortic valve disorder, unspecified: Secondary | ICD-10-CM

## 2015-01-10 DIAGNOSIS — R4182 Altered mental status, unspecified: Secondary | ICD-10-CM | POA: Insufficient documentation

## 2015-01-10 DIAGNOSIS — R7989 Other specified abnormal findings of blood chemistry: Secondary | ICD-10-CM

## 2015-01-10 DIAGNOSIS — R579 Shock, unspecified: Secondary | ICD-10-CM

## 2015-01-10 LAB — URINALYSIS, ROUTINE W REFLEX MICROSCOPIC
Bilirubin Urine: NEGATIVE
Glucose, UA: NEGATIVE mg/dL
KETONES UR: NEGATIVE mg/dL
Nitrite: NEGATIVE
Protein, ur: NEGATIVE mg/dL
Specific Gravity, Urine: 1.016 (ref 1.005–1.030)
Urobilinogen, UA: 0.2 mg/dL (ref 0.0–1.0)
pH: 5 (ref 5.0–8.0)

## 2015-01-10 LAB — BLOOD GAS, ARTERIAL
Acid-Base Excess: 3.4 mmol/L — ABNORMAL HIGH (ref 0.0–2.0)
Bicarbonate: 29.5 mEq/L — ABNORMAL HIGH (ref 20.0–24.0)
Drawn by: 42624
FIO2: 100 %
O2 Saturation: 89.9 %
PH ART: 7.286 — AB (ref 7.350–7.450)
Patient temperature: 98.6
TCO2: 31.5 mmol/L (ref 0–100)
pCO2 arterial: 64.1 mmHg (ref 35.0–45.0)
pO2, Arterial: 61.9 mmHg — ABNORMAL LOW (ref 80.0–100.0)

## 2015-01-10 LAB — PROCALCITONIN: Procalcitonin: <0.1 ng/mL

## 2015-01-10 LAB — BASIC METABOLIC PANEL
Anion gap: 6 (ref 5–15)
BUN: 34 mg/dL — AB (ref 6–20)
CHLORIDE: 100 mmol/L — AB (ref 101–111)
CO2: 31 mmol/L (ref 22–32)
Calcium: 8.2 mg/dL — ABNORMAL LOW (ref 8.9–10.3)
Creatinine, Ser: 1.88 mg/dL — ABNORMAL HIGH (ref 0.61–1.24)
GFR calc Af Amer: 38 mL/min — ABNORMAL LOW (ref >60)
GFR, EST NON AFRICAN AMERICAN: 32 mL/min — AB (ref >60)
GLUCOSE: 169 mg/dL — AB (ref 65–99)
Potassium: 4.8 mmol/L (ref 3.5–5.1)
Sodium: 137 mmol/L (ref 135–145)

## 2015-01-10 LAB — POCT I-STAT 3, ART BLOOD GAS (G3+)
ACID-BASE EXCESS: 4 mmol/L — AB (ref 0.0–2.0)
Bicarbonate: 28.7 mEq/L — ABNORMAL HIGH (ref 20.0–24.0)
O2 SAT: 95 %
PCO2 ART: 44.4 mmHg (ref 35.0–45.0)
PH ART: 7.421 (ref 7.350–7.450)
Patient temperature: 99.4
TCO2: 30 mmol/L (ref 0–100)
pO2, Arterial: 75 mmHg — ABNORMAL LOW (ref 80.0–100.0)

## 2015-01-10 LAB — MRSA PCR SCREENING
MRSA BY PCR: NEGATIVE
MRSA by PCR: NEGATIVE

## 2015-01-10 LAB — URINE MICROSCOPIC-ADD ON

## 2015-01-10 LAB — LACTIC ACID, PLASMA: Lactic Acid, Venous: 2 mmol/L (ref 0.5–2.0)

## 2015-01-10 LAB — GLUCOSE, CAPILLARY
GLUCOSE-CAPILLARY: 141 mg/dL — AB (ref 65–99)
GLUCOSE-CAPILLARY: 112 mg/dL — AB (ref 65–99)
GLUCOSE-CAPILLARY: 129 mg/dL — AB (ref 65–99)
Glucose-Capillary: 178 mg/dL — ABNORMAL HIGH (ref 65–99)

## 2015-01-10 LAB — TROPONIN I
TROPONIN I: 4.22 ng/mL — AB (ref <0.031)
Troponin I: 17.13 ng/mL (ref <0.031)
Troponin I: 16.25 ng/mL (ref <0.031)

## 2015-01-10 LAB — HEPARIN LEVEL (UNFRACTIONATED): Heparin Unfractionated: 0.12 IU/mL — ABNORMAL LOW (ref 0.30–0.70)

## 2015-01-10 LAB — BRAIN NATRIURETIC PEPTIDE: B Natriuretic Peptide: 285 pg/mL — ABNORMAL HIGH (ref 0.0–100.0)

## 2015-01-10 MED ORDER — SODIUM CHLORIDE 0.9 % IV SOLN
25.0000 ug/h | INTRAVENOUS | Status: DC
  Administered 2015-01-10: 25 ug/h via INTRAVENOUS
  Administered 2015-01-11: 125 ug/h via INTRAVENOUS
  Administered 2015-01-11: 400 ug/h via INTRAVENOUS
  Administered 2015-01-11: 125 ug/h via INTRAVENOUS
  Administered 2015-01-12 (×3): 400 ug/h via INTRAVENOUS
  Administered 2015-01-13: 300 ug/h via INTRAVENOUS
  Administered 2015-01-13 (×2): 400 ug/h via INTRAVENOUS
  Administered 2015-01-14: 300 ug/h via INTRAVENOUS
  Administered 2015-01-14: 250 ug/h via INTRAVENOUS
  Administered 2015-01-15: 275 ug/h via INTRAVENOUS
  Administered 2015-01-15: 200 ug/h via INTRAVENOUS
  Administered 2015-01-16 – 2015-01-17 (×4): 250 ug/h via INTRAVENOUS
  Administered 2015-01-18: 100 ug/h via INTRAVENOUS
  Administered 2015-01-18: 250 ug/h via INTRAVENOUS
  Administered 2015-01-18: 100 ug/h via INTRAVENOUS
  Filled 2015-01-10 (×21): qty 50

## 2015-01-10 MED ORDER — FUROSEMIDE 10 MG/ML IJ SOLN: 40.0000 mg | Freq: Once | INTRAMUSCULAR | Status: DC

## 2015-01-10 MED ORDER — MIDAZOLAM HCL 2 MG/2ML IJ SOLN
1.0000 mg | INTRAMUSCULAR | Status: DC | PRN
  Administered 2015-01-11 (×2): 1 mg via INTRAVENOUS

## 2015-01-10 MED ORDER — FENTANYL BOLUS VIA INFUSION
25.0000 ug | INTRAVENOUS | Status: DC | PRN
  Administered 2015-01-13: 25 ug via INTRAVENOUS
  Filled 2015-01-10: qty 25

## 2015-01-10 MED ORDER — FENTANYL CITRATE (PF) 100 MCG/2ML IJ SOLN
INTRAMUSCULAR | Status: AC
  Administered 2015-01-10: 100 ug via INTRAVENOUS
  Filled 2015-01-10: qty 4

## 2015-01-10 MED ORDER — FENTANYL CITRATE (PF) 100 MCG/2ML IJ SOLN
50.0000 ug | Freq: Once | INTRAMUSCULAR | Status: AC
  Administered 2015-01-10: 50 ug via INTRAVENOUS

## 2015-01-10 MED ORDER — LORAZEPAM 2 MG/ML IJ SOLN
INTRAMUSCULAR | Status: AC
  Administered 2015-01-10: 2 mg
  Filled 2015-01-10: qty 1

## 2015-01-10 MED ORDER — FUROSEMIDE 10 MG/ML IJ SOLN: 60.0000 mg | Freq: Once | INTRAMUSCULAR | Status: DC

## 2015-01-10 MED ORDER — HEPARIN (PORCINE) IN NACL 100-0.45 UNIT/ML-% IJ SOLN
2450.0000 [IU]/h | INTRAMUSCULAR | Status: DC
  Administered 2015-01-10: 1300 [IU]/h via INTRAVENOUS
  Administered 2015-01-11: 1600 [IU]/h via INTRAVENOUS
  Administered 2015-01-12: 1950 [IU]/h via INTRAVENOUS
  Administered 2015-01-12 – 2015-01-14 (×4): 2150 [IU]/h via INTRAVENOUS
  Administered 2015-01-14: 2300 [IU]/h via INTRAVENOUS
  Administered 2015-01-15 – 2015-01-16 (×2): 2450 [IU]/h via INTRAVENOUS
  Filled 2015-01-10 (×22): qty 250

## 2015-01-10 MED ORDER — VANCOMYCIN HCL 10 G IV SOLR
2000.0000 mg | Freq: Once | INTRAVENOUS | Status: AC
  Administered 2015-01-10: 2000 mg via INTRAVENOUS
  Filled 2015-01-10: qty 2000

## 2015-01-10 MED ORDER — DOCUSATE SODIUM 50 MG/5ML PO LIQD
100.0000 mg | Freq: Two times a day (BID) | ORAL | Status: DC
  Administered 2015-01-10 – 2015-01-16 (×14): 100 mg
  Filled 2015-01-10 (×16): qty 10

## 2015-01-10 MED ORDER — CETYLPYRIDINIUM CHLORIDE 0.05 % MT LIQD
7.0000 mL | Freq: Four times a day (QID) | OROMUCOSAL | Status: DC
  Administered 2015-01-10 – 2015-01-26 (×62): 7 mL via OROMUCOSAL

## 2015-01-10 MED ORDER — HEPARIN BOLUS VIA INFUSION
4000.0000 [IU] | Freq: Once | INTRAVENOUS | Status: AC
  Administered 2015-01-10: 4000 [IU] via INTRAVENOUS
  Filled 2015-01-10: qty 4000

## 2015-01-10 MED ORDER — PIPERACILLIN-TAZOBACTAM 3.375 G IVPB
3.3750 g | Freq: Three times a day (TID) | INTRAVENOUS | Status: DC
Start: 2015-01-10 — End: 2015-01-13
  Administered 2015-01-10 – 2015-01-13 (×9): 3.375 g via INTRAVENOUS
  Filled 2015-01-10 (×10): qty 50

## 2015-01-10 MED ORDER — MIDAZOLAM HCL 2 MG/2ML IJ SOLN
1.0000 mg | INTRAMUSCULAR | Status: DC | PRN
  Filled 2015-01-10: qty 2

## 2015-01-10 MED ORDER — PHENYLEPHRINE HCL 10 MG/ML IJ SOLN
0.0000 ug/min | INTRAMUSCULAR | Status: DC
  Administered 2015-01-10: 100 ug/min via INTRAVENOUS
  Filled 2015-01-10: qty 1

## 2015-01-10 MED ORDER — PIPERACILLIN-TAZOBACTAM 3.375 G IVPB 30 MIN
3.3750 g | Freq: Once | INTRAVENOUS | Status: AC
  Administered 2015-01-10: 3.375 g via INTRAVENOUS
  Filled 2015-01-10: qty 50

## 2015-01-10 MED ORDER — PHENYLEPHRINE HCL 10 MG/ML IJ SOLN
0.0000 ug/min | INTRAVENOUS | Status: DC
  Administered 2015-01-10: 150 ug/min via INTRAVENOUS
  Administered 2015-01-10: 100 ug/min via INTRAVENOUS
  Administered 2015-01-10: 150 ug/min via INTRAVENOUS
  Administered 2015-01-11: 100 ug/min via INTRAVENOUS
  Administered 2015-01-11: 150 ug/min via INTRAVENOUS
  Administered 2015-01-11: 35 ug/min via INTRAVENOUS
  Filled 2015-01-10 (×7): qty 4

## 2015-01-10 MED ORDER — HEPARIN BOLUS VIA INFUSION
3000.0000 [IU] | Freq: Once | INTRAVENOUS | Status: AC
  Administered 2015-01-10: 3000 [IU] via INTRAVENOUS
  Filled 2015-01-10: qty 3000

## 2015-01-10 MED ORDER — PANTOPRAZOLE SODIUM 40 MG IV SOLR
40.0000 mg | Freq: Every day | INTRAVENOUS | Status: DC
  Administered 2015-01-10 – 2015-01-11 (×2): 40 mg via INTRAVENOUS
  Filled 2015-01-10 (×2): qty 40

## 2015-01-10 MED ORDER — MIDAZOLAM HCL 2 MG/2ML IJ SOLN
2.0000 mg | Freq: Once | INTRAMUSCULAR | Status: AC
  Administered 2015-01-10: 2 mg via INTRAVENOUS

## 2015-01-10 MED ORDER — ATROPINE SULFATE 0.1 MG/ML IJ SOLN
INTRAMUSCULAR | Status: AC
  Filled 2015-01-10: qty 10

## 2015-01-10 MED ORDER — SODIUM CHLORIDE 0.9 % IV BOLUS (SEPSIS)
500.0000 mL | Freq: Once | INTRAVENOUS | Status: AC
  Administered 2015-01-10: 500 mL via INTRAVENOUS

## 2015-01-10 MED ORDER — VITAL HIGH PROTEIN PO LIQD
1000.0000 mL | ORAL | Status: DC
  Administered 2015-01-10: 1000 mL
  Administered 2015-01-10 (×3)
  Administered 2015-01-11: 1000 mL
  Filled 2015-01-10 (×3): qty 1000

## 2015-01-10 MED ORDER — LORAZEPAM 2 MG/ML IJ SOLN
0.5000 mg | Freq: Once | INTRAMUSCULAR | Status: AC
  Administered 2015-01-10: 0.5 mg via INTRAVENOUS
  Filled 2015-01-10 (×2): qty 1

## 2015-01-10 MED ORDER — MIDAZOLAM HCL 2 MG/2ML IJ SOLN
INTRAMUSCULAR | Status: AC
  Administered 2015-01-10: 2 mg via INTRAVENOUS
  Filled 2015-01-10: qty 4

## 2015-01-10 MED ORDER — CHLORHEXIDINE GLUCONATE 0.12 % MT SOLN
15.0000 mL | Freq: Two times a day (BID) | OROMUCOSAL | Status: DC
  Administered 2015-01-10 – 2015-01-26 (×33): 15 mL via OROMUCOSAL
  Filled 2015-01-10 (×33): qty 15

## 2015-01-10 MED ORDER — SODIUM CHLORIDE 0.9 % IV SOLN
1250.0000 mg | INTRAVENOUS | Status: DC
  Administered 2015-01-11 – 2015-01-12 (×2): 1250 mg via INTRAVENOUS
  Filled 2015-01-10 (×3): qty 1250

## 2015-01-10 MED ORDER — ETOMIDATE 2 MG/ML IV SOLN
20.0000 mg | Freq: Once | INTRAVENOUS | Status: AC
  Administered 2015-01-10: 20 mg via INTRAVENOUS

## 2015-01-10 MED ORDER — FENTANYL CITRATE (PF) 100 MCG/2ML IJ SOLN
100.0000 ug | Freq: Once | INTRAMUSCULAR | Status: AC
Start: 2015-01-10 — End: 2015-01-10
  Administered 2015-01-10: 100 ug via INTRAVENOUS

## 2015-01-10 NOTE — Progress Notes (Signed)
Pt having SOB, earlier pt was on 6L started having sats lower than 90, put pt on Venturi mask at 31% sats 91, but would not stay in the 90's changed to 45% sats high as 88%, called MD to get breathing tx ordered and saline nose spray due to congestion, gave Albuterol tx and nose spray, did help but still SOB, called RT put pt on nonrebreather sats in 93-94, still SOB called rapid reponse to look at pt, will continue to monitor, thanks Lavonda Jumbo RN

## 2015-01-10 NOTE — Progress Notes (Signed)
Called to 2M11 for coude catheter insertion.  Perineal care pre insertion.  Penis retracted into scrotum and hard to retract foreskin.  88F.Coude catheter inserted without difficulty, 16ml ballon.  Urimeter bag utilized with immediate urine return of 225 cc.

## 2015-01-10 NOTE — Progress Notes (Signed)
ANTICOAGULATION CONSULT NOTE - Follow-up Consult  Pharmacy Consult for Heparin Indication: chest pain/ACS  Allergies  Allergen Reactions  . Statins Other (See Comments)    Severe leg myalgias, weakness    Patient Measurements: Height: 5\' 9"  (175.3 cm) Weight: 250 lb 3.6 oz (113.5 kg) IBW/kg (Calculated) : 70.7 Heparin Dosing Weight: 96 kg  Vital Signs: Temp: 101.9 F (38.8 C) (06/19 1942) Temp Source: Oral (06/19 1942) BP: 105/32 mmHg (06/19 2300) Pulse Rate: 69 (06/19 2323)  Labs:  Recent Labs  01/08/15 0624 01/09/15 0103 01/09/15 0121 01/10/15 1100 01/10/15 1519 01/10/15 2240  HGB 17.0 15.2 17.0  --   --   --   HCT 50.0 46.6 50.0  --   --   --   PLT  --  131*  --   --   --   --   HEPARINUNFRC  --   --   --   --   --  0.12*  CREATININE 1.70* 1.56* 1.60* 1.88*  --   --   TROPONINI  --  0.03  --  4.22* 17.13*  --     Estimated Creatinine Clearance: 39.6 mL/min (by C-G formula based on Cr of 1.88).  Assessment: 81yo male on heparin for ACS/chest pain. Trop up to 17.13. Heparin level subtherapeutic (0.12) on 1300 units/hr. No issues with line or bleeding per RN.   Goal of Therapy:  Heparin level 0.3-0.7 units/ml Monitor platelets by anticoagulation protocol: Yes   Plan:  Rebolus heparin 3000 units Increase heparin gtt at 1600 units/hr Will f/u 8 hr heparin level  Christoper Fabian, PharmD, BCPS Clinical pharmacist, pager [phone] 01/10/2015,11:37 PM

## 2015-01-10 NOTE — Progress Notes (Signed)
[facility] progress note. Chief complaint. Dyspnea, desats. This 81 year old male admitted with chief complaint of shortness of breath. Diagnosed with acute respiratory failure and hypoxemia. Admitting chest x-ray was clear. The patient had desats overnight and he was transitioned to step down unit and placed on BiPAP. Arterial blood gas prior to BiPAP pH 7.297, PCO2 in the 60s, PO2 62.5, bicarbonate 31.6. The patient has since become confused and agitated pulling off the BiPAP mask. Wrist restraints were required [place] BiPAP was discontinued. The patient was placed on full facemask oxygen. Repeat ABG post discontinuance of BiPAP quite similar pH 7.286, PCO2 64.1. The patient didn't is much too confused for review of systems. There is obvious dyspnea and his respiratory rate is currently in the 30s on nonrebreather mask. He also seems much more confused than his normal baseline. Physical exam. Vital signs. Temperature 90.9, pulse 81, respiration 30, blood pressure 125/43. O2 sats 93%. This on facemask nonrebreather. General appearance. Well-developed elderly male who is confused and agitated. Cardiac. Rate and rhythm regular. No jugular venous distention. There is some mild pedal edema. Lungs. Breath sounds are reduced in the bases with some scattered crackles appreciated. Dyspnea evident with increased respiratory rate. O2 sats remained in the low 90s on full facemask oxygen. Abdomen. Soft and obese with positive bowel sounds. Impression/plan. Problem #1. Acute respiratory failure with hypoxia. The patient has failed BiPAP based on increased agitation and ABG result. I discussed the case with the patient's daughter by phone who indicated she would wish the patient intubated if needed. I then discussed the case with critical-care who will now transfer the patient to ICU and proceed with intubation based on current patient condition. We'll follow for critical-care's recommendations. I appreciate them  accepting this consult.

## 2015-01-10 NOTE — Progress Notes (Signed)
   01/10/15 0230  BiPAP/CPAP/SIPAP  BiPAP/CPAP/SIPAP Pt Type Adult  Mask Type Full face mask  Mask Size Large  Set Rate 8 breaths/min  Respiratory Rate 33 breaths/min  IPAP 14 cmH20  EPAP 6 cmH2O  Oxygen Percent 60 %  Minute Ventilation 11  Leak 22  Peak Inspiratory Pressure (PIP) 15  Tidal Volume (Vt) 521  BiPAP/CPAP/SIPAP BiPAP  Patient Home Equipment No  Auto Titrate No  BiPAP/CPAP /SiPAP Vitals  Pulse Rate 90  Resp (!) 33  SpO2 98 %  Patient transfer to 2C from 3E as a Rapid Response due to hypoxemia and increase WOB. Patient placed on BIPAP above settings.

## 2015-01-10 NOTE — Progress Notes (Signed)
Neurology consult called for acute encephalopathy and concerns for possible seizure activity (tongue jerking noted).  CT head assessed and negative for acute process.  EEG pending.  Troponin elevated at 4.2 without acute EKG changes.  Cardiology consulted for possible ACS / infarct.  Discussed case with Dr. Edilia Bo regarding heparin administration and he indicates no contraindications to anticoagulation from VVS standpoint with recent surgery / arteriography of RLE.     Plan:  Begin heparin gtt per pharmacy Follow further troponin  Appreciate Neurology / Cardiology input Family updated on findings   Canary Brim, NP-C [facility] Pgr: [phone] or [phone]

## 2015-01-10 NOTE — Progress Notes (Signed)
**Note De-identified  Obfuscation** Sputum collected and sent to lab 

## 2015-01-10 NOTE — Progress Notes (Addendum)
Brief Cardiology X-Cover Note  Paged with trop rise from 4 --> 17. Mr. [name] was seen this afternoon by Dr. Graciela Husbands. He has had a respiratory arrest 2/2 hypercarbia. His hemodynamics require neosynephrine at modest doses but no change in doses or escalation. His echo was yesterday with EF preserved but no moderate AS with mean gradient 32 mmHg. He is on heparin gtt. Given renal failure and respiratory failure without change in hemodynamics, it appears best course is continued heparin gtt, repeat echocardiogram and continue aggressive mechanical respiratory support and medical support. If hemodynamics change, low threshold to re-evaluate to consider coronary evaluation versus other therapies.   Leeann Must, MD  Patient examined ~ 19:00-19:15. Vitals stable on neosynephrine, BP 106/28 mmHg  Pulse 80  Temp(Src) 100 F (37.8 C) (Oral)  Resp 18  Ht 5\' 9"  (1.753 m)  Wt 113.5 kg (250 lb 3.6 oz)  BMI 36.93 kg/m2  SpO2 95%He in intubated/sedated, normal rhythm, HR 80s, late peaking systolic murmur heard throughout precordium, extremities warm, trace edema. EKG reviewed with SR, LAD, lateral ST depression. Given concomitant respiratory failure/PNA, prefer medical stabilization before coronary angiogram. If hemodynamics change otherwise, can re-evaluate urgent cardiac catheterization. Low threshold to repeat ECG.   Leeann Must, MD

## 2015-01-10 NOTE — Progress Notes (Signed)
ANTIBIOTIC CONSULT NOTE - INITIAL  Pharmacy Consult for Vancomycin and Zosyn Indication: rule out pneumonia  Allergies  Allergen Reactions  . Statins Other (See Comments)    Severe leg myalgias, weakness    Patient Measurements: Height:  (175.3 cm) Weight: 250 lb 3.6 oz (113.5 kg) IBW/kg (Calculated) : 70.7 Adjusted Body Weight:   Vital Signs: Temp: 99.4 F (37.4 C) (06/19 0817) Temp Source: Oral (06/19 0900) BP: 108/43 mmHg (06/19 0900) Pulse Rate: 48 (06/19 0900) Intake/Output from previous day: 06/18 0701 - 06/19 0700 In: 720 [P.O.:720] Out: 1120 [Urine:1120] Intake/Output from this shift: Total I/O In: 47.5 [I.V.:47.5] Out: -   Labs:  Recent Labs  01/08/15 0624 01/09/15 0103 01/09/15 0121  WBC  --  9.7  --   HGB 17.0 15.2 17.0  PLT  --  131*  --   CREATININE 1.70* 1.56* 1.60*   Estimated Creatinine Clearance: 46.5 mL/min (by C-G formula based on Cr of 1.6). No results for input(s): VANCOTROUGH, VANCOPEAK, VANCORANDOM, GENTTROUGH, GENTPEAK, GENTRANDOM, TOBRATROUGH, TOBRAPEAK, TOBRARND, AMIKACINPEAK, AMIKACINTROU, AMIKACIN in the last 72 hours.   Microbiology: Recent Results (from the past 720 hour(s))  MRSA PCR Screening     Status: None   Collection Time: 01/10/15  2:31 AM  Result Value Ref Range Status   MRSA by PCR NEGATIVE NEGATIVE Final    Comment:        The GeneXpert MRSA Assay (FDA approved for NASAL specimens only), is one component of a comprehensive MRSA colonization surveillance program. It is not intended to diagnose MRSA infection nor to guide or monitor treatment for MRSA infections.     Medical History: Past Medical History  Diagnosis Date  . Gout, unspecified     severe dz, "treatment done that last 15 years"  . Obesity (BMI 30-39.9)   . Restless leg syndrome   . History of retinal detachment 1994  . Hypertensive heart disease   . CAD (coronary artery disease), native coronary artery   . History of pericarditis 2007     MSSA, s/p pericardial window  . GERD   . Osteoarthritis   . Sleep apnea in adult     CPAP qhs  . TIA (transient ischemic attack) 2005  . BPH (benign prostatic hypertrophy)     "minor"  . Lumbar spinal stenosis   . Carotid artery occlusion   . Unspecified venous (peripheral) insufficiency   . Stroke May 2003  . Anxiety   . Depression   . Aortic stenosis     ECHO 05/29/14 moderate AS Mean gradient 21 mm EF 55% ECHO 07/09/13  Mean gradient 12 mm Mild to moderate AS EF 50%    . CAD (coronary artery disease)     02/11/09  Xience stent to circumflex 2.5 x 18 mm postdilated to 2.75 mm  Cath sept 2011  normal Left main, 20 % stenosis proximal LAD, 40% stenosis mid LAD, 70% stenosis proximal Diag 1, 90% stenosis prox CFX, 90% first OM, small and nondominant RCA;   . Carotid artery disease     Prior TIA 2006 with left CEA by Dr. Arbie Cookey Recurrent TIA in April 2011   . Type 2 diabetes mellitus with vascular disease   . Lumbar disc disease   . CKD (chronic kidney disease), stage III 02/08/2014  . Hypertension   . CHF (congestive heart failure)   . Renal insufficiency     Medications:  Prescriptions prior to admission  Medication Sig Dispense Refill Last Dose  . acetaminophen (TYLENOL)  325 MG tablet Take 2 tablets (650 mg total) by mouth every 6 (six) hours as needed for mild pain or moderate pain. (Patient taking differently: Take 325 mg by mouth every 6 (six) hours as needed for mild pain or moderate pain. )   01/08/2015 at Unknown time  . allopurinol (ZYLOPRIM) 300 MG tablet Take 1 tablet (300 mg total) by mouth every morning. 90 tablet 1 01/08/2015 at Unknown time  . amLODipine (NORVASC) 5 MG tablet Take 5 mg by mouth 2 (two) times daily.   01/08/2015 at Unknown time  . aspirin EC 81 MG tablet Take 81 mg by mouth daily.   01/08/2015 at Unknown time  . buPROPion (WELLBUTRIN XL) 150 MG 24 hr tablet Take 1 tablet (150 mg total) by mouth every morning. 90 tablet 1 01/08/2015 at Unknown time  .  cholecalciferol (VITAMIN D) 1000 UNITS tablet Take 1,000 Units by mouth daily.   01/08/2015 at Unknown time  . clopidogrel (PLAVIX) 75 MG tablet Take 1 tablet (75 mg total) by mouth daily. 90 tablet 3 01/08/2015 at Unknown time  . docusate sodium 100 MG CAPS Take 100 mg by mouth 2 (two) times daily. 10 capsule 0 01/08/2015 at Unknown time  . Ferrous Gluconate 256 (28 FE) MG TABS Take 256 mg by mouth daily.   01/08/2015 at Unknown time  . furosemide (LASIX) 40 MG tablet Take 1 tablet (40 mg total) by mouth daily. (Patient taking differently: Take 40 mg by mouth 2 (two) times daily. )   01/08/2015 at Unknown time  . LORazepam (ATIVAN) 1 MG tablet Take 1 tablet (1 mg total) by mouth every 8 (eight) hours as needed for anxiety or sleep. 90 tablet 3 01/08/2015 at Unknown time  . Magnesium 250 MG TABS Take 250 mg by mouth daily.    01/08/2015 at Unknown time  . metoprolol succinate (TOPROL-XL) 50 MG 24 hr tablet Take 1 tablet (50 mg total) by mouth daily. Take with or immediately following a meal. 90 tablet 3 01/08/2015 at 0800  . mirabegron ER (MYRBETRIQ) 50 MG TB24 tablet Take 1 tablet (50 mg total) by mouth daily. 90 tablet 1 01/08/2015 at Unknown time  . mirtazapine (REMERON) 15 MG tablet Take 1 tablet (15 mg total) by mouth at bedtime. 30 tablet 11 01/08/2015 at Unknown time  . pantoprazole (PROTONIX) 40 MG tablet Take 1 tablet (40 mg total) by mouth 2 (two) times daily. 90 tablet 1 01/08/2015 at Unknown time  . pregabalin (LYRICA) 200 MG capsule Take 1 capsule (200 mg total) by mouth at bedtime. 90 capsule 1 01/08/2015 at Unknown time  . rOPINIRole (REQUIP) 4 MG tablet Take 1 tablet (4 mg total) by mouth at bedtime. 90 tablet 1 01/08/2015 at Unknown time  . sodium chloride (OCEAN) 0.65 % SOLN nasal spray Place 1 spray into both nostrils as needed for congestion.   01/08/2015 at Unknown time  . tamsulosin (FLOMAX) 0.4 MG CAPS capsule Take 1 capsule (0.4 mg total) by mouth daily. 90 capsule 3 01/08/2015 at Unknown time    Scheduled:  . allopurinol  300 mg Oral q morning - 10a  . amLODipine  5 mg Oral BID  . antiseptic oral rinse  7 mL Mouth Rinse QID  . aspirin EC  81 mg Oral Daily  . atropine      . buPROPion  150 mg Oral q morning - 10a  . chlorhexidine  15 mL Mouth Rinse BID  . clopidogrel  75 mg Oral Daily  .  docusate sodium  100 mg Oral BID  . furosemide  60 mg Intravenous Once  . heparin  5,000 Units Subcutaneous 3 times per day  . magnesium oxide  400 mg Oral Daily  . metoprolol succinate  50 mg Oral Daily  . mirabegron ER  50 mg Oral Daily  . mirtazapine  15 mg Oral QHS  . pantoprazole (PROTONIX) IV  40 mg Intravenous Daily  . piperacillin-tazobactam  3.375 g Intravenous Once  . pregabalin  200 mg Oral QHS  . rOPINIRole  4 mg Oral QHS  . tamsulosin  0.4 mg Oral Daily  . vancomycin  2,000 mg Intravenous Once   Infusions:  . fentaNYL infusion INTRAVENOUS Stopped (01/10/15 0800)  . phenylephrine (NEO-SYNEPHRINE) Adult infusion 100 mcg/min (01/10/15 0841)   Assessment: 81yo male with extensive PMH and recent arteriography of RLE for venous ulcers presents with AMS and acute respiratory failure. Pharmacy is consulted to dose vancomycin and zosyn for suspected pneumonia. Pt is afebrile, WBC wnl, sCr 1.6, LA 2.5.  Goal of Therapy:  Vancomycin trough level 15-20 mcg/ml  Plan:  Vancomycin 2g IV load followed by  q24h Zosyn 3.375g IV q8h Expected duration 7 days with resolution of temperature and/or normalization of WBC Measure antibiotic drug levels at steady state Follow up culture results, renal function and clinical course  Arlean Hopping. Newman Pies, PharmD Clinical Pharmacist Pager [phone] 01/10/2015,9:27 AM

## 2015-01-10 NOTE — Consult Note (Signed)
ELECTROPHYSIOLOGY CONSULT NOTE  Patient ID: [name], MRN: [id number], DOB/AGE: 79-Apr-1934 79 y.o. Admit date: 01/09/2015 Date of Consult: 01/10/2015  Primary Physician: [name], MD Primary Cardiologist: wst  Chief Complaint: +troponin  HPI [name] is a 81 y.o. male  Was admitted 6/18 with complaints of shortness of breath that had been progressive over 2 weeks aggravated by exertion. There have been bilateral edema  He apparently also been having some abdominal pain and vomiting. Treated by the hospitalists with BiPAP and oxygen which failed and he is also required intubation. Blood gases were consistent with hypercarbic respiratory failure  He has also been profoundly hypotensive and to some degree bradycardic. He is currently being supported with Neo-Synephrine  He has had low-grade fever-MAXIMUM TEMPERATURE 38.2. 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 He underwent drug-eluting stenting to circumflex 2010; there is apparently also a repeat catheterization 9/11 presumably showing patent arteries Echocardiogram 11/15 demonstrated normal LV function and mild aortic stenosis.  Past medical history is also notable for hypertension, type 2 diabetes HFpEF and peripheral vascular disease He has a history of carotid endarterectomy 2006 and most recently was seen by Dr. Durwin Nora with a nonhealing wound on his right metatarsal. There was concern about occlusive peripheral vascular disease and he underwent angiography 6/17. My read of the notes suggests that there is no significant vascular disease in the lower extremity.   Past Medical History  Diagnosis Date  . Gout, unspecified     severe dz, "treatment done that last 15 years"  . Obesity (BMI 30-39.9)   . Restless leg syndrome   . History of retinal detachment 1994  . Hypertensive heart  disease   . CAD (coronary artery disease), native coronary artery   . History of pericarditis 2007    MSSA, s/p pericardial window  . GERD   . Osteoarthritis   . Sleep apnea in adult     CPAP qhs  . TIA (transient ischemic attack) 2005  . BPH (benign prostatic hypertrophy)     "minor"  . Lumbar spinal stenosis   . Carotid artery occlusion   . Unspecified venous (peripheral) insufficiency   . Stroke May 2003  . Anxiety   . Depression   . Aortic stenosis     ECHO 05/29/14 moderate AS Mean gradient 21 mm EF 55% ECHO 07/09/13  Mean gradient 12 mm Mild to moderate AS EF 50%    . CAD (coronary artery disease)     02/11/09  Xience stent to circumflex 2.5 x 18 mm postdilated to 2.75 mm  Cath sept 2011  normal Left main, 20 % stenosis proximal LAD, 40% stenosis mid LAD, 70% stenosis proximal Diag 1, 90% stenosis prox CFX, 90% first OM, small and nondominant RCA;   . Carotid artery disease     Prior TIA 2006 with left CEA by Dr. Arbie Cookey Recurrent TIA in April 2011   . Type 2 diabetes mellitus with vascular disease   . Lumbar disc disease   . CKD (chronic kidney disease), stage III 02/08/2014  . Hypertension   . CHF (congestive heart failure)   . Renal insufficiency       Surgical History:  Past Surgical History  Procedure Laterality Date  . Pericardial window  2007  . Left arm  2008    shoulder  . Right arm  1970's    shoulder  .  Cataract extraction      Left side x's 2 and right  . Retinal detachment surgery      left side  . Carotid endarterectomy Left 12-06-05    cea  . Coronary angioplasty  01/2009    x 1 stent  . Colonoscopy    . Carpal tunnel release Bilateral   . Back surgery  2013    removed bone spurs  . Inguinal hernia repair Left 10/01/2013    Procedure: HERNIA REPAIR INGUINAL ADULT;  Surgeon: Axel Filler, MD;  Location: [facility];  Service: General;  Laterality: Left;  . Insertion of mesh Left 10/01/2013    Procedure: INSERTION OF MESH;  Surgeon: Axel Filler, MD;   Location: [facility];  Service: General;  Laterality: Left;  . Finger amputation  May 2015    JUST TO FIRST JOINT RIGHT HAND  LAST FINGER ( Right 5th finger)     Home Meds: Prior to Admission medications   Medication Sig Start Date End Date Taking? Authorizing Provider  acetaminophen (TYLENOL) 325 MG tablet Take 2 tablets (650 mg total) by mouth every 6 (six) hours as needed for mild pain or moderate pain. Patient taking differently: Take 325 mg by mouth every 6 (six) hours as needed for mild pain or moderate pain.  07/21/14  Yes Elease Etienne, MD  allopurinol (ZYLOPRIM) 300 MG tablet Take 1 tablet (300 mg total) by mouth every morning. 10/06/14  Yes Newt Lukes, MD  amLODipine (NORVASC) 5 MG tablet Take 5 mg by mouth 2 (two) times daily. 11/18/14  Yes Historical Provider, MD  aspirin EC 81 MG tablet Take 81 mg by mouth daily.   Yes Historical Provider, MD  buPROPion (WELLBUTRIN XL) 150 MG 24 hr tablet Take 1 tablet (150 mg total) by mouth every morning. 10/06/14  Yes Newt Lukes, MD  cholecalciferol (VITAMIN D) 1000 UNITS tablet Take 1,000 Units by mouth daily.   Yes Historical Provider, MD  clopidogrel (PLAVIX) 75 MG tablet Take 1 tablet (75 mg total) by mouth daily. 11/11/14  Yes Newt Lukes, MD  docusate sodium 100 MG CAPS Take 100 mg by mouth 2 (two) times daily. 07/16/14  Yes Ripudeep Jenna Luo, MD  Ferrous Gluconate 256 (28 FE) MG TABS Take 256 mg by mouth daily.   Yes Historical Provider, MD  furosemide (LASIX) 40 MG tablet Take 1 tablet (40 mg total) by mouth daily. Patient taking differently: Take 40 mg by mouth 2 (two) times daily.  07/21/14  Yes Elease Etienne, MD  LORazepam (ATIVAN) 1 MG tablet Take 1 tablet (1 mg total) by mouth every 8 (eight) hours as needed for anxiety or sleep. 09/10/14  Yes Newt Lukes, MD  Magnesium 250 MG TABS Take 250 mg by mouth daily.    Yes Historical Provider, MD  metoprolol succinate (TOPROL-XL) 50 MG 24 hr tablet Take 1 tablet (50  mg total) by mouth daily. Take with or immediately following a meal. 11/11/14  Yes Newt Lukes, MD  mirabegron ER (MYRBETRIQ) 50 MG TB24 tablet Take 1 tablet (50 mg total) by mouth daily. 10/06/14  Yes Newt Lukes, MD  mirtazapine (REMERON) 15 MG tablet Take 1 tablet (15 mg total) by mouth at bedtime. 09/10/14  Yes Newt Lukes, MD  pantoprazole (PROTONIX) 40 MG tablet Take 1 tablet (40 mg total) by mouth 2 (two) times daily. 10/06/14  Yes Newt Lukes, MD  pregabalin (LYRICA) 200 MG capsule Take 1 capsule (200 mg  total) by mouth at bedtime. 12/15/14  Yes Newt Lukes, MD  rOPINIRole (REQUIP) 4 MG tablet Take 1 tablet (4 mg total) by mouth at bedtime. 10/06/14  Yes Newt Lukes, MD  sodium chloride (OCEAN) 0.65 % SOLN nasal spray Place 1 spray into both nostrils as needed for congestion.   Yes Historical Provider, MD  tamsulosin (FLOMAX) 0.4 MG CAPS capsule Take 1 capsule (0.4 mg total) by mouth daily. 11/11/14  Yes Newt Lukes, MD    Inpatient Medications:  . allopurinol  300 mg Oral q morning - 10a  . antiseptic oral rinse  7 mL Mouth Rinse QID  . aspirin EC  81 mg Oral Daily  . atropine      . buPROPion  150 mg Oral q morning - 10a  . chlorhexidine  15 mL Mouth Rinse BID  . clopidogrel  75 mg Oral Daily  . docusate  100 mg Per Tube BID  . feeding supplement (VITAL HIGH PROTEIN)  1,000 mL Per Tube Q24H  . furosemide  60 mg Intravenous Once  . heparin  4,000 Units Intravenous Once  . magnesium oxide  400 mg Oral Daily  . mirabegron ER  50 mg Oral Daily  . pantoprazole (PROTONIX) IV  40 mg Intravenous Daily  . piperacillin-tazobactam (ZOSYN)  IV  3.375 g Intravenous Q8H  . pregabalin  200 mg Oral QHS  . rOPINIRole  4 mg Oral QHS  . tamsulosin  0.4 mg Oral Daily  . [START ON 01/11/2015] vancomycin  1,250 mg Intravenous Q24H     Allergies:  Allergies  Allergen Reactions  . Statins Other (See Comments)    Severe leg myalgias, weakness     History   Social History  . Marital Status: Widowed    Spouse Name: N/A  . Number of Children: N/A  . Years of Education: N/A   Occupational History  . Not on file.   Social History Main Topics  . Smoking status: Never Smoker   . Smokeless tobacco: Never Used  . Alcohol Use: No  . Drug Use: No  . Sexual Activity: Not on file   Other Topics Concern  . Not on file   Social History Narrative   Lives alone      Family History  Problem Relation Age of Onset  . Heart disease Mother     Before age 54  . Heart attack Mother   . Diabetes Father   . Heart disease Father   . Breast cancer Sister   . Cancer Sister   . Prostate cancer Brother   . Lung cancer Brother   . Cancer Brother   . Diabetes Daughter   . Diabetes Son   . Hypertension Son      ROS:  Please see the history of present illness only available as in the chart as the patient is intubated  All other systems reviewed and negative.    Physical Exam:   Blood pressure 116/36, pulse 68, temperature 100.8 F (38.2 C), temperature source Oral, resp. rate 18, height 5\' 9"  (1.753 m), weight 250 lb 3.6 oz (113.5 kg), SpO2 97 %. General: Well developed, morbidly intubated Caucasian age appearing male  Head: Normocephalic, atraumatic, sclera non-icteric, no xanthomas, nares are without discharge. EENT: normal Lymph Nodes:  none Back:  Not able to be examined Neck: Negative for carotid bruits. Not discernible Lungs: Decreased air movement bilaterally Heart: RRR with S1 S2.  3/6 systolic murmur , rubs, or gallops appreciated. Abdomen:  Soft, non-tender, non-distended with normoactive bowel sounds. No hepatomegaly. No rebound/guarding. No obvious abdominal masses. Msk:  Strength and tone appear normal for age. Extremities: No clubbing or cyanosis.  1+ edema.  Distal pedal pulses are 2+ and equal bilaterally. Bandage r foot Skin: Warm and Dry Neuro: Intubated and unresponsive with some nausea motions and tongue  protrusion         Labs: Cardiac Enzymes  Recent Labs  01/09/15 0103 01/10/15 1100  TROPONINI 0.03 4.22*   CBC Lab Results  Component Value Date   WBC 9.7 01/09/2015   HGB 17.0 01/09/2015   HCT 50.0 01/09/2015   MCV 91.0 01/09/2015   PLT 131* 01/09/2015   PROTIME: No results for input(s): LABPROT, INR in the last 72 hours. Chemistry  Recent Labs Lab 01/09/15 0103  01/10/15 1100  NA 137  < > 137  K 4.5  < > 4.8  CL 100*  < > 100*  CO2 28  --  31  BUN 30*  < > 34*  CREATININE 1.56*  < > 1.88*  CALCIUM 9.1  --  8.2*  PROT 7.3  --   --   BILITOT 0.7  --   --   ALKPHOS 100  --   --   ALT 17  --   --   AST 26  --   --   GLUCOSE 168*  < > 169*  < > = values in this interval not displayed. Lipids Lab Results  Component Value Date   CHOL 190 07/15/2014   HDL 32* 07/15/2014   LDLCALC 128* 07/15/2014   TRIG 149 07/15/2014   BNP PRO B NATRIURETIC PEPTIDE (BNP)  Date/Time Value Ref Range Status  07/13/2014 07:28 PM 814.6* 0 - 450 pg/mL Final  07/10/2014 08:01 AM 422.7 0 - 450 pg/mL Final  05/28/2014 03:08 PM 477.3* 0 - 450 pg/mL Final  03/20/2014 08:00 AM 762.3* 0 - 450 pg/mL Final   Thyroid Function Tests: No results for input(s): TSH, T4TOTAL, T3FREE, THYROIDAB in the last 72 hours.  Invalid input(s): FREET3    Miscellaneous Lab Results  Component Value Date   DDIMER 0.86* 07/17/2014    Radiology/Studies:  Dg Chest 2 View  01/09/2015   CLINICAL DATA:  81 year old male with shortness of Breath, recent hospitalization for lower extremity swelling and shortness of Breath. Subsequent encounter.  EXAM: CHEST  2 VIEW  COMPARISON:  07/17/2014 and earlier.  FINDINGS: Semi upright AP and lateral views of the chest. Stable cardiomegaly and mediastinal contours. No pneumothorax, pulmonary edema, pleural effusion or confluent pulmonary opacity. No acute osseous abnormality identified.  IMPRESSION: No acute cardiopulmonary abnormality.   Electronically Signed   By: Odessa Fleming M.D.   On: 01/09/2015 01:26   Ct Head Wo Contrast  01/10/2015   CLINICAL DATA:  Altered mental status.  EXAM: CT HEAD WITHOUT CONTRAST  TECHNIQUE: Contiguous axial images were obtained from the base of the skull through the vertex without intravenous contrast.  COMPARISON:  07/13/2014.  FINDINGS: Diffusely enlarged ventricles and subarachnoid spaces. Patchy white matter low density in both cerebral hemispheres. Stable small, more focal area of low density in the left frontoparietal white matter. No intracranial hemorrhage, mass lesion or CT evidence of acute infarction. Unremarkable bones and included paranasal sinuses.  IMPRESSION: 1. No acute abnormality. 2. Stable moderate diffuse cerebral atrophy and mild chronic small vessel white matter ischemic changes in both cerebral hemispheres. 3. 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   Electronically  Signed   By: Beckie Salts M.D.   On: 01/10/2015 12:34   Ct Abdomen Pelvis W Contrast  01/09/2015   CLINICAL DATA:  Intermittent ongoing shortness of breath for 2 weeks, worse today. Abdominal pain for a few days with nausea. Recent discharge from hospital early this morning after stent placed in the lower extremity.  EXAM: CT ABDOMEN AND PELVIS WITH CONTRAST  TECHNIQUE: Multidetector CT imaging of the abdomen and pelvis was performed using the standard protocol following bolus administration of intravenous contrast.  CONTRAST:  72mL OMNIPAQUE IOHEXOL 300 MG/ML  SOLN  COMPARISON:  None.  FINDINGS: Atelectasis in the lung bases. Coronary artery and aortic valve calcifications.  Mild diffuse fatty infiltration of the liver. No focal liver lesions. The gallbladder, spleen, pancreas, adrenal glands, kidneys, inferior vena cava, and retroperitoneal lymph nodes are unremarkable. Calcification of the aorta without aneurysm. Calcification of the renal artery origins. Stomach, small bowel, and colon are not abnormally distended. No discrete wall  thickening is noted. No free air or free fluid in the abdomen. Abdominal wall musculature appears intact.  Pelvis: The appendix is normal. Prostate gland is enlarged. There is increased density in the bladder which could be early contrast or may indicate hemorrhage. Correlation with urinalysis is recommended. No bladder wall thickening. Mildly enlarged lymph node in the left external iliac chain measuring 16 mm diameter and in the right iliac chain measuring 13 mm diameter, probably reactive. Skin defect and infiltration in the left groin is probably postoperative. Small right inguinal hernia containing fat. Degenerative changes in the spine. No destructive bone lesions. Postoperative laminectomies at L3-4, L4-5, and L5-S1.  IMPRESSION: Postoperative changes in the left groin region with infiltration in the subcutaneous fat, likely postoperative. Prominent lymph nodes in the iliac chains bilaterally are most likely to be reactive. Increased density in the bladder suggest possible hemorrhage. Correlation with urinalysis. Right inguinal hernia containing fat.   Electronically Signed   By: Burman Nieves M.D.   On: 01/09/2015 04:16   Dg Chest Port 1 View  01/10/2015   CLINICAL DATA:  Encounter for central line placement.  EXAM: PORTABLE CHEST - 1 VIEW  COMPARISON:  Same day.  FINDINGS: Stable cardiomediastinal silhouette. Endotracheal tube tip is 3.4 cm above the carina. Nasogastric tube is seen entering the stomach. No pneumothorax is noted. There is been interval placement of left internal jugular catheter line with distal tip in the expected position of the SVC. Minimal right basilar subsegmental atelectasis is noted. Stable lingular and left lower lobe interstitial and airspace opacity is noted concerning for edema or pneumonia.  IMPRESSION: Lingular left lower lobe interstitial and airspace opacity concerning for edema or pneumonia. Endotracheal and nasogastric tubes are in grossly good position. Interval  placement of left internal jugular catheter line with distal tip overlying expected position of SVC.   Electronically Signed   By: Lupita Raider, M.D.   On: 01/10/2015 09:51   Portable Chest Xray  01/10/2015   CLINICAL DATA:  Endotracheal tube  EXAM: PORTABLE CHEST - 1 VIEW  COMPARISON:  01/09/2015  FINDINGS: Endotracheal tube tip is 3.2 cm from the carina. NG tube tip is beyond the gastroesophageal junction. Diffuse bilateral hazy airspace opacities left greater than right. Cardiomegaly. No pneumothorax.  IMPRESSION: New bilateral airspace disease left greater than right.  Endotracheal and NG tubes have been placed.  Cardiomegaly.   Electronically Signed   By: Jolaine Click M.D.   On: 01/10/2015 08:24   Dg Abd Portable 1v  01/10/2015   CLINICAL DATA:  Orogastric tube placement  EXAM: PORTABLE ABDOMEN - 1 VIEW  COMPARISON:  CT 01/09/2015  FINDINGS: Orogastric tube tip terminates over the expected location of the distal stomach/duodenum bulb. Retained contrast within nondilated colon. No gaseous bowel distention.  IMPRESSION: Tip of orogastric tube over the expected location of the distal stomach/duodenum bulb.   Electronically Signed   By: Christiana Pellant M.D.   On: 01/10/2015 10:05    EKG:  Sinus rhythm at 55 Left axis deviation without ST T changes  Echocardiogram 6/16 demonstrated normal LV function moderate aortic stenosis mean gradient of 32 and mild-moderate LAE Echocardiogram 11/15 had had a mean gradient of 21   Assessment and Plan:  Elevated troponin probably non-STEMI  Respiratory failure with intubation  Acute renal injury creatinine 1.25--1.88  January--June  Aortic stenosis-moderate  The patient has shock with a modestly elevated troponin and a profound AA gradient. The presentation is not clear to me but I don't think his aortic stenosis is sufficiently severe to be the problem nor do I think his elevated troponin in the context of normal LV function is sufficient to explain  things either.  I should note however that the echo preceded the collapse and so we will repeat the ultrasound in the morning.  I discussed the case with Dr. Inda Castle. Pulmonary embolism comes to mind as a potential diagnosis although he rightly points out that hypercarbia is not atypical finding. Given the elevated troponin and the PE concern, we have both decided that it is reasonable to proceed with heparin therapy. Venous Dopplers makes sense. Avoiding contrast at this juncture may help protect his kidneys and allow their recovery        Sherryl Manges

## 2015-01-10 NOTE — Progress Notes (Signed)
RN called about patient sats being in the mid to low 80's. RN had placed patient on a venti mask and RT  increased oxygen percentage without success.  RT placed patient on non rebreather and sats went up to 95%. Patient is very congested. RT is holding off on placing patient on CPAP due to patients congestion. RT will continue to monitor.

## 2015-01-10 NOTE — Progress Notes (Addendum)
Patient urgently transported to 2M11 via bed with O2 at 100% and heart monitor.  Staff at bedside to receive patient.  Urgent intubation performed.  Fentanyl, 2mg  Versed, 20 mg Etomidate given for procedure.  BP 80/37  HR 112 post intubation.  500cc NS bolus given IV.

## 2015-01-10 NOTE — Progress Notes (Signed)
47 Spoke with Dr. Tresa Endo from Cardiology concerning patients increasing troponin level of 17.13. Dr. Tresa Endo states continue to monitor the patient for changes and assess lab work; if the patients status changes, then contact critical care medicine first for further instruction.

## 2015-01-10 NOTE — Consult Note (Signed)
PULMONARY / CRITICAL CARE MEDICINE   Name: [name] MRN: [id number] DOB: 18-Oct-1932    ADMISSION DATE:  01/09/2015 CONSULTATION DATE:  01/10/15  REFERRING MD :  Dr. Gwenlyn Perking   CHIEF COMPLAINT:  AMS, Acute Respiratory Failure   INITIAL PRESENTATION: 81 y/o M with with recent (6/17) arteriography of RLE for venous ulcers who was admitted 6/18 with a 2 week hx of SOB.  Admitted per [facility] for further evaluation.  Treated with BiPAP but failed therapy and required intubation am of 6/19.  PCCM consulted for ICU transfer.    STUDIES:  6/18  CT ABD >> post-op changes in L groin, increased density in bladder, bibasilar atx 6/18  ECHO >> LVEF 55-60%, grade II diastolic dysfunction, moderate AS (wosening since last ECHO) 6/19  CT of Head >>   SIGNIFICANT EVENTS: 6/17  RLE arteriography per Dr. Edilia Bo for PVD and non-healing R toe wound 6/18  Admit to [facility] with complaints of SOB x 2 weeks 6/19  Decompensated early am, required intubation / ICU transfer   HISTORY OF PRESENT ILLNESS:   81 y/o M with PMH of gout, morbid obesity, RLS, retinal detachment (1994), CAD, Aortic Stenosis, HTN, PVD (carotid artery, peripheral venous disease), TIA, GERD, OSA, BPH, DM, anxiety / depression, lumbar disc disease s/p back surgery, inguinal hernia repair with insertion of mesh, CKD III (baseline sr cr ~ 1) and non-healing RLE venous ulcers with arteriography on 6/17 per Dr. Edilia Bo who presented to [facility] on 6/18 with a 2 week history of SOB.    On presentation, the patient reported worsening shortness of breath that had been progressive but worse since discharge.  He also reported intermittent abdominal pain and vomiting.  Denied cough & wheezing. He was recently seen by Cardiology with an ECHO (unable to obtain records).  Notes reflect BLE edema, murmur and hypoxemia.  The patient was admitted per [facility] for further work up of acute hypoxic respiratory failure.  Follow up ECHO was ordered.  He was treated with oxygen,  home medications were continued and bipap therapy.  He unfortunately decompensated the am of 6/19 and required intubation for acute respiratory failure.  PCCM called for ICU transfer.    Family reports they have been living with the patient for last 6 months to assist him with care.    PAST MEDICAL HISTORY :   has a past medical history of Gout, unspecified; Obesity (BMI 30-39.9); Restless leg syndrome; History of retinal detachment (1994); Hypertensive heart disease; CAD (coronary artery disease), native coronary artery; History of pericarditis (2007); GERD; Osteoarthritis; Sleep apnea in adult; TIA (transient ischemic attack) (2005); BPH (benign prostatic hypertrophy); Lumbar spinal stenosis; Carotid artery occlusion; Unspecified venous (peripheral) insufficiency; Stroke (May 2003); Anxiety; Depression; Aortic stenosis; CAD (coronary artery disease); Carotid artery disease; Type 2 diabetes mellitus with vascular disease; Lumbar disc disease; CKD (chronic kidney disease), stage III (02/08/2014); Hypertension; CHF (congestive heart failure); and Renal insufficiency.  has past surgical history that includes Pericardial window (2007); left arm (2008); Right arm (1970's); Cataract extraction; Retinal detachment surgery; Carotid endarterectomy (Left, 12-06-05); Coronary angioplasty (01/2009); Colonoscopy; Carpal tunnel release (Bilateral); Back surgery (2013); Inguinal hernia repair (Left, 10/01/2013); Insertion of mesh (Left, 10/01/2013); and Finger amputation (May 2015).    Prior to Admission medications   Medication Sig Start Date End Date Taking? Authorizing Provider  acetaminophen (TYLENOL) 325 MG tablet Take 2 tablets (650 mg total) by mouth every 6 (six) hours as needed for mild pain or moderate pain. Patient taking differently: Take  325 mg by mouth every 6 (six) hours as needed for mild pain or moderate pain.  07/21/14  Yes Elease Etienne, MD  allopurinol (ZYLOPRIM) 300 MG tablet Take 1 tablet (300 mg  total) by mouth every morning. 10/06/14  Yes Newt Lukes, MD  amLODipine (NORVASC) 5 MG tablet Take 5 mg by mouth 2 (two) times daily. 11/18/14  Yes Historical Provider, MD  aspirin EC 81 MG tablet Take 81 mg by mouth daily.   Yes Historical Provider, MD  buPROPion (WELLBUTRIN XL) 150 MG 24 hr tablet Take 1 tablet (150 mg total) by mouth every morning. 10/06/14  Yes Newt Lukes, MD  cholecalciferol (VITAMIN D) 1000 UNITS tablet Take 1,000 Units by mouth daily.   Yes Historical Provider, MD  clopidogrel (PLAVIX) 75 MG tablet Take 1 tablet (75 mg total) by mouth daily. 11/11/14  Yes Newt Lukes, MD  docusate sodium 100 MG CAPS Take 100 mg by mouth 2 (two) times daily. 07/16/14  Yes Ripudeep Jenna Luo, MD  Ferrous Gluconate 256 (28 FE) MG TABS Take 256 mg by mouth daily.   Yes Historical Provider, MD  furosemide (LASIX) 40 MG tablet Take 1 tablet (40 mg total) by mouth daily. Patient taking differently: Take 40 mg by mouth 2 (two) times daily.  07/21/14  Yes Elease Etienne, MD  LORazepam (ATIVAN) 1 MG tablet Take 1 tablet (1 mg total) by mouth every 8 (eight) hours as needed for anxiety or sleep. 09/10/14  Yes Newt Lukes, MD  Magnesium 250 MG TABS Take 250 mg by mouth daily.    Yes Historical Provider, MD  metoprolol succinate (TOPROL-XL) 50 MG 24 hr tablet Take 1 tablet (50 mg total) by mouth daily. Take with or immediately following a meal. 11/11/14  Yes Newt Lukes, MD  mirabegron ER (MYRBETRIQ) 50 MG TB24 tablet Take 1 tablet (50 mg total) by mouth daily. 10/06/14  Yes Newt Lukes, MD  mirtazapine (REMERON) 15 MG tablet Take 1 tablet (15 mg total) by mouth at bedtime. 09/10/14  Yes Newt Lukes, MD  pantoprazole (PROTONIX) 40 MG tablet Take 1 tablet (40 mg total) by mouth 2 (two) times daily. 10/06/14  Yes Newt Lukes, MD  pregabalin (LYRICA) 200 MG capsule Take 1 capsule (200 mg total) by mouth at bedtime. 12/15/14  Yes Newt Lukes, MD  rOPINIRole  (REQUIP) 4 MG tablet Take 1 tablet (4 mg total) by mouth at bedtime. 10/06/14  Yes Newt Lukes, MD  sodium chloride (OCEAN) 0.65 % SOLN nasal spray Place 1 spray into both nostrils as needed for congestion.   Yes Historical Provider, MD  tamsulosin (FLOMAX) 0.4 MG CAPS capsule Take 1 capsule (0.4 mg total) by mouth daily. 11/11/14  Yes Newt Lukes, MD   Allergies  Allergen Reactions  . Statins Other (See Comments)    Severe leg myalgias, weakness    FAMILY HISTORY:  indicated that his mother is deceased. He indicated that his father is deceased. He indicated that his son is alive.    SOCIAL HISTORY:  reports that he has never smoked. He has never used smokeless tobacco. He reports that he does not drink alcohol or use illicit drugs.  REVIEW OF SYSTEMS:  Unable to complete as patient is altered on mechanical ventilation.   SUBJECTIVE: RN reports hypotension & bradycardia   VITAL SIGNS: Temp:  [97.9 F (36.6 C)-100.4 F (38 C)] 99.4 F (37.4 C) (06/19 0817) Pulse Rate:  [76-116]  94 (06/19 0800) Resp:  [11-36] 21 (06/19 0800) BP: (80-128)/(35-68) 82/36 mmHg (06/19 0800) SpO2:  [83 %-99 %] 96 % (06/19 0800) FiO2 (%):  [31 %-100 %] 100 % (06/19 0751) Weight:  [250 lb 3.6 oz (113.5 kg)] 250 lb 3.6 oz (113.5 kg) (06/19 0227)   HEMODYNAMICS:     VENTILATOR SETTINGS: Vent Mode:  [-] PRVC FiO2 (%):  [31 %-100 %] 100 % Set Rate:  [18 bmp] 18 bmp Vt Set:  [560 mL] 560 mL PEEP:  [8 cmH20] 8 cmH20 Plateau Pressure:  [20 cmH20] 20 cmH20   INTAKE / OUTPUT:  Intake/Output Summary (Last 24 hours) at 01/10/15 0835 Last data filed at 01/09/15 2300  Gross per 24 hour  Intake    720 ml  Output   1120 ml  Net   -400 ml    PHYSICAL EXAMINATION: General:  Obese male in NAD on vent Neuro:  Sedate on vent, myoclonic twitching of tongue noted  HEENT:  OETT, mm pink/moist, short / thick neck - difficult to assess JVD Cardiovascular:  s1s2 rrr, 4-5/6 SEM heard best at 2nd ISC  RSB Lungs:  resp's even/non-labored on vent, lungs bilaterally coarse  Abdomen:  Obese/soft, bsx4 active Musculoskeletal:  No acute deformities  Skin:  Warm/dry, RLE wrapped, LLE small venous ulcer on shin  LABS:  CBC  Recent Labs Lab 01/08/15 0624 01/09/15 0103 01/09/15 0121  WBC  --  9.7  --   HGB 17.0 15.2 17.0  HCT 50.0 46.6 50.0  PLT  --  131*  --    Coag's No results for input(s): APTT, INR in the last 168 hours.   BMET  Recent Labs Lab 01/08/15 0624 01/09/15 0103 01/09/15 0121  NA 141 137 140  K 4.1 4.5 4.5  CL 100* 100* 101  CO2  --  28  --   BUN 39* 30* 34*  CREATININE 1.70* 1.56* 1.60*  GLUCOSE 107* 168* 164*   Electrolytes  Recent Labs Lab 01/09/15 0103  CALCIUM 9.1   Sepsis Markers  Recent Labs Lab 01/09/15 0122  LATICACIDVEN 2.46*   ABG  Recent Labs Lab 01/10/15 0150 01/10/15 0602  PHART 7.297* 7.286*  PCO2ART CRITICAL RESULT CALLED TO, READ BACK BY AND VERIFIED WITH: 64.1*  PO2ART 62.5* 61.9*   Liver Enzymes  Recent Labs Lab 01/09/15 0103  AST 26  ALT 17  ALKPHOS 100  BILITOT 0.7  ALBUMIN 3.5   Cardiac Enzymes  Recent Labs Lab 01/09/15 0103  TROPONINI 0.03   Glucose  Recent Labs Lab 01/08/15 0555  GLUCAP 96    Imaging Portable Chest Xray  01/10/2015   CLINICAL DATA:  Endotracheal tube  EXAM: PORTABLE CHEST - 1 VIEW  COMPARISON:  01/09/2015  FINDINGS: Endotracheal tube tip is 3.2 cm from the carina. NG tube tip is beyond the gastroesophageal junction. Diffuse bilateral hazy airspace opacities left greater than right. Cardiomegaly. No pneumothorax.  IMPRESSION: New bilateral airspace disease left greater than right.  Endotracheal and NG tubes have been placed.  Cardiomegaly.   Electronically Signed   By: Jolaine Click M.D.   On: 01/10/2015 08:24     ASSESSMENT / PLAN:  PULMONARY OETT 6/19 >> A: Acute Respiratory Failure - ? Infiltrates on CT ABD Bibasilar Infiltrates - concern for HCAP  Never Smoker  OSA   P:   MV support, 8 cc/kg Wean PEEP/FiO2 for sats > 90% Trend CXR  PRN albuterol  Follow up ABG  CARDIOVASCULAR CVL L IJ 6/19 >>  A:  Hypotension - concern for sepsis vs cardiogenic shock  Aortic Stenosis - worsening of valvular disease noted CAD s/p PCI (remote) PVD s/p Arteriography 6/17 per Dr. Edilia Bo  P:  ICU monitoring  Neosynephrine for MAP > 65 ECHO reviewed, will need evaluation once stabilized  Cycle troponin EKG now Hold norvasc & XL Toprol with hypotension  S/P lasix 60 mg x1 early am 6/19 Continue ASA, Plavix  May need to involve Dr. Edilia Bo   RENAL A:   Acute on Chronic Kidney Injury P:   Trend BMP / UOP  Catheter as below for I/O's  Replace electrolytes as indicated   GASTROINTESTINAL / GU  A:   GERD BPH - difficult to place catheter P:   Attempt coude catheter, if unable to place, will need Urology consult Continue mirabegron + Flomax PPI  NPO Place OGT Begin nutrition   HEMATOLOGIC A:  No acute issues  P:  DVT prophylaxis:    INFECTIOUS A:   Bibasilar Airspace Disease - r/o HCAP  Recent UTI P:   BCx2 6/19 >>  Sputum 6/19 >>  UA 6/19 >>  UC 6/19 >>  Vanco, start date 6/19, day 1/x Zosyn, start date 6/19, day 1/x   Trend PCT, lactic acid  Monitor fever curve / wbc   ENDOCRINE A:   Mild Hyperglycemia  P:   Monitor glucose on BMP  NEUROLOGIC A:  Acute Encephalopathy - suspect hypotension related P:   RASS goal: 0 Fentanyl gtt for pain  PRN versed for sedation  CT Head now to assess for acute neurologic process  Consider EEG if CT head negative & neuro status does not improve with support    FAMILY  - Updates: Family updated at beside.    - Inter-disciplinary family meet or Palliative Care meeting due by: 6/27    Canary Brim, NP-C [facility] Pgr: [phone] or [phone] 01/10/2015, 8:35 AM

## 2015-01-10 NOTE — Progress Notes (Signed)
NUTRITION NOTE  Brief Nutrition Note  Consult received for enteral/tube feeding initiation and management.  Adult Enteral Nutrition Protocol initiated. Full assessment to follow.  Initiate TF via  with OGT/NGT at 25 ml/h and Prostat 30 ml BID on day 1; on day 2, increase to goal rate of 40 ml/h (960 ml per day) to provide 960 kcals, 84 gm protein, 802.5 ml free water daily.   Admitting Dx: SOB (shortness of breath) [R06.02]  Body mass index is 36.93 kg/(m^2). Pt meets criteria for obesity based on current BMI.  Labs:   Recent Labs Lab 01/08/15 0624 01/09/15 0103 01/09/15 0121  NA 141 137 140  K 4.1 4.5 4.5  CL 100* 100* 101  CO2  --  28  --   BUN 39* 30* 34*  CREATININE 1.70* 1.56* 1.60*  CALCIUM  --  9.1  --   GLUCOSE 107* 168* 164*     Trenton Gammon, RD, LDN Inpatient Clinical Dietitian Pager # [phone] After hours/weekend pager # [phone]

## 2015-01-10 NOTE — Progress Notes (Addendum)
Pt admitted to [facility]11 at 0740 in unstable condition. RT and RN accompanying patient in transfer. MD at the bedside. Plan is to intubate patient. Patient's belongings with patient. Family was called and updated and consented. Plan of care to be updated and continued.

## 2015-01-10 NOTE — Procedures (Signed)
Intubation Procedure Note [name] [id number] 06-23-1933  Procedure: Intubation Indications: Respiratory insufficiency  Procedure Details Consent: Risks of procedure as well as the alternatives and risks of each were explained to the (patient/caregiver).  Consent for procedure obtained. Time Out: Verified patient identification, verified procedure, site/side was marked, verified correct patient position, special equipment/implants available, medications/allergies/relevent history reviewed, required imaging and test results available.  Performed  Maximum sterile technique was used including gloves, hand hygiene and mask.  MAC and 4    Evaluation Hemodynamic Status: Transient hypotension treated with fluid; O2 sats: transiently fell during during procedure Patient's Current Condition: stable Complications: No apparent complications Patient did tolerate procedure well. Chest X-ray ordered to verify placement.  CXR: pending.   ALVA,RAKESH V. 01/10/2015

## 2015-01-10 NOTE — Plan of Care (Signed)
Problem: ICU Phase Progression Outcomes Goal: Voiding-avoid urinary catheter unless indicated Outcome: Not Met (add Reason) Foley placed  Problem: Phase II Progression Outcomes Goal: Pain controlled on oral analgesia Outcome: Not Met (add Reason) Pt intubated

## 2015-01-10 NOTE — Consult Note (Signed)
Reason for Consult:Tongue movements Referring Physician: Vassie Loll  CC: Tongue movements  HPI: [name] is an 81 y.o. male with multiple medical problems who presented with complaints of shortness of breath that had been present for the past 2 weeks that acutely worsened prior to admission.  While admitted experienced respiratory distress eventually requiring intubation.  Blood gases are consistent with hypercarbic respiratory failure.  He experienced periods of hypoxia.  Has also experienced periods of hypotension.  Chest x-ray shows possible pneumonia.  Patient with low grade temps.   Today patient noted to have jerking movements of his tongue.  With stimulation this worsens and patient has jerking movements all over.   On review of the chart the patient has had multiple admissions for shortness of breath/metabolic concerns.  Each time this appears to be associated with mental status decline.  He has a history of cognitive decline associated with hallucinations.    Past Medical History  Diagnosis Date  . Gout, unspecified     severe dz, "treatment done that last 15 years"  . Obesity (BMI 30-39.9)   . Restless leg syndrome   . History of retinal detachment 1994  . Hypertensive heart disease   . CAD (coronary artery disease), native coronary artery   . History of pericarditis 2007    MSSA, s/p pericardial window  . GERD   . Osteoarthritis   . Sleep apnea in adult     CPAP qhs  . TIA (transient ischemic attack) 2005  . BPH (benign prostatic hypertrophy)     "minor"  . Lumbar spinal stenosis   . Carotid artery occlusion   . Unspecified venous (peripheral) insufficiency   . Stroke May 2003  . Anxiety   . Depression   . Aortic stenosis     ECHO 05/29/14 moderate AS Mean gradient 21 mm EF 55% ECHO 07/09/13  Mean gradient 12 mm Mild to moderate AS EF 50%    . CAD (coronary artery disease)     02/11/09  Xience stent to circumflex 2.5 x 18 mm postdilated to 2.75 mm  Cath sept 2011  normal  Left main, 20 % stenosis proximal LAD, 40% stenosis mid LAD, 70% stenosis proximal Diag 1, 90% stenosis prox CFX, 90% first OM, small and nondominant RCA;   . Carotid artery disease     Prior TIA 2006 with left CEA by Dr. Arbie Cookey Recurrent TIA in April 2011   . Type 2 diabetes mellitus with vascular disease   . Lumbar disc disease   . CKD (chronic kidney disease), stage III 02/08/2014  . Hypertension   . CHF (congestive heart failure)   . Renal insufficiency     Past Surgical History  Procedure Laterality Date  . Pericardial window  2007  . Left arm  2008    shoulder  . Right arm  1970's    shoulder  . Cataract extraction      Left side x's 2 and right  . Retinal detachment surgery      left side  . Carotid endarterectomy Left 12-06-05    cea  . Coronary angioplasty  01/2009    x 1 stent  . Colonoscopy    . Carpal tunnel release Bilateral   . Back surgery  2013    removed bone spurs  . Inguinal hernia repair Left 10/01/2013    Procedure: HERNIA REPAIR INGUINAL ADULT;  Surgeon: Axel Filler, MD;  Location: [facility];  Service: General;  Laterality: Left;  . Insertion of mesh Left  10/01/2013    Procedure: INSERTION OF MESH;  Surgeon: Axel Filler, MD;  Location: [facility];  Service: General;  Laterality: Left;  . Finger amputation  May 2015    JUST TO FIRST JOINT RIGHT HAND  LAST FINGER ( Right 5th finger)    Family History  Problem Relation Age of Onset  . Heart disease Mother     Before age 55  . Heart attack Mother   . Diabetes Father   . Heart disease Father   . Breast cancer Sister   . Cancer Sister   . Prostate cancer Brother   . Lung cancer Brother   . Cancer Brother   . Diabetes Daughter   . Diabetes Son   . Hypertension Son     Social History:  reports that he has never smoked. He has never used smokeless tobacco. He reports that he does not drink alcohol or use illicit drugs.  Allergies  Allergen Reactions  . Statins Other (See Comments)    Severe leg  myalgias, weakness    Medications:  I have reviewed the patient's current medications. Prior to Admission:  Prescriptions prior to admission  Medication Sig Dispense Refill Last Dose  . acetaminophen (TYLENOL) 325 MG tablet Take 2 tablets (650 mg total) by mouth every 6 (six) hours as needed for mild pain or moderate pain. (Patient taking differently: Take 325 mg by mouth every 6 (six) hours as needed for mild pain or moderate pain. )   01/08/2015 at Unknown time  . allopurinol (ZYLOPRIM) 300 MG tablet Take 1 tablet (300 mg total) by mouth every morning. 90 tablet 1 01/08/2015 at Unknown time  . amLODipine (NORVASC) 5 MG tablet Take 5 mg by mouth 2 (two) times daily.   01/08/2015 at Unknown time  . aspirin EC 81 MG tablet Take 81 mg by mouth daily.   01/08/2015 at Unknown time  . buPROPion (WELLBUTRIN XL) 150 MG 24 hr tablet Take 1 tablet (150 mg total) by mouth every morning. 90 tablet 1 01/08/2015 at Unknown time  . cholecalciferol (VITAMIN D) 1000 UNITS tablet Take 1,000 Units by mouth daily.   01/08/2015 at Unknown time  . clopidogrel (PLAVIX) 75 MG tablet Take 1 tablet (75 mg total) by mouth daily. 90 tablet 3 01/08/2015 at Unknown time  . docusate sodium 100 MG CAPS Take 100 mg by mouth 2 (two) times daily. 10 capsule 0 01/08/2015 at Unknown time  . Ferrous Gluconate 256 (28 FE) MG TABS Take 256 mg by mouth daily.   01/08/2015 at Unknown time  . furosemide (LASIX) 40 MG tablet Take 1 tablet (40 mg total) by mouth daily. (Patient taking differently: Take 40 mg by mouth 2 (two) times daily. )   01/08/2015 at Unknown time  . LORazepam (ATIVAN) 1 MG tablet Take 1 tablet (1 mg total) by mouth every 8 (eight) hours as needed for anxiety or sleep. 90 tablet 3 01/08/2015 at Unknown time  . Magnesium 250 MG TABS Take 250 mg by mouth daily.    01/08/2015 at Unknown time  . metoprolol succinate (TOPROL-XL) 50 MG 24 hr tablet Take 1 tablet (50 mg total) by mouth daily. Take with or immediately following a meal. 90  tablet 3 01/08/2015 at 0800  . mirabegron ER (MYRBETRIQ) 50 MG TB24 tablet Take 1 tablet (50 mg total) by mouth daily. 90 tablet 1 01/08/2015 at Unknown time  . mirtazapine (REMERON) 15 MG tablet Take 1 tablet (15 mg total) by mouth at bedtime. 30  tablet 11 01/08/2015 at Unknown time  . pantoprazole (PROTONIX) 40 MG tablet Take 1 tablet (40 mg total) by mouth 2 (two) times daily. 90 tablet 1 01/08/2015 at Unknown time  . pregabalin (LYRICA) 200 MG capsule Take 1 capsule (200 mg total) by mouth at bedtime. 90 capsule 1 01/08/2015 at Unknown time  . rOPINIRole (REQUIP) 4 MG tablet Take 1 tablet (4 mg total) by mouth at bedtime. 90 tablet 1 01/08/2015 at Unknown time  . sodium chloride (OCEAN) 0.65 % SOLN nasal spray Place 1 spray into both nostrils as needed for congestion.   01/08/2015 at Unknown time  . tamsulosin (FLOMAX) 0.4 MG CAPS capsule Take 1 capsule (0.4 mg total) by mouth daily. 90 capsule 3 01/08/2015 at Unknown time   Scheduled: . allopurinol  300 mg Oral q morning - 10a  . antiseptic oral rinse  7 mL Mouth Rinse QID  . aspirin EC  81 mg Oral Daily  . atropine      . buPROPion  150 mg Oral q morning - 10a  . chlorhexidine  15 mL Mouth Rinse BID  . clopidogrel  75 mg Oral Daily  . docusate  100 mg Per Tube BID  . feeding supplement (VITAL HIGH PROTEIN)  1,000 mL Per Tube Q24H  . furosemide  60 mg Intravenous Once  . magnesium oxide  400 mg Oral Daily  . mirabegron ER  50 mg Oral Daily  . pantoprazole (PROTONIX) IV  40 mg Intravenous Daily  . piperacillin-tazobactam (ZOSYN)  IV  3.375 g Intravenous Q8H  . pregabalin  200 mg Oral QHS  . rOPINIRole  4 mg Oral QHS  . tamsulosin  0.4 mg Oral Daily  . [START ON 01/11/2015] vancomycin  1,250 mg Intravenous Q24H    ROS: Unable to obtain due to mental status  Physical Examination: Blood pressure 108/32, pulse 68, temperature 100.8 F (38.2 C), temperature source Oral, resp. rate 18, height  (1.753 m), weight 113.5 kg (250 lb 3.6 oz),  SpO2 96 %.  HEENT-  Normocephalic, no lesions, without obvious abnormality.  Normal external eye and conjunctiva.  Normal TM's bilaterally.  Normal auditory canals and external ears. Normal external nose, mucus membranes and septum.  Normal pharynx. Cardiovascular- S1, S2 normal, pulses palpable throughout   Lungs- chest clear, no wheezing, rales, normal symmetric air entry Abdomen- soft, non-tender; bowel sounds normal; no masses,  no organomegaly Extremities- mild lower extremity edema Lymph-no adenopathy palpable Musculoskeletal-no joint tenderness, deformity or swelling Skin-dressing on RLE and wound on LLE  Neurological Examination Mental Status: Patient does not respond to verbal stimuli.  With deep sternal rub begins to jerk throughout.  Does not follow commands.  No verbalizations noted.  Cranial Nerves: II: patient does not respond confrontation bilaterally, pupils right 2 mm, left 3 mm,and reactive bilaterally III,IV,VI: doll's response absent bilaterally.  V,VII: corneal reflex reduced bilaterally  VIII: patient does not respond to verbal stimuli IX,X: gag reflex unable to be tested, XI: trapezius strength unable to test bilaterally XII: tongue strength unable to test.  Tongue juts out at times, more frequent with stimulation Motor: Extremities flaccid throughout.  No spontaneous movement noted but with stimulation jerking noted throughout.  No purposeful movements noted. Sensory: Does not respond to noxious stimuli in any extremity. Deep Tendon Reflexes:  1+ throughout with absent AJ's. Plantars: mute bilaterally Cerebellar: Unable to perform   Laboratory Studies:   Basic Metabolic Panel:  Recent Labs Lab 01/08/15 0624 01/09/15 0103 01/09/15 0121 01/10/15  1100  NA 141 137 140 137  K 4.1 4.5 4.5 4.8  CL 100* 100* 101 100*  CO2  --  28  --  31  GLUCOSE 107* 168* 164* 169*  BUN 39* 30* 34* 34*  CREATININE 1.70* 1.56* 1.60* 1.88*  CALCIUM  --  9.1  --  8.2*     Liver Function Tests:  Recent Labs Lab 01/09/15 0103  AST 26  ALT 17  ALKPHOS 100  BILITOT 0.7  PROT 7.3  ALBUMIN 3.5    Recent Labs Lab 01/09/15 0103  LIPASE 26   No results for input(s): AMMONIA in the last 168 hours.  CBC:  Recent Labs Lab 01/08/15 0624 01/09/15 0103 01/09/15 0121  WBC  --  9.7  --   NEUTROABS  --  7.5  --   HGB 17.0 15.2 17.0  HCT 50.0 46.6 50.0  MCV  --  91.0  --   PLT  --  131*  --     Cardiac Enzymes:  Recent Labs Lab 01/09/15 0103 01/10/15 1100  TROPONINI 0.03 4.22*    BNP: Invalid input(s): POCBNP  CBG:  Recent Labs Lab 01/08/15 0555 01/10/15 0816 01/10/15 1247  GLUCAP 96 178* 141*    Microbiology: Results for orders placed or performed during the hospital encounter of 01/09/15  MRSA PCR Screening     Status: None   Collection Time: 01/10/15  2:31 AM  Result Value Ref Range Status   MRSA by PCR NEGATIVE NEGATIVE Final    Comment:        The GeneXpert MRSA Assay (FDA approved for NASAL specimens only), is one component of a comprehensive MRSA colonization surveillance program. It is not intended to diagnose MRSA infection nor to guide or monitor treatment for MRSA infections.   MRSA PCR Screening     Status: None   Collection Time: 01/10/15  8:01 AM  Result Value Ref Range Status   MRSA by PCR NEGATIVE NEGATIVE Final    Comment:        The GeneXpert MRSA Assay (FDA approved for NASAL specimens only), is one component of a comprehensive MRSA colonization surveillance program. It is not intended to diagnose MRSA infection nor to guide or monitor treatment for MRSA infections.     Coagulation Studies: No results for input(s): LABPROT, INR in the last 72 hours.  Urinalysis:  Recent Labs Lab 01/09/15 0500 01/10/15 1021  COLORURINE YELLOW YELLOW  LABSPEC 1.021 1.016  PHURINE 5.0 5.0  GLUCOSEU NEGATIVE NEGATIVE  HGBUR NEGATIVE TRACE*  BILIRUBINUR NEGATIVE NEGATIVE  KETONESUR NEGATIVE  NEGATIVE  PROTEINUR NEGATIVE NEGATIVE  UROBILINOGEN 0.2 0.2  NITRITE NEGATIVE NEGATIVE  LEUKOCYTESUR NEGATIVE SMALL*    Lipid Panel:     Component Value Date/Time   CHOL 190 07/15/2014 0325   TRIG 149 07/15/2014 0325   TRIG 154 04/13/2010   HDL 32* 07/15/2014 0325   CHOLHDL 5.9 07/15/2014 0325   VLDL 30 07/15/2014 0325   LDLCALC 128* 07/15/2014 0325    HgbA1C:  Lab Results  Component Value Date   HGBA1C 6.2* 07/15/2014    Urine Drug Screen:  No results found for: LABOPIA, COCAINSCRNUR, LABBENZ, AMPHETMU, THCU, LABBARB  Alcohol Level: No results for input(s): ETH in the last 168 hours.  Other results: EKG: sinus bradycardia at 55 bpm.  Imaging: Dg Chest 2 View  01/09/2015   CLINICAL DATA:  81 year old male with shortness of Breath, recent hospitalization for lower extremity swelling and shortness of Breath. Subsequent encounter.  EXAM:  CHEST  2 VIEW  COMPARISON:  07/17/2014 and earlier.  FINDINGS: Semi upright AP and lateral views of the chest. Stable cardiomegaly and mediastinal contours. No pneumothorax, pulmonary edema, pleural effusion or confluent pulmonary opacity. No acute osseous abnormality identified.  IMPRESSION: No acute cardiopulmonary abnormality.   Electronically Signed   By: Odessa Fleming M.D.   On: 01/09/2015 01:26   Ct Head Wo Contrast  01/10/2015   CLINICAL DATA:  Altered mental status.  EXAM: CT HEAD WITHOUT CONTRAST  TECHNIQUE: Contiguous axial images were obtained from the base of the skull through the vertex without intravenous contrast.  COMPARISON:  07/13/2014.  FINDINGS: Diffusely enlarged ventricles and subarachnoid spaces. Patchy white matter low density in both cerebral hemispheres. Stable small, more focal area of low density in the left frontoparietal white matter. No intracranial hemorrhage, mass lesion or CT evidence of acute infarction. Unremarkable bones and included paranasal sinuses.  IMPRESSION: 1. No acute abnormality. 2. Stable moderate diffuse  cerebral atrophy and mild chronic small vessel white matter ischemic changes in both cerebral hemispheres. 3. 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   Electronically Signed   By: Beckie Salts M.D.   On: 01/10/2015 12:34   Ct Abdomen Pelvis W Contrast  01/09/2015   CLINICAL DATA:  Intermittent ongoing shortness of breath for 2 weeks, worse today. Abdominal pain for a few days with nausea. Recent discharge from hospital early this morning after stent placed in the lower extremity.  EXAM: CT ABDOMEN AND PELVIS WITH CONTRAST  TECHNIQUE: Multidetector CT imaging of the abdomen and pelvis was performed using the standard protocol following bolus administration of intravenous contrast.  CONTRAST:  23mL OMNIPAQUE IOHEXOL 300 MG/ML  SOLN  COMPARISON:  None.  FINDINGS: Atelectasis in the lung bases. Coronary artery and aortic valve calcifications.  Mild diffuse fatty infiltration of the liver. No focal liver lesions. The gallbladder, spleen, pancreas, adrenal glands, kidneys, inferior vena cava, and retroperitoneal lymph nodes are unremarkable. Calcification of the aorta without aneurysm. Calcification of the renal artery origins. Stomach, small bowel, and colon are not abnormally distended. No discrete wall thickening is noted. No free air or free fluid in the abdomen. Abdominal wall musculature appears intact.  Pelvis: The appendix is normal. Prostate gland is enlarged. There is increased density in the bladder which could be early contrast or may indicate hemorrhage. Correlation with urinalysis is recommended. No bladder wall thickening. Mildly enlarged lymph node in the left external iliac chain measuring 16 mm diameter and in the right iliac chain measuring 13 mm diameter, probably reactive. Skin defect and infiltration in the left groin is probably postoperative. Small right inguinal hernia containing fat. Degenerative changes in the spine. No destructive bone lesions. Postoperative  laminectomies at L3-4, L4-5, and L5-S1.  IMPRESSION: Postoperative changes in the left groin region with infiltration in the subcutaneous fat, likely postoperative. Prominent lymph nodes in the iliac chains bilaterally are most likely to be reactive. Increased density in the bladder suggest possible hemorrhage. Correlation with urinalysis. Right inguinal hernia containing fat.   Electronically Signed   By: Burman Nieves M.D.   On: 01/09/2015 04:16   Dg Chest Port 1 View  01/10/2015   CLINICAL DATA:  Encounter for central line placement.  EXAM: PORTABLE CHEST - 1 VIEW  COMPARISON:  Same day.  FINDINGS: Stable cardiomediastinal silhouette. Endotracheal tube tip is 3.4 cm above the carina. Nasogastric tube is seen entering the stomach. No pneumothorax is noted. There is been interval placement of left  internal jugular catheter line with distal tip in the expected position of the SVC. Minimal right basilar subsegmental atelectasis is noted. Stable lingular and left lower lobe interstitial and airspace opacity is noted concerning for edema or pneumonia.  IMPRESSION: Lingular left lower lobe interstitial and airspace opacity concerning for edema or pneumonia. Endotracheal and nasogastric tubes are in grossly good position. Interval placement of left internal jugular catheter line with distal tip overlying expected position of SVC.   Electronically Signed   By: Lupita Raider, M.D.   On: 01/10/2015 09:51   Portable Chest Xray  01/10/2015   CLINICAL DATA:  Endotracheal tube  EXAM: PORTABLE CHEST - 1 VIEW  COMPARISON:  01/09/2015  FINDINGS: Endotracheal tube tip is 3.2 cm from the carina. NG tube tip is beyond the gastroesophageal junction. Diffuse bilateral hazy airspace opacities left greater than right. Cardiomegaly. No pneumothorax.  IMPRESSION: New bilateral airspace disease left greater than right.  Endotracheal and NG tubes have been placed.  Cardiomegaly.   Electronically Signed   By: Jolaine Click M.D.    On: 01/10/2015 08:24   Dg Abd Portable 1v  01/10/2015   CLINICAL DATA:  Orogastric tube placement  EXAM: PORTABLE ABDOMEN - 1 VIEW  COMPARISON:  CT 01/09/2015  FINDINGS: Orogastric tube tip terminates over the expected location of the distal stomach/duodenum bulb. Retained contrast within nondilated colon. No gaseous bowel distention.  IMPRESSION: Tip of orogastric tube over the expected location of the distal stomach/duodenum bulb.   Electronically Signed   By: Christiana Pellant M.D.   On: 01/10/2015 10:05     Assessment/Plan: 81 year old male presenting with SOB.  Now intubated and jerking movements noted of the tongue and extremities.  Most appear to be stimulus induced although  Tongue movements can be noted independently of stimulation.  At this time doubt seizure as etiology but due suspect a toxic/metabolic etiology as source.  Renal function worse from baseline and chest x-ray shows evidence of infection.  Temps noted.  Some medications may be contributing as well.   Head CT personally reviewed and shows no acute changes.    Recommendations: 1.  Agree with EEG 2.  Would D/C Remeron, Lyrica, Remeron 3.  Ammonia  Thana Farr, MD [facility] [phone] 01/10/2015, 4:08 PM

## 2015-01-10 NOTE — Procedures (Signed)
RT arrived to find that the pt ripped off his BIPAP mask.  Pt confused, agitated, and very combative.  Pt placed on NRB and in restraints. ABG obtained and critical values given to RN.

## 2015-01-10 NOTE — Procedures (Signed)
Central Venous Catheter Insertion Procedure Note [name] [id number] [birth year]  Procedure: Insertion of Central Venous Catheter Indications: Assessment of intravascular volume, Drug and/or fluid administration and Frequent blood sampling  Procedure Details Consent: Risks of procedure as well as the alternatives and risks of each were explained to the (patient/caregiver).  Consent for procedure obtained. Time Out: Verified patient identification, verified procedure, site/side was marked, verified correct patient position, special equipment/implants available, medications/allergies/relevent history reviewed, required imaging and test results available.  Performed  Maximum sterile technique was used including antiseptics, cap, gloves, gown, hand hygiene, mask and sheet. Skin prep: Chlorhexidine; local anesthetic administered A antimicrobial bonded/coated triple lumen catheter was placed in the left internal jugular vein to 20 cm using the Seldinger technique.  Evaluation Blood flow good Complications: No apparent complications Patient did tolerate procedure well. Chest X-ray ordered to verify placement.  CXR: pending.   Procedure performed under direct supervision of Dr. Vassie Loll and with ultrasound guidance for real time vessel cannulation.      Mason Brim, NP-C [facility] Pgr: [phone] or [phone] 01/10/2015, 9:12 AM

## 2015-01-10 NOTE — Progress Notes (Signed)
CRITICAL VALUE ALERT  Critical value received:  Troponin 17.13  Date of notification:  01/10/2015  Time of notification:  1730  Critical value read back:Yes.    Nurse who received alert:  Ree Edman, RN   MD notified (1st page):  Shan Levans, MD (CCM)   Time of first page:  [phone]  Responding MD:  Shan Levans, MD (CCM)  Time MD responded:  [phone]  Dr. Delford Field stated to call cardiology and inform them of the increase from previous troponin level.  [facility] paged for Dr. Graciela Husbands.

## 2015-01-10 NOTE — Significant Event (Signed)
Rapid Response Event Note Called to see pt with low O2sats Overview: Time Called: 0119 Arrival Time: 0123 Event Type: Respiratory  Initial Focused Assessment: On arrival to room the pt is sitting up in bed sats 94% NRB, RR 36.  Pt responds easily to question but does not hold a conversation.  Min. Air movement t/o lung fields.  Pt denies wearing O2 at home only full face mask CPAP at night. Pt placed on CPAP without change in sats, machine changed to BiPap mode & plans made to tx pt to SDU for Bipap. ABG results provided to T. Claiborne Billings, NP.  On tx to SDU pt alert and talkative, pleasant.  Maintaining O2 sats on NRB for tx.    Interventions:  ABG Bipap Tx SDU  Event Summary: Benedetto Coons, NP   at      at          [facility], [place] Rishel Hedgecock

## 2015-01-10 NOTE — Progress Notes (Signed)
Pt. Became confused, and combative, removing his bipap and pulling at his leads. Pt was aggressive towards staff. RN notified Lenny Pastel and called respiratory to assist with putting pt back on Bipap. Pt had increased work of breathing and ABG was ordered. Results of ABG was unchanged despite being on the bipap. Rapid response was notified. Lenny Pastel at bedside, consulted PCCM. Orders to transfer were obtained. Pt transferred to [id number].

## 2015-01-10 NOTE — Progress Notes (Signed)
Utilization review completed.  

## 2015-01-10 NOTE — Progress Notes (Signed)
CRITICAL VALUE ALERT  Critical value received:  Troponin 4.22  Date of notification:  01/10/15   Time of notification:  1308  Critical value read back:Yes.    Nurse who received alert:  Ree Edman, RN  MD notified (1st page):  Dr. Vassie Loll  Time of first page:  1309   Responding MD:  Dr. Vassie Loll  Time MD responded:  [phone]

## 2015-01-10 NOTE — Progress Notes (Signed)
ANTICOAGULATION CONSULT NOTE - Initial Consult  Pharmacy Consult for Heparin Indication: chest pain/ACS  Allergies  Allergen Reactions  . Statins Other (See Comments)    Severe leg myalgias, weakness    Patient Measurements: Height:  (175.3 cm) Weight: 250 lb 3.6 oz (113.5 kg) IBW/kg (Calculated) : 70.7 Heparin Dosing Weight: 96 kg  Vital Signs: Temp: 100.8 F (38.2 C) (06/19 1244) Temp Source: Oral (06/19 1244) BP: 116/36 mmHg (06/19 1345) Pulse Rate: 68 (06/19 1345)  Labs:  Recent Labs  01/08/15 0624 01/09/15 0103 01/09/15 0121 01/10/15 1100  HGB 17.0 15.2 17.0  --   HCT 50.0 46.6 50.0  --   PLT  --  131*  --   --   CREATININE 1.70* 1.56* 1.60* 1.88*  TROPONINI  --  0.03  --  4.22*    Estimated Creatinine Clearance: 39.6 mL/min (by C-G formula based on Cr of 1.88).   Medical History: Past Medical History  Diagnosis Date  . Gout, unspecified     severe dz, "treatment done that last 15 years"  . Obesity (BMI 30-39.9)   . Restless leg syndrome   . History of retinal detachment 1994  . Hypertensive heart disease   . CAD (coronary artery disease), native coronary artery   . History of pericarditis 2007    MSSA, s/p pericardial window  . GERD   . Osteoarthritis   . Sleep apnea in adult     CPAP qhs  . TIA (transient ischemic attack) 2005  . BPH (benign prostatic hypertrophy)     "minor"  . Lumbar spinal stenosis   . Carotid artery occlusion   . Unspecified venous (peripheral) insufficiency   . Stroke May 2003  . Anxiety   . Depression   . Aortic stenosis     ECHO 05/29/14 moderate AS Mean gradient 21 mm EF 55% ECHO 07/09/13  Mean gradient 12 mm Mild to moderate AS EF 50%    . CAD (coronary artery disease)     02/11/09  Xience stent to circumflex 2.5 x 18 mm postdilated to 2.75 mm  Cath sept 2011  normal Left main, 20 % stenosis proximal LAD, 40% stenosis mid LAD, 70% stenosis proximal Diag 1, 90% stenosis prox CFX, 90% first OM, small and  nondominant RCA;   . Carotid artery disease     Prior TIA 2006 with left CEA by Dr. Arbie Cookey Recurrent TIA in April 2011   . Type 2 diabetes mellitus with vascular disease   . Lumbar disc disease   . CKD (chronic kidney disease), stage III 02/08/2014  . Hypertension   . CHF (congestive heart failure)   . Renal insufficiency     Medications:  Prescriptions prior to admission  Medication Sig Dispense Refill Last Dose  . acetaminophen (TYLENOL) 325 MG tablet Take 2 tablets (650 mg total) by mouth every 6 (six) hours as needed for mild pain or moderate pain. (Patient taking differently: Take 325 mg by mouth every 6 (six) hours as needed for mild pain or moderate pain. )   01/08/2015 at Unknown time  . allopurinol (ZYLOPRIM) 300 MG tablet Take 1 tablet (300 mg total) by mouth every morning. 90 tablet 1 01/08/2015 at Unknown time  . amLODipine (NORVASC) 5 MG tablet Take 5 mg by mouth 2 (two) times daily.   01/08/2015 at Unknown time  . aspirin EC 81 MG tablet Take 81 mg by mouth daily.   01/08/2015 at Unknown time  . buPROPion (WELLBUTRIN XL) 150 MG  24 hr tablet Take 1 tablet (150 mg total) by mouth every morning. 90 tablet 1 01/08/2015 at Unknown time  . cholecalciferol (VITAMIN D) 1000 UNITS tablet Take 1,000 Units by mouth daily.   01/08/2015 at Unknown time  . clopidogrel (PLAVIX) 75 MG tablet Take 1 tablet (75 mg total) by mouth daily. 90 tablet 3 01/08/2015 at Unknown time  . docusate sodium 100 MG CAPS Take 100 mg by mouth 2 (two) times daily. 10 capsule 0 01/08/2015 at Unknown time  . Ferrous Gluconate 256 (28 FE) MG TABS Take 256 mg by mouth daily.   01/08/2015 at Unknown time  . furosemide (LASIX) 40 MG tablet Take 1 tablet (40 mg total) by mouth daily. (Patient taking differently: Take 40 mg by mouth 2 (two) times daily. )   01/08/2015 at Unknown time  . LORazepam (ATIVAN) 1 MG tablet Take 1 tablet (1 mg total) by mouth every 8 (eight) hours as needed for anxiety or sleep. 90 tablet 3 01/08/2015 at  Unknown time  . Magnesium 250 MG TABS Take 250 mg by mouth daily.    01/08/2015 at Unknown time  . metoprolol succinate (TOPROL-XL) 50 MG 24 hr tablet Take 1 tablet (50 mg total) by mouth daily. Take with or immediately following a meal. 90 tablet 3 01/08/2015 at 0800  . mirabegron ER (MYRBETRIQ) 50 MG TB24 tablet Take 1 tablet (50 mg total) by mouth daily. 90 tablet 1 01/08/2015 at Unknown time  . mirtazapine (REMERON) 15 MG tablet Take 1 tablet (15 mg total) by mouth at bedtime. 30 tablet 11 01/08/2015 at Unknown time  . pantoprazole (PROTONIX) 40 MG tablet Take 1 tablet (40 mg total) by mouth 2 (two) times daily. 90 tablet 1 01/08/2015 at Unknown time  . pregabalin (LYRICA) 200 MG capsule Take 1 capsule (200 mg total) by mouth at bedtime. 90 capsule 1 01/08/2015 at Unknown time  . rOPINIRole (REQUIP) 4 MG tablet Take 1 tablet (4 mg total) by mouth at bedtime. 90 tablet 1 01/08/2015 at Unknown time  . sodium chloride (OCEAN) 0.65 % SOLN nasal spray Place 1 spray into both nostrils as needed for congestion.   01/08/2015 at Unknown time  . tamsulosin (FLOMAX) 0.4 MG CAPS capsule Take 1 capsule (0.4 mg total) by mouth daily. 90 capsule 3 01/08/2015 at Unknown time   Scheduled:  . allopurinol  300 mg Oral q morning - 10a  . antiseptic oral rinse  7 mL Mouth Rinse QID  . aspirin EC  81 mg Oral Daily  . atropine      . buPROPion  150 mg Oral q morning - 10a  . chlorhexidine  15 mL Mouth Rinse BID  . clopidogrel  75 mg Oral Daily  . docusate  100 mg Per Tube BID  . feeding supplement (VITAL HIGH PROTEIN)  1,000 mL Per Tube Q24H  . furosemide  60 mg Intravenous Once  . heparin  5,000 Units Subcutaneous 3 times per day  . magnesium oxide  400 mg Oral Daily  . mirabegron ER  50 mg Oral Daily  . pantoprazole (PROTONIX) IV  40 mg Intravenous Daily  . piperacillin-tazobactam (ZOSYN)  IV  3.375 g Intravenous Q8H  . pregabalin  200 mg Oral QHS  . rOPINIRole  4 mg Oral QHS  . tamsulosin  0.4 mg Oral Daily  .  [START ON 01/11/2015] vancomycin  1,250 mg Intravenous Q24H   Infusions:  . fentaNYL infusion INTRAVENOUS 25 mcg/hr (01/10/15 1145)  . phenylephrine (NEO-SYNEPHRINE)  Adult infusion 150 mcg/min (01/10/15 1118)    Assessment: 81yo male with extensive PMH and recent arteriography of RLE for venous ulcers presents with AMS and acute respiratory failure. Pharmacy is consulted to dose heparin for ACS/chest pain. BNP 285, Trop 4.22, Hgb 17, Plt 131. Patient was on prophylactic dose heparin 5000 units with last dose prior to 0600 today.  Goal of Therapy:  Heparin level 0.3-0.7 units/ml Monitor platelets by anticoagulation protocol: Yes   Plan:  Give 4000 units bolus x 1 Start heparin infusion at 1300 units/hr Check anti-Xa level in 8 hours and daily while on heparin Continue to monitor H&H and platelets  Arlean Hopping. Newman Pies, PharmD Clinical Pharmacist Pager [phone] 01/10/2015,1:52 PM

## 2015-01-11 ENCOUNTER — Encounter (HOSPITAL_COMMUNITY): Payer: Self-pay | Admitting: Vascular Surgery

## 2015-01-11 ENCOUNTER — Inpatient Hospital Stay (HOSPITAL_COMMUNITY): Payer: Medicare Other

## 2015-01-11 DIAGNOSIS — J9600 Acute respiratory failure, unspecified whether with hypoxia or hypercapnia: Secondary | ICD-10-CM | POA: Insufficient documentation

## 2015-01-11 DIAGNOSIS — I359 Nonrheumatic aortic valve disorder, unspecified: Secondary | ICD-10-CM

## 2015-01-11 DIAGNOSIS — R259 Unspecified abnormal involuntary movements: Secondary | ICD-10-CM

## 2015-01-11 LAB — GLUCOSE, CAPILLARY
GLUCOSE-CAPILLARY: 124 mg/dL — AB (ref 65–99)
GLUCOSE-CAPILLARY: 134 mg/dL — AB (ref 65–99)
Glucose-Capillary: 151 mg/dL — ABNORMAL HIGH (ref 65–99)
Glucose-Capillary: 147 mg/dL — ABNORMAL HIGH (ref 65–99)
Glucose-Capillary: 123 mg/dL — ABNORMAL HIGH (ref 65–99)
Glucose-Capillary: 121 mg/dL — ABNORMAL HIGH (ref 65–99)

## 2015-01-11 LAB — BASIC METABOLIC PANEL
ANION GAP: 8 (ref 5–15)
BUN: 28 mg/dL — ABNORMAL HIGH (ref 6–20)
CO2: 29 mmol/L (ref 22–32)
Calcium: 8.1 mg/dL — ABNORMAL LOW (ref 8.9–10.3)
Chloride: 99 mmol/L — ABNORMAL LOW (ref 101–111)
Creatinine, Ser: 1.64 mg/dL — ABNORMAL HIGH (ref 0.61–1.24)
GFR calc Af Amer: 44 mL/min — ABNORMAL LOW (ref >60)
GFR calc non Af Amer: 38 mL/min — ABNORMAL LOW (ref >60)
GLUCOSE: 165 mg/dL — AB (ref 65–99)
Potassium: 3.9 mmol/L (ref 3.5–5.1)
SODIUM: 136 mmol/L (ref 135–145)

## 2015-01-11 LAB — BLOOD GAS, ARTERIAL
Acid-Base Excess: 5.4 mmol/L — ABNORMAL HIGH (ref 0.0–2.0)
Bicarbonate: 31.6 mEq/L — ABNORMAL HIGH (ref 20.0–24.0)
Drawn by: 29017
O2 Content: 6 L/min
O2 SAT: 91.2 %
Patient temperature: 98.6
TCO2: 33.6 mmol/L (ref 0–100)
pCO2 arterial: 66.5 mmHg (ref 35.0–45.0)
pH, Arterial: 7.297 — ABNORMAL LOW (ref 7.350–7.450)
pO2, Arterial: 62.5 mmHg — ABNORMAL LOW (ref 80.0–100.0)

## 2015-01-11 LAB — HEPARIN LEVEL (UNFRACTIONATED)
HEPARIN UNFRACTIONATED: 0.28 [IU]/mL — AB (ref 0.30–0.70)
HEPARIN UNFRACTIONATED: 0.22 [IU]/mL — AB (ref 0.30–0.70)

## 2015-01-11 LAB — CBC
HCT: 41.2 % (ref 39.0–52.0)
HEMOGLOBIN: 13 g/dL (ref 13.0–17.0)
MCH: 29.1 pg (ref 26.0–34.0)
MCHC: 31.6 g/dL (ref 30.0–36.0)
MCV: 92.4 fL (ref 78.0–100.0)
Platelets: 125 10*3/uL — ABNORMAL LOW (ref 150–400)
RBC: 4.46 MIL/uL (ref 4.22–5.81)
RDW: 15.8 % — ABNORMAL HIGH (ref 11.5–15.5)
WBC: 11.7 10*3/uL — ABNORMAL HIGH (ref 4.0–10.5)

## 2015-01-11 LAB — AMMONIA: AMMONIA: 31 umol/L (ref 9–35)

## 2015-01-11 LAB — PROCALCITONIN: Procalcitonin: 0.27 ng/mL

## 2015-01-11 MED ORDER — HEPARIN BOLUS VIA INFUSION
1500.0000 [IU] | Freq: Once | INTRAVENOUS | Status: AC
  Administered 2015-01-11: 1500 [IU] via INTRAVENOUS
  Filled 2015-01-11: qty 1500

## 2015-01-11 MED ORDER — FUROSEMIDE 10 MG/ML IJ SOLN
40.0000 mg | Freq: Two times a day (BID) | INTRAMUSCULAR | Status: DC
Start: 2015-01-11 — End: 2015-01-12
  Administered 2015-01-11 – 2015-01-12 (×3): 40 mg via INTRAVENOUS
  Filled 2015-01-11 (×5): qty 4

## 2015-01-11 MED ORDER — PANTOPRAZOLE SODIUM 40 MG PO PACK
40.0000 mg | PACK | Freq: Every day | ORAL | Status: DC
  Administered 2015-01-12 – 2015-01-23 (×12): 40 mg
  Filled 2015-01-11 (×13): qty 20

## 2015-01-11 MED ORDER — ASPIRIN 81 MG PO CHEW
81.0000 mg | CHEWABLE_TABLET | Freq: Every day | ORAL | Status: DC
  Administered 2015-01-11 – 2015-01-23 (×13): 81 mg
  Filled 2015-01-11 (×14): qty 1

## 2015-01-11 MED ORDER — PERFLUTREN LIPID MICROSPHERE
INTRAVENOUS | Status: AC
  Filled 2015-01-11: qty 10

## 2015-01-11 MED ORDER — ACETAMINOPHEN 325 MG PO TABS
325.0000 mg | ORAL_TABLET | Freq: Four times a day (QID) | ORAL | Status: DC | PRN
  Administered 2015-01-11: 325 mg via ORAL
  Administered 2015-01-18 (×2): 650 mg via ORAL
  Filled 2015-01-11 (×2): qty 2

## 2015-01-11 MED ORDER — PERFLUTREN LIPID MICROSPHERE
1.0000 mL | INTRAVENOUS | Status: AC | PRN
  Administered 2015-01-11: 4 mL via INTRAVENOUS
  Filled 2015-01-11: qty 10

## 2015-01-11 MED ORDER — VITAL HIGH PROTEIN PO LIQD: 1000.0000 mL | ORAL | Status: DC

## 2015-01-11 MED ORDER — MIDAZOLAM HCL 2 MG/2ML IJ SOLN
1.0000 mg | INTRAMUSCULAR | Status: DC | PRN
  Administered 2015-01-11 – 2015-01-15 (×20): 2 mg via INTRAVENOUS
  Filled 2015-01-11 (×20): qty 2

## 2015-01-11 MED ORDER — VITAL HIGH PROTEIN PO LIQD
1000.0000 mL | ORAL | Status: DC
  Administered 2015-01-11 – 2015-01-14 (×6): 1000 mL
  Filled 2015-01-11 (×6): qty 1000

## 2015-01-11 MED FILL — Heparin Sodium (Porcine) 2 Unit/ML in Sodium Chloride 0.9%: INTRAMUSCULAR | Qty: 1000 | Status: AC

## 2015-01-11 NOTE — Progress Notes (Signed)
EEG Completed; Results Pending  

## 2015-01-11 NOTE — Procedures (Signed)
ELECTROENCEPHALOGRAM REPORT   Patient: [name]       Room #: 3A07 EEG No. ID: [id number] Age: 81 y.o.        Sex: male Referring Physician: Vassie Loll Report Date:  01/11/2015        Interpreting Physician: Thana Farr  History: [name] is an 81 y.o. male with altered mental status and respiratory distress.  Now intubated and with abnormal involuntary movements evaluated to rule out seizure.    Medications:  Scheduled: . antiseptic oral rinse  7 mL Mouth Rinse QID  . aspirin  81 mg Per Tube Daily  . chlorhexidine  15 mL Mouth Rinse BID  . clopidogrel  75 mg Oral Daily  . docusate  100 mg Per Tube BID  . feeding supplement (VITAL HIGH PROTEIN)  1,000 mL Per Tube Q24H  . furosemide  40 mg Intravenous BID  . magnesium oxide  400 mg Oral Daily  . [START ON 01/12/2015] pantoprazole sodium  40 mg Per Tube Daily  . piperacillin-tazobactam (ZOSYN)  IV  3.375 g Intravenous Q8H  . vancomycin  1,250 mg Intravenous Q24H    Conditions of Recording:  This is a 16 channel EEG carried out with the patient in the intubated and sedated state.  Prior to the recording the patient was given a bolus of Versed due to agitation.    Description:  The background activity consists of a low voltage mixture of theta and delta activity with maximum frequency at approximately 6 Hz.  This activity is at times poorly organized but for the majority of the recording is actually quite well organized.  It is diffusely distributed and continuous.  The patient was verbally stimulated by calling his name.  The patient became agitated but had no significant change in his background activity other than an increase in artifact.   No epileptiform activity is noted.   Hyperventilation and intermittent photic stimulation were not performed.   IMPRESSION: This is an abnormal electroencephalogram secondary to general background slowing.  This can be seen as a medication effect but can not rule out the possibility of  an encephalopathy, etiology nonspecific.  No epileptiform activity is noted.     Thana Farr, MD [facility] [phone] 01/11/2015, 5:24 PM

## 2015-01-11 NOTE — Progress Notes (Signed)
ANTICOAGULATION CONSULT NOTE - Follow Up Consult  Pharmacy Consult for heparin Indication: ACS   Labs:  Recent Labs  01/09/15 0103 01/09/15 0121 01/10/15 1100 01/10/15 1519 01/10/15 2240 01/11/15 0501  HGB 15.2 17.0  --   --   --  13.0  HCT 46.6 50.0  --   --   --  41.2  PLT 131*  --   --   --   --  125*  HEPARINUNFRC  --   --   --   --  0.12* 0.28*  CREATININE 1.56* 1.60* 1.88*  --   --  1.64*  TROPONINI 0.03  --  4.22* 17.13* 16.25*  --     Assessment: 81yo male remains slightly subtherapeutic on heparin after rate increase.  Goal of Therapy:  Heparin level 0.3-0.7 units/ml   Plan:  Will increase heparin gtt slightly to 1700 units/hr and check level in 8hr.  Vernard Gambles, PharmD, BCPS  01/11/2015,5:39 AM

## 2015-01-11 NOTE — Progress Notes (Signed)
ANTICOAGULATION CONSULT NOTE - Follow-up Consult  Pharmacy Consult for Heparin Indication: chest pain/ACS  Allergies  Allergen Reactions  . Statins Other (See Comments)    Severe leg myalgias, weakness    Patient Measurements: Height: 5\' 9"  (175.3 cm) Weight: 255 lb 15.3 oz (116.1 kg) IBW/kg (Calculated) : 70.7 Heparin Dosing Weight: 96 kg  Vital Signs: Temp: 98.3 F (36.8 C) (06/20 1601) Temp Source: Oral (06/20 1601) BP: 87/37 mmHg (06/20 1600) Pulse Rate: 110 (06/20 1600)  Labs:  Recent Labs  01/09/15 0103 01/09/15 0121 01/10/15 1100 01/10/15 1519 01/10/15 2240 01/11/15 0501 01/11/15 1503  HGB 15.2 17.0  --   --   --  13.0  --   HCT 46.6 50.0  --   --   --  41.2  --   PLT 131*  --   --   --   --  125*  --   HEPARINUNFRC  --   --   --   --  0.12* 0.28* 0.22*  CREATININE 1.56* 1.60* 1.88*  --   --  1.64*  --   TROPONINI 0.03  --  4.22* 17.13* 16.25*  --   --     Estimated Creatinine Clearance: 45.9 mL/min (by C-G formula based on Cr of 1.64).  Assessment: 81yo male on heparin for ACS/chest pain. Heparin level this afternoon after another rate increase was still a little low at 0.22 units/mL. No bleeding noted.  Goal of Therapy:  Heparin level 0.3-0.7 units/ml Monitor platelets by anticoagulation protocol: Yes   Plan:  Rebolus heparin 1500 units IV x1 Increase heparin gtt to 1950 units/hr 8 hr heparin level  Toby Ayad D. Mckayla Mulcahey, PharmD, BCPS Clinical Pharmacist Pager: [phone] 01/11/2015 4:57 PM

## 2015-01-11 NOTE — Consult Note (Signed)
PULMONARY / CRITICAL CARE MEDICINE   Name: [name] MRN: [id number] DOB: 1933-04-04    ADMISSION DATE:  01/09/2015 CONSULTATION DATE:  01/10/15  REFERRING MD :  Dr. Gwenlyn Perking   CHIEF COMPLAINT:  AMS, Acute Respiratory Failure   INITIAL PRESENTATION: 81 y/o M with with recent (6/17) arteriography of RLE for venous ulcers who was admitted 6/18 with a 2 week hx of SOB.  Admitted per [facility] for further evaluation.  Treated with BiPAP but failed therapy and required intubation am of 6/19.  PCCM consulted for ICU transfer.    STUDIES:  6/18  CT ABD >> post-op changes in L groin, increased density in bladder, bibasilar atx 6/18  ECHO >> LVEF 55-60%, grade II diastolic dysfunction, moderate AS (wosening since last ECHO) 6/19  CT of Head >> Stable moderate diffuse cerebral atrophy and mild chronic small vessel white matter ischemic changes  SIGNIFICANT EVENTS: 6/17  RLE arteriography per Dr. Edilia Bo for PVD and non-healing R toe wound 6/18  Admit to [facility] with complaints of SOB x 2 weeks 6/19  Decompensated early am, required intubation / ICU transfer 6/20-trop elevation, hep , asa  SUBJECTIVE: neo required  VITAL SIGNS: Temp:  [100 F (37.8 C)-102.3 F (39.1 C)] 102.3 F (39.1 C) (06/20 0816) Pulse Rate:  [54-88] 78 (06/20 0751) Resp:  [14-23] 18 (06/20 0751) BP: (51-134)/(25-95) 115/55 mmHg (06/20 0751) SpO2:  [91 %-98 %] 95 % (06/20 0751) FiO2 (%):  [50 %-80 %] 60 % (06/20 0751) Weight:  [116.1 kg (255 lb 15.3 oz)] 116.1 kg (255 lb 15.3 oz) (06/20 0436)   HEMODYNAMICS:     VENTILATOR SETTINGS: Vent Mode:  [-] PRVC FiO2 (%):  [50 %-80 %] 60 % Set Rate:  [18 bmp] 18 bmp Vt Set:  [560 mL] 560 mL PEEP:  [8 cmH20] 8 cmH20 Plateau Pressure:  [20 cmH20-123 cmH20] 22 cmH20   INTAKE / OUTPUT:  Intake/Output Summary (Last 24 hours) at 01/11/15 9147 Last data filed at 01/11/15 0700  Gross per 24 hour  Intake 2170.07 ml  Output   2375 ml  Net -204.93 ml    PHYSICAL  EXAMINATION: General:  Obese male in NAD on vent Neuro:  rass -1, agitation, nods head HEENT:  OETT Cardiovascular:  s1s2 rrr, 4-5/6 SEM heard best at 2nd ISC RSB Lungs:  ronchi  Abdomen:  Obese/soft, bsx4 active Musculoskeletal:  No acute deformities  Skin:  Warm/dry, RLE wrapped, LLE small venous ulcer on shin  LABS:  CBC  Recent Labs Lab 01/09/15 0103 01/09/15 0121 01/11/15 0501  WBC 9.7  --  11.7*  HGB 15.2 17.0 13.0  HCT 46.6 50.0 41.2  PLT 131*  --  125*   Coag's No results for input(s): APTT, INR in the last 168 hours.   BMET  Recent Labs Lab 01/09/15 0103 01/09/15 0121 01/10/15 1100 01/11/15 0501  NA 137 140 137 136  K 4.5 4.5 4.8 3.9  CL 100* 101 100* 99*  CO2 28  --  31 29  BUN 30* 34* 34* 28*  CREATININE 1.56* 1.60* 1.88* 1.64*  GLUCOSE 168* 164* 169* 165*   Electrolytes  Recent Labs Lab 01/09/15 0103 01/10/15 1100 01/11/15 0501  CALCIUM 9.1 8.2* 8.1*   Sepsis Markers  Recent Labs Lab 01/09/15 0122 01/10/15 1100 01/11/15 0501  LATICACIDVEN 2.46* 2.0  --   PROCALCITON  --  <0.10 0.27   ABG  Recent Labs Lab 01/10/15 0150 01/10/15 0602 01/10/15 1155  PHART 7.297* 7.286* 7.421  PCO2ART  66.5* 64.1* 44.4  PO2ART 62.5* 61.9* 75.0*   Liver Enzymes  Recent Labs Lab 01/09/15 0103  AST 26  ALT 17  ALKPHOS 100  BILITOT 0.7  ALBUMIN 3.5   Cardiac Enzymes  Recent Labs Lab 01/10/15 1100 01/10/15 1519 01/10/15 2240  TROPONINI 4.22* 17.13* 16.25*   Glucose  Recent Labs Lab 01/10/15 1247 01/10/15 1620 01/10/15 1940 01/11/15 0024 01/11/15 0358 01/11/15 0815  GLUCAP 141* 112* 129* 134* 151* 147*    Imaging Ct Head Wo Contrast  01/10/2015   CLINICAL DATA:  Altered mental status.  EXAM: CT HEAD WITHOUT CONTRAST  TECHNIQUE: Contiguous axial images were obtained from the base of the skull through the vertex without intravenous contrast.  COMPARISON:  07/13/2014.  FINDINGS: Diffusely enlarged ventricles and subarachnoid  spaces. Patchy white matter low density in both cerebral hemispheres. Stable small, more focal area of low density in the left frontoparietal white matter. No intracranial hemorrhage, mass lesion or CT evidence of acute infarction. Unremarkable bones and included paranasal sinuses.  IMPRESSION: 1. No acute abnormality. 2. Stable moderate diffuse cerebral atrophy and mild chronic small vessel white matter ischemic changes in both cerebral hemispheres. 3. 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   Electronically Signed   By: Beckie Salts M.D.   On: 01/10/2015 12:34   Dg Chest Port 1 View  01/11/2015   CLINICAL DATA:  Acute respiratory failure  EXAM: PORTABLE CHEST - 1 VIEW  COMPARISON:  01/10/2015; 01/09/2015; 07/17/2014  FINDINGS: Grossly unchanged borderline enlarged cardiac silhouette and mediastinal contours given persistently reduced lung volumes and patient rotation. Stable position of support apparatus. No pneumothorax. Improved aeration of the left mid and right lower lung. Improved aeration of left lower lung with residual left basilar/retrocardiac heterogeneous/consolidative opacities. No new focal airspace opacities. Persistent blunting of left costophrenic potentially indicated of a trace left-sided effusion. Pulmonary vasculature remains indistinct. Unchanged bones.  IMPRESSION: 1.  Stable positioning of support apparatus.  No pneumothorax. 2. Overall improved aeration the lungs suggests resolving edema and/or atelectasis. 3. Residual left basilar/retrocardiac opacities, atelectasis versus infiltrate. Continued attention on follow-up is recommended.   Electronically Signed   By: Simonne Come M.D.   On: 01/11/2015 07:56   Dg Abd Portable 1v  01/10/2015   CLINICAL DATA:  Orogastric tube placement  EXAM: PORTABLE ABDOMEN - 1 VIEW  COMPARISON:  CT 01/09/2015  FINDINGS: Orogastric tube tip terminates over the expected location of the distal stomach/duodenum bulb. Retained contrast  within nondilated colon. No gaseous bowel distention.  IMPRESSION: Tip of orogastric tube over the expected location of the distal stomach/duodenum bulb.   Electronically Signed   By: Christiana Pellant M.D.   On: 01/10/2015 10:05     ASSESSMENT / PLAN:  PULMONARY OETT 6/19 >> A: Acute Respiratory Failure - ? Infiltrates on CT ABD unimpressed Concern is pulm edema  From AS, ischemia Bibasilar Infiltrates - concern for HCAP  Never Smoker  OSA  P:   Neg balance remains abg reviewed, drop rate To goal 40% then peep reduction as able pcxr in am  Unimpressed infiltrates on base lungs on ct abdo   CARDIOVASCULAR CVL L IJ 6/19 >>  A:  Hypotension - concern for sepsis vs cardiogenic shock  Aortic Stenosis - worsening of valvular disease, d/w cards CAD s/p PCI (remote) PVD s/p Arteriography 6/17 per Dr. Edilia Bo  NSTEMI P:  ICU monitoring Neosynephrine (with AS) for MAP > 65, likely to dc this soon Trop to peak Heparin drip x  48 hr likely Continue ASA, Plavix  consider add statin after we investigate further myalgia etc  RENAL A:   Acute on Chronic Kidney Injury P:   Lasix to maintain neg balance  Chem in am kvo  GASTROINTESTINAL / GU  A:   GERD BPH - difficult to place catheter P:   TF to goal ppi LFt in am   HEMATOLOGIC A:  DVt prevention P:  DVT prophylaxis:  Hep drip  INFECTIOUS A:   Bibasilar Airspace Disease - r/o HCAP  Recent UTI P:   BCx2 6/19 >>  Sputum 6/19 >>  UA 6/19 >>  UC 6/19 >>  Vanco, start date 6/19, day 1/x Zosyn, start date 6/19, day 1/x   Trend PCT, may limit abx But secretions are high  ENDOCRINE A:   Mild Hyperglycemia  P:   Monitor glucose on BMP  NEUROLOGIC A:  Acute Encephalopathy Chronic facial twitch P:   RASS goal: 0 Fentanyl gtt for pain  PRN versed for sedation  CT neg eeg awaited   FAMILY  - Updates: Family updated at beside.    - Inter-disciplinary family meet or Palliative Care meeting due by: 6/27    Ccm time 30 min   Mcarthur Rossetti. Tyson Alias, MD, FACP Pgr: [phone] [facility]

## 2015-01-11 NOTE — Progress Notes (Signed)
Subjective:  He is currently intubated and unable to obtain any history.  The events of the recent chart were reviewed.  Pertinent history is that he has been admitted several times in the past year with hypercarbic respiratory failure.  He was most recently seen in the office with a weight gain as well as infection on his legs and was advised to follow-up with the wound clinic.  He also recently underwent an angiogram of his lower extremities and came in with heart failure and respiratory failure the next day.  Objective:  Vital Signs in the last 24 hours: BP 115/55 mmHg  Pulse 78  Temp(Src) 102.3 F (39.1 C) (Oral)  Resp 18  Ht 5\' 9"  (1.753 m)  Wt 116.1 kg (255 lb 15.3 oz)  BMI 37.78 kg/m2  SpO2 95%  Physical Exam: Obese somewhat plethoric male intubated and unable to obtain history Lungs:  Reduced breath sounds  Cardiac:  Regular rhythm, normal S1 and S2, no S3, harsh 2/6 holosystolic murmur Abdomen:  Soft, nontender, no masses, obese Extremities:  1-2+ edema noted, legs wrapped Intake/Output from previous day: 06/19 0701 - 06/20 0700 In: 2237.6 [I.V.:1657.2; NG/GT:450.4; IV Piggyback:50] Out: 2375 [Urine:2025; Emesis/NG output:350]  Weight Filed Weights   01/09/15 0701 01/10/15 0227 01/11/15 0436  Weight: 114.397 kg (252 lb 3.2 oz) 113.5 kg (250 lb 3.6 oz) 116.1 kg (255 lb 15.3 oz)    Lab Results: Basic Metabolic Panel:  Recent Labs  17/71/16 1100 01/11/15 0501  NA 137 136  K 4.8 3.9  CL 100* 99*  CO2 31 29  GLUCOSE 169* 165*  BUN 34* 28*  CREATININE 1.88* 1.64*   CBC:  Recent Labs  01/09/15 0103 01/09/15 0121 01/11/15 0501  WBC 9.7  --  11.7*  NEUTROABS 7.5  --   --   HGB 15.2 17.0 13.0  HCT 46.6 50.0 41.2  MCV 91.0  --  92.4  PLT 131*  --  125*   Cardiac Enzymes:  Cardiac Panel (last 3 results)  Recent Labs  01/10/15 1100 01/10/15 1519 01/10/15 2240  TROPONINI 4.22* 17.13* 16.25*    Telemetry: Sinus rhythm  Assessment/Plan:  1.  Acute  on chronic diastolic heart failure.  Unclear whether this is ischemically mediated or whether this is due to aortic stenosis or could've been worsened by the recent contrast load for angiography 2.  Aortic stenosis at least moderate and may be more severe-the aortic stenosis very well could be contributing to his repeated admissions for respiratory failure.  He has had several admissions now over the past 2 years 3.  Lower extremity wounds and cellulitis 4.  History of staphylococcal pericarditis in 2007 5.  Coronary artery disease with previous stent to the circumflex that was patent on repeat catheterization in September 2011  6.  Non-STEMI with elevation of troponin.  He could've had progression of coronary artery disease and also could have an associated with the recent stress and heart failure 7.  Acute renal failure with recent contrast exposure-appears to be getting better  Recommendations:  He is currently intubated.  At this point I would continue heparin.  Once he is extubated would likely need catheterization to determine whether he has any coronary disease associated with the troponin elevation as well as could be contributing to presentations of heart failure.  Aortic stenosis is moderately severe and the question is whether it would need to be addressed.  He think would be a poor candidate for an open procedure with his prior  history of pericarditis and might need to be considered for TAVR  [name]  MD [facility]  01/11/2015, 8:38 AM

## 2015-01-11 NOTE — Progress Notes (Signed)
Old dressing to right leg was removed and cleaned with water. Right lower anterior leg red and blanchable. Redness to right foot lateral aspect of the foot. It is blanchable and there is a 1.5 cm x 1.5 cm open wound that is red with a scant amount of serous drainage. No odor noted. Wound is about 0.1 cm deep. The left lower anterior leg is red also and is blanchable. It was cleaned with water. Bilateral legs and right foot wound wrapped in dry kerlex.

## 2015-01-11 NOTE — Progress Notes (Signed)
Initial Nutrition Assessment  DOCUMENTATION CODES:  Obesity unspecified  INTERVENTION:   Continue TF via OGT with Vital High Protein, increase to goal rate of 70 ml/h (1680 ml per day) to provide 1680 kcals, 147 gm protein, 1404 ml free water daily.  NUTRITION DIAGNOSIS:  Inadequate oral intake related to inability to eat as evidenced by NPO status.  GOAL:  Provide needs based on ASPEN/SCCM guidelines  MONITOR:  Vent status, TF tolerance, Skin, Weight trends, Labs  REASON FOR ASSESSMENT:  Consult Enteral/tube feeding initiation and management  ASSESSMENT:  Patient admitted on 6/18 with a 2 week hx of SOB. Failed BiPAP therapy and required intubation on 6/19.   Discussed patient in ICU rounds and with RN today. Nutrition focused physical exam completed.  No muscle or subcutaneous fat depletion noticed. Labs reviewed.  Patient is currently receiving Vital High Protein via OGT at 40 ml/h (960 ml/day) to provide 960 kcals, 84 gm protein, 803 ml free water daily. Received MD Consult for TF initiation and management.  Patient is currently intubated on ventilator support MV: 8.8 L/min Temp (24hrs), Avg:100.3 F (37.9 C), Min:97.3 F (36.3 C), Max:102.3 F (39.1 C)  Propofol: none  Height:  Ht Readings from Last 1 Encounters:  01/10/15 5\' 9"  (1.753 m)    Weight:  Wt Readings from Last 1 Encounters:  01/11/15 255 lb 15.3 oz (116.1 kg)    Ideal Body Weight:  72.7 kg  Wt Readings from Last 10 Encounters:  01/11/15 255 lb 15.3 oz (116.1 kg)  01/08/15 240 lb (108.863 kg)  12/23/14 247 lb 14.4 oz (112.447 kg)  09/11/14 244 lb 12.8 oz (111.041 kg)  09/10/14 246 lb 12 oz (111.925 kg)  08/27/14 235 lb (106.595 kg)  08/18/14 247 lb (112.038 kg)  08/05/14 241 lb (109.317 kg)  07/31/14 237 lb (107.502 kg)  07/17/14 236 lb 9.6 oz (107.321 kg)    BMI:  Body mass index is 37.78 kg/(m^2).  Estimated Nutritional Needs:  Kcal:  1884-1660  Protein:  145 gm  Fluid:   2 L  Skin:   right leg wound, stage II to right foot  Diet Order:  Diet NPO time specified  EDUCATION NEEDS:  No education needs identified at this time   Intake/Output Summary (Last 24 hours) at 01/11/15 1417 Last data filed at 01/11/15 1400  Gross per 24 hour  Intake 2729.55 ml  Output   2750 ml  Net -20.45 ml    Last BM:  6/19   Joaquin Courts, RD, LDN, CNSC Pager [phone] After Hours Pager [phone]

## 2015-01-11 NOTE — Progress Notes (Signed)
Pt bag/lavaged for rhonchi, desats, inc pressure support. Pt suctioned with little return and increased PEEP and FIO2.

## 2015-01-11 NOTE — Progress Notes (Signed)
NEURO HOSPITALIST PROGRESS NOTE   SUBJECTIVE:                                                                                                                        Sedated with Fentanyl, intubated on the vent, but awake. Nursing staff report some agitation and sporadic twitching overnight. CT brain personally reviewed and showed no acute abnormality. Chest x-ray shows possible pneumonia. Serologies significant for Cr 1.64, wbc 11.7, platelets 125. Had troponin 16. 25 and in on IV heparin. Although exhibiting sporadic tremor like movements arms, no frank myoclonus noted.  OBJECTIVE:                                                                                                                           Vital signs in last 24 hours: Temp:  [99.4 F (37.4 C)-101.9 F (38.8 C)] 100 F (37.8 C) (06/20 0359) Pulse Rate:  [44-114] 68 (06/20 0700) Resp:  [14-23] 20 (06/20 0700) BP: (51-134)/(25-95) 125/42 mmHg (06/20 0700) SpO2:  [91 %-100 %] 95 % (06/20 0700) FiO2 (%):  [50 %-100 %] 60 % (06/20 0530) Weight:  [116.1 kg (255 lb 15.3 oz)] 116.1 kg (255 lb 15.3 oz) (06/20 0436)  Intake/Output from previous day: 06/19 0701 - 06/20 0700 In: 2237.6 [I.V.:1657.2; NG/GT:450.4; IV Piggyback:50] Out: 2275 [Urine:1925; Emesis/NG output:350] Intake/Output this shift:   Nutritional status: Diet NPO time specified  Past Medical History  Diagnosis Date  . Gout, unspecified     severe dz, "treatment done that last 15 years"  . Obesity (BMI 30-39.9)   . Restless leg syndrome   . History of retinal detachment 1994  . Hypertensive heart disease   . CAD (coronary artery disease), native coronary artery   . History of pericarditis 2007    MSSA, s/p pericardial window  . GERD   . Osteoarthritis   . Sleep apnea in adult     CPAP qhs  . TIA (transient ischemic attack) 2005  . BPH (benign prostatic hypertrophy)     "minor"  . Lumbar spinal stenosis   .  Carotid artery occlusion   . Unspecified venous (peripheral) insufficiency   . Stroke May 2003  . Anxiety   . Depression   . Aortic  stenosis     ECHO 05/29/14 moderate AS Mean gradient 21 mm EF 55% ECHO 07/09/13  Mean gradient 12 mm Mild to moderate AS EF 50%    . CAD (coronary artery disease)     02/11/09  Xience stent to circumflex 2.5 x 18 mm postdilated to 2.75 mm  Cath sept 2011  normal Left main, 20 % stenosis proximal LAD, 40% stenosis mid LAD, 70% stenosis proximal Diag 1, 90% stenosis prox CFX, 90% first OM, small and nondominant RCA;   . Carotid artery disease     Prior TIA 2006 with left CEA by Dr. Arbie Cookey Recurrent TIA in April 2011   . Type 2 diabetes mellitus with vascular disease   . Lumbar disc disease   . CKD (chronic kidney disease), stage III 02/08/2014  . Hypertension   . CHF (congestive heart failure)   . Renal insufficiency   Physical exam:  HEENT- Normocephalic, no lesions, without obvious abnormality. Normal external eye and conjunctiva. Normal TM's bilaterally. Normal auditory canals and external ears. Normal external nose, mucus membranes and septum. Normal pharynx. Cardiovascular- S1, S2 normal, pulses palpable throughout  Lungs- chest clear, no wheezing, rales, normal symmetric air entry Abdomen- soft, non-tender; bowel sounds normal; no masses, no organomegaly Extremities- mild lower extremity edema Lymph-no adenopathy palpable Musculoskeletal-no joint tenderness, deformity or swelling Skin-dressing on RLE and wound on LLE  Neurologic Exam:  Mental Status: Sedated, intubated on the vent, awake but does not follow commands.  Cranial Nerves: II: patient does not respond confrontation bilaterally, pupils right 2 mm, left 3 mm,and reactive bilaterally III,IV,VI: doll's response absent bilaterally.  V,VII: corneal reflex reduced bilaterally  VIII: patient does not respond to verbal stimuli IX,X: gag reflex unable to be tested, XI: trapezius strength  unable to test bilaterally XII: tongue strength unable to test. Tongue juts out at times, more frequent with stimulation Motor: Extremities flaccid throughout. No spontaneous movement noted but with stimulation jerking noted throughout. No purposeful movements noted. Sensory: Does not respond to noxious stimuli in any extremity. Deep Tendon Reflexes:  1+ throughout with absent AJ's. Plantars: mute bilaterally Cerebellar: Unable to perform  Lab Results: Lab Results  Component Value Date/Time   CHOL 190 07/15/2014 03:25 AM   Lipid Panel No results for input(s): CHOL, TRIG, HDL, CHOLHDL, VLDL, LDLCALC in the last 72 hours.  Studies/Results: Ct Head Wo Contrast  01/10/2015   CLINICAL DATA:  Altered mental status.  EXAM: CT HEAD WITHOUT CONTRAST  TECHNIQUE: Contiguous axial images were obtained from the base of the skull through the vertex without intravenous contrast.  COMPARISON:  07/13/2014.  FINDINGS: Diffusely enlarged ventricles and subarachnoid spaces. Patchy white matter low density in both cerebral hemispheres. Stable small, more focal area of low density in the left frontoparietal white matter. No intracranial hemorrhage, mass lesion or CT evidence of acute infarction. Unremarkable bones and included paranasal sinuses.  IMPRESSION: 1. No acute abnormality. 2. Stable moderate diffuse cerebral atrophy and mild chronic small vessel white matter ischemic changes in both cerebral hemispheres. 3. 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   Electronically Signed   By: Beckie Salts M.D.   On: 01/10/2015 12:34   Dg Chest Port 1 View  01/10/2015   CLINICAL DATA:  Encounter for central line placement.  EXAM: PORTABLE CHEST - 1 VIEW  COMPARISON:  Same day.  FINDINGS: Stable cardiomediastinal silhouette. Endotracheal tube tip is 3.4 cm above the carina. Nasogastric tube is seen entering the stomach. No pneumothorax is noted.  There is been interval placement of left internal  jugular catheter line with distal tip in the expected position of the SVC. Minimal right basilar subsegmental atelectasis is noted. Stable lingular and left lower lobe interstitial and airspace opacity is noted concerning for edema or pneumonia.  IMPRESSION: Lingular left lower lobe interstitial and airspace opacity concerning for edema or pneumonia. Endotracheal and nasogastric tubes are in grossly good position. Interval placement of left internal jugular catheter line with distal tip overlying expected position of SVC.   Electronically Signed   By: Lupita Raider, M.D.   On: 01/10/2015 09:51   Portable Chest Xray  01/10/2015   CLINICAL DATA:  Endotracheal tube  EXAM: PORTABLE CHEST - 1 VIEW  COMPARISON:  01/09/2015  FINDINGS: Endotracheal tube tip is 3.2 cm from the carina. NG tube tip is beyond the gastroesophageal junction. Diffuse bilateral hazy airspace opacities left greater than right. Cardiomegaly. No pneumothorax.  IMPRESSION: New bilateral airspace disease left greater than right.  Endotracheal and NG tubes have been placed.  Cardiomegaly.   Electronically Signed   By: Jolaine Click M.D.   On: 01/10/2015 08:24   Dg Abd Portable 1v  01/10/2015   CLINICAL DATA:  Orogastric tube placement  EXAM: PORTABLE ABDOMEN - 1 VIEW  COMPARISON:  CT 01/09/2015  FINDINGS: Orogastric tube tip terminates over the expected location of the distal stomach/duodenum bulb. Retained contrast within nondilated colon. No gaseous bowel distention.  IMPRESSION: Tip of orogastric tube over the expected location of the distal stomach/duodenum bulb.   Electronically Signed   By: Christiana Pellant M.D.   On: 01/10/2015 10:05    MEDICATIONS                                                                                                                        Scheduled: . allopurinol  300 mg Oral q morning - 10a  . antiseptic oral rinse  7 mL Mouth Rinse QID  . aspirin EC  81 mg Oral Daily  . chlorhexidine  15 mL Mouth Rinse  BID  . clopidogrel  75 mg Oral Daily  . docusate  100 mg Per Tube BID  . feeding supplement (VITAL HIGH PROTEIN)  1,000 mL Per Tube Q24H  . furosemide  60 mg Intravenous Once  . magnesium oxide  400 mg Oral Daily  . pantoprazole (PROTONIX) IV  40 mg Intravenous Daily  . piperacillin-tazobactam (ZOSYN)  IV  3.375 g Intravenous Q8H  . rOPINIRole  4 mg Oral QHS  . tamsulosin  0.4 mg Oral Daily  . vancomycin  1,250 mg Intravenous Q24H    ASSESSMENT/PLAN:  81 year old male presenting with SOB, hypercarbic respiratory failure, and new onset of possible stimulus induced myoclonus (no noted at the moment of my assessment today). At this time doubt seizure as etiology but due suspect a toxic/metabolic etiology as source. EEG pending. Will continue to follow.  Wyatt Portela, MD [facility] [phone]  01/11/2015, 7:42 AM

## 2015-01-11 NOTE — Progress Notes (Signed)
Echocardiogram 2D Echocardiogram has been performed.  Dorothey Baseman 01/11/2015, 12:30 PM

## 2015-01-12 ENCOUNTER — Inpatient Hospital Stay (HOSPITAL_COMMUNITY): Payer: Medicare Other

## 2015-01-12 LAB — COMPREHENSIVE METABOLIC PANEL
ALT: 13 U/L — AB (ref 17–63)
AST: 24 U/L (ref 15–41)
Albumin: 2.6 g/dL — ABNORMAL LOW (ref 3.5–5.0)
Alkaline Phosphatase: 62 U/L (ref 38–126)
Anion gap: 7 (ref 5–15)
BILIRUBIN TOTAL: 0.9 mg/dL (ref 0.3–1.2)
BUN: 34 mg/dL — ABNORMAL HIGH (ref 6–20)
CALCIUM: 8.1 mg/dL — AB (ref 8.9–10.3)
CHLORIDE: 100 mmol/L — AB (ref 101–111)
CO2: 30 mmol/L (ref 22–32)
Creatinine, Ser: 1.55 mg/dL — ABNORMAL HIGH (ref 0.61–1.24)
GFR calc Af Amer: 47 mL/min — ABNORMAL LOW (ref >60)
GFR calc non Af Amer: 41 mL/min — ABNORMAL LOW (ref >60)
Glucose, Bld: 181 mg/dL — ABNORMAL HIGH (ref 65–99)
Potassium: 4.4 mmol/L (ref 3.5–5.1)
SODIUM: 137 mmol/L (ref 135–145)
Total Protein: 6.3 g/dL — ABNORMAL LOW (ref 6.5–8.1)

## 2015-01-12 LAB — HEPARIN LEVEL (UNFRACTIONATED)
Heparin Unfractionated: 0.27 IU/mL — ABNORMAL LOW (ref 0.30–0.70)
Heparin Unfractionated: 0.34 IU/mL (ref 0.30–0.70)
Heparin Unfractionated: 0.34 IU/mL (ref 0.30–0.70)

## 2015-01-12 LAB — BASIC METABOLIC PANEL
Anion gap: 6 (ref 5–15)
BUN: 40 mg/dL — AB (ref 6–20)
CHLORIDE: 102 mmol/L (ref 101–111)
CO2: 31 mmol/L (ref 22–32)
CREATININE: 1.71 mg/dL — AB (ref 0.61–1.24)
Calcium: 8.5 mg/dL — ABNORMAL LOW (ref 8.9–10.3)
GFR calc non Af Amer: 36 mL/min — ABNORMAL LOW (ref >60)
GFR, EST AFRICAN AMERICAN: 42 mL/min — AB (ref >60)
GLUCOSE: 122 mg/dL — AB (ref 65–99)
POTASSIUM: 4.7 mmol/L (ref 3.5–5.1)
Sodium: 139 mmol/L (ref 135–145)

## 2015-01-12 LAB — GLUCOSE, CAPILLARY
GLUCOSE-CAPILLARY: 164 mg/dL — AB (ref 65–99)
GLUCOSE-CAPILLARY: 131 mg/dL — AB (ref 65–99)
GLUCOSE-CAPILLARY: 147 mg/dL — AB (ref 65–99)
GLUCOSE-CAPILLARY: 153 mg/dL — AB (ref 65–99)
Glucose-Capillary: 130 mg/dL — ABNORMAL HIGH (ref 65–99)
Glucose-Capillary: 158 mg/dL — ABNORMAL HIGH (ref 65–99)

## 2015-01-12 LAB — CK: Total CK: 87 U/L (ref 49–397)

## 2015-01-12 LAB — URINE CULTURE: Culture: 20000

## 2015-01-12 LAB — CULTURE, RESPIRATORY

## 2015-01-12 LAB — MAGNESIUM: Magnesium: 2.2 mg/dL (ref 1.7–2.4)

## 2015-01-12 LAB — PHOSPHORUS: Phosphorus: 3.6 mg/dL (ref 2.5–4.6)

## 2015-01-12 LAB — CBC
HCT: 40.3 % (ref 39.0–52.0)
HEMOGLOBIN: 12.4 g/dL — AB (ref 13.0–17.0)
MCH: 29.5 pg (ref 26.0–34.0)
MCHC: 30.8 g/dL (ref 30.0–36.0)
MCV: 95.7 fL (ref 78.0–100.0)
Platelets: 106 10*3/uL — ABNORMAL LOW (ref 150–400)
RBC: 4.21 MIL/uL — AB (ref 4.22–5.81)
RDW: 16 % — ABNORMAL HIGH (ref 11.5–15.5)
WBC: 7.9 10*3/uL (ref 4.0–10.5)

## 2015-01-12 LAB — PROCALCITONIN: Procalcitonin: 0.25 ng/mL

## 2015-01-12 LAB — CULTURE, RESPIRATORY W GRAM STAIN

## 2015-01-12 MED ORDER — POLYETHYLENE GLYCOL 3350 17 G PO PACK
17.0000 g | PACK | Freq: Every day | ORAL | Status: DC
  Administered 2015-01-12 – 2015-01-16 (×5): 17 g via ORAL
  Filled 2015-01-12 (×6): qty 1

## 2015-01-12 MED ORDER — DEXMEDETOMIDINE HCL IN NACL 400 MCG/100ML IV SOLN
0.0000 ug/kg/h | INTRAVENOUS | Status: DC
  Administered 2015-01-12: 0.4 ug/kg/h via INTRAVENOUS
  Administered 2015-01-12: 1.1 ug/kg/h via INTRAVENOUS
  Administered 2015-01-12: 0.4 ug/kg/h via INTRAVENOUS
  Administered 2015-01-12 (×2): 0.6 ug/kg/h via INTRAVENOUS
  Administered 2015-01-12 – 2015-01-13 (×2): 1.1 ug/kg/h via INTRAVENOUS
  Administered 2015-01-13: 1.2 ug/kg/h via INTRAVENOUS
  Administered 2015-01-13: 1.1 ug/kg/h via INTRAVENOUS
  Filled 2015-01-12: qty 50
  Filled 2015-01-12 (×4): qty 100
  Filled 2015-01-12 (×2): qty 200

## 2015-01-12 MED ORDER — FUROSEMIDE 10 MG/ML IJ SOLN
40.0000 mg | Freq: Four times a day (QID) | INTRAMUSCULAR | Status: DC
  Administered 2015-01-12 – 2015-01-13 (×4): 40 mg via INTRAVENOUS
  Filled 2015-01-12 (×6): qty 4

## 2015-01-12 NOTE — Progress Notes (Signed)
eLink Physician-Brief Progress Note Patient Name: [name] DOB: [birth year] MRN: [id number]   Date of Service  01/12/2015  HPI/Events of Note  Issues with agitation on fentanyl gtt at 400 mcg and prn versed.  RR and HR are up.  Hypotensive by cuff pressures but making urine.  eICU Interventions  Plan: Start precedex Continue fentanyl gtt Insert aline for HD monitoring and verify BP     Intervention Category Major Interventions: Delirium, psychosis, severe agitation - evaluation and management  DETERDING,ELIZABETH 01/12/2015, 1:28 AM

## 2015-01-12 NOTE — Progress Notes (Signed)
Subjective: patient intubated on both Precedex and Fentanyl. When Precedex turned off with start to show polymyoclonus activity.   Objective: Current vital signs: BP 104/30 mmHg  Pulse 110  Temp(Src) 98 F (36.7 C) (Oral)  Resp 23  Ht  (1.753 m)  Wt 113.6 kg (250 lb 7.1 oz)  BMI 36.97 kg/m2  SpO2 92% Vital signs in last 24 hours: Temp:  [97.3 F (36.3 C)-98.9 F (37.2 C)] 98 F (36.7 C) (06/21 0809) Pulse Rate:  [60-112] 110 (06/21 0800) Resp:  [12-23] 23 (06/21 0800) BP: (83-124)/(29-85) 104/30 mmHg (06/21 0319) SpO2:  [80 %-99 %] 92 % (06/21 0800) Arterial Line BP: (101-149)/(41-69) 110/69 mmHg (06/21 0800) FiO2 (%):  [40 %-50 %] 50 % (06/21 0800) Weight:  [113.6 kg (250 lb 7.1 oz)] 113.6 kg (250 lb 7.1 oz) (06/21 0345)  Intake/Output from previous day: 06/20 0701 - 06/21 0700 In: 3662.9 [I.V.:1582.9; NG/GT:1600; IV Piggyback:400] Out: 2960 [Urine:2960] Intake/Output this shift: Total I/O In: 152.6 [I.V.:72.6; Other:10; NG/GT:70] Out: 105 [Urine:105] Nutritional status: Diet NPO time specified  Neurologic Exam: General: Mental Status: Intubated and sedated. . Cranial Nerves: XB:JYNWGN equal, round, reactive to light and accommodation III,IV, VI: ptosis not present, dolls intact V,VII: face symmetric, corneal's intact VIII: hearing normal bilaterally IX,X: uvula rises symmetrically XI: bilateral shoulder shrug XII: midline tongue extension without atrophy or fasciculations  Motor: Flaccid throughout while Precedex is on .  When turned down shows polymyoclonus--stimulus induced.  Sensory: withdraws from pain bilaterally Deep Tendon Reflexes:  1+ throughout with no AJ  Plantars: Mute bilaterally   Lab Results: Basic Metabolic Panel:  Recent Labs Lab 01/09/15 0103 01/09/15 0121 01/10/15 1100 01/11/15 0501 01/12/15 0411  NA 137 140 137 136 137  K 4.5 4.5 4.8 3.9 4.4  CL 100* 101 100* 99* 100*  CO2 28  --  GLUCOSE 168* 164* 169*  165* 181*  BUN 30* 34* 34* 28* 34*  CREATININE 1.56* 1.60* 1.88* 1.64* 1.55*  CALCIUM 9.1  --  8.2* 8.1* 8.1*    Liver Function Tests:  Recent Labs Lab 01/09/15 0103 01/12/15 0411  AST 26 24  ALT 17 13*  ALKPHOS 100 62  BILITOT 0.7 0.9  PROT 7.3 6.3*  ALBUMIN 3.5 2.6*    Recent Labs Lab 01/09/15 0103  LIPASE 26    Recent Labs Lab 01/11/15 0501  AMMONIA 31    CBC:  Recent Labs Lab 01/08/15 0624 01/09/15 0103 01/09/15 0121 01/11/15 0501 01/12/15 0411  WBC  --  9.7  --  11.7* 7.9  NEUTROABS  --  7.5  --   --   --   HGB 17.0 15.2 17.0 13.0 12.4*  HCT 50.0 46.6 50.0 41.2 40.3  MCV  --  91.0  --  92.4 95.7  PLT  --  131*  --  125* 106*    Cardiac Enzymes:  Recent Labs Lab 01/09/15 0103 01/10/15 1100 01/10/15 1519 01/10/15 2240  TROPONINI 0.03 4.22* 17.13* 16.25*    Lipid Panel: No results for input(s): CHOL, TRIG, HDL, CHOLHDL, VLDL, LDLCALC in the last 168 hours.  CBG:  Recent Labs Lab 01/11/15 1603 01/11/15 2025 01/12/15 0045 01/12/15 0402 01/12/15 0759  GLUCAP 121* 124* 158* 164* 130*    Microbiology: Results for orders placed or performed during the hospital encounter of 01/09/15  MRSA PCR Screening     Status: None   Collection Time: 01/10/15  2:31 AM  Result Value Ref Range Status   MRSA  by PCR NEGATIVE NEGATIVE Final    Comment:        The GeneXpert MRSA Assay (FDA approved for NASAL specimens only), is one component of a comprehensive MRSA colonization surveillance program. It is not intended to diagnose MRSA infection nor to guide or monitor treatment for MRSA infections.   MRSA PCR Screening     Status: None   Collection Time: 01/10/15  8:01 AM  Result Value Ref Range Status   MRSA by PCR NEGATIVE NEGATIVE Final    Comment:        The GeneXpert MRSA Assay (FDA approved for NASAL specimens only), is one component of a comprehensive MRSA colonization surveillance program. It is not intended to diagnose  MRSA infection nor to guide or monitor treatment for MRSA infections.   Culture, respiratory (NON-Expectorated)     Status: None   Collection Time: 01/10/15  9:29 AM  Result Value Ref Range Status   Specimen Description TRACHEAL ASPIRATE  Final   Special Requests NONE  Final   Gram Stain   Final    RARE WBC PRESENT, PREDOMINANTLY PMN RARE SQUAMOUS EPITHELIAL CELLS PRESENT FEW GRAM POSITIVE COCCI IN PAIRS FEW GRAM POSITIVE RODS Performed at [facility]    Culture   Final    Non-Pathogenic Oropharyngeal-type Flora Isolated. Performed at [facility]    Report Status 01/12/2015 FINAL  Final  Culture, Urine     Status: None   Collection Time: 01/10/15 10:21 AM  Result Value Ref Range Status   Specimen Description URINE, CATHETERIZED  Final   Special Requests NONE  Final   Culture 20,000 COLONIES/mL PSEUDOMONAS AERUGINOSA  Final   Report Status 01/12/2015 FINAL  Final   Organism ID, Bacteria PSEUDOMONAS AERUGINOSA  Final      Susceptibility   Pseudomonas aeruginosa - MIC*    CEFTAZIDIME <=1 SENSITIVE Sensitive     CIPROFLOXACIN <=0.25 SENSITIVE Sensitive     GENTAMICIN <=1 SENSITIVE Sensitive     IMIPENEM 2 SENSITIVE Sensitive     PIP/TAZO <=4 SENSITIVE Sensitive     CEFEPIME <=1 SENSITIVE Sensitive     * 20,000 COLONIES/mL PSEUDOMONAS AERUGINOSA    Coagulation Studies: No results for input(s): LABPROT, INR in the last 72 hours.  Imaging: Ct Head Wo Contrast  01/10/2015   CLINICAL DATA:  Altered mental status.  EXAM: CT HEAD WITHOUT CONTRAST  TECHNIQUE: Contiguous axial images were obtained from the base of the skull through the vertex without intravenous contrast.  COMPARISON:  07/13/2014.  FINDINGS: Diffusely enlarged ventricles and subarachnoid spaces. Patchy white matter low density in both cerebral hemispheres. Stable small, more focal area of low density in the left frontoparietal white matter. No intracranial hemorrhage, mass lesion or CT evidence of  acute infarction. Unremarkable bones and included paranasal sinuses.  IMPRESSION: 1. No acute abnormality. 2. Stable moderate diffuse cerebral atrophy and mild chronic small vessel white matter ischemic changes in both cerebral hemispheres. 3. 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   Electronically Signed   By: Beckie Salts M.D.   On: 01/10/2015 12:34   Dg Chest Port 1 View  01/12/2015   CLINICAL DATA:  Myocardial infarct.  EXAM: PORTABLE CHEST - 1 VIEW  COMPARISON:  01/11/2015.  FINDINGS: Endotracheal tube, left IJ line, NG tube in stable position. Cardiomegaly with pulmonary vascular prominence and diffuse interstitial prominence consistent with congestive heart failure. Small pleural effusions cannot be excluded. Low lung volumes with basilar atelectasis. No pneumothorax.  IMPRESSION: 1. Lines  and tubes in stable position. 2. Cardiomegaly with pulmonary venous congestion bilateral interstitial prominence. Small pleural effusions cannot be excluded. These findings are consistent with congestive heart failure. 3. Low lung volumes with basilar atelectasis.   Electronically Signed   By: Maisie Fus  Register   On: 01/12/2015 07:22   Dg Chest Port 1 View  01/11/2015   CLINICAL DATA:  Acute respiratory failure  EXAM: PORTABLE CHEST - 1 VIEW  COMPARISON:  01/10/2015; 01/09/2015; 07/17/2014  FINDINGS: Grossly unchanged borderline enlarged cardiac silhouette and mediastinal contours given persistently reduced lung volumes and patient rotation. Stable position of support apparatus. No pneumothorax. Improved aeration of the left mid and right lower lung. Improved aeration of left lower lung with residual left basilar/retrocardiac heterogeneous/consolidative opacities. No new focal airspace opacities. Persistent blunting of left costophrenic potentially indicated of a trace left-sided effusion. Pulmonary vasculature remains indistinct. Unchanged bones.  IMPRESSION: 1.  Stable positioning of support  apparatus.  No pneumothorax. 2. Overall improved aeration the lungs suggests resolving edema and/or atelectasis. 3. Residual left basilar/retrocardiac opacities, atelectasis versus infiltrate. Continued attention on follow-up is recommended.   Electronically Signed   By: Simonne Come M.D.   On: 01/11/2015 07:56    Medications:  Scheduled: . antiseptic oral rinse  7 mL Mouth Rinse QID  . aspirin  81 mg Per Tube Daily  . chlorhexidine  15 mL Mouth Rinse BID  . clopidogrel  75 mg Oral Daily  . docusate  100 mg Per Tube BID  . feeding supplement (VITAL HIGH PROTEIN)  1,000 mL Per Tube Q24H  . furosemide  40 mg Intravenous Q6H  . magnesium oxide  400 mg Oral Daily  . pantoprazole sodium  40 mg Per Tube Daily  . piperacillin-tazobactam (ZOSYN)  IV  3.375 g Intravenous Q8H  . polyethylene glycol  17 g Oral Daily  . vancomycin  1,250 mg Intravenous Q24H   EEG: Conditions of Recording: This is a 16 channel EEG carried out with the patient in the intubated and sedated state. Prior to the recording the patient was given a bolus of Versed due to agitation.   Description: The background activity consists of a low voltage mixture of theta and delta activity with maximum frequency at approximately 6 Hz. This activity is at times poorly organized but for the majority of the recording is actually quite well organized. It is diffusely distributed and continuous.  The patient was verbally stimulated by calling his name. The patient became agitated but had no significant change in his background activity other than an increase in artifact.  No epileptiform activity is noted.  Hyperventilation and intermittent photic stimulation were not performed.   IMPRESSION: This is an abnormal electroencephalogram secondary to general background slowing. This can be seen as a medication effect but can not rule out the possibility of an encephalopathy, etiology nonspecific. No epileptiform activity is noted.    Assessment/Plan: 81 year old male presenting with SOB, hypercarbic respiratory failure, and new onset of possible stimulus induced myoclonus--continues to show myoclonus when stimulated. EEG shows no seizure activity.  Suspect myoclonus secondary to hypoxia and toxic/metabolic source. At this time would continue to treat underlying issues.  Neurology will S/O--please call with further questions.     Felicie Morn PA-C [facility] [phone]  01/12/2015, 10:27 AM Patient seen and examined together with physician assistant and I concur with the assessment and plan.  Wyatt Portela, MD

## 2015-01-12 NOTE — Progress Notes (Signed)
eLink Physician-Brief Progress Note Patient Name: [name] DOB: 02-13-1933 MRN: [id number]   Date of Service  01/12/2015  HPI/Events of Note  In order for patient to be adequately sedated on vent now with hypotension with BP 96/35 (49)  eICU Interventions  Plan: Titate up NEO for BP support Goal MAP of 60     Intervention Category Intermediate Interventions: Hypotension - evaluation and management  DETERDING,ELIZABETH 01/12/2015, 1:04 AM

## 2015-01-12 NOTE — Progress Notes (Signed)
ANTICOAGULATION CONSULT NOTE - Follow Up Consult  Pharmacy Consult for heparin Indication: ACS   Labs:  Recent Labs  01/10/15 1100 01/10/15 1519  01/10/15 2240 01/11/15 0501 01/11/15 1503 01/12/15 0045  HGB  --   --   --   --  13.0  --   --   HCT  --   --   --   --  41.2  --   --   PLT  --   --   --   --  125*  --   --   HEPARINUNFRC  --   --   < > 0.12* 0.28* 0.22* 0.27*  CREATININE 1.88*  --   --   --  1.64*  --   --   TROPONINI 4.22* 17.13*  --  16.25*  --   --   --   < > = values in this interval not displayed.   Assessment: 81yo male remains slightly subtherapeutic on heparin after rate increase.  Goal of Therapy:  Heparin level 0.3-0.7 units/ml   Plan:  Will increase heparin gtt by 1 unit/kg/hr to 2100 units/hr and check level in 8hr.  Vernard Gambles, PharmD, BCPS  01/12/2015,1:48 AM

## 2015-01-12 NOTE — Progress Notes (Signed)
ANTICOAGULATION CONSULT NOTE - Follow Up Consult  Pharmacy Consult for heparin Indication: ACS Heparin dosing weight = 96 kg  Labs:  Recent Labs  01/10/15 1100 01/10/15 1519  01/10/15 2240 01/11/15 0501 01/11/15 1503 01/12/15 0045 01/12/15 0411 01/12/15 0927 01/12/15 0936  HGB  --   --   --   --  13.0  --   --  12.4*  --   --   HCT  --   --   --   --  41.2  --   --  40.3  --   --   PLT  --   --   --   --  125*  --   --  106*  --   --   HEPARINUNFRC  --   --   < > 0.12* 0.28* 0.22* 0.27*  --   --  0.34  CREATININE 1.88*  --   --   --  1.64*  --   --  1.55*  --   --   CKTOTAL  --   --   --   --   --   --   --   --  87  --   TROPONINI 4.22* 17.13*  --  16.25*  --   --   --   --   --   --   < > = values in this interval not displayed.   Assessment: 81yo male remains on IV heparin for ACS (Trop up to 16.25). Heparin level this AM after rate increase is therapeutic at low end of goal. Platelets are trending down (131 admit >>125 >>106) but not 50% drop. Hgb trending down at 12.4. No bleeding reported.   Goal of Therapy:  Heparin level 0.3-0.7 units/ml  Monitor platelets.    Plan:  Increase heparin to 2150 units/hr to keep in range.  Recheck heparin level in 8 hours to confirm. Monitor platelets closely and for signs and symptoms of bleeding.    Link Snuffer, PharmD, BCPS Clinical Pharmacist [phone] 01/12/2015,11:46 AM

## 2015-01-12 NOTE — Procedures (Signed)
Arterial Catheter Insertion Procedure Note [name] [id number] 01-01-1933  Procedure: Insertion of Arterial Catheter  Indications: Blood pressure monitoring  Procedure Details Consent: Unable to obtain consent because of altered level of consciousness. Time Out: Verified patient identification, verified procedure, site/side was marked, verified correct patient position, special equipment/implants available, medications/allergies/relevent history reviewed, required imaging and test results available.  Performed  Maximum sterile technique was used including antiseptics, cap, gloves, gown, hand hygiene, mask and sheet. Skin prep: Chlorhexidine; local anesthetic administered 20 gauge catheter was inserted into right radial artery using the Seldinger technique.  Evaluation Blood flow good; BP tracing good. Complications: No apparent complications  Placed arterial line per Dr order.[name] [name] 01/12/2015

## 2015-01-12 NOTE — Progress Notes (Signed)
ANTICOAGULATION CONSULT NOTE - Follow Up Consult  Pharmacy Consult for heparin Indication: ACS Heparin dosing weight = 96 kg  Labs:  Recent Labs  01/10/15 1100 01/10/15 1519  01/10/15 2240 01/11/15 0501  01/12/15 0045 01/12/15 0411 01/12/15 0927 01/12/15 0936 01/12/15 1431 01/12/15 2140  HGB  --   --   --   --  13.0  --   --  12.4*  --   --   --   --   HCT  --   --   --   --  41.2  --   --  40.3  --   --   --   --   PLT  --   --   --   --  125*  --   --  106*  --   --   --   --   HEPARINUNFRC  --   --   < > 0.12* 0.28*  < > 0.27*  --   --  0.34  --  0.34  CREATININE 1.88*  --   --   --  1.64*  --   --  1.55*  --   --  1.71*  --   CKTOTAL  --   --   --   --   --   --   --   --  87  --   --   --   TROPONINI 4.22* 17.13*  --  16.25*  --   --   --   --   --   --   --   --   < > = values in this interval not displayed.   Assessment: 81yo male remains on IV heparin for ACS (Trop up to 16.25). Heparin level this pm continues to be at goal.   Repeat CBC was ordered but cancelled by lab? Will not stick patient again tonight, follow up am labs.  Goal of Therapy:  Heparin level 0.3-0.7 units/ml  Monitor platelets.    Plan:  Continue heparin at 2150 units/hr to keep in range.  Daily HL/CBC Monitor platelets closely and for signs and symptoms of bleeding.    Sheppard Coil PharmD., BCPS Clinical Pharmacist Pager [phone] 01/12/2015 10:24 PM

## 2015-01-12 NOTE — Progress Notes (Signed)
Subjective:  Remains intubated and sedated.  Agitated overnight requiring sedation.  EEG done..  Objective:  Vital Signs in the last 24 hours: BP 104/30 mmHg  Pulse 89  Temp(Src) 98 F (36.7 C) (Oral)  Resp 14  Ht 5\' 9"  (1.753 m)  Wt 113.6 kg (250 lb 7.1 oz)  BMI 36.97 kg/m2  SpO2 93%  Physical Exam: Obese somewhat plethoric male intubated and unable to obtain history Lungs:  Reduced breath sounds  Cardiac:  Regular rhythm, normal S1 and S2, no S3, harsh 3/6 holosystolic murmur Abdomen:  Soft, nontender, no masses, obese Extremities: trace+ edema noted, legs wrapped legs look much better than when last seen in office  Intake/Output from previous day: 06/20 0701 - 06/21 0700 In: 3557.9 [I.V.:1477.9; NG/GT:1600; IV Piggyback:400] Out: 2960 [Urine:2960]  Weight Filed Weights   01/10/15 0227 01/11/15 0436 01/12/15 0345  Weight: 113.5 kg (250 lb 3.6 oz) 116.1 kg (255 lb 15.3 oz) 113.6 kg (250 lb 7.1 oz)    Lab Results: Basic Metabolic Panel:  Recent Labs  11/73/56 0501 01/12/15 0411  NA 136 137  K 3.9 4.4  CL 99* 100*  CO2 29 30  GLUCOSE 165* 181*  BUN 28* 34*  CREATININE 1.64* 1.55*   CBC:  Recent Labs  01/11/15 0501 01/12/15 0411  WBC 11.7* 7.9  HGB 13.0 12.4*  HCT 41.2 40.3  MCV 92.4 95.7  PLT 125* 106*   Cardiac Enzymes:  Cardiac Panel (last 3 results)  Recent Labs  01/10/15 1100 01/10/15 1519 01/10/15 2240  TROPONINI 4.22* 17.13* 16.25*    Telemetry: Sinus rhythm  Assessment/Plan:  1.  Acute on chronic diastolic heart failure.  Unclear whether this is ischemically mediated or whether this is due to aortic stenosis or could've been worsened by the recent contrast load for angiography 2.  Aortic stenosis at least moderate and may be more severe-the aortic stenosis very well could be contributing to his repeated admissions for respiratory failure.  He has had several admissions now over the past 2 years 3.  Non-STEMI with elevation of  troponin.  He could've had progression of coronary artery disease and also could have an associated with the recent stress and heart failure cath planned when stable 4.  Acute renal failure with recent contrast exposure-improving 5. Mild thrombocytopenia  Recommendations:  Continue heparin for now.  Awaiting improvement in mental status and extubation prior to proceeding with cath.   Darden Palmer  MD [facility]  01/12/2015, 8:21 AM

## 2015-01-12 NOTE — Progress Notes (Signed)
PULMONARY / CRITICAL CARE MEDICINE   Name: [name] MRN: [id number] DOB: 1933/05/26    ADMISSION DATE:  01/09/2015 CONSULTATION DATE:  01/10/15  REFERRING MD :  Dr. Gwenlyn Perking   CHIEF COMPLAINT:  AMS, Acute Respiratory Failure   INITIAL PRESENTATION: 81 y/o M with with recent (6/17) arteriography of RLE for venous ulcers who was admitted 6/18 with a 2 week hx of SOB.  Admitted per [facility] for further evaluation.  Treated with BiPAP but failed therapy and required intubation am of 6/19.  PCCM consulted for ICU transfer.    STUDIES:  6/18  CT ABD >> post-op changes in L groin, increased density in bladder, bibasilar atx 6/18  ECHO >> LVEF 55-60%, grade II diastolic dysfunction, moderate AS (wosening since last ECHO) 6/19 EEG>>>metabolic vs drug affect, no focus 6/19  CT of Head >> Stable moderate diffuse cerebral atrophy and mild chronic small vessel white matter ischemic changes  SIGNIFICANT EVENTS: 6/17  RLE arteriography per Dr. Edilia Bo for PVD and non-healing R toe wound 6/18  Admit to [facility] with complaints of SOB x 2 weeks 6/19  Decompensated early am, required intubation / ICU transfer 6/20-trop elevation, hep , asa  SUBJECTIVE: neo off, pos 400 cc overnight  VITAL SIGNS: Temp:  [97.3 F (36.3 C)-98.9 F (37.2 C)] 98 F (36.7 C) (06/21 0809) Pulse Rate:  [60-112] 110 (06/21 0800) Resp:  [12-23] 23 (06/21 0800) BP: (83-130)/(29-85) 104/30 mmHg (06/21 0319) SpO2:  [80 %-99 %] 92 % (06/21 0800) Arterial Line BP: (101-149)/(41-69) 110/69 mmHg (06/21 0800) FiO2 (%):  [40 %-50 %] 50 % (06/21 0800) Weight:  [113.6 kg (250 lb 7.1 oz)] 113.6 kg (250 lb 7.1 oz) (06/21 0345)   HEMODYNAMICS:     VENTILATOR SETTINGS: Vent Mode:  [-] PRVC FiO2 (%):  [40 %-50 %] 50 % Set Rate:  [12 bmp] 12 bmp Vt Set:  [560 mL] 560 mL PEEP:  [5 cmH20-8 cmH20] 8 cmH20 Plateau Pressure:  [23 cmH20-26 cmH20] 26 cmH20   INTAKE / OUTPUT:  Intake/Output Summary (Last 24 hours) at 01/12/15 0853 Last  data filed at 01/12/15 0800  Gross per 24 hour  Intake 3725.51 ml  Output   2990 ml  Net 735.51 ml    PHYSICAL EXAMINATION: General:  Obese male in NAD on vent Neuro:  rass -3 HEENT:  OETT Cardiovascular:  s1s2 rrr, 4-5/6 SEM heard best at 2nd ISC RSB Lungs:  ronchi diffuse bilateral Abdomen:  Obese/soft, bsx4 active, mild distention, no BM noted Musculoskeletal:  No acute deformities  Skin:  Warm/dry, RLE wrapped, LLE small venous ulcer on shin  LABS:  CBC  Recent Labs Lab 01/09/15 0103 01/09/15 0121 01/11/15 0501 01/12/15 0411  WBC 9.7  --  11.7* 7.9  HGB 15.2 17.0 13.0 12.4*  HCT 46.6 50.0 41.2 40.3  PLT 131*  --  125* 106*   Coag's No results for input(s): APTT, INR in the last 168 hours.   BMET  Recent Labs Lab 01/10/15 1100 01/11/15 0501 01/12/15 0411  NA 137 136 137  K 4.8 3.9 4.4  CL 100* 99* 100*  CO2 31 29 30   BUN 34* 28* 34*  CREATININE 1.88* 1.64* 1.55*  GLUCOSE 169* 165* 181*   Electrolytes  Recent Labs Lab 01/10/15 1100 01/11/15 0501 01/12/15 0411  CALCIUM 8.2* 8.1* 8.1*   Sepsis Markers  Recent Labs Lab 01/09/15 0122 01/10/15 1100 01/11/15 0501 01/12/15 0411  LATICACIDVEN 2.46* 2.0  --   --   PROCALCITON  --  <  0.10 0.27 0.25   ABG  Recent Labs Lab 01/10/15 0150 01/10/15 0602 01/10/15 1155  PHART 7.297* 7.286* 7.421  PCO2ART 66.5* 64.1* 44.4  PO2ART 62.5* 61.9* 75.0*   Liver Enzymes  Recent Labs Lab 01/09/15 0103 01/12/15 0411  AST 26 24  ALT 17 13*  ALKPHOS 100 62  BILITOT 0.7 0.9  ALBUMIN 3.5 2.6*   Cardiac Enzymes  Recent Labs Lab 01/10/15 1100 01/10/15 1519 01/10/15 2240  TROPONINI 4.22* 17.13* 16.25*   Glucose  Recent Labs Lab 01/11/15 0815 01/11/15 1143 01/11/15 1603 01/11/15 2025 01/12/15 0045 01/12/15 0402  GLUCAP 147* 123* 121* 124* 158* 164*    Imaging Dg Chest Port 1 View  01/12/2015   CLINICAL DATA:  Myocardial infarct.  EXAM: PORTABLE CHEST - 1 VIEW  COMPARISON:  01/11/2015.   FINDINGS: Endotracheal tube, left IJ line, NG tube in stable position. Cardiomegaly with pulmonary vascular prominence and diffuse interstitial prominence consistent with congestive heart failure. Small pleural effusions cannot be excluded. Low lung volumes with basilar atelectasis. No pneumothorax.  IMPRESSION: 1. Lines and tubes in stable position. 2. Cardiomegaly with pulmonary venous congestion bilateral interstitial prominence. Small pleural effusions cannot be excluded. These findings are consistent with congestive heart failure. 3. Low lung volumes with basilar atelectasis.   Electronically Signed   By: Maisie Fus  Register   On: 01/12/2015 07:22     ASSESSMENT / PLAN:  PULMONARY OETT 6/19 >> A: Acute Respiratory Failure - ? Infiltrates on CT ABD unimpressed Concern is pulm edema  From AS, ischemia Increased bilateral diffuse patchy opacifiction - concern for HCAP  Never Smoker  OSA  P:   -Daily wakup -VAP protocol -continue diuresis as BP and SCr tolerate -To goal 40% then peep reduction as able - Daily CXR -wean SBT cpap 5 ps 15 required, but need rass increase will improve  CARDIOVASCULAR CVL L IJ 6/19 >>  A:  Hypotension - concern for sepsis vs cardiogenic shock  Aortic Stenosis - worsening of valvular disease, d/w cards CAD s/p PCI (remote) PVD s/p Arteriography 6/17 per Dr. Edilia Bo  NSTEMI P:  -ICU monitoring -Neosynephrine off  -Trop to peak-6/19 17.13 -Heparin drip x 48 hr, re assess to dc in am  -Continue ASA, Plavix  -consider add statin after we investigate further myalgia etc-CK  RENAL A:   Acute on Chronic Kidney Injury P:   -Continue Lasix as BP and SCr tolerate -Goal 1to2 Liter negative., need to increase, may need drip if BP drops with bolus increased dose -Chem in am -kvo  GASTROINTESTINAL / GU  A:   GERD BPH - difficult to place catheter Constipation P:   -TF to goal -ppi - add miralax, if NO BM, then add dulc  supp -colace  HEMATOLOGIC A:  DVt prevention P:  DVT prophylaxis:  Hep drip continue for next 24 hr likely Cbc in am for plat trend  INFECTIOUS A:   Bibasilar Airspace Disease - r/o HCAP  Recent UTI Fever noted  P:   BCx2 6/19 >> No blood cx collected  Sputum 6/19 >> Likely OPF UA 6/19 >>  UC 6/19 >>Pseudomonas Aeruginosa 20 K (doubt pathogen)  Vanco, start date 6/19>>> Zosyn, start date 6/19>>>   Trend PCT, may limit abx BC if spike again Pct unimpressive, secretions better, in am will narrow   ENDOCRINE A:   Mild Hyperglycemia  P:   Monitor glucose on BMP  NEUROLOGIC A:  Acute Encephalopathy- EEG: med effect vs encephalopathy Chronic facial twitch P:  RASS goal: 0 Fentanyl gtt for pain, WUA  PRN versed for sedation  CT neg, eeg neg focus   FAMILY  - Updates: Family updated at beside.    - Inter-disciplinary family meet or Palliative Care meeting due by: 6/27     STAFF NOTE: I, Rory Percy, MD FACP have personally reviewed patient's available data, including medical history, events of note, physical examination and test results as part of my evaluation. I have discussed with resident/NP and other care providers such as pharmacist, RN and RRT. In addition, I personally evaluated patient and elicited key findings of: less secretions, coarse BS, oversedated this am , rass -3, need wua, re assess weaning and lower PS, need to escalate lasix to neg balance goals, crt has improved overall with lasix, upright as able, adding meds for BM, abx to not change for now, had fever although not impressed for PNA, neo in future if drops BP The patient is critically ill with multiple organ systems failure and requires high complexity decision making for assessment and support, frequent evaluation and titration of therapies, application of advanced monitoring technologies and extensive interpretation of multiple databases.   Critical Care Time devoted to patient care  services described in this note is 30  Minutes. This time reflects time of care of this signee: Rory Percy, MD FACP. This critical care time does not reflect procedure time, or teaching time or supervisory time of PA/NP/Med student/Med Resident etc but could involve care discussion time. Rest per NP/medical resident whose note is outlined above and that I agree with   [name]. Mason Alias, MD, FACP Pgr: [phone] [facility] 01/12/2015 9:46 AM

## 2015-01-13 ENCOUNTER — Inpatient Hospital Stay (HOSPITAL_COMMUNITY): Payer: Medicare Other

## 2015-01-13 ENCOUNTER — Encounter: Payer: Self-pay | Admitting: Vascular Surgery

## 2015-01-13 LAB — TRIGLYCERIDES: Triglycerides: 198 mg/dL — ABNORMAL HIGH (ref <150)

## 2015-01-13 LAB — BASIC METABOLIC PANEL
ANION GAP: 9 (ref 5–15)
BUN: 45 mg/dL — AB (ref 6–20)
CALCIUM: 8.5 mg/dL — AB (ref 8.9–10.3)
CO2: 33 mmol/L — ABNORMAL HIGH (ref 22–32)
CREATININE: 1.83 mg/dL — AB (ref 0.61–1.24)
Chloride: 98 mmol/L — ABNORMAL LOW (ref 101–111)
GFR, EST AFRICAN AMERICAN: 39 mL/min — AB (ref >60)
GFR, EST NON AFRICAN AMERICAN: 33 mL/min — AB (ref >60)
Glucose, Bld: 184 mg/dL — ABNORMAL HIGH (ref 65–99)
Potassium: 4.1 mmol/L (ref 3.5–5.1)
Sodium: 140 mmol/L (ref 135–145)

## 2015-01-13 LAB — CBC
HCT: 41.3 % (ref 39.0–52.0)
HEMOGLOBIN: 12.6 g/dL — AB (ref 13.0–17.0)
MCH: 29 pg (ref 26.0–34.0)
MCHC: 30.5 g/dL (ref 30.0–36.0)
MCV: 94.9 fL (ref 78.0–100.0)
PLATELETS: 93 10*3/uL — AB (ref 150–400)
RBC: 4.35 MIL/uL (ref 4.22–5.81)
RDW: 15.6 % — AB (ref 11.5–15.5)
WBC: 5.5 10*3/uL (ref 4.0–10.5)

## 2015-01-13 LAB — HEPARIN LEVEL (UNFRACTIONATED): HEPARIN UNFRACTIONATED: 0.36 [IU]/mL (ref 0.30–0.70)

## 2015-01-13 LAB — GLUCOSE, CAPILLARY
GLUCOSE-CAPILLARY: 152 mg/dL — AB (ref 65–99)
Glucose-Capillary: 112 mg/dL — ABNORMAL HIGH (ref 65–99)
Glucose-Capillary: 137 mg/dL — ABNORMAL HIGH (ref 65–99)
Glucose-Capillary: 145 mg/dL — ABNORMAL HIGH (ref 65–99)
Glucose-Capillary: 146 mg/dL — ABNORMAL HIGH (ref 65–99)
Glucose-Capillary: 150 mg/dL — ABNORMAL HIGH (ref 65–99)

## 2015-01-13 LAB — CK: CK TOTAL: 60 U/L (ref 49–397)

## 2015-01-13 MED ORDER — PROPOFOL 1000 MG/100ML IV EMUL
INTRAVENOUS | Status: AC
  Filled 2015-01-13: qty 100

## 2015-01-13 MED ORDER — BISACODYL 10 MG RE SUPP
10.0000 mg | Freq: Once | RECTAL | Status: AC
  Administered 2015-01-13: 10 mg via RECTAL
  Filled 2015-01-13: qty 1

## 2015-01-13 MED ORDER — CEFTRIAXONE SODIUM IN DEXTROSE 20 MG/ML IV SOLN
1.0000 g | INTRAVENOUS | Status: AC
  Administered 2015-01-13 – 2015-01-16 (×4): 1 g via INTRAVENOUS
  Filled 2015-01-13 (×4): qty 50

## 2015-01-13 MED ORDER — PROPOFOL 1000 MG/100ML IV EMUL
0.0000 ug/kg/min | INTRAVENOUS | Status: DC
  Administered 2015-01-13 (×2): 30 ug/kg/min via INTRAVENOUS
  Administered 2015-01-14 – 2015-01-15 (×8): 40 ug/kg/min via INTRAVENOUS
  Administered 2015-01-15: 45 ug/kg/min via INTRAVENOUS
  Administered 2015-01-15: 35 ug/kg/min via INTRAVENOUS
  Administered 2015-01-15 (×2): 40 ug/kg/min via INTRAVENOUS
  Administered 2015-01-15: 30 ug/kg/min via INTRAVENOUS
  Administered 2015-01-15 – 2015-01-16 (×2): 40 ug/kg/min via INTRAVENOUS
  Filled 2015-01-13 (×3): qty 200
  Filled 2015-01-13 (×9): qty 100
  Filled 2015-01-13: qty 200
  Filled 2015-01-13 (×2): qty 100

## 2015-01-13 MED ORDER — PROPOFOL 1000 MG/100ML IV EMUL: 5.0000 ug/kg/min | INTRAVENOUS | Status: DC

## 2015-01-13 NOTE — Progress Notes (Addendum)
Subjective:  Remains intubated and sedated.  Agitated when sedation turned down.   Objective:  Vital Signs in the last 24 hours: BP 115/46 mmHg  Pulse 68  Temp(Src) 98.4 F (36.9 C) (Oral)  Resp 13  Ht 5\' 9"  (1.753 m)  Wt 114.5 kg (252 lb 6.8 oz)  BMI 37.26 kg/m2  SpO2 92%  Physical Exam: Obese somewhat plethoric male intubated and unable to obtain history Lungs:  Reduced breath sounds  Cardiac:  Regular rhythm, normal S1 and S2, no S3, harsh 3/6 holosystolic murmur Abdomen:  Soft, nontender, no masses, obese Extremities: trace+ edema noted, dry ulcer on tip of toe on right.  Intake/Output from previous day: 06/21 0701 - 06/22 0700 In: 4416.8 [I.V.:2076.8; NG/GT:1890; IV Piggyback:400] Out: 3275 [Urine:3275]  Weight Filed Weights   01/11/15 0436 01/12/15 0345 01/13/15 0438  Weight: 116.1 kg (255 lb 15.3 oz) 113.6 kg (250 lb 7.1 oz) 114.5 kg (252 lb 6.8 oz)    Lab Results: Basic Metabolic Panel:  Recent Labs  13/24/40 1431 01/13/15 0450  NA 139 140  K 4.7 4.1  CL 102 98*  CO2 31 33*  GLUCOSE 122* 184*  BUN 40* 45*  CREATININE 1.71* 1.83*   CBC:  Recent Labs  01/12/15 0411 01/13/15 0450  WBC 7.9 5.5  HGB 12.4* 12.6*  HCT 40.3 41.3  MCV 95.7 94.9  PLT 106* 93*   Cardiac Enzymes:  Cardiac Panel (last 3 results)  Recent Labs  01/10/15 1100 01/10/15 1519 01/10/15 2240 01/12/15 0927  CKTOTAL  --   --   --  87  TROPONINI 4.22* 17.13* 16.25*  --     Telemetry: Sinus rhythm  Assessment/Plan:  1.  Acute on chronic diastolic heart failure. Suspect volume status closer to baseline now and with rising creatinine may need to back off on diuresis. 2.  Aortic stenosis at least moderate and may be more severe-the aortic stenosis very well could be contributing to his repeated admissions for respiratory failure.  He has had several admissions now over the past 2 years 3.  Non-STEMI with elevation of troponin.  cath planned when stable 4.  Acute renal  failure with recent contrast exposure as well as diuresis.  Suspect may be somewhat dry now.  5. Thrombocytopenia worsening unclear if heparin or not.  If need to d/c heparin for this reason Ok, but normally in setting of suspected NSTEMI usually will continue it until further definition of extent of CAD.  Recommendations:   Watch platelets. May need alteration in heparin if trend continues down.  Consider backing off diuresis.  Awaiting improvement in mental status and extubation prior to proceeding with cath.   Darden Palmer  MD [facility]  01/13/2015, 8:42 AM

## 2015-01-13 NOTE — Progress Notes (Signed)
ANTICOAGULATION CONSULT NOTE - Follow Up Consult  Pharmacy Consult for heparin Indication: ACS Heparin dosing weight = 96 kg  Labs:  Recent Labs  01/10/15 1100 01/10/15 1519  01/10/15 2240  01/11/15 0501  01/12/15 0411 01/12/15 0927 01/12/15 0936 01/12/15 1431 01/12/15 2140 01/13/15 0450  HGB  --   --   --   --   < > 13.0  --  12.4*  --   --   --   --  12.6*  HCT  --   --   --   --   --  41.2  --  40.3  --   --   --   --  41.3  PLT  --   --   --   --   --  125*  --  106*  --   --   --   --  93*  HEPARINUNFRC  --   --   < > 0.12*  --  0.28*  < >  --   --  0.34  --  0.34 0.36  CREATININE 1.88*  --   --   --   --  1.64*  --  1.55*  --   --  1.71*  --  1.83*  CKTOTAL  --   --   --   --   --   --   --   --  87  --   --   --  60  TROPONINI 4.22* 17.13*  --  16.25*  --   --   --   --   --   --   --   --   --   < > = values in this interval not displayed.   Assessment: 81yo male remains on IV heparin for ACS (Trop up to 16.25). Heparin level is therapeutic. Platelets are trending down at 93 but not 50% drop (plt history per EPIC 80s-100s). Hgb stable. No bleeding reported. Plan for possible cath.   Goal of Therapy:  Heparin level 0.3-0.7 units/ml  Monitor platelets.    Plan:  Continue heparin to 2150 units/hr. Daily heparin level and CBC.  Monitor platelets closely and for signs and symptoms of bleeding.    Link Snuffer, PharmD, BCPS Clinical Pharmacist [phone] 01/13/2015,10:45 AM

## 2015-01-13 NOTE — Progress Notes (Signed)
PULMONARY / CRITICAL CARE MEDICINE   Name: [name] MRN: [id number] DOB: Sep 15, 1932    ADMISSION DATE:  01/09/2015 CONSULTATION DATE:  01/10/15  REFERRING MD :  Dr. Gwenlyn Perking   CHIEF COMPLAINT:  AMS, Acute Respiratory Failure   INITIAL PRESENTATION: 81 y/o M with with recent (6/17) arteriography of RLE for venous ulcers who was admitted 6/18 with a 2 week hx of SOB.  Admitted per [facility] for further evaluation.  Treated with BiPAP but failed therapy and required intubation am of 6/19.  PCCM consulted for ICU transfer.    STUDIES:  6/18  CT ABD >> post-op changes in L groin, increased density in bladder, bibasilar atx 6/18  ECHO >> LVEF 55-60%, grade II diastolic dysfunction, moderate AS (wosening since last ECHO) 6/20 EEG>>>metabolic vs drug affect, no focus  6/19  CT of Head >> Stable moderate diffuse cerebral atrophy and mild chronic small vessel white matter ischemic changes 6/20 Echo: Left ventricle: The cavity size was normal. Wall thickness was normal. Systolic function was normal. The estimated ejection fraction was in the range of 60% to 65%. Wall motion was normal;there were no regional wall motion abnormalities. 6/20 EEG:  SIGNIFICANT EVENTS: 6/17  RLE arteriography per Dr. Edilia Bo for PVD and non-healing R toe wound 6/18  Admit to [facility] with complaints of SOB x 2 weeks 6/19  Decompensated early am, required intubation / ICU transfer 6/20-trop elevation, hep , asa 6/21: polymyoclonus with when precedex stopped 6/22: continued plt count drop to 93 down from 131  SUBJECTIVE: awake, and active  VITAL SIGNS: Temp:  [97.7 F (36.5 C)-99.4 F (37.4 C)] 98.4 F (36.9 C) (06/22 0416) Pulse Rate:  [58-110] 78 (06/22 0430) Resp:  [11-26] 15 (06/22 0430) BP: (104-136)/(37-53) 115/46 mmHg (06/22 0400) SpO2:  [90 %-96 %] 91 % (06/22 0430) Arterial Line BP: (100-138)/(43-69) 120/50 mmHg (06/22 0430) FiO2 (%):  [40 %-50 %] 40 % (06/22 0315) Weight:  [114.5 kg (252 lb 6.8 oz)] 114.5  kg (252 lb 6.8 oz) (06/22 0438)   HEMODYNAMICS:     VENTILATOR SETTINGS: Vent Mode:  [-] PRVC FiO2 (%):  [40 %-50 %] 40 % Set Rate:  [12 bmp] 12 bmp Vt Set:  [560 mL] 560 mL PEEP:  [8 cmH20] 8 cmH20 Plateau Pressure:  [23 cmH20-28 cmH20] 28 cmH20   INTAKE / OUTPUT:  Intake/Output Summary (Last 24 hours) at 01/13/15 0644 Last data filed at 01/13/15 0500  Gross per 24 hour  Intake 4242.57 ml  Output   2925 ml  Net 1317.57 ml    PHYSICAL EXAMINATION: General:  Obese male, agitated, lying in bed Neuro:  rass +3 will not follow commands, update, very awake now  rass 2 HEENT:  Right Pupil 79mm, Left 70mm brisk, reactive, OETT Cardiovascular:  s1s2 rrr, 3/6 harsh systolic murmur heard best at 2nd ISC RSB Lungs:  CTA but diminished bilaterally anteriorly, improved Abdomen:  Obese/soft, bsx4 active, mild distention, no BM noted Musculoskeletal:  No acute deformities  Skin:  Warm/dry,+1 BLE edema  LABS:  CBC  Recent Labs Lab 01/11/15 0501 01/12/15 0411 01/13/15 0450  WBC 11.7* 7.9 5.5  HGB 13.0 12.4* 12.6*  HCT 41.2 40.3 41.3  PLT 125* 106* 93*   Coag's No results for input(s): APTT, INR in the last 168 hours.   BMET  Recent Labs Lab 01/12/15 0411 01/12/15 1431 01/13/15 0450  NA 137 139 140  K 4.4 4.7 4.1  CL 100* 102 98*  CO2 30 31 33*  BUN  34* 40* 45*  CREATININE 1.55* 1.71* 1.83*  GLUCOSE 181* 122* 184*   Electrolytes  Recent Labs Lab 01/12/15 0411 01/12/15 0943 01/12/15 1431 01/13/15 0450  CALCIUM 8.1*  --  8.5* 8.5*  MG  --  2.2  --   --   PHOS  --   --  3.6  --    Sepsis Markers  Recent Labs Lab 01/09/15 0122 01/10/15 1100 01/11/15 0501 01/12/15 0411  LATICACIDVEN 2.46* 2.0  --   --   PROCALCITON  --  <0.10 0.27 0.25   ABG  Recent Labs Lab 01/10/15 0150 01/10/15 0602 01/10/15 1155  PHART 7.297* 7.286* 7.421  PCO2ART 66.5* 64.1* 44.4  PO2ART 62.5* 61.9* 75.0*   Liver Enzymes  Recent Labs Lab 01/09/15 0103 01/12/15 0411   AST 26 24  ALT 17 13*  ALKPHOS 100 62  BILITOT 0.7 0.9  ALBUMIN 3.5 2.6*   Cardiac Enzymes  Recent Labs Lab 01/10/15 1100 01/10/15 1519 01/10/15 2240  TROPONINI 4.22* 17.13* 16.25*   Glucose  Recent Labs Lab 01/12/15 0759 01/12/15 1332 01/12/15 1603 01/12/15 1956 01/12/15 2338 01/13/15 0411  GLUCAP 130* 131* 147* 153* 150* 137*    Imaging No results found.   ASSESSMENT / PLAN:  PULMONARY OETT 6/19 >> A: Acute Respiratory Failure - ? Infiltrates on CT ABD unimpressed Concern is pulm edema  From AS, ischemia Increased bilateral diffuse patchy opacifiction -improved with diuresis/has been afebrile Never Smoker  OSA  P:   -Daily wakup -VAP protocol -unable to use lasix today, see renal -To goal 40% then peep reduction as able - Daily CXR -wean consider SBT cpap 8 ps 15 required  CARDIOVASCULAR CVL L IJ 6/19 >>  A:  Hypotension - cardiogenic shock- sepsis unlikely given culture data Aortic Stenosis - worsening of valvular disease, d/w cards CAD s/p PCI (remote) PVD s/p Arteriography 6/17 per Dr. Edilia Bo  NSTEMI P:  -ICU monitoring -Neosynephrine off  -Trop to peak-6/19 17.13 -Heparin, cards wishes to continue in setting of NSTEMI- see heme -Continue ASA, Plavix  -cpk 60, consider statin start, eill d/w cards  RENAL A:   Acute on Chronic Kidney Injury-increasing SCr with diuresis P:   - hold diuresis for now in setting of increased SCr. -Chem in am to monitor lytes and SCr -kvo  GASTROINTESTINAL / GU  A:   GERD BPH - difficult to place catheter Constipation P:   -TF to goal -ppi - add miralax, now add dulc supp -colace bid kub assessment  HEMATOLOGIC A:  DVt prevention Thrombocytopenia (only heparin for 60 hrs), unlikely hit, noted low in past P:  DVT prophylaxis: hep drip Argatroban may need to switch if drops further Cbc in am to continue plat trend Low low risk HITT, low plat in past, pos balance = keep heparin,  follow  INFECTIOUS A:   Bibasilar Airspace Disease - r/o HCAP  Recent UTI Fever noted  Pct neg x 3 P:   BCx2 6/19 >> No blood cx collected  Sputum 6/19 >> Likely OPF UA 6/19 >>  UC 6/19 >>Pseudomonas Aeruginosa 20 K (doubt pathogen)  Vanco, start date 6/19>>>6/23  Zosyn, start date 6/19>>>  Secretions were thick prior Would narrow to ceftriaxone, total 7 days for this  ENDOCRINE A:   Mild Hyperglycemia  P:   Continue SSI Monitor glucose on BMP  NEUROLOGIC A:  Acute Encephalopathy- EEG: med effect vs encephalopathy Chronic facial twitch P:   RASS goal: 0 Fentanyl gtt for pain, WUA  PRN  versed for sedation  Increase precedex to max 2.4' Will re discuss with neuro therapy for exacerbation of this tick / myoclonus May need to switch to prop  FAMILY  - Updates: Family updated at beside.    - Inter-disciplinary family meet or Palliative Care meeting due by: 6/27   STAFF NOTE: I, Rory Percy, MD FACP have personally reviewed patient's available data, including medical history, events of note, physical examination and test results as part of my evaluation. I have discussed with resident/NP and other care providers such as pharmacist, RN and RRT. In addition, I personally evaluated patient and elicited key findings of: less secretions, coarse BS, oversedated this am , rass -3, need wua, re assess weaning and lower PS, need to escalate lasix to neg balance goals, crt has improved overall with lasix, upright as able, adding meds for BM, abx to not change for now, had fever although not impressed for PNA, neo in future if drops BP The patient is critically ill with multiple organ systems failure and requires high complexity decision making for assessment and support, frequent evaluation and titration of therapies, application of advanced monitoring technologies and extensive interpretation of multiple databases.    01/13/2015 6:44 AM  STAFF NOTE: Cindi Carbon, MD  FACP have personally reviewed patient's available data, including medical history, events of note, physical examination and test results as part of my evaluation. I have discussed with resident/NP and other care providers such as pharmacist, RN and RRT. In addition, I personally evaluated patient and elicited key findings of: agitation remains, precedex increase may need propofol, pcxr about unchanged, weaning poorly, lasix hold given crt rise, may need scan head - MRI, CT head was neg already, narrow off zosyn, vanc, add ceftriaxone, add stop dates, d/w cards, hep to remain, follow pla tclosely The patient is critically ill with multiple organ systems failure and requires high complexity decision making for assessment and support, frequent evaluation and titration of therapies, application of advanced monitoring technologies and extensive interpretation of multiple databases.   Critical Care Time devoted to patient care services described in this note is 30 Minutes. This time reflects time of care of this signee: Rory Percy, MD FACP. This critical care time does not reflect procedure time, or teaching time or supervisory time of PA/NP/Med student/Med Resident etc but could involve care discussion time. Rest per NP/medical resident whose note is outlined above and that I agree with   Mcarthur Rossetti. Tyson Alias, MD, FACP Pgr: [phone] [facility] 01/13/2015 10:28 AM

## 2015-01-14 ENCOUNTER — Inpatient Hospital Stay (HOSPITAL_COMMUNITY): Payer: Medicare Other

## 2015-01-14 DIAGNOSIS — G9340 Encephalopathy, unspecified: Secondary | ICD-10-CM | POA: Insufficient documentation

## 2015-01-14 LAB — MAGNESIUM
MAGNESIUM: 2.3 mg/dL (ref 1.7–2.4)
Magnesium: 2.4 mg/dL (ref 1.7–2.4)

## 2015-01-14 LAB — BASIC METABOLIC PANEL
ANION GAP: 9 (ref 5–15)
Anion gap: 6 (ref 5–15)
BUN: 52 mg/dL — ABNORMAL HIGH (ref 6–20)
BUN: 54 mg/dL — ABNORMAL HIGH (ref 6–20)
CALCIUM: 8.6 mg/dL — AB (ref 8.9–10.3)
CHLORIDE: 101 mmol/L (ref 101–111)
CO2: 34 mmol/L — ABNORMAL HIGH (ref 22–32)
CO2: 31 mmol/L (ref 22–32)
CREATININE: 1.59 mg/dL — AB (ref 0.61–1.24)
CREATININE: 1.46 mg/dL — AB (ref 0.61–1.24)
Calcium: 8.5 mg/dL — ABNORMAL LOW (ref 8.9–10.3)
Chloride: 102 mmol/L (ref 101–111)
GFR calc Af Amer: 51 mL/min — ABNORMAL LOW (ref >60)
GFR calc non Af Amer: 44 mL/min — ABNORMAL LOW (ref >60)
GFR, EST AFRICAN AMERICAN: 46 mL/min — AB (ref >60)
GFR, EST NON AFRICAN AMERICAN: 40 mL/min — AB (ref >60)
Glucose, Bld: 135 mg/dL — ABNORMAL HIGH (ref 65–99)
Glucose, Bld: 132 mg/dL — ABNORMAL HIGH (ref 65–99)
POTASSIUM: 3.7 mmol/L (ref 3.5–5.1)
Potassium: 4.1 mmol/L (ref 3.5–5.1)
SODIUM: 142 mmol/L (ref 135–145)
Sodium: 141 mmol/L (ref 135–145)

## 2015-01-14 LAB — CBC
HEMATOCRIT: 36.8 % — AB (ref 39.0–52.0)
Hemoglobin: 11.5 g/dL — ABNORMAL LOW (ref 13.0–17.0)
MCH: 29.4 pg (ref 26.0–34.0)
MCHC: 31.3 g/dL (ref 30.0–36.0)
MCV: 94.1 fL (ref 78.0–100.0)
PLATELETS: 107 10*3/uL — AB (ref 150–400)
RBC: 3.91 MIL/uL — ABNORMAL LOW (ref 4.22–5.81)
RDW: 15.8 % — AB (ref 11.5–15.5)
WBC: 4.8 10*3/uL (ref 4.0–10.5)

## 2015-01-14 LAB — GLUCOSE, CAPILLARY
GLUCOSE-CAPILLARY: 137 mg/dL — AB (ref 65–99)
GLUCOSE-CAPILLARY: 128 mg/dL — AB (ref 65–99)
GLUCOSE-CAPILLARY: 120 mg/dL — AB (ref 65–99)
GLUCOSE-CAPILLARY: 142 mg/dL — AB (ref 65–99)
Glucose-Capillary: 122 mg/dL — ABNORMAL HIGH (ref 65–99)
Glucose-Capillary: 108 mg/dL — ABNORMAL HIGH (ref 65–99)

## 2015-01-14 LAB — HEPARIN LEVEL (UNFRACTIONATED)
HEPARIN UNFRACTIONATED: 0.42 [IU]/mL (ref 0.30–0.70)
Heparin Unfractionated: 0.27 IU/mL — ABNORMAL LOW (ref 0.30–0.70)

## 2015-01-14 LAB — PHOSPHORUS
Phosphorus: 4.2 mg/dL (ref 2.5–4.6)
Phosphorus: 4 mg/dL (ref 2.5–4.6)

## 2015-01-14 MED ORDER — FUROSEMIDE 10 MG/ML IJ SOLN
80.0000 mg | Freq: Four times a day (QID) | INTRAMUSCULAR | Status: AC
  Administered 2015-01-14 – 2015-01-15 (×4): 80 mg via INTRAVENOUS

## 2015-01-14 MED ORDER — PRO-STAT SUGAR FREE PO LIQD
60.0000 mL | Freq: Four times a day (QID) | ORAL | Status: DC
  Administered 2015-01-14 – 2015-01-21 (×27): 60 mL
  Filled 2015-01-14 (×32): qty 60

## 2015-01-14 MED ORDER — POTASSIUM CHLORIDE 20 MEQ/15ML (10%) PO SOLN
40.0000 meq | Freq: Once | ORAL | Status: AC
  Administered 2015-01-14: 40 meq
  Filled 2015-01-14: qty 30

## 2015-01-14 MED ORDER — LACTULOSE 10 GM/15ML PO SOLN
30.0000 g | Freq: Three times a day (TID) | ORAL | Status: DC
  Administered 2015-01-14 – 2015-01-16 (×9): 30 g via ORAL
  Filled 2015-01-14 (×12): qty 45

## 2015-01-14 MED ORDER — VITAL HIGH PROTEIN PO LIQD
1000.0000 mL | ORAL | Status: DC
  Administered 2015-01-14: 1000 mL
  Administered 2015-01-15 (×2)
  Administered 2015-01-15: 1000 mL
  Administered 2015-01-15 (×2)
  Administered 2015-01-16: 1000 mL
  Administered 2015-01-19: 15:00:00
  Administered 2015-01-19 – 2015-01-20 (×2): 1000 mL
  Filled 2015-01-14 (×9): qty 1000

## 2015-01-14 NOTE — Progress Notes (Signed)
Pt transported to and from MRI with no complications. 

## 2015-01-14 NOTE — Progress Notes (Signed)
RT called to room due to pt SpO2 dropping to 87%. Pt bagged and lavaged on 100%. Moderate, thick, tan secretions suctioned post lavage. SpO2 92% on 50% and peep of 8. No setting changes indicated at this time. RT will continue to monitor.

## 2015-01-14 NOTE — Progress Notes (Signed)
ANTICOAGULATION CONSULT NOTE - Follow Up Consult  Pharmacy Consult for heparin Indication: ACS Heparin dosing weight = 96 kg  Labs:  Recent Labs  01/12/15 0411 01/12/15 0927  01/12/15 1431  01/13/15 0450 01/14/15 0446 01/14/15 1330  HGB 12.4*  --   --   --   --  12.6* 11.5*  --   HCT 40.3  --   --   --   --  41.3 36.8*  --   PLT 106*  --   --   --   --  93* 107*  --   HEPARINUNFRC  --   --   < >  --   < > 0.36 0.27* 0.42  CREATININE 1.55*  --   --  1.71*  --  1.83* 1.59*  --   CKTOTAL  --  87  --   --   --  60  --   --   < > = values in this interval not displayed.   Assessment: 81yo male remains on IV heparin for ACS (Trop up to 16.25). Heparin level is therapeutic after increase this AM. Platelets improved today up to 107 (plt history per EPIC 80s-100s). Hgb stable. No bleeding reported. Plan for possible cath.   Goal of Therapy:  Heparin level 0.3-0.7 units/ml  Monitor platelets.    Plan:  Continue heparin at 2300 units/hr.  Daily heparin level and CBC.  Monitor platelets closely and for signs and symptoms of bleeding.    Link Snuffer, PharmD, BCPS Clinical Pharmacist [phone] 01/14/2015,2:12 PM

## 2015-01-14 NOTE — Progress Notes (Signed)
eLink Physician-Brief Progress Note Patient Name: [name] DOB: August 08, 1932 MRN: [id number]   Date of Service  01/14/2015  HPI/Events of Note  Receiving lasix K mildly low  eICU Interventions  supp K     Intervention Category Intermediate Interventions: Electrolyte abnormality - evaluation and management  MCQUAID, DOUGLAS 01/14/2015, 10:57 PM

## 2015-01-14 NOTE — Progress Notes (Signed)
ANTICOAGULATION CONSULT NOTE - Follow Up Consult  Pharmacy Consult for heparin Indication: NSTEMI   Labs:  Recent Labs  01/12/15 0411 01/12/15 0927  01/12/15 1431 01/12/15 2140 01/13/15 0450 01/14/15 0446  HGB 12.4*  --   --   --   --  12.6* 11.5*  HCT 40.3  --   --   --   --  41.3 36.8*  PLT 106*  --   --   --   --  93* 107*  HEPARINUNFRC  --   --   < >  --  0.34 0.36 0.27*  CREATININE 1.55*  --   --  1.71*  --  1.83* 1.59*  CKTOTAL  --  87  --   --   --  60  --   < > = values in this interval not displayed.   Assessment: 81yo male now slightly subtherapeutic on heparin after two levels at low end of goal.  Goal of Therapy:  Heparin level 0.3-0.7 units/ml   Plan:  Will increase heparin gtt by 1 unit/kg/hr to 2300 units/hr and check level in 8hr.  Vernard Gambles, PharmD, BCPS  01/14/2015,5:33 AM

## 2015-01-14 NOTE — Progress Notes (Signed)
Nutrition Follow-up  DOCUMENTATION CODES:  Obesity unspecified  INTERVENTION:   Continue Vital High Protein via OGT, decrease goal rate to 15 ml/h with 60 ml Prostat QID to provide 1160 kcals, 152 gm protein, 301 ml free water daily.  Total intake from TF + Propfol will be 1794 kcals per day.  NUTRITION DIAGNOSIS:  Inadequate oral intake related to inability to eat as evidenced by NPO status.  GOAL:  Provide needs based on ASPEN/SCCM guidelines  MONITOR:  Vent status, TF tolerance, Skin, Weight trends, Labs  REASON FOR ASSESSMENT:  Consult Enteral/tube feeding initiation and management  ASSESSMENT:  Patient admitted on 6/18 with a 2 week hx of SOB. Failed BiPAP therapy and required intubation on 6/19.   Discussed patient in ICU rounds and with RN today. Labs reviewed. Propofol started 6/22.  Patient is currently intubated on ventilator support MV: 8 L/min Temp (24hrs), Avg:98.9 F (37.2 C), Min:98.3 F (36.8 C), Max:99.6 F (37.6 C)  Propofol: 24 ml/hr providing 634 kcals per day.  Patient is currently receiving Vital High Protein via OGT at goal rate of 70 ml/h (1680 ml per day) to provide 1680 kcals, 147 gm protein, 1404 ml free water daily. Total intake with propofol is 2314 kcals.  Height:  Ht Readings from Last 1 Encounters:  01/10/15 5\' 9"  (1.753 m)    Weight:  Wt Readings from Last 1 Encounters:  01/14/15 253 lb 1.4 oz (114.8 kg)   01/11/15 255 lb 15.3 oz (116.1 kg)       Ideal Body Weight:  72.7 kg  BMI:  Body mass index is 37.36 kg/(m^2).  Estimated Nutritional Needs:  Kcal:  6015-6153  Protein:  145 gm  Fluid:  2 L  Skin:   right leg wound; stage II wound to right foot  Diet Order:  Diet NPO time specified  EDUCATION NEEDS:  No education needs identified at this time   Intake/Output Summary (Last 24 hours) at 01/14/15 1509 Last data filed at 01/14/15 1200  Gross per 24 hour  Intake 2834.53 ml  Output   2115 ml  Net  719.53 ml    Last BM:  6/19   Joaquin Courts, RD, LDN, CNSC Pager [phone] After Hours Pager [phone]

## 2015-01-14 NOTE — Progress Notes (Signed)
Subjective:  Not able to get history as remains intubated.  Opens eyes to voice, but does not follow commands.  MRI yesterday no change from before.  No acute process. Objective:  Vital Signs in the last 24 hours: BP 147/70 mmHg  Pulse 86  Temp(Src) 98.9 F (37.2 C) (Oral)  Resp 18  Ht 5\' 9"  (1.753 m)  Wt 114.8 kg (253 lb 1.4 oz)  BMI 37.36 kg/m2  SpO2 98%  Physical Exam: Obese somewhat plethoric male intubated, opens eys to voice butr does not follow commandsLungs:  Reduced breath sounds  Cardiac:  Regular rhythm, normal S1 and S2, no S3, harsh 3/6 holosystolic murmur Abdomen:  Soft, nontender, no masses, obese Extremities: trace+ edema noted, dry ulcer on tip of toe on right.  Intake/Output from previous day: 06/22 0701 - 06/23 0700 In: 3978.8 [I.V.:1778.8; NG/GT:1700; IV Piggyback:100] Out: 2625 [Urine:2625]  Weight Filed Weights   01/12/15 0345 01/13/15 0438 01/14/15 0500  Weight: 113.6 kg (250 lb 7.1 oz) 114.5 kg (252 lb 6.8 oz) 114.8 kg (253 lb 1.4 oz)    Lab Results: Basic Metabolic Panel:  Recent Labs  32/44/01 0450 01/14/15 0446  NA 140 142  K 4.1 4.1  CL 98* 102  CO2 33* 34*  GLUCOSE 184* 135*  BUN 45* 52*  CREATININE 1.83* 1.59*   CBC:  Recent Labs  01/13/15 0450 01/14/15 0446  WBC 5.5 4.8  HGB 12.6* 11.5*  HCT 41.3 36.8*  MCV 94.9 94.1  PLT 93* 107*   Cardiac Enzymes:  Cardiac Panel (last 3 results)  Recent Labs  01/12/15 0927 01/13/15 0450  CKTOTAL 87 60    Telemetry: Sinus rhythm  Assessment/Plan:  1.  Acute on chronic diastolic heart failure. Probably volume neutral now.  2.  Aortic stenosis at least moderate and may be more severe-the aortic stenosis very well could be contributing to his repeated admissions for respiratory failure.  He has had several admissions now over the past 2 years 3.  Non-STEMI with elevation of troponin.  cath planned when stable.  Continue heparin if tolerated until cath.  4.  Acute renal failure  with recent contrast exposure as well as diuresis.  Appears improved today with back off of diuresis.  5. Thrombocytopenia some improved today. 6. History of CAD  Recommendations:  Awaiting general improvement prior to repeat cath.  OK to keep on heparin for now.   Darden Palmer  MD [facility] Card[facility]  01/14/2015, 8:22 AM

## 2015-01-14 NOTE — Progress Notes (Signed)
PULMONARY / CRITICAL CARE MEDICINE   Name: [name] MRN: [id number] DOB: 12-13-1932    ADMISSION DATE:  01/09/2015 CONSULTATION DATE:  01/10/15  REFERRING MD :  Dr. Gwenlyn Perking   CHIEF COMPLAINT:  AMS, Acute Respiratory Failure   INITIAL PRESENTATION: 81 y/o M with with recent (6/17) arteriography of RLE for venous ulcers who was admitted 6/18 with a 2 week hx of SOB.  Admitted per [facility] for further evaluation.  Treated with BiPAP but failed therapy and required intubation am of 6/19.  PCCM consulted for ICU transfer.    STUDIES:  6/18  CT ABD >> post-op changes in L groin, increased density in bladder, bibasilar atx 6/18  ECHO >> LVEF 55-60%, grade II diastolic dysfunction, moderate AS (wosening since last ECHO) 6/20 EEG>>>metabolic vs drug affect, no focus  6/19  CT of Head >> Stable moderate diffuse cerebral atrophy and mild chronic small vessel white matter ischemic changes 6/20 Echo: Left ventricle: The cavity size was normal. Wall thickness was normal. Systolic function was normal. The estimated ejection fraction was in the range of 60% to 65%. Wall motion was normal;there were no regional wall motion abnormalities. 6/22: MR Brain: No infarct, age related atrophy, mild chronic small vessel disease  SIGNIFICANT EVENTS: 6/17  RLE arteriography per Dr. Edilia Bo for PVD and non-healing R toe wound 6/18  Admit to [facility] with complaints of SOB x 2 weeks 6/19  Decompensated early am, required intubation / ICU transfer 6/20-trop elevation, hep , asa 6/21: polymyoclonus with when precedex stopped 6/22: continued plt count drop to 93 down from 131,agitation required change in sedation to prop 6/23: worsening chest film, plt stable  SUBJECTIVE: sedated on mechanical ventilation, worsening gpcxr  VITAL SIGNS: Temp:  [98.3 F (36.8 C)-99.6 F (37.6 C)] 98.3 F (36.8 C) (06/23 0403) Pulse Rate:  [56-126] 60 (06/23 0600) Resp:  [12-23] 13 (06/23 0600) BP: (95-137)/(33-77) 95/41 mmHg (06/23  0600) SpO2:  [89 %-100 %] 92 % (06/23 0600) Arterial Line BP: (98-163)/(38-80) 100/40 mmHg (06/23 0600) FiO2 (%):  [40 %-50 %] 50 % (06/23 0600) Weight:  [114.8 kg (253 lb 1.4 oz)] 114.8 kg (253 lb 1.4 oz) (06/23 0500)   HEMODYNAMICS: CVP:  [13 mmHg-15 mmHg] 13 mmHg   VENTILATOR SETTINGS: Vent Mode:  [-] PRVC FiO2 (%):  [40 %-50 %] 50 % Set Rate:  [12 bmp] 12 bmp Vt Set:  [560 mL] 560 mL PEEP:  [8 cmH20] 8 cmH20 Plateau Pressure:  [24 cmH20-29 cmH20] 25 cmH20   INTAKE / OUTPUT:  Intake/Output Summary (Last 24 hours) at 01/14/15 4098 Last data filed at 01/14/15 0602  Gross per 24 hour  Intake 3978.8 ml  Output   2625 ml  Net 1353.8 ml    PHYSICAL EXAMINATION: General:  Obese male, sedated, lying in bed Neuro:  rass -2will not follow commands, opens eyes to voice HEENT:  Right Pupil 2mm, Left 4mm brisk, reactive, OETT Cardiovascular:  s1s2 rrr, 3/6 harsh systolic murmur heard best at 2nd ISC RSB Lungs:  Crackles Abdomen:  Obese/soft, bsx4 active, mild distention, no BM noted Musculoskeletal:  No acute deformities  Skin:  Warm/dry,+2 BLE edema  LABS:  CBC  Recent Labs Lab 01/12/15 0411 01/13/15 0450 01/14/15 0446  WBC 7.9 5.5 4.8  HGB 12.4* 12.6* 11.5*  HCT 40.3 41.3 36.8*  PLT 106* 93* 107*   Coag's No results for input(s): APTT, INR in the last 168 hours.   BMET  Recent Labs Lab 01/12/15 1431 01/13/15 0450 01/14/15 1191  NA 139 140 142  K 4.7 4.1 4.1  CL 102 98* 102  CO2 31 33* 34*  BUN 40* 45* 52*  CREATININE 1.71* 1.83* 1.59*  GLUCOSE 122* 184* 135*   Electrolytes  Recent Labs Lab 01/12/15 0943 01/12/15 1431 01/13/15 0450 01/14/15 0446  CALCIUM  --  8.5* 8.5* 8.6*  MG 2.2  --   --  2.3  PHOS  --  3.6  --  4.2   Sepsis Markers  Recent Labs Lab 01/09/15 0122 01/10/15 1100 01/11/15 0501 01/12/15 0411  LATICACIDVEN 2.46* 2.0  --   --   PROCALCITON  --  <0.10 0.27 0.25   ABG  Recent Labs Lab 01/10/15 0150 01/10/15 0602  01/10/15 1155  PHART 7.297* 7.286* 7.421  PCO2ART 66.5* 64.1* 44.4  PO2ART 62.5* 61.9* 75.0*   Liver Enzymes  Recent Labs Lab 01/09/15 0103 01/12/15 0411  AST 26 24  ALT 17 13*  ALKPHOS 100 62  BILITOT 0.7 0.9  ALBUMIN 3.5 2.6*   Cardiac Enzymes  Recent Labs Lab 01/10/15 1100 01/10/15 1519 01/10/15 2240  TROPONINI 4.22* 17.13* 16.25*   Glucose  Recent Labs Lab 01/13/15 0743 01/13/15 1131 01/13/15 1603 01/13/15 1957 01/13/15 2321 01/14/15 0402  GLUCAP 152* 146* 145* 112* 137* 122*    Imaging Mr Brain Wo Contrast  01/14/2015   CLINICAL DATA:  Initial evaluation for acute encephalopathy.  EXAM: MRI HEAD WITHOUT CONTRAST  TECHNIQUE: Multiplanar, multiecho pulse sequences of the brain and surrounding structures were obtained without intravenous contrast.  COMPARISON:  Prior CT from 01/10/2015  FINDINGS: Diffuse prominence of the CSF containing spaces is compatible with generalized cerebral atrophy. There are scattered patchy T2/FLAIR hyperintense foci within the periventricular white matter, nonspecific, but likely related to chronic small vessel ischemic disease. These are similar relative to prior MRI.  No abnormal foci of restricted diffusion to suggest acute intracranial infarct. Gray-white matter differentiation maintained. Normal intravascular flow voids are preserved. No acute or chronic intracranial hemorrhage.  No mass lesion, mass effect, or midline shift. No hydrocephalus. No extra-axial fluid collection. Hippocampi symmetric in size with normal morphology and signal intensity.  Craniocervical junction within normal limits. Pituitary gland normal.  No acute abnormality about the orbits. Sequela prior bilateral lens extraction noted.  Fluid density present within the nasopharynx, likely related intubation. Scattered mucosal thickening present within the sphenoid sinuses and ethmoidal air cells. Bilateral mastoid effusions present. Inner ear structures normal.  Scattered  degenerative changes present within the visualized upper cervical spine. Bone marrow signal intensity within normal limits. Scalp soft tissues unremarkable.  IMPRESSION: 1. No acute intracranial infarct or other process identified. 2. Generalized age-related cerebral atrophy with mild chronic small vessel ischemic disease.   Electronically Signed   By: Rise Mu M.D.   On: 01/14/2015 04:30   Dg Abd Portable 1v  01/13/2015   CLINICAL DATA:  Abdominal distention  EXAM: PORTABLE ABDOMEN - 1 VIEW  COMPARISON:  01/10/2015  FINDINGS: Nasogastric catheter is again noted within the stomach. Scattered large and small bowel gas is seen. No obstructive changes are noted. Degenerative change of the lumbar spine is seen as well as changes of prior laminectomy.  IMPRESSION: No acute abnormality noted.   Electronically Signed   By: Alcide Clever M.D.   On: 01/13/2015 11:36     ASSESSMENT / PLAN:  PULMONARY OETT 6/19 >>> A: Acute Respiratory Failure  Concern is pulm edema  From AS, ischemia Increased bilateral diffuse patchy opacifiction -worsening 6/23 since diuresis held  Never Smoker  OSA  P:   -Daily wakup -VAP protocol -push diruesis 6/23 -To goal 40% then peep reduction as able - pcxr for volume in am  -wean as tolerated -wean attempts  CARDIOVASCULAR CVL L IJ 6/19 >>  A:  Hypotension - cardiogenic shock- sepsis unlikely given culture data Aortic Stenosis - worsening of valvular disease, d/w cards CAD s/p PCI (remote) PVD s/p Arteriography 6/17 per Dr. Edilia Bo  NSTEMI P:  -ICU monitoring -Neosynephrine off since 6/21 -Heparin gtt continue until PCI ready -Continue ASA, Plavix  -initiate statin if ok by cards -lasix  RENAL A:   Acute on Chronic Kidney Injury-increasing SCr with diuresis  P:   -push diuresis as SBP allows -Chem in am to monitor lytes and SCr -kvo  GASTROINTESTINAL / GU  A:   GERD BPH - difficult to place catheter Constipation-kub 6/22 no  obstruction P:   -TF to goal -ppi - add miralax, now add SSE -colace bid kub assessment -may need Ct abdo, although no obstruction on kub -will rectal exam, if neg, consider mag citrate -add lactulose  HEMATOLOGIC A:  DVt prevention Thrombocytopenia, noted low in past- plt stable 6/23 P:  DVT prophylaxis: hep drip Cbc in am to continue plat trend Low low risk HITT, low plat in past and plat rise, keep hep  INFECTIOUS A:   Bibasilar Airspace Disease - r/o HCAP  Recent UTI Fever noted  Pct neg x 3 P:   BCx2 6/19 >> No blood cx collected  Sputum 6/19 >> Likely OPF UA 6/19 >>  UC 6/19 >>Pseudomonas Aeruginosa 20 K (doubt pathogen)  Ceftriaxone>>>6/22 for 4 days Vanco, start date 6/19>>>6/22  Zosyn, start date 6/19>>>6/22  ENDOCRINE A:   Mild Hyperglycemia  P:   Continue SSI Q4 blood glucose checks  NEUROLOGIC A:  Acute Encephalopathy- MR brain 6/22 negative for infarct Chronic facial twitch P:   RASS goal: -1 Now on propofol gtt for sedation Fentanyl gtt for pain, WUA  PRN versed for sedation Avoid anti dop   FAMILY  - Updates: Family updated at beside.    - Inter-disciplinary family meet or Palliative Care meeting due by: 6/27   Ccm time 30 min   Mcarthur Rossetti. Tyson Alias, MD, FACP Pgr: [phone] [facility]

## 2015-01-15 ENCOUNTER — Inpatient Hospital Stay (HOSPITAL_COMMUNITY): Admission: RE | Admit: 2015-01-15 | Payer: Medicare Other | Source: Ambulatory Visit

## 2015-01-15 ENCOUNTER — Inpatient Hospital Stay (HOSPITAL_COMMUNITY): Payer: Medicare Other

## 2015-01-15 DIAGNOSIS — I2510 Atherosclerotic heart disease of native coronary artery without angina pectoris: Secondary | ICD-10-CM

## 2015-01-15 LAB — POCT I-STAT 3, ART BLOOD GAS (G3+)
Acid-Base Excess: 11 mmol/L — ABNORMAL HIGH (ref 0.0–2.0)
Bicarbonate: 38.5 mEq/L — ABNORMAL HIGH (ref 20.0–24.0)
O2 Saturation: 93 %
PCO2 ART: 64 mmHg — AB (ref 35.0–45.0)
PH ART: 7.387 (ref 7.350–7.450)
Patient temperature: 37
TCO2: 40 mmol/L (ref 0–100)
pO2, Arterial: 71 mmHg — ABNORMAL LOW (ref 80.0–100.0)

## 2015-01-15 LAB — GLUCOSE, CAPILLARY
GLUCOSE-CAPILLARY: 137 mg/dL — AB (ref 65–99)
GLUCOSE-CAPILLARY: 140 mg/dL — AB (ref 65–99)
GLUCOSE-CAPILLARY: 118 mg/dL — AB (ref 65–99)
Glucose-Capillary: 128 mg/dL — ABNORMAL HIGH (ref 65–99)
Glucose-Capillary: 137 mg/dL — ABNORMAL HIGH (ref 65–99)
Glucose-Capillary: 151 mg/dL — ABNORMAL HIGH (ref 65–99)

## 2015-01-15 LAB — CBC
HEMATOCRIT: 39.6 % (ref 39.0–52.0)
HEMOGLOBIN: 12.1 g/dL — AB (ref 13.0–17.0)
MCH: 29 pg (ref 26.0–34.0)
MCHC: 30.6 g/dL (ref 30.0–36.0)
MCV: 95 fL (ref 78.0–100.0)
Platelets: 122 10*3/uL — ABNORMAL LOW (ref 150–400)
RBC: 4.17 MIL/uL — ABNORMAL LOW (ref 4.22–5.81)
RDW: 15.9 % — ABNORMAL HIGH (ref 11.5–15.5)
WBC: 6.2 10*3/uL (ref 4.0–10.5)

## 2015-01-15 LAB — HEPARIN LEVEL (UNFRACTIONATED)
HEPARIN UNFRACTIONATED: 0.25 [IU]/mL — AB (ref 0.30–0.70)
Heparin Unfractionated: 0.45 IU/mL (ref 0.30–0.70)
Heparin Unfractionated: 0.46 IU/mL (ref 0.30–0.70)

## 2015-01-15 LAB — BASIC METABOLIC PANEL
ANION GAP: 10 (ref 5–15)
Anion gap: 11 (ref 5–15)
BUN: 54 mg/dL — ABNORMAL HIGH (ref 6–20)
BUN: 58 mg/dL — AB (ref 6–20)
CALCIUM: 8.5 mg/dL — AB (ref 8.9–10.3)
CO2: 34 mmol/L — ABNORMAL HIGH (ref 22–32)
CO2: 36 mmol/L — ABNORMAL HIGH (ref 22–32)
CREATININE: 1.53 mg/dL — AB (ref 0.61–1.24)
CREATININE: 1.43 mg/dL — AB (ref 0.61–1.24)
Calcium: 9.3 mg/dL (ref 8.9–10.3)
Chloride: 99 mmol/L — ABNORMAL LOW (ref 101–111)
Chloride: 99 mmol/L — ABNORMAL LOW (ref 101–111)
GFR calc Af Amer: 48 mL/min — ABNORMAL LOW (ref >60)
GFR, EST AFRICAN AMERICAN: 52 mL/min — AB (ref >60)
GFR, EST NON AFRICAN AMERICAN: 42 mL/min — AB (ref >60)
GFR, EST NON AFRICAN AMERICAN: 45 mL/min — AB (ref >60)
Glucose, Bld: 140 mg/dL — ABNORMAL HIGH (ref 65–99)
Glucose, Bld: 128 mg/dL — ABNORMAL HIGH (ref 65–99)
Potassium: 4.5 mmol/L (ref 3.5–5.1)
Potassium: 3.9 mmol/L (ref 3.5–5.1)
Sodium: 143 mmol/L (ref 135–145)
Sodium: 146 mmol/L — ABNORMAL HIGH (ref 135–145)

## 2015-01-15 LAB — MAGNESIUM: MAGNESIUM: 2.4 mg/dL (ref 1.7–2.4)

## 2015-01-15 LAB — PHOSPHORUS: Phosphorus: 4.6 mg/dL (ref 2.5–4.6)

## 2015-01-15 MED ORDER — BISACODYL 10 MG RE SUPP: 10.0000 mg | Freq: Every day | RECTAL | Status: DC | PRN

## 2015-01-15 MED ORDER — FUROSEMIDE 10 MG/ML IJ SOLN: 80.0000 mg | Freq: Four times a day (QID) | INTRAMUSCULAR | Status: DC

## 2015-01-15 MED ORDER — FUROSEMIDE 10 MG/ML IJ SOLN
120.0000 mg | Freq: Four times a day (QID) | INTRAVENOUS | Status: DC
  Administered 2015-01-15 – 2015-01-19 (×16): 120 mg via INTRAVENOUS
  Filled 2015-01-15 (×18): qty 12

## 2015-01-15 NOTE — Progress Notes (Addendum)
ANTICOAGULATION CONSULT NOTE - Follow Up Consult  Pharmacy Consult for Heparin  Indication: chest pain/ACS  Allergies  Allergen Reactions  . Statins Other (See Comments)    Severe leg myalgias, weakness   Patient Measurements: Height: 5\' 9"  (175.3 cm) Weight: 256 lb 2.8 oz (116.2 kg) IBW/kg (Calculated) : 70.7  Vital Signs: Temp: 99.8 F (37.7 C) (06/24 1151) Temp Source: Oral (06/24 1151) BP: 129/65 mmHg (06/24 1200) Pulse Rate: 104 (06/24 1200)  Labs:  Recent Labs  01/13/15 0450 01/14/15 0446 01/14/15 1330 01/14/15 1600 01/15/15 0404 01/15/15 0405 01/15/15 1255  HGB 12.6* 11.5*  --   --  12.1*  --   --   HCT 41.3 36.8*  --   --  39.6  --   --   PLT 93* 107*  --   --  122*  --   --   HEPARINUNFRC 0.36 0.27* 0.42  --   --  0.25* 0.45  CREATININE 1.83* 1.59*  --  1.46* 1.53*  --   --   CKTOTAL 60  --   --   --   --   --   --     Estimated Creatinine Clearance: 49.2 mL/min (by C-G formula based on Cr of 1.53).  Assessment: 81yo male remains on IV heparin for ACS (Trop up to 16.25)- per cards likely demand ischemia but plan for cath once able.   Heparin level is therapeutic after increase this AM. Platelets improved today up to 122 (plt history per EPIC 80s-100s). Hgb stable. No bleeding reported.   Goal of Therapy:  Heparin level 0.3-0.7 units/ml Monitor platelets by anticoagulation protocol: Yes   Plan:  -Continue heparin to 2450 units/hr -Heparin level in 8 hours to confirm  -Daily CBC/HL -Monitor for bleeding -F/U cath plans  Link Snuffer, PharmD, BCPS Clinical Pharmacist [phone] 01/15/2015,1:44 PM  Addendum:  Repeat level is also therapeutic.   Cont same rate at 2450 units/hr  F/u level in AM  Ulyses Southward, PharmD Pager: [phone] 01/15/2015 9:32 PM

## 2015-01-15 NOTE — Progress Notes (Signed)
PULMONARY / CRITICAL CARE MEDICINE   Name: [name] MRN: [id number] DOB: April 20, 1933    ADMISSION DATE:  01/09/2015 CONSULTATION DATE:  01/10/15  REFERRING MD :  Dr. Gwenlyn Perking   CHIEF COMPLAINT:  AMS, Acute Respiratory Failure   INITIAL PRESENTATION: 81 y/o M with with recent (6/17) arteriography of RLE for venous ulcers who was admitted 6/18 with a 2 week hx of SOB.  Admitted per [facility] for further evaluation.  Treated with BiPAP but failed therapy and required intubation am of 6/19.  PCCM consulted for ICU transfer.    STUDIES:  6/18  CT ABD >> post-op changes in L groin, increased density in bladder, bibasilar atx 6/18  ECHO >> LVEF 55-60%, grade II diastolic dysfunction, moderate AS (wosening since last ECHO) 6/20 EEG>>>metabolic vs drug affect, no focus  6/19  CT of Head >> Stable moderate diffuse cerebral atrophy and mild chronic small vessel white matter ischemic changes 6/20 Echo: Left ventricle: The cavity size was normal. Wall thickness was normal. Systolic function was normal. The estimated ejection fraction was in the range of 60% to 65%. Wall motion was normal;there were no regional wall motion abnormalities. 6/22: MR Brain: No infarct, age related atrophy, mild chronic small vessel disease  SIGNIFICANT EVENTS: 6/17  RLE arteriography per Dr. Edilia Bo for PVD and non-healing R toe wound 6/18  Admit to [facility] with complaints of SOB x 2 weeks 6/19  Decompensated early am, required intubation / ICU transfer 6/20-trop elevation, hep , asa 6/21: polymyoclonus with when precedex stopped 6/22: continued plt count drop to 93 down from 131,agitation required change in sedation to prop 6/23: worsening chest film, plt stable  SUBJECTIVE: neg balance finally 1.1 liters, 70% increase fio2  VITAL SIGNS: Temp:  [98.6 F (37 C)-99.2 F (37.3 C)] 99.2 F (37.3 C) (06/24 0737) Pulse Rate:  [58-110] 105 (06/24 1000) Resp:  [12-18] 17 (06/24 0900) BP: (102-143)/(35-95) 128/48 mmHg (06/24  1000) SpO2:  [89 %-97 %] 92 % (06/24 1000) Arterial Line BP: (97-164)/(39-62) 118/47 mmHg (06/24 1000) FiO2 (%):  [50 %-70 %] 70 % (06/24 1000) Weight:  [116.2 kg (256 lb 2.8 oz)] 116.2 kg (256 lb 2.8 oz) (06/24 0500)   HEMODYNAMICS: CVP:  [8 mmHg-16 mmHg] 9 mmHg   VENTILATOR SETTINGS: Vent Mode:  [-] PRVC FiO2 (%):  [50 %-70 %] 70 % Set Rate:  [12 bmp] 12 bmp Vt Set:  [560 mL] 560 mL PEEP:  [8 cmH20] 8 cmH20 Plateau Pressure:  [26 cmH20-30 cmH20] 28 cmH20   INTAKE / OUTPUT:  Intake/Output Summary (Last 24 hours) at 01/15/15 1040 Last data filed at 01/15/15 1034  Gross per 24 hour  Intake 2577.61 ml  Output   4085 ml  Net -1507.39 ml    PHYSICAL EXAMINATION: General:  Obese male, sedated, lying in bed Neuro:  rass -1, calmer today HEENT:  Right Pupil 2mm, jvd remains Cardiovascular:  s1s2 rrr, 3/6 harsh systolic murmur heard best at 2nd ISC RSB Lungs:  Crackles about same, anterior coarse Abdomen:  Obese/soft, bsx4 active, mild distention, small BM noted Musculoskeletal:  No acute deformities  Skin:  Warm/dry,+2 BLE edema  LABS:  CBC  Recent Labs Lab 01/13/15 0450 01/14/15 0446 01/15/15 0404  WBC 5.5 4.8 6.2  HGB 12.6* 11.5* 12.1*  HCT 41.3 36.8* 39.6  PLT 93* 107* 122*   Coag's No results for input(s): APTT, INR in the last 168 hours.   BMET  Recent Labs Lab 01/14/15 0446 01/14/15 1600 01/15/15 0404  NA  142 141 143  K 4.1 3.7 4.5  CL 102 101 99*  CO2 34* 31 34*  BUN 52* 54* 54*  CREATININE 1.59* 1.46* 1.53*  GLUCOSE 135* 132* 140*   Electrolytes  Recent Labs Lab 01/14/15 0446 01/14/15 1600 01/15/15 0404  CALCIUM 8.6* 8.5* 8.5*  MG 2.3 2.4 2.4  PHOS 4.2 4.0 4.6   Sepsis Markers  Recent Labs Lab 01/09/15 0122 01/10/15 1100 01/11/15 0501 01/12/15 0411  LATICACIDVEN 2.46* 2.0  --   --   PROCALCITON  --  <0.10 0.27 0.25   ABG  Recent Labs Lab 01/10/15 0150 01/10/15 0602 01/10/15 1155  PHART 7.297* 7.286* 7.421  PCO2ART  66.5* 64.1* 44.4  PO2ART 62.5* 61.9* 75.0*   Liver Enzymes  Recent Labs Lab 01/09/15 0103 01/12/15 0411  AST 26 24  ALT 17 13*  ALKPHOS 100 62  BILITOT 0.7 0.9  ALBUMIN 3.5 2.6*   Cardiac Enzymes  Recent Labs Lab 01/10/15 1100 01/10/15 1519 01/10/15 2240  TROPONINI 4.22* 17.13* 16.25*   Glucose  Recent Labs Lab 01/14/15 1155 01/14/15 1624 01/14/15 2025 01/14/15 2330 01/15/15 0319 01/15/15 0736  GLUCAP 120* 142* 108* 137* 128* 137*    Imaging Dg Chest Port 1 View  01/15/2015   CLINICAL DATA:  Intubation.  EXAM: PORTABLE CHEST - 1 VIEW  COMPARISON:  01/14/2015 .  FINDINGS: Endotracheal tube, NG tube, left IJ line in stable position. Cardiomegaly with pulmonary vascular prominence and progressive bilateral bilateral infiltrates. Bilateral small pleural effusions. Findings consistent with progressive congestive heart failure. Bibasilar pneumonia cannot be excluded. Low lung volumes with bibasilar atelectasis. No pneumothorax.  IMPRESSION: 1.  Lines and tubes in stable position.  2. Progressive changes of congestive heart failure and pulmonary edema. Superimposed pneumonia cannot be excluded.  3.  Low lung volumes with bibasilar atelectasis.   Electronically Signed   By: Maisie Fus  Register   On: 01/15/2015 07:59     ASSESSMENT / PLAN:  PULMONARY OETT 6/19 >>> A: Acute Respiratory Failure  Concern is pulm edema  From AS, ischemia Increased bilateral diffuse patchy opacifiction -worsening 6/23 since diuresis held Never Smoker  OSA  P:   -VAP protocol -lasix to remain, did well with increase dosage -70% noted, abg now, likely can re reduce, may need peep increase -unable to wean with 70% as of now  CARDIOVASCULAR CVL L IJ 6/19 >>  A:  Hypotension - cardiogenic shock- sepsis unlikely given culture data Aortic Stenosis - worsening of valvular disease, d/w cards CAD s/p PCI (remote) PVD s/p Arteriography 6/17 per Dr. Edilia Bo  NSTEMI P:  -ICU monitoring -Heparin  gtt continue until PCI ready - per cards, although echo with normal LV fxn -ASA, Plavix  -lasix maintain  RENAL A:   Acute on Chronic Kidney Injury Tolerated lasi xincrease  P:   -lasix 80 q6h, keep  -Chem in am -kvo  GASTROINTESTINAL / GU  A:   GERD BPH - difficult to place catheter Constipation-kub 6/22 no obstruction, small BM P:   -TF to goal -ppi - miralax -colace bid -lactulose -repeat dulc supp  HEMATOLOGIC A:  DVt prevention Thrombocytopenia, noted low in past- plt stable 6/23 P:  DVT prophylaxis: hep drip Cbc in am on hep  INFECTIOUS A:   Bibasilar Airspace Disease - r/o HCAP  Recent UTI Fever noted  Pct neg x 3 P:   BCx2 6/19 >> No blood cx collected  Sputum 6/19 >> Likely OPF UA 6/19 >>  UC 6/19 >>Pseudomonas Aeruginosa 20 K (  doubt pathogen)  Ceftriaxone>>>6/22 for 4 days Vanco, start date 6/19>>>6/22  Zosyn, start date 6/19>>>6/22  ENDOCRINE A:   Mild Hyperglycemia  P:   Continue SSI Q4 blood glucose checks  NEUROLOGIC A:  Acute Encephalopathy- MR brain 6/22 negative for infarct Chronic facial twitch P:   RASS goal: -1 Now on propofol gtt for sedation - calm, best he has been Fentanyl gtt for pain, WUA  PRN versed for sedation Avoid anti dop   FAMILY  - Updates: Family updated at beside.    - Inter-disciplinary family meet or Palliative Care meeting due by: 6/27   Ccm time 30 min   Mcarthur Rossetti. Tyson Alias, MD, FACP Pgr: [phone] [facility]

## 2015-01-15 NOTE — Progress Notes (Signed)
eLink Physician-Brief Progress Note Patient Name: [name] DOB: 03/07/1933 MRN: [id number]   Date of Service  01/15/2015  HPI/Events of Note  Call from nurse reporting drop in sats while patient on vent.  Current sats of 89% on 70% - this was increased and peep of 8.  Attempted bagging patient and was able to increase sats to 95% only.  Patient is adequately sedated and BP is stable.  eICU Interventions  Plan: 1. Recruitment while on vent 2. Consider ABG and PCXR     Intervention Category Intermediate Interventions: Respiratory distress - evaluation and management  [name] 01/15/2015, 3:30 AM

## 2015-01-15 NOTE — Progress Notes (Signed)
ANTICOAGULATION CONSULT NOTE - Follow Up Consult  Pharmacy Consult for Heparin  Indication: chest pain/ACS  Allergies  Allergen Reactions  . Statins Other (See Comments)    Severe leg myalgias, weakness   Patient Measurements: Height: 5\' 9"  (175.3 cm) Weight: 253 lb 1.4 oz (114.8 kg) IBW/kg (Calculated) : 70.7  Vital Signs: Temp: 99.2 F (37.3 C) (06/23 2022) Temp Source: Oral (06/23 2022) BP: 124/42 mmHg (06/24 0400) Pulse Rate: 99 (06/24 0400)  Labs:  Recent Labs  01/12/15 0927  01/13/15 0450 01/14/15 0446 01/14/15 1330 01/14/15 1600 01/15/15 0404 01/15/15 0405  HGB  --   < > 12.6* 11.5*  --   --  12.1*  --   HCT  --   --  41.3 36.8*  --   --  39.6  --   PLT  --   --  93* 107*  --   --  122*  --   HEPARINUNFRC  --   < > 0.36 0.27* 0.42  --   --  0.25*  CREATININE  --   < > 1.83* 1.59*  --  1.46*  --   --   CKTOTAL 87  --  60  --   --   --   --   --   < > = values in this interval not displayed.  Estimated Creatinine Clearance: 51.2 mL/min (by C-G formula based on Cr of 1.46).  Assessment: Sub-therapeutic heparin level, no issue per RN.   Goal of Therapy:  Heparin level 0.3-0.7 units/ml Monitor platelets by anticoagulation protocol: Yes   Plan:  -Increase heparin to 2450 units/hr -1300 HL -Daily CBC/HL -Monitor for bleeding -F/U cath plans  Abran Duke 01/15/2015,4:44 AM

## 2015-01-15 NOTE — Progress Notes (Signed)
Patient's BMET k at 1600 was 3.7, concerned about decreasing k since patient receiving 80 mg of lasix q6hrs. Notified MD Mcquaid in regards to this. Ordered to give 40 meq of k per OG tube. Will continue to monitor and assess.

## 2015-01-15 NOTE — Progress Notes (Signed)
Recruitment performed. Vitals remained stable SpO2 increased to 94% during recruitment. Pt placed back on previous settings

## 2015-01-15 NOTE — Progress Notes (Signed)
Subjective:  Not able to get history as remains intubated. Essentially unchanged from yesterday  Objective:  Vital Signs in the last 24 hours: BP 141/65 mmHg  Pulse 89  Temp(Src) 99.2 F (37.3 C) (Oral)  Resp 17  Ht 5\' 9"  (1.753 m)  Wt 116.2 kg (256 lb 2.8 oz)  BMI 37.81 kg/m2  SpO2 97%  Physical Exam: Obese somewhat plethoric male intubated, opens eys to voice but does not follow commands Lungs:  Reduced breath sounds  Cardiac:  Regular rhythm, normal S1 and S2, no S3, harsh 3/6 holosystolic murmur Abdomen:  Soft, nontender, no masses, obese Extremities: trace+ edema noted, dry ulcer on tip of toe on right.  Intake/Output from previous day: 06/23 0701 - 06/24 0700 In: 2803.8 [I.V.:1648.8; NG/GT:925] Out: 3825 [Urine:3825]  Weight Filed Weights   01/13/15 0438 01/14/15 0500 01/15/15 0500  Weight: 114.5 kg (252 lb 6.8 oz) 114.8 kg (253 lb 1.4 oz) 116.2 kg (256 lb 2.8 oz)    Lab Results: Basic Metabolic Panel:  Recent Labs  78/29/56 1600 01/15/15 0404  NA 141 143  K 3.7 4.5  CL 101 99*  CO2 31 34*  GLUCOSE 132* 140*  BUN 54* 54*  CREATININE 1.46* 1.53*   CBC:  Recent Labs  01/14/15 0446 01/15/15 0404  WBC 4.8 6.2  HGB 11.5* 12.1*  HCT 36.8* 39.6  MCV 94.1 95.0  PLT 107* 122*   Cardiac Enzymes:  Cardiac Panel (last 3 results)  Recent Labs  01/12/15 0927 01/13/15 0450  CKTOTAL 87 60    Telemetry: Sinus rhythm  Assessment/Plan:  1.  Acute on chronic diastolic heart failure. Probably volume neutral now. Still persistent infiltrates may be pulmonary.  Not unreasonable to dry to diurese more.  2.  Aortic stenosis at least moderate and may be more severe-the aortic stenosis very well could be contributing to his repeated admissions for respiratory failure.  s 3.  Non-STEMI with elevation of troponin.  cath planned when stable.  Discussed duration of anticoagulation with Dr. Tyson Alias.  Not unreasonable to change to DVT prophylaxis at this point.  4.  Acute renal failure with recent contrast exposure as well as diuresis.  Appears stable today  5. Thrombocytopenia some improved today. 6. History of CAD with prior stent  Recommendations:  Awaiting general improvement prior to repeat cath.  OK to stop heparin.   Darden Palmer  MD [facility]  01/15/2015, 8:28 AM

## 2015-01-16 ENCOUNTER — Inpatient Hospital Stay (HOSPITAL_COMMUNITY): Payer: Medicare Other

## 2015-01-16 DIAGNOSIS — E785 Hyperlipidemia, unspecified: Secondary | ICD-10-CM

## 2015-01-16 DIAGNOSIS — Z789 Other specified health status: Secondary | ICD-10-CM

## 2015-01-16 DIAGNOSIS — Z978 Presence of other specified devices: Secondary | ICD-10-CM | POA: Insufficient documentation

## 2015-01-16 LAB — GLUCOSE, CAPILLARY
GLUCOSE-CAPILLARY: 152 mg/dL — AB (ref 65–99)
Glucose-Capillary: 118 mg/dL — ABNORMAL HIGH (ref 65–99)
Glucose-Capillary: 132 mg/dL — ABNORMAL HIGH (ref 65–99)
Glucose-Capillary: 140 mg/dL — ABNORMAL HIGH (ref 65–99)
Glucose-Capillary: 145 mg/dL — ABNORMAL HIGH (ref 65–99)
Glucose-Capillary: 156 mg/dL — ABNORMAL HIGH (ref 65–99)

## 2015-01-16 LAB — BASIC METABOLIC PANEL
Anion gap: 12 (ref 5–15)
BUN: 62 mg/dL — ABNORMAL HIGH (ref 6–20)
CALCIUM: 8.5 mg/dL — AB (ref 8.9–10.3)
CO2: 33 mmol/L — AB (ref 22–32)
Chloride: 98 mmol/L — ABNORMAL LOW (ref 101–111)
Creatinine, Ser: 1.49 mg/dL — ABNORMAL HIGH (ref 0.61–1.24)
GFR calc non Af Amer: 43 mL/min — ABNORMAL LOW (ref >60)
GFR, EST AFRICAN AMERICAN: 50 mL/min — AB (ref >60)
Glucose, Bld: 136 mg/dL — ABNORMAL HIGH (ref 65–99)
Potassium: 3.9 mmol/L (ref 3.5–5.1)
Sodium: 143 mmol/L (ref 135–145)

## 2015-01-16 LAB — HEPARIN LEVEL (UNFRACTIONATED): Heparin Unfractionated: 0.38 IU/mL (ref 0.30–0.70)

## 2015-01-16 LAB — TRIGLYCERIDES: Triglycerides: 312 mg/dL — ABNORMAL HIGH (ref <150)

## 2015-01-16 MED ORDER — HEPARIN (PORCINE) IN NACL 100-0.45 UNIT/ML-% IJ SOLN
2250.0000 [IU]/h | INTRAMUSCULAR | Status: DC
  Administered 2015-01-16 – 2015-01-18 (×5): 2500 [IU]/h via INTRAVENOUS
  Administered 2015-01-19: 2250 [IU]/h via INTRAVENOUS
  Filled 2015-01-16 (×9): qty 250

## 2015-01-16 MED ORDER — PROPOFOL 1000 MG/100ML IV EMUL
0.0000 ug/kg/min | INTRAVENOUS | Status: DC
  Administered 2015-01-16: 35 ug/kg/min via INTRAVENOUS
  Administered 2015-01-16: 40 ug/kg/min via INTRAVENOUS
  Administered 2015-01-16: 25 ug/kg/min via INTRAVENOUS
  Administered 2015-01-16 – 2015-01-17 (×2): 40 ug/kg/min via INTRAVENOUS
  Administered 2015-01-17: 25 ug/kg/min via INTRAVENOUS
  Administered 2015-01-17: 30 ug/kg/min via INTRAVENOUS
  Administered 2015-01-17 (×2): 40 ug/kg/min via INTRAVENOUS
  Administered 2015-01-18 (×5): 30 ug/kg/min via INTRAVENOUS
  Administered 2015-01-19: 25 ug/kg/min via INTRAVENOUS
  Filled 2015-01-16 (×5): qty 100
  Filled 2015-01-16: qty 200
  Filled 2015-01-16 (×2): qty 100
  Filled 2015-01-16: qty 200
  Filled 2015-01-16 (×5): qty 100

## 2015-01-16 NOTE — Progress Notes (Signed)
ANTICOAGULATION CONSULT NOTE - Follow Up Consult  Pharmacy Consult for Heparin Indication: chest pain/ACS  Allergies  Allergen Reactions  . Statins Other (See Comments)    Severe leg myalgias, weakness    Patient Measurements: Height: 5\' 9"  (175.3 cm) Weight: 242 lb 1 oz (109.8 kg) IBW/kg (Calculated) : 70.7 Heparin Dosing Weight: ~95kg  Vital Signs: Temp: 98.4 F (36.9 C) (06/25 0821) Temp Source: Oral (06/25 0821) BP: 122/48 mmHg (06/25 0810) Pulse Rate: 77 (06/25 0810)  Labs:  Recent Labs  01/14/15 0446  01/15/15 0404  01/15/15 1255 01/15/15 1725 01/16/15 0500 01/16/15 0556  HGB 11.5*  --  12.1*  --   --   --   --   --   HCT 36.8*  --  39.6  --   --   --   --   --   PLT 107*  --  122*  --   --   --   --   --   HEPARINUNFRC 0.27*  < >  --   < > 0.45 0.46  --  0.38  CREATININE 1.59*  < > 1.53*  --   --  1.43* 1.49*  --   < > = values in this interval not displayed.  Estimated Creatinine Clearance: 49.1 mL/min (by C-G formula based on Cr of 1.49).   Medications:  Heparin @ 2450 units/hr  Assessment: 81yom continues on heparin for elevated troponin, likely demand ischemia, but cardiology considering cath at some point. Heparin level is therapeutic on the low end. No CBC today. No bleeding reported.  Goal of Therapy:  Heparin level 0.3-0.7 units/ml Monitor platelets by anticoagulation protocol: Yes   Plan:  1) Increase heparin to 2500 units/hr to keep therapeutic 2) Heparin level, CBC in AM  Fredrik Rigger 01/16/2015,9:09 AM

## 2015-01-16 NOTE — Progress Notes (Signed)
Patient ID: [name], male   DOB: 1933/07/11, 81 y.o.   MRN: [id number]     Subjective:    No events overnight  Objective:   Temp:  [97.6 F (36.4 C)-99.8 F (37.7 C)] 98.4 F (36.9 C) (06/25 0821) Pulse Rate:  [66-121] 109 (06/25 1000) Resp:  [11-31] 17 (06/25 1000) BP: (105-166)/(39-77) 144/60 mmHg (06/25 0900) SpO2:  [91 %-99 %] 96 % (06/25 1000) Arterial Line BP: (128-165)/(68-72) 165/72 mmHg (06/24 1400) FiO2 (%):  [50 %-70 %] 60 % (06/25 0810) Weight:  [242 lb 1 oz (109.8 kg)] 242 lb 1 oz (109.8 kg) (06/25 0500) Last BM Date: 01/14/15  Filed Weights   01/14/15 0500 01/15/15 0500 01/16/15 0500  Weight: 253 lb 1.4 oz (114.8 kg) 256 lb 2.8 oz (116.2 kg) 242 lb 1 oz (109.8 kg)    Intake/Output Summary (Last 24 hours) at 01/16/15 1103 Last data filed at 01/16/15 1000  Gross per 24 hour  Intake 1941.6 ml  Output   4860 ml  Net -2918.4 ml    Telemetry: SR and sinus tach  Exam:  General: NAD  Resp: Clear anteriorally  Cardiac: RRR, 3/6 systolic murmur RUSB  GI: abdomen soft, NT, ND  MSK: no LE edema  Neuro: no focal deficits   Lab Results:  Basic Metabolic Panel:  Recent Labs Lab 01/14/15 0446 01/14/15 1600 01/15/15 0404 01/15/15 1725 01/16/15 0500  NA 142 141 143 146* 143  K 4.1 3.7 4.5 3.9 3.9  CL 102 101 99* 99* 98*  CO2 34* 31 34* 36* 33*  GLUCOSE 135* 132* 140* 128* 136*  BUN 52* 54* 54* 58* 62*  CREATININE 1.59* 1.46* 1.53* 1.43* 1.49*  CALCIUM 8.6* 8.5* 8.5* 9.3 8.5*  MG 2.3 2.4 2.4  --   --     Liver Function Tests:  Recent Labs Lab 01/12/15 0411  AST 24  ALT 13*  ALKPHOS 62  BILITOT 0.9  PROT 6.3*  ALBUMIN 2.6*    CBC:  Recent Labs Lab 01/13/15 0450 01/14/15 0446 01/15/15 0404  WBC 5.5 4.8 6.2  HGB 12.6* 11.5* 12.1*  HCT 41.3 36.8* 39.6  MCV 94.9 94.1 95.0  PLT 93* 107* 122*    Cardiac Enzymes:  Recent Labs Lab 01/10/15 1100 01/10/15 1519 01/10/15 2240 01/12/15 0927 01/13/15 0450  CKTOTAL  --    --   --  87 60  TROPONINI 4.22* 17.13* 16.25*  --   --     BNP:  Recent Labs  05/28/14 1508 07/10/14 0801 07/13/14 1928  PROBNP 477.3* 422.7 814.6*    Coagulation: No results for input(s): INR in the last 168 hours.  ECG:   Medications:   Scheduled Medications: . antiseptic oral rinse  7 mL Mouth Rinse QID  . aspirin  81 mg Per Tube Daily  . cefTRIAXone (ROCEPHIN)  IV  1 g Intravenous Q24H  . chlorhexidine  15 mL Mouth Rinse BID  . clopidogrel  75 mg Oral Daily  . docusate  100 mg Per Tube BID  . feeding supplement (PRO-STAT SUGAR FREE 64)  60 mL Per Tube QID  . feeding supplement (VITAL HIGH PROTEIN)  1,000 mL Per Tube Q24H  . furosemide  120 mg Intravenous Q6H  . lactulose  30 g Oral TID  . magnesium oxide  400 mg Oral Daily  . pantoprazole sodium  40 mg Per Tube Daily  . polyethylene glycol  17 g Oral Daily     Infusions: . fentaNYL infusion INTRAVENOUS 250 mcg/hr (  01/16/15 0130)  . heparin 2,500 Units/hr (01/16/15 0915)  . propofol (DIPRIVAN) infusion 35 mcg/kg/min (01/16/15 0950)     PRN Medications:  acetaminophen, albuterol, bisacodyl, fentaNYL, midazolam, sodium chloride     Assessment/Plan   1. Acute on chronic diastolic heart failure - 12/2014 echo LVEF 60-65%, grade II diastolic dysfunction, moderate aortic stenosis mean grad 32 however AVA reportedly 0.64 - negative 3.9 liters yesterday, negative 2.5 liters since admission. Currently on lasix  every 6 hours. Renal function overall stable - 01/16/15 CXR improved aeration - continue diuresis   2. NSTEMI - peak trop 17, trending down. Echo with normal LVEF - plan for cath once more stable from medical standpoint,  - medical therapy with ASA, plavix, hep gtt. Has statin allergy.  - stable bp but soft at times, will not start beta blocker or ACE at this time.   3. Aortic stenosis - will review echo, severe by reported area by moderate by gradient.  - will need RHC and AV study with cath.      4. Respiratory failure - management per ICU team.   Dina Rich, M.D.

## 2015-01-16 NOTE — Progress Notes (Signed)
PULMONARY / CRITICAL CARE MEDICINE   Name: [name] MRN: [id number] DOB: [birth year]    ADMISSION DATE:  01/09/2015 CONSULTATION DATE:  01/10/15  REFERRING MD :  Dr. Gwenlyn Perking   CHIEF COMPLAINT:  AMS, Acute Respiratory Failure   INITIAL PRESENTATION: 81 y/o M with with recent (6/17) arteriography of RLE for venous ulcers who was admitted 6/18 with a 2 week hx of SOB.  Admitted per [facility] for further evaluation.  Treated with BiPAP but failed therapy and required intubation am of 6/19.  PCCM consulted for ICU transfer.    STUDIES:  6/18  CT ABD >> post-op changes in L groin, increased density in bladder, bibasilar atx 6/18  ECHO >> LVEF 55-60%, grade II diastolic dysfunction, moderate AS (wosening since last ECHO) 6/20 EEG>>>metabolic vs drug affect, no focus  6/19  CT of Head >> Stable moderate diffuse cerebral atrophy and mild chronic small vessel white matter ischemic changes 6/20 Echo: Left ventricle: The cavity size was normal. Wall thickness was normal. Systolic function was normal. The estimated ejection fraction was in the range of 60% to 65%. Wall motion was normal;there were no regional wall motion abnormalities. 6/22: MR Brain: No infarct, age related atrophy, mild chronic small vessel disease  SIGNIFICANT EVENTS: 6/17  RLE arteriography per Dr. Edilia Bo for PVD and non-healing R toe wound 6/18  Admit to [facility] with complaints of SOB x 2 weeks 6/19  Decompensated early am, required intubation / ICU transfer 6/20-trop elevation, hep , asa 6/21: polymyoclonus with when precedex stopped 6/22: continued plt count drop to 93 down from 131,agitation required change in sedation to prop 6/23: worsening chest film, plt stable  SUBJECTIVE: some improvement to fio2, neg 3.7 liters  VITAL SIGNS: Temp:  [97.6 F (36.4 C)-99.8 F (37.7 C)] 98.4 F (36.9 C) (06/25 0821) Pulse Rate:  [66-121] 93 (06/25 0900) Resp:  [11-31] 15 (06/25 0900) BP: (105-166)/(39-77) 144/60 mmHg (06/25  0900) SpO2:  [91 %-99 %] 97 % (06/25 0900) Arterial Line BP: (118-165)/(47-72) 165/72 mmHg (06/24 1400) FiO2 (%):  [50 %-70 %] 60 % (06/25 0810) Weight:  [109.8 kg (242 lb 1 oz)] 109.8 kg (242 lb 1 oz) (06/25 0500)   HEMODYNAMICS:     VENTILATOR SETTINGS: Vent Mode:  [-] PRVC FiO2 (%):  [50 %-70 %] 60 % Set Rate:  [12 bmp] 12 bmp Vt Set:  [560 mL] 560 mL PEEP:  [10 cmH20] 10 cmH20 Plateau Pressure:  [24 cmH20-36 cmH20] 24 cmH20   INTAKE / OUTPUT:  Intake/Output Summary (Last 24 hours) at 01/16/15 9983 Last data filed at 01/16/15 0600  Gross per 24 hour  Intake 2099.73 ml  Output   5835 ml  Net -3735.27 ml    PHYSICAL EXAMINATION: General:  Obese male, sedated, lying in bed Neuro:  rass -1, follows commands, int restlessness HEENT:  Right Pupil 18mm, jvd better Cardiovascular:  s1s2 rrr, 3/6 harsh systolic murmur heard best at 2nd ISC RSB - no chang ein quality Lungs: coarse throughout, mild Abdomen:  Obese/soft, bsx4 active, mild distention, small BM noted Musculoskeletal:  No acute deformities  Skin:  Warm/dry,gen BLE edema  LABS:  CBC  Recent Labs Lab 01/13/15 0450 01/14/15 0446 01/15/15 0404  WBC 5.5 4.8 6.2  HGB 12.6* 11.5* 12.1*  HCT 41.3 36.8* 39.6  PLT 93* 107* 122*   Coag's No results for input(s): APTT, INR in the last 168 hours.   BMET  Recent Labs Lab 01/15/15 0404 01/15/15 1725 01/16/15 0500  NA 143 146*  143  K 4.5 3.9 3.9  CL 99* 99* 98*  CO2 34* 36* 33*  BUN 54* 58* 62*  CREATININE 1.53* 1.43* 1.49*  GLUCOSE 140* 128* 136*   Electrolytes  Recent Labs Lab 01/14/15 0446 01/14/15 1600 01/15/15 0404 01/15/15 1725 01/16/15 0500  CALCIUM 8.6* 8.5* 8.5* 9.3 8.5*  MG 2.3 2.4 2.4  --   --   PHOS 4.2 4.0 4.6  --   --    Sepsis Markers  Recent Labs Lab 01/10/15 1100 01/11/15 0501 01/12/15 0411  LATICACIDVEN 2.0  --   --   PROCALCITON <0.10 0.27 0.25   ABG  Recent Labs Lab 01/10/15 0602 01/10/15 1155 01/15/15 1117   PHART 7.286* 7.421 7.387  PCO2ART 64.1* 44.4 64.0*  PO2ART 61.9* 75.0* 71.0*   Liver Enzymes  Recent Labs Lab 01/12/15 0411  AST 24  ALT 13*  ALKPHOS 62  BILITOT 0.9  ALBUMIN 2.6*   Cardiac Enzymes  Recent Labs Lab 01/10/15 1100 01/10/15 1519 01/10/15 2240  TROPONINI 4.22* 17.13* 16.25*   Glucose  Recent Labs Lab 01/15/15 1150 01/15/15 1554 01/15/15 2020 01/15/15 2340 01/16/15 0320 01/16/15 0823  GLUCAP 140* 118* 151* 118* 140* 132*    Imaging Dg Chest Port 1 View  01/16/2015   CLINICAL DATA:  Pulmonary edema  EXAM: PORTABLE CHEST - 1 VIEW  COMPARISON:  01/15/2015  FINDINGS: Cardiac shadow is stable. The endotracheal tube and nasogastric catheter are stable in position. A left jugular line is again noted in the proximal superior vena cava. No pneumothorax is noted. The inspiratory effort is poor but somewhat improved from the prior exam. Improved aeration is noted in the bases bilaterally.  IMPRESSION: Overall poor inspiratory effort but improved aeration in the basis when compared with the prior exam.  Tubes and lines stable in appearance.   Electronically Signed   By: Alcide Clever M.D.   On: 01/16/2015 07:25     ASSESSMENT / PLAN:  PULMONARY OETT 6/19 >>> A: Acute Respiratory Failure  Concern is pulm edema  From AS, ischemia Increased bilateral diffuse patchy opacifiction -improved finally agfter 4 liters neg success Never Smoker  OSA  P:   -pcxr better with neg 4 liters -if to 50% then peep reduction -unable to wean until peep 5 -keep same MV -keep lasix  CARDIOVASCULAR CVL L IJ 6/19 >>  A:  Hypotension - cardiogenic shock- sepsis unlikely given culture data Aortic Stenosis - worsening of valvular disease, d/w cards CAD s/p PCI (remote) PVD s/p Arteriography 6/17 per Dr. Edilia Bo  NSTEMI P:  -ICU monitoring -Heparin gtt continue until PCI ready - per cards, although echo with normal LV fxn -ASA, Plavix  -lasix maintain, this is what has  finally improved pcxr and O2 needs  RENAL A:   Acute on Chronic Kidney Injury Tolerated lasi xincrease  P:   -lasix 80 q6h, keep same dose -Chem in am Mag, phos in am  -kvo  GASTROINTESTINAL / GU  A:   GERD BPH - difficult to place catheter Constipation-kub 6/22 no obstruction, IMproved finally  P:   -TF to goal -ppi - miralax -colace bid -lactulose, dc in am likely  HEMATOLOGIC A:  DVt prevention Thrombocytopenia (dilutional), noted low in past- plt resolving now with lasix P:  DVT prophylaxis: hep drip Cbc in am on hep  INFECTIOUS A:   Bibasilar Airspace Disease - r/o HCAP  Recent UTI Fever noted  Pct neg x 3 P:   BCx2 6/19 >> No blood cx  collected  Sputum 6/19 >> Likely OPF UA 6/19 >>  UC 6/19 >>Pseudomonas Aeruginosa 20 K (doubt pathogen)  Ceftriaxone>>>6/22 for 4 days Vanco, start date 6/19>>>6/22  Zosyn, start date 6/19>>>6/22  ENDOCRINE A:   Controlled Hyperglycemia  P:   Continue SSI Q4 blood glucose checks  NEUROLOGIC A:  Acute Encephalopathy- MR brain 6/22 negative for infarct Chronic facial twitch P:   RASS goal: -1 propofol gtt for sedation - calm, best he has been - WUA Fentanyl gtt for pain  PRN versed for sedation Avoid anti dop   FAMILY  - Updates: Family updated at beside.    - Inter-disciplinary family meet or Palliative Care meeting due by: 6/27   Ccm time 30 min   Mcarthur Rossetti. Tyson Alias, MD, FACP Pgr: [phone] [facility]

## 2015-01-17 ENCOUNTER — Inpatient Hospital Stay (HOSPITAL_COMMUNITY): Payer: Medicare Other

## 2015-01-17 DIAGNOSIS — I110 Hypertensive heart disease with heart failure: Secondary | ICD-10-CM

## 2015-01-17 DIAGNOSIS — I509 Heart failure, unspecified: Secondary | ICD-10-CM

## 2015-01-17 LAB — BASIC METABOLIC PANEL
ANION GAP: 11 (ref 5–15)
BUN: 72 mg/dL — AB (ref 6–20)
CO2: 39 mmol/L — AB (ref 22–32)
Calcium: 9.1 mg/dL (ref 8.9–10.3)
Chloride: 96 mmol/L — ABNORMAL LOW (ref 101–111)
Creatinine, Ser: 1.58 mg/dL — ABNORMAL HIGH (ref 0.61–1.24)
GFR calc Af Amer: 46 mL/min — ABNORMAL LOW (ref >60)
GFR, EST NON AFRICAN AMERICAN: 40 mL/min — AB (ref >60)
GLUCOSE: 140 mg/dL — AB (ref 65–99)
POTASSIUM: 3.7 mmol/L (ref 3.5–5.1)
SODIUM: 146 mmol/L — AB (ref 135–145)

## 2015-01-17 LAB — CBC
HCT: 43.5 % (ref 39.0–52.0)
HEMOGLOBIN: 13.4 g/dL (ref 13.0–17.0)
MCH: 29.6 pg (ref 26.0–34.0)
MCHC: 30.8 g/dL (ref 30.0–36.0)
MCV: 96 fL (ref 78.0–100.0)
Platelets: 155 10*3/uL (ref 150–400)
RBC: 4.53 MIL/uL (ref 4.22–5.81)
RDW: 15.8 % — ABNORMAL HIGH (ref 11.5–15.5)
WBC: 7.9 10*3/uL (ref 4.0–10.5)

## 2015-01-17 LAB — GLUCOSE, CAPILLARY
GLUCOSE-CAPILLARY: 164 mg/dL — AB (ref 65–99)
Glucose-Capillary: 146 mg/dL — ABNORMAL HIGH (ref 65–99)
Glucose-Capillary: 127 mg/dL — ABNORMAL HIGH (ref 65–99)
Glucose-Capillary: 158 mg/dL — ABNORMAL HIGH (ref 65–99)
Glucose-Capillary: 167 mg/dL — ABNORMAL HIGH (ref 65–99)
Glucose-Capillary: 153 mg/dL — ABNORMAL HIGH (ref 65–99)

## 2015-01-17 LAB — PHOSPHORUS: Phosphorus: 2.6 mg/dL (ref 2.5–4.6)

## 2015-01-17 LAB — HEPARIN LEVEL (UNFRACTIONATED): Heparin Unfractionated: 0.55 IU/mL (ref 0.30–0.70)

## 2015-01-17 LAB — MAGNESIUM: MAGNESIUM: 2.7 mg/dL — AB (ref 1.7–2.4)

## 2015-01-17 MED ORDER — LABETALOL HCL 5 MG/ML IV SOLN
10.0000 mg | Freq: Once | INTRAVENOUS | Status: AC
  Administered 2015-01-17: 10 mg via INTRAVENOUS
  Filled 2015-01-17: qty 4

## 2015-01-17 NOTE — Progress Notes (Signed)
ANTICOAGULATION CONSULT NOTE - Follow Up Consult  Pharmacy Consult for Heparin Indication: chest pain/ACS  Allergies  Allergen Reactions  . Statins Other (See Comments)    Severe leg myalgias, weakness    Patient Measurements: Height: 5\' 9"  (175.3 cm) Weight: 242 lb 11.6 oz (110.1 kg) IBW/kg (Calculated) : 70.7 Heparin Dosing Weight: ~95kg  Vital Signs: Temp: 99.9 F (37.7 C) (06/26 0746) Temp Source: Oral (06/26 0357) BP: 160/62 mmHg (06/26 0746) Pulse Rate: 106 (06/26 0746)  Labs:  Recent Labs  01/15/15 0404  01/15/15 1725 01/16/15 0500 01/16/15 0556 01/17/15 0500  HGB 12.1*  --   --   --   --  13.4  HCT 39.6  --   --   --   --  43.5  PLT 122*  --   --   --   --  155  HEPARINUNFRC  --   < > 0.46  --  0.38 0.55  CREATININE 1.53*  --  1.43* 1.49*  --  1.58*  < > = values in this interval not displayed.  Estimated Creatinine Clearance: 46.4 mL/min (by C-G formula based on Cr of 1.58).   Medications:  Heparin @ 2500 units/hr  Assessment: 81yom continues on heparin for elevated troponin, likely demand ischemia, but cardiology considering cath at some point. Heparin level is therapeutic. CBC is stable. No bleeding reported.  Goal of Therapy:  Heparin level 0.3-0.7 units/ml Monitor platelets by anticoagulation protocol: Yes   Plan:  1) Continue heparin at 2500 units/hr  2) Heparin level, CBC in AM  Ameilia Rattan, Hessie Diener 01/17/2015,10:41 AM

## 2015-01-17 NOTE — Progress Notes (Signed)
PULMONARY / CRITICAL CARE MEDICINE   Name: [name] MRN: [id number] DOB: [birth year]    ADMISSION DATE:  01/09/2015 CONSULTATION DATE:  01/10/15  REFERRING MD :  Dr. Gwenlyn Perking   CHIEF COMPLAINT:  AMS, Acute Respiratory Failure   INITIAL PRESENTATION: 81 y/o M with with recent (6/17) arteriography of RLE for venous ulcers who was admitted 6/18 with a 2 week hx of SOB.  Admitted per [facility] for further evaluation.  Treated with BiPAP but failed therapy and required intubation am of 6/19.  PCCM consulted for ICU transfer.    STUDIES:  6/18  CT ABD >> post-op changes in L groin, increased density in bladder, bibasilar atx 6/18  ECHO >> LVEF 55-60%, grade II diastolic dysfunction, moderate AS (wosening since last ECHO) 6/20 EEG>>>metabolic vs drug affect, no focus  6/19  CT of Head >> Stable moderate diffuse cerebral atrophy and mild chronic small vessel white matter ischemic changes 6/20 Echo: Left ventricle: The cavity size was normal. Wall thickness was normal. Systolic function was normal. The estimated ejection fraction was in the range of 60% to 65%. Wall motion was normal;there were no regional wall motion abnormalities. 6/22: MR Brain: No infarct, age related atrophy, mild chronic small vessel disease  SIGNIFICANT EVENTS: 6/17  RLE arteriography per Dr. Edilia Bo for PVD and non-healing R toe wound 6/18  Admit to [facility] with complaints of SOB x 2 weeks 6/19  Decompensated early am, required intubation / ICU transfer 6/20-trop elevation, hep , asa 6/21: polymyoclonus with when precedex stopped 6/22: continued plt count drop to 93 down from 131,agitation required change in sedation to prop 6/23: worsening chest film, plt stable  SUBJECTIVE: neg balance  VITAL SIGNS: Temp:  [98.7 F (37.1 C)-100.5 F (38.1 C)] 99.9 F (37.7 C) (06/26 0746) Pulse Rate:  [72-114] 106 (06/26 0746) Resp:  [12-17] 12 (06/26 0746) BP: (113-170)/(40-90) 160/62 mmHg (06/26 0746) SpO2:  [92 %-96 %] 93 %  (06/26 0600) FiO2 (%):  [40 %-50 %] 40 % (06/26 0746) Weight:  [110.1 kg (242 lb 11.6 oz)] 110.1 kg (242 lb 11.6 oz) (06/26 0500)   HEMODYNAMICS:     VENTILATOR SETTINGS: Vent Mode:  [-] PRVC FiO2 (%):  [40 %-50 %] 40 % Set Rate:  [12 bmp] 12 bmp Vt Set:  [560 mL] 560 mL PEEP:  [5 cmH20-10 cmH20] 5 cmH20 Plateau Pressure:  [22 cmH20-26 cmH20] 22 cmH20   INTAKE / OUTPUT:  Intake/Output Summary (Last 24 hours) at 01/17/15 1013 Last data filed at 01/17/15 0600  Gross per 24 hour  Intake 1636.2 ml  Output   2850 ml  Net -1213.8 ml    PHYSICAL EXAMINATION: General:  Obese male Neuro:  rass 0 , fc HEENT:  Right Pupil 2mm Cardiovascular:  s1s2 rrr, 3/6 harsh systolic murmur not changed Lungs: coarse throughout subtle Abdomen:  Obese/soft, bsx4 active, mild distention much better, small BM noted Musculoskeletal:  No acute deformities  Skin:  Warm/dry,gen BLE edema  LABS:  CBC  Recent Labs Lab 01/14/15 0446 01/15/15 0404 01/17/15 0500  WBC 4.8 6.2 7.9  HGB 11.5* 12.1* 13.4  HCT 36.8* 39.6 43.5  PLT 107* 122* 155   Coag's No results for input(s): APTT, INR in the last 168 hours.   BMET  Recent Labs Lab 01/15/15 1725 01/16/15 0500 01/17/15 0500  NA 146* 143 146*  K 3.9 3.9 3.7  CL 99* 98* 96*  CO2 36* 33* 39*  BUN 58* 62* 72*  CREATININE 1.43* 1.49* 1.58*  GLUCOSE 128* 136* 140*   Electrolytes  Recent Labs Lab 01/14/15 1600 01/15/15 0404 01/15/15 1725 01/16/15 0500 01/17/15 0500  CALCIUM 8.5* 8.5* 9.3 8.5* 9.1  MG 2.4 2.4  --   --  2.7*  PHOS 4.0 4.6  --   --  2.6   Sepsis Markers  Recent Labs Lab 01/10/15 1100 01/11/15 0501 01/12/15 0411  LATICACIDVEN 2.0  --   --   PROCALCITON <0.10 0.27 0.25   ABG  Recent Labs Lab 01/10/15 1155 01/15/15 1117  PHART 7.421 7.387  PCO2ART 44.4 64.0*  PO2ART 75.0* 71.0*   Liver Enzymes  Recent Labs Lab 01/12/15 0411  AST 24  ALT 13*  ALKPHOS 62  BILITOT 0.9  ALBUMIN 2.6*   Cardiac  Enzymes  Recent Labs Lab 01/10/15 1100 01/10/15 1519 01/10/15 2240  TROPONINI 4.22* 17.13* 16.25*   Glucose  Recent Labs Lab 01/16/15 1229 01/16/15 1657 01/16/15 1944 01/16/15 2347 01/17/15 0355 01/17/15 0825  GLUCAP 152* 145* 156* 146* 127* 158*    Imaging Dg Chest Port 1 View  01/17/2015   CLINICAL DATA:  Pulmonary edema. History of CHF, diabetes, hypertension, renal insufficiency, and CAD.  EXAM: PORTABLE CHEST - 1 VIEW  COMPARISON:  01/16/2015  FINDINGS: Endotracheal tube is in place with tip approximately 6 cm above carina. The nasogastric tube is in place with tip off the film but beyond the gastroesophageal junction. Left IJ central line tip overlies the level of the brachiocephalic-SVC confluence.  Patient is rotated. Heart is enlarged. There is persistent density at the left lung base better seen or slightly increased. Suspect left pleural effusion.  IMPRESSION: 1. Cardiomegaly. 2. Left lower lobe opacity persists.   Electronically Signed   By: Norva Pavlov M.D.   On: 01/17/2015 09:04     ASSESSMENT / PLAN:  PULMONARY OETT 6/19 >>> A: Acute Respiratory Failure  Concern is pulm edema  From AS, ischemia Increased bilateral diffuse patchy opacifiction -improved finally agfter 4 liters neg success Never Smoker  OSA  P:   -with lasix finally neg x 2 days, to succes 40% peep 5 -wean this am , first attempt, high rate, rsbi, back to rest -if infiltrates remain despite lasix, would consider CT chest -keep lasix -treating abdo distention  CARDIOVASCULAR CVL L IJ 6/19 >>  A:  Hypotension - cardiogenic shock- sepsis unlikely given culture data Aortic Stenosis - worsening of valvular disease, d/w cards CAD s/p PCI (remote) PVD s/p Arteriography 6/17 per Dr. Edilia Bo  NSTEMI P:  -ICU monitoring -Heparin gtt continue until PCI ready - per cards -ASA, Plavix  -lasix with pcxr follow up  RENAL A:   Acute on Chronic Kidney Injury Tolerated lasi xincrease  P:    -lasix 80 q6h, keep same dose plsu free water -Chem in am  GASTROINTESTINAL / GU  A:   GERD BPH - difficult to place catheter Constipation-kub 6/22 no obstruction, IMproved finally bigtime P:   -TF to goal -ppi - miralax dc -colace dc -lactulose, dc   HEMATOLOGIC A:  DVt prevention Thrombocytopenia (dilutional), noted low in past- plt resolving now with lasix P:  DVT prophylaxis: hep drip Cbc daily not needed  INFECTIOUS A:   Bibasilar Airspace Disease - r/o HCAP  Recent UTI Fever noted  Pct neg x 3 P:   BCx2 6/19 >> No blood cx collected  Sputum 6/19 >> Likely OPF UA 6/19 >>  UC 6/19 >>Pseudomonas Aeruginosa 20 K (doubt pathogen)  Ceftriaxone>>>6/22 for 4 days then dc Vanco,  start date 6/19>>>6/22  Zosyn, start date 6/19>>>6/22  ENDOCRINE A:   Controlled Hyperglycemia  P:   Continue SSI Q4 blood glucose checks  NEUROLOGIC A:  Acute Encephalopathy- MR brain 6/22 negative for infarct Chronic facial twitch P:   RASS goal: -1 propofol gtt wua Fentanyl gtt for pain  PRN versed for sedation Avoid anti dop   FAMILY  - Updates: Family updated at beside.    - Inter-disciplinary family meet or Palliative Care meeting due by: 6/26 done by DF  Ccm time 30 min   Mcarthur Rossetti. Tyson Alias, MD, FACP Pgr: [phone] [place] [facility]

## 2015-01-17 NOTE — Progress Notes (Addendum)
Patient ID: [name], male   DOB: 1933/03/12, 81 y.o.   MRN: [id number]     Subjective:    No events overnight.   Objective:   Temp:  [98.7 F (37.1 C)-100.5 F (38.1 C)] 99.9 F (37.7 C) (06/26 0746) Pulse Rate:  [41-132] 85 (06/26 1120) Resp:  [12-25] 13 (06/26 1120) BP: (109-170)/(40-90) 109/42 mmHg (06/26 1120) SpO2:  [89 %-96 %] 93 % (06/26 1120) FiO2 (%):  [40 %-50 %] 40 % (06/26 1120) Weight:  [242 lb 11.6 oz (110.1 kg)] 242 lb 11.6 oz (110.1 kg) (06/26 0500) Last BM Date: 01/14/15  Filed Weights   01/15/15 0500 01/16/15 0500 01/17/15 0500  Weight: 256 lb 2.8 oz (116.2 kg) 242 lb 1 oz (109.8 kg) 242 lb 11.6 oz (110.1 kg)    Intake/Output Summary (Last 24 hours) at 01/17/15 1155 Last data filed at 01/17/15 1104  Gross per 24 hour  Intake 1665.3 ml  Output   3300 ml  Net -1634.7 ml    Telemetry: Sinus rhythm and sinus tach, PVCs  Exam:  General: NAD  Resp: clear anteriorally  Cardiac: RRR, 3/6 systolic murmur RUSB,  GI: abdomen soft, NT, ND  MSK: no LE edema  Neuro:  sedated    Lab Results:  Basic Metabolic Panel:  Recent Labs Lab 01/14/15 1600 01/15/15 0404 01/15/15 1725 01/16/15 0500 01/17/15 0500  NA 141 143 146* 143 146*  K 3.7 4.5 3.9 3.9 3.7  CL 101 99* 99* 98* 96*  CO2 31 34* 36* 33* 39*  GLUCOSE 132* 140* 128* 136* 140*  BUN 54* 54* 58* 62* 72*  CREATININE 1.46* 1.53* 1.43* 1.49* 1.58*  CALCIUM 8.5* 8.5* 9.3 8.5* 9.1  MG 2.4 2.4  --   --  2.7*    Liver Function Tests:  Recent Labs Lab 01/12/15 0411  AST 24  ALT 13*  ALKPHOS 62  BILITOT 0.9  PROT 6.3*  ALBUMIN 2.6*    CBC:  Recent Labs Lab 01/14/15 0446 01/15/15 0404 01/17/15 0500  WBC 4.8 6.2 7.9  HGB 11.5* 12.1* 13.4  HCT 36.8* 39.6 43.5  MCV 94.1 95.0 96.0  PLT 107* 122* 155    Cardiac Enzymes:  Recent Labs Lab 01/10/15 1519 01/10/15 2240 01/12/15 0927 01/13/15 0450  CKTOTAL  --   --  87 60  TROPONINI 17.13* 16.25*  --   --      BNP:  Recent Labs  05/28/14 1508 07/10/14 0801 07/13/14 1928  PROBNP 477.3* 422.7 814.6*    Coagulation: No results for input(s): INR in the last 168 hours.  ECG:   Medications:   Scheduled Medications: . antiseptic oral rinse  7 mL Mouth Rinse QID  . aspirin  81 mg Per Tube Daily  . chlorhexidine  15 mL Mouth Rinse BID  . clopidogrel  75 mg Oral Daily  . feeding supplement (PRO-STAT SUGAR FREE 64)  60 mL Per Tube QID  . feeding supplement (VITAL HIGH PROTEIN)  1,000 mL Per Tube Q24H  . furosemide  120 mg Intravenous Q6H  . magnesium oxide  400 mg Oral Daily  . pantoprazole sodium  40 mg Per Tube Daily     Infusions: . fentaNYL infusion INTRAVENOUS 250 mcg/hr (01/17/15 1104)  . heparin 2,500 Units/hr (01/17/15 1104)  . propofol (DIPRIVAN) infusion 30 mcg/kg/min (01/17/15 1104)     PRN Medications:  acetaminophen, albuterol, fentaNYL, midazolam, sodium chloride     Assessment/Plan    1. Acute on chronic diastolic heart failure - 12/2014  echo LVEF 60-65%, grade II diastolic dysfunction, moderate aortic stenosis mean grad 32 however AVA reportedly 0.64 - negative 1.4 liters yesterday, negative 3.9 liters since admission. Currently on lasix  every 6 hours. Cr mild uptrend - 01/16/15 CXR improved aeration - CVP 10 per nursing report, continue diuresis.   2. NSTEMI - peak trop 17, trending down. Echo with normal LVEF - plan for cath once more stable from medical standpoint,  - medical therapy with ASA, plavix, hep gtt. Has statin allergy.  - stable bp but soft at times, will not start beta blocker or ACE at this time.   3. Aortic stenosis - will review echo, severe by reported area by moderate by gradient.  - will need RHC and AV study with cath when medically improved.    4. Respiratory failure - management per ICU team.     Dina Rich, M.D.  Echos reviewed, technically difficult images. AVA VTI 0.7, planimetry valve is around 0.7.  Dimensionless index if 0.3. Highest mean gradient from available angles of acquisition is 37 mmHg. Overall findings are somewhat mixed for moderate vs severe stenosis, but more favor toward severe in the spectrum. Evaluation somewhat limited by image quality and angles of Doppler acquisition.   Dominga Ferry MD

## 2015-01-18 ENCOUNTER — Inpatient Hospital Stay (HOSPITAL_COMMUNITY): Payer: Medicare Other

## 2015-01-18 DIAGNOSIS — E1151 Type 2 diabetes mellitus with diabetic peripheral angiopathy without gangrene: Secondary | ICD-10-CM

## 2015-01-18 LAB — CBC
HEMATOCRIT: 44.7 % (ref 39.0–52.0)
HEMOGLOBIN: 13.9 g/dL (ref 13.0–17.0)
MCH: 29.7 pg (ref 26.0–34.0)
MCHC: 31.1 g/dL (ref 30.0–36.0)
MCV: 95.5 fL (ref 78.0–100.0)
Platelets: 180 10*3/uL (ref 150–400)
RBC: 4.68 MIL/uL (ref 4.22–5.81)
RDW: 16 % — ABNORMAL HIGH (ref 11.5–15.5)
WBC: 8.5 10*3/uL (ref 4.0–10.5)

## 2015-01-18 LAB — GLUCOSE, CAPILLARY
GLUCOSE-CAPILLARY: 197 mg/dL — AB (ref 65–99)
GLUCOSE-CAPILLARY: 165 mg/dL — AB (ref 65–99)
Glucose-Capillary: 142 mg/dL — ABNORMAL HIGH (ref 65–99)
Glucose-Capillary: 160 mg/dL — ABNORMAL HIGH (ref 65–99)
Glucose-Capillary: 160 mg/dL — ABNORMAL HIGH (ref 65–99)
Glucose-Capillary: 169 mg/dL — ABNORMAL HIGH (ref 65–99)

## 2015-01-18 LAB — BASIC METABOLIC PANEL
Anion gap: 8 (ref 5–15)
BUN: 86 mg/dL — ABNORMAL HIGH (ref 6–20)
CALCIUM: 9.3 mg/dL (ref 8.9–10.3)
CHLORIDE: 99 mmol/L — AB (ref 101–111)
CO2: 40 mmol/L — AB (ref 22–32)
Creatinine, Ser: 1.75 mg/dL — ABNORMAL HIGH (ref 0.61–1.24)
GFR calc Af Amer: 41 mL/min — ABNORMAL LOW (ref >60)
GFR calc non Af Amer: 35 mL/min — ABNORMAL LOW (ref >60)
GLUCOSE: 149 mg/dL — AB (ref 65–99)
POTASSIUM: 3.5 mmol/L (ref 3.5–5.1)
Sodium: 147 mmol/L — ABNORMAL HIGH (ref 135–145)

## 2015-01-18 LAB — HEPARIN LEVEL (UNFRACTIONATED): Heparin Unfractionated: 0.53 IU/mL (ref 0.30–0.70)

## 2015-01-18 MED ORDER — INSULIN ASPART 100 UNIT/ML ~~LOC~~ SOLN
0.0000 [IU] | SUBCUTANEOUS | Status: DC
  Administered 2015-01-18: 3 [IU] via SUBCUTANEOUS
  Administered 2015-01-18 (×2): 4 [IU] via SUBCUTANEOUS
  Administered 2015-01-19: 7 [IU] via SUBCUTANEOUS
  Administered 2015-01-19 (×4): 4 [IU] via SUBCUTANEOUS
  Administered 2015-01-20: 2 [IU] via SUBCUTANEOUS
  Administered 2015-01-20 – 2015-01-21 (×8): 4 [IU] via SUBCUTANEOUS
  Administered 2015-01-21: 3 [IU] via SUBCUTANEOUS
  Administered 2015-01-21 – 2015-01-22 (×4): 4 [IU] via SUBCUTANEOUS
  Administered 2015-01-22: 7 [IU] via SUBCUTANEOUS
  Administered 2015-01-22 (×2): 4 [IU] via SUBCUTANEOUS
  Administered 2015-01-22: 2 [IU] via SUBCUTANEOUS
  Administered 2015-01-23: 7 [IU] via SUBCUTANEOUS
  Administered 2015-01-23: 4 [IU] via SUBCUTANEOUS
  Administered 2015-01-23: 3 [IU] via SUBCUTANEOUS
  Administered 2015-01-23: 4 [IU] via SUBCUTANEOUS
  Administered 2015-01-23 – 2015-01-26 (×11): 3 [IU] via SUBCUTANEOUS
  Administered 2015-01-27: 5 [IU] via SUBCUTANEOUS
  Administered 2015-01-27 (×2): 3 [IU] via SUBCUTANEOUS

## 2015-01-18 MED ORDER — FREE WATER
200.0000 mL | Freq: Three times a day (TID) | Status: DC
  Administered 2015-01-18 – 2015-01-19 (×4): 200 mL

## 2015-01-18 NOTE — Progress Notes (Signed)
ANTICOAGULATION CONSULT NOTE - Follow Up Consult  Pharmacy Consult for Heparin Indication: chest pain/ACS  Allergies  Allergen Reactions  . Statins Other (See Comments)    Severe leg myalgias, weakness    Patient Measurements: Height: 5\' 9"  (175.3 cm) Weight: 240 lb 15.4 oz (109.3 kg) IBW/kg (Calculated) : 70.7 Heparin Dosing Weight: ~95kg  Vital Signs: Temp: 99.9 F (37.7 C) (06/27 0813) Temp Source: Oral (06/27 0813) BP: 145/62 mmHg (06/27 0808) Pulse Rate: 104 (06/27 0808)  Labs:  Recent Labs  01/16/15 0500 01/16/15 0556 01/17/15 0500 01/18/15 0521 01/18/15 0522  HGB  --   --  13.4  --  13.9  HCT  --   --  43.5  --  44.7  PLT  --   --  155  --  180  HEPARINUNFRC  --  0.38 0.55 0.53  --   CREATININE 1.49*  --  1.58*  --  1.75*    Estimated Creatinine Clearance: 41.7 mL/min (by C-G formula based on Cr of 1.75).   Medications:  Heparin @ 2500 units/hr  Assessment: 81yom continues on heparin for elevated troponin, likely demand ischemia, but cardiology considering cath at some point. Heparin level is therapeutic. CBC is stable. No bleeding reported.  Goal of Therapy:  Heparin level 0.3-0.7 units/ml Monitor platelets by anticoagulation protocol: Yes   Plan:  1) Continue heparin at 2500 units/hr  2) Heparin level, CBC in AM  Vinnie Level, PharmD., BCPS Clinical Pharmacist Pager [phone]

## 2015-01-18 NOTE — Progress Notes (Signed)
eLink Physician-Brief Progress Note Patient Name: [name] DOB: 06/30/1933 MRN: [id number]   Date of Service  01/18/2015  HPI/Events of Note  Nurse questions continuing Lasix d/t CVP = 4. Na+ = 147 and Creatine = 1.75.  eICU Interventions   Will check BMP now. If either increased, hold on further diuresis until re-evaluated in the AM.     Intervention Category Intermediate Interventions: Other:  Lenell Antu 01/18/2015, 10:33 PM

## 2015-01-18 NOTE — Progress Notes (Signed)
PULMONARY / CRITICAL CARE MEDICINE   Name: [name] MRN: [id number] DOB: 04/19/1933    ADMISSION DATE:  01/09/2015 CONSULTATION DATE:  01/10/15  REFERRING MD :  Dr. Gwenlyn Perking   CHIEF COMPLAINT:  AMS, Acute Respiratory Failure   INITIAL PRESENTATION: 81 y/o M with with recent (6/17) arteriography of RLE for venous ulcers who was admitted 6/18 with a 2 week hx of SOB.  Admitted per [facility] for further evaluation.  Treated with BiPAP but failed therapy and required intubation am of 6/19.  PCCM consulted for ICU transfer.    STUDIES:  6/18  CT ABD >> post-op changes in L groin, increased density in bladder, bibasilar atx 6/18  ECHO >> LVEF 55-60%, grade II diastolic dysfunction, moderate AS (wosening since last ECHO) 6/20 EEG>>>metabolic vs drug affect, no focus  6/19  CT of Head >> Stable moderate diffuse cerebral atrophy and mild chronic small vessel white matter ischemic changes 6/20 Echo: Left ventricle: The cavity size was normal. Wall thickness was normal. Systolic function was normal. The estimated ejection fraction was in the range of 60% to 65%. Wall motion was normal;there were no regional wall motion abnormalities. 6/22: MR Brain: No infarct, age related atrophy, mild chronic small vessel disease  SIGNIFICANT EVENTS: 6/17  RLE arteriography per Dr. Edilia Bo for PVD and non-healing R toe wound 6/18  Admit to [facility] with complaints of SOB x 2 weeks 6/19  Decompensated early am, required intubation / ICU transfer 6/20-trop elevation, hep , asa 6/21: polymyoclonus with when precedex stopped 6/22: continued plt count drop to 93 down from 131,agitation required change in sedation to prop 6/23: worsening chest film, plt stable  SUBJECTIVE:  tachypneic on PSV this am, when sedation lightened Note -5L   VITAL SIGNS: Temp:  [99.3 F (37.4 C)-100.4 F (38 C)] 100.2 F (37.9 C) (06/27 1158) Pulse Rate:  [34-125] 103 (06/27 1200) Resp:  [14-34] 17 (06/27 1200) BP: (116-187)/(48-76)  132/56 mmHg (06/27 1200) SpO2:  [88 %-95 %] 93 % (06/27 1200) FiO2 (%):  [40 %-70 %] 70 % (06/27 1200) Weight:  [109.3 kg (240 lb 15.4 oz)] 109.3 kg (240 lb 15.4 oz) (06/27 0500)   HEMODYNAMICS: CVP:  [3 mmHg-10 mmHg] 3 mmHg   VENTILATOR SETTINGS: Vent Mode:  [-] PRVC FiO2 (%):  [40 %-70 %] 70 % Set Rate:  [12 bmp] 12 bmp Vt Set:  [560 mL] 560 mL PEEP:  [5 cmH20] 5 cmH20 Pressure Support:  [12 cmH20] 12 cmH20 Plateau Pressure:  [20 cmH20] 20 cmH20   INTAKE / OUTPUT:  Intake/Output Summary (Last 24 hours) at 01/18/15 1323 Last data filed at 01/18/15 1225  Gross per 24 hour  Intake 2232.91 ml  Output   3360 ml  Net -1127.09 ml    PHYSICAL EXAMINATION: General:  Obese male Neuro:  rass -1, eyes open but does not follow commands HEENT:  ETT in place Cardiovascular:  s1s2 rrr, 3/6 harsh systolic murmur Lungs: B rhonchi, no wheeze Abdomen:  Obese/soft, + BS Musculoskeletal:  No acute deformities  Skin:  Warm/dry, gen1+ BLE edema  LABS:  CBC  Recent Labs Lab 01/15/15 0404 01/17/15 0500 01/18/15 0522  WBC 6.2 7.9 8.5  HGB 12.1* 13.4 13.9  HCT 39.6 43.5 44.7  PLT 122* 155 180   Coag's No results for input(s): APTT, INR in the last 168 hours.   BMET  Recent Labs Lab 01/16/15 0500 01/17/15 0500 01/18/15 0522  NA 143 146* 147*  K 3.9 3.7 3.5  CL 98*  96* 99*  CO2 33* 39* 40*  BUN 62* 72* 86*  CREATININE 1.49* 1.58* 1.75*  GLUCOSE 136* 140* 149*   Electrolytes  Recent Labs Lab 01/14/15 1600 01/15/15 0404  01/16/15 0500 01/17/15 0500 01/18/15 0522  CALCIUM 8.5* 8.5*  < > 8.5* 9.1 9.3  MG 2.4 2.4  --   --  2.7*  --   PHOS 4.0 4.6  --   --  2.6  --   < > = values in this interval not displayed. Sepsis Markers  Recent Labs Lab 01/12/15 0411  PROCALCITON 0.25   ABG  Recent Labs Lab 01/15/15 1117  PHART 7.387  PCO2ART 64.0*  PO2ART 71.0*   Liver Enzymes  Recent Labs Lab 01/12/15 0411  AST 24  ALT 13*  ALKPHOS 62  BILITOT 0.9   ALBUMIN 2.6*   Cardiac Enzymes No results for input(s): TROPONINI, PROBNP in the last 168 hours. Glucose  Recent Labs Lab 01/17/15 1620 01/17/15 1938 01/17/15 2342 01/18/15 0339 01/18/15 0809 01/18/15 1157  GLUCAP 153* 164* 142* 160* 169* 197*    Imaging Dg Chest Port 1 View  01/18/2015   CLINICAL DATA:  Pneumonia, history of obesity, hypertensive heart disease, coronary artery disease, hypertension, type II diabetes mellitus, CHF, stage 3 chronic kidney disease  EXAM: PORTABLE CHEST - 1 VIEW  COMPARISON:  Portable exam 0518 hours compared to 01/17/2015  FINDINGS: Tip of endotracheal tube projects 6.3 cm above carina.  Nasogastric tube extends into stomach.  LEFT jugular central venous catheter with tip projecting over LEFT brachiocephalic vein.  Enlargement of cardiac silhouette with pulmonary vascular congestion.  Decreased lung volumes with atelectasis versus consolidation LEFT lower lobe.  Upper lungs clear.  No gross pleural effusion or pneumothorax.  IMPRESSION: Atelectasis versus consolidation LEFT lower lobe.  Enlargement of cardiac silhouette with pulmonary vascular congestion.   Electronically Signed   By: Ulyses Southward M.D.   On: 01/18/2015 07:26     ASSESSMENT / PLAN:  PULMONARY OETT 6/19 >>> A: Acute Respiratory Failure  Concern is pulm edema  From AS, ischemia Increased bilateral diffuse patchy opacifiction  Never Smoker  OSA  P:   -continue diuresis as able tolerate -PSV as able -BD prn -empiric abx as below -treating abdo distention  CARDIOVASCULAR CVL L IJ 6/19 >>  A:  Hypotension - cardiogenic shock- sepsis unlikely given culture data Aortic Stenosis - worsening of valvular disease, d/w cards CAD s/p PCI (remote) PVD s/p Arteriography 6/17 per Dr. Edilia Bo  NSTEMI P:  -ICU monitoring -Heparin gtt, continue until PCI ready - per cards -ASA, Plavix  -lasix with pcxr follow up  RENAL A:   Acute on Chronic Kidney Injury, has worsened some w  diuresis P:   -lasix 120 q6h,  -free water -Chem in am  GASTROINTESTINAL / GU  A:   GERD BPH - difficult to place catheter Constipation-kub 6/22 no obstruction, improved 6/26 P:   -TF to goal -ppi  HEMATOLOGIC A:  DVt prevention Thrombocytopenia (dilutional), noted low in past P:  DVT prophylaxis: heparin drip  INFECTIOUS A:   Bibasilar Airspace Disease - possible HCAP but infiltrates clearing w diuresis Recent UTI P:   BCx2 6/19 >> No blood cx collected  Sputum 6/19 >> Likely OPF UA 6/19 >>  UC 6/19 >>Pseudomonas Aeruginosa 20 K (doubt pathogen)  Ceftriaxone>>>6/22 >> 12/28/23 Vanco, start date 6/19>>>6/22  Zosyn, start date 6/19>>>6/22  Follow off abx  ENDOCRINE A:   Controlled Hyperglycemia  P:   Continue SSI Q4  blood glucose checks  NEUROLOGIC A:  Acute Encephalopathy- MR brain 6/22 negative for infarct Chronic facial twitch P:   RASS goal: -1 to 0 propofol gtt wua Fentanyl gtt for pain  PRN versed for sedation  FAMILY  - Updates: No family at bedside 6/27 am  - Inter-disciplinary family meet or Palliative Care meeting due by: 6/26 done by DF  Independent CC time 35 minutes  Levy Pupa, MD, PhD 01/18/2015, 1:41 PM [facility] [phone] or if no answer [phone]

## 2015-01-18 NOTE — Progress Notes (Signed)
Subjective:  Not able to get history as remains intubated. Events of weekend reviewed.  Diuresed, but Cr and BUn increasing now.  Objective:  Vital Signs in the last 24 hours: BP 145/62 mmHg  Pulse 104  Temp(Src) 99.9 F (37.7 C) (Oral)  Resp 20  Ht 5\' 9"  (1.753 m)  Wt 109.3 kg (240 lb 15.4 oz)  BMI 35.57 kg/m2  SpO2 91%  Physical Exam: Obese somewhat plethoric male intubated, opens eys to voice but does not follow commands Lungs:  Reduced breath sounds  Cardiac:  Regular rhythm, normal S1 and S2, no S3, harsh 3/6 holosystolic murmur Abdomen:  Soft, nontender, no masses, obese Extremities: trace+ edema noted, dry ulcer on tip of toe on right.  Intake/Output from previous day: 06/26 0701 - 06/27 0700 In: 1796.6 [I.V.:1155.6; NG/GT:345; IV Piggyback:186] Out: 3305 [Urine:3025; Emesis/NG output:30; Stool:250]  Weight Filed Weights   01/16/15 0500 01/17/15 0500 01/18/15 0500  Weight: 109.8 kg (242 lb 1 oz) 110.1 kg (242 lb 11.6 oz) 109.3 kg (240 lb 15.4 oz)    Lab Results: Basic Metabolic Panel:  Recent Labs  90/24/09 0500 01/18/15 0522  NA 146* 147*  K 3.7 3.5  CL 96* 99*  CO2 39* 40*  GLUCOSE 140* 149*  BUN 72* 86*  CREATININE 1.58* 1.75*   CBC:  Recent Labs  01/17/15 0500 01/18/15 0522  WBC 7.9 8.5  HGB 13.4 13.9  HCT 43.5 44.7  MCV 96.0 95.5  PLT 155 180   Telemetry: Sinus tachycardia  Assessment/Plan:  1.  Acute on chronic diastolic heart failure. He is negative by diuresis now but BUN/Cr are increased 2.  Aortic stenosis at least moderate and may be more severe-the aortic stenosis very well could be contributing to his repeated admissions for respiratory failure.   3.  Non-STEMI with elevation of troponin.  cath planned when stable.  Still on heparin, could convert to DVT dose if pulmonary desires 4.  Acute on chronic renal failure worsened with diuresis 5. Thrombocytopenia now stable 6. History of CAD with prior  stent  Recommendations:  Still quite tenuous and renal failure is worse. APpears more alert   [name]  MD [facility]  01/18/2015, 9:02 AM

## 2015-01-18 NOTE — Care Management (Signed)
Important Message  Patient Details  Name: Diar Felmlee MRN: [id number] Date of Birth: 05-05-1933   Medicare Important Message Given:  Yes-second notification given    Kyla Balzarine 01/18/2015, 3:54 PM

## 2015-01-18 NOTE — Progress Notes (Signed)
ETT tube holder changed without any complications.  ETT still in appropriate position with bilateral breath sounds noted.  Will continue to monitor.

## 2015-01-19 ENCOUNTER — Inpatient Hospital Stay (HOSPITAL_COMMUNITY): Payer: Medicare Other

## 2015-01-19 DIAGNOSIS — R4182 Altered mental status, unspecified: Secondary | ICD-10-CM

## 2015-01-19 LAB — BASIC METABOLIC PANEL
ANION GAP: 11 (ref 5–15)
ANION GAP: 10 (ref 5–15)
Anion gap: 10 (ref 5–15)
BUN: 102 mg/dL — AB (ref 6–20)
BUN: 108 mg/dL — ABNORMAL HIGH (ref 6–20)
BUN: 113 mg/dL — AB (ref 6–20)
CALCIUM: 9 mg/dL (ref 8.9–10.3)
CO2: 36 mmol/L — ABNORMAL HIGH (ref 22–32)
CO2: 39 mmol/L — AB (ref 22–32)
CO2: 40 mmol/L — ABNORMAL HIGH (ref 22–32)
CREATININE: 2.02 mg/dL — AB (ref 0.61–1.24)
CREATININE: 2.09 mg/dL — AB (ref 0.61–1.24)
Calcium: 8.4 mg/dL — ABNORMAL LOW (ref 8.9–10.3)
Calcium: 9.4 mg/dL (ref 8.9–10.3)
Chloride: 101 mmol/L (ref 101–111)
Chloride: 99 mmol/L — ABNORMAL LOW (ref 101–111)
Chloride: 103 mmol/L (ref 101–111)
Creatinine, Ser: 1.85 mg/dL — ABNORMAL HIGH (ref 0.61–1.24)
GFR calc Af Amer: 38 mL/min — ABNORMAL LOW (ref >60)
GFR calc Af Amer: 33 mL/min — ABNORMAL LOW (ref >60)
GFR calc non Af Amer: 33 mL/min — ABNORMAL LOW (ref >60)
GFR calc non Af Amer: 30 mL/min — ABNORMAL LOW (ref >60)
GFR, EST AFRICAN AMERICAN: 34 mL/min — AB (ref >60)
GFR, EST NON AFRICAN AMERICAN: 28 mL/min — AB (ref >60)
GLUCOSE: 167 mg/dL — AB (ref 65–99)
GLUCOSE: 182 mg/dL — AB (ref 65–99)
Glucose, Bld: 142 mg/dL — ABNORMAL HIGH (ref 65–99)
POTASSIUM: 3.1 mmol/L — AB (ref 3.5–5.1)
POTASSIUM: 3.5 mmol/L (ref 3.5–5.1)
POTASSIUM: 3.5 mmol/L (ref 3.5–5.1)
SODIUM: 148 mmol/L — AB (ref 135–145)
Sodium: 148 mmol/L — ABNORMAL HIGH (ref 135–145)
Sodium: 153 mmol/L — ABNORMAL HIGH (ref 135–145)

## 2015-01-19 LAB — GLUCOSE, CAPILLARY
GLUCOSE-CAPILLARY: 166 mg/dL — AB (ref 65–99)
GLUCOSE-CAPILLARY: 175 mg/dL — AB (ref 65–99)
Glucose-Capillary: 139 mg/dL — ABNORMAL HIGH (ref 65–99)
Glucose-Capillary: 155 mg/dL — ABNORMAL HIGH (ref 65–99)
Glucose-Capillary: 186 mg/dL — ABNORMAL HIGH (ref 65–99)
Glucose-Capillary: 199 mg/dL — ABNORMAL HIGH (ref 65–99)
Glucose-Capillary: 201 mg/dL — ABNORMAL HIGH (ref 65–99)

## 2015-01-19 LAB — HEPATIC FUNCTION PANEL
ALK PHOS: 51 U/L (ref 38–126)
ALT: 34 U/L (ref 17–63)
AST: 76 U/L — ABNORMAL HIGH (ref 15–41)
Albumin: 3.1 g/dL — ABNORMAL LOW (ref 3.5–5.0)
BILIRUBIN TOTAL: 0.7 mg/dL (ref 0.3–1.2)
Bilirubin, Direct: 0.2 mg/dL (ref 0.1–0.5)
Indirect Bilirubin: 0.5 mg/dL (ref 0.3–0.9)
TOTAL PROTEIN: 8.9 g/dL — AB (ref 6.5–8.1)

## 2015-01-19 LAB — CK TOTAL AND CKMB (NOT AT ARMC)
CK, MB: 2.6 ng/mL (ref 0.5–5.0)
RELATIVE INDEX: 0.1 (ref 0.0–2.5)
Total CK: 2732 U/L — ABNORMAL HIGH (ref 49–397)

## 2015-01-19 LAB — CBC
HCT: 51 % (ref 39.0–52.0)
HEMOGLOBIN: 16.1 g/dL (ref 13.0–17.0)
MCH: 29.9 pg (ref 26.0–34.0)
MCHC: 31.6 g/dL (ref 30.0–36.0)
MCV: 94.8 fL (ref 78.0–100.0)
Platelets: 207 10*3/uL (ref 150–400)
RBC: 5.38 MIL/uL (ref 4.22–5.81)
RDW: 16.5 % — ABNORMAL HIGH (ref 11.5–15.5)
WBC: 10.7 10*3/uL — ABNORMAL HIGH (ref 4.0–10.5)

## 2015-01-19 LAB — HEPARIN LEVEL (UNFRACTIONATED): HEPARIN UNFRACTIONATED: 0.83 [IU]/mL — AB (ref 0.30–0.70)

## 2015-01-19 LAB — TROPONIN I
Troponin I: 0.41 ng/mL — ABNORMAL HIGH (ref <0.031)
Troponin I: 0.49 ng/mL — ABNORMAL HIGH (ref <0.031)

## 2015-01-19 LAB — LACTIC ACID, PLASMA: Lactic Acid, Venous: 1.7 mmol/L (ref 0.5–2.0)

## 2015-01-19 MED ORDER — FREE WATER
200.0000 mL | Status: DC
  Administered 2015-01-20 (×2): 200 mL

## 2015-01-19 MED ORDER — ACETAMINOPHEN 325 MG PO TABS
325.0000 mg | ORAL_TABLET | Freq: Four times a day (QID) | ORAL | Status: DC | PRN
  Administered 2015-01-19: 650 mg
  Filled 2015-01-19: qty 2

## 2015-01-19 MED ORDER — METOCLOPRAMIDE HCL 5 MG/ML IJ SOLN
5.0000 mg | Freq: Once | INTRAMUSCULAR | Status: AC
  Administered 2015-01-19: 5 mg via INTRAVENOUS
  Filled 2015-01-19: qty 1

## 2015-01-19 MED ORDER — CLOPIDOGREL BISULFATE 75 MG PO TABS
75.0000 mg | ORAL_TABLET | Freq: Every day | ORAL | Status: DC
  Administered 2015-01-20 – 2015-01-28 (×7): 75 mg
  Filled 2015-01-19 (×11): qty 1

## 2015-01-19 MED ORDER — METOPROLOL TARTRATE 1 MG/ML IV SOLN: 5.0000 mg | Freq: Four times a day (QID) | INTRAVENOUS | Status: DC | PRN

## 2015-01-19 MED ORDER — METOPROLOL TARTRATE 12.5 MG HALF TABLET
12.5000 mg | ORAL_TABLET | Freq: Two times a day (BID) | ORAL | Status: DC
  Administered 2015-01-19 – 2015-01-27 (×12): 12.5 mg via ORAL
  Filled 2015-01-19 (×21): qty 1

## 2015-01-19 MED ORDER — MAGNESIUM OXIDE 400 (241.3 MG) MG PO TABS
400.0000 mg | ORAL_TABLET | ORAL | Status: DC
  Administered 2015-01-20 – 2015-01-22 (×3): 400 mg
  Filled 2015-01-19 (×3): qty 1

## 2015-01-19 MED ORDER — FUROSEMIDE 10 MG/ML IJ SOLN
40.0000 mg | Freq: Four times a day (QID) | INTRAMUSCULAR | Status: DC
  Administered 2015-01-19 (×2): 40 mg via INTRAVENOUS
  Filled 2015-01-19 (×2): qty 4

## 2015-01-19 MED ORDER — DEXMEDETOMIDINE HCL IN NACL 200 MCG/50ML IV SOLN
0.0000 ug/kg/h | INTRAVENOUS | Status: AC
  Administered 2015-01-19: 0.4 ug/kg/h via INTRAVENOUS
  Administered 2015-01-19: 0.6 ug/kg/h via INTRAVENOUS
  Administered 2015-01-20: 0.04 ug/kg/h via INTRAVENOUS
  Administered 2015-01-20: 0.4 ug/kg/h via INTRAVENOUS
  Administered 2015-01-20: 0.06 ug/kg/h via INTRAVENOUS
  Administered 2015-01-20: 0.4 ug/kg/h via INTRAVENOUS
  Administered 2015-01-21: 0.06 ug/kg/h via INTRAVENOUS
  Filled 2015-01-19 (×4): qty 50
  Filled 2015-01-19: qty 100
  Filled 2015-01-19 (×2): qty 50

## 2015-01-19 MED ORDER — HEPARIN SODIUM (PORCINE) 5000 UNIT/ML IJ SOLN
5000.0000 [IU] | Freq: Three times a day (TID) | INTRAMUSCULAR | Status: DC
  Administered 2015-01-19 – 2015-01-28 (×27): 5000 [IU] via SUBCUTANEOUS
  Filled 2015-01-19 (×32): qty 1

## 2015-01-19 NOTE — Progress Notes (Signed)
eLink Physician-Brief Progress Note Patient Name: [name] DOB: 1933/05/27 MRN: [id number]   Date of Service  01/19/2015  HPI/Events of Note  R N call - patient trying to self extubate - > PLAN: start prcedex  eICU Interventions  Lab review folliwbng vomitAXR ok but NA 153 and rising, BUN 111 and rising, creat 2.09mg  % and rising  Also mild rhabdo with CK 2700  plan  Add free water DC lasix monitir     Intervention Category Major Interventions: Other:  Dontavis Tschantz 01/19/2015, 8:41 PM   Dg Chest [facility]  01/19/2015   CLINICAL DATA:  Acute respiratory failure.  EXAM: PORTABLE CHEST - 1 VIEW  COMPARISON:  01/18/2015  FINDINGS: The endotracheal tube is not well seen but probably unchanged in position with tip around the level of the clavicular heads. Nasogastric tube extends into the stomach. Left jugular central line extends into the SVC. Minor airspace opacities persist bilaterally without significant interval change.  IMPRESSION: Support equipment appears satisfactorily positioned.  No significant interval change in the bilateral airspace opacities.   Electronically Signed   By: Ellery Plunk M.D.   On: 01/19/2015 04:39   Dg Chest Port 1 View  01/18/2015   CLINICAL DATA:  Pulmonary infiltrates  EXAM: PORTABLE CHEST - 1 VIEW  COMPARISON:  01/18/2015 at 0518 hours  FINDINGS: Low lung volumes.  Patchy left lower lobe/ perihilar opacity, atelectasis versus pneumonia.  Possible mild perihilar edema versus vascular crowding. No pleural effusion or pneumothorax.  Endotracheal tube terminates 5.5 cm above the carina.  The heart is normal in size.  Enteric tube courses into the stomach.  IMPRESSION: Patchy left lower lobe/ perihilar opacity, atelectasis versus pneumonia.  Low lung volumes with mild perihilar edema versus vascular crowding.  Endotracheal tube terminates 5.5 cm above the carina.   Electronically Signed   By: Charline Bills M.D.   On: 01/18/2015 14:27   Dg  Chest Port 1 View  01/18/2015   CLINICAL DATA:  Pneumonia, history of obesity, hypertensive heart disease, coronary artery disease, hypertension, type II diabetes mellitus, CHF, stage 3 chronic kidney disease  EXAM: PORTABLE CHEST - 1 VIEW  COMPARISON:  Portable exam 0518 hours compared to 01/17/2015  FINDINGS: Tip of endotracheal tube projects 6.3 cm above carina.  Nasogastric tube extends into stomach.  LEFT jugular central venous catheter with tip projecting over LEFT brachiocephalic vein.  Enlargement of cardiac silhouette with pulmonary vascular congestion.  Decreased lung volumes with atelectasis versus consolidation LEFT lower lobe.  Upper lungs clear.  No gross pleural effusion or pneumothorax.  IMPRESSION: Atelectasis versus consolidation LEFT lower lobe.  Enlargement of cardiac silhouette with pulmonary vascular congestion.   Electronically Signed   By: Ulyses Southward M.D.   On: 01/18/2015 07:26   Dg Abd Portable 1v  01/19/2015   CLINICAL DATA:  Vomiting and abdominal pain for 1 day.  EXAM: PORTABLE ABDOMEN - 1 VIEW  COMPARISON:  01/13/2015, CT 01/09/2015  FINDINGS: No dilated bowel loops to suggest obstruction. No evidence of free air and mass single portable view. Projecting over the mid lower pelvis is a linear/curvilinear density. No radiopaque calculi. Degenerative change throughout spine.  IMPRESSION: 1. No evidence of bowel obstruction or free air on this single view. 2. Linear/curvilinear density projecting over the central pelvis, this is not seen on prior exams. This may be external to the patient. This is otherwise nonspecific and of uncertain etiology.   Electronically Signed   By: Lujean Rave.D.  On: 01/19/2015 18:36

## 2015-01-19 NOTE — Progress Notes (Signed)
Subjective:  Not able to get history as remains intubated.BUN and creatinine have climbed further with diuresis. Currently tachycardic.  Objective:  Vital Signs in the last 24 hours: BP 170/80 mmHg  Pulse 121  Temp(Src) 102.7 F (39.3 C) (Oral)  Resp 22  Ht 5\' 9"  (1.753 m)  Wt 108 kg (238 lb 1.6 oz)  BMI 35.14 kg/m2  SpO2 92%  Physical Exam: Obese somewhat plethoric male intubated, opens eys to voice but does not follow commands Lungs:  Reduced breath sounds  Cardiac:  Regular rhythm, normal S1 and S2, no S3, harsh 3/6 holosystolic murmur Abdomen:  Soft, nontender, no masses, obese Extremities: trace+ edema noted, dry ulcer on tip of toe on right.  Intake/Output from previous day: 06/27 0701 - 06/28 0700 In: 2796.7 [I.V.:1418.7; NG/GT:1020; IV Piggyback:248] Out: 3365 [Urine:3365]  Weight Filed Weights   01/17/15 0500 01/18/15 0500 01/19/15 0500  Weight: 110.1 kg (242 lb 11.6 oz) 109.3 kg (240 lb 15.4 oz) 108 kg (238 lb 1.6 oz)    Lab Results: Basic Metabolic Panel:  Recent Labs  49/17/91 2300 01/19/15 0400  NA 148* 148*  K 3.1* 3.5  CL 101 99*  CO2 36* 39*  GLUCOSE 142* 167*  BUN 102* 108*  CREATININE 1.85* 2.02*   CBC:  Recent Labs  01/17/15 0500 01/18/15 0522  WBC 7.9 8.5  HGB 13.4 13.9  HCT 43.5 44.7  MCV 96.0 95.5  PLT 155 180   Telemetry: Sinus tachycardia  Assessment/Plan:  1.  Acute on chronic diastolic heart failure. He is negative by diuresis now but BUN/Cr are increased and I think he is overdiuresed and diuretics now on hold 2.  Aortic stenosis at least moderate and may be more severe-the aortic stenosis very well could be contributing to his repeated admissions for respiratory failure. 3. Sinus tachycardia now    3.  Non-STEMI with elevation of troponin.  cath planned when stable.  Still on heparin, could convert to DVT dose if pulmonary desires 4.  Acute on chronic renal failure worsened with diuresis 5. Thrombocytopenia now  stable 6. History of CAD with prior stent  Recommendations:  Still quite tenuous and renal failure is worse. Tachycardic now with lightening of sedation.  Difficult problem. WOuld not diurese further.   Darden Palmer  MD [facility]  01/19/2015, 1:49 PM

## 2015-01-19 NOTE — Progress Notes (Signed)
75cc of fentanyl drip wasted in sink. Witness by [employer] RN

## 2015-01-19 NOTE — Progress Notes (Signed)
Dr. Delton Coombes notified of tachycardia HR 125 and hypertension BP 160/70. See new orders. Nursing to continue to monitor.

## 2015-01-19 NOTE — Progress Notes (Signed)
ANTICOAGULATION CONSULT NOTE  Pharmacy Consult for Heparin Indication: chest pain/ACS  Allergies  Allergen Reactions  . Statins Other (See Comments)    Severe leg myalgias, weakness    Patient Measurements: Height: 5\' 9"  (175.3 cm) Weight: 240 lb 15.4 oz (109.3 kg) IBW/kg (Calculated) : 70.7 Heparin Dosing Weight: ~95kg  Vital Signs: Temp: 102.1 F (38.9 C) (06/28 0351) Temp Source: Oral (06/28 0351) BP: 118/56 mmHg (06/28 0356) Pulse Rate: 103 (06/28 0356)  Labs:  Recent Labs  01/17/15 0500 01/18/15 0521 01/18/15 0522 01/18/15 2300 01/19/15 0400  HGB 13.4  --  13.9  --   --   HCT 43.5  --  44.7  --   --   PLT 155  --  180  --   --   HEPARINUNFRC 0.55 0.53  --   --  0.83*  CREATININE 1.58*  --  1.75* 1.85* 2.02*    Estimated Creatinine Clearance: 36.1 mL/min (by C-G formula based on Cr of 2.02).  Assessment: 81 yo male with NSTEMI, awaiting possible cath, for heparin  Goal of Therapy:  Heparin level 0.3-0.7 units/ml Monitor platelets by anticoagulation protocol: Yes   Plan:  Decrease Heparin 2250 units/hr  Geannie Risen, PharmD, BCPS

## 2015-01-19 NOTE — Progress Notes (Signed)
PULMONARY / CRITICAL CARE MEDICINE   Name: [name] MRN: [id number] DOB: 11-18-1932    ADMISSION DATE:  01/09/2015 CONSULTATION DATE:  01/10/15  REFERRING MD :  Dr. Gwenlyn Perking   CHIEF COMPLAINT:  AMS, Acute Respiratory Failure   INITIAL PRESENTATION: 81 y/o M with with recent (6/17) arteriography of RLE for venous ulcers who was admitted 6/18 with a 2 week hx of SOB.  Admitted per [facility] for further evaluation.  Treated with BiPAP but failed therapy and required intubation am of 6/19.  PCCM consulted for ICU transfer.    STUDIES:  6/18  CT ABD >> post-op changes in L groin, increased density in bladder, bibasilar atx 6/18  ECHO >> LVEF 55-60%, grade II diastolic dysfunction, moderate AS (wosening since last ECHO) 6/20 EEG>>>metabolic vs drug affect, no focus  6/19  CT of Head >> Stable moderate diffuse cerebral atrophy and mild chronic small vessel white matter ischemic changes 6/20 Echo: Left ventricle: The cavity size was normal. Wall thickness was normal. Systolic function was normal. The estimated ejection fraction was in the range of 60% to 65%. Wall motion was normal;there were no regional wall motion abnormalities. 6/22: MR Brain: No infarct, age related atrophy, mild chronic small vessel disease  SIGNIFICANT EVENTS: 6/17  RLE arteriography per Dr. Edilia Bo for PVD and non-healing R toe wound 6/18  Admit to [facility] with complaints of SOB x 2 weeks 6/19  Decompensated early am, required intubation / ICU transfer 6/20-trop elevation, hep , asa 6/21: polymyoclonus with when precedex stopped 6/22: continued plt count drop to 93 down from 131,agitation required change in sedation to prop 6/23: worsening chest film, plt stable  SUBJECTIVE:  Sedation off since 8:00 Febrile this am  VITAL SIGNS: Temp:  [100.2 F (37.9 C)-102.7 F (39.3 C)] 102.7 F (39.3 C) (06/28 0815) Pulse Rate:  [39-125] 47 (06/28 0800) Resp:  [12-34] 25 (06/28 0800) BP: (116-187)/(36-76) 175/65 mmHg (06/28  0800) SpO2:  [88 %-97 %] 95 % (06/28 0800) FiO2 (%):  [40 %-70 %] 40 % (06/28 0800) Weight:  [108 kg (238 lb 1.6 oz)] 108 kg (238 lb 1.6 oz) (06/28 0500)   HEMODYNAMICS: CVP:  [0 mmHg-13 mmHg] 6 mmHg   VENTILATOR SETTINGS: Vent Mode:  [-] PSV;CPAP FiO2 (%):  [40 %-70 %] 40 % Set Rate:  [12 bmp] 12 bmp Vt Set:  [560 mL] 560 mL PEEP:  [5 cmH20] 5 cmH20 Pressure Support:  [5 cmH20] 5 cmH20 Plateau Pressure:  [19 cmH20-20 cmH20] 19 cmH20   INTAKE / OUTPUT:  Intake/Output Summary (Last 24 hours) at 01/19/15 0902 Last data filed at 01/19/15 0800  Gross per 24 hour  Intake 2691.03 ml  Output   3100 ml  Net -408.97 ml    PHYSICAL EXAMINATION: General:  Obese male Neuro:  rass -1, eyes open but does not follow commands HEENT:  ETT in place Cardiovascular:  s1s2 rrr, 3/6 harsh systolic murmur Lungs: B rhonchi, no wheeze Abdomen:  Obese/soft, + BS Musculoskeletal:  No acute deformities  Skin:  Warm/dry, gen1+ BLE edema  LABS:  CBC  Recent Labs Lab 01/15/15 0404 01/17/15 0500 01/18/15 0522  WBC 6.2 7.9 8.5  HGB 12.1* 13.4 13.9  HCT 39.6 43.5 44.7  PLT 122* 155 180   Coag's No results for input(s): APTT, INR in the last 168 hours.   BMET  Recent Labs Lab 01/18/15 0522 01/18/15 2300 01/19/15 0400  NA 147* 148* 148*  K 3.5 3.1* 3.5  CL 99* 101 99*  CO2 40* 36* 39*  BUN 86* 102* 108*  CREATININE 1.75* 1.85* 2.02*  GLUCOSE 149* 142* 167*   Electrolytes  Recent Labs Lab 01/14/15 1600 01/15/15 0404  01/17/15 0500 01/18/15 0522 01/18/15 2300 01/19/15 0400  CALCIUM 8.5* 8.5*  < > 9.1 9.3 8.4* 9.0  MG 2.4 2.4  --  2.7*  --   --   --   PHOS 4.0 4.6  --  2.6  --   --   --   < > = values in this interval not displayed. Sepsis Markers No results for input(s): LATICACIDVEN, PROCALCITON, O2SATVEN in the last 168 hours. ABG  Recent Labs Lab 01/15/15 1117  PHART 7.387  PCO2ART 64.0*  PO2ART 71.0*   Liver Enzymes No results for input(s): AST, ALT,  ALKPHOS, BILITOT, ALBUMIN in the last 168 hours. Cardiac Enzymes No results for input(s): TROPONINI, PROBNP in the last 168 hours. Glucose  Recent Labs Lab 01/18/15 1157 01/18/15 1611 01/18/15 2010 01/18/15 2335 01/19/15 0347 01/19/15 0813  GLUCAP 197* 160* 165* 139* 155* 201*    Imaging Dg Chest Port 1 View  01/19/2015   CLINICAL DATA:  Acute respiratory failure.  EXAM: PORTABLE CHEST - 1 VIEW  COMPARISON:  01/18/2015  FINDINGS: The endotracheal tube is not well seen but probably unchanged in position with tip around the level of the clavicular heads. Nasogastric tube extends into the stomach. Left jugular central line extends into the SVC. Minor airspace opacities persist bilaterally without significant interval change.  IMPRESSION: Support equipment appears satisfactorily positioned.  No significant interval change in the bilateral airspace opacities.   Electronically Signed   By: Ellery Plunk M.D.   On: 01/19/2015 04:39   Dg Chest Port 1 View  01/18/2015   CLINICAL DATA:  Pulmonary infiltrates  EXAM: PORTABLE CHEST - 1 VIEW  COMPARISON:  01/18/2015 at 0518 hours  FINDINGS: Low lung volumes.  Patchy left lower lobe/ perihilar opacity, atelectasis versus pneumonia.  Possible mild perihilar edema versus vascular crowding. No pleural effusion or pneumothorax.  Endotracheal tube terminates 5.5 cm above the carina.  The heart is normal in size.  Enteric tube courses into the stomach.  IMPRESSION: Patchy left lower lobe/ perihilar opacity, atelectasis versus pneumonia.  Low lung volumes with mild perihilar edema versus vascular crowding.  Endotracheal tube terminates 5.5 cm above the carina.   Electronically Signed   By: Charline Bills M.D.   On: 01/18/2015 14:27     ASSESSMENT / PLAN:  PULMONARY OETT 6/19 >>> A: Acute Respiratory Failure  Likely pulm edema  From AS, ischemia Increased bilateral diffuse patchy opacifiction  Never Smoker  OSA  P:   -have pushed diuresis, will  hold lasix 6/28 and follow -PSV as able -BD prn -empiric abx as below -treating abdomenal distention  CARDIOVASCULAR CVL L IJ 6/19 >>  A:  Hypotension - cardiogenic shock- sepsis unlikely given culture data Aortic Stenosis - worsening of valvular disease, d/w cards CAD s/p PCI (remote) PVD s/p Arteriography 6/17 per Dr. Edilia Bo  NSTEMI P:  -ICU monitoring -Heparin gtt, continue until PCI ready - per cards -ASA, Plavix  -decrease lasix 6/28, -6100cc for the hosp   RENAL A:   Acute on Chronic Kidney Injury, has worsened some w diuresis P:   -lasix 120 q6h, will decrease to 40mg  on 6/28 and follow I/O -free water -follow BMP  GASTROINTESTINAL / GU  A:   GERD BPH - difficult to place catheter Constipation-kub 6/22 no obstruction, improved 6/26 P:   -  TF to goal -ppi  HEMATOLOGIC A:  DVt prevention Thrombocytopenia (dilutional), noted low in past P:  DVT prophylaxis: heparin drip  INFECTIOUS A:   Bibasilar Airspace Disease - possible HCAP but infiltrates clearing w diuresis Recent UTI P:   BCx2 6/19 >> No blood cx collected  Sputum 6/19 >> Likely OPF UC 6/19 >>Pseudomonas Aeruginosa 20 K (doubt pathogen) resp 6/28 >>  Blood 6/28 >>  Urine 6/28 >>  Ceftriaxone>>>6/22 >> 12/28/23 Vanco, start date 6/19>>>6/22  Zosyn, start date 6/19>>>6/22  Note fever 6/28, will re-send cx's   ENDOCRINE A:   Controlled Hyperglycemia  P:   Continue SSI Q4 blood glucose checks  NEUROLOGIC A:  Acute Encephalopathy- MR brain 6/22 negative for infarct Chronic facial twitch P:   RASS goal: -1 to 0 propofol gtt wua Fentanyl gtt for pain  PRN versed for sedation  FAMILY  - Updates:  Spoke with the patient's daughter at bedside 6/28  - Inter-disciplinary family meet or Palliative Care meeting due by: 6/26 done by DF  Independent CC time 35 minutes  Levy Pupa, MD, PhD 01/19/2015, 9:02 AM [facility] [phone] or if no answer [phone]

## 2015-01-19 NOTE — Progress Notes (Signed)
eLink Physician-Brief Progress Note Patient Name: [name] DOB: January 04, 1933 MRN: [id number]   Date of Service  01/19/2015  HPI/Events of Note  Vomit everytime RN tries to give meds etc., PAtient has been NPO. Good stool output  eICU Interventions  reglan 5mg  IV x 1 Stat bmet, lft, lactate, cbc, trop, kub     Intervention Category Intermediate Interventions: Other:  [name] 01/19/2015, 6:07 PM

## 2015-01-20 ENCOUNTER — Inpatient Hospital Stay (HOSPITAL_COMMUNITY): Payer: Medicare Other

## 2015-01-20 DIAGNOSIS — L8990 Pressure ulcer of unspecified site, unspecified stage: Secondary | ICD-10-CM | POA: Insufficient documentation

## 2015-01-20 LAB — CBC
HCT: 52.8 % — ABNORMAL HIGH (ref 39.0–52.0)
HEMOGLOBIN: 16.3 g/dL (ref 13.0–17.0)
MCH: 29.5 pg (ref 26.0–34.0)
MCHC: 30.9 g/dL (ref 30.0–36.0)
MCV: 95.5 fL (ref 78.0–100.0)
PLATELETS: 227 10*3/uL (ref 150–400)
RBC: 5.53 MIL/uL (ref 4.22–5.81)
RDW: 16.5 % — ABNORMAL HIGH (ref 11.5–15.5)
WBC: 10.9 10*3/uL — ABNORMAL HIGH (ref 4.0–10.5)

## 2015-01-20 LAB — BASIC METABOLIC PANEL
Anion gap: 15 (ref 5–15)
BUN: 114 mg/dL — AB (ref 6–20)
CO2: 38 mmol/L — AB (ref 22–32)
Calcium: 9.7 mg/dL (ref 8.9–10.3)
Chloride: 101 mmol/L (ref 101–111)
Creatinine, Ser: 2.22 mg/dL — ABNORMAL HIGH (ref 0.61–1.24)
GFR calc Af Amer: 31 mL/min — ABNORMAL LOW (ref >60)
GFR, EST NON AFRICAN AMERICAN: 26 mL/min — AB (ref >60)
GLUCOSE: 177 mg/dL — AB (ref 65–99)
POTASSIUM: 3.4 mmol/L — AB (ref 3.5–5.1)
Sodium: 154 mmol/L — ABNORMAL HIGH (ref 135–145)

## 2015-01-20 LAB — GLUCOSE, CAPILLARY
GLUCOSE-CAPILLARY: 125 mg/dL — AB (ref 65–99)
GLUCOSE-CAPILLARY: 172 mg/dL — AB (ref 65–99)
Glucose-Capillary: 163 mg/dL — ABNORMAL HIGH (ref 65–99)
Glucose-Capillary: 160 mg/dL — ABNORMAL HIGH (ref 65–99)
Glucose-Capillary: 179 mg/dL — ABNORMAL HIGH (ref 65–99)

## 2015-01-20 LAB — MAGNESIUM: MAGNESIUM: 3.4 mg/dL — AB (ref 1.7–2.4)

## 2015-01-20 LAB — URINE CULTURE: Culture: NO GROWTH

## 2015-01-20 LAB — CK TOTAL AND CKMB (NOT AT ARMC)
CK TOTAL: 2495 U/L — AB (ref 49–397)
CK, MB: 1.5 ng/mL (ref 0.5–5.0)
Relative Index: 0.1 (ref 0.0–2.5)

## 2015-01-20 LAB — TROPONIN I
TROPONIN I: 0.42 ng/mL — AB (ref <0.031)
Troponin I: 0.54 ng/mL (ref <0.031)

## 2015-01-20 LAB — PHOSPHORUS: Phosphorus: 2.7 mg/dL (ref 2.5–4.6)

## 2015-01-20 MED ORDER — VANCOMYCIN HCL 10 G IV SOLR
1500.0000 mg | INTRAVENOUS | Status: DC
  Administered 2015-01-22 – 2015-01-24 (×2): 1500 mg via INTRAVENOUS
  Filled 2015-01-20 (×2): qty 1500

## 2015-01-20 MED ORDER — SODIUM CHLORIDE 0.9 % IV SOLN: INTRAVENOUS | Status: DC

## 2015-01-20 MED ORDER — ACETAMINOPHEN 160 MG/5ML PO SOLN
650.0000 mg | Freq: Four times a day (QID) | ORAL | Status: DC | PRN
  Administered 2015-01-20 – 2015-01-22 (×7): 650 mg
  Filled 2015-01-20 (×7): qty 20.3

## 2015-01-20 MED ORDER — VANCOMYCIN HCL 10 G IV SOLR
1500.0000 mg | Freq: Once | INTRAVENOUS | Status: AC
  Administered 2015-01-20: 1500 mg via INTRAVENOUS
  Filled 2015-01-20: qty 1500

## 2015-01-20 MED ORDER — PIPERACILLIN-TAZOBACTAM 3.375 G IVPB
3.3750 g | Freq: Three times a day (TID) | INTRAVENOUS | Status: DC
  Administered 2015-01-20 – 2015-01-22 (×7): 3.375 g via INTRAVENOUS
  Filled 2015-01-20 (×9): qty 50

## 2015-01-20 MED ORDER — FREE WATER
300.0000 mL | Status: DC
  Administered 2015-01-20 – 2015-01-22 (×11): 300 mL

## 2015-01-20 MED ORDER — SODIUM CHLORIDE 0.9 % IV SOLN: 1250.0000 mg | INTRAVENOUS | Status: DC

## 2015-01-20 NOTE — Plan of Care (Signed)
Problem: Phase I Progression Outcomes Goal: Patient tolerating nututrition at goal Outcome: Not Progressing Vital high protein restarted at 15 mls for trickle feed per Dr Neville Route orders

## 2015-01-20 NOTE — Progress Notes (Signed)
eLink Physician-Brief Progress Note Patient Name: Tyquon Disotell DOB: [birth year] MRN: [id number]   Date of Service  01/20/2015  HPI/Events of Note  Temp to 103 F rectally.   eICU Interventions  Will order Tylenol Liquid via OGT.     Intervention Category Intermediate Interventions: Infection - evaluation and management  Sommer,Steven Eugene 01/20/2015, 5:24 AM

## 2015-01-20 NOTE — Progress Notes (Addendum)
ANTIBIOTIC CONSULT NOTE - INITIAL  Pharmacy Consult for Vancomycin Indication: bacteremia  Allergies  Allergen Reactions  . Statins Other (See Comments)    Severe leg myalgias, weakness    Patient Measurements: Height:  (175.3 cm) Weight: 229 lb 15 oz (104.3 kg) IBW/kg (Calculated) : 70.7  Vital Signs: Temp: 103 F (39.4 C) (06/29 0356) Temp Source: Oral (06/29 0356) BP: 136/76 mmHg (06/29 0400) Pulse Rate: 68 (06/29 0400) Intake/Output from previous day: 06/28 0701 - 06/29 0700 In: 440.1 [I.V.:162.6; NG/GT:127.5] Out: 3535 [Urine:3135; Stool:400] Intake/Output from this shift: Total I/O In: 49.6 [I.V.:19.6; Other:30] Out: 1000 [Urine:1000]  Labs:  Recent Labs  01/18/15 0522 01/18/15 2300 01/19/15 0400 01/19/15 1830 01/20/15 0512  WBC 8.5  --   --  10.7* 10.9*  HGB 13.9  --   --  16.1 16.3  PLT 180  --   --  207 227  CREATININE 1.75* 1.85* 2.02* 2.09*  --    Estimated Creatinine Clearance: 34.1 mL/min (by C-G formula based on Cr of 2.09). No results for input(s): VANCOTROUGH, VANCOPEAK, VANCORANDOM, GENTTROUGH, GENTPEAK, GENTRANDOM, TOBRATROUGH, TOBRAPEAK, TOBRARND, AMIKACINPEAK, AMIKACINTROU, AMIKACIN in the last 72 hours.   Microbiology: Recent Results (from the past 720 hour(s))  MRSA PCR Screening     Status: None   Collection Time: 01/10/15  2:31 AM  Result Value Ref Range Status   MRSA by PCR NEGATIVE NEGATIVE Final    Comment:        The GeneXpert MRSA Assay (FDA approved for NASAL specimens only), is one component of a comprehensive MRSA colonization surveillance program. It is not intended to diagnose MRSA infection nor to guide or monitor treatment for MRSA infections.   MRSA PCR Screening     Status: None   Collection Time: 01/10/15  8:01 AM  Result Value Ref Range Status   MRSA by PCR NEGATIVE NEGATIVE Final    Comment:        The GeneXpert MRSA Assay (FDA approved for NASAL specimens only), is one component of a comprehensive  MRSA colonization surveillance program. It is not intended to diagnose MRSA infection nor to guide or monitor treatment for MRSA infections.   Culture, respiratory (NON-Expectorated)     Status: None   Collection Time: 01/10/15  9:29 AM  Result Value Ref Range Status   Specimen Description TRACHEAL ASPIRATE  Final   Special Requests NONE  Final   Gram Stain   Final    RARE WBC PRESENT, PREDOMINANTLY PMN RARE SQUAMOUS EPITHELIAL CELLS PRESENT FEW GRAM POSITIVE COCCI IN PAIRS FEW GRAM POSITIVE RODS Performed at [facility]    Culture   Final    Non-Pathogenic Oropharyngeal-type Flora Isolated. Performed at [facility]    Report Status 01/12/2015 FINAL  Final  Culture, Urine     Status: None   Collection Time: 01/10/15 10:21 AM  Result Value Ref Range Status   Specimen Description URINE, CATHETERIZED  Final   Special Requests NONE  Final   Culture 20,000 COLONIES/mL PSEUDOMONAS AERUGINOSA  Final   Report Status 01/12/2015 FINAL  Final   Organism ID, Bacteria PSEUDOMONAS AERUGINOSA  Final      Susceptibility   Pseudomonas aeruginosa - MIC*    CEFTAZIDIME <=1 SENSITIVE Sensitive     CIPROFLOXACIN <=0.25 SENSITIVE Sensitive     GENTAMICIN <=1 SENSITIVE Sensitive     IMIPENEM 2 SENSITIVE Sensitive     PIP/TAZO <=4 SENSITIVE Sensitive     CEFEPIME <=1 SENSITIVE Sensitive     *  20,000 COLONIES/mL PSEUDOMONAS AERUGINOSA     Medications:  Scheduled:  . antiseptic oral rinse  7 mL Mouth Rinse QID  . aspirin  81 mg Per Tube Daily  . chlorhexidine  15 mL Mouth Rinse BID  . clopidogrel  75 mg Per Tube Daily  . feeding supplement (PRO-STAT SUGAR FREE 64)  60 mL Per Tube QID  . feeding supplement (VITAL HIGH PROTEIN)  1,000 mL Per Tube Q24H  . free water  200 mL Per Tube Q4H  . heparin subcutaneous  5,000 Units Subcutaneous 3 times per day  . insulin aspart  0-20 Units Subcutaneous 6 times per day  . magnesium oxide  400 mg Per Tube Q24H  . metoprolol tartrate   12.5 mg Oral BID  . pantoprazole sodium  40 mg Per Tube Daily   Assessment: 81 y.o. male with 2/2 blood cultures growing GPC for empiric antibiotics  Goal of Therapy:  Vancomycin trough level 15-20 mcg/ml  Plan:  Vancomycin 1500 mg IV now, then 1250 mg IV q48h  Abbott, Gary Fleet 01/20/2015,5:48 AM  Addendum: Given bacteremia, would prefer a more aggressive dose. Increase Vancomycin to 1500 mg IV Q 48 hours. Pharmacy is also consulted to start Zosyn for empiric sepsis coverage. Will order Zosyn 3.375 gm IV Q 8 hours. Monitor renal fx closely.   Vinnie Level, PharmD., BCPS Clinical Pharmacist Pager [phone]

## 2015-01-20 NOTE — Progress Notes (Signed)
Subjective:  Not able to get history as remains intubated Significant events since yesterday febrile to 102.7.  Objective:  Vital Signs in the last 24 hours: BP 131/58 mmHg  Pulse 99  Temp(Src) 102.7 F (39.3 C) (Oral)  Resp 17  Ht 5\' 9"  (1.753 m)  Wt 104.3 kg (229 lb 15 oz)  BMI 33.94 kg/m2  SpO2 94%  Physical Exam: Obese somewhat plethoric male intubated, opens eys to voice but does not follow commands Lungs:  Reduced breath sounds  Cardiac:  Regular rhythm, normal S1 and S2, no S3, harsh 3/6 holosystolic murmur Abdomen:  Soft, nontender, no masses, obese Extremities: trace+ edema noted, dry ulcer on tip of toe on right.  Intake/Output from previous day: 06/28 0701 - 06/29 0700 In: 620.6 [I.V.:263.1; NG/GT:127.5] Out: 3535 [Urine:3135; Stool:400]  Weight Filed Weights   01/18/15 0500 01/19/15 0500 01/20/15 0452  Weight: 109.3 kg (240 lb 15.4 oz) 108 kg (238 lb 1.6 oz) 104.3 kg (229 lb 15 oz)    Lab Results: Basic Metabolic Panel:  Recent Labs  71/24/58 1830 01/20/15 0512  NA 153* 154*  K 3.5 3.4*  CL 103 101  CO2 40* 38*  GLUCOSE 182* 177*  BUN 113* 114*  CREATININE 2.09* 2.22*   CBC:  Recent Labs  01/19/15 1830 01/20/15 0512  WBC 10.7* 10.9*  HGB 16.1 16.3  HCT 51.0 52.8*  MCV 94.8 95.5  PLT 207 227   Telemetry: Sinus rhythm.  Assessment/Plan:  1.  Acute on chronic diastolic heart failure. He is negative by diuresis now but BUN/Cr are increased and I think he is overdiuresed and diuretics now on hold 2.  Aortic stenosis at least moderate and may be more severe-the aortic stenosis very well could be contributing to his repeated admissions for respiratory failure. 3. Sinus tachycardia better   3.  Non-STEMI with elevation of troponin.  cath planned when stable.   4.  Acute on chronic renal failure worsened with diuresis and BUN /Cr. Climbing.  I think overdiuresed 5. Now with polycythemia liked volume contracted 6. History of CAD with prior  stent 7. VOmiting yesterday improved with NG tube drainage  Recommendations:  Still quite tenuous and renal failure is worse. Overdiuresed. Less tachycardic today.  Appears to be infected.   Darden Palmer  MD [facility]  01/20/2015, 8:43 AM

## 2015-01-20 NOTE — Progress Notes (Addendum)
eLink Physician-Brief Progress Note Patient Name: [name] DOB: 22-Jul-1933 MRN: [id number]   Date of Service  01/20/2015  HPI/Events of Note  Gram Positive Cocci in cluster and pair in aerobic bottle from 01/19/2015.  eICU Interventions  Will order: 1. Vancomycin per Pharmacy consultation.      Intervention Category Major Interventions: Infection - evaluation and management  Sommer,Steven Eugene 01/20/2015, 5:45 AM

## 2015-01-20 NOTE — Progress Notes (Signed)
PULMONARY / CRITICAL CARE MEDICINE   Name: [name] MRN: [id number] DOB: 01/05/1933    ADMISSION DATE:  01/09/2015 CONSULTATION DATE:  01/10/15  REFERRING MD :  Dr. Gwenlyn Perking   CHIEF COMPLAINT:  AMS, Acute Respiratory Failure   INITIAL PRESENTATION: 81 y/o M with with recent (6/17) arteriography of RLE for venous ulcers who was admitted 6/18 with a 2 week hx of SOB.  Admitted per [facility] for further evaluation.  Treated with BiPAP but failed therapy and required intubation am of 6/19.  PCCM consulted for ICU transfer.    STUDIES:  6/18  CT ABD >> post-op changes in L groin, increased density in bladder, bibasilar atx 6/18  ECHO >> LVEF 55-60%, grade II diastolic dysfunction, moderate AS (wosening since last ECHO) 6/20 EEG>>>metabolic vs drug affect, no focus  6/19  CT of Head >> Stable moderate diffuse cerebral atrophy and mild chronic small vessel white matter ischemic changes 6/20 Echo: Left ventricle: The cavity size was normal. Wall thickness was normal. Systolic function was normal. The estimated ejection fraction was in the range of 60% to 65%. Wall motion was normal;there were no regional wall motion abnormalities. 6/22: MR Brain: No infarct, age related atrophy, mild chronic small vessel disease  SIGNIFICANT EVENTS: 6/17  RLE arteriography per Dr. Edilia Bo for PVD and non-healing R toe wound 6/18  Admit to [facility] with complaints of SOB x 2 weeks 6/19  Decompensated early am, required intubation / ICU transfer 6/20-trop elevation, hep , asa 6/21: polymyoclonus with when precedex stopped 6/22: continued plt count drop to 93 down from 131,agitation required change in sedation to prop 6/23: worsening chest film, plt stable  SUBJECTIVE:  Sedation off since 8:00 Febrile this am and last night RN reports that he followed commands this am; he would not for me  VITAL SIGNS: Temp:  [97.5 F (36.4 C)-103 F (39.4 C)] 102.7 F (39.3 C) (06/29 0817) Pulse Rate:  [39-128] 99 (06/29  1152) Resp:  [16-34] 21 (06/29 1152) BP: (123-171)/(41-94) 148/41 mmHg (06/29 1152) SpO2:  [86 %-96 %] 94 % (06/29 1152) FiO2 (%):  [40 %] 40 % (06/29 1152) Weight:  [104.3 kg (229 lb 15 oz)] 104.3 kg (229 lb 15 oz) (06/29 0452)   HEMODYNAMICS: CVP:  [2 mmHg-18 mmHg] 8 mmHg   VENTILATOR SETTINGS: Vent Mode:  [-] PRVC FiO2 (%):  [40 %] 40 % Set Rate:  [12 bmp] 12 bmp Vt Set:  [560 mL] 560 mL PEEP:  [5 cmH20] 5 cmH20 Plateau Pressure:  [18 cmH20-28 cmH20] 18 cmH20   INTAKE / OUTPUT:  Intake/Output Summary (Last 24 hours) at 01/20/15 1157 Last data filed at 01/20/15 1100  Gross per 24 hour  Intake 1173.36 ml  Output   3410 ml  Net -2236.64 ml    PHYSICAL EXAMINATION: General:  Obese male Neuro:  rass -1, eyes open but does not follow commands HEENT:  ETT in place Cardiovascular:  s1s2 rrr, 3/6 harsh systolic murmur Lungs: B rhonchi, no wheeze Abdomen:  Obese/soft, + BS Musculoskeletal:  No acute deformities  Skin:  Warm/dry, gen1+ BLE edema  LABS:  CBC  Recent Labs Lab 01/18/15 0522 01/19/15 1830 01/20/15 0512  WBC 8.5 10.7* 10.9*  HGB 13.9 16.1 16.3  HCT 44.7 51.0 52.8*  PLT 180 207 227   Coag's No results for input(s): APTT, INR in the last 168 hours.   BMET  Recent Labs Lab 01/19/15 0400 01/19/15 1830 01/20/15 0512  NA 148* 153* 154*  K 3.5 3.5  3.4*  CL 99* 103 101  CO2 39* 40* 38*  BUN 108* 113* 114*  CREATININE 2.02* 2.09* 2.22*  GLUCOSE 167* 182* 177*   Electrolytes  Recent Labs Lab 01/15/15 0404  01/17/15 0500  01/19/15 0400 01/19/15 1830 01/20/15 0512  CALCIUM 8.5*  < > 9.1  < > 9.0 9.4 9.7  MG 2.4  --  2.7*  --   --   --  3.4*  PHOS 4.6  --  2.6  --   --   --  2.7  < > = values in this interval not displayed. Sepsis Markers  Recent Labs Lab 01/19/15 1830  LATICACIDVEN 1.7   ABG  Recent Labs Lab 01/15/15 1117  PHART 7.387  PCO2ART 64.0*  PO2ART 71.0*   Liver Enzymes  Recent Labs Lab 01/19/15 1830  AST 76*   ALT 34  ALKPHOS 51  BILITOT 0.7  ALBUMIN 3.1*   Cardiac Enzymes  Recent Labs Lab 01/19/15 1830 01/19/15 2041 01/20/15 0512  TROPONINI 0.41* 0.49* 0.54*   Glucose  Recent Labs Lab 01/19/15 1154 01/19/15 1605 01/19/15 1943 01/19/15 2333 01/20/15 0354 01/20/15 0815  GLUCAP 166* 175* 186* 199* 125* 172*    Imaging Dg Chest Port 1 View  01/20/2015   CLINICAL DATA:  Acute respiratory failure  EXAM: PORTABLE CHEST - 1 VIEW  COMPARISON:  01/19/2015  FINDINGS: Bibasilar airspace opacities could reflect atelectasis or infiltrates. This has increased slightly in the left base. No significant visible effusions. Heart is borderline in size. Support devices are unchanged.  IMPRESSION: Bibasilar airspace opacities, slightly increased on the left. This could represent atelectasis or infiltrate/pneumonia.   Electronically Signed   By: Charlett Nose M.D.   On: 01/20/2015 07:28   Dg Abd Portable 1v  01/19/2015   CLINICAL DATA:  Vomiting and abdominal pain for 1 day.  EXAM: PORTABLE ABDOMEN - 1 VIEW  COMPARISON:  01/13/2015, CT 01/09/2015  FINDINGS: No dilated bowel loops to suggest obstruction. No evidence of free air and mass single portable view. Projecting over the mid lower pelvis is a linear/curvilinear density. No radiopaque calculi. Degenerative change throughout spine.  IMPRESSION: 1. No evidence of bowel obstruction or free air on this single view. 2. Linear/curvilinear density projecting over the central pelvis, this is not seen on prior exams. This may be external to the patient. This is otherwise nonspecific and of uncertain etiology.   Electronically Signed   By: Rubye Oaks M.D.   On: 01/19/2015 18:36     ASSESSMENT / PLAN:  PULMONARY OETT 6/19 >>> A: Acute Respiratory Failure  Likely pulm edema  From AS, ischemia Increased bilateral diffuse patchy opacifiction  Never Smoker  OSA  P:   -have pushed diuresis as far as possible, lasix currently on hold -PSV as able -BD  prn -restart empiric abx as below -treating abdomenal distention, improved  CARDIOVASCULAR CVL L IJ 6/19 >>  A:  Hypotension - cardiogenic shock, improved Aortic Stenosis - worsening of valvular disease, d/w cards CAD s/p PCI (remote) PVD s/p Arteriography 6/17 per Dr. Edilia Bo  NSTEMI P:  -ICU monitoring -Heparin gtt stopped, converted to heparin sq -possible L cath / PCI in future if other issues stabilize -ASA, Plavix  -aggressively diuresed as above  RENAL A:   Acute on Chronic Kidney Injury, has worsened some w diuresis Hypernatremia P:   -increase free water 6/29 -follow BMP and I/O off lasix  GASTROINTESTINAL / GU  A:   GERD BPH - difficult to place catheter  Constipation-kub 6/22 no obstruction, improved 6/26 Emesis P:   -TF held overnight due to emesis, will try trickle feeds  -ppi  HEMATOLOGIC A:  DVt prevention Thrombocytopenia (dilutional), noted low in past P:  DVT prophylaxis: heparin drip  INFECTIOUS A:   Bibasilar Airspace Disease - possible HCAP but infiltrates clearing w diuresis Recent UTI GPC bacteremia P:   BCx2 6/19 >> No blood cx collected  Sputum 6/19 >> Likely OPF UC 6/19 >>Pseudomonas Aeruginosa 20 K (doubt pathogen) resp 6/28 >>  Blood 6/28 >> GPC >>  Urine 6/28 >> negative  Ceftriaxone>>>6/22 >> 12/28/23 Vanco, start date 6/19>>>6/22; 6/29 >>  Zosyn, start date 6/19>>>6/22; 6/29 >>   Persistent fever, new report GPC bacteremia 6/29 Restart empiric vanco, pip/tazo 6/29 Follow cx data for speciation May need to consider repeat TTE given new GPC bacteremia  ENDOCRINE A:   Controlled Hyperglycemia  P:   Continue SSI Q4 blood glucose checks  NEUROLOGIC A:  Acute Encephalopathy- MR brain 6/22 negative for infarct Chronic facial twitch P:   RASS goal: -1 to 0 propofol gtt wua Fentanyl gtt for pain  PRN versed for sedation  FAMILY  - Updates:  Spoke with the patient's daughter at bedside 6/28  - Inter-disciplinary  family meet or Palliative Care meeting due by: 6/26 done by DF  Independent CC time 35 minutes  Levy Pupa, MD, PhD 01/20/2015, 11:57 AM [facility] [phone] or if no answer [phone]

## 2015-01-21 ENCOUNTER — Inpatient Hospital Stay (HOSPITAL_COMMUNITY): Payer: Medicare Other

## 2015-01-21 LAB — BASIC METABOLIC PANEL
ANION GAP: 14 (ref 5–15)
BUN: 127 mg/dL — AB (ref 6–20)
CALCIUM: 9.4 mg/dL (ref 8.9–10.3)
CO2: 36 mmol/L — AB (ref 22–32)
Chloride: 105 mmol/L (ref 101–111)
Creatinine, Ser: 2.71 mg/dL — ABNORMAL HIGH (ref 0.61–1.24)
GFR, EST AFRICAN AMERICAN: 24 mL/min — AB (ref >60)
GFR, EST NON AFRICAN AMERICAN: 21 mL/min — AB (ref >60)
Glucose, Bld: 204 mg/dL — ABNORMAL HIGH (ref 65–99)
Potassium: 3.2 mmol/L — ABNORMAL LOW (ref 3.5–5.1)
Sodium: 155 mmol/L — ABNORMAL HIGH (ref 135–145)

## 2015-01-21 LAB — CBC
HCT: 51.1 % (ref 39.0–52.0)
Hemoglobin: 15.5 g/dL (ref 13.0–17.0)
MCH: 29.2 pg (ref 26.0–34.0)
MCHC: 30.3 g/dL (ref 30.0–36.0)
MCV: 96.4 fL (ref 78.0–100.0)
Platelets: 211 10*3/uL (ref 150–400)
RBC: 5.3 MIL/uL (ref 4.22–5.81)
RDW: 16.8 % — ABNORMAL HIGH (ref 11.5–15.5)
WBC: 11.5 10*3/uL — ABNORMAL HIGH (ref 4.0–10.5)

## 2015-01-21 LAB — CULTURE, RESPIRATORY

## 2015-01-21 LAB — GLUCOSE, CAPILLARY
GLUCOSE-CAPILLARY: 158 mg/dL — AB (ref 65–99)
Glucose-Capillary: 127 mg/dL — ABNORMAL HIGH (ref 65–99)
Glucose-Capillary: 161 mg/dL — ABNORMAL HIGH (ref 65–99)
Glucose-Capillary: 165 mg/dL — ABNORMAL HIGH (ref 65–99)
Glucose-Capillary: 167 mg/dL — ABNORMAL HIGH (ref 65–99)
Glucose-Capillary: 180 mg/dL — ABNORMAL HIGH (ref 65–99)

## 2015-01-21 LAB — CULTURE, RESPIRATORY W GRAM STAIN

## 2015-01-21 LAB — MAGNESIUM: Magnesium: 3.4 mg/dL — ABNORMAL HIGH (ref 1.7–2.4)

## 2015-01-21 LAB — CLOSTRIDIUM DIFFICILE BY PCR: CDIFFPCR: NEGATIVE

## 2015-01-21 LAB — PHOSPHORUS: Phosphorus: 4.7 mg/dL — ABNORMAL HIGH (ref 2.5–4.6)

## 2015-01-21 MED ORDER — PRO-STAT SUGAR FREE PO LIQD
60.0000 mL | Freq: Two times a day (BID) | ORAL | Status: DC
  Administered 2015-01-21 – 2015-01-28 (×8): 60 mL
  Filled 2015-01-21 (×17): qty 60

## 2015-01-21 MED ORDER — DEXTROSE 5 % IV SOLN
INTRAVENOUS | Status: DC
  Administered 2015-01-21: 30 mL via INTRAVENOUS
  Administered 2015-01-22 – 2015-01-24 (×4): via INTRAVENOUS

## 2015-01-21 MED ORDER — VITAL HIGH PROTEIN PO LIQD
1000.0000 mL | ORAL | Status: DC
  Administered 2015-01-21 – 2015-01-22 (×2): 1000 mL
  Filled 2015-01-21 (×9): qty 1000

## 2015-01-21 NOTE — Progress Notes (Signed)
PULMONARY / CRITICAL CARE MEDICINE   Name: [name] MRN: [id number] DOB: 17-Jun-1933    ADMISSION DATE:  01/09/2015 CONSULTATION DATE:  01/10/15  REFERRING MD :  Dr. Gwenlyn Perking   CHIEF COMPLAINT:  AMS, Acute Respiratory Failure   INITIAL PRESENTATION: 81 y/o M with with recent (6/17) arteriography of RLE for venous ulcers who was admitted 6/18 with a 2 week hx of SOB.  Admitted per [facility] for further evaluation.  Treated with BiPAP but failed therapy and required intubation am of 6/19.  PCCM consulted for ICU transfer.    STUDIES:  6/18  CT ABD >> post-op changes in L groin, increased density in bladder, bibasilar atx 6/18  ECHO >> LVEF 55-60%, grade II diastolic dysfunction, moderate AS (wosening since last ECHO) 6/20 EEG>>>metabolic vs drug affect, no focus  6/19  CT of Head >> Stable moderate diffuse cerebral atrophy and mild chronic small vessel white matter ischemic changes 6/20 Echo: Left ventricle: The cavity size was normal. Wall thickness was normal. Systolic function was normal. The estimated ejection fraction was in the range of 60% to 65%. Wall motion was normal;there were no regional wall motion abnormalities. 6/22: MR Brain: No infarct, age related atrophy, mild chronic small vessel disease  SIGNIFICANT EVENTS: 6/17  RLE arteriography per Dr. Edilia Bo for PVD and non-healing R toe wound 6/18  Admit to [facility] ([place]) with complaints of SOB x 2 weeks 6/19  Decompensated early am, required intubation / ICU transfer 6/20-trop elevation, hep , asa 6/21: polymyoclonus with when precedex stopped 6/22: continued plt count drop to 93 down from 131,agitation required change in sedation to prop 6/23: worsening chest film, plt stable  SUBJECTIVE:  More awake this am, following some commands TF restarted without emesis  VITAL SIGNS: Temp:  [99.4 F (37.4 C)-102.9 F (39.4 C)] 99.7 F (37.6 C) (06/30 1129) Pulse Rate:  [43-119] 79 (06/30 1200) Resp:  [14-24] 15 (06/30 1200) BP:  (113-175)/(40-80) 141/65 mmHg (06/30 1200) SpO2:  [88 %-97 %] 96 % (06/30 1200) FiO2 (%):  [40 %] 40 % (06/30 1137) Weight:  [102.8 kg (226 lb 10.1 oz)] 102.8 kg (226 lb 10.1 oz) (06/30 0500)   HEMODYNAMICS: CVP:  [5 mmHg-7 mmHg] 5 mmHg   VENTILATOR SETTINGS: Vent Mode:  [-] PRVC FiO2 (%):  [40 %] 40 % Set Rate:  [12 bmp] 12 bmp Vt Set:  [560 mL] 560 mL PEEP:  [5 cmH20] 5 cmH20 Plateau Pressure:  [16 cmH20-20 cmH20] 19 cmH20   INTAKE / OUTPUT:  Intake/Output Summary (Last 24 hours) at 01/21/15 1342 Last data filed at 01/21/15 1200  Gross per 24 hour  Intake 1872.29 ml  Output   2295 ml  Net -422.71 ml    PHYSICAL EXAMINATION: General:  Obese male Neuro:  rass -1, opens eyes, follows commands and nods to questions HEENT:  ETT in place Cardiovascular:  s1s2 rrr, 3/6 harsh systolic murmur Lungs: B rhonchi, no wheeze Abdomen:  Obese/soft, + BS Musculoskeletal:  No acute deformities  Skin:  Warm/dry, gen1+ BLE edema  LABS:  CBC  Recent Labs Lab 01/19/15 1830 01/20/15 0512 01/21/15 0231  WBC 10.7* 10.9* 11.5*  HGB 16.1 16.3 15.5  HCT 51.0 52.8* 51.1  PLT 207 227 211   Coag's No results for input(s): APTT, INR in the last 168 hours.   BMET  Recent Labs Lab 01/19/15 1830 01/20/15 0512 01/21/15 0231  NA 153* 154* 155*  K 3.5 3.4* 3.2*  CL 103 101 105  CO2 40* 38* 36*  BUN 113* 114* 127*  CREATININE 2.09* 2.22* 2.71*  GLUCOSE 182* 177* 204*   Electrolytes  Recent Labs Lab 01/17/15 0500  01/19/15 1830 01/20/15 0512 01/21/15 0231  CALCIUM 9.1  < > 9.4 9.7 9.4  MG 2.7*  --   --  3.4* 3.4*  PHOS 2.6  --   --  2.7 4.7*  < > = values in this interval not displayed. Sepsis Markers  Recent Labs Lab 01/19/15 1830  LATICACIDVEN 1.7   ABG  Recent Labs Lab 01/15/15 1117  PHART 7.387  PCO2ART 64.0*  PO2ART 71.0*   Liver Enzymes  Recent Labs Lab 01/19/15 1830  AST 76*  ALT 34  ALKPHOS 51  BILITOT 0.7  ALBUMIN 3.1*   Cardiac  Enzymes  Recent Labs Lab 01/19/15 2041 01/20/15 0512 01/20/15 1435  TROPONINI 0.49* 0.54* 0.42*   Glucose  Recent Labs Lab 01/20/15 1614 01/20/15 2042 01/20/15 2347 01/21/15 0332 01/21/15 0723 01/21/15 1127  GLUCAP 160* 179* 165* 167* 158* 161*    Imaging Dg Chest Port 1 View  01/21/2015   CLINICAL DATA:  Respiratory failure  EXAM: PORTABLE CHEST - 1 VIEW  COMPARISON:  01/20/2015  FINDINGS: The endotracheal tube is 2.7 cm above the carina. The nasogastric tube extends into the stomach. Central and basilar opacities persist bilaterally without significant interval change. There is no pneumothorax  IMPRESSION: Support equipment appears satisfactorily positioned.  No significant interval change in the bilateral airspace opacities.   Electronically Signed   By: Ellery Plunk M.D.   On: 01/21/2015 07:06     ASSESSMENT / PLAN:  PULMONARY OETT 6/19 >>> A: Acute Respiratory Failure  Likely pulm edema  From AS, ischemia Increased bilateral diffuse patchy opacification, improved Never Smoker  OSA  P:   -have pushed diuresis as far as possible, lasix currently on hold -PSV as able -BD prn -treating abdomenal distention, improved  CARDIOVASCULAR CVL L IJ 6/19 >>  A:  Hypotension - cardiogenic shock, improved Aortic Stenosis - worsening of valvular disease, d/w cards At high risk for endocarditis CAD s/p PCI (remote) PVD s/p Arteriography 6/17 per Dr. Edilia Bo  NSTEMI P:  -ICU monitoring -Heparin gtt stopped, converted to heparin sq -possible L cath / PCI in future if other issues stabilize -ASA, Plavix  -aggressively diuresed as above, lasix on hold.   RENAL A:   Acute on Chronic Kidney Injury, has worsened some w diuresis Hypernatremia P:   -increase free water 6/29 -add low dose d5w 6/30 -follow BMP and I/O off lasix  GASTROINTESTINAL / GU  A:   GERD BPH - difficult to place catheter Constipation-kub 6/22 no obstruction, improved 6/26 Emesis,  resolved P:   -TF restarted  -ppi  HEMATOLOGIC A:  DVt prevention Thrombocytopenia (dilutional), noted low in past P:  DVT prophylaxis: heparin drip  INFECTIOUS A:   Bibasilar Airspace Disease - possible HCAP but infiltrates clearing w diuresis Recent UTI GPC bacteremia At HIGH risk for AV endocarditis P:   BCx2 6/19 >> No blood cx collected  Sputum 6/19 >> Likely OPF UC 6/19 >>Pseudomonas Aeruginosa 20 K (doubt pathogen) resp 6/28 >> normal flora Blood 6/28 >> GPC >>  Urine 6/28 >> negative  Ceftriaxone>>>6/22 >> 12/28/23 Vanco, start date 6/19>>>6/22; 6/29 >>  Zosyn, start date 6/19>>>6/22; 6/29 >>   At high risk for endocarditis. I suspect that we will need to go ahead and commit to 6 weeks of therapy up front. Not clear that repeat TTE or TEE will change management at  this point. Will likely stop zosyn 7/1 and follow on vanco  ENDOCRINE A:   Controlled Hyperglycemia  P:   Continue SSI Q4 blood glucose checks  NEUROLOGIC A:  Acute Encephalopathy- MR brain 6/22 negative for infarct; improved 6/30 Chronic facial twitch P:   RASS goal: -1 to 0 Fentanyl prn Restart precedex if needed, will try to avoid [place] CT since he has improved his MS on 6/30 am  FAMILY  - Updates:  Spoke with the patient's daughter at bedside 6/28, pt's son on 6/29  - Inter-disciplinary family meet or Palliative Care meeting due by: 6/26 done by DF  Independent CC time 35 minutes  Levy Pupa, MD, PhD 01/21/2015, 1:42 PM [facility] [phone] or if no answer [phone]

## 2015-01-21 NOTE — Progress Notes (Signed)
Nutrition Follow-up  DOCUMENTATION CODES:  Obesity unspecified  INTERVENTION:  Adjust TF after Propofol d/c'ed:  Vital High Protein @ 45 ml/hr (1080 ml per 24 hours) 30 ml Prostat TID  Provides: 1480 kcal, 154 grams protein, and 902 ml H2O Total free water: 2702 ml (MD adjusting)  NUTRITION DIAGNOSIS:  Inadequate oral intake related to inability to eat as evidenced by NPO status.  ongoing  GOAL:  Provide needs based on ASPEN/SCCM guidelines  Not met  MONITOR:  Vent status, TF tolerance, Skin, Weight trends, Labs  REASON FOR ASSESSMENT:  Consult Enteral/tube feeding initiation and management  ASSESSMENT: Patient admitted on 6/18 with a 2 week hx of SOB. Failed BiPAP therapy and required intubation on 6/19.   Patient is currently intubated on ventilator support MV: 8 L/min Temp (24hrs), Avg:101.3 F (38.5 C), Min:99.4 F (37.4 C), Max:102.9 F (39.4 C)  Propofol: d/c'ed 6/28  Per RN pt vomited 6/28, tf held x 24 hrs and restarted and has been tolerating.  Abd xray (6/28) reports no dilated loops of the bowel and no obstruction Rectal pouch placed 6/24. Pt had been constipated and then given meds and began to have loose stool. OG tube (tip in stomach) Vital High Protein @ 15 ml/hr with 60 ml Prostat QID and 300 ml free water every 4 hours. Provides: 1160 kcals, 152 gm protein, 301 ml free water daily, total free water: 2101 ml Labs reviewed: Phos, Magnesium, BUN/Cr elevated Medications reviewed.   Height:  Ht Readings from Last 1 Encounters:  01/10/15 _0  (1.753 m)    Weight:  Wt Readings from Last 1 Encounters:  01/21/15 226 lb 10.1 oz (102.8 kg)    Ideal Body Weight:  72.7 kg  Wt Readings from Last 10 Encounters:  01/21/15 226 lb 10.1 oz (102.8 kg)  01/08/15 240 lb (108.863 kg)  12/23/14 247 lb 14.4 oz (112.447 kg)  09/11/14 244 lb 12.8 oz (111.041 kg)  09/10/14 246 lb 12 oz (111.925 kg)  08/27/14 235 lb (106.595 kg)  08/18/14 247 lb  (112.038 kg)  08/05/14 241 lb (109.317 kg)  07/31/14 237 lb (107.502 kg)  07/17/14 236 lb 9.6 oz (107.321 kg)    BMI:  Body mass index is 33.45 kg/(m^2).  Estimated Nutritional Needs:  Kcal:  2409-7353  Protein:  145 gm  Fluid:  2 L  Skin:    stage II ulcer on foot, Deep tissue injury on sacrum   Diet Order:  Diet NPO time specified  EDUCATION NEEDS:  No education needs identified at this time   Intake/Output Summary (Last 24 hours) at 01/21/15 1409 Last data filed at 01/21/15 1400  Gross per 24 hour  Intake 1897.29 ml  Output   2320 ml  Net -422.71 ml    Last BM:  6/29 via rectal pouch  [place], [place], [place] Pager [phone] After Hours Pager

## 2015-01-21 NOTE — Progress Notes (Signed)
Subjective:  Intubated, but with eyes open.  Restrained.   Difficult to tell is follows commands, but more alert today. Continued fever.   Objective:  Vital Signs in the last 24 hours: BP 131/58 mmHg  Pulse 99  Temp(Src) 102.7 F (39.3 C) (Oral)  Resp 17  Ht 5\' 9"  (1.753 m)  Wt 104.3 kg (229 lb 15 oz)  BMI 33.94 kg/m2  SpO2 94%  Physical Exam: Obese somewhat plethoric male intubated, opens eys to voice but does not follow commands Lungs:  Reduced breath sounds  Cardiac:  Regular rhythm, normal S1 and S2, no S3, harsh 3/6 holosystolic murmur Abdomen:  Soft, nontender, no masses, obese Extremities:no edema noted, dry ulcer on tip of toe on right. Dressing present on lower left and right leg  Intake/Output from previous day: 06/29 0701 - 06/30 0700 In: 2238.1 [I.V.:143.1; NG/GT:1495; IV Piggyback:600] Out: 2470 [Urine:1770; Emesis/NG output:700]  Weight Filed Weights   01/19/15 0500 01/20/15 0452 01/21/15 0500  Weight: 108 kg (238 lb 1.6 oz) 104.3 kg (229 lb 15 oz) 102.8 kg (226 lb 10.1 oz)    Lab Results: Basic Metabolic Panel:  Recent Labs  09/32/35 0512 01/21/15 0231  NA 154* 155*  K 3.4* 3.2*  CL 101 105  CO2 38* 36*  GLUCOSE 177* 204*  BUN 114* 127*  CREATININE 2.22* 2.71*   CBC:  Recent Labs  01/20/15 0512 01/21/15 0231  WBC 10.9* 11.5*  HGB 16.3 15.5  HCT 52.8* 51.1  MCV 95.5 96.4  PLT 227 211   Telemetry: Sinus rhythm with PVC's and bigeminy.  Some sinus tachycardia last night  Assessment/Plan:  1. Fever and gram positive  bacteremia - currently on antibiotics awaiting identification with murmur need to be sure does not now have SBE. Central line removed.  2.  Acute on chronic diastolic heart failure. Currently overdiuresed and dry 3.  Aortic stenosis at least moderate and may be more severe-the aortic stenosis very well could be contributing to his repeated admissions for respiratory failure. 4. Sinus tachycardia with PVC's may be 5.   Non-STEMI with elevation of troponin.  cath planned when stable.   6.  Acute on chronic renal failure worsened with diuresis and BUN /Cr. Climbing.  Still with urine output  Clinically quite dry.   Recommendations:  Appears more alert.  Would not diurese further.  Await ID of organism.  Watch fever curve to see response. Is high risk for SBE  [name]  MD [facility]  01/21/2015, 8:39 AM

## 2015-01-22 LAB — BASIC METABOLIC PANEL
Anion gap: 12 (ref 5–15)
Anion gap: 12 (ref 5–15)
BUN: 127 mg/dL — AB (ref 6–20)
BUN: 114 mg/dL — AB (ref 6–20)
CALCIUM: 9.3 mg/dL (ref 8.9–10.3)
CO2: 34 mmol/L — AB (ref 22–32)
CO2: 35 mmol/L — AB (ref 22–32)
CREATININE: 2.24 mg/dL — AB (ref 0.61–1.24)
Calcium: 9.4 mg/dL (ref 8.9–10.3)
Chloride: 108 mmol/L (ref 101–111)
Chloride: 111 mmol/L (ref 101–111)
Creatinine, Ser: 2.31 mg/dL — ABNORMAL HIGH (ref 0.61–1.24)
GFR calc Af Amer: 29 mL/min — ABNORMAL LOW (ref >60)
GFR calc Af Amer: 30 mL/min — ABNORMAL LOW (ref >60)
GFR calc non Af Amer: 25 mL/min — ABNORMAL LOW (ref >60)
GFR calc non Af Amer: 26 mL/min — ABNORMAL LOW (ref >60)
Glucose, Bld: 216 mg/dL — ABNORMAL HIGH (ref 65–99)
Glucose, Bld: 208 mg/dL — ABNORMAL HIGH (ref 65–99)
POTASSIUM: 3.2 mmol/L — AB (ref 3.5–5.1)
Potassium: 2.7 mmol/L — CL (ref 3.5–5.1)
SODIUM: 154 mmol/L — AB (ref 135–145)
SODIUM: 158 mmol/L — AB (ref 135–145)

## 2015-01-22 LAB — GLUCOSE, CAPILLARY
GLUCOSE-CAPILLARY: 124 mg/dL — AB (ref 65–99)
GLUCOSE-CAPILLARY: 210 mg/dL — AB (ref 65–99)
Glucose-Capillary: 158 mg/dL — ABNORMAL HIGH (ref 65–99)
Glucose-Capillary: 169 mg/dL — ABNORMAL HIGH (ref 65–99)
Glucose-Capillary: 174 mg/dL — ABNORMAL HIGH (ref 65–99)
Glucose-Capillary: 191 mg/dL — ABNORMAL HIGH (ref 65–99)
Glucose-Capillary: 236 mg/dL — ABNORMAL HIGH (ref 65–99)

## 2015-01-22 MED ORDER — POTASSIUM CHLORIDE 10 MEQ/100ML IV SOLN
10.0000 meq | INTRAVENOUS | Status: AC
Start: 2015-01-22 — End: 2015-01-22
  Administered 2015-01-22 (×3): 10 meq via INTRAVENOUS
  Filled 2015-01-22 (×3): qty 100

## 2015-01-22 MED ORDER — FREE WATER
350.0000 mL | Status: DC
  Administered 2015-01-22 – 2015-01-23 (×5): 350 mL

## 2015-01-22 NOTE — Progress Notes (Signed)
Patient gagging and throwing up tube feeding .Dr Delton Coombes noted .changed orogastric to intermittent suction 400 cc tube feeding and bile out .Suctioned ETT and orally and oral care given

## 2015-01-22 NOTE — Progress Notes (Signed)
Subjective:  Intubated, but with eyes open and appears calm and able to follow some commands.  Not tachypneic. Objective:  Vital Signs in the last 24 hours: BP 131/58 mmHg  Pulse 99  Temp(Src) 102.7 F (39.3 C) (Oral)  Resp 17  Ht 5\' 9"  (1.753 m)  Wt 104.3 kg (229 lb 15 oz)  BMI 33.94 kg/m2  SpO2 94%  Physical Exam: Obese somewhat plethoric male intubated, opens eys to voice follows commands Lungs:  Reduced breath sounds  Cardiac:  Regular rhythm, normal S1 and S2, no S3, harsh 3/6 holosystolic murmur Abdomen:  Soft, nontender, no masses, obese Extremities :no edema noted, dry ulcer on tip of toe on right. Dressing present on lower left and right leg  Intake/Output from previous day: 06/30 0701 - 07/01 0700 In: 3487.5 [I.V.:470; NG/GT:2130; IV Piggyback:887.5] Out: 3445 [Urine:2795; Stool:650]  Weight Filed Weights   01/20/15 0452 01/21/15 0500 01/22/15 0500  Weight: 104.3 kg (229 lb 15 oz) 102.8 kg (226 lb 10.1 oz) 103.4 kg (227 lb 15.3 oz)    Lab Results: Basic Metabolic Panel:  Recent Labs  14/78/29 0222 01/22/15 1215  NA 154* 158*  K 2.7* 3.2*  CL 108 111  CO2 34* 35*  GLUCOSE 216* 208*  BUN 127* 114*  CREATININE 2.31* 2.24*   CBC:  Recent Labs  01/20/15 0512 01/21/15 0231  WBC 10.9* 11.5*  HGB 16.3 15.5  HCT 52.8* 51.1  MCV 95.5 96.4  PLT 227 211   Telemetry: Sinus rhythm with PVC's and bigeminy.  Some sinus tachycardia last night  Assessment/Plan:  1. Fever and gram positive  bacteremia - currently on antibiotics awaiting identification with murmur need to be sure does not now have SBE. Central line removed. Discussed with Dr. Solon Augusta.  No role for TEE would treat for 4 weeks with abnormal valve. 2.  Acute on chronic diastolic heart failure. Currently overdiuresed and dry 3.  Aortic stenosis at least moderate and may be more severe-the aortic stenosis very well could be contributing to his repeated admissions for respiratory failure. 4. Sinus  rhythm with PVC's 5.  Non-STEMI with elevation of troponin.  cath planned when stable.   6.  Acute on chronic renal failure worsened with diuresis and BUN /Cr starting to come back down.  Still with urine output  Clinically quite dry. 7. Hypernatremia  - needs more free water. No diuresis.    Recommendations:  Appears more alert.  HOpefully with treatment of bacteremia will be able to extubate.    Darden Palmer  MD [facility]  01/22/2015, 4:57 PM

## 2015-01-22 NOTE — Progress Notes (Signed)
eLink Physician-Brief Progress Note Patient Name: [name] DOB: November 10, 1932 MRN: [id number]   Date of Service  01/22/2015  HPI/Events of Note  K+ = 2.7, Creatinine = 2.31 (improving) and making 150 mL/hour of urine.   eICU Interventions  Replete K+.     Intervention Category Major Interventions: Electrolyte abnormality - evaluation and management  [name] 01/22/2015, 5:09 AM

## 2015-01-22 NOTE — Progress Notes (Signed)
CRITICAL VALUE ALERT  Critical value received:  K+ 2.7  Date of notification:  01-22-15  Time of notification:  0430  Critical value read back:Yes.    Nurse who received alert:  Kris Hartmann  MD notified (1st page): MD Sommers  Time of first page:  0500  Responding MD: MD Dellie Catholic  Time MD responded:  0500

## 2015-01-22 NOTE — Progress Notes (Signed)
PULMONARY / CRITICAL CARE MEDICINE   Name: [name] MRN: [id number] DOB: 09-02-1932    ADMISSION DATE:  01/09/2015 CONSULTATION DATE:  01/10/15  REFERRING MD :  Dr. Gwenlyn Perking   CHIEF COMPLAINT:  AMS, Acute Respiratory Failure   INITIAL PRESENTATION: 81 y/o M with with recent (6/17) arteriography of RLE for venous ulcers who was admitted 6/18 with a 2 week hx of SOB.  Admitted per [facility] for further evaluation.  Treated with BiPAP but failed therapy and required intubation am of 6/19.  PCCM consulted for ICU transfer.    STUDIES:  6/18  CT ABD >> post-op changes in L groin, increased density in bladder, bibasilar atx 6/18  ECHO >> LVEF 55-60%, grade II diastolic dysfunction, moderate AS (wosening since last ECHO) 6/20 EEG>>>metabolic vs drug affect, no focus  6/19  CT of Head >> Stable moderate diffuse cerebral atrophy and mild chronic small vessel white matter ischemic changes 6/20 Echo: Left ventricle: The cavity size was normal. Wall thickness was normal. Systolic function was normal. The estimated ejection fraction was in the range of 60% to 65%. Wall motion was normal;there were no regional wall motion abnormalities. 6/22: MR Brain: No infarct, age related atrophy, mild chronic small vessel disease  SIGNIFICANT EVENTS: 6/17  RLE arteriography per Dr. Edilia Bo for PVD and non-healing R toe wound 6/18  Admit to [facility] with complaints of SOB x 2 weeks 6/19  Decompensated early am, required intubation / ICU transfer 6/20-trop elevation, hep , asa 6/21: polymyoclonus with when precedex stopped 6/22: continued plt count drop to 93 down from 131,agitation required change in sedation to prop 6/23: worsening chest film, plt stable  SUBJECTIVE:  More awake this am, following some commands TF restarted without emesis  VITAL SIGNS: Temp:  [99.7 F (37.6 C)-102.7 F (39.3 C)] 100.1 F (37.8 C) (07/01 1127) Pulse Rate:  [85-108] 85 (07/01 1400) Resp:  [12-30] 14 (07/01 1400) BP:  (114-195)/(48-82) 127/56 mmHg (07/01 1512) SpO2:  [93 %-99 %] 99 % (07/01 1400) FiO2 (%):  [40 %] 40 % (07/01 1512) Weight:  [103.4 kg (227 lb 15.3 oz)] 103.4 kg (227 lb 15.3 oz) (07/01 0500)   HEMODYNAMICS:     VENTILATOR SETTINGS: Vent Mode:  [-] PRVC FiO2 (%):  [40 %] 40 % Set Rate:  [12 bmp] 12 bmp Vt Set:  [560 mL] 560 mL PEEP:  [5 cmH20] 5 cmH20 Pressure Support:  [14 cmH20-18 cmH20] 14 cmH20 Plateau Pressure:  [15 cmH20-27 cmH20] 15 cmH20   INTAKE / OUTPUT:  Intake/Output Summary (Last 24 hours) at 01/22/15 1532 Last data filed at 01/22/15 1400  Gross per 24 hour  Intake 3737.5 ml  Output   3795 ml  Net  -57.5 ml    PHYSICAL EXAMINATION: General:  Obese male Neuro:  rass -1, opens eyes, follows commands and nods to questions HEENT:  ETT in place Cardiovascular:  s1s2 rrr, 3/6 harsh systolic murmur Lungs: B rhonchi, no wheeze Abdomen:  Obese/soft, + BS Musculoskeletal:  No acute deformities  Skin:  Warm/dry, gen1+ BLE edema  LABS:  CBC  Recent Labs Lab 01/19/15 1830 01/20/15 0512 01/21/15 0231  WBC 10.7* 10.9* 11.5*  HGB 16.1 16.3 15.5  HCT 51.0 52.8* 51.1  PLT 207 227 211   Coag's No results for input(s): APTT, INR in the last 168 hours.   BMET  Recent Labs Lab 01/21/15 0231 01/22/15 0222 01/22/15 1215  NA 155* 154* 158*  K 3.2* 2.7* 3.2*  CL 105 108 111  CO2 36* 34* 35*  BUN 127* 127* 114*  CREATININE 2.71* 2.31* 2.24*  GLUCOSE 204* 216* 208*   Electrolytes  Recent Labs Lab 01/17/15 0500  01/20/15 0512 01/21/15 0231 01/22/15 0222 01/22/15 1215  CALCIUM 9.1  < > 9.7 9.4 9.3 9.4  MG 2.7*  --  3.4* 3.4*  --   --   PHOS 2.6  --  2.7 4.7*  --   --   < > = values in this interval not displayed. Sepsis Markers  Recent Labs Lab 01/19/15 1830  LATICACIDVEN 1.7   ABG No results for input(s): PHART, PCO2ART, PO2ART in the last 168 hours. Liver Enzymes  Recent Labs Lab 01/19/15 1830  AST 76*  ALT 34  ALKPHOS 51  BILITOT  0.7  ALBUMIN 3.1*   Cardiac Enzymes  Recent Labs Lab 01/19/15 2041 01/20/15 0512 01/20/15 1435  TROPONINI 0.49* 0.54* 0.42*   Glucose  Recent Labs Lab 01/21/15 1632 01/21/15 2036 01/21/15 2355 01/22/15 0438 01/22/15 0743 01/22/15 1126  GLUCAP 180* 127* 174* 191* 158* 236*    Imaging No results found.   ASSESSMENT / PLAN:  PULMONARY OETT 6/19 >>> A: Acute Respiratory Failure  Likely pulm edema  From AS, ischemia Increased bilateral diffuse patchy opacification, improved Never Smoker  OSA  P:   -have pushed diuresis as far as possible (and he has had a post-ATN auto-diuresis), note hypernatremia -PSV as able -BD prn -treated abdomenal distention, improved - believe that he is close to extubation, MS is improved, WOB less on PSV   CARDIOVASCULAR CVL L IJ 6/19 >>  A:  Hypotension - cardiogenic shock, improved Aortic Stenosis - worsening of valvular disease, d/w cards At high risk for endocarditis, presumed AV endocarditis  CAD s/p PCI (remote) PVD s/p Arteriography 6/17 per Dr. Edilia Bo  NSTEMI P:  -ICU monitoring -Heparin gtt stopped, converted to heparin sq -possible L cath / PCI in future if other issues stabilize -ASA, Plavix  -aggressively diuresed as above, lasix on hold.  -appreciate Dr York Spaniel assistance, agree that he will likely need to receive full course abx for endocarditis  RENAL A:   Acute on Chronic Kidney Injury, worsened some w diuresis Hypernatremia, exacerbated by post-ATN auto-diuresis P:   -increased free water 6/29 but Na rising, will increase again 7/1 -D5W -follow BMP and I/O off lasix  GASTROINTESTINAL / GU  A:   GERD BPH - difficult to place catheter Constipation-kub 6/22 no obstruction, improved 6/26 Emesis, resolved P:   -TF restarted but still w some emesis, gagging. Better positioning of OGT seems to have helped this some -ppi  HEMATOLOGIC A:  DVt prevention Thrombocytopenia (dilutional), noted low in  past P:  DVT prophylaxis: heparin subcut  INFECTIOUS A:   Bibasilar Airspace Disease - possible HCAP but infiltrates clearing w diuresis Recent UTI GPC bacteremia At HIGH risk for AV endocarditis P:   BCx2 6/19 >> No blood cx collected  Sputum 6/19 >> Likely OPF UC 6/19 >>Pseudomonas Aeruginosa 20 K (doubt pathogen) resp 6/28 >> normal flora Blood 6/28 >> GPC >>  Urine 6/28 >> negative  Ceftriaxone>>>6/22 >> 12/28/23 Vanco, start date 6/19>>>6/22; 6/29 >>  Zosyn, start date 6/19>>>6/22; 6/29 >> 7/1  At high risk for endocarditis. I suspect that we will need to go ahead and commit to 6 weeks of therapy up front. Not clear that repeat TTE or TEE will change management at this point. Will stop zosyn 7/1 and follow on vanco  ENDOCRINE A:   Controlled Hyperglycemia  P:   Continue SSI Q4 blood glucose checks  NEUROLOGIC A:  Acute Encephalopathy- MR brain 6/22 negative for infarct; improved 6/30 Chronic facial twitch P:   RASS goal: -1 to 0 Fentanyl prn Restart precedex if needed, will try to avoid Def[place] CT since he has improved his MS  FAMILY  - Updates:  Spoke with the patient's daughter at bedside 6/28, pt's son on 6/29, 7/1  - Inter-disciplinary family meet or Palliative Care meeting due by: 6/26 done by DF  Independent CC time 35 minutes  Levy Pupa, MD, PhD 01/22/2015, 3:32 PM [facility] [phone] or if no answer [phone]

## 2015-01-23 ENCOUNTER — Inpatient Hospital Stay (HOSPITAL_COMMUNITY): Payer: Medicare Other

## 2015-01-23 LAB — BASIC METABOLIC PANEL
Anion gap: 10 (ref 5–15)
Anion gap: 8 (ref 5–15)
BUN: 98 mg/dL — AB (ref 6–20)
BUN: 79 mg/dL — AB (ref 6–20)
CO2: 34 mmol/L — AB (ref 22–32)
CO2: 32 mmol/L (ref 22–32)
CREATININE: 1.8 mg/dL — AB (ref 0.61–1.24)
Calcium: 9.1 mg/dL (ref 8.9–10.3)
Calcium: 9.2 mg/dL (ref 8.9–10.3)
Chloride: 110 mmol/L (ref 101–111)
Chloride: 113 mmol/L — ABNORMAL HIGH (ref 101–111)
Creatinine, Ser: 1.46 mg/dL — ABNORMAL HIGH (ref 0.61–1.24)
GFR calc Af Amer: 40 mL/min — ABNORMAL LOW (ref >60)
GFR calc Af Amer: 51 mL/min — ABNORMAL LOW (ref >60)
GFR calc non Af Amer: 44 mL/min — ABNORMAL LOW (ref >60)
GFR, EST NON AFRICAN AMERICAN: 34 mL/min — AB (ref >60)
Glucose, Bld: 165 mg/dL — ABNORMAL HIGH (ref 65–99)
Glucose, Bld: 163 mg/dL — ABNORMAL HIGH (ref 65–99)
POTASSIUM: 3.3 mmol/L — AB (ref 3.5–5.1)
Potassium: 2.9 mmol/L — ABNORMAL LOW (ref 3.5–5.1)
SODIUM: 154 mmol/L — AB (ref 135–145)
Sodium: 153 mmol/L — ABNORMAL HIGH (ref 135–145)

## 2015-01-23 LAB — CBC
HEMATOCRIT: 49.5 % (ref 39.0–52.0)
HEMOGLOBIN: 14.9 g/dL (ref 13.0–17.0)
MCH: 29.3 pg (ref 26.0–34.0)
MCHC: 30.1 g/dL (ref 30.0–36.0)
MCV: 97.2 fL (ref 78.0–100.0)
Platelets: 191 10*3/uL (ref 150–400)
RBC: 5.09 MIL/uL (ref 4.22–5.81)
RDW: 16.5 % — ABNORMAL HIGH (ref 11.5–15.5)
WBC: 12 10*3/uL — AB (ref 4.0–10.5)

## 2015-01-23 LAB — GLUCOSE, CAPILLARY
GLUCOSE-CAPILLARY: 128 mg/dL — AB (ref 65–99)
Glucose-Capillary: 166 mg/dL — ABNORMAL HIGH (ref 65–99)
Glucose-Capillary: 178 mg/dL — ABNORMAL HIGH (ref 65–99)
Glucose-Capillary: 194 mg/dL — ABNORMAL HIGH (ref 65–99)
Glucose-Capillary: 163 mg/dL — ABNORMAL HIGH (ref 65–99)

## 2015-01-23 LAB — PHOSPHORUS: Phosphorus: 3.3 mg/dL (ref 2.5–4.6)

## 2015-01-23 LAB — MAGNESIUM: Magnesium: 3.2 mg/dL — ABNORMAL HIGH (ref 1.7–2.4)

## 2015-01-23 MED ORDER — POTASSIUM CHLORIDE 10 MEQ/100ML IV SOLN
10.0000 meq | INTRAVENOUS | Status: AC
  Administered 2015-01-23 (×4): 10 meq via INTRAVENOUS
  Filled 2015-01-23 (×4): qty 100

## 2015-01-23 NOTE — Procedures (Signed)
Extubation Procedure Note  Patient Details:   Name: [name] DOB: 02-Mar-1933 MRN: [id number]   Airway Documentation:  Airway 8 mm (Active)  Secured at (cm) 22 cm 01/23/2015  8:06 AM  Measured From Lips 01/23/2015  8:06 AM  Secured Location Center 01/23/2015  8:06 AM  Secured By [employer] 01/23/2015  8:06 AM  Tube Holder Repositioned Yes 01/23/2015  8:06 AM  Cuff Pressure (cm H2O) 22 cm H2O 01/23/2015  8:06 AM  Site Condition Dry 01/23/2015  8:06 AM    Evaluation  O2 sats: stable throughout Complications: No apparent complications Patient did tolerate procedure well. Bilateral Breath Sounds: Diminished Suctioning: Oral, Airway Yes  Renae Fickle 01/23/2015, 12:23 PM Extubated to 4lpm , sats 97% no distress noted.

## 2015-01-23 NOTE — Progress Notes (Addendum)
PULMONARY / CRITICAL CARE MEDICINE   Name: [name] MRN: [id number] DOB: Aug 04, 1932    ADMISSION DATE:  01/09/2015 CONSULTATION DATE:  01/10/15  REFERRING MD :  Dr. Gwenlyn Perking   CHIEF COMPLAINT:  AMS, Acute Respiratory Failure   INITIAL PRESENTATION: 81 y/o M with with recent (6/17) arteriography of RLE for venous ulcers who was admitted 6/18 with a 2 week hx of SOB.  Admitted per [facility] for further evaluation.  Treated with BiPAP but failed therapy and required intubation am of 6/19.  PCCM consulted for ICU transfer.    STUDIES:  6/18  CT ABD >> post-op changes in L groin, increased density in bladder, bibasilar atx 6/18  ECHO >> LVEF 55-60%, grade II diastolic dysfunction, moderate AS (wosening since last ECHO) 6/20 EEG>>>metabolic vs drug affect, no focus  6/19  CT of Head >> Stable moderate diffuse cerebral atrophy and mild chronic small vessel white matter ischemic changes 6/20 Echo: Left ventricle: The cavity size was normal. Wall thickness was normal. Systolic function was normal. The estimated ejection fraction was in the range of 60% to 65%. Wall motion was normal;there were no regional wall motion abnormalities. 6/22: MR Brain: No infarct, age related atrophy, mild chronic small vessel disease  SIGNIFICANT EVENTS: 6/17  RLE arteriography per Dr. Edilia Bo for PVD and non-healing R toe wound 6/18  Admit to [facility] with complaints of SOB x 2 weeks 6/19  Decompensated early am, required intubation / ICU transfer 6/20-trop elevation, hep , asa 6/21: polymyoclonus with when precedex stopped 6/22: continued plt count drop to 93 down from 131,agitation required change in sedation to prop 6/23: worsening chest film, plt stable 7/2>> staph bacteremia   SUBJECTIVE:  Awake,calm  VITAL SIGNS: Temp:  [98.4 F (36.9 C)-100 F (37.8 C)] 98.5 F (36.9 C) (07/02 0822) Pulse Rate:  [26-94] 26 (07/02 0600) Resp:  [13-18] 16 (07/02 0600) BP: (114-158)/(48-64) 134/48 mmHg (07/02 0806) SpO2:   [95 %-99 %] 97 % (07/02 0600) FiO2 (%):  [40 %] 40 % (07/02 0806) Weight:  [229 lb 4.5 oz (104 kg)] 229 lb 4.5 oz (104 kg) (07/02 0418)   HEMODYNAMICS:     VENTILATOR SETTINGS: Vent Mode:  [-] PSV FiO2 (%):  [40 %] 40 % Set Rate:  [12 bmp] 12 bmp Vt Set:  [560 mL] 560 mL PEEP:  [5 cmH20] 5 cmH20 Pressure Support:  [14 cmH20] 14 cmH20 Plateau Pressure:  [15 cmH20-19 cmH20] 19 cmH20   INTAKE / OUTPUT:  Intake/Output Summary (Last 24 hours) at 01/23/15 1159 Last data filed at 01/23/15 0600  Gross per 24 hour  Intake   3850 ml  Output   2565 ml  Net   1285 ml    PHYSICAL EXAMINATION: General:  Obese male, NAD  Neuro: awake, alert, nods  HEENT:  ETT in place Cardiovascular:  s1s2 rrr, 3/6 harsh systolic murmur Lungs: resps even non labored on full support, B rhonchi, no wheeze Abdomen:  Obese/soft, + BS Musculoskeletal:  No acute deformities  Skin:  Warm/dry, gen1+ BLE edema  LABS:  CBC  Recent Labs Lab 01/20/15 0512 01/21/15 0231 01/23/15 0219  WBC 10.9* 11.5* 12.0*  HGB 16.3 15.5 14.9  HCT 52.8* 51.1 49.5  PLT 227 211 191   Coag's No results for input(s): APTT, INR in the last 168 hours.   BMET  Recent Labs Lab 01/22/15 0222 01/22/15 1215 01/23/15 0219  NA 154* 158* 154*  K 2.7* 3.2* 2.9*  CL 108 111 110  CO2 34*  35* 34*  BUN 127* 114* 98*  CREATININE 2.31* 2.24* 1.80*  GLUCOSE 216* 208* 165*   Electrolytes  Recent Labs Lab 01/20/15 0512 01/21/15 0231 01/22/15 0222 01/22/15 1215 01/23/15 0219  CALCIUM 9.7 9.4 9.3 9.4 9.1  MG 3.4* 3.4*  --   --  3.2*  PHOS 2.7 4.7*  --   --  3.3   Sepsis Markers  Recent Labs Lab 01/19/15 1830  LATICACIDVEN 1.7   ABG No results for input(s): PHART, PCO2ART, PO2ART in the last 168 hours. Liver Enzymes  Recent Labs Lab 01/19/15 1830  AST 76*  ALT 34  ALKPHOS 51  BILITOT 0.7  ALBUMIN 3.1*   Cardiac Enzymes  Recent Labs Lab 01/19/15 2041 01/20/15 0512 01/20/15 1435  TROPONINI 0.49*  0.54* 0.42*   Glucose  Recent Labs Lab 01/22/15 1126 01/22/15 1552 01/22/15 1952 01/22/15 2325 01/23/15 0344 01/23/15 0823  GLUCAP 236* 124* 169* 210* 166* 178*    Imaging Dg Chest Port 1 View  01/23/2015   CLINICAL DATA:  Acute respiratory failure  EXAM: PORTABLE CHEST - 1 VIEW  COMPARISON:  01/21/2015  FINDINGS: Endotracheal tube is 5-6 cm above the carina, slightly retracted since prior study. NG tube enters the stomach. Bibasilar airspace opacities are noted, left greater than right, likely atelectasis. No significant change since prior study. Heart is upper limits normal in size. No significant visible effusions.  IMPRESSION: Continued bibasilar opacities, likely atelectasis, left greater than right.   Electronically Signed   By: Charlett Nose M.D.   On: 01/23/2015 08:35     ASSESSMENT / PLAN:  PULMONARY OETT 6/19 >>> A: Acute Respiratory Failure  Likely pulm edema  From AS, ischemia Bilateral diffuse patchy opacification, improved Never Smoker  OSA  P:   Daily SBT with PS as able  PRN BD  F/u CXR  Hold further diuresis as below    CARDIOVASCULAR CVL L IJ 6/19 >>  A:  Hypotension - cardiogenic shock, improved Aortic Stenosis - worsening of valvular disease, d/w cards At high risk for endocarditis, presumed AV endocarditis  CAD s/p PCI (remote) PVD s/p Arteriography 6/17 per Dr. Edilia Bo  NSTEMI P:  Heparin gtt stopped, converted to heparin sq possible L cath / PCI in future if other issues stabilize ASA, Plavix  aggressively diuresed as above, lasix on hold.  appreciate Dr York Spaniel assistance, agree that he will likely need to receive full course abx for ?endocarditis  RENAL A:   Acute on Chronic Kidney Injury, worsened some w diuresis Hypernatremia, exacerbated by post-ATN auto-diuresis Hypokalemia  P:   Cont d5w  follow BMP and I/O off lasix Replete K  F/u chem   GASTROINTESTINAL / GU  A:   GERD BPH - difficult to place  catheter Constipation-kub 6/22 no obstruction, improved 6/26 Emesis, resolved P:   Cont TF, tolerating better   HEMATOLOGIC A:  DVt prevention Thrombocytopenia (dilutional), noted low in past P:  DVT prophylaxis: heparin subcut  INFECTIOUS A:   Bibasilar Airspace Disease - possible HCAP but infiltrates clearing w diuresis Recent UTI Coag neg staph bacteremia  At HIGH risk for AV endocarditis P:   BCx2 6/19 >> No blood cx collected  Sputum 6/19 >> Likely OPF UC 6/19 >>Pseudomonas Aeruginosa 20 K (doubt pathogen) resp 6/28 >> normal flora Blood 6/28 >> GPC >> coag neg staph 2/2  Urine 6/28 >> negative  Ceftriaxone>>>6/22 >> 12/28/23 Vanco, start date 6/19>>>6/22; 6/29 >>  Zosyn, start date 6/19>>>6/22; 6/29 >> 7/1  At high risk  for endocarditis. Not clear that repeat TTE or TEE will change management at this point.  Cont vanco - likely needs 4-6 weeks rx  (ideally needs TEE but see discussion below, cont treat for now)    ENDOCRINE A:   Controlled Hyperglycemia  P:   Continue SSI Q4 blood glucose checks  NEUROLOGIC A:  Acute Encephalopathy- MR brain 6/22 negative for infarct; resolved 7/2 Chronic facial twitch P:   RASS goal: -1 to 0 Fentanyl prn Restart precedex if needed, will try to avoid   FAMILY  - Updates:  Son updated 7/2.  See discussion below per Dr. Tyson Alias   - Inter-disciplinary family meet or Palliative Care meeting due by: 6/26 done by DF and repeated 7/2    Mason Dress, NP 01/23/2015  11:59 AM Pager: [phone] or [phone]  STAFF NOTE: I, Rory Percy, MD FACP have personally reviewed patient's available data, including medical history, events of note, physical examination and test results as part of my evaluation. I have discussed with resident/NP and other care providers such as pharmacist, RN and RRT. In addition, I personally evaluated patient and elicited key findings of:  Wide awake, the calmest he has been, i had  extensive conversation with pt and family at bedside, 2 kids, he has decided against any reintubation and no trach, continued medical care if does ok with extubation, ABX to continued, TEE he would not want, treat empiric endocarditis, likely 4 weeks, to chair position, pcxr best has been and no further delirium, they will call son in [place] also, weaned cpap5 ps down to 5 from 12 to TV 330, rate 32-35, have maximized efforts, his spont Tv is excellent 1 liter The patient is critically ill with multiple organ systems failure and requires high complexity decision making for assessment and support, frequent evaluation and titration of therapies, application of advanced monitoring technologies and extensive interpretation of multiple databases.   Critical Care Time devoted to patient care services described in this note is 30 Minutes. This time reflects time of care of this signee: Rory Percy, MD FACP. This critical care time does not reflect procedure time, or teaching time or supervisory time of PA/NP/Med student/Med Resident etc but could involve care discussion time. Rest per NP/medical resident whose note is outlined above and that I agree with   Mcarthur Rossetti. Tyson Alias, MD, FACP Pgr: [phone] [facility] 01/23/2015 12:07 PM

## 2015-01-23 NOTE — Progress Notes (Signed)
eLink Physician-Brief Progress Note Patient Name: Roi Hjort DOB: [birth year] MRN: [id number]   Date of Service  01/23/2015  HPI/Events of Note    eICU Interventions  Hypokalemia -repleted      Intervention Category Intermediate Interventions: Electrolyte abnormality - evaluation and management  ALVA,RAKESH V. 01/23/2015, 3:45 AM

## 2015-01-23 NOTE — Progress Notes (Signed)
Patient ID: [name], male   DOB: [birth year], 81 y.o.   MRN: [id number] I have had extensive discussions with family and pt. We discussed patients current circumstances and organ failures. We also discussed patient's prior wishes under circumstances such as this. Family has decided to NOT perform resuscitation if arrest but to continue current medical support for now. If fail then comfort. Ill attempt to notify cards. We are extubating for success, has  Weaned well with PS to 5. This is the best window for success, but he does not want trach.  Comfort if fails  Mcarthur Rossetti. Tyson Alias, MD, FACP Pgr: [phone] [place]

## 2015-01-23 NOTE — Progress Notes (Signed)
Discussed case with Dr. Tyson Alias today and reviewed Dr. York Spaniel notes. [name] has been intubated for 2 weeks and does not want a trach. He is being treated for presumed AV endocarditis with a 4 week course of antibiotics. Plan for possible cath when stable - unclear if he would be a TAVR candidate due to endocarditis. He would likely be at increased risk for traditional AVR. Cardiology will be available as needed over the weekend. Please call with questions.  Chrystie Nose, MD, [facility] Attending Cardiologist [facility]

## 2015-01-23 NOTE — Plan of Care (Signed)
Problem: Consults Goal: Respiratory Problems Patient Education See Patient Education Module for education specifics.  Outcome: Progressing Patient extubated.

## 2015-01-24 ENCOUNTER — Inpatient Hospital Stay (HOSPITAL_COMMUNITY): Payer: Medicare Other

## 2015-01-24 DIAGNOSIS — L89899 Pressure ulcer of other site, unspecified stage: Secondary | ICD-10-CM

## 2015-01-24 DIAGNOSIS — N189 Chronic kidney disease, unspecified: Secondary | ICD-10-CM

## 2015-01-24 DIAGNOSIS — I252 Old myocardial infarction: Secondary | ICD-10-CM

## 2015-01-24 DIAGNOSIS — Z95818 Presence of other cardiac implants and grafts: Secondary | ICD-10-CM

## 2015-01-24 DIAGNOSIS — I350 Nonrheumatic aortic (valve) stenosis: Secondary | ICD-10-CM

## 2015-01-24 DIAGNOSIS — E1122 Type 2 diabetes mellitus with diabetic chronic kidney disease: Secondary | ICD-10-CM

## 2015-01-24 DIAGNOSIS — B957 Other staphylococcus as the cause of diseases classified elsewhere: Secondary | ICD-10-CM

## 2015-01-24 LAB — CBC
HCT: 47.5 % (ref 39.0–52.0)
Hemoglobin: 14.5 g/dL (ref 13.0–17.0)
MCH: 29.5 pg (ref 26.0–34.0)
MCHC: 30.5 g/dL (ref 30.0–36.0)
MCV: 96.5 fL (ref 78.0–100.0)
Platelets: 174 10*3/uL (ref 150–400)
RBC: 4.92 MIL/uL (ref 4.22–5.81)
RDW: 15.9 % — AB (ref 11.5–15.5)
WBC: 9.7 10*3/uL (ref 4.0–10.5)

## 2015-01-24 LAB — CULTURE, BLOOD (ROUTINE X 2)

## 2015-01-24 LAB — GLUCOSE, CAPILLARY
GLUCOSE-CAPILLARY: 134 mg/dL — AB (ref 65–99)
Glucose-Capillary: 129 mg/dL — ABNORMAL HIGH (ref 65–99)
Glucose-Capillary: 137 mg/dL — ABNORMAL HIGH (ref 65–99)
Glucose-Capillary: 139 mg/dL — ABNORMAL HIGH (ref 65–99)
Glucose-Capillary: 139 mg/dL — ABNORMAL HIGH (ref 65–99)
Glucose-Capillary: 146 mg/dL — ABNORMAL HIGH (ref 65–99)

## 2015-01-24 LAB — BASIC METABOLIC PANEL
ANION GAP: 10 (ref 5–15)
BUN: 59 mg/dL — ABNORMAL HIGH (ref 6–20)
CO2: 34 mmol/L — ABNORMAL HIGH (ref 22–32)
CREATININE: 1.34 mg/dL — AB (ref 0.61–1.24)
Calcium: 9.7 mg/dL (ref 8.9–10.3)
Chloride: 111 mmol/L (ref 101–111)
GFR calc Af Amer: 56 mL/min — ABNORMAL LOW (ref >60)
GFR calc non Af Amer: 49 mL/min — ABNORMAL LOW (ref >60)
Glucose, Bld: 146 mg/dL — ABNORMAL HIGH (ref 65–99)
Potassium: 3.2 mmol/L — ABNORMAL LOW (ref 3.5–5.1)
Sodium: 155 mmol/L — ABNORMAL HIGH (ref 135–145)

## 2015-01-24 MED ORDER — HYDRALAZINE HCL 20 MG/ML IJ SOLN: 10.0000 mg | Freq: Four times a day (QID) | INTRAMUSCULAR | Status: DC | PRN

## 2015-01-24 MED ORDER — ASPIRIN 300 MG RE SUPP
150.0000 mg | Freq: Every day | RECTAL | Status: DC
  Administered 2015-01-24 – 2015-01-26 (×2): 150 mg via RECTAL
  Filled 2015-01-24 (×3): qty 1

## 2015-01-24 MED ORDER — SODIUM CHLORIDE 0.9 % IV SOLN
1500.0000 mg | INTRAVENOUS | Status: DC
  Administered 2015-01-25 – 2015-01-27 (×3): 1500 mg via INTRAVENOUS
  Filled 2015-01-24 (×5): qty 1500

## 2015-01-24 MED ORDER — POTASSIUM CHLORIDE 10 MEQ/100ML IV SOLN
10.0000 meq | INTRAVENOUS | Status: AC
  Administered 2015-01-24 (×2): 10 meq via INTRAVENOUS
  Filled 2015-01-24 (×2): qty 100

## 2015-01-24 MED ORDER — DEXTROSE 5 % IV SOLN
INTRAVENOUS | Status: DC
  Administered 2015-01-24 – 2015-01-25 (×2): via INTRAVENOUS

## 2015-01-24 MED ORDER — DEXTROSE-NACL 5-0.45 % IV SOLN: INTRAVENOUS | Status: DC

## 2015-01-24 MED ORDER — PANTOPRAZOLE SODIUM 40 MG IV SOLR
40.0000 mg | INTRAVENOUS | Status: DC
  Administered 2015-01-24: 40 mg via INTRAVENOUS
  Filled 2015-01-24 (×2): qty 40

## 2015-01-24 NOTE — Progress Notes (Signed)
New Admission Note: Pt transferred to the unit from 39M via bed  Arrival Method: Via bed Mental Orientation: Alert and oriented x 2. Disoriented to situation and time Telemetry: N/A Assessment: To be Completed Skin: Ecchymosis to bilateral upper extremities, venous stasis bilateral LE's, Pressure/Moisture associated damage to bilateral buttocks IV: D 5 @ 75 ml/hr Pain: Denies Tubes: Foley, Flexi-seal, IV tubing Safety Measures: Safety Fall Prevention Plan has been discussed  Admission:  To be completed 6 [place] Orientation: Patient has been orientated to the room, unit and staff.  Family: Not at the bedside  Orders have been reviewed and implemented. Will continue to monitor the patient. Call light has been placed within reach and bed alarm has been activated.   Burley Saver, RN-BC Phone: [phone]

## 2015-01-24 NOTE — Consult Note (Signed)
WOC wound consult note Reason for Consult: right foot wound. Pt has been followed by the wound care center for this. Pt does mention use of silver nitrate which would explain the black discoloration of the periwound and wound base.  Wound type: neuropathic ulcer vs. Callous.  Pt reports calleous and was debrided by MD.  Unclear, family at bedside not able to confirm  Pressure Ulcer POA: No Measurement: 0.5cm x 0.5cm x 0.1cm  Wound ZJQ:BHAL, dusky (prob from use of silver nitrate) Drainage (amount, consistency, odor) minimal, non purulent  Periwound:intact  Dressing procedure/placement/frequency:  silver hydrofiber for bioburdan, cover with foam. Change every other day.  Pt to follow back up in the wound care center of his choice at [place].  Discussed POC with patient and bedside nurse.  Re consult if needed, will not follow at this time. Thanks  Libia Fazzini [employer], CWOCN [phone])

## 2015-01-24 NOTE — Evaluation (Signed)
Clinical/Bedside Swallow Evaluation Patient Details  Name: [name] MRN: [id number] Date of Birth: [birth year]  Today's Date: 01/24/2015 Time: SLP Start Time (ACUTE ONLY): 1325 SLP Stop Time (ACUTE ONLY): 1344 SLP Time Calculation (min) (ACUTE ONLY): 19 min  Past Medical History:  Past Medical History  Diagnosis Date  . Gout, unspecified     severe dz, "treatment done that last 15 years"  . Obesity (BMI 30-39.9)   . Restless leg syndrome   . History of retinal detachment 1994  . Hypertensive heart disease   . CAD (coronary artery disease), native coronary artery   . History of pericarditis 2007    MSSA, s/p pericardial window  . GERD   . Osteoarthritis   . Sleep apnea in adult     CPAP qhs  . TIA (transient ischemic attack) 2005  . BPH (benign prostatic hypertrophy)     "minor"  . Lumbar spinal stenosis   . Carotid artery occlusion   . Unspecified venous (peripheral) insufficiency   . Stroke May 2003  . Anxiety   . Depression   . Aortic stenosis     ECHO 05/29/14 moderate AS Mean gradient 21 mm EF 55% ECHO 07/09/13  Mean gradient 12 mm Mild to moderate AS EF 50%    . CAD (coronary artery disease)     02/11/09  Xience stent to circumflex 2.5 x 18 mm postdilated to 2.75 mm  Cath sept 2011  normal Left main, 20 % stenosis proximal LAD, 40% stenosis mid LAD, 70% stenosis proximal Diag 1, 90% stenosis prox CFX, 90% first OM, small and nondominant RCA;   . Carotid artery disease     Prior TIA 2006 with left CEA by Dr. Arbie Cookey Recurrent TIA in April 2011   . Type 2 diabetes mellitus with vascular disease   . Lumbar disc disease   . CKD (chronic kidney disease), stage III 02/08/2014  . Hypertension   . CHF (congestive heart failure)   . Renal insufficiency    Past Surgical History:  Past Surgical History  Procedure Laterality Date  . Pericardial window  2007  . Left arm  2008    shoulder  . Right arm  1970's    shoulder  . Cataract extraction      Left side x's 2 and  right  . Retinal detachment surgery      left side  . Carotid endarterectomy Left 12-06-05    cea  . Coronary angioplasty  01/2009    x 1 stent  . Colonoscopy    . Carpal tunnel release Bilateral   . Back surgery  2013    removed bone spurs  . Inguinal hernia repair Left 10/01/2013    Procedure: HERNIA REPAIR INGUINAL ADULT;  Surgeon: Axel Filler, MD;  Location: [facility] ORS;  Service: General;  Laterality: Left;  . Insertion of mesh Left 10/01/2013    Procedure: INSERTION OF MESH;  Surgeon: Axel Filler, MD;  Location: [facility] ORS;  Service: General;  Laterality: Left;  . Finger amputation  May 2015    JUST TO FIRST JOINT RIGHT HAND  LAST FINGER ( Right 5th finger)  . Peripheral vascular catheterization N/A 01/08/2015    Procedure: Abdominal Aortogram;  Surgeon: Chuck Hint, MD;  Location: [facility] INVASIVE CV LAB;  Service: Cardiovascular;  Laterality: N/A;   HPI:  [name] is a 81 y.o. male with pmh of CAD,GERD, TIA, anxiety, depression,  DM, CKD, AS, he also has a right foot ulcer for the last few  months for which he is getting outpatient wound care, Patient presents to the ED with c/o intermittent, ongoing, and unchanged SOB x2 weeks, possible heart failure with pulmonary edema, possible HCAP. No acute abnormalities noted on initial CXR. Also developed non-STEMI. Tried on Bipap initially however continued to worsen and was intubated 6/19 -7/2. Neurology consulted following decline due to noted jerking lingual movements however head CT negative.    Assessment / Plan / Recommendation Clinical Impression  Patient presents with a suspected acute reversible dysphagia s/p prolonged (14 day) intubation characterized by aphonia, weak cough response, and generalized weakness, all raising concern for decreased glottal closure and sensory deficits which may impact ability to protect the airway. Instrumental evaluation recommended prior to placement on a po diet given risk of aspiration. Patient  may have ice chips after oral care to facilitate use of swallowing musculature, hydration of mucosa, and pleasure. Education complete with patient, family, and Charity fundraiser. Will f/u for MBS in am 7/4.     Aspiration Risk  Severe    Diet Recommendation Ice chips PRN after oral care;NPO   Medication Administration: Via alternative means    Other  Recommendations Oral Care Recommendations: Oral care QID (prior to ice chips)   Follow Up Recommendations       Frequency and Duration        Pertinent Vitals/Pain n/a        Swallow Study Prior Functional Status  Type of Home: House Available Help at Discharge: Family;Available PRN/intermittently    General Other Pertinent Information: [name] is a 81 y.o. male with pmh of CAD,GERD, TIA, anxiety, depression,  DM, CKD, AS, he also has a right foot ulcer for the last few months for which he is getting outpatient wound care, Patient presents to the ED with c/o intermittent, ongoing, and unchanged SOB x2 weeks, possible heart failure with pulmonary edema, possible HCAP. No acute abnormalities noted on initial CXR. Also developed non-STEMI. Tried on Bipap initially however continued to worsen and was intubated 6/19 -7/2. Neurology consulted following decline due to noted jerking lingual movements however head CT negative.  Type of Study: Bedside swallow evaluation Previous Swallow Assessment: none Diet Prior to this Study: NPO Temperature Spikes Noted: No Respiratory Status: Supplemental O2 delivered via (comment) (nasal cannula) History of Recent Intubation: Yes Length of Intubations (days): 14 days Date extubated: 01/23/15 Behavior/Cognition: Alert;Cooperative;Pleasant mood Oral Cavity - Dentition: Missing dentition;Poor condition Self-Feeding Abilities: Able to feed self;Needs assist Patient Positioning: Upright in bed Baseline Vocal Quality: Aphonic Volitional Cough: Weak Volitional Swallow: Able to elicit    Oral/Motor/Sensory  Function Overall Oral Motor/Sensory Function: Appears within functional limits for tasks assessed   Ice Chips Ice chips: Within functional limits Presentation: Spoon   Thin Liquid Thin Liquid: Impaired Presentation: Cup;Self Fed (with HOH assist) Pharyngeal  Phase Impairments: Multiple swallows;Wet Vocal Quality (subtle wet vocal quality)    Nectar Thick Nectar Thick Liquid: Not tested   Honey Thick Honey Thick Liquid: Not tested   Puree Puree: Within functional limits Presentation: Spoon   Solid   GO   Kiyani Jernigan MA, CCC-SLP [phone]  Solid: Not tested       Dametrius Sanjuan Meryl 01/24/2015,1:47 PM

## 2015-01-24 NOTE — Evaluation (Signed)
Physical Therapy Evaluation Patient Details Name: [name] MRN: [id number] DOB: [birth year] Today's Date: 01/24/2015   History of Present Illness    81 y/o M with with recent (6/17) arteriography of RLE for venous ulcers who was admitted 6/18 with a 2 week hx of SOB. Admitted per [facility] for further evaluation. Treated with BiPAP but failed therapy and required intubation am of 6/19; extubated 7/2   Clinical Impression  Pt presents with severe limits to functional mobility related to prolonged bedrest/intubation and complex medical issues.  Able to sit EOB this eval, requires physical assist of 2 for basic mobility.  Per family, was previously independent, lived alone with son/dtr-in-law next door. Recommend c/s CIR for potential admission (order for screen placed); if pt is not CIR candidate or declines CIR, dtr-in-law works at [employer] and would prefer postacute care there if bed available.  Will initiate PT in acute setting, recommend nursing staff use lift for OOB needs, assist with dangle EOB 2-3x/day as able.  Thank you.    Follow Up Recommendations CIR (daughter in law works at [employer])    Furniture conservator/restorer  None recommended by PT    Recommendations for [facility] consult     Precautions / Restrictions Precautions Precautions: Fall Precaution Comments: bed alarm,       Mobility  Bed Mobility Overal bed mobility: Needs Assistance;+2 for physical assistance Bed Mobility: Rolling;Supine to Sit;Sit to Supine Rolling: Mod assist   Supine to sit: Mod assist;+2 for physical assistance;HOB elevated Sit to supine: Max assist;+2 for physical assistance   General bed mobility comments: HOB >45 degree to sit up, pt struggles to understand commands, then impulsively activates trunk toward sitting; HOB 0 to return to sup, assist legs back to surface and support trunk to supine; repeated/multi-modal cues to roll to right for off-load positioning  Transfers Overall  transfer level:  (did not attempt)                  Ambulation/Gait                Stairs            Wheelchair Mobility    Modified Rankin (Stroke Patients Only)       Balance Overall balance assessment: Needs assistance Sitting-balance support: No upper extremity supported;Feet supported Sitting balance-Leahy Scale: Poor Sitting balance - Comments: delayed trunk initiation, slow LOB to rear, to left, cues to sit up, to reach help initiate.  sits EOB 5-6 mintues with evidence of visual impairment as poor target reaching (hx retinal detachment) Postural control: Posterior lean;Left lateral lean (seated) Standing balance support:  (did not)                                 Pertinent Vitals/Pain Pain Assessment: Faces Faces Pain Scale: Hurts even more Pain Location: back Pain Intervention(s): Limited activity within patient's tolerance;Monitored during session;Repositioned    Home Living Family/patient expects to be discharged to:: Private residence Living Arrangements: Alone Available Help at Discharge: Family;Available PRN/intermittently Type of Home: House Home Access: Ramped entrance     Home Layout: One level Home Equipment: Walker - 2 wheels;Cane - single point;Hospital bed;Bedside commode;Tub bench Additional Comments: pt states equipment was left over from his wife     Prior Function Level of Independence: Independent (per family)         Comments: lives alone, son next door; yard work, driving, "he's  very independent"     Hand Dominance   Dominant Hand: Right    Extremity/Trunk Assessment   Upper Extremity Assessment: Generalized weakness (limited to <90 flexion at shldr, poor grip)           Lower Extremity Assessment: Generalized weakness (Right ant patellar ganglion cyst (appears); Part ROM bilat)         Communication   Communication:  (s/p extubation)  Cognition Arousal/Alertness: Lethargic Behavior  During Therapy: Flat affect Overall Cognitive Status: Impaired/Different from baseline Area of Impairment: Attention;Following commands   Current Attention Level: Sustained   Following Commands: Follows one step commands with increased time       General Comments: slow to process commmands, able to answer orientation questions with some lack of awareness of date, but able to state July.  Hungry    General Comments General comments (skin integrity, edema, etc.): multiple skin tears legs, prophylactic dressings for pressure ulcer prevention; apparent ganglion cyst at right anterior knee    Exercises        Assessment/Plan    PT Assessment Patient needs continued PT services  PT Diagnosis Generalized weakness   PT Problem List Pain;Decreased skin integrity;Obesity;Cardiopulmonary status limiting activity;Decreased safety awareness;Decreased knowledge of use of DME;Decreased cognition;Decreased coordination;Decreased mobility;Decreased balance;Decreased activity tolerance;Decreased strength;Decreased range of motion  PT Treatment Interventions Patient/family education;Cognitive remediation;Neuromuscular re-education;Balance training;Therapeutic exercise;Therapeutic activities;Functional mobility training;Gait training;DME instruction   PT Goals (Current goals can be found in the Care Plan section) Acute Rehab PT Goals Patient Stated Goal: eat PT Goal Formulation: Patient unable to participate in goal setting Time For Goal Achievement: 02/07/15 Potential to Achieve Goals: Fair    Frequency Min 3X/week   Barriers to discharge Decreased caregiver support son lives next door, pt need to be at supervision level to dc home    Co-evaluation               End of Session   Activity Tolerance: Patient limited by fatigue;Patient limited by pain Patient left: in bed;with call bell/phone within reach;with bed alarm set;with family/visitor present Nurse Communication: Mobility  status         Time: 9604-5409 PT Time Calculation (min) (ACUTE ONLY): 36 min   Charges:   PT Evaluation $Initial PT Evaluation Tier I: 1 Procedure PT Treatments $Therapeutic Activity: 8-22 mins   PT G Codes:        Dennis Bast 01/24/2015, 1:07 PM

## 2015-01-24 NOTE — Consult Note (Signed)
[facility] for Infectious Disease  Date of Admission:  01/09/2015  Date of Consult:  01/24/2015  Reason for Consult: Bacteremia, Coag Neg Staph/MRSE Referring Physician: Candiss Norse  Impression/Recommendation MRSE bacteremia AS DM- pt denies.  CKD CAD- NSTEMI, prev stent R foot pressure ulcer  Would Recheck his BCx Not get TEE Treat him with vanco for 1 month  Comment- He is not a good candidate for a TEE due to potential resp failure and his wishes to not be re-intubated. Given this, his AS, his bacteremia, will treat him for a longer course. Discussed with family.  Will need PIC.   Thank you so much for this interesting consult,   Bobby Rumpf (pager) [phone] [email or web address]  [name] is an 81 y.o. male.  HPI: 81 yo M with hx of AS (mod-severe), DM, CKD/ESRD, CAD stent 9-11, staph pericarditis 2007. Adm on 6-18 with worsening SOB, DOE as well as abd pain. He had angiogram day pta. IN hospital he became confused and agitated and required inbx on 6-19 and neo drip. He was started on vanco/zosyn at that time for concern over HCAP. His course was further complicated by + troponin, up to 17 (started on heparin 6-19).  His Cx from adm showed only UCx 20k pseudomonas. He was narrowed to ceftriaxone on 6-22, plan for 7 days total anbx.   He began to have fever on 6-29, 102.7. He was restarted vanco/zosyn 6-29. His BCx have grown 2/2 MRSE.  He has been followed by CV who does not feel that he needs TEE. By 7-2 pt and family agreed to extubation, he did well afterwards and has now been transferred to floor.   Past Medical History  Diagnosis Date  . Gout, unspecified     severe dz, "treatment done that last 15 years"  . Obesity (BMI 30-39.9)   . Restless leg syndrome   . History of retinal detachment 1994  . Hypertensive heart disease   . CAD (coronary artery disease), native coronary artery   . History of pericarditis 2007    MSSA, s/p pericardial window    . GERD   . Osteoarthritis   . Sleep apnea in adult     CPAP qhs  . TIA (transient ischemic attack) 2005  . BPH (benign prostatic hypertrophy)     "minor"  . Lumbar spinal stenosis   . Carotid artery occlusion   . Unspecified venous (peripheral) insufficiency   . Stroke May 2003  . Anxiety   . Depression   . Aortic stenosis     ECHO 05/29/14 moderate AS Mean gradient 21 mm EF 55% ECHO 07/09/13  Mean gradient 12 mm Mild to moderate AS EF 50%    . CAD (coronary artery disease)     02/11/09  Xience stent to circumflex 2.5 x 18 mm postdilated to 2.75 mm  Cath sept 2011  normal Left main, 20 % stenosis proximal LAD, 40% stenosis mid LAD, 70% stenosis proximal Diag 1, 90% stenosis prox CFX, 90% first OM, small and nondominant RCA;   . Carotid artery disease     Prior TIA 2006 with left CEA by Dr. Donnetta Hutching Recurrent TIA in April 2011   . Type 2 diabetes mellitus with vascular disease   . Lumbar disc disease   . CKD (chronic kidney disease), stage III 02/08/2014  . Hypertension   . CHF (congestive heart failure)   . Renal insufficiency     Past Surgical History  Procedure Laterality Date  . Pericardial window  2007  . Left arm  2008    shoulder  . Right arm  1970's    shoulder  . Cataract extraction      Left side x's 2 and right  . Retinal detachment surgery      left side  . Carotid endarterectomy Left 12-06-05    cea  . Coronary angioplasty  01/2009    x 1 stent  . Colonoscopy    . Carpal tunnel release Bilateral   . Back surgery  2013    removed bone spurs  . Inguinal hernia repair Left 10/01/2013    Procedure: HERNIA REPAIR INGUINAL ADULT;  Surgeon: Ralene Ok, MD;  Location: [facility];  Service: General;  Laterality: Left;  . Insertion of mesh Left 10/01/2013    Procedure: INSERTION OF MESH;  Surgeon: Ralene Ok, MD;  Location: [facility];  Service: General;  Laterality: Left;  . Finger amputation  May 2015    JUST TO FIRST JOINT RIGHT HAND  LAST FINGER ( Right 5th finger)   . Peripheral vascular catheterization N/A 01/08/2015    Procedure: Abdominal Aortogram;  Surgeon: Angelia Mould, MD;  Location: [facility];  Service: Cardiovascular;  Laterality: N/A;     Allergies  Allergen Reactions  . Statins Other (See Comments)    Severe leg myalgias, weakness    Medications:  Scheduled: . antiseptic oral rinse  7 mL Mouth Rinse QID  . aspirin  150 mg Rectal Daily  . chlorhexidine  15 mL Mouth Rinse BID  . clopidogrel  75 mg Per Tube Daily  . feeding supplement (PRO-STAT SUGAR FREE 64)  60 mL Per Tube BID  . feeding supplement (VITAL HIGH PROTEIN)  1,000 mL Per Tube Q24H  . heparin subcutaneous  5,000 Units Subcutaneous 3 times per day  . insulin aspart  0-20 Units Subcutaneous 6 times per day  . metoprolol tartrate  12.5 mg Oral BID  . pantoprazole (PROTONIX) IV  40 mg Intravenous Q24H  . potassium chloride  10 mEq Intravenous Q1 Hr x 4  . [START ON 01/25/2015] vancomycin  1,500 mg Intravenous Q24H    Abtx:  Anti-infectives    Start     Dose/Rate Route Frequency Ordered Stop   01/25/15 0600  vancomycin (VANCOCIN) 1,500 mg in sodium chloride 0.9 % 500 mL IVPB     1,500 mg 250 mL/hr over 120 Minutes Intravenous Every 24 hours 01/24/15 1132     01/22/15 0600  vancomycin (VANCOCIN) 1,250 mg in sodium chloride 0.9 % 250 mL IVPB  Status:  Discontinued     1,250 mg 166.7 mL/hr over 90 Minutes Intravenous Every 48 hours 01/20/15 0558 01/20/15 1131   01/22/15 0600  vancomycin (VANCOCIN) 1,500 mg in sodium chloride 0.9 % 500 mL IVPB  Status:  Discontinued     1,500 mg 250 mL/hr over 120 Minutes Intravenous Every 48 hours 01/20/15 1131 01/24/15 1132   01/20/15 1300  piperacillin-tazobactam (ZOSYN) IVPB 3.375 g  Status:  Discontinued     3.375 g 12.5 mL/hr over 240 Minutes Intravenous Every 8 hours 01/20/15 1259 01/22/15 1542   01/20/15 0630  vancomycin (VANCOCIN) 1,500 mg in sodium chloride 0.9 % 500 mL IVPB     1,500 mg 250 mL/hr over 120 Minutes  Intravenous  Once 01/20/15 0558 01/20/15 0828   01/13/15 1045  cefTRIAXone (ROCEPHIN) 1 g in dextrose 5 % 50 mL IVPB - Premix     1 g 100 mL/hr over 30 Minutes Intravenous Every 24  hours 01/13/15 1038 01/16/15 1126   01/11/15 1000  vancomycin (VANCOCIN) 1,250 mg in sodium chloride 0.9 % 250 mL IVPB  Status:  Discontinued     1,250 mg 166.7 mL/hr over 90 Minutes Intravenous Every 24 hours 01/10/15 0940 01/13/15 1038   01/10/15 1600  piperacillin-tazobactam (ZOSYN) IVPB 3.375 g  Status:  Discontinued     3.375 g 12.5 mL/hr over 240 Minutes Intravenous Every 8 hours 01/10/15 0940 01/13/15 1038   01/10/15 1000  vancomycin (VANCOCIN) 2,000 mg in sodium chloride 0.9 % 500 mL IVPB     2,000 mg 250 mL/hr over 120 Minutes Intravenous  Once 01/10/15 0927 01/10/15 1218   01/10/15 0930  piperacillin-tazobactam (ZOSYN) IVPB 3.375 g     3.375 g 100 mL/hr over 30 Minutes Intravenous  Once 01/10/15 0927 01/10/15 1048      Total days of antibiotics: 5 vanco          Social History:  reports that he has never smoked. He has never used smokeless tobacco. He reports that he does not drink alcohol or use illicit drugs.  Family History  Problem Relation Age of Onset  . Heart disease Mother     Before age 60  . Heart attack Mother   . Diabetes Father   . Heart disease Father   . Breast cancer Sister   . Cancer Sister   . Prostate cancer Brother   . Lung cancer Brother   . Cancer Brother   . Diabetes Daughter   . Diabetes Son   . Hypertension Son     General ROS: eating ice chips, stool bag on, foley in, see HPI.   Blood pressure 108/57, pulse 78, temperature 98.8 F (37.1 C), temperature source Oral, resp. rate 22, height '5\' 9"'  (1.753 m), weight 104 kg (229 lb 4.5 oz), SpO2 98 %. General appearance: alert, cooperative, no distress and slowed mentation Eyes: negative findings: EOMI, pupils unequal Throat: abnormal findings: dry, no thrush Neck: no adenopathy, no JVD and supple, symmetrical,  trachea midline Lungs: clear to auscultation bilaterally Heart: regular rate and rhythm and systolic murmur: early systolic 4/6, crescendo at 2nd left intercostal space Abdomen: normal findings: bowel sounds normal and soft, non-tender Extremities: edema none and R lateral foot wound- tender, no erythema, dried blood, no d/c. 2 peripheral IV RUE.    Results for orders placed or performed during the hospital encounter of 01/09/15 (from the past 48 hour(s))  Glucose, capillary     Status: Abnormal   Collection Time: 01/22/15  3:52 PM  Result Value Ref Range   Glucose-Capillary 124 (H) 65 - 99 mg/dL  Glucose, capillary     Status: Abnormal   Collection Time: 01/22/15  7:52 PM  Result Value Ref Range   Glucose-Capillary 169 (H) 65 - 99 mg/dL  Glucose, capillary     Status: Abnormal   Collection Time: 01/22/15 11:25 PM  Result Value Ref Range   Glucose-Capillary 210 (H) 65 - 99 mg/dL  Basic metabolic panel     Status: Abnormal   Collection Time: 01/23/15  2:19 AM  Result Value Ref Range   Sodium 154 (H) 135 - 145 mmol/L   Potassium 2.9 (L) 3.5 - 5.1 mmol/L   Chloride 110 101 - 111 mmol/L   CO2 34 (H) 22 - 32 mmol/L   Glucose, Bld 165 (H) 65 - 99 mg/dL   BUN 98 (H) 6 - 20 mg/dL   Creatinine, Ser 1.80 (H) 0.61 - 1.24 mg/dL  Calcium 9.1 8.9 - 10.3 mg/dL   GFR calc non Af Amer 34 (L) >60 mL/min   GFR calc Af Amer 40 (L) >60 mL/min    Comment: (NOTE) The eGFR has been calculated using the CKD EPI equation. This calculation has not been validated in all clinical situations. eGFR's persistently <60 mL/min signify possible Chronic Kidney Disease.    Anion gap 10 5 - 15  Magnesium     Status: Abnormal   Collection Time: 01/23/15  2:19 AM  Result Value Ref Range   Magnesium 3.2 (H) 1.7 - 2.4 mg/dL  Phosphorus     Status: None   Collection Time: 01/23/15  2:19 AM  Result Value Ref Range   Phosphorus 3.3 2.5 - 4.6 mg/dL  CBC     Status: Abnormal   Collection Time: 01/23/15  2:19 AM    Result Value Ref Range   WBC 12.0 (H) 4.0 - 10.5 K/uL   RBC 5.09 4.22 - 5.81 MIL/uL   Hemoglobin 14.9 13.0 - 17.0 g/dL   HCT 49.5 39.0 - 52.0 %   MCV 97.2 78.0 - 100.0 fL   MCH 29.3 26.0 - 34.0 pg   MCHC 30.1 30.0 - 36.0 g/dL   RDW 16.5 (H) 11.5 - 15.5 %   Platelets 191 150 - 400 K/uL  Glucose, capillary     Status: Abnormal   Collection Time: 01/23/15  3:44 AM  Result Value Ref Range   Glucose-Capillary 166 (H) 65 - 99 mg/dL  Glucose, capillary     Status: Abnormal   Collection Time: 01/23/15  8:23 AM  Result Value Ref Range   Glucose-Capillary 178 (H) 65 - 99 mg/dL  Glucose, capillary     Status: Abnormal   Collection Time: 01/23/15 12:13 PM  Result Value Ref Range   Glucose-Capillary 194 (H) 65 - 99 mg/dL  Glucose, capillary     Status: Abnormal   Collection Time: 01/23/15  4:24 PM  Result Value Ref Range   Glucose-Capillary 163 (H) 65 - 99 mg/dL  Basic metabolic panel     Status: Abnormal   Collection Time: 01/23/15  5:32 PM  Result Value Ref Range   Sodium 153 (H) 135 - 145 mmol/L   Potassium 3.3 (L) 3.5 - 5.1 mmol/L   Chloride 113 (H) 101 - 111 mmol/L   CO2 32 22 - 32 mmol/L   Glucose, Bld 163 (H) 65 - 99 mg/dL   BUN 79 (H) 6 - 20 mg/dL   Creatinine, Ser 1.46 (H) 0.61 - 1.24 mg/dL   Calcium 9.2 8.9 - 10.3 mg/dL   GFR calc non Af Amer 44 (L) >60 mL/min   GFR calc Af Amer 51 (L) >60 mL/min    Comment: (NOTE) The eGFR has been calculated using the CKD EPI equation. This calculation has not been validated in all clinical situations. eGFR's persistently <60 mL/min signify possible Chronic Kidney Disease.    Anion gap 8 5 - 15  Glucose, capillary     Status: Abnormal   Collection Time: 01/23/15  8:00 PM  Result Value Ref Range   Glucose-Capillary 128 (H) 65 - 99 mg/dL   Comment 1 Notify RN   Glucose, capillary     Status: Abnormal   Collection Time: 01/23/15 11:44 PM  Result Value Ref Range   Glucose-Capillary 146 (H) 65 - 99 mg/dL   Comment 1 Notify RN    Glucose, capillary     Status: Abnormal   Collection Time: 01/24/15  3:13  AM  Result Value Ref Range   Glucose-Capillary 139 (H) 65 - 99 mg/dL  Basic metabolic panel     Status: Abnormal   Collection Time: 01/24/15  5:28 AM  Result Value Ref Range   Sodium 155 (H) 135 - 145 mmol/L   Potassium 3.2 (L) 3.5 - 5.1 mmol/L   Chloride 111 101 - 111 mmol/L   CO2 34 (H) 22 - 32 mmol/L   Glucose, Bld 146 (H) 65 - 99 mg/dL   BUN 59 (H) 6 - 20 mg/dL   Creatinine, Ser 1.34 (H) 0.61 - 1.24 mg/dL   Calcium 9.7 8.9 - 10.3 mg/dL   GFR calc non Af Amer 49 (L) >60 mL/min   GFR calc Af Amer 56 (L) >60 mL/min    Comment: (NOTE) The eGFR has been calculated using the CKD EPI equation. This calculation has not been validated in all clinical situations. eGFR's persistently <60 mL/min signify possible Chronic Kidney Disease.    Anion gap 10 5 - 15  CBC     Status: Abnormal   Collection Time: 01/24/15  5:28 AM  Result Value Ref Range   WBC 9.7 4.0 - 10.5 K/uL   RBC 4.92 4.22 - 5.81 MIL/uL   Hemoglobin 14.5 13.0 - 17.0 g/dL   HCT 47.5 39.0 - 52.0 %   MCV 96.5 78.0 - 100.0 fL   MCH 29.5 26.0 - 34.0 pg   MCHC 30.5 30.0 - 36.0 g/dL   RDW 15.9 (H) 11.5 - 15.5 %   Platelets 174 150 - 400 K/uL  Glucose, capillary     Status: Abnormal   Collection Time: 01/24/15  8:00 AM  Result Value Ref Range   Glucose-Capillary 134 (H) 65 - 99 mg/dL  Glucose, capillary     Status: Abnormal   Collection Time: 01/24/15 12:30 PM  Result Value Ref Range   Glucose-Capillary 139 (H) 65 - 99 mg/dL      Component Value Date/Time   SDES BLOOD RIGHT HAND 01/19/2015 1024   SPECREQUEST BOTTLES DRAWN AEROBIC AND ANAEROBIC 10 CC EACH 01/19/2015 1024   CULT  01/19/2015 1024    STAPHYLOCOCCUS SPECIES (COAGULASE NEGATIVE) SUSCEPTIBILITIES PERFORMED ON PREVIOUS CULTURE WITHIN THE LAST 5 DAYS.    REPTSTATUS 01/24/2015 FINAL 01/19/2015 1024   Dg Chest Port 1 View  01/24/2015   CLINICAL DATA:  Respiratory failure. Coronary  disease. Hypertensive heart disease.  EXAM: PORTABLE CHEST - 1 VIEW  COMPARISON:  1 a prior  FINDINGS: Extubation. Removal of nasogastric tube. Midline trachea. Borderline cardiomegaly. No pleural effusion or pneumothorax. Clearing of right base airspace disease. Mild left base atelectasis remains.  IMPRESSION: Extubation and removal of nasogastric tube.  Improved aeration with mild left base atelectasis remaining.   Electronically Signed   By: Abigail Miyamoto M.D.   On: 01/24/2015 09:15   Dg Chest Port 1 View  01/23/2015   CLINICAL DATA:  Acute respiratory failure  EXAM: PORTABLE CHEST - 1 VIEW  COMPARISON:  01/21/2015  FINDINGS: Endotracheal tube is 5-6 cm above the carina, slightly retracted since prior study. NG tube enters the stomach. Bibasilar airspace opacities are noted, left greater than right, likely atelectasis. No significant change since prior study. Heart is upper limits normal in size. No significant visible effusions.  IMPRESSION: Continued bibasilar opacities, likely atelectasis, left greater than right.   Electronically Signed   By: Rolm Baptise M.D.   On: 01/23/2015 08:35   Recent Results (from the past 240 hour(s))  Culture, blood (routine  x 2)     Status: None   Collection Time: 01/19/15  9:50 AM  Result Value Ref Range Status   Specimen Description BLOOD LEFT HAND  Final   Special Requests BOTTLES DRAWN AEROBIC AND ANAEROBIC  10 CC EACH  Final   Culture  Setup Time   Final    GRAM POSITIVE COCCI IN CLUSTERS IN PAIRS AEROBIC BOTTLE ONLY CRITICAL RESULT CALLED TO, READ BACK BY AND VERIFIED WITH: G HARDUK,RN 6/29 0539 RHOLMES CONFIRMED BY R GREEN    Culture STAPHYLOCOCCUS SPECIES (COAGULASE NEGATIVE)  Final   Report Status 01/24/2015 FINAL  Final   Organism ID, Bacteria STAPHYLOCOCCUS SPECIES (COAGULASE NEGATIVE)  Final      Susceptibility   Staphylococcus species (coagulase negative) - MIC*    CIPROFLOXACIN 4 RESISTANT Resistant     GENTAMICIN <=0.5 SENSITIVE Sensitive      OXACILLIN >=4 RESISTANT Resistant     VANCOMYCIN 2 SENSITIVE Sensitive     TRIMETH/SULFA 160 RESISTANT Resistant     CLINDAMYCIN <=0.25 SENSITIVE Sensitive     RIFAMPIN <=0.5 SENSITIVE Sensitive     Inducible Clindamycin NEGATIVE Sensitive     * STAPHYLOCOCCUS SPECIES (COAGULASE NEGATIVE)  Culture, respiratory (NON-Expectorated)     Status: None   Collection Time: 01/19/15 10:15 AM  Result Value Ref Range Status   Specimen Description TRACHEAL ASPIRATE  Final   Special Requests NONE  Final   Gram Stain   Final    MODERATE WBC PRESENT,BOTH PMN AND MONONUCLEAR NO SQUAMOUS EPITHELIAL CELLS SEEN NO ORGANISMS SEEN Performed at [facility]    Culture   Final    Non-Pathogenic Oropharyngeal-type Flora Isolated. Performed at [facility]    Report Status 01/21/2015 FINAL  Final  Urine culture     Status: None   Collection Time: 01/19/15 10:21 AM  Result Value Ref Range Status   Specimen Description URINE, CATHETERIZED  Final   Special Requests NONE  Final   Culture NO GROWTH 1 DAY  Final   Report Status 01/20/2015 FINAL  Final  Culture, blood (routine x 2)     Status: None   Collection Time: 01/19/15 10:24 AM  Result Value Ref Range Status   Specimen Description BLOOD RIGHT HAND  Final   Special Requests BOTTLES DRAWN AEROBIC AND ANAEROBIC 10 CC EACH  Final   Culture  Setup Time   Final    GRAM POSITIVE COCCI IN CLUSTERS IN PAIRS AEROBIC BOTTLE ONLY CRITICAL RESULT CALLED TO, READ BACK BY AND VERIFIED WITH: G HARDUK,RN 01/20/15 0539 RHOLMES CONFIRMED BY R GREEN    Culture   Final    STAPHYLOCOCCUS SPECIES (COAGULASE NEGATIVE) SUSCEPTIBILITIES PERFORMED ON PREVIOUS CULTURE WITHIN THE LAST 5 DAYS.    Report Status 01/24/2015 FINAL  Final  Clostridium Difficile by PCR (not at [facility])     Status: None   Collection Time: 01/21/15 12:10 PM  Result Value Ref Range Status   C difficile by pcr NEGATIVE NEGATIVE Final      01/24/2015, 1:34 PM     LOS: 15 days

## 2015-01-24 NOTE — Progress Notes (Signed)
ANTIBIOTIC CONSULT NOTE - FOLLOW UP  Pharmacy Consult for Vancomycin Indication: CNS Bacteremia  Allergies  Allergen Reactions  . Statins Other (See Comments)    Severe leg myalgias, weakness    Patient Measurements: Height: 5\' 9"  (175.3 cm) Weight: 229 lb 4.5 oz (104 kg) IBW/kg (Calculated) : 70.7 Adjusted Body Weight:    Vital Signs: Temp: 98.8 F (37.1 C) (07/03 1011) Temp Source: Oral (07/03 1011) BP: 108/57 mmHg (07/03 1011) Pulse Rate: 78 (07/03 1011) Intake/Output from previous day: 07/02 0701 - 07/03 0700 In: 1715 [I.V.:1275; NG/GT:440] Out: 1950 [Urine:1850; Stool:100] Intake/Output from this shift:    Labs:  Recent Labs  01/23/15 0219 01/23/15 1732 01/24/15 0528  WBC 12.0*  --  9.7  HGB 14.9  --  14.5  PLT 191  --  174  CREATININE 1.80* 1.46* 1.34*   Estimated Creatinine Clearance: 53.1 mL/min (by C-G formula based on Cr of 1.34). No results for input(s): VANCOTROUGH, VANCOPEAK, VANCORANDOM, GENTTROUGH, GENTPEAK, GENTRANDOM, TOBRATROUGH, TOBRAPEAK, TOBRARND, AMIKACINPEAK, AMIKACINTROU, AMIKACIN in the last 72 hours.    Assessment: Admit Complaint: 81yo male presents with AMS and acute respiratory failure.   ID: GPC bacteremia, likely central line which was removed- Tmax 99.5. LA down 2, PCT 0.25. Not planning on getting a TEE - just treat for 6 weeks with vancomycin.  Aortic valve calcified with aortic stenosis.  New murmur heard.  Zosyn 6/19>>6/22; 6/29>>7/1 Vanc 6/19>> 6/22; 6/29>> CTX  6/22 >> (6/26)  6/30: Cdiff negative. 6/28 BCx x2>> 2/2 MRSE  6/28 UCx >> NEG 6/28 Trach aspirate>> normal flora 6/19 UCx: 20K pseudomonas (colonizer?/ pan sensitive) 6/19 MRSA pcr (-)  Goal of Therapy:  Vancomycin trough level 15-20 mcg/ml  Plan:  Increase Vancomycin to 1500mg  IV q24h with improved renal function.   Sira Adsit S. Merilynn Finland, PharmD, BCPS Clinical Staff Pharmacist Pager [phone]  Misty Stanley Stillinger 01/24/2015,11:32 AM

## 2015-01-24 NOTE — Progress Notes (Signed)
Patient Demographics:    [name], is a 81 y.o. male, DOB - 1932-08-04, ZOX:[id number]  Admit date - 01/09/2015   Admitting Physician Hillary Bow, DO  Outpatient Primary MD for the patient is [name], MD  LOS - 15   Assumed care of the patient on 01/24/2015 on day 15 of hospital stay. Pink transferred from critical care service.   Summary  [name] is a 81 y.o. male with pmh of CAD, DM, CKD, AS, he also has a right foot ulcer for the last few months for which he is getting outpatient wound care, Patient presents to the ED with c/o intermittent, ongoing, and unchanged SOB x2 weeks. Worsened today. SOB is worsened with mild exertion. He has aortic stenosis for a long time and has had exertional dyspnea for several months, he follows with Dr. Donnie Aho who is his cardiologist. He was admitted for acute respiratory failure secondary to possible critical left ear induced left-sided heart failure with pulmonary edema, possible HCAP, he also developed non-ST elevation MI , he was seen by cardiology, he was tried on BiPAP initially however he continued to get worse and was intubated and transferred to pulmonary critical care.   In the hospital he developed's coag-negative staph bacteremia, source is unclear, TTE did not show any vegetation and he remains too frail for a TEE. He was transferred under hospitalist care on day 15 of his hospital stay I'll resume his care on 01/24/2015.    Chief Complaint  Patient presents with  . Shortness of Breath        Subjective:    [name] today has, No headache, No chest pain, No abdominal pain - No Nausea, No new weakness tingling or numbness, No Cough - SOB.     Assessment  & Plan :    1. Acute on chronic hypoxic respiratory failure  in the setting of moderate to severe aortic stenosis. Non-ST elevation MI. Pulmonary edema + ? HCAP - was intubated and was under the care of ICU physicians till 01/23/2015, was extubated 01/23/2015. Currently on room air with nonacute chest x-ray, has finished anti-biotics for HCAP, on aspirin, beta blocker along with Plavix for his non-ST elevation MI. Cardiology following. Cardiology decide whether patient is a candidate for left heart cath or not in the light of his ongoing bacteremia.   2. Coag negative bacteremia. Source unclear. Does have a chronic wound on the right foot. Also aortic stenosis, currently on vancomycin. ID requested to see. Poor candidate for TEE. TTE does not show any frank vegetation. Most likely will require empiric IV anti-biotics for prolonged period.   3. Dysphagia postextubation. Currently nothing by mouth speech to eval.   4. Hyponatremia with dehydration and acute renal failure. Hydrate with D5W monitor BMP.   5. Hypokalemia. Replace and monitor.   6. Non-ST elevation MI. Cardiology following, currently chest pain-free, on aspirin-Plavix-beta blocker as tolerated orally, while nothing by mouth Will give aspirin suppository. Further management per cardiology.   7. History of gout. Resume allopurinol once taking medications orally.   8. GERD. Since nothing by mouth we'll switch to IV PPI for now.   9. Essential hypertension. As needed IV hydralazine. Oral medications once tolerated.  Code Status : No Intubation  Family Communication  : daughter in detail 01-23-15, family bedside  Disposition Plan  : TBD  Consults  :  PCCM, Cards, ID  Procedures  :   6/18 CT ABD >> post-op changes in L groin, increased density in bladder, bibasilar atx 6/18 ECHO >> LVEF 55-60%, grade II diastolic dysfunction, moderate AS (wosening since last ECHO) 6/20 EEG>>>metabolic vs drug affect, no focus  6/19 CT of Head >> Stable moderate diffuse cerebral atrophy and  mild chronic small vessel white matter ischemic changes 6/20 Echo: Left ventricle: The cavity size was normal. Wall thickness was normal. Systolic function was normal. The estimated ejection fraction was in the range of 60% to 65%. Wall motion was normal;there were no regional wall motion abnormalities. 6/22: MR Brain: No infarct, age related atrophy, mild chronic small vessel disease  SIGNIFICANT EVENTS: 6/17 RLE arteriography per Dr. Edilia Bo for PVD and non-healing R toe wound 6/18 Admit to [facility] with complaints of SOB x 2 weeks 6/19 Decompensated early am, required intubation / ICU transfer 6/20-trop elevation, hep , asa 6/21: polymyoclonus with when precedex stopped 6/22: continued plt count drop to 93 down from 131,agitation required change in sedation to prop 6/23: worsening chest film, plt stable 7/2>> staph bacteremia    DVT Prophylaxis  :  Heparin    Lab Results  Component Value Date   PLT 174 01/24/2015    Inpatient Medications  Scheduled Meds: . antiseptic oral rinse  7 mL Mouth Rinse QID  . aspirin  81 mg Per Tube Daily  . chlorhexidine  15 mL Mouth Rinse BID  . clopidogrel  75 mg Per Tube Daily  . feeding supplement (PRO-STAT SUGAR FREE 64)  60 mL Per Tube BID  . feeding supplement (VITAL HIGH PROTEIN)  1,000 mL Per Tube Q24H  . free water  350 mL Per Tube Q4H  . heparin subcutaneous  5,000 Units Subcutaneous 3 times per day  . insulin aspart  0-20 Units Subcutaneous 6 times per day  . metoprolol tartrate  12.5 mg Oral BID  . pantoprazole sodium  40 mg Per Tube Daily  . vancomycin  1,500 mg Intravenous Q48H   Continuous Infusions: . dextrose 75 mL/hr at 01/24/15 0141   PRN Meds:.acetaminophen (TYLENOL) oral liquid 160 mg/5 mL, albuterol, fentaNYL, metoprolol, sodium chloride  Antibiotics  :     Anti-infectives    Start     Dose/Rate Route Frequency Ordered Stop   01/22/15 0600  vancomycin (VANCOCIN) 1,250 mg in sodium chloride 0.9 % 250 mL IVPB  Status:   Discontinued     1,250 mg 166.7 mL/hr over 90 Minutes Intravenous Every 48 hours 01/20/15 0558 01/20/15 1131   01/22/15 0600  vancomycin (VANCOCIN) 1,500 mg in sodium chloride 0.9 % 500 mL IVPB     1,500 mg 250 mL/hr over 120 Minutes Intravenous Every 48 hours 01/20/15 1131     01/20/15 1300  piperacillin-tazobactam (ZOSYN) IVPB 3.375 g  Status:  Discontinued     3.375 g 12.5 mL/hr over 240 Minutes Intravenous Every 8 hours 01/20/15 1259 01/22/15 1542   01/20/15 0630  vancomycin (VANCOCIN) 1,500 mg in sodium chloride 0.9 % 500 mL IVPB     1,500 mg 250 mL/hr over 120 Minutes Intravenous  Once 01/20/15 0558 01/20/15 0828   01/13/15 1045  cefTRIAXone (ROCEPHIN) 1 g in dextrose 5 % 50 mL IVPB - Premix     1 g 100 mL/hr over 30 Minutes Intravenous Every 24 hours  01/13/15 1038 01/16/15 1126   01/11/15 1000  vancomycin (VANCOCIN) 1,250 mg in sodium chloride 0.9 % 250 mL IVPB  Status:  Discontinued     1,250 mg 166.7 mL/hr over 90 Minutes Intravenous Every 24 hours 01/10/15 0940 01/13/15 1038   01/10/15 1600  piperacillin-tazobactam (ZOSYN) IVPB 3.375 g  Status:  Discontinued     3.375 g 12.5 mL/hr over 240 Minutes Intravenous Every 8 hours 01/10/15 0940 01/13/15 1038   01/10/15 1000  vancomycin (VANCOCIN) 2,000 mg in sodium chloride 0.9 % 500 mL IVPB     2,000 mg 250 mL/hr over 120 Minutes Intravenous  Once 01/10/15 0927 01/10/15 1218   01/10/15 0930  piperacillin-tazobactam (ZOSYN) IVPB 3.375 g     3.375 g 100 mL/hr over 30 Minutes Intravenous  Once 01/10/15 0927 01/10/15 1048        Objective:   Filed Vitals:   01/23/15 2345 01/24/15 0000 01/24/15 0525 01/24/15 1011  BP:  129/58 139/47 108/57  Pulse:  73 63 78  Temp: 98.4 F (36.9 C)  98.5 F (36.9 C) 98.8 F (37.1 C)  TempSrc: Oral  Oral Oral  Resp:  34 16 22  Height:      Weight:      SpO2:  91% 96% 98%    Wt Readings from Last 3 Encounters:  01/23/15 104 kg (229 lb 4.5 oz)  01/08/15 108.863 kg (240 lb)  12/23/14  112.447 kg (247 lb 14.4 oz)     Intake/Output Summary (Last 24 hours) at 01/24/15 1040 Last data filed at 01/24/15 0700  Gross per 24 hour  Intake   1140 ml  Output   1950 ml  Net   -810 ml     Physical Exam  Awake Alert, Oriented X 3, appears weak,No new F.N deficits, Normal affect [place].AT,PERRAL Supple Neck,No JVD, No cervical lymphadenopathy appriciated.  Symmetrical Chest wall movement, Good air movement bilaterally, Coarse B. sounds RRR,No Gallops,Rubs or new Murmurs, No Parasternal Heave +ve B.Sounds, Abd Soft, No tenderness, No organomegaly appriciated, No rebound - guarding or rigidity. No Cyanosis, Clubbing or edema, No new Rash or bruise       Data Review:   Micro Results Recent Results (from the past 240 hour(s))  Culture, blood (routine x 2)     Status: None   Collection Time: 01/19/15  9:50 AM  Result Value Ref Range Status   Specimen Description BLOOD LEFT HAND  Final   Special Requests BOTTLES DRAWN AEROBIC AND ANAEROBIC  10 CC EACH  Final   Culture  Setup Time   Final    GRAM POSITIVE COCCI IN CLUSTERS IN PAIRS AEROBIC BOTTLE ONLY CRITICAL RESULT CALLED TO, READ BACK BY AND VERIFIED WITH: G HARDUK,RN 6/29 0539 RHOLMES CONFIRMED BY R GREEN    Culture STAPHYLOCOCCUS SPECIES (COAGULASE NEGATIVE)  Final   Report Status 01/24/2015 FINAL  Final   Organism ID, Bacteria STAPHYLOCOCCUS SPECIES (COAGULASE NEGATIVE)  Final      Susceptibility   Staphylococcus species (coagulase negative) - MIC*    CIPROFLOXACIN 4 RESISTANT Resistant     GENTAMICIN <=0.5 SENSITIVE Sensitive     OXACILLIN >=4 RESISTANT Resistant     VANCOMYCIN 2 SENSITIVE Sensitive     TRIMETH/SULFA 160 RESISTANT Resistant     CLINDAMYCIN <=0.25 SENSITIVE Sensitive     RIFAMPIN <=0.5 SENSITIVE Sensitive     Inducible Clindamycin NEGATIVE Sensitive     * STAPHYLOCOCCUS SPECIES (COAGULASE NEGATIVE)  Culture, respiratory (NON-Expectorated)     Status: None  Collection Time: 01/19/15 10:15 AM    Result Value Ref Range Status   Specimen Description TRACHEAL ASPIRATE  Final   Special Requests NONE  Final   Gram Stain   Final    MODERATE WBC PRESENT,BOTH PMN AND MONONUCLEAR NO SQUAMOUS EPITHELIAL CELLS SEEN NO ORGANISMS SEEN Performed at [facility]    Culture   Final    Non-Pathogenic Oropharyngeal-type Flora Isolated. Performed at [facility]    Report Status 01/21/2015 FINAL  Final  Urine culture     Status: None   Collection Time: 01/19/15 10:21 AM  Result Value Ref Range Status   Specimen Description URINE, CATHETERIZED  Final   Special Requests NONE  Final   Culture NO GROWTH 1 DAY  Final   Report Status 01/20/2015 FINAL  Final  Culture, blood (routine x 2)     Status: None   Collection Time: 01/19/15 10:24 AM  Result Value Ref Range Status   Specimen Description BLOOD RIGHT HAND  Final   Special Requests BOTTLES DRAWN AEROBIC AND ANAEROBIC 10 CC EACH  Final   Culture  Setup Time   Final    GRAM POSITIVE COCCI IN CLUSTERS IN PAIRS AEROBIC BOTTLE ONLY CRITICAL RESULT CALLED TO, READ BACK BY AND VERIFIED WITH: G HARDUK,RN 01/20/15 0539 RHOLMES CONFIRMED BY R GREEN    Culture   Final    STAPHYLOCOCCUS SPECIES (COAGULASE NEGATIVE) SUSCEPTIBILITIES PERFORMED ON PREVIOUS CULTURE WITHIN THE LAST 5 DAYS.    Report Status 01/24/2015 FINAL  Final  Clostridium Difficile by PCR (not at [facility])     Status: None   Collection Time: 01/21/15 12:10 PM  Result Value Ref Range Status   C difficile by pcr NEGATIVE NEGATIVE Final    Radiology Reports   Ct Head Wo Contrast  01/10/2015   CLINICAL DATA:  Altered mental status.  EXAM: CT HEAD WITHOUT CONTRAST  TECHNIQUE: Contiguous axial images were obtained from the base of the skull through the vertex without intravenous contrast.  COMPARISON:  07/13/2014.  FINDINGS: Diffusely enlarged ventricles and subarachnoid spaces. Patchy white matter low density in both cerebral hemispheres. Stable small, more focal area of  low density in the left frontoparietal white matter. No intracranial hemorrhage, mass lesion or CT evidence of acute infarction. Unremarkable bones and included paranasal sinuses.  IMPRESSION: 1. No acute abnormality. 2. Stable moderate diffuse cerebral atrophy and mild chronic small vessel white matter ischemic changes in both cerebral hemispheres. 3. 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   Electronically Signed   By: Beckie Salts M.D.   On: 01/10/2015 12:34   Mr Brain Wo Contrast  01/14/2015   CLINICAL DATA:  Initial evaluation for acute encephalopathy.  EXAM: MRI HEAD WITHOUT CONTRAST  TECHNIQUE: Multiplanar, multiecho pulse sequences of the brain and surrounding structures were obtained without intravenous contrast.  COMPARISON:  Prior CT from 01/10/2015  FINDINGS: Diffuse prominence of the CSF containing spaces is compatible with generalized cerebral atrophy. There are scattered patchy T2/FLAIR hyperintense foci within the periventricular white matter, nonspecific, but likely related to chronic small vessel ischemic disease. These are similar relative to prior MRI.  No abnormal foci of restricted diffusion to suggest acute intracranial infarct. Gray-white matter differentiation maintained. Normal intravascular flow voids are preserved. No acute or chronic intracranial hemorrhage.  No mass lesion, mass effect, or midline shift. No hydrocephalus. No extra-axial fluid collection. Hippocampi symmetric in size with normal morphology and signal intensity.  Craniocervical junction within normal limits. Pituitary gland normal.  No acute abnormality about the orbits. Sequela prior bilateral lens extraction noted.  Fluid density present within the nasopharynx, likely related intubation. Scattered mucosal thickening present within the sphenoid sinuses and ethmoidal air cells. Bilateral mastoid effusions present. Inner ear structures normal.  Scattered degenerative changes present within the  visualized upper cervical spine. Bone marrow signal intensity within normal limits. Scalp soft tissues unremarkable.  IMPRESSION: 1. No acute intracranial infarct or other process identified. 2. Generalized age-related cerebral atrophy with mild chronic small vessel ischemic disease.   Electronically Signed   By: Rise Mu M.D.   On: 01/14/2015 04:30   Ct Abdomen Pelvis W Contrast  01/09/2015   CLINICAL DATA:  Intermittent ongoing shortness of breath for 2 weeks, worse today. Abdominal pain for a few days with nausea. Recent discharge from hospital early this morning after stent placed in the lower extremity.  EXAM: CT ABDOMEN AND PELVIS WITH CONTRAST  TECHNIQUE: Multidetector CT imaging of the abdomen and pelvis was performed using the standard protocol following bolus administration of intravenous contrast.  CONTRAST:  34mL OMNIPAQUE IOHEXOL 300 MG/ML  SOLN  COMPARISON:  None.  FINDINGS: Atelectasis in the lung bases. Coronary artery and aortic valve calcifications.  Mild diffuse fatty infiltration of the liver. No focal liver lesions. The gallbladder, spleen, pancreas, adrenal glands, kidneys, inferior vena cava, and retroperitoneal lymph nodes are unremarkable. Calcification of the aorta without aneurysm. Calcification of the renal artery origins. Stomach, small bowel, and colon are not abnormally distended. No discrete wall thickening is noted. No free air or free fluid in the abdomen. Abdominal wall musculature appears intact.  Pelvis: The appendix is normal. Prostate gland is enlarged. There is increased density in the bladder which could be early contrast or may indicate hemorrhage. Correlation with urinalysis is recommended. No bladder wall thickening. Mildly enlarged lymph node in the left external iliac chain measuring 16 mm diameter and in the right iliac chain measuring 13 mm diameter, probably reactive. Skin defect and infiltration in the left groin is probably postoperative. Small right  inguinal hernia containing fat. Degenerative changes in the spine. No destructive bone lesions. Postoperative laminectomies at L3-4, L4-5, and L5-S1.  IMPRESSION: Postoperative changes in the left groin region with infiltration in the subcutaneous fat, likely postoperative. Prominent lymph nodes in the iliac chains bilaterally are most likely to be reactive. Increased density in the bladder suggest possible hemorrhage. Correlation with urinalysis. Right inguinal hernia containing fat.   Electronically Signed   By: Burman Nieves M.D.   On: 01/09/2015 04:16   Dg Chest Port 1 View  01/24/2015   CLINICAL DATA:  Respiratory failure. Coronary disease. Hypertensive heart disease.  EXAM: PORTABLE CHEST - 1 VIEW  COMPARISON:  1 a prior  FINDINGS: Extubation. Removal of nasogastric tube. Midline trachea. Borderline cardiomegaly. No pleural effusion or pneumothorax. Clearing of right base airspace disease. Mild left base atelectasis remains.  IMPRESSION: Extubation and removal of nasogastric tube.  Improved aeration with mild left base atelectasis remaining.   Electronically Signed   By: Jeronimo Greaves M.D.   On: 01/24/2015 09:15   Dg Chest Port 1 View  01/23/2015   CLINICAL DATA:  Acute respiratory failure  EXAM: PORTABLE CHEST - 1 VIEW  COMPARISON:  01/21/2015  FINDINGS: Endotracheal tube is 5-6 cm above the carina, slightly retracted since prior study. NG tube enters the stomach. Bibasilar airspace opacities are noted, left greater than right, likely atelectasis. No significant change since prior study. Heart is upper limits normal in size.  No significant visible effusions.  IMPRESSION: Continued bibasilar opacities, likely atelectasis, left greater than right.   Electronically Signed   By: Charlett Nose M.D.   On: 01/23/2015 08:35        CBC  Recent Labs Lab 01/19/15 1830 01/20/15 0512 01/21/15 0231 01/23/15 0219 01/24/15 0528  WBC 10.7* 10.9* 11.5* 12.0* 9.7  HGB 16.1 16.3 15.5 14.9 14.5  HCT 51.0  52.8* 51.1 49.5 47.5  PLT 207 227 211 191 174  MCV 94.8 95.5 96.4 97.2 96.5  MCH 29.9 29.5 29.2 29.3 29.5  MCHC 31.6 30.9 30.3 30.1 30.5  RDW 16.5* 16.5* 16.8* 16.5* 15.9*    Chemistries   Recent Labs Lab 01/19/15 1830 01/20/15 0512 01/21/15 0231 01/22/15 0222 01/22/15 1215 01/23/15 0219 01/23/15 1732 01/24/15 0528  NA 153* 154* 155* 154* 158* 154* 153* 155*  K 3.5 3.4* 3.2* 2.7* 3.2* 2.9* 3.3* 3.2*  CL 103 101 105 108 111 110 113* 111  CO2 40* 38* 36* 34* 35* 34* 32 34*  GLUCOSE 182* 177* 204* 216* 208* 165* 163* 146*  BUN 113* 114* 127* 127* 114* 98* 79* 59*  CREATININE 2.09* 2.22* 2.71* 2.31* 2.24* 1.80* 1.46* 1.34*  CALCIUM 9.4 9.7 9.4 9.3 9.4 9.1 9.2 9.7  MG  --  3.4* 3.4*  --   --  3.2*  --   --   AST 76*  --   --   --   --   --   --   --   ALT 34  --   --   --   --   --   --   --   ALKPHOS 51  --   --   --   --   --   --   --   BILITOT 0.7  --   --   --   --   --   --   --    ------------------------------------------------------------------------------------------------------------------ estimated creatinine clearance is 53.1 mL/min (by C-G formula based on Cr of 1.34). ------------------------------------------------------------------------------------------------------------------ No results for input(s): HGBA1C in the last 72 hours. ------------------------------------------------------------------------------------------------------------------ No results for input(s): CHOL, HDL, LDLCALC, TRIG, CHOLHDL, LDLDIRECT in the last 72 hours. ------------------------------------------------------------------------------------------------------------------ No results for input(s): TSH, T4TOTAL, T3FREE, THYROIDAB in the last 72 hours.  Invalid input(s): FREET3 ------------------------------------------------------------------------------------------------------------------ No results for input(s): VITAMINB12, FOLATE, FERRITIN, TIBC, IRON, RETICCTPCT in the last 72  hours.  Coagulation profile No results for input(s): INR, PROTIME in the last 168 hours.  No results for input(s): DDIMER in the last 72 hours.  Cardiac Enzymes  Recent Labs Lab 01/19/15 1830 01/19/15 2041 01/20/15 0512 01/20/15 1435  CKMB 2.6  --  1.5  --   TROPONINI 0.41* 0.49* 0.54* 0.42*   ------------------------------------------------------------------------------------------------------------------ Invalid input(s): POCBNP   Time Spent in minutes   35   SINGH,PRASHANT K M.D on 01/24/2015 at 10:40 AM  Between 7am to 7pm - Pager - [phone]  After 7pm go to [email or web address] - password [facility]  [facility]   Office  [phone]

## 2015-01-25 ENCOUNTER — Inpatient Hospital Stay (HOSPITAL_COMMUNITY): Payer: Medicare Other

## 2015-01-25 ENCOUNTER — Encounter (HOSPITAL_COMMUNITY): Payer: Self-pay | Admitting: Cardiology

## 2015-01-25 DIAGNOSIS — M1A9XX1 Chronic gout, unspecified, with tophus (tophi): Secondary | ICD-10-CM

## 2015-01-25 DIAGNOSIS — R7881 Bacteremia: Secondary | ICD-10-CM | POA: Insufficient documentation

## 2015-01-25 DIAGNOSIS — R5381 Other malaise: Secondary | ICD-10-CM

## 2015-01-25 LAB — BASIC METABOLIC PANEL
Anion gap: 7 (ref 5–15)
BUN: 40 mg/dL — ABNORMAL HIGH (ref 6–20)
CO2: 29 mmol/L (ref 22–32)
Calcium: 9.2 mg/dL (ref 8.9–10.3)
Chloride: 116 mmol/L — ABNORMAL HIGH (ref 101–111)
Creatinine, Ser: 1.19 mg/dL (ref 0.61–1.24)
GFR, EST NON AFRICAN AMERICAN: 56 mL/min — AB (ref >60)
GLUCOSE: 138 mg/dL — AB (ref 65–99)
Potassium: 3.7 mmol/L (ref 3.5–5.1)
Sodium: 152 mmol/L — ABNORMAL HIGH (ref 135–145)

## 2015-01-25 LAB — BLOOD GAS, ARTERIAL
Acid-Base Excess: 2.6 mmol/L — ABNORMAL HIGH (ref 0.0–2.0)
BICARBONATE: 26.4 meq/L — AB (ref 20.0–24.0)
DRAWN BY: 225631
O2 CONTENT: 4 L/min
O2 Saturation: 95.4 %
PATIENT TEMPERATURE: 98.6
TCO2: 27.5 mmol/L (ref 0–100)
pCO2 arterial: 38.5 mmHg (ref 35.0–45.0)
pH, Arterial: 7.451 — ABNORMAL HIGH (ref 7.350–7.450)
pO2, Arterial: 79.9 mmHg — ABNORMAL LOW (ref 80.0–100.0)

## 2015-01-25 LAB — CBC
HEMATOCRIT: 47.5 % (ref 39.0–52.0)
HEMOGLOBIN: 14.4 g/dL (ref 13.0–17.0)
MCH: 28.8 pg (ref 26.0–34.0)
MCHC: 30.3 g/dL (ref 30.0–36.0)
MCV: 95 fL (ref 78.0–100.0)
Platelets: 161 10*3/uL (ref 150–400)
RBC: 5 MIL/uL (ref 4.22–5.81)
RDW: 15.6 % — ABNORMAL HIGH (ref 11.5–15.5)
WBC: 8.7 10*3/uL (ref 4.0–10.5)

## 2015-01-25 LAB — GLUCOSE, CAPILLARY
GLUCOSE-CAPILLARY: 109 mg/dL — AB (ref 65–99)
GLUCOSE-CAPILLARY: 112 mg/dL — AB (ref 65–99)
GLUCOSE-CAPILLARY: 125 mg/dL — AB (ref 65–99)
Glucose-Capillary: 106 mg/dL — ABNORMAL HIGH (ref 65–99)
Glucose-Capillary: 108 mg/dL — ABNORMAL HIGH (ref 65–99)
Glucose-Capillary: 113 mg/dL — ABNORMAL HIGH (ref 65–99)
Glucose-Capillary: 136 mg/dL — ABNORMAL HIGH (ref 65–99)

## 2015-01-25 MED ORDER — DEXTROSE 5 % IV SOLN
INTRAVENOUS | Status: DC
  Administered 2015-01-25 – 2015-01-27 (×5): via INTRAVENOUS

## 2015-01-25 MED ORDER — STARCH (THICKENING) PO POWD: ORAL | Status: DC | PRN

## 2015-01-25 MED ORDER — RESOURCE THICKENUP CLEAR PO POWD
ORAL | Status: DC | PRN
  Filled 2015-01-25: qty 125

## 2015-01-25 MED ORDER — PANTOPRAZOLE SODIUM 40 MG PO PACK
40.0000 mg | PACK | Freq: Every day | ORAL | Status: DC
  Filled 2015-01-25 (×2): qty 20

## 2015-01-25 NOTE — Care Management Note (Signed)
Case Management Note  Patient Details  Name: [name] MRN: [id number] Date of Birth: 1933-07-06  Subjective/Objective:                    Action/Plan:   Expected Discharge Date:                  Expected Discharge Plan:  Home w Home Health Services  In-House Referral:     Discharge planning Services  CM Consult  Post Acute Care Choice:    Choice offered to:     DME Arranged:    DME Agency:     HH Arranged:    HH Agency:     Status of Service:  In process, will continue to follow  Medicare Important Message Given:  Yes-second notification given Date Medicare IM Given:    Medicare IM give by:    Date Additional Medicare IM Given:   01/25/15 Additional Medicare Important Message give by:   [name], RN, BSN  If discussed at Long Length of Stay Meetings, dates discussed:    Additional Comments:  [name], RN 01/25/2015, 12:52 PM

## 2015-01-25 NOTE — Consult Note (Signed)
Physical Medicine and Rehabilitation Consult   Reason for Consult: Encephalopathy with deconditioning due to multiple medical issues.  Referring Physician:  Dr. Thedore Mins.    HPI: [name] is a 81 y.o. male with history of CAD, AS, CKD, DM type 2 with chronic foot ulcer treated by wound clinic and recent RLE arteriography 06/17. He was admitted on 01/09/15 with progressive SOB with acute respiratory failure requiring intubation. He required pressors due to shock and was found to have elevated cardiac enzymes due ot NSTEMI he was treated for fluid overload due to acute on chronic diastolic CHF and started on IV heparin for ACS treatment. 2D echo with worsening of aortic stenosis and cardiology recommended treating patient with prolonged antibiotics for presumptive endocarditis.  MRI brain done 06/23 due to encephalopathy and generalized age related atrophy.  He developed fevers on 06/29 due to staph bacteremia and Zosyn changed to Vancomycin. He tolerated extubation on 07/02 and currently NPO due to suspected acute reversible dysphagia due to prolonged intubation.  Dr. Ninetta Lights consulted for input and recommends one month treatment with Vancomycin as patient not a good candidate for TEE due to potential for respiratory failure. Therapy evaluations done this weekend and CIR recommended by MD and Rehab team.    Review of Systems  Unable to perform ROS: mental acuity  Eyes: Negative for blurred vision.  Respiratory: Positive for shortness of breath. Negative for cough.   Cardiovascular: Negative for chest pain.  Gastrointestinal: Negative for heartburn.  Musculoskeletal: Negative for myalgias.  Skin:       Chronic breakdown   Neurological: Negative for dizziness.  Psychiatric/Behavioral: Negative for depression.     Past Medical History  Diagnosis Date  . Gout, unspecified     severe dz, "treatment done that last 15 years"  . Obesity (BMI 30-39.9)   . Restless leg syndrome   .  History of retinal detachment 1994  . Hypertensive heart disease   . CAD (coronary artery disease), native coronary artery   . History of pericarditis 2007    MSSA, s/p pericardial window  . GERD   . Osteoarthritis   . Sleep apnea in adult     CPAP qhs  . TIA (transient ischemic attack) 2005  . BPH (benign prostatic hypertrophy)     "minor"  . Lumbar spinal stenosis   . Carotid artery occlusion   . Unspecified venous (peripheral) insufficiency   . Stroke May 2003  . Anxiety   . Depression   . Aortic stenosis     ECHO 05/29/14 moderate AS Mean gradient 21 mm EF 55% ECHO 07/09/13  Mean gradient 12 mm Mild to moderate AS EF 50%    . CAD (coronary artery disease)     02/11/09  Xience stent to circumflex 2.5 x 18 mm postdilated to 2.75 mm  Cath sept 2011  normal Left main, 20 % stenosis proximal LAD, 40% stenosis mid LAD, 70% stenosis proximal Diag 1, 90% stenosis prox CFX, 90% first OM, small and nondominant RCA;   . Carotid artery disease     Prior TIA 2006 with left CEA by Dr. Arbie Cookey Recurrent TIA in April 2011   . Type 2 diabetes mellitus with vascular disease   . Lumbar disc disease   . CKD (chronic kidney disease), stage III 02/08/2014  . CHF (congestive heart failure)     Past Surgical History  Procedure Laterality Date  . Pericardial window  2007  . Left arm  2008  shoulder  . Right arm  1970's    shoulder  . Cataract extraction      Left side x's 2 and right  . Retinal detachment surgery      left side  . Carotid endarterectomy Left 12-06-05    cea  . Coronary angioplasty  01/2009    x 1 stent  . Colonoscopy    . Carpal tunnel release Bilateral   . Back surgery  2013    removed bone spurs  . Inguinal hernia repair Left 10/01/2013    Procedure: HERNIA REPAIR INGUINAL ADULT;  Surgeon: Axel Filler, MD;  Location: [facility];  Service: General;  Laterality: Left;  . Insertion of mesh Left 10/01/2013    Procedure: INSERTION OF MESH;  Surgeon: Axel Filler, MD;   Location: [facility];  Service: General;  Laterality: Left;  . Finger amputation  May 2015    JUST TO FIRST JOINT RIGHT HAND  LAST FINGER ( Right 5th finger)  . Peripheral vascular catheterization N/A 01/08/2015    Procedure: Abdominal Aortogram;  Surgeon: Chuck Hint, MD;  Location: [facility] INVASIVE CV LAB;  Service: Cardiovascular;  Laterality: N/A;    Family History  Problem Relation Age of Onset  . Heart disease Mother     Before age 38  . Heart attack Mother   . Diabetes Father   . Heart disease Father   . Breast cancer Sister   . Cancer Sister   . Prostate cancer Brother   . Lung cancer Brother   . Cancer Brother   . Diabetes Daughter   . Diabetes Son   . Hypertension Son      Social History:  Lives alone. Independent but limited due to SOB.  Son lives in the neighborhood and can check in past discharge. Three daughters in area help with housework and meals. Per reports that he has never smoked. He has never used smokeless tobacco. Per reports that he does not drink alcohol or use illicit drugs.   Allergies  Allergen Reactions  . Statins Other (See Comments)    Severe leg myalgias, weakness    Medications Prior to Admission  Medication Sig Dispense Refill  . acetaminophen (TYLENOL) 325 MG tablet Take 2 tablets (650 mg total) by mouth every 6 (six) hours as needed for mild pain or moderate pain. (Patient taking differently: Take 325 mg by mouth every 6 (six) hours as needed for mild pain or moderate pain. )    . allopurinol (ZYLOPRIM) 300 MG tablet Take 1 tablet (300 mg total) by mouth every morning. 90 tablet 1  . amLODipine (NORVASC) 5 MG tablet Take 5 mg by mouth 2 (two) times daily.    Marland Kitchen aspirin EC 81 MG tablet Take 81 mg by mouth daily.    Marland Kitchen buPROPion (WELLBUTRIN XL) 150 MG 24 hr tablet Take 1 tablet (150 mg total) by mouth every morning. 90 tablet 1  . cholecalciferol (VITAMIN D) 1000 UNITS tablet Take 1,000 Units by mouth daily.    . clopidogrel (PLAVIX) 75 MG  tablet Take 1 tablet (75 mg total) by mouth daily. 90 tablet 3  . docusate sodium 100 MG CAPS Take 100 mg by mouth 2 (two) times daily. 10 capsule 0  . Ferrous Gluconate 256 (28 FE) MG TABS Take 256 mg by mouth daily.    . furosemide (LASIX) 40 MG tablet Take 1 tablet (40 mg total) by mouth daily. (Patient taking differently: Take 40 mg by mouth 2 (two) times daily. )    .  LORazepam (ATIVAN) 1 MG tablet Take 1 tablet (1 mg total) by mouth every 8 (eight) hours as needed for anxiety or sleep. 90 tablet 3  . Magnesium 250 MG TABS Take 250 mg by mouth daily.     . metoprolol succinate (TOPROL-XL) 50 MG 24 hr tablet Take 1 tablet (50 mg total) by mouth daily. Take with or immediately following a meal. 90 tablet 3  . mirabegron ER (MYRBETRIQ) 50 MG TB24 tablet Take 1 tablet (50 mg total) by mouth daily. 90 tablet 1  . mirtazapine (REMERON) 15 MG tablet Take 1 tablet (15 mg total) by mouth at bedtime. 30 tablet 11  . pantoprazole (PROTONIX) 40 MG tablet Take 1 tablet (40 mg total) by mouth 2 (two) times daily. 90 tablet 1  . pregabalin (LYRICA) 200 MG capsule Take 1 capsule (200 mg total) by mouth at bedtime. 90 capsule 1  . rOPINIRole (REQUIP) 4 MG tablet Take 1 tablet (4 mg total) by mouth at bedtime. 90 tablet 1  . sodium chloride (OCEAN) 0.65 % SOLN nasal spray Place 1 spray into both nostrils as needed for congestion.    . tamsulosin (FLOMAX) 0.4 MG CAPS capsule Take 1 capsule (0.4 mg total) by mouth daily. 90 capsule 3    Home: Home Living Family/patient expects to be discharged to:: Private residence Living Arrangements: Alone Available Help at Discharge: Family, Available PRN/intermittently Type of Home: House Home Access: Ramped entrance Home Layout: One level Home Equipment: Environmental consultant - 2 wheels, Cane - single point, Hospital bed, Bedside commode, Tub bench Additional Comments: pt states equipment was left over from his wife   Functional History: Prior Function Level of Independence:  Independent (per family) Comments: lives alone, son next door; yard work, driving, "he's very independent" Functional Status:  Mobility: Bed Mobility Overal bed mobility: Needs Assistance, +2 for physical assistance Bed Mobility: Rolling, Supine to Sit, Sit to Supine Rolling: Mod assist Supine to sit: Mod assist, +2 for physical assistance, HOB elevated Sit to supine: Max assist, +2 for physical assistance General bed mobility comments: HOB >45 degree to sit up, pt struggles to understand commands, then impulsively activates trunk toward sitting; HOB 0 to return to sup, assist legs back to surface and support trunk to supine; repeated/multi-modal cues to roll to right for off-load positioning Transfers Overall transfer level:  (did not attempt)      ADL:    Cognition: Cognition Overall Cognitive Status: Impaired/Different from baseline Orientation Level: Oriented to person, Disoriented to place, Disoriented to time, Disoriented to situation Cognition Arousal/Alertness: Lethargic Behavior During Therapy: Flat affect Overall Cognitive Status: Impaired/Different from baseline Area of Impairment: Attention, Following commands Current Attention Level: Sustained Following Commands: Follows one step commands with increased time General Comments: slow to process commmands, able to answer orientation questions with some lack of awareness of date, but able to state July.  Hungry  Blood pressure 140/75, pulse 63, temperature 97.6 F (36.4 C), temperature source Oral, resp. rate 20, height 5\' 9"  (1.753 m), weight 104 kg (229 lb 4.5 oz), SpO2 95 %. Physical Exam  Nursing note and vitals reviewed. Constitutional: He is oriented to person, place, and time. He appears well-developed and well-nourished.  HENT:  Head: Normocephalic and atraumatic.  Eyes: Conjunctivae are normal.  Anisocoria   Neck: Neck supple.  Decreased ROM  Cardiovascular: Normal rate.   Murmur heard. Respiratory:  Effort normal. No respiratory distress. He has decreased breath sounds. He has no wheezes. He exhibits no tenderness.  GI: Soft.  Bowel sounds are normal. He exhibits no distension. There is no tenderness.  Musculoskeletal:  Pain with minimal ROM right wrist. Decreased ROM at elbows and wrists. No pain with ROM bilateral shoulders. Keeps BLE rotated outwards. Stasis changes BLE with abrasions left shin.  Dry ulcer right foot.     Neurological: He is alert and oriented to person, place, and time.  Flat affect with delayed processing. Has difficulty following commands. He appears disoriented and did not make eye contact.  Dysphonic and speaks in a whisper.  Diffuse weakness with decreased ROM.   Skin: Skin is warm and dry.  Breakdown right heel, chronic changes in both feet/legs  Psychiatric: His affect is blunt. His speech is delayed. He is slowed and withdrawn.    Results for orders placed or performed during the hospital encounter of 01/09/15 (from the past 24 hour(s))  Glucose, capillary     Status: Abnormal   Collection Time: 01/24/15 12:30 PM  Result Value Ref Range   Glucose-Capillary 139 (H) 65 - 99 mg/dL  Culture, blood (routine x 2)     Status: None (Preliminary result)   Collection Time: 01/24/15  3:00 PM  Result Value Ref Range   Specimen Description BLOOD LEFT ARM    Special Requests BOTTLES DRAWN AEROBIC AND ANAEROBIC 10CC    Culture PENDING    Report Status PENDING   Glucose, capillary     Status: Abnormal   Collection Time: 01/24/15  4:43 PM  Result Value Ref Range   Glucose-Capillary 137 (H) 65 - 99 mg/dL  Glucose, capillary     Status: Abnormal   Collection Time: 01/24/15  9:11 PM  Result Value Ref Range   Glucose-Capillary 129 (H) 65 - 99 mg/dL  Glucose, capillary     Status: Abnormal   Collection Time: 01/25/15 12:15 AM  Result Value Ref Range   Glucose-Capillary 112 (H) 65 - 99 mg/dL  Glucose, capillary     Status: Abnormal   Collection Time: 01/25/15  3:44 AM    Result Value Ref Range   Glucose-Capillary 106 (H) 65 - 99 mg/dL  Basic metabolic panel     Status: Abnormal   Collection Time: 01/25/15  4:36 AM  Result Value Ref Range   Sodium 152 (H) 135 - 145 mmol/L   Potassium 3.7 3.5 - 5.1 mmol/L   Chloride 116 (H) 101 - 111 mmol/L   CO2 29 22 - 32 mmol/L   Glucose, Bld 138 (H) 65 - 99 mg/dL   BUN 40 (H) 6 - 20 mg/dL   Creatinine, Ser 4.69 0.61 - 1.24 mg/dL   Calcium 9.2 8.9 - 62.9 mg/dL   GFR calc non Af Amer 56 (L) >60 mL/min   GFR calc Af Amer >60 >60 mL/min   Anion gap 7 5 - 15  CBC     Status: Abnormal   Collection Time: 01/25/15  4:36 AM  Result Value Ref Range   WBC 8.7 4.0 - 10.5 K/uL   RBC 5.00 4.22 - 5.81 MIL/uL   Hemoglobin 14.4 13.0 - 17.0 g/dL   HCT 52.8 41.3 - 24.4 %   MCV 95.0 78.0 - 100.0 fL   MCH 28.8 26.0 - 34.0 pg   MCHC 30.3 30.0 - 36.0 g/dL   RDW 01.0 (H) 27.2 - 53.6 %   Platelets 161 150 - 400 K/uL  Glucose, capillary     Status: Abnormal   Collection Time: 01/25/15  7:49 AM  Result Value Ref Range   Glucose-Capillary  125 (H) 65 - 99 mg/dL   Dg Chest Port 1 View  01/24/2015   CLINICAL DATA:  Respiratory failure. Coronary disease. Hypertensive heart disease.  EXAM: PORTABLE CHEST - 1 VIEW  COMPARISON:  1 a prior  FINDINGS: Extubation. Removal of nasogastric tube. Midline trachea. Borderline cardiomegaly. No pleural effusion or pneumothorax. Clearing of right base airspace disease. Mild left base atelectasis remains.  IMPRESSION: Extubation and removal of nasogastric tube.  Improved aeration with mild left base atelectasis remaining.   Electronically Signed   By: Jeronimo Greaves M.D.   On: 01/24/2015 09:15    Assessment/Plan: Diagnosis: debility after multiple medical issues above 1. Does the need for close, 24 hr/day medical supervision in concert with the patient's rehab needs make it unreasonable for this patient to be served in a less intensive setting? Yes and Potentially 2. Co-Morbidities requiring  supervision/potential complications: htn, cad, ckd, tophacious gout 3. Due to bladder management, bowel management, safety, skin/wound care, disease management, medication administration, pain management and patient education, does the patient require 24 hr/day rehab nursing? Yes 4. Does the patient require coordinated care of a physician, rehab nurse, PT (1-2 hrs/day, 5 days/week) and OT (1-2 hrs/day, 5 days/week) to address physical and functional deficits in the context of the above medical diagnosis(es)? Yes and Potentially Addressing deficits in the following areas: balance, endurance, locomotion, strength, transferring, bowel/bladder control, bathing, dressing, feeding, grooming, toileting, cognition and psychosocial support 5. Can the patient actively participate in an intensive therapy program of at least 3 hrs of therapy per day at least 5 days per week? Potentially 6. The potential for patient to make measurable gains while on inpatient rehab is good and fair 7. Anticipated functional outcomes upon discharge from inpatient rehab are supervision and min assist  with PT, supervision and min assist with OT, n/a with SLP. 8. Estimated rehab length of stay to reach the above functional goals is: potentially 14-20 days 9. Does the patient have adequate social supports and living environment to accommodate these discharge functional goals? Yes 10. Anticipated D/C setting: Home 11. Anticipated post D/C treatments: HH therapy and Outpatient therapy 12. Overall Rehab/Functional Prognosis: good  RECOMMENDATIONS: This patient's condition is appropriate for continued rehabilitative care in the following setting: see below Patient has agreed to participate in recommended program. Potentially Note that insurance prior authorization may be required for reimbursement for recommended care.  Comment: Pt was extremely sedentary prior to this admission. Will need to display activity tolerance and willingness  to participate in our intensive program. Discussed with son who agrees. Will follow along.  Ranelle Oyster, MD, [facility] Physical Medicine & Rehabilitation 01/25/2015     01/25/2015

## 2015-01-25 NOTE — Progress Notes (Signed)
Rehab Admissions Coordinator Note:  Patient was screened by Trish Mage for appropriateness for an Inpatient Acute Rehab Consult.  At this time, we are recommending Inpatient Rehab consult.  Trish Mage 01/25/2015, 8:48 AM  I can be reached at [phone].

## 2015-01-25 NOTE — Progress Notes (Signed)
Inpatient Rehabilitation  I met with [name] at bedside to discuss his likely post acute rehab needs.  I provided educational booklets about our IP Rehab program and encouraged him to actively participate as much as possible in his upcoming therapy sessions.  Pt. Says he has been to [facility] in the past and might want to consider going there for his rehab recovery.  I will follow up with pt. Tomorrow to further discuss pt's preferences for rehab once medically stable.   Please call if questions.  [place] Admissions Coordinator Cell [phone] Office [phone]

## 2015-01-25 NOTE — Progress Notes (Signed)
Subjective:  Since I last saw him he was extubated over the weekend.  He is calm and alert and knows me and talks lucidly about whether or not to have surgery on his aortic valve.  Not currently short of breath or complaining of chest pain.  Objective:  Vital Signs in the last 24 hours: BP 146/53 mmHg  Pulse 65  Temp(Src) 97.6 F (36.4 C) (Oral)  Resp 18  Ht 5\' 9"  (1.753 m)  Wt 104 kg (229 lb 4.5 oz)  BMI 33.84 kg/m2  SpO2 96%  Physical Exam: Pleasant obese male in no acute distress Lungs:  Clear  Cardiac:  Regular rhythm, normal S1 and S2, no S3, harsh 3/6 systolic murmur Extremities:  No edema present, bandage over left lower extremity, excoriations noted on left lower shin.  Large tophaceous nodule on right knee  Intake/Output from previous day: 07/03 0701 - 07/04 0700 In: 0  Out: 450 [Urine:450] Weight Filed Weights   01/21/15 0500 01/22/15 0500 01/23/15 0418  Weight: 102.8 kg (226 lb 10.1 oz) 103.4 kg (227 lb 15.3 oz) 104 kg (229 lb 4.5 oz)    Lab Results: Basic Metabolic Panel:  Recent Labs  29/51/88 0528 01/25/15 0436  NA 155* 152*  K 3.2* 3.7  CL 111 116*  CO2 34* 29  GLUCOSE 146* 138*  BUN 59* 40*  CREATININE 1.34* 1.19    CBC:  Recent Labs  01/24/15 0528 01/25/15 0436  WBC 9.7 8.7  HGB 14.5 14.4  HCT 47.5 47.5  MCV 96.5 95.0  PLT 174 161    BNP    Component Value Date/Time   BNP 285.0* 01/10/2015 0919   Telemetry: Sinus rhythm  Assessment/Plan:  1.  Coag negative MRSE staph bacteremia in a patient with known aortic stenosis.  I have discussed this with the pulmonary doctors and felt that he should be treated presumptively for endocarditis.  I felt that a TEE would not give [place] any useful information other than to determine if he has a large vegetation.  He is clearly not a great surgical candidate. 2.  Aortic stenosis and moderate previously but could be more severe.  The previous plan had been to consider him for TAVR once he got over  things 3.  Non-STEMI with elevation of troponin-he was to have a catheterization at time of his evaluation about whether he did be a candidate for Tapper or not 4.  History of delirium that has improved 5.  Acute on chronic renal failure due to over diuresis that is resolved.  His weight is up 6.  Hypernatremia  Recommendations:  Clearly not candidate for catheterization at the present time.  He would be a candidate if he continues to clinically improve, no longer has fever.  Prior to any consideration of TAVR he would need to complete a full course of antibodies for endocarditis and then would need to have a discussion about even than when he would be a candidate.  He at the present time does not wish to be reintubated that I agree with.  May restart diuretics once his hypernatremia resolves.  Watch weights carefully.  We'll continue to follow with you.     Darden Palmer  MD [facility]  01/25/2015, 8:11 AM

## 2015-01-25 NOTE — Progress Notes (Signed)
INFECTIOUS DISEASE PROGRESS NOTE  ID: [name] is a 81 y.o. male with  Principal Problem:   Acute respiratory failure with hypoxia Active Problems:   Type 2 diabetes mellitus with vascular disease   Dyslipidemia   Hypertensive heart disease   Aortic stenosis   CAD (coronary artery disease)   CKD (chronic kidney disease), stage III   Elevated troponin   Altered mental status   Abnormal involuntary movements   Acute respiratory failure   Encephalopathy   Endotracheally intubated   Pressure ulcer  Subjective: Resting quietly, without complaints, awakens easily  Abtx:  Anti-infectives    Start     Dose/Rate Route Frequency Ordered Stop   01/25/15 0600  vancomycin (VANCOCIN) 1,500 mg in sodium chloride 0.9 % 500 mL IVPB     1,500 mg 250 mL/hr over 120 Minutes Intravenous Every 24 hours 01/24/15 1132     01/22/15 0600  vancomycin (VANCOCIN) 1,250 mg in sodium chloride 0.9 % 250 mL IVPB  Status:  Discontinued     1,250 mg 166.7 mL/hr over 90 Minutes Intravenous Every 48 hours 01/20/15 0558 01/20/15 1131   01/22/15 0600  vancomycin (VANCOCIN) 1,500 mg in sodium chloride 0.9 % 500 mL IVPB  Status:  Discontinued     1,500 mg 250 mL/hr over 120 Minutes Intravenous Every 48 hours 01/20/15 1131 01/24/15 1132   01/20/15 1300  piperacillin-tazobactam (ZOSYN) IVPB 3.375 g  Status:  Discontinued     3.375 g 12.5 mL/hr over 240 Minutes Intravenous Every 8 hours 01/20/15 1259 01/22/15 1542   01/20/15 0630  vancomycin (VANCOCIN) 1,500 mg in sodium chloride 0.9 % 500 mL IVPB     1,500 mg 250 mL/hr over 120 Minutes Intravenous  Once 01/20/15 0558 01/20/15 0828   01/13/15 1045  cefTRIAXone (ROCEPHIN) 1 g in dextrose 5 % 50 mL IVPB - Premix     1 g 100 mL/hr over 30 Minutes Intravenous Every 24 hours 01/13/15 1038 01/16/15 1126   01/11/15 1000  vancomycin (VANCOCIN) 1,250 mg in sodium chloride 0.9 % 250 mL IVPB  Status:  Discontinued     1,250 mg 166.7 mL/hr over 90 Minutes  Intravenous Every 24 hours 01/10/15 0940 01/13/15 1038   01/10/15 1600  piperacillin-tazobactam (ZOSYN) IVPB 3.375 g  Status:  Discontinued     3.375 g 12.5 mL/hr over 240 Minutes Intravenous Every 8 hours 01/10/15 0940 01/13/15 1038   01/10/15 1000  vancomycin (VANCOCIN) 2,000 mg in sodium chloride 0.9 % 500 mL IVPB     2,000 mg 250 mL/hr over 120 Minutes Intravenous  Once 01/10/15 0927 01/10/15 1218   01/10/15 0930  piperacillin-tazobactam (ZOSYN) IVPB 3.375 g     3.375 g 100 mL/hr over 30 Minutes Intravenous  Once 01/10/15 0927 01/10/15 1048      Medications:  Scheduled: . antiseptic oral rinse  7 mL Mouth Rinse QID  . aspirin  150 mg Rectal Daily  . chlorhexidine  15 mL Mouth Rinse BID  . clopidogrel  75 mg Per Tube Daily  . feeding supplement (PRO-STAT SUGAR FREE 64)  60 mL Per Tube BID  . feeding supplement (VITAL HIGH PROTEIN)  1,000 mL Per Tube Q24H  . heparin subcutaneous  5,000 Units Subcutaneous 3 times per day  . insulin aspart  0-20 Units Subcutaneous 6 times per day  . metoprolol tartrate  12.5 mg Oral BID  . pantoprazole sodium  40 mg Per Tube Daily  . vancomycin  1,500 mg Intravenous Q24H  Objective: Vital signs in last 24 hours: Temp:  [97.6 F (36.4 C)-97.9 F (36.6 C)] 97.6 F (36.4 C) (07/04 0846) Pulse Rate:  [63-70] 63 (07/04 0846) Resp:  [18-20] 20 (07/04 0846) BP: (140-146)/(52-75) 140/75 mmHg (07/04 0846) SpO2:  [95 %-99 %] 95 % (07/04 0846)   General appearance: cooperative and no distress Resp: clear to auscultation bilaterally Cardio: irregularly irregular rhythm and systolic murmur: systolic ejection 4/6, crescendo at 2nd left intercostal space GI: normal findings: bowel sounds normal and soft, non-tender  Lab Results  Recent Labs  01/24/15 0528 01/25/15 0436  WBC 9.7 8.7  HGB 14.5 14.4  HCT 47.5 47.5  NA 155* 152*  K 3.2* 3.7  CL 111 116*  CO2 34* 29  BUN 59* 40*  CREATININE 1.34* 1.19   Liver Panel No results for input(s):  PROT, ALBUMIN, AST, ALT, ALKPHOS, BILITOT, BILIDIR, IBILI in the last 72 hours. Sedimentation Rate No results for input(s): ESRSEDRATE in the last 72 hours. C-Reactive Protein No results for input(s): CRP in the last 72 hours.  Microbiology: Recent Results (from the past 240 hour(s))  Culture, blood (routine x 2)     Status: None   Collection Time: 01/19/15  9:50 AM  Result Value Ref Range Status   Specimen Description BLOOD LEFT HAND  Final   Special Requests BOTTLES DRAWN AEROBIC AND ANAEROBIC  10 CC EACH  Final   Culture  Setup Time   Final    GRAM POSITIVE COCCI IN CLUSTERS IN PAIRS AEROBIC BOTTLE ONLY CRITICAL RESULT CALLED TO, READ BACK BY AND VERIFIED WITH: G HARDUK,RN 6/29 0539 RHOLMES CONFIRMED BY R GREEN    Culture STAPHYLOCOCCUS SPECIES (COAGULASE NEGATIVE)  Final   Report Status 01/24/2015 FINAL  Final   Organism ID, Bacteria STAPHYLOCOCCUS SPECIES (COAGULASE NEGATIVE)  Final      Susceptibility   Staphylococcus species (coagulase negative) - MIC*    CIPROFLOXACIN 4 RESISTANT Resistant     GENTAMICIN <=0.5 SENSITIVE Sensitive     OXACILLIN >=4 RESISTANT Resistant     VANCOMYCIN 2 SENSITIVE Sensitive     TRIMETH/SULFA 160 RESISTANT Resistant     CLINDAMYCIN <=0.25 SENSITIVE Sensitive     RIFAMPIN <=0.5 SENSITIVE Sensitive     Inducible Clindamycin NEGATIVE Sensitive     * STAPHYLOCOCCUS SPECIES (COAGULASE NEGATIVE)  Culture, respiratory (NON-Expectorated)     Status: None   Collection Time: 01/19/15 10:15 AM  Result Value Ref Range Status   Specimen Description TRACHEAL ASPIRATE  Final   Special Requests NONE  Final   Gram Stain   Final    MODERATE WBC PRESENT,BOTH PMN AND MONONUCLEAR NO SQUAMOUS EPITHELIAL CELLS SEEN NO ORGANISMS SEEN Performed at [facility]    Culture   Final    Non-Pathogenic Oropharyngeal-type Flora Isolated. Performed at [facility]    Report Status 01/21/2015 FINAL  Final  Urine culture     Status: None    Collection Time: 01/19/15 10:21 AM  Result Value Ref Range Status   Specimen Description URINE, CATHETERIZED  Final   Special Requests NONE  Final   Culture NO GROWTH 1 DAY  Final   Report Status 01/20/2015 FINAL  Final  Culture, blood (routine x 2)     Status: None   Collection Time: 01/19/15 10:24 AM  Result Value Ref Range Status   Specimen Description BLOOD RIGHT HAND  Final   Special Requests BOTTLES DRAWN AEROBIC AND ANAEROBIC 10 CC EACH  Final   Culture  Setup Time  Final    GRAM POSITIVE COCCI IN CLUSTERS IN PAIRS AEROBIC BOTTLE ONLY CRITICAL RESULT CALLED TO, READ BACK BY AND VERIFIED WITH: G HARDUK,RN 01/20/15 0539 RHOLMES CONFIRMED BY R GREEN    Culture   Final    STAPHYLOCOCCUS SPECIES (COAGULASE NEGATIVE) SUSCEPTIBILITIES PERFORMED ON PREVIOUS CULTURE WITHIN THE LAST 5 DAYS.    Report Status 01/24/2015 FINAL  Final  Clostridium Difficile by PCR (not at [facility])     Status: None   Collection Time: 01/21/15 12:10 PM  Result Value Ref Range Status   C difficile by pcr NEGATIVE NEGATIVE Final  Culture, blood (routine x 2)     Status: None (Preliminary result)   Collection Time: 01/24/15  3:00 PM  Result Value Ref Range Status   Specimen Description BLOOD LEFT ARM  Final   Special Requests BOTTLES DRAWN AEROBIC AND ANAEROBIC 10CC  Final   Culture PENDING  Incomplete   Report Status PENDING  Incomplete    Studies/Results: Dg Chest Port 1 View  01/24/2015   CLINICAL DATA:  Respiratory failure. Coronary disease. Hypertensive heart disease.  EXAM: PORTABLE CHEST - 1 VIEW  COMPARISON:  1 a prior  FINDINGS: Extubation. Removal of nasogastric tube. Midline trachea. Borderline cardiomegaly. No pleural effusion or pneumothorax. Clearing of right base airspace disease. Mild left base atelectasis remains.  IMPRESSION: Extubation and removal of nasogastric tube.  Improved aeration with mild left base atelectasis remaining.   Electronically Signed   By: Jeronimo Greaves M.D.   On: 01/24/2015  09:15     Assessment/Plan: MRSE bacteremia AS DM2- pt denies.  CKD CAD- NSTEMI, prev stent R foot pressure ulcer  Would Continue vanco for 28 days Watch his repeat BCx (so far -) Wound care for his LE wounds Please let us know if needed.  Total days of antibiotics: 6/28 vanco         Johny Sax Infectious Diseases (pager) [phone] [email or web address] 01/25/2015, 2:18 PM  LOS: 16 days

## 2015-01-25 NOTE — Care Management Note (Signed)
Case Management Note  Patient Details  Name: [name] MRN: [id number] Date of Birth: 04/17/1933  Subjective/Objective: 81 y/o M with with recent (6/17) arteriography of RLE for venous ulcers who was admitted 6/18 with a 2 week hx of SOB. Admitted per [facility] for further evaluation. Treated with BiPAP but failed therapy and required intubation am of 6/19; extubated 7/2                Action/Plan: Inpt rehab    Expected Discharge Date:   01/26/15               Expected Discharge Plan:  IP Rehab Facility  In-House Referral:  Clinical Social Work  Discharge planning Services     Post Acute Care Choice:    Choice offered to:     DME Arranged:    DME Agency:     HH Arranged:    HH Agency:     Status of Service:     Medicare Important Message Given:  Yes-second notification given Date Medicare IM Given:    Medicare IM give by:    Date Additional Medicare IM Given:    Additional Medicare Important Message give by:     If discussed at Long Length of Stay Meetings, dates discussed:    Additional Comments: spoke with Darl Pikes, PT, she stated that the d/c plan is Inpt rehab vs. SNF. Pt has been at [facility], SNF in the past. She spoke to the unit SW regarding SNF.   [name], RN 01/25/2015, 4:20 PM

## 2015-01-25 NOTE — Progress Notes (Signed)
MBSS complete. Full report located under chart review in imaging section.  Yonna Alwin Paiewonsky, M.A. CCC-SLP [phone]  

## 2015-01-25 NOTE — Progress Notes (Signed)
Patient Demographics:    [name], is a 81 y.o. male, DOB - [birth year], ZOX:[id number]  Admit date - 01/09/2015   Admitting Physician Hillary Bow, DO  Outpatient Primary MD for the patient is [name], MD  LOS - 16   Assumed care of the patient on 01/24/2015 on day 15 of hospital stay. Pink transferred from critical care service.   Summary  [name] is a 81 y.o. male with pmh of CAD, DM, CKD, AS, he also has a right foot ulcer for the last few months for which he is getting outpatient wound care, Patient presents to the ED with c/o intermittent, ongoing, and unchanged SOB x2 weeks. Worsened today. SOB is worsened with mild exertion. He has aortic stenosis for a long time and has had exertional dyspnea for several months, he follows with Dr. Donnie Aho who is his cardiologist. He was admitted for acute respiratory failure secondary to possible critical left ear induced left-sided heart failure with pulmonary edema, possible HCAP, he also developed non-ST elevation MI , he was seen by cardiology, he was tried on BiPAP initially however he continued to get worse and was intubated and transferred to pulmonary critical care.   In the hospital he developed's coag-negative staph bacteremia, source is unclear, TTE did not show any vegetation and he remains too frail for a TEE. He was transferred under hospitalist care on day 15 of his hospital stay I'll resume his care on 01/24/2015.    Chief Complaint  Patient presents with  . Shortness of Breath        Subjective:    [name] today has, No headache, No chest pain, No abdominal pain - No Nausea, No new weakness tingling or numbness, No Cough - SOB.     Assessment  & Plan :    1. Acute on chronic hypoxic respiratory failure  in the setting of moderate to severe aortic stenosis. Non-ST elevation MI. Pulmonary edema + ? HCAP - was intubated and was under the care of ICU physicians till 01/23/2015, was extubated 01/23/2015. Currently on room air with nonacute chest x-ray, has finished anti-biotics for HCAP, on aspirin, beta blocker along with Plavix for his non-ST elevation MI. Cardiology following. Cardiology decide whether patient is a candidate for left heart cath or not in the light of his ongoing bacteremia.   2. Coag negative bacteremia. Source unclear. Does have a chronic wound on the right foot. Also aortic stenosis, currently on vancomycin. ID input appreciated. Poor candidate for TEE. TTE does not show any frank vegetation. Per ID treat with IV vancomycin for a total of 1 month.    3. Dysphagia postextubation. Currently nothing by mouth speech following, do for med of fibroid barium swallow on 01/25/2015.   4. Hyponatremia with dehydration and acute renal failure. Hydrate with D5W monitor BMP. Mild improvement.   5. Hypokalemia. Replaced and monitor.   6. Non-ST elevation MI. Cardiology following, currently chest pain-free, on aspirin-Plavix-beta blocker as tolerated orally, while nothing by mouth Will give aspirin suppository. Further management per cardiology.   7. History of gout. Resume allopurinol once taking medications orally.   8. GERD. Since nothing by mouth we'll switch to IV PPI for now.   9. Essential hypertension. As  needed IV hydralazine. Oral medications once tolerated.      Code Status : No Intubation  Family Communication  : daughter in detail 01-23-15, family bedside  Disposition Plan  : TBD  Consults  :  PCCM, Cards, ID  Procedures  :    6/18 CT ABD >> post-op changes in L groin, increased density in bladder, bibasilar atx 6/18 ECHO >> LVEF 55-60%, grade II diastolic dysfunction, moderate AS (wosening since last ECHO) 6/20 EEG>>>metabolic vs drug affect, no focus    6/19 CT of Head >> Stable moderate diffuse cerebral atrophy and mild chronic small vessel white matter ischemic changes 6/20 Echo: Left ventricle: The cavity size was normal. Wall thickness was normal. Systolic function was normal. The estimated ejection fraction was in the range of 60% to 65%. Wall motion was normal;there were no regional wall motion abnormalities. 6/22: MR Brain: No infarct, age related atrophy, mild chronic small vessel disease  SIGNIFICANT EVENTS: 6/17 RLE arteriography per Dr. Edilia Bo for PVD and non-healing R toe wound 6/18 Admit to [facility] with complaints of SOB x 2 weeks 6/19 Decompensated early am, required intubation / ICU transfer 6/20-trop elevation, hep , asa 6/21: polymyoclonus with when precedex stopped 6/22: continued plt count drop to 93 down from 131,agitation required change in sedation to prop 6/23: worsening chest film, plt stable 7/2>> staph bacteremia  7/04//2016. Modified barium swallow ordered  DVT Prophylaxis  :  Heparin    Lab Results  Component Value Date   PLT 161 01/25/2015    Inpatient Medications  Scheduled Meds: . antiseptic oral rinse  7 mL Mouth Rinse QID  . aspirin  150 mg Rectal Daily  . chlorhexidine  15 mL Mouth Rinse BID  . clopidogrel  75 mg Per Tube Daily  . feeding supplement (PRO-STAT SUGAR FREE 64)  60 mL Per Tube BID  . feeding supplement (VITAL HIGH PROTEIN)  1,000 mL Per Tube Q24H  . heparin subcutaneous  5,000 Units Subcutaneous 3 times per day  . insulin aspart  0-20 Units Subcutaneous 6 times per day  . metoprolol tartrate  12.5 mg Oral BID  . pantoprazole sodium  40 mg Per Tube Daily  . vancomycin  1,500 mg Intravenous Q24H   Continuous Infusions: . dextrose 110 mL/hr at 01/25/15 1024   PRN Meds:.albuterol, fentaNYL, hydrALAZINE, metoprolol, sodium chloride  Antibiotics  :     Anti-infectives    Start     Dose/Rate Route Frequency Ordered Stop   01/25/15 0600  vancomycin (VANCOCIN) 1,500 mg in sodium  chloride 0.9 % 500 mL IVPB     1,500 mg 250 mL/hr over 120 Minutes Intravenous Every 24 hours 01/24/15 1132     01/22/15 0600  vancomycin (VANCOCIN) 1,250 mg in sodium chloride 0.9 % 250 mL IVPB  Status:  Discontinued     1,250 mg 166.7 mL/hr over 90 Minutes Intravenous Every 48 hours 01/20/15 0558 01/20/15 1131   01/22/15 0600  vancomycin (VANCOCIN) 1,500 mg in sodium chloride 0.9 % 500 mL IVPB  Status:  Discontinued     1,500 mg 250 mL/hr over 120 Minutes Intravenous Every 48 hours 01/20/15 1131 01/24/15 1132   01/20/15 1300  piperacillin-tazobactam (ZOSYN) IVPB 3.375 g  Status:  Discontinued     3.375 g 12.5 mL/hr over 240 Minutes Intravenous Every 8 hours 01/20/15 1259 01/22/15 1542   01/20/15 0630  vancomycin (VANCOCIN) 1,500 mg in sodium chloride 0.9 % 500 mL IVPB     1,500 mg 250 mL/hr over 120  Minutes Intravenous  Once 01/20/15 0558 01/20/15 0828   01/13/15 1045  cefTRIAXone (ROCEPHIN) 1 g in dextrose 5 % 50 mL IVPB - Premix     1 g 100 mL/hr over 30 Minutes Intravenous Every 24 hours 01/13/15 1038 01/16/15 1126   01/11/15 1000  vancomycin (VANCOCIN) 1,250 mg in sodium chloride 0.9 % 250 mL IVPB  Status:  Discontinued     1,250 mg 166.7 mL/hr over 90 Minutes Intravenous Every 24 hours 01/10/15 0940 01/13/15 1038   01/10/15 1600  piperacillin-tazobactam (ZOSYN) IVPB 3.375 g  Status:  Discontinued     3.375 g 12.5 mL/hr over 240 Minutes Intravenous Every 8 hours 01/10/15 0940 01/13/15 1038   01/10/15 1000  vancomycin (VANCOCIN) 2,000 mg in sodium chloride 0.9 % 500 mL IVPB     2,000 mg 250 mL/hr over 120 Minutes Intravenous  Once 01/10/15 0927 01/10/15 1218   01/10/15 0930  piperacillin-tazobactam (ZOSYN) IVPB 3.375 g     3.375 g 100 mL/hr over 30 Minutes Intravenous  Once 01/10/15 0927 01/10/15 1048        Objective:   Filed Vitals:   01/24/15 1011 01/24/15 2115 01/25/15 0352 01/25/15 0846  BP: 108/57 145/52 146/53 140/75  Pulse: 78 70 65 63  Temp: 98.8 F (37.1 C) 97.9  F (36.6 C) 97.6 F (36.4 C) 97.6 F (36.4 C)  TempSrc: Oral Oral Oral Oral  Resp: 22 20 18 20   Height:      Weight:      SpO2: 98% 99% 96% 95%    Wt Readings from Last 3 Encounters:  01/23/15 104 kg (229 lb 4.5 oz)  01/08/15 108.863 kg (240 lb)  12/23/14 112.447 kg (247 lb 14.4 oz)     Intake/Output Summary (Last 24 hours) at 01/25/15 1213 Last data filed at 01/25/15 0806  Gross per 24 hour  Intake      0 ml  Output    450 ml  Net   -450 ml     Physical Exam  Awake Alert, Oriented X 3, appears weak,No new F.N deficits, Normal affect [place].AT,PERRAL Supple Neck,No JVD, No cervical lymphadenopathy appriciated.  Symmetrical Chest wall movement, Good air movement bilaterally, Coarse B. sounds RRR,No Gallops,Rubs or new Murmurs, No Parasternal Heave +ve B.Sounds, Abd Soft, No tenderness, No organomegaly appriciated, No rebound - guarding or rigidity. No Cyanosis, Clubbing or edema, No new Rash or bruise       Data Review:   Micro Results Recent Results (from the past 240 hour(s))  Culture, blood (routine x 2)     Status: None   Collection Time: 01/19/15  9:50 AM  Result Value Ref Range Status   Specimen Description BLOOD LEFT HAND  Final   Special Requests BOTTLES DRAWN AEROBIC AND ANAEROBIC  10 CC EACH  Final   Culture  Setup Time   Final    GRAM POSITIVE COCCI IN CLUSTERS IN PAIRS AEROBIC BOTTLE ONLY CRITICAL RESULT CALLED TO, READ BACK BY AND VERIFIED WITH: G HARDUK,RN 6/29 0539 RHOLMES CONFIRMED BY R GREEN    Culture STAPHYLOCOCCUS SPECIES (COAGULASE NEGATIVE)  Final   Report Status 01/24/2015 FINAL  Final   Organism ID, Bacteria STAPHYLOCOCCUS SPECIES (COAGULASE NEGATIVE)  Final      Susceptibility   Staphylococcus species (coagulase negative) - MIC*    CIPROFLOXACIN 4 RESISTANT Resistant     GENTAMICIN <=0.5 SENSITIVE Sensitive     OXACILLIN >=4 RESISTANT Resistant     VANCOMYCIN 2 SENSITIVE Sensitive  TRIMETH/SULFA 160 RESISTANT Resistant      CLINDAMYCIN <=0.25 SENSITIVE Sensitive     RIFAMPIN <=0.5 SENSITIVE Sensitive     Inducible Clindamycin NEGATIVE Sensitive     * STAPHYLOCOCCUS SPECIES (COAGULASE NEGATIVE)  Culture, respiratory (NON-Expectorated)     Status: None   Collection Time: 01/19/15 10:15 AM  Result Value Ref Range Status   Specimen Description TRACHEAL ASPIRATE  Final   Special Requests NONE  Final   Gram Stain   Final    MODERATE WBC PRESENT,BOTH PMN AND MONONUCLEAR NO SQUAMOUS EPITHELIAL CELLS SEEN NO ORGANISMS SEEN Performed at [facility]    Culture   Final    Non-Pathogenic Oropharyngeal-type Flora Isolated. Performed at [facility]    Report Status 01/21/2015 FINAL  Final  Urine culture     Status: None   Collection Time: 01/19/15 10:21 AM  Result Value Ref Range Status   Specimen Description URINE, CATHETERIZED  Final   Special Requests NONE  Final   Culture NO GROWTH 1 DAY  Final   Report Status 01/20/2015 FINAL  Final  Culture, blood (routine x 2)     Status: None   Collection Time: 01/19/15 10:24 AM  Result Value Ref Range Status   Specimen Description BLOOD RIGHT HAND  Final   Special Requests BOTTLES DRAWN AEROBIC AND ANAEROBIC 10 CC EACH  Final   Culture  Setup Time   Final    GRAM POSITIVE COCCI IN CLUSTERS IN PAIRS AEROBIC BOTTLE ONLY CRITICAL RESULT CALLED TO, READ BACK BY AND VERIFIED WITH: G HARDUK,RN 01/20/15 0539 RHOLMES CONFIRMED BY R GREEN    Culture   Final    STAPHYLOCOCCUS SPECIES (COAGULASE NEGATIVE) SUSCEPTIBILITIES PERFORMED ON PREVIOUS CULTURE WITHIN THE LAST 5 DAYS.    Report Status 01/24/2015 FINAL  Final  Clostridium Difficile by PCR (not at [facility])     Status: None   Collection Time: 01/21/15 12:10 PM  Result Value Ref Range Status   C difficile by pcr NEGATIVE NEGATIVE Final  Culture, blood (routine x 2)     Status: None (Preliminary result)   Collection Time: 01/24/15  3:00 PM  Result Value Ref Range Status   Specimen Description BLOOD LEFT  ARM  Final   Special Requests BOTTLES DRAWN AEROBIC AND ANAEROBIC 10CC  Final   Culture PENDING  Incomplete   Report Status PENDING  Incomplete    Radiology Reports   Ct Head Wo Contrast  01/10/2015   CLINICAL DATA:  Altered mental status.  EXAM: CT HEAD WITHOUT CONTRAST  TECHNIQUE: Contiguous axial images were obtained from the base of the skull through the vertex without intravenous contrast.  COMPARISON:  07/13/2014.  FINDINGS: Diffusely enlarged ventricles and subarachnoid spaces. Patchy white matter low density in both cerebral hemispheres. Stable small, more focal area of low density in the left frontoparietal white matter. No intracranial hemorrhage, mass lesion or CT evidence of acute infarction. Unremarkable bones and included paranasal sinuses.  IMPRESSION: 1. No acute abnormality. 2. Stable moderate diffuse cerebral atrophy and mild chronic small vessel white matter ischemic changes in both cerebral hemispheres. 3. 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   Electronically Signed   By: Beckie Salts M.D.   On: 01/10/2015 12:34   Mr Brain Wo Contrast  01/14/2015   CLINICAL DATA:  Initial evaluation for acute encephalopathy.  EXAM: MRI HEAD WITHOUT CONTRAST  TECHNIQUE: Multiplanar, multiecho pulse sequences of the brain and surrounding structures were obtained without intravenous contrast.  COMPARISON:  Prior CT from 01/10/2015  FINDINGS: Diffuse prominence of the CSF containing spaces is compatible with generalized cerebral atrophy. There are scattered patchy T2/FLAIR hyperintense foci within the periventricular white matter, nonspecific, but likely related to chronic small vessel ischemic disease. These are similar relative to prior MRI.  No abnormal foci of restricted diffusion to suggest acute intracranial infarct. Gray-white matter differentiation maintained. Normal intravascular flow voids are preserved. No acute or chronic intracranial hemorrhage.  No mass lesion, mass  effect, or midline shift. No hydrocephalus. No extra-axial fluid collection. Hippocampi symmetric in size with normal morphology and signal intensity.  Craniocervical junction within normal limits. Pituitary gland normal.  No acute abnormality about the orbits. Sequela prior bilateral lens extraction noted.  Fluid density present within the nasopharynx, likely related intubation. Scattered mucosal thickening present within the sphenoid sinuses and ethmoidal air cells. Bilateral mastoid effusions present. Inner ear structures normal.  Scattered degenerative changes present within the visualized upper cervical spine. Bone marrow signal intensity within normal limits. Scalp soft tissues unremarkable.  IMPRESSION: 1. No acute intracranial infarct or other process identified. 2. Generalized age-related cerebral atrophy with mild chronic small vessel ischemic disease.   Electronically Signed   By: Rise Mu M.D.   On: 01/14/2015 04:30   Ct Abdomen Pelvis W Contrast  01/09/2015   CLINICAL DATA:  Intermittent ongoing shortness of breath for 2 weeks, worse today. Abdominal pain for a few days with nausea. Recent discharge from hospital early this morning after stent placed in the lower extremity.  EXAM: CT ABDOMEN AND PELVIS WITH CONTRAST  TECHNIQUE: Multidetector CT imaging of the abdomen and pelvis was performed using the standard protocol following bolus administration of intravenous contrast.  CONTRAST:  80mL OMNIPAQUE IOHEXOL 300 MG/ML  SOLN  COMPARISON:  None.  FINDINGS: Atelectasis in the lung bases. Coronary artery and aortic valve calcifications.  Mild diffuse fatty infiltration of the liver. No focal liver lesions. The gallbladder, spleen, pancreas, adrenal glands, kidneys, inferior vena cava, and retroperitoneal lymph nodes are unremarkable. Calcification of the aorta without aneurysm. Calcification of the renal artery origins. Stomach, small bowel, and colon are not abnormally distended. No discrete  wall thickening is noted. No free air or free fluid in the abdomen. Abdominal wall musculature appears intact.  Pelvis: The appendix is normal. Prostate gland is enlarged. There is increased density in the bladder which could be early contrast or may indicate hemorrhage. Correlation with urinalysis is recommended. No bladder wall thickening. Mildly enlarged lymph node in the left external iliac chain measuring 16 mm diameter and in the right iliac chain measuring 13 mm diameter, probably reactive. Skin defect and infiltration in the left groin is probably postoperative. Small right inguinal hernia containing fat. Degenerative changes in the spine. No destructive bone lesions. Postoperative laminectomies at L3-4, L4-5, and L5-S1.  IMPRESSION: Postoperative changes in the left groin region with infiltration in the subcutaneous fat, likely postoperative. Prominent lymph nodes in the iliac chains bilaterally are most likely to be reactive. Increased density in the bladder suggest possible hemorrhage. Correlation with urinalysis. Right inguinal hernia containing fat.   Electronically Signed   By: Burman Nieves M.D.   On: 01/09/2015 04:16   Dg Chest Port 1 View  01/24/2015   CLINICAL DATA:  Respiratory failure. Coronary disease. Hypertensive heart disease.  EXAM: PORTABLE CHEST - 1 VIEW  COMPARISON:  1 a prior  FINDINGS: Extubation. Removal of nasogastric tube. Midline trachea. Borderline cardiomegaly. No pleural effusion or pneumothorax. Clearing of right base airspace  disease. Mild left base atelectasis remains.  IMPRESSION: Extubation and removal of nasogastric tube.  Improved aeration with mild left base atelectasis remaining.   Electronically Signed   By: Jeronimo Greaves M.D.   On: 01/24/2015 09:15   Dg Chest Port 1 View  01/23/2015   CLINICAL DATA:  Acute respiratory failure  EXAM: PORTABLE CHEST - 1 VIEW  COMPARISON:  01/21/2015  FINDINGS: Endotracheal tube is 5-6 cm above the carina, slightly retracted since  prior study. NG tube enters the stomach. Bibasilar airspace opacities are noted, left greater than right, likely atelectasis. No significant change since prior study. Heart is upper limits normal in size. No significant visible effusions.  IMPRESSION: Continued bibasilar opacities, likely atelectasis, left greater than right.   Electronically Signed   By: Charlett Nose M.D.   On: 01/23/2015 08:35        CBC  Recent Labs Lab 01/20/15 0512 01/21/15 0231 01/23/15 0219 01/24/15 0528 01/25/15 0436  WBC 10.9* 11.5* 12.0* 9.7 8.7  HGB 16.3 15.5 14.9 14.5 14.4  HCT 52.8* 51.1 49.5 47.5 47.5  PLT 227 211 191 174 161  MCV 95.5 96.4 97.2 96.5 95.0  MCH 29.5 29.2 29.3 29.5 28.8  MCHC 30.9 30.3 30.1 30.5 30.3  RDW 16.5* 16.8* 16.5* 15.9* 15.6*    Chemistries   Recent Labs Lab 01/19/15 1830 01/20/15 0512 01/21/15 0231  01/22/15 1215 01/23/15 0219 01/23/15 1732 01/24/15 0528 01/25/15 0436  NA 153* 154* 155*  < > 158* 154* 153* 155* 152*  K 3.5 3.4* 3.2*  < > 3.2* 2.9* 3.3* 3.2* 3.7  CL 103 101 105  < > 111 110 113* 111 116*  CO2 40* 38* 36*  < > 35* 34* 32 34* 29  GLUCOSE 182* 177* 204*  < > 208* 165* 163* 146* 138*  BUN 113* 114* 127*  < > 114* 98* 79* 59* 40*  CREATININE 2.09* 2.22* 2.71*  < > 2.24* 1.80* 1.46* 1.34* 1.19  CALCIUM 9.4 9.7 9.4  < > 9.4 9.1 9.2 9.7 9.2  MG  --  3.4* 3.4*  --   --  3.2*  --   --   --   AST 76*  --   --   --   --   --   --   --   --   ALT 34  --   --   --   --   --   --   --   --   ALKPHOS 51  --   --   --   --   --   --   --   --   BILITOT 0.7  --   --   --   --   --   --   --   --   < > = values in this interval not displayed. ------------------------------------------------------------------------------------------------------------------ estimated creatinine clearance is 59.8 mL/min (by C-G formula based on Cr of 1.19). ------------------------------------------------------------------------------------------------------------------ No results  for input(s): HGBA1C in the last 72 hours. ------------------------------------------------------------------------------------------------------------------ No results for input(s): CHOL, HDL, LDLCALC, TRIG, CHOLHDL, LDLDIRECT in the last 72 hours. ------------------------------------------------------------------------------------------------------------------ No results for input(s): TSH, T4TOTAL, T3FREE, THYROIDAB in the last 72 hours.  Invalid input(s): FREET3 ------------------------------------------------------------------------------------------------------------------ No results for input(s): VITAMINB12, FOLATE, FERRITIN, TIBC, IRON, RETICCTPCT in the last 72 hours.  Coagulation profile No results for input(s): INR, PROTIME in the last 168 hours.  No results for input(s): DDIMER in the last 72 hours.  Cardiac Enzymes  Recent Labs  Lab 01/19/15 1830 01/19/15 2041 01/20/15 0512 01/20/15 1435  CKMB 2.6  --  1.5  --   TROPONINI 0.41* 0.49* 0.54* 0.42*   ------------------------------------------------------------------------------------------------------------------ Invalid input(s): POCBNP   Time Spent in minutes   35   SINGH,PRASHANT K M.D on 01/25/2015 at 12:13 PM  Between 7am to 7pm - Pager - [phone]  After 7pm go to [email or web address] - password [facility]  [facility]   Office  [phone]

## 2015-01-26 LAB — GLUCOSE, CAPILLARY
GLUCOSE-CAPILLARY: 126 mg/dL — AB (ref 65–99)
GLUCOSE-CAPILLARY: 123 mg/dL — AB (ref 65–99)
Glucose-Capillary: 116 mg/dL — ABNORMAL HIGH (ref 65–99)
Glucose-Capillary: 142 mg/dL — ABNORMAL HIGH (ref 65–99)
Glucose-Capillary: 112 mg/dL — ABNORMAL HIGH (ref 65–99)

## 2015-01-26 LAB — BASIC METABOLIC PANEL
ANION GAP: 7 (ref 5–15)
BUN: 25 mg/dL — ABNORMAL HIGH (ref 6–20)
CO2: 28 mmol/L (ref 22–32)
Calcium: 9 mg/dL (ref 8.9–10.3)
Chloride: 111 mmol/L (ref 101–111)
Creatinine, Ser: 1.06 mg/dL (ref 0.61–1.24)
GLUCOSE: 127 mg/dL — AB (ref 65–99)
Potassium: 3.6 mmol/L (ref 3.5–5.1)
SODIUM: 146 mmol/L — AB (ref 135–145)

## 2015-01-26 MED ORDER — TRAMADOL HCL 50 MG PO TABS
50.0000 mg | ORAL_TABLET | Freq: Four times a day (QID) | ORAL | Status: DC | PRN
  Administered 2015-01-26: 50 mg via ORAL
  Filled 2015-01-26: qty 1

## 2015-01-26 MED ORDER — PANTOPRAZOLE SODIUM 40 MG PO TBEC
40.0000 mg | DELAYED_RELEASE_TABLET | Freq: Every day | ORAL | Status: DC
  Administered 2015-01-26 – 2015-01-28 (×3): 40 mg via ORAL
  Filled 2015-01-26 (×4): qty 1

## 2015-01-26 MED ORDER — SODIUM CHLORIDE 0.9 % IJ SOLN
10.0000 mL | INTRAMUSCULAR | Status: DC | PRN
  Administered 2015-01-27: 10 mL
  Filled 2015-01-26: qty 40

## 2015-01-26 MED ORDER — ASPIRIN 81 MG PO CHEW
81.0000 mg | CHEWABLE_TABLET | Freq: Every day | ORAL | Status: DC
  Administered 2015-01-26 – 2015-01-28 (×3): 81 mg via ORAL
  Filled 2015-01-26 (×3): qty 1

## 2015-01-26 NOTE — Progress Notes (Signed)
Speech Language Pathology Treatment: Dysphagia  Patient Details Name: [name] MRN: [id number] DOB: 05-Nov-1932 Today's Date: 01/26/2015 Time: 3545-6256 SLP Time Calculation (min) (ACUTE ONLY): 9 min  Assessment / Plan / Recommendation Clinical Impression  Pt requires encouragement for PO intake as he recently finished his lunch, but is agreeable to sips of nectar thick liquids. No overt signs of aspiration observed, although pt requires Min-Mod cues for use of recommended swallowing strategies. Total A was provided for recall of results/recommendations from [facility] on previous date. Would continue current textures at this time.   HPI Other Pertinent Information: [name] is a 81 y.o. male with pmh of CAD,GERD, TIA, anxiety, depression,  DM, CKD, AS, he also has a right foot ulcer for the last few months for which he is getting outpatient wound care, Patient presents to the ED with c/o intermittent, ongoing, and unchanged SOB x2 weeks, possible heart failure with pulmonary edema, possible HCAP. No acute abnormalities noted on initial CXR. Also developed non-STEMI. Tried on Bipap initially however continued to worsen and was intubated 6/19 -7/2. Neurology consulted following decline due to noted jerking lingual movements however head CT negative.    Pertinent Vitals Pain Assessment: No/denies pain  SLP Plan  Continue with current plan of care    Recommendations Diet recommendations: Dysphagia 2 (fine chop);Nectar-thick liquid Liquids provided via: Cup;Straw Medication Administration: Whole meds with puree Supervision: Staff to assist with self feeding;Full supervision/cueing for compensatory strategies Compensations: Slow rate;Small sips/bites;Multiple dry swallows after each bite/sip Postural Changes and/or Swallow Maneuvers: Seated upright 90 degrees;Upright 30-60 min after meal       Oral Care Recommendations: Oral care BID;Other (Comment) (prior to ice chips) Plan: Continue with  current plan of care    [name], M.A. CCC-SLP [phone]  [name] 01/26/2015, 1:57 PM

## 2015-01-26 NOTE — Progress Notes (Signed)
Physical Therapy Treatment Patient Details Name: [name] MRN: [id number] DOB: [birth year] Today's Date: 01/26/2015    History of Present Illness 81 yo male with onset of generalized weakness after episode of ARF.      PT Comments    Pt able to assist with standing today first at [facility] then with PT and nursing supporting UE's and using bed pad under hips.  First plan was to stand with walker but not stable enough to safely sidestep to chair.  He is planning to go to CIR, very appropriate as he has been in bed and unable to move > week.  Follow Up Recommendations  CIR     Equipment Recommendations  None recommended by PT    Recommendations for Other Services Rehab consult     Precautions / Restrictions Precautions Precautions: Fall Precaution Comments: nursing decided pt could be in chair without alarm Restrictions Weight Bearing Restrictions: No    Mobility  Bed Mobility Overal bed mobility: Needs Assistance;+2 for physical assistance;+ 2 for safety/equipment Bed Mobility: Rolling;Supine to Sit Rolling: Mod assist   Supine to sit: Max assist;+2 for physical assistance;+2 for safety/equipment     General bed mobility comments: elevated HOB and assist with pt under trunk and to swing legs out  Transfers Overall transfer level: Needs assistance Equipment used: Rolling walker (2 wheeled);2 person hand held assist Transfers: Sit to/from [employer] Sit to Stand: Max assist;+2 physical assistance;+2 safety/equipment Stand pivot transfers: Mod assist;+2 physical assistance;+2 safety/equipment          Ambulation/Gait             General Gait Details: unable to do more than transition to chair   Stairs            Wheelchair Mobility    Modified Rankin (Stroke Patients Only)       Balance Overall balance assessment: Needs assistance Sitting-balance support: Feet supported;Bilateral upper extremity supported Sitting  balance-Leahy Scale: Fair Sitting balance - Comments: leans backward and has difficulty catching LOB Postural control: Posterior lean Standing balance support: Bilateral upper extremity supported;During functional activity (Used support under hips) Standing balance-Leahy Scale: Poor                      Cognition Arousal/Alertness: Lethargic Behavior During Therapy: Flat affect Overall Cognitive Status: History of cognitive impairments - at baseline Area of Impairment: Attention;Following commands;Safety/judgement;Awareness;Problem solving;Memory   Current Attention Level: Alternating Memory: Decreased short-term memory Following Commands: Follows one step commands inconsistently Safety/Judgement: Decreased awareness of safety;Decreased awareness of deficits Awareness: Intellectual Problem Solving: Decreased initiation;Difficulty sequencing;Requires verbal cues      Exercises      General Comments General comments (skin integrity, edema, etc.): Pt was up in chair after PT and nursing sat pt up and  did practice stand at [facility], which was not steady enough to do to chair.  He was assisted with UE's and with bed pad under hips to chair, good control of standing with hip support.      Pertinent Vitals/Pain Pain Assessment: No/denies pain    Home Living                      Prior Function            PT Goals (current goals can now be found in the care plan section) Acute Rehab PT Goals Patient Stated Goal: sit up  PT Goal Formulation: With patient/family Progress towards PT goals: Progressing toward  goals    Frequency  Min 3X/week    PT Plan Current plan remains appropriate    Co-evaluation             End of Session Equipment Utilized During Treatment: Oxygen Activity Tolerance: Patient limited by fatigue;Patient limited by lethargy Patient left: in chair;with call bell/phone within reach;with nursing/sitter in room     Time: 1131-1158 PT Time  Calculation (min) (ACUTE ONLY): 27 min  Charges:  $Therapeutic Activity: 23-37 mins                    G Codes:      [name] February 17, 2015, 1:57 PM   Samul Dada, PT MS Acute Rehab Dept. Number: [id number] and [facility] [phone]

## 2015-01-26 NOTE — Progress Notes (Signed)
Patient Demographics:    [name], is a 81 y.o. male, DOB - 1932-12-08, ZOX:[id number]  Admit date - 01/09/2015   Admitting Physician Hillary Bow, DO  Outpatient Primary MD for the patient is [name], MD  LOS - 17   Assumed care of the patient on 01/24/2015 on day 15 of hospital stay. Pink transferred from critical care service.   Summary  [name] is a 81 y.o. male with pmh of CAD, DM, CKD, AS, he also has a right foot ulcer for the last few months for which he is getting outpatient wound care, Patient presents to the ED with c/o intermittent, ongoing, and unchanged SOB x2 weeks. Worsened today. SOB is worsened with mild exertion. He has aortic stenosis for a long time and has had exertional dyspnea for several months, he follows with Dr. Donnie Aho who is his cardiologist. He was admitted for acute respiratory failure secondary to possible critical left ear induced left-sided heart failure with pulmonary edema, possible HCAP, he also developed non-ST elevation MI , he was seen by cardiology, he was tried on BiPAP initially however he continued to get worse and was intubated and transferred to pulmonary critical care.   In the hospital he developed's coag-negative staph bacteremia, source is unclear, TTE did not show any vegetation and he remains too frail for a TEE. He was transferred under hospitalist care on day 15 of his hospital stay I'll resume his care on 01/24/2015.    Chief Complaint  Patient presents with  . Shortness of Breath        Subjective:    [name] today has, No headache, No chest pain, No abdominal pain - No Nausea, No new weakness tingling or numbness, No Cough - SOB.     Assessment  & Plan :    1. Acute on chronic hypoxic respiratory failure  in the setting of moderate to severe aortic stenosis. Non-ST elevation MI. Pulmonary edema + ? HCAP - was intubated and was under the care of ICU physicians till 01/23/2015, was extubated 01/23/2015. Currently on room air with nonacute chest x-ray, has finished anti-biotics for HCAP, on aspirin, beta blocker along with Plavix for his non-ST elevation MI. Cardiology following. Cardiology to decide whether patient is a candidate for left heart cath or not in the light of his ongoing bacteremia.   2. Coag negative bacteremia. Source unclear. Does have a chronic wound on the right foot. Also aortic stenosis, currently on vancomycin. ID input appreciated. Poor candidate for TEE. TTE does not show any frank vegetation. Per ID treat with IV vancomycin for a total of 1 month, start date 01-19-15.    3. Dysphagia postextubation. D2 Nectar thick diet, had barium swallow on 01/25/2015.Speech following.   4. Hyponatremia with dehydration and acute renal failure. Hydrate with D5W monitor BMP. Improving.   5. Hypokalemia. Replaced and monitor.   6. Non-ST elevation MI. Cardiology following, currently chest pain-free, on aspirin-Plavix-beta blocker as tolerated orally. Further management per cardiology.   7. History of gout. Resumed allopurinol.   8. GERD. On PPI   9. Essential hypertension. As needed IV hydralazine.        Code Status : No Intubation  Family Communication  : daughter in detail  01-23-15, family bedside  Disposition Plan  : TBD  Consults  :  PCCM, Cards, ID  Procedures  :    6/18 CT ABD >> post-op changes in L groin, increased density in bladder, bibasilar atx 6/18 ECHO >> LVEF 55-60%, grade II diastolic dysfunction, moderate AS (wosening since last ECHO) 6/20 EEG>>>metabolic vs drug affect, no focus  6/19 CT of Head >> Stable moderate diffuse cerebral atrophy and mild chronic small vessel white matter ischemic changes 6/20 Echo: Left ventricle: The cavity size was  normal. Wall thickness was normal. Systolic function was normal. The estimated ejection fraction was in the range of 60% to 65%. Wall motion was normal;there were no regional wall motion abnormalities. 6/22: MR Brain: No infarct, age related atrophy, mild chronic small vessel disease  SIGNIFICANT EVENTS: 6/17 RLE arteriography per Dr. Edilia Bo for PVD and non-healing R toe wound 6/18 Admit to [facility] with complaints of SOB x 2 weeks 6/19 Decompensated early am, required intubation / ICU transfer 6/20-trop elevation, hep , asa 6/21: polymyoclonus with when precedex stopped 6/22: continued plt count drop to 93 down from 131,agitation required change in sedation to prop 6/23: worsening chest film, plt stable 7/2>> staph bacteremia  7/04//2016. Modified barium swallow    DVT Prophylaxis  :  Heparin    Lab Results  Component Value Date   PLT 161 01/25/2015    Inpatient Medications  Scheduled Meds: . antiseptic oral rinse  7 mL Mouth Rinse QID  . aspirin  150 mg Rectal Daily  . chlorhexidine  15 mL Mouth Rinse BID  . clopidogrel  75 mg Per Tube Daily  . feeding supplement (PRO-STAT SUGAR FREE 64)  60 mL Per Tube BID  . feeding supplement (VITAL HIGH PROTEIN)  1,000 mL Per Tube Q24H  . heparin subcutaneous  5,000 Units Subcutaneous 3 times per day  . insulin aspart  0-20 Units Subcutaneous 6 times per day  . metoprolol tartrate  12.5 mg Oral BID  . pantoprazole sodium  40 mg Per Tube Daily  . vancomycin  1,500 mg Intravenous Q24H   Continuous Infusions: . dextrose 110 mL/hr at 01/26/15 0530   PRN Meds:.albuterol, fentaNYL, hydrALAZINE, metoprolol, RESOURCE THICKENUP CLEAR, sodium chloride  Antibiotics  :     Anti-infectives    Start     Dose/Rate Route Frequency Ordered Stop   01/25/15 0600  vancomycin (VANCOCIN) 1,500 mg in sodium chloride 0.9 % 500 mL IVPB     1,500 mg 250 mL/hr over 120 Minutes Intravenous Every 24 hours 01/24/15 1132     01/22/15 0600  vancomycin (VANCOCIN)  1,250 mg in sodium chloride 0.9 % 250 mL IVPB  Status:  Discontinued     1,250 mg 166.7 mL/hr over 90 Minutes Intravenous Every 48 hours 01/20/15 0558 01/20/15 1131   01/22/15 0600  vancomycin (VANCOCIN) 1,500 mg in sodium chloride 0.9 % 500 mL IVPB  Status:  Discontinued     1,500 mg 250 mL/hr over 120 Minutes Intravenous Every 48 hours 01/20/15 1131 01/24/15 1132   01/20/15 1300  piperacillin-tazobactam (ZOSYN) IVPB 3.375 g  Status:  Discontinued     3.375 g 12.5 mL/hr over 240 Minutes Intravenous Every 8 hours 01/20/15 1259 01/22/15 1542   01/20/15 0630  vancomycin (VANCOCIN) 1,500 mg in sodium chloride 0.9 % 500 mL IVPB     1,500 mg 250 mL/hr over 120 Minutes Intravenous  Once 01/20/15 0558 01/20/15 0828   01/13/15 1045  cefTRIAXone (ROCEPHIN) 1 g in dextrose 5 % 50  mL IVPB - Premix     1 g 100 mL/hr over 30 Minutes Intravenous Every 24 hours 01/13/15 1038 01/16/15 1126   01/11/15 1000  vancomycin (VANCOCIN) 1,250 mg in sodium chloride 0.9 % 250 mL IVPB  Status:  Discontinued     1,250 mg 166.7 mL/hr over 90 Minutes Intravenous Every 24 hours 01/10/15 0940 01/13/15 1038   01/10/15 1600  piperacillin-tazobactam (ZOSYN) IVPB 3.375 g  Status:  Discontinued     3.375 g 12.5 mL/hr over 240 Minutes Intravenous Every 8 hours 01/10/15 0940 01/13/15 1038   01/10/15 1000  vancomycin (VANCOCIN) 2,000 mg in sodium chloride 0.9 % 500 mL IVPB     2,000 mg 250 mL/hr over 120 Minutes Intravenous  Once 01/10/15 0927 01/10/15 1218   01/10/15 0930  piperacillin-tazobactam (ZOSYN) IVPB 3.375 g     3.375 g 100 mL/hr over 30 Minutes Intravenous  Once 01/10/15 0927 01/10/15 1048        Objective:   Filed Vitals:   01/25/15 1641 01/25/15 1900 01/26/15 0416 01/26/15 0743  BP: 146/36 147/55 158/54 132/51  Pulse: 77 65 63 64  Temp: 98.4 F (36.9 C) 97.9 F (36.6 C) 97.9 F (36.6 C) 97.5 F (36.4 C)  TempSrc: Oral Oral Oral Oral  Resp: 21 20 21 19   Height:      Weight:   102.9 kg (226 lb 13.7 oz)    SpO2: 97%  93% 100%    Wt Readings from Last 3 Encounters:  01/26/15 102.9 kg (226 lb 13.7 oz)  01/08/15 108.863 kg (240 lb)  12/23/14 112.447 kg (247 lb 14.4 oz)     Intake/Output Summary (Last 24 hours) at 01/26/15 1058 Last data filed at 01/26/15 0643  Gross per 24 hour  Intake 3568.33 ml  Output   1640 ml  Net 1928.33 ml     Physical Exam  Awake Alert, Oriented X 3, appears weak,No new F.N deficits, Normal affect [place].AT,PERRAL Supple Neck,No JVD, No cervical lymphadenopathy appriciated.  Symmetrical Chest wall movement, Good air movement bilaterally, Coarse B. sounds RRR,No Gallops,Rubs or new Murmurs, No Parasternal Heave +ve B.Sounds, Abd Soft, No tenderness, No organomegaly appriciated, No rebound - guarding or rigidity. No Cyanosis, Clubbing or edema, No new Rash or bruise       Data Review:   Micro Results Recent Results (from the past 240 hour(s))  Culture, blood (routine x 2)     Status: None   Collection Time: 01/19/15  9:50 AM  Result Value Ref Range Status   Specimen Description BLOOD LEFT HAND  Final   Special Requests BOTTLES DRAWN AEROBIC AND ANAEROBIC  10 CC EACH  Final   Culture  Setup Time   Final    GRAM POSITIVE COCCI IN CLUSTERS IN PAIRS AEROBIC BOTTLE ONLY CRITICAL RESULT CALLED TO, READ BACK BY AND VERIFIED WITH: G HARDUK,RN 6/29 0539 RHOLMES CONFIRMED BY R GREEN    Culture STAPHYLOCOCCUS SPECIES (COAGULASE NEGATIVE)  Final   Report Status 01/24/2015 FINAL  Final   Organism ID, Bacteria STAPHYLOCOCCUS SPECIES (COAGULASE NEGATIVE)  Final      Susceptibility   Staphylococcus species (coagulase negative) - MIC*    CIPROFLOXACIN 4 RESISTANT Resistant     GENTAMICIN <=0.5 SENSITIVE Sensitive     OXACILLIN >=4 RESISTANT Resistant     VANCOMYCIN 2 SENSITIVE Sensitive     TRIMETH/SULFA 160 RESISTANT Resistant     CLINDAMYCIN <=0.25 SENSITIVE Sensitive     RIFAMPIN <=0.5 SENSITIVE Sensitive     Inducible  Clindamycin NEGATIVE Sensitive     *  STAPHYLOCOCCUS SPECIES (COAGULASE NEGATIVE)  Culture, respiratory (NON-Expectorated)     Status: None   Collection Time: 01/19/15 10:15 AM  Result Value Ref Range Status   Specimen Description TRACHEAL ASPIRATE  Final   Special Requests NONE  Final   Gram Stain   Final    MODERATE WBC PRESENT,BOTH PMN AND MONONUCLEAR NO SQUAMOUS EPITHELIAL CELLS SEEN NO ORGANISMS SEEN Performed at [facility]    Culture   Final    Non-Pathogenic Oropharyngeal-type Flora Isolated. Performed at [facility]    Report Status 01/21/2015 FINAL  Final  Urine culture     Status: None   Collection Time: 01/19/15 10:21 AM  Result Value Ref Range Status   Specimen Description URINE, CATHETERIZED  Final   Special Requests NONE  Final   Culture NO GROWTH 1 DAY  Final   Report Status 01/20/2015 FINAL  Final  Culture, blood (routine x 2)     Status: None   Collection Time: 01/19/15 10:24 AM  Result Value Ref Range Status   Specimen Description BLOOD RIGHT HAND  Final   Special Requests BOTTLES DRAWN AEROBIC AND ANAEROBIC 10 CC EACH  Final   Culture  Setup Time   Final    GRAM POSITIVE COCCI IN CLUSTERS IN PAIRS AEROBIC BOTTLE ONLY CRITICAL RESULT CALLED TO, READ BACK BY AND VERIFIED WITH: G HARDUK,RN 01/20/15 0539 RHOLMES CONFIRMED BY R GREEN    Culture   Final    STAPHYLOCOCCUS SPECIES (COAGULASE NEGATIVE) SUSCEPTIBILITIES PERFORMED ON PREVIOUS CULTURE WITHIN THE LAST 5 DAYS.    Report Status 01/24/2015 FINAL  Final  Clostridium Difficile by PCR (not at [facility])     Status: None   Collection Time: 01/21/15 12:10 PM  Result Value Ref Range Status   C difficile by pcr NEGATIVE NEGATIVE Final  Culture, blood (routine x 2)     Status: None (Preliminary result)   Collection Time: 01/24/15  3:00 PM  Result Value Ref Range Status   Specimen Description BLOOD LEFT ARM  Final   Special Requests BOTTLES DRAWN AEROBIC AND ANAEROBIC 10CC  Final   Culture NO GROWTH < 24 HOURS  Final   Report  Status PENDING  Incomplete    Radiology Reports   Ct Head Wo Contrast  01/10/2015   CLINICAL DATA:  Altered mental status.  EXAM: CT HEAD WITHOUT CONTRAST  TECHNIQUE: Contiguous axial images were obtained from the base of the skull through the vertex without intravenous contrast.  COMPARISON:  07/13/2014.  FINDINGS: Diffusely enlarged ventricles and subarachnoid spaces. Patchy white matter low density in both cerebral hemispheres. Stable small, more focal area of low density in the left frontoparietal white matter. No intracranial hemorrhage, mass lesion or CT evidence of acute infarction. Unremarkable bones and included paranasal sinuses.  IMPRESSION: 1. No acute abnormality. 2. Stable moderate diffuse cerebral atrophy and mild chronic small vessel white matter ischemic changes in both cerebral hemispheres. 3. 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   Electronically Signed   By: Beckie Salts M.D.   On: 01/10/2015 12:34   Mr Brain Wo Contrast  01/14/2015   CLINICAL DATA:  Initial evaluation for acute encephalopathy.  EXAM: MRI HEAD WITHOUT CONTRAST  TECHNIQUE: Multiplanar, multiecho pulse sequences of the brain and surrounding structures were obtained without intravenous contrast.  COMPARISON:  Prior CT from 01/10/2015  FINDINGS: Diffuse prominence of the CSF containing spaces is compatible with generalized cerebral atrophy. There are  scattered patchy T2/FLAIR hyperintense foci within the periventricular white matter, nonspecific, but likely related to chronic small vessel ischemic disease. These are similar relative to prior MRI.  No abnormal foci of restricted diffusion to suggest acute intracranial infarct. Gray-white matter differentiation maintained. Normal intravascular flow voids are preserved. No acute or chronic intracranial hemorrhage.  No mass lesion, mass effect, or midline shift. No hydrocephalus. No extra-axial fluid collection. Hippocampi symmetric in size with normal  morphology and signal intensity.  Craniocervical junction within normal limits. Pituitary gland normal.  No acute abnormality about the orbits. Sequela prior bilateral lens extraction noted.  Fluid density present within the nasopharynx, likely related intubation. Scattered mucosal thickening present within the sphenoid sinuses and ethmoidal air cells. Bilateral mastoid effusions present. Inner ear structures normal.  Scattered degenerative changes present within the visualized upper cervical spine. Bone marrow signal intensity within normal limits. Scalp soft tissues unremarkable.  IMPRESSION: 1. No acute intracranial infarct or other process identified. 2. Generalized age-related cerebral atrophy with mild chronic small vessel ischemic disease.   Electronically Signed   By: Rise Mu M.D.   On: 01/14/2015 04:30   Ct Abdomen Pelvis W Contrast  01/09/2015   CLINICAL DATA:  Intermittent ongoing shortness of breath for 2 weeks, worse today. Abdominal pain for a few days with nausea. Recent discharge from hospital early this morning after stent placed in the lower extremity.  EXAM: CT ABDOMEN AND PELVIS WITH CONTRAST  TECHNIQUE: Multidetector CT imaging of the abdomen and pelvis was performed using the standard protocol following bolus administration of intravenous contrast.  CONTRAST:  80mL OMNIPAQUE IOHEXOL 300 MG/ML  SOLN  COMPARISON:  None.  FINDINGS: Atelectasis in the lung bases. Coronary artery and aortic valve calcifications.  Mild diffuse fatty infiltration of the liver. No focal liver lesions. The gallbladder, spleen, pancreas, adrenal glands, kidneys, inferior vena cava, and retroperitoneal lymph nodes are unremarkable. Calcification of the aorta without aneurysm. Calcification of the renal artery origins. Stomach, small bowel, and colon are not abnormally distended. No discrete wall thickening is noted. No free air or free fluid in the abdomen. Abdominal wall musculature appears intact.   Pelvis: The appendix is normal. Prostate gland is enlarged. There is increased density in the bladder which could be early contrast or may indicate hemorrhage. Correlation with urinalysis is recommended. No bladder wall thickening. Mildly enlarged lymph node in the left external iliac chain measuring 16 mm diameter and in the right iliac chain measuring 13 mm diameter, probably reactive. Skin defect and infiltration in the left groin is probably postoperative. Small right inguinal hernia containing fat. Degenerative changes in the spine. No destructive bone lesions. Postoperative laminectomies at L3-4, L4-5, and L5-S1.  IMPRESSION: Postoperative changes in the left groin region with infiltration in the subcutaneous fat, likely postoperative. Prominent lymph nodes in the iliac chains bilaterally are most likely to be reactive. Increased density in the bladder suggest possible hemorrhage. Correlation with urinalysis. Right inguinal hernia containing fat.   Electronically Signed   By: Burman Nieves M.D.   On: 01/09/2015 04:16   Dg Chest Port 1 View  01/24/2015   CLINICAL DATA:  Respiratory failure. Coronary disease. Hypertensive heart disease.  EXAM: PORTABLE CHEST - 1 VIEW  COMPARISON:  1 a prior  FINDINGS: Extubation. Removal of nasogastric tube. Midline trachea. Borderline cardiomegaly. No pleural effusion or pneumothorax. Clearing of right base airspace disease. Mild left base atelectasis remains.  IMPRESSION: Extubation and removal of nasogastric tube.  Improved aeration with mild left base  atelectasis remaining.   Electronically Signed   By: Jeronimo Greaves M.D.   On: 01/24/2015 09:15   Dg Chest Port 1 View  01/23/2015   CLINICAL DATA:  Acute respiratory failure  EXAM: PORTABLE CHEST - 1 VIEW  COMPARISON:  01/21/2015  FINDINGS: Endotracheal tube is 5-6 cm above the carina, slightly retracted since prior study. NG tube enters the stomach. Bibasilar airspace opacities are noted, left greater than right, likely  atelectasis. No significant change since prior study. Heart is upper limits normal in size. No significant visible effusions.  IMPRESSION: Continued bibasilar opacities, likely atelectasis, left greater than right.   Electronically Signed   By: Charlett Nose M.D.   On: 01/23/2015 08:35        CBC  Recent Labs Lab 01/20/15 0512 01/21/15 0231 01/23/15 0219 01/24/15 0528 01/25/15 0436  WBC 10.9* 11.5* 12.0* 9.7 8.7  HGB 16.3 15.5 14.9 14.5 14.4  HCT 52.8* 51.1 49.5 47.5 47.5  PLT 227 211 191 174 161  MCV 95.5 96.4 97.2 96.5 95.0  MCH 29.5 29.2 29.3 29.5 28.8  MCHC 30.9 30.3 30.1 30.5 30.3  RDW 16.5* 16.8* 16.5* 15.9* 15.6*    Chemistries   Recent Labs Lab 01/19/15 1830 01/20/15 0512 01/21/15 0231  01/23/15 0219 01/23/15 1732 01/24/15 0528 01/25/15 0436 01/26/15 0640  NA 153* 154* 155*  < > 154* 153* 155* 152* 146*  K 3.5 3.4* 3.2*  < > 2.9* 3.3* 3.2* 3.7 3.6  CL 103 101 105  < > 110 113* 111 116* 111  CO2 40* 38* 36*  < > 34* 32 34* 29 28  GLUCOSE 182* 177* 204*  < > 165* 163* 146* 138* 127*  BUN 113* 114* 127*  < > 98* 79* 59* 40* 25*  CREATININE 2.09* 2.22* 2.71*  < > 1.80* 1.46* 1.34* 1.19 1.06  CALCIUM 9.4 9.7 9.4  < > 9.1 9.2 9.7 9.2 9.0  MG  --  3.4* 3.4*  --  3.2*  --   --   --   --   AST 76*  --   --   --   --   --   --   --   --   ALT 34  --   --   --   --   --   --   --   --   ALKPHOS 51  --   --   --   --   --   --   --   --   BILITOT 0.7  --   --   --   --   --   --   --   --   < > = values in this interval not displayed. ------------------------------------------------------------------------------------------------------------------ estimated creatinine clearance is 66.8 mL/min (by C-G formula based on Cr of 1.06). ------------------------------------------------------------------------------------------------------------------ No results for input(s): HGBA1C in the last 72  hours. ------------------------------------------------------------------------------------------------------------------ No results for input(s): CHOL, HDL, LDLCALC, TRIG, CHOLHDL, LDLDIRECT in the last 72 hours. ------------------------------------------------------------------------------------------------------------------ No results for input(s): TSH, T4TOTAL, T3FREE, THYROIDAB in the last 72 hours.  Invalid input(s): FREET3 ------------------------------------------------------------------------------------------------------------------ No results for input(s): VITAMINB12, FOLATE, FERRITIN, TIBC, IRON, RETICCTPCT in the last 72 hours.  Coagulation profile No results for input(s): INR, PROTIME in the last 168 hours.  No results for input(s): DDIMER in the last 72 hours.  Cardiac Enzymes  Recent Labs Lab 01/19/15 1830 01/19/15 2041 01/20/15 0512 01/20/15 1435  CKMB 2.6  --  1.5  --   TROPONINI  0.41* 0.49* 0.54* 0.42*   ------------------------------------------------------------------------------------------------------------------ Invalid input(s): POCBNP   Time Spent in minutes   35   Shykeem Resurreccion K M.D on 01/26/2015 at 10:58 AM  Between 7am to 7pm - Pager - [phone]  After 7pm go to [email or web address] - password [email or web address]  [facility]   Office  [phone]

## 2015-01-26 NOTE — Clinical Social Work Note (Signed)
CSW received consult for SNF placment. Assessment completed and facility search initiated - full assessment to follow.   Genelle Bal, MSW, LCSW Licensed Clinical Social Worker Clinical Social Work Department [employer] [phone]

## 2015-01-26 NOTE — Progress Notes (Signed)
Nutrition Follow-up  DOCUMENTATION CODES:  Obesity unspecified  INTERVENTION:  -30 ml Prostat BID -Magic Cup with meals  NUTRITION DIAGNOSIS:  Inadequate oral intake related to dysphagia as evidenced by meal completion < 25%.  Ongoing  GOAL:  Patient will meet greater than or equal to 90% of their needs  Unmet  MONITOR:  PO intake, Supplement acceptance, Labs, Skin, I & O's  REASON FOR ASSESSMENT:  Consult Enteral/tube feeding initiation and management  ASSESSMENT: Patient admitted on 6/18 with a 2 week hx of SOB. Failed BiPAP therapy and required intubation on 6/19.  Pt extubated 01/23/15 and transferred to general medical floor.   S/p MBSS on 01/25/15. He has been upgraded to a dysphagia 2 diet with nectar thick liquids.   Visited pt while he was consuming lunch. He reports his appetite has not returned yet. He reports he is swallowing foods and liquids as well. Minimal amount of meal tray consumed- pt consumed half of thickened milk and all of tomato juice. His green beans and chicken and dumpling remained untouched; he reports he is more hungry for liquids at this time. Discussed importance of good meal intake to promote healing.   Labs reviewed: Na: 146.   Height:  Ht Readings from Last 1 Encounters:  01/10/15 5\' 9"  (1.753 m)    Weight:  Wt Readings from Last 1 Encounters:  01/26/15 226 lb 13.7 oz (102.9 kg)    Ideal Body Weight:  72.7 kg  Wt Readings from Last 10 Encounters:  01/26/15 226 lb 13.7 oz (102.9 kg)  01/08/15 240 lb (108.863 kg)  12/23/14 247 lb 14.4 oz (112.447 kg)  09/11/14 244 lb 12.8 oz (111.041 kg)  09/10/14 246 lb 12 oz (111.925 kg)  08/27/14 235 lb (106.595 kg)  08/18/14 247 lb (112.038 kg)  08/05/14 241 lb (109.317 kg)  07/31/14 237 lb (107.502 kg)  07/17/14 236 lb 9.6 oz (107.321 kg)    BMI:  Body mass index is 33.49 kg/(m^2).  Estimated Nutritional Needs:  Kcal:  2000-2200  Protein:  95-105 grams  Fluid:  2.0-2.2  L  Skin:  Wound (see comment) (open incision rt leg, st II rt loot, DTI sacrum)  Diet Order:  DIET DYS 2 Room service appropriate?: Yes; Fluid consistency:: Nectar Thick  EDUCATION NEEDS:  Education needs addressed   Intake/Output Summary (Last 24 hours) at 01/26/15 1504 Last data filed at 01/26/15 0643  Gross per 24 hour  Intake 3568.33 ml  Output   1640 ml  Net 1928.33 ml    Last BM:  01/23/15  Bao Coreas A. Mayford Knife, RD, LDN, CDE Pager: [phone] After hours Pager: [phone]

## 2015-01-26 NOTE — Progress Notes (Signed)
Peripherally Inserted Central Catheter/Midline Placement  The IV Nurse has discussed with the patient and/or persons authorized to consent for the patient, the purpose of this procedure and the potential benefits and risks involved with this procedure.  The benefits include less needle sticks, lab draws from the catheter and patient may be discharged home with the catheter.  Risks include, but not limited to, infection, bleeding, blood clot (thrombus formation), and puncture of an artery; nerve damage and irregular heat beat.  Alternatives to this procedure were also discussed.  PICC/Midline Placement Documentation  PICC / Midline Single Lumen 01/26/15 PICC Right Cephalic 47 cm 2 cm (Active)  Indication for Insertion or Continuance of Line Prolonged intravenous therapies 01/26/2015  4:00 PM  Exposed Catheter (cm) 2 cm 01/26/2015  4:00 PM  Dressing Change Due 02/02/15 01/26/2015  4:00 PM    Telephone consent   Stacie Glaze Horton 01/26/2015, 4:22 PM

## 2015-01-26 NOTE — Care Management (Signed)
Important Message  Patient Details  Name: [name] MRN: [id number] Date of Birth: 01-15-1933   Medicare Important Message Given:  Yes-third notification given    [name] 01/26/2015, 11:07 AM

## 2015-01-26 NOTE — Progress Notes (Signed)
Inpatient Rehabilitation  I continue to follow pt. for appropriateness for IP Rehab.  He had slightly improved tolerance/participation in PT session today however not yet enough to tolerate 3 hours daily of therapies in CIR program.    I have discussed with Genelle Bal, SW that it is likely for pt. to need SNF level care post acute. I will continue to follow along.  Please call if questions.  Weldon Picking PT Inpatient Rehab Admissions Coordinator Cell [phone] Office [phone]

## 2015-01-27 ENCOUNTER — Ambulatory Visit: Payer: Medicare Other | Admitting: Internal Medicine

## 2015-01-27 LAB — BASIC METABOLIC PANEL
ANION GAP: 8 (ref 5–15)
BUN: 16 mg/dL (ref 6–20)
CHLORIDE: 109 mmol/L (ref 101–111)
CO2: 24 mmol/L (ref 22–32)
CREATININE: 0.86 mg/dL (ref 0.61–1.24)
Calcium: 9 mg/dL (ref 8.9–10.3)
GFR calc Af Amer: >60 mL/min (ref >60)
GFR calc non Af Amer: >60 mL/min (ref >60)
Glucose, Bld: 154 mg/dL — ABNORMAL HIGH (ref 65–99)
POTASSIUM: 4 mmol/L (ref 3.5–5.1)
Sodium: 141 mmol/L (ref 135–145)

## 2015-01-27 LAB — GLUCOSE, CAPILLARY
Glucose-Capillary: 118 mg/dL — ABNORMAL HIGH (ref 65–99)
Glucose-Capillary: 120 mg/dL — ABNORMAL HIGH (ref 65–99)
Glucose-Capillary: 141 mg/dL — ABNORMAL HIGH (ref 65–99)
Glucose-Capillary: 145 mg/dL — ABNORMAL HIGH (ref 65–99)
Glucose-Capillary: 147 mg/dL — ABNORMAL HIGH (ref 65–99)
Glucose-Capillary: 154 mg/dL — ABNORMAL HIGH (ref 65–99)

## 2015-01-27 MED ORDER — METOPROLOL TARTRATE 25 MG PO TABS
25.0000 mg | ORAL_TABLET | Freq: Two times a day (BID) | ORAL | Status: DC
  Administered 2015-01-27 – 2015-01-28 (×2): 25 mg via ORAL
  Filled 2015-01-27 (×4): qty 1

## 2015-01-27 MED ORDER — FUROSEMIDE 40 MG PO TABS
40.0000 mg | ORAL_TABLET | Freq: Every day | ORAL | Status: DC
  Administered 2015-01-27 – 2015-01-28 (×2): 40 mg via ORAL
  Filled 2015-01-27 (×3): qty 1

## 2015-01-27 MED ORDER — CHLORHEXIDINE GLUCONATE 0.12 % MT SOLN
15.0000 mL | Freq: Two times a day (BID) | OROMUCOSAL | Status: DC
  Administered 2015-01-27 – 2015-01-28 (×3): 15 mL via OROMUCOSAL
  Filled 2015-01-27 (×6): qty 15

## 2015-01-27 MED ORDER — INSULIN ASPART 100 UNIT/ML ~~LOC~~ SOLN: 0.0000 [IU] | Freq: Every day | SUBCUTANEOUS | Status: DC

## 2015-01-27 MED ORDER — INSULIN ASPART 100 UNIT/ML ~~LOC~~ SOLN
0.0000 [IU] | Freq: Three times a day (TID) | SUBCUTANEOUS | Status: DC
  Administered 2015-01-27 – 2015-01-28 (×3): 1 [IU] via SUBCUTANEOUS

## 2015-01-27 MED ORDER — CETYLPYRIDINIUM CHLORIDE 0.05 % MT LIQD
7.0000 mL | Freq: Two times a day (BID) | OROMUCOSAL | Status: DC
  Administered 2015-01-27 – 2015-01-28 (×3): 7 mL via OROMUCOSAL

## 2015-01-27 NOTE — Progress Notes (Signed)
I spoke with pt's daughter Lise Auer [phone]) regarding pt.'s rehab needs and 24 hour care following a short IP Rehab stay.   Gavin Pound states that she and her husband  moved in with Mr. Pettis several months back,  however her husband is having "major back surgery" tomorrow am and they will be returning to their home.  She states she would like to be able to assist her Dad but cannot commit to this since she will be caring for her husband.  She requests [facility] for her dad's care,  as it is close to most of the immediate family and he has been there  before.  I have notified Genelle Bal of family's request.  I will sign off.  Please call if questions.  Weldon Picking PT Inpatient Rehab Admissions Coordinator Cell [phone] Office [phone]

## 2015-01-27 NOTE — Progress Notes (Signed)
ANTIBIOTIC CONSULT NOTE - FOLLOW UP  Pharmacy Consult for Vancomycin Indication: MRSE bacteremia  Allergies  Allergen Reactions  . Statins Other (See Comments)    Severe leg myalgias, weakness    Patient Measurements: Height: 5\' 9"  (175.3 cm) Weight: 242 lb 8.1 oz (110 kg) IBW/kg (Calculated) : 70.7 Adjusted Body Weight:    Vital Signs: Temp: 98.6 F (37 C) (07/06 0913) Temp Source: Oral (07/06 0913) BP: 142/58 mmHg (07/06 0913) Pulse Rate: 89 (07/06 0913) Intake/Output from previous day: 07/05 0701 - 07/06 0700 In: 2522.7 [I.V.:2522.7] Out: 1000 [Urine:1000] Intake/Output from this shift: Total I/O In: 50 [P.O.:50] Out: 500 [Urine:500]  Labs:  Recent Labs  01/25/15 0436 01/26/15 0640 01/27/15 0537  WBC 8.7  --   --   HGB 14.4  --   --   PLT 161  --   --   CREATININE 1.19 1.06 0.86   Estimated Creatinine Clearance: 85.1 mL/min (by C-G formula based on Cr of 0.86). No results for input(s): VANCOTROUGH, VANCOPEAK, VANCORANDOM, GENTTROUGH, GENTPEAK, GENTRANDOM, TOBRATROUGH, TOBRAPEAK, TOBRARND, AMIKACINPEAK, AMIKACINTROU, AMIKACIN in the last 72 hours.    Assessment: Admit Complaint: 81yo male presents with AMS and acute respiratory failure.   ID: MRSE bacteremia, likely central line which was removed.  Day # 8/28 of vancomycin. Afebrile, WBC 8.6, creat down to 0.86.    zosyn 6/19>>6/22; 6/29>>7/1 Vanc 6/19>> 6/22; 6/29>> CTX  6/22 >> (6/26)  6/30: Cdiff negative. 6/28 BCx x2>> 2/2 MRSE MIC vanc = 2 6/28 UCx >> NEG 6/28 Trach aspirate>> normal flora 6/19 UCx: 20K pseudomonas (colonizer?/ pan sensitive) 6/19 MRSA pcr (-)  Goal of Therapy:  Vancomycin trough level 15-20 mcg/ml  Plan:  continue Vancomycin to 1500mg  IV q24h Check steady-state vancomycin trough tomorrow morning to assess current regimen  Herby Abraham, Pharm.D. [phone] 01/27/2015 2:41 PM

## 2015-01-27 NOTE — Clinical Social Work Placement (Signed)
   CLINICAL SOCIAL WORK PLACEMENT  NOTE  Date:  01/27/2015  Patient Details  Name: [name] MRN: [id number] Date of Birth: 04-03-1933  Clinical Social Work is seeking post-discharge placement for this patient at the Skilled  Nursing Facility level of care (*CSW will initial, date and re-position this form in  chart as items are completed):  Yes   Patient/family provided with [place] Clinical Social Work Department's list of facilities offering this level of care within the geographic area requested by the patient (or if unable, by the patient's family).  Yes   Patient/family informed of their freedom to choose among providers that offer the needed level of care, that participate in Medicare, Medicaid or managed care program needed by the patient, have an available bed and are willing to accept the patient.  Yes   Patient/family informed of [facility]'s ownership interest in [facility] and [facility], as well as of the fact that they are under no obligation to receive care at these facilities.  PASRR submitted to EDS on       PASRR number received on       Existing PASRR number confirmed on 01/26/15     FL2 transmitted to all facilities in geographic area requested by pt/family on 01/26/15     FL2 transmitted to all facilities within larger geographic area on 01/27/15 ([facility] in [place] per daughter [name] request. She reported that her father wants this facility if [facility] cannot take him.)     Patient informed that his/her managed care company has contracts with or will negotiate with certain facilities, including the following:        Yes (Bed offers given on 01/27/15. )   Patient/family informed of bed offers received.  Patient chooses bed at       Physician recommends and patient chooses bed at      Patient to be transferred to   on  .  Patient to be transferred to facility by       Patient family notified on   of  transfer.  Name of family member notified:        PHYSICIAN Please prepare priority discharge summary, including medications     Additional Comment:  01/26/25: Per telephone conversation with daughter [name], facility preferences are: (1) [facility] (2) [facility] and (3) [facility].   _______________________________________________ Cristobal Goldmann, LCSW 01/27/2015, 6:22 PM

## 2015-01-27 NOTE — Clinical Social Work Note (Signed)
Clinical Social Work Assessment  Patient Details  Name: [name] MRN: [id number] Date of Birth: [birth year]  Date of referral:  01/26/15               Reason for consult:  Facility Placement                Permission sought to share information with:  Facility Medical sales representative, Family Supports (Patient was awake during assessment but did not participate. Daughter Sheela Stack provided CSW with family contact information.) Permission granted to share information::  Yes, Verbal Permission Granted (Permission granted by daughter [name] to contact other siblings.)  Name::     1st contact is Ardell Isaacs [phone].  Agency::     Relationship::  Daughter  Contact Information:     Housing/Transportation Living arrangements for the past 2 months:  Single Family Home Source of Information:  Adult Children (CSW talked with daughter Ardell Isaacs on 7/5 at the bedside.) Patient Interpreter Needed:  None Criminal Activity/Legal Involvement Pertinent to Current Situation/Hospitalization:  No - Comment as needed Significant Relationships:    Lives with:  Adult Children Do you feel safe going back to the place where you live?  Yes Need for family participation in patient care:  Yes (Comment)  Care giving concerns:  Daughter, Stanton Kidney aware that patient needs ST rehab before going home.   Social Worker assessment / plan:  CSW talked with family/patient on 01/26/15 regarding discharge planning and recommendation of ST rehab. Daughter Sheela Stack [phone]) was at the bedside and is in agreement with ST rehab. She indicated that her dad's facility choice is [facility]. Daughter reported that her sister Ardell Isaacs [phone]) had been staying with their dad for the past 6 months evenings (after work) and weekends. Ms. Gale Journey reported that she has 4 other siblings: 3 sisters and a brother who lives in [place], [place]. CSW explained facility search process  and provided daughter with skilled facility list.   Employment status:  Retired Community education officer information:  Medicare, Managed Care PT Recommendations:  Skilled Nursing Facility Information / Referral to community resources:  Other (Comment Required) (None needed or requested at this time)  Patient/Family's Response to care:  Ms. Gale Journey did not have any concerns regarding patient's care.  Patient/Family's Understanding of and Emotional Response to Diagnosis, Current Treatment, and Prognosis:  Not discussed.  Emotional Assessment Appearance:  Appears stated age Attitude/Demeanor/Rapport:  Unable to Assess, Other (Patient did not participate in conversation during assessment with daughter.) Affect (typically observed):  Quiet Orientation:  Oriented to Self, Oriented to Place Alcohol / Substance use:  Never Used, Alcohol Use, Illicit Drugs (Patient reports that he has never smoked and does not drink or use illicit drugs.) Psych involvement (Current and /or in the community):  No (Comment)  Discharge Needs  Concerns to be addressed:  Discharge Planning Concerns Readmission within the last 30 days:  Yes Current discharge risk:  None Barriers to Discharge:  No Barriers Identified   Cristobal Goldmann, LCSW 01/27/2015, 6:12 PM

## 2015-01-27 NOTE — Progress Notes (Signed)
Patient Demographics:    [name], is a 81 y.o. male, DOB - September 19, 1932, WUJ:[id number]  Admit date - 01/09/2015   Admitting Physician Hillary Bow, DO  Outpatient Primary MD for the patient is [name], MD  LOS - 18   Assumed care of the patient on 01/24/2015 on day 15 of hospital stay. Pink transferred from critical care service.   Summary  [name] is a 81 y.o. male with pmh of CAD, DM, CKD, AS, he also has a right foot ulcer for the last few months for which he is getting outpatient wound care, Patient presents to the ED with c/o intermittent, ongoing, and unchanged SOB x2 weeks. Worsened today. SOB is worsened with mild exertion. He has aortic stenosis for a long time and has had exertional dyspnea for several months, he follows with Dr. Donnie Aho who is his cardiologist. He was admitted for acute respiratory failure secondary to possible critical left ear induced left-sided heart failure with pulmonary edema, possible HCAP, he also developed non-ST elevation MI , he was seen by cardiology, he was tried on BiPAP initially however he continued to get worse and was intubated and transferred to pulmonary critical care.   In the hospital he developed's coag-negative staph bacteremia, source is unclear, TTE did not show any vegetation and he remains too frail for a TEE. He was transferred under hospitalist care on day 15 of his hospital stay I'll resume his care on 01/24/2015.    Chief Complaint  Patient presents with  . Shortness of Breath        Subjective:    [name] today has, No headache, No chest pain, No abdominal pain - No Nausea, No new weakness tingling or numbness, No Cough - SOB.  Feels better.   Assessment  & Plan :    1. Acute on chronic hypoxic  respiratory failure in the setting of moderate to severe aortic stenosis. Non-ST elevation MI. Pulmonary edema + ? HCAP - was intubated and was under the care of ICU physicians till 01/23/2015, was extubated 01/23/2015. Currently on room air with nonacute chest x-ray, has finished anti-biotics for HCAP, on aspirin, beta blocker along with Plavix for his non-ST elevation MI. Cardiology following.   Cardiology to decide whether patient is a candidate for left heart cath or not in the light of his ongoing bacteremia, most likely post DC outpatient. Likely SNF DC in am.   2. Coag negative bacteremia. Source unclear. Does have a chronic wound on the right foot. Also aortic stenosis, currently on vancomycin. ID input appreciated. Poor candidate for TEE. TTE does not show any frank vegetation. Per ID treat with IV vancomycin for a total of 1 month, start date 01-19-15. PICC placed in R arm.   3. Dysphagia post extubation. D2 Nectar thick diet, had barium swallow on 01/25/2015.Speech following.   4. Hyponatremia with dehydration and acute renal failure. Hydrated with D5W monitor BMP. Resolved.   5. Hypokalemia. Replaced and stable.   6. Non-ST elevation MI. Cardiology following, currently chest pain-free, on aspirin-Plavix-beta blocker as tolerated orally. Further management per cardiology.   7. History of gout. Resumed allopurinol.   8. GERD. On PPI   9. Essential hypertension. Low dose B blocker + As  needed IV hydralazine.        Code Status : No Intubation  Family Communication  : daughter in detail 01-23-15, family bedside  Disposition Plan  : SNF in am  Consults  :  PCCM, Cards, ID  Procedures  :    6/18 CT ABD >> post-op changes in L groin, increased density in bladder, bibasilar atx 6/18 ECHO >> LVEF 55-60%, grade II diastolic dysfunction, moderate AS (wosening since last ECHO) 6/20 EEG>>>metabolic vs drug affect, no focus  6/19 CT of Head >> Stable moderate diffuse  cerebral atrophy and mild chronic small vessel white matter ischemic changes 6/20 Echo: Left ventricle: The cavity size was normal. Wall thickness was normal. Systolic function was normal. The estimated ejection fraction was in the range of 60% to 65%. Wall motion was normal;there were no regional wall motion abnormalities. 6/22: MR Brain: No infarct, age related atrophy, mild chronic small vessel disease  SIGNIFICANT EVENTS: 6/17 RLE arteriography per Dr. Edilia Bo for PVD and non-healing R toe wound 6/18 Admit to [facility] with complaints of SOB x 2 weeks 6/19 Decompensated early am, required intubation / ICU transfer 6/20-trop elevation, hep , asa 6/21: polymyoclonus with when precedex stopped 6/22: continued plt count drop to 93 down from 131,agitation required change in sedation to prop 6/23: worsening chest film, plt stable 7/2>> staph bacteremia  7/04//2016. Modified barium swallow   01-26-15 - R arm PICC  DVT Prophylaxis  :  Heparin    Lab Results  Component Value Date   PLT 161 01/25/2015    Inpatient Medications  Scheduled Meds: . antiseptic oral rinse  7 mL Mouth Rinse q12n4p  . aspirin  81 mg Oral Daily  . chlorhexidine  15 mL Mouth Rinse BID  . clopidogrel  75 mg Per Tube Daily  . feeding supplement (PRO-STAT SUGAR FREE 64)  60 mL Per Tube BID  . feeding supplement (VITAL HIGH PROTEIN)  1,000 mL Per Tube Q24H  . heparin subcutaneous  5,000 Units Subcutaneous 3 times per day  . insulin aspart  0-5 Units Subcutaneous QHS  . insulin aspart  0-9 Units Subcutaneous TID WC  . metoprolol tartrate  12.5 mg Oral BID  . pantoprazole  40 mg Oral Daily  . vancomycin  1,500 mg Intravenous Q24H   Continuous Infusions:   PRN Meds:.albuterol, fentaNYL, hydrALAZINE, metoprolol, RESOURCE THICKENUP CLEAR, sodium chloride, sodium chloride, traMADol  Antibiotics  :     Anti-infectives    Start     Dose/Rate Route Frequency Ordered Stop   01/25/15 0600  vancomycin (VANCOCIN) 1,500 mg  in sodium chloride 0.9 % 500 mL IVPB     1,500 mg 250 mL/hr over 120 Minutes Intravenous Every 24 hours 01/24/15 1132     01/22/15 0600  vancomycin (VANCOCIN) 1,250 mg in sodium chloride 0.9 % 250 mL IVPB  Status:  Discontinued     1,250 mg 166.7 mL/hr over 90 Minutes Intravenous Every 48 hours 01/20/15 0558 01/20/15 1131   01/22/15 0600  vancomycin (VANCOCIN) 1,500 mg in sodium chloride 0.9 % 500 mL IVPB  Status:  Discontinued     1,500 mg 250 mL/hr over 120 Minutes Intravenous Every 48 hours 01/20/15 1131 01/24/15 1132   01/20/15 1300  piperacillin-tazobactam (ZOSYN) IVPB 3.375 g  Status:  Discontinued     3.375 g 12.5 mL/hr over 240 Minutes Intravenous Every 8 hours 01/20/15 1259 01/22/15 1542   01/20/15 0630  vancomycin (VANCOCIN) 1,500 mg in sodium chloride 0.9 % 500 mL IVPB  1,500 mg 250 mL/hr over 120 Minutes Intravenous  Once 01/20/15 0558 01/20/15 0828   01/13/15 1045  cefTRIAXone (ROCEPHIN) 1 g in dextrose 5 % 50 mL IVPB - Premix     1 g 100 mL/hr over 30 Minutes Intravenous Every 24 hours 01/13/15 1038 01/16/15 1126   01/11/15 1000  vancomycin (VANCOCIN) 1,250 mg in sodium chloride 0.9 % 250 mL IVPB  Status:  Discontinued     1,250 mg 166.7 mL/hr over 90 Minutes Intravenous Every 24 hours 01/10/15 0940 01/13/15 1038   01/10/15 1600  piperacillin-tazobactam (ZOSYN) IVPB 3.375 g  Status:  Discontinued     3.375 g 12.5 mL/hr over 240 Minutes Intravenous Every 8 hours 01/10/15 0940 01/13/15 1038   01/10/15 1000  vancomycin (VANCOCIN) 2,000 mg in sodium chloride 0.9 % 500 mL IVPB     2,000 mg 250 mL/hr over 120 Minutes Intravenous  Once 01/10/15 0927 01/10/15 1218   01/10/15 0930  piperacillin-tazobactam (ZOSYN) IVPB 3.375 g     3.375 g 100 mL/hr over 30 Minutes Intravenous  Once 01/10/15 0927 01/10/15 1048        Objective:   Filed Vitals:   01/26/15 2037 01/27/15 0008 01/27/15 0430 01/27/15 0913  BP: 189/59 168/66 162/69 142/58  Pulse: 85 87 94 89  Temp: 98.3 F  (36.8 C) 98.9 F (37.2 C) 98.4 F (36.9 C) 98.6 F (37 C)  TempSrc:    Oral  Resp: Height:      Weight: 110 kg (242 lb 8.1 oz)     SpO2: 99% 94% 94% 94%    Wt Readings from Last 3 Encounters:  01/26/15 110 kg (242 lb 8.1 oz)  01/08/15 108.863 kg (240 lb)  12/23/14 112.447 kg (247 lb 14.4 oz)     Intake/Output Summary (Last 24 hours) at 01/27/15 1322 Last data filed at 01/27/15 0914  Gross per 24 hour  Intake 2542.67 ml  Output   1000 ml  Net 1542.67 ml     Physical Exam  Awake Alert, Oriented X 3, appears weak,No new F.N deficits, Normal affect [place].AT,PERRAL Supple Neck,No JVD, No cervical lymphadenopathy appriciated.  Symmetrical Chest wall movement, Good air movement bilaterally, Coarse B. sounds RRR,No Gallops,Rubs or new Murmurs, No Parasternal Heave +ve B.Sounds, Abd Soft, No tenderness, No organomegaly appriciated, No rebound - guarding or rigidity. No Cyanosis, Clubbing or edema, No new Rash or bruise       Data Review:   Micro Results Recent Results (from the past 240 hour(s))  Culture, blood (routine x 2)     Status: None   Collection Time: 01/19/15  9:50 AM  Result Value Ref Range Status   Specimen Description BLOOD LEFT HAND  Final   Special Requests BOTTLES DRAWN AEROBIC AND ANAEROBIC  10 CC EACH  Final   Culture  Setup Time   Final    GRAM POSITIVE COCCI IN CLUSTERS IN PAIRS AEROBIC BOTTLE ONLY CRITICAL RESULT CALLED TO, READ BACK BY AND VERIFIED WITH: G HARDUK,RN 6/29 0539 RHOLMES CONFIRMED BY R GREEN    Culture STAPHYLOCOCCUS SPECIES (COAGULASE NEGATIVE)  Final   Report Status 01/24/2015 FINAL  Final   Organism ID, Bacteria STAPHYLOCOCCUS SPECIES (COAGULASE NEGATIVE)  Final      Susceptibility   Staphylococcus species (coagulase negative) - MIC*    CIPROFLOXACIN 4 RESISTANT Resistant     GENTAMICIN <=0.5 SENSITIVE Sensitive     OXACILLIN >=4 RESISTANT Resistant     VANCOMYCIN 2 SENSITIVE Sensitive  TRIMETH/SULFA 160  RESISTANT Resistant     CLINDAMYCIN <=0.25 SENSITIVE Sensitive     RIFAMPIN <=0.5 SENSITIVE Sensitive     Inducible Clindamycin NEGATIVE Sensitive     * STAPHYLOCOCCUS SPECIES (COAGULASE NEGATIVE)  Culture, respiratory (NON-Expectorated)     Status: None   Collection Time: 01/19/15 10:15 AM  Result Value Ref Range Status   Specimen Description TRACHEAL ASPIRATE  Final   Special Requests NONE  Final   Gram Stain   Final    MODERATE WBC PRESENT,BOTH PMN AND MONONUCLEAR NO SQUAMOUS EPITHELIAL CELLS SEEN NO ORGANISMS SEEN Performed at [facility]    Culture   Final    Non-Pathogenic Oropharyngeal-type Flora Isolated. Performed at [facility]    Report Status 01/21/2015 FINAL  Final  Urine culture     Status: None   Collection Time: 01/19/15 10:21 AM  Result Value Ref Range Status   Specimen Description URINE, CATHETERIZED  Final   Special Requests NONE  Final   Culture NO GROWTH 1 DAY  Final   Report Status 01/20/2015 FINAL  Final  Culture, blood (routine x 2)     Status: None   Collection Time: 01/19/15 10:24 AM  Result Value Ref Range Status   Specimen Description BLOOD RIGHT HAND  Final   Special Requests BOTTLES DRAWN AEROBIC AND ANAEROBIC 10 CC EACH  Final   Culture  Setup Time   Final    GRAM POSITIVE COCCI IN CLUSTERS IN PAIRS AEROBIC BOTTLE ONLY CRITICAL RESULT CALLED TO, READ BACK BY AND VERIFIED WITH: G HARDUK,RN 01/20/15 0539 RHOLMES CONFIRMED BY R GREEN    Culture   Final    STAPHYLOCOCCUS SPECIES (COAGULASE NEGATIVE) SUSCEPTIBILITIES PERFORMED ON PREVIOUS CULTURE WITHIN THE LAST 5 DAYS.    Report Status 01/24/2015 FINAL  Final  Clostridium Difficile by PCR (not at [facility])     Status: None   Collection Time: 01/21/15 12:10 PM  Result Value Ref Range Status   C difficile by pcr NEGATIVE NEGATIVE Final  Culture, blood (routine x 2)     Status: None (Preliminary result)   Collection Time: 01/24/15  3:00 PM  Result Value Ref Range Status    Specimen Description BLOOD LEFT ARM  Final   Special Requests BOTTLES DRAWN AEROBIC AND ANAEROBIC 10CC  Final   Culture NO GROWTH 2 DAYS  Final   Report Status PENDING  Incomplete    Radiology Reports   Ct Head Wo Contrast  01/10/2015   CLINICAL DATA:  Altered mental status.  EXAM: CT HEAD WITHOUT CONTRAST  TECHNIQUE: Contiguous axial images were obtained from the base of the skull through the vertex without intravenous contrast.  COMPARISON:  07/13/2014.  FINDINGS: Diffusely enlarged ventricles and subarachnoid spaces. Patchy white matter low density in both cerebral hemispheres. Stable small, more focal area of low density in the left frontoparietal white matter. No intracranial hemorrhage, mass lesion or CT evidence of acute infarction. Unremarkable bones and included paranasal sinuses.  IMPRESSION: 1. No acute abnormality. 2. Stable moderate diffuse cerebral atrophy and mild chronic small vessel white matter ischemic changes in both cerebral hemispheres. 3. 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   Electronically Signed   By: Beckie Salts M.D.   On: 01/10/2015 12:34   Mr Brain Wo Contrast  01/14/2015   CLINICAL DATA:  Initial evaluation for acute encephalopathy.  EXAM: MRI HEAD WITHOUT CONTRAST  TECHNIQUE: Multiplanar, multiecho pulse sequences of the brain and surrounding structures were obtained without intravenous contrast.  COMPARISON:  Prior CT from 01/10/2015  FINDINGS: Diffuse prominence of the CSF containing spaces is compatible with generalized cerebral atrophy. There are scattered patchy T2/FLAIR hyperintense foci within the periventricular white matter, nonspecific, but likely related to chronic small vessel ischemic disease. These are similar relative to prior MRI.  No abnormal foci of restricted diffusion to suggest acute intracranial infarct. Gray-white matter differentiation maintained. Normal intravascular flow voids are preserved. No acute or chronic  intracranial hemorrhage.  No mass lesion, mass effect, or midline shift. No hydrocephalus. No extra-axial fluid collection. Hippocampi symmetric in size with normal morphology and signal intensity.  Craniocervical junction within normal limits. Pituitary gland normal.  No acute abnormality about the orbits. Sequela prior bilateral lens extraction noted.  Fluid density present within the nasopharynx, likely related intubation. Scattered mucosal thickening present within the sphenoid sinuses and ethmoidal air cells. Bilateral mastoid effusions present. Inner ear structures normal.  Scattered degenerative changes present within the visualized upper cervical spine. Bone marrow signal intensity within normal limits. Scalp soft tissues unremarkable.  IMPRESSION: 1. No acute intracranial infarct or other process identified. 2. Generalized age-related cerebral atrophy with mild chronic small vessel ischemic disease.   Electronically Signed   By: Rise Mu M.D.   On: 01/14/2015 04:30   Ct Abdomen Pelvis W Contrast  01/09/2015   CLINICAL DATA:  Intermittent ongoing shortness of breath for 2 weeks, worse today. Abdominal pain for a few days with nausea. Recent discharge from hospital early this morning after stent placed in the lower extremity.  EXAM: CT ABDOMEN AND PELVIS WITH CONTRAST  TECHNIQUE: Multidetector CT imaging of the abdomen and pelvis was performed using the standard protocol following bolus administration of intravenous contrast.  CONTRAST:  80mL OMNIPAQUE IOHEXOL 300 MG/ML  SOLN  COMPARISON:  None.  FINDINGS: Atelectasis in the lung bases. Coronary artery and aortic valve calcifications.  Mild diffuse fatty infiltration of the liver. No focal liver lesions. The gallbladder, spleen, pancreas, adrenal glands, kidneys, inferior vena cava, and retroperitoneal lymph nodes are unremarkable. Calcification of the aorta without aneurysm. Calcification of the renal artery origins. Stomach, small bowel, and  colon are not abnormally distended. No discrete wall thickening is noted. No free air or free fluid in the abdomen. Abdominal wall musculature appears intact.  Pelvis: The appendix is normal. Prostate gland is enlarged. There is increased density in the bladder which could be early contrast or may indicate hemorrhage. Correlation with urinalysis is recommended. No bladder wall thickening. Mildly enlarged lymph node in the left external iliac chain measuring 16 mm diameter and in the right iliac chain measuring 13 mm diameter, probably reactive. Skin defect and infiltration in the left groin is probably postoperative. Small right inguinal hernia containing fat. Degenerative changes in the spine. No destructive bone lesions. Postoperative laminectomies at L3-4, L4-5, and L5-S1.  IMPRESSION: Postoperative changes in the left groin region with infiltration in the subcutaneous fat, likely postoperative. Prominent lymph nodes in the iliac chains bilaterally are most likely to be reactive. Increased density in the bladder suggest possible hemorrhage. Correlation with urinalysis. Right inguinal hernia containing fat.   Electronically Signed   By: Burman Nieves M.D.   On: 01/09/2015 04:16   Dg Chest Port 1 View  01/24/2015   CLINICAL DATA:  Respiratory failure. Coronary disease. Hypertensive heart disease.  EXAM: PORTABLE CHEST - 1 VIEW  COMPARISON:  1 a prior  FINDINGS: Extubation. Removal of nasogastric tube. Midline trachea. Borderline cardiomegaly. No pleural effusion or pneumothorax. Clearing of right  base airspace disease. Mild left base atelectasis remains.  IMPRESSION: Extubation and removal of nasogastric tube.  Improved aeration with mild left base atelectasis remaining.   Electronically Signed   By: Jeronimo Greaves M.D.   On: 01/24/2015 09:15   Dg Chest Port 1 View  01/23/2015   CLINICAL DATA:  Acute respiratory failure  EXAM: PORTABLE CHEST - 1 VIEW  COMPARISON:  01/21/2015  FINDINGS: Endotracheal tube is 5-6  cm above the carina, slightly retracted since prior study. NG tube enters the stomach. Bibasilar airspace opacities are noted, left greater than right, likely atelectasis. No significant change since prior study. Heart is upper limits normal in size. No significant visible effusions.  IMPRESSION: Continued bibasilar opacities, likely atelectasis, left greater than right.   Electronically Signed   By: Charlett Nose M.D.   On: 01/23/2015 08:35        CBC  Recent Labs Lab 01/21/15 0231 01/23/15 0219 01/24/15 0528 01/25/15 0436  WBC 11.5* 12.0* 9.7 8.7  HGB 15.5 14.9 14.5 14.4  HCT 51.1 49.5 47.5 47.5  PLT 211 191 174 161  MCV 96.4 97.2 96.5 95.0  MCH 29.2 29.3 29.5 28.8  MCHC 30.3 30.1 30.5 30.3  RDW 16.8* 16.5* 15.9* 15.6*    Chemistries   Recent Labs Lab 01/21/15 0231  01/23/15 0219 01/23/15 1732 01/24/15 0528 01/25/15 0436 01/26/15 0640 01/27/15 0537  NA 155*  < > 154* 153* 155* 152* 146* 141  K 3.2*  < > 2.9* 3.3* 3.2* 3.7 3.6 4.0  CL 105  < > 110 113* 111 116* 111 109  CO2 36*  < > 34* 32 34* 29 28 24   GLUCOSE 204*  < > 165* 163* 146* 138* 127* 154*  BUN 127*  < > 98* 79* 59* 40* 25* 16  CREATININE 2.71*  < > 1.80* 1.46* 1.34* 1.19 1.06 0.86  CALCIUM 9.4  < > 9.1 9.2 9.7 9.2 9.0 9.0  MG 3.4*  --  3.2*  --   --   --   --   --   < > = values in this interval not displayed. ------------------------------------------------------------------------------------------------------------------ estimated creatinine clearance is 85.1 mL/min (by C-G formula based on Cr of 0.86). ------------------------------------------------------------------------------------------------------------------ No results for input(s): HGBA1C in the last 72 hours. ------------------------------------------------------------------------------------------------------------------ No results for input(s): CHOL, HDL, LDLCALC, TRIG, CHOLHDL, LDLDIRECT in the last 72  hours. ------------------------------------------------------------------------------------------------------------------ No results for input(s): TSH, T4TOTAL, T3FREE, THYROIDAB in the last 72 hours.  Invalid input(s): FREET3 ------------------------------------------------------------------------------------------------------------------ No results for input(s): VITAMINB12, FOLATE, FERRITIN, TIBC, IRON, RETICCTPCT in the last 72 hours.  Coagulation profile No results for input(s): INR, PROTIME in the last 168 hours.  No results for input(s): DDIMER in the last 72 hours.  Cardiac Enzymes  Recent Labs Lab 01/20/15 1435  TROPONINI 0.42*   ------------------------------------------------------------------------------------------------------------------ Invalid input(s): POCBNP   Time Spent in minutes   35   SINGH,PRASHANT K M.D on 01/27/2015 at 1:22 PM  Between 7am to 7pm - Pager - [phone]  After 7pm go to [email or web address] - password [facility]  [facility]   Office  [phone]

## 2015-01-28 LAB — GLUCOSE, CAPILLARY
Glucose-Capillary: 108 mg/dL — ABNORMAL HIGH (ref 65–99)
Glucose-Capillary: 122 mg/dL — ABNORMAL HIGH (ref 65–99)
Glucose-Capillary: 122 mg/dL — ABNORMAL HIGH (ref 65–99)
Glucose-Capillary: 131 mg/dL — ABNORMAL HIGH (ref 65–99)

## 2015-01-28 LAB — VANCOMYCIN, TROUGH: VANCOMYCIN TR: 9 ug/mL — AB (ref 10.0–20.0)

## 2015-01-28 MED ORDER — VANCOMYCIN HCL 10 G IV SOLR: 1250.0000 mg | Freq: Two times a day (BID) | INTRAVENOUS | Status: DC

## 2015-01-28 MED ORDER — PRO-STAT SUGAR FREE PO LIQD: 60.0000 mL | Freq: Two times a day (BID) | ORAL | Status: DC

## 2015-01-28 MED ORDER — INSULIN ASPART 100 UNIT/ML ~~LOC~~ SOLN
SUBCUTANEOUS | Status: DC
Start: 2015-01-28 — End: 2015-02-24

## 2015-01-28 MED ORDER — VANCOMYCIN HCL 10 G IV SOLR
1250.0000 mg | Freq: Two times a day (BID) | INTRAVENOUS | Status: DC
  Administered 2015-01-28: 1250 mg via INTRAVENOUS
  Filled 2015-01-28 (×3): qty 1250

## 2015-01-28 NOTE — Progress Notes (Signed)
Physical Therapy Treatment Patient Details Name: [name] MRN: [id number] DOB: [birth year] Today's Date: 01/28/2015    History of Present Illness Patient is a 81 y.o. male with pmh of CAD,GERD, TIA, anxiety, depression, DM, CKD, AS. Admitted with SOB with fluid overload. Also developed non-STEMI. Tried on Bipap initially however continued to worsen and was intubated 6/19 -7/2. Developed ARF due to overdiuresis.      PT Comments    Patient tolerating standing longer this session and able to stand pivot with walker back to bed, though leans posteriorly and still needing extensive amount of assist for safety.  Agree with plans for d/c to SNF level rehab at d/c.  Follow Up Recommendations  SNF     Equipment Recommendations  None recommended by PT    Recommendations for Other Services       Precautions / Restrictions Precautions Precautions: Fall    Mobility  Bed Mobility Overal bed mobility: Needs Assistance;+2 for physical assistance Bed Mobility: Sit to Supine       Sit to supine: +2 for physical assistance;Max assist   General bed mobility comments: assist for trunk and LE's to supine  Transfers Overall transfer level: Needs assistance Equipment used: Rolling walker (2 wheeled) Transfers: Sit to/from [employer] Transfers Sit to Stand: +2 physical assistance;Max assist Stand pivot transfers: Max assist;+2 physical assistance       General transfer comment: Placed hands on arms of chair for sit to stand and, though balance initially posterior able to stand several seconds with +2 min/mod assist UE support on walker, then pt sat quickly back on chair,  Second stand pt requesting to sit back on bed.  Pivot with RW and assist to prevent LOB posterior and to maneuver walker +2 assist  Ambulation/Gait             General Gait Details: unable   Stairs            Wheelchair Mobility    Modified Rankin (Stroke Patients Only)       Balance  Overall balance assessment: Needs assistance Sitting-balance support: Feet supported Sitting balance-Leahy Scale: Fair   Postural control: Posterior lean Standing balance support: Bilateral upper extremity supported Standing balance-Leahy Scale: Poor Standing balance comment: max to mod support in standing with UE support                    Cognition Arousal/Alertness: Awake/alert Behavior During Therapy: Anxious Overall Cognitive Status: Impaired/Different from baseline Area of Impairment: Following commands;Attention;Orientation Orientation Level: Place Current Attention Level: Sustained Memory: Decreased short-term memory Following Commands: Follows one step commands with increased time;Follows one step commands inconsistently     Problem Solving: Slow processing;Difficulty sequencing;Requires verbal cues General Comments: states he lives in [place] which is 2-3 miles from here; asked if he wanted to go back to bed, states wants to go home    Exercises General Exercises - Lower Extremity Long Arc Quad: 10 reps;Seated;Both;AROM Hip Flexion/Marching: AROM;Both;15 reps;Seated    General Comments General comments (skin integrity, edema, etc.): Upon entry to room noted pt in recliner and scooted out to edge of seat with feet elevated; nurse tech assisted to scoot back in chair for positioning prior to sitting patient up      Pertinent Vitals/Pain Pain Assessment: Faces Faces Pain Scale: Hurts little more Pain Location: c/o needs to urinate despite foley Pain Intervention(s): Monitored during session;Repositioned    Home Living  Prior Function            PT Goals (current goals can now be found in the care plan section) Progress towards PT goals: Progressing toward goals    Frequency  Min 3X/week    PT Plan Discharge plan needs to be updated    Co-evaluation             End of Session Equipment Utilized During  Treatment: Gait belt Activity Tolerance: Patient limited by fatigue Patient left: in bed;with call bell/phone within reach;with bed alarm set     Time: 1157-1220 PT Time Calculation (min) (ACUTE ONLY): 23 min  Charges:  $Gait Training: 8-22 mins $Therapeutic Exercise: 8-22 mins                    G Codes:      Mianna Iezzi,CYNDI 2015-02-02, 1:14 PM  Sheran Lawless, PT [phone] 02/02/2015

## 2015-01-28 NOTE — Clinical Social Work Placement (Signed)
   CLINICAL SOCIAL WORK PLACEMENT  NOTE  Date:  01/28/2015  Patient Details  Name: Cortlandt Kosky MRN: [id number] Date of Birth: 30-Jan-1933  Clinical Social Work is seeking post-discharge placement for this patient at the Skilled  Nursing Facility level of care (*CSW will initial, date and re-position this form in  chart as items are completed):  Yes   Patient/family provided with [facility] Social Work Department's list of facilities offering this level of care within the geographic area requested by the patient (or if unable, by the patient's family).  Yes   Patient/family informed of their freedom to choose among providers that offer the needed level of care, that participate in Medicare, Medicaid or managed care program needed by the patient, have an available bed and are willing to accept the patient.  Yes   Patient/family informed of 's ownership interest in [facility] and [facility], as well as of the fact that they are under no obligation to receive care at these facilities.  PASRR submitted to EDS on       PASRR number received on       Existing PASRR number confirmed on 01/26/15     FL2 transmitted to all facilities in geographic area requested by pt/family on 01/26/15     FL2 transmitted to all facilities within larger geographic area on 01/27/15 ([facility] in [place] per daughter [name] request. She reported that her father wants this facility if [facility] cannot take him.)     Patient informed that his/her managed care company has contracts with or will negotiate with certain facilities, including the following:        Yes (Bed offers given on 01/27/15. )   Patient/family informed of bed offers received.  Patient chooses bed at  [facility]     Physician recommends and patient chooses bed at      Patient to be transferred to  [facility] on  01/28/15.  Patient to be transferred to facility by   [facility]     Patient family notified on  01/28/15 of transfer.  Name of family member notified:   Daughter Sheela Stack [phone]).     PHYSICIAN Please prepare priority discharge summary, including medications     Additional Comment:    _______________________________________________ Cristobal Goldmann, LCSW 01/28/2015, 2:44 PM

## 2015-01-28 NOTE — Evaluation (Signed)
Occupational Therapy Evaluation Patient Details Name: [name] MRN: [id number] DOB: 03-18-1933 Today's Date: 01/28/2015    History of Present Illness Patient is a 81 y.o. male with pmh of CAD,GERD, TIA, anxiety, depression, DM, CKD, AS. Admitted with SOB with fluid overload. Also developed non-STEMI. Tried on Bipap initially however continued to worsen and was intubated 6/19 -7/2. Developed ARF due to overdiuresis.     Clinical Impression   Pt was independent prior to admission. Pt currently requires +2 assist for all mobility and is dependent in all ADL to varying degrees. He demonstrates impaired cognition.  Plan is for pt to go to SNF for ST rehab prior to return home.  Will defer further OT to SNF.    Follow Up Recommendations  SNF;Supervision/Assistance - 24 hour    Equipment Recommendations       Recommendations for Other Services       Precautions / Restrictions Precautions Precautions: Fall Restrictions Weight Bearing Restrictions: No      Mobility Bed Mobility Overal bed mobility: Needs Assistance;+2 for physical assistance   Transfers            Balance Overall balance assessment: Needs assistance Sitting-balance support: Feet supported Sitting balance-Leahy Scale: Fair                               ADL Overall ADL's : Needs assistance/impaired Eating/Feeding: Maximal assistance;Bed level Eating/Feeding Details (indicate cue type and reason): pt crushes styrofoam cup, will benefit from cup with lid and built up handled utensils Grooming: Wash/dry face;Minimal assistance;Bed level Grooming Details (indicate cue type and reason): decreased thoroughness, but able to bring hands to face with washcloth Upper Body Bathing: Bed level;Maximal assistance   Lower Body Bathing: Total assistance;Bed level   Upper Body Dressing : Moderate assistance;Bed level   Lower Body Dressing: Total assistance;Bed level                        Vision Additional Comments: needs further assessment   Perception     Praxis      Pertinent Vitals/Pain Pain Assessment: No/denies pain Faces Pain Scale: Hurts little more Pain Location: c/o needs to urinate despite foley Pain Intervention(s): Monitored during session;Repositioned     Hand Dominance Right   Extremity/Trunk Assessment Upper Extremity Assessment Upper Extremity Assessment: RUE deficits/detail;LUE deficits/detail RUE Deficits / Details: generalized weakness, arthritic changes in hand RUE Coordination: decreased fine motor;decreased gross motor LUE Deficits / Details: generalized weakness, arthritic changes in hand,  LUE Coordination: decreased fine motor;decreased gross motor   Lower Extremity Assessment Lower Extremity Assessment: Defer to PT evaluation       Communication Communication Communication: Expressive difficulties (low volume)   Cognition Arousal/Alertness: Awake/alert Behavior During Therapy: WFL for tasks assessed/performed Overall Cognitive Status: Impaired/Different from baseline Area of Impairment: Following commands;Attention;Orientation Orientation Level: Place Current Attention Level: Sustained Memory: Decreased short-term memory Following Commands: Follows one step commands with increased time;Follows one step commands inconsistently Safety/Judgement: Decreased awareness of safety;Decreased awareness of deficits   Problem Solving: Slow processing;Difficulty sequencing;Requires verbal cues General Comments: pt asking when he is going to [facility]   General Comments       Exercises       Shoulder Instructions      Home Living Family/patient expects to be discharged to:: Skilled nursing facility  Prior Functioning/Environment Level of Independence: Independent             OT Diagnosis: Generalized weakness;Cognitive deficits   OT Problem List:     OT  Treatment/Interventions:      OT Goals(Current goals can be found in the care plan section) Acute Rehab OT Goals Patient Stated Goal: walk  OT Frequency:     Barriers to D/C:            Co-evaluation              End of Session Equipment Utilized During Treatment: Oxygen  Activity Tolerance: Patient tolerated treatment well Patient left: in bed;with bed alarm set   Time: 1610-9604 OT Time Calculation (min): 20 min Charges:  OT General Charges $OT Visit: 1 Procedure OT Evaluation $Initial OT Evaluation Tier I: 1 Procedure G-Codes:    Evern Bio 01/28/2015, 3:52 PM

## 2015-01-28 NOTE — Discharge Summary (Addendum)
[name], is a 81 y.o. male  DOB February 05, 1933  MRN [id number].  Admission date:  01/09/2015  Admitting Physician  Hillary Bow, DO  Discharge Date:  01/28/2015   Primary MD  [name], MD  Recommendations for primary care physician for things to follow:   Check CBC, CMP 2 view chest x-ray in one week. Must follow with ID physician Dr. Ninetta Lights closely.   Admission Diagnosis  SOB (shortness of breath) [R06.02]   Discharge Diagnosis  SOB (shortness of breath) [R06.02]     Principal Problem:   Acute respiratory failure with hypoxia Active Problems:   Type 2 diabetes mellitus with vascular disease   Dyslipidemia   Hypertensive heart disease   Aortic stenosis   CAD (coronary artery disease)   CKD (chronic kidney disease), stage III   Elevated troponin   Altered mental status   Abnormal involuntary movements   Acute respiratory failure   Encephalopathy   Endotracheally intubated   Pressure ulcer   Staphylococcus epidermidis bacteremia      Past Medical History  Diagnosis Date  . Gout, unspecified     severe dz, "treatment done that last 15 years"  . Obesity (BMI 30-39.9)   . Restless leg syndrome   . History of retinal detachment 1994  . Hypertensive heart disease   . CAD (coronary artery disease), native coronary artery   . History of pericarditis 2007    MSSA, s/p pericardial window  . GERD   . Osteoarthritis   . Sleep apnea in adult     CPAP qhs  . TIA (transient ischemic attack) 2005  . BPH (benign prostatic hypertrophy)     "minor"  . Lumbar spinal stenosis   . Carotid artery occlusion   . Unspecified venous (peripheral) insufficiency   . Stroke May 2003  . Anxiety   . Depression   . Aortic stenosis     ECHO 05/29/14 moderate AS Mean gradient 21 mm EF 55% ECHO 07/09/13   Mean gradient 12 mm Mild to moderate AS EF 50%    . CAD (coronary artery disease)     02/11/09  Xience stent to circumflex 2.5 x 18 mm postdilated to 2.75 mm  Cath sept 2011  normal Left main, 20 % stenosis proximal LAD, 40% stenosis mid LAD, 70% stenosis proximal Diag 1, 90% stenosis prox CFX, 90% first OM, small and nondominant RCA;   . Carotid artery disease     Prior TIA 2006 with left CEA by Dr. Arbie Cookey Recurrent TIA in April 2011   . Type 2 diabetes mellitus with vascular disease   . Lumbar disc disease   . CKD (chronic kidney disease), stage III 02/08/2014  . CHF (congestive heart failure)     Past Surgical History  Procedure Laterality Date  . Pericardial window  2007  . Left arm  2008    shoulder  . Right arm  1970's    shoulder  . Cataract extraction      Left side x's 2 and right  .  Retinal detachment surgery      left side  . Carotid endarterectomy Left 12-06-05    cea  . Coronary angioplasty  01/2009    x 1 stent  . Colonoscopy    . Carpal tunnel release Bilateral   . Back surgery  2013    removed bone spurs  . Inguinal hernia repair Left 10/01/2013    Procedure: HERNIA REPAIR INGUINAL ADULT;  Surgeon: Axel Filler, MD;  Location: [facility];  Service: General;  Laterality: Left;  . Insertion of mesh Left 10/01/2013    Procedure: INSERTION OF MESH;  Surgeon: Axel Filler, MD;  Location: [facility];  Service: General;  Laterality: Left;  . Finger amputation  May 2015    JUST TO FIRST JOINT RIGHT HAND  LAST FINGER ( Right 5th finger)  . Peripheral vascular catheterization N/A 01/08/2015    Procedure: Abdominal Aortogram;  Surgeon: Chuck Hint, MD;  Location: [facility] INVASIVE CV LAB;  Service: Cardiovascular;  Laterality: N/A;       HPI   :    [name] is a 81 y.o. male with pmh of CAD, DM, CKD, AS, he also has a right foot ulcer for the last few months for which he is getting outpatient wound care, Patient presents to the ED with c/o intermittent, ongoing, and  unchanged SOB x2 weeks. Worsened today. SOB is worsened with mild exertion. He has aortic stenosis for a long time and has had exertional dyspnea for several months, he follows with Dr. Donnie Aho who is his cardiologist. He was admitted for acute respiratory failure secondary to possible critical left ear induced left-sided heart failure with pulmonary edema, possible HCAP, he also developed non-ST elevation MI , he was seen by cardiology, he was tried on BiPAP initially however he continued to get worse and was intubated and transferred to pulmonary critical care.   In the hospital he developed's coag-negative staph bacteremia, source is unclear, TTE did not show any vegetation and he remains too frail for a TEE. He was transferred under hospitalist care on day 15 of his hospital stay , I assumed his care on 01/24/2015.     Hospital Course:     1. Acute on chronic hypoxic respiratory failure in the setting of moderate to severe aortic stenosis. Non-ST elevation MI. Pulmonary edema + ? HCAP - was intubated and was under the care of ICU physicians till 01/23/2015, was extubated on 01/23/2015 and answered under my service on 01/24/2015. Pulmonary issues have much improved, he is Currently on room air with nonacute chest x-ray, has finished anti-biotics for HCAP, on aspirin, beta blocker along with Plavix for his non-ST elevation MI. Was seen by pulmonary critical care and cardiology.  Cardiology to decide whether patient is a candidate for left heart cath or not in the light of his ongoing bacteremia, most likely post any new procedure will be in the outpatient setting. We'll discharge today to SNF with outpatient follow-up with cardiology on a close basis.   2. Coag negative bacteremia. Source unclear. Does have a chronic wound on the right foot. Also aortic stenosis, currently on vancomycin. ID input appreciated. Poor candidate for TEE. TTE does not show any frank vegetation. Per ID treat with IV  vancomycin for a total of 1 month, start date 01-19-15. PICC placed in R arm. IV in diabetics. On 02/20/2015. Must follow with ID physician Dr. Ninetta Lights in 1-2 weeks.   3. Dysphagia post extubation. D2 Nectar thick diet, had barium swallow on 01/25/2015.was  seen here by speech, would request SNF to continue speech therapy follow-up and treatment at SNF.   4. Hyponatremia with dehydration and acute renal failure. Hydrated with D5W monitor BMP. Resolved.   5. Hypokalemia. Replaced and stable.   6. Non-ST elevation MI. Cardiology following, currently chest pain-free, on aspirin-Plavix-beta blocker as tolerated orally. Further management per cardiology.   7. History of gout. Resumed allopurinol.   8. GERD. On PPI   9. Essential hypertension. Continue beta blocker.    10. ? DM2 diagnosed in ICU - ISS now, monitor CBGs.   11. BPH - with Ur. Retention - on Flomax and Foley was placed, please remove foley in 2-3 days and monitor, if retains again, replace Foley with urology follow up.         Discharge Condition: Stable  Follow UP  Follow-up Information    Follow up with [name], MD. Schedule an appointment as soon as possible for a visit in 1 week.   Specialty:  Internal Medicine   Contact information:   [address] [place] [address] [phone]       Follow up with Mason Sax, MD. Schedule an appointment as soon as possible for a visit in 1 week.   Specialty:  Infectious Diseases   Why:  endocarditis   Contact information:   [address] [place] [address] [phone]       Follow up with Georga Hacking, MD. Schedule an appointment as soon as possible for a visit in 1 week.   Specialty:  Cardiology   Why:  CAD   Contact information:   [address] [place] [address] [phone]        Consults obtained - PCCM, Cards, ID  Diet and Activity recommendation: See  Discharge Instructions below  Discharge Instructions           Discharge Instructions    Discharge instructions    Complete by:  As directed   Follow with Primary MD [name], MD in 7 days   Get CBC, CMP, 2 view Chest X ray checked  by Primary MD next visit.    Activity: As tolerated with Full fall precautions use walker/cane & assistance as needed   Disposition SNF     Diet: Dysphagia 2 diet with nectar thick liquids, with feeding assistance and aspiration precautions.  For Heart failure patients - Check your Weight same time everyday, if you gain over 2 pounds, or you develop in leg swelling, experience more shortness of breath or chest pain, call your Primary MD immediately. Follow Cardiac Low Salt Diet and 1.5 lit/day fluid restriction.   On your next visit with your primary care physician please Get Medicines reviewed and adjusted.   Please request your Prim.MD to go over all Hospital Tests and Procedure/Radiological results at the follow up, please get all Hospital records sent to your Prim MD by signing hospital release before you go home.   If you experience worsening of your admission symptoms, develop shortness of breath, life threatening emergency, suicidal or homicidal thoughts you must seek medical attention immediately by calling 911 or calling your MD immediately  if symptoms less severe.  You Must read complete instructions/literature along with all the possible adverse reactions/side effects for all the Medicines you take and that have been prescribed to you. Take any new Medicines after you have completely understood and accpet all the possible adverse reactions/side effects.  Do not drive, operating heavy machinery, perform activities at heights, swimming or participation in water activities or provide baby sitting services if your were admitted for syncope or siezures until you have seen by Primary MD or a Neurologist and advised to do so again.  Do  not drive when taking Pain medications.    Do not take more than prescribed Pain, Sleep and Anxiety Medications  Special Instructions: If you have smoked or chewed Tobacco  in the last 2 yrs please stop smoking, stop any regular Alcohol  and or any Recreational drug use.  Wear Seat belts while driving.   Please note  You were cared for by a hospitalist during your hospital stay. If you have any questions about your discharge medications or the care you received while you were in the hospital after you are discharged, you can call the unit and asked to speak with the hospitalist on call if the hospitalist that took care of you is not available. Once you are discharged, your primary care physician will handle any further medical issues. Please note that NO REFILLS for any discharge medications will be authorized once you are discharged, as it is imperative that you return to your primary care physician (or establish a relationship with a primary care physician if you do not have one) for your aftercare needs so that they can reassess your need for medications and monitor your lab values.     Increase activity slowly    Complete by:  As directed              Discharge Medications       Medication List    STOP taking these medications        amLODipine 5 MG tablet  Commonly known as:  NORVASC     LORazepam 1 MG tablet  Commonly known as:  ATIVAN      TAKE these medications        acetaminophen 325 MG tablet  Commonly known as:  TYLENOL  Take 2 tablets (650 mg total) by mouth every 6 (six) hours as needed for mild pain or moderate pain.     allopurinol 300 MG tablet  Commonly known as:  ZYLOPRIM  Take 1 tablet (300 mg total) by mouth every morning.     aspirin EC 81 MG tablet  Take 81 mg by mouth daily.     buPROPion 150 MG 24 hr tablet  Commonly known as:  WELLBUTRIN XL  Take 1 tablet (150 mg total) by mouth every morning.     cholecalciferol 1000 UNITS tablet    Commonly known as:  VITAMIN D  Take 1,000 Units by mouth daily.     clopidogrel 75 MG tablet  Commonly known as:  PLAVIX  Take 1 tablet (75 mg total) by mouth daily.     DSS 100 MG Caps  Take 100 mg by mouth 2 (two) times daily.     feeding supplement (PRO-STAT SUGAR FREE 64) Liqd  Place 60 mLs into feeding tube 2 (two) times daily.     Ferrous Gluconate 256 (28 FE) MG Tabs  Take 256 mg by mouth daily.     furosemide 40 MG tablet  Commonly known as:  LASIX  Take 1 tablet (40 mg total) by mouth daily.     insulin aspart 100 UNIT/ML injection  Commonly known as:  NOVOLOG  Before each meal 3 times a day, 140-199 - 2 units, 200-250 - 4 units, 251-299 - 6 units,  300-349 - 8 units,  350 or above 10 units. Dispense syringes and needles as needed, Ok to switch to PEN if approved. Substitute to any brand approved. DX DM2, Code E11.9     Magnesium 250 MG Tabs  Take 250 mg by mouth daily.     metoprolol succinate 50 MG 24 hr tablet  Commonly known as:  TOPROL-XL  Take 1 tablet (50 mg total) by mouth daily. Take with or immediately following a meal.     mirabegron ER 50 MG Tb24 tablet  Commonly known as:  MYRBETRIQ  Take 1 tablet (50 mg total) by mouth daily.     mirtazapine 15 MG tablet  Commonly known as:  REMERON  Take 1 tablet (15 mg total) by mouth at bedtime.     pantoprazole 40 MG tablet  Commonly known as:  PROTONIX  Take 1 tablet (40 mg total) by mouth 2 (two) times daily.     pregabalin 200 MG capsule  Commonly known as:  LYRICA  Take 1 capsule (200 mg total) by mouth at bedtime.     rOPINIRole 4 MG tablet  Commonly known as:  REQUIP  Take 1 tablet (4 mg total) by mouth at bedtime.     sodium chloride 0.65 % Soln nasal spray  Commonly known as:  OCEAN  Place 1 spray into both nostrils as needed for congestion.     tamsulosin 0.4 MG Caps capsule  Commonly known as:  FLOMAX  Take 1 capsule (0.4 mg total) by mouth daily.     vancomycin 1,250 mg in sodium  chloride 0.9 % 250 mL  Inject 1,250 mg into the vein every 12 (twelve) hours. Via PICC Line, dose to be monitored by SNF pharmacy. Draw peak and trough levels twice a week and forwarded to the ID physician Dr. Moshe Cipro office. Stop date is 02/20/2015.        Major procedures and Radiology Reports - PLEASE review detailed and final reports for all details, in brief -     6/18 CT ABD >> post-op changes in L groin, increased density in bladder, bibasilar atx 6/18 ECHO >> LVEF 55-60%, grade II diastolic dysfunction, moderate AS (wosening since last ECHO) 6/20 EEG>>>metabolic vs drug affect, no focus  6/19 CT of Head >> Stable moderate diffuse cerebral atrophy and mild chronic small vessel white matter ischemic changes 6/20 Echo: Left ventricle: The cavity size was normal. Wall thickness was normal. Systolic function was normal. The estimated ejection fraction was in the range of 60% to 65%. Wall motion was normal;there were no regional wall motion abnormalities. 6/22: MR Brain: No infarct, age related atrophy, mild chronic small vessel disease 7/04//2016. Modified barium swallow  01-26-15 - R arm PICC  SIGNIFICANT EVENTS: 6/17 RLE arteriography per Dr. Edilia Bo for PVD and non-healing R toe wound 6/18 Admit to [facility] with complaints of SOB x 2 weeks 6/19 Decompensated early am, required intubation / ICU transfer 6/20-trop elevation, hep , asa 6/21: polymyoclonus with when precedex stopped 6/22: continued plt count drop to 93 down from 131,agitation required change in sedation to prop 6/23: worsening chest film, plt stable 7/2>> staph bacteremia     Dg Chest 2 View  01/09/2015   CLINICAL DATA:  81 year old male with shortness of Breath, recent hospitalization for lower extremity swelling and shortness of Breath. Subsequent encounter.  EXAM: CHEST  2 VIEW  COMPARISON:  07/17/2014 and earlier.  FINDINGS: Semi upright AP and lateral views of the chest. Stable cardiomegaly and mediastinal  contours. No pneumothorax, pulmonary edema, pleural effusion or confluent pulmonary opacity. No acute osseous abnormality identified.  IMPRESSION: No acute cardiopulmonary abnormality.   Electronically Signed   By: Odessa Fleming M.D.   On: 01/09/2015 01:26   Ct Head Wo Contrast  01/10/2015   CLINICAL DATA:  Altered mental status.  EXAM: CT HEAD WITHOUT CONTRAST  TECHNIQUE: Contiguous axial images were obtained from the base of the skull through the vertex without intravenous contrast.  COMPARISON:  07/13/2014.  FINDINGS: Diffusely enlarged ventricles and subarachnoid spaces. Patchy white matter low density in both cerebral hemispheres. Stable small, more focal area of low density in the left frontoparietal white matter. No intracranial hemorrhage, mass lesion or CT evidence of acute infarction. Unremarkable bones and included paranasal sinuses.  IMPRESSION: 1. No acute abnormality. 2. Stable moderate diffuse cerebral atrophy and mild chronic small vessel white matter ischemic changes in both cerebral hemispheres. 3. 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   Electronically Signed   By: Beckie Salts M.D.   On: 01/10/2015 12:34   Mr Brain Wo Contrast  01/14/2015   CLINICAL DATA:  Initial evaluation for acute encephalopathy.  EXAM: MRI HEAD WITHOUT CONTRAST  TECHNIQUE: Multiplanar, multiecho pulse sequences of the brain and surrounding structures were obtained without intravenous contrast.  COMPARISON:  Prior CT from 01/10/2015  FINDINGS: Diffuse prominence of the CSF containing spaces is compatible with generalized cerebral atrophy. There are scattered patchy T2/FLAIR hyperintense foci within the periventricular white matter, nonspecific, but likely related to chronic small vessel ischemic disease. These are similar relative to prior MRI.  No abnormal foci of restricted diffusion to suggest acute intracranial infarct. Gray-white matter differentiation maintained. Normal intravascular flow voids are  preserved. No acute or chronic intracranial hemorrhage.  No mass lesion, mass effect, or midline shift. No hydrocephalus. No extra-axial fluid collection. Hippocampi symmetric in size with normal morphology and signal intensity.  Craniocervical junction within normal limits. Pituitary gland normal.  No acute abnormality about the orbits. Sequela prior bilateral lens extraction noted.  Fluid density present within the nasopharynx, likely related intubation. Scattered mucosal thickening present within the sphenoid sinuses and ethmoidal air cells. Bilateral mastoid effusions present. Inner ear structures normal.  Scattered degenerative changes present within the visualized upper cervical spine. Bone marrow signal intensity within normal limits. Scalp soft tissues unremarkable.  IMPRESSION: 1. No acute intracranial infarct or other process identified. 2. Generalized age-related cerebral atrophy with mild chronic small vessel ischemic disease.   Electronically Signed   By: Rise Mu M.D.   On: 01/14/2015 04:30   Ct Abdomen Pelvis W Contrast  01/09/2015   CLINICAL DATA:  Intermittent ongoing shortness of breath for 2 weeks, worse today. Abdominal pain for a few days with nausea. Recent discharge from hospital early this morning after stent placed in the lower extremity.  EXAM: CT ABDOMEN AND PELVIS WITH CONTRAST  TECHNIQUE: Multidetector CT imaging of the abdomen and pelvis was performed using the standard protocol following bolus administration of intravenous contrast.  CONTRAST:  80mL OMNIPAQUE IOHEXOL 300 MG/ML  SOLN  COMPARISON:  None.  FINDINGS: Atelectasis in the lung bases. Coronary artery and aortic valve calcifications.  Mild diffuse fatty infiltration of the liver. No focal liver lesions. The gallbladder, spleen, pancreas, adrenal glands, kidneys, inferior vena cava, and retroperitoneal lymph nodes are unremarkable. Calcification of the aorta without aneurysm. Calcification of the renal artery  origins. Stomach, small bowel, and colon are not abnormally distended. No discrete wall thickening is noted. No  free air or free fluid in the abdomen. Abdominal wall musculature appears intact.  Pelvis: The appendix is normal. Prostate gland is enlarged. There is increased density in the bladder which could be early contrast or may indicate hemorrhage. Correlation with urinalysis is recommended. No bladder wall thickening. Mildly enlarged lymph node in the left external iliac chain measuring 16 mm diameter and in the right iliac chain measuring 13 mm diameter, probably reactive. Skin defect and infiltration in the left groin is probably postoperative. Small right inguinal hernia containing fat. Degenerative changes in the spine. No destructive bone lesions. Postoperative laminectomies at L3-4, L4-5, and L5-S1.  IMPRESSION: Postoperative changes in the left groin region with infiltration in the subcutaneous fat, likely postoperative. Prominent lymph nodes in the iliac chains bilaterally are most likely to be reactive. Increased density in the bladder suggest possible hemorrhage. Correlation with urinalysis. Right inguinal hernia containing fat.   Electronically Signed   By: Burman Nieves M.D.   On: 01/09/2015 04:16   Dg Chest Port 1 View  01/24/2015   CLINICAL DATA:  Respiratory failure. Coronary disease. Hypertensive heart disease.  EXAM: PORTABLE CHEST - 1 VIEW  COMPARISON:  1 a prior  FINDINGS: Extubation. Removal of nasogastric tube. Midline trachea. Borderline cardiomegaly. No pleural effusion or pneumothorax. Clearing of right base airspace disease. Mild left base atelectasis remains.  IMPRESSION: Extubation and removal of nasogastric tube.  Improved aeration with mild left base atelectasis remaining.   Electronically Signed   By: Jeronimo Greaves M.D.   On: 01/24/2015 09:15   Dg Chest Port 1 View  01/23/2015   CLINICAL DATA:  Acute respiratory failure  EXAM: PORTABLE CHEST - 1 VIEW  COMPARISON:  01/21/2015   FINDINGS: Endotracheal tube is 5-6 cm above the carina, slightly retracted since prior study. NG tube enters the stomach. Bibasilar airspace opacities are noted, left greater than right, likely atelectasis. No significant change since prior study. Heart is upper limits normal in size. No significant visible effusions.  IMPRESSION: Continued bibasilar opacities, likely atelectasis, left greater than right.   Electronically Signed   By: Charlett Nose M.D.   On: 01/23/2015 08:35   Dg Chest Port 1 View  01/21/2015   CLINICAL DATA:  Respiratory failure  EXAM: PORTABLE CHEST - 1 VIEW  COMPARISON:  01/20/2015  FINDINGS: The endotracheal tube is 2.7 cm above the carina. The nasogastric tube extends into the stomach. Central and basilar opacities persist bilaterally without significant interval change. There is no pneumothorax  IMPRESSION: Support equipment appears satisfactorily positioned.  No significant interval change in the bilateral airspace opacities.   Electronically Signed   By: Ellery Plunk M.D.   On: 01/21/2015 07:06   Dg Chest Port 1 View  01/20/2015   CLINICAL DATA:  Acute respiratory failure  EXAM: PORTABLE CHEST - 1 VIEW  COMPARISON:  01/19/2015  FINDINGS: Bibasilar airspace opacities could reflect atelectasis or infiltrates. This has increased slightly in the left base. No significant visible effusions. Heart is borderline in size. Support devices are unchanged.  IMPRESSION: Bibasilar airspace opacities, slightly increased on the left. This could represent atelectasis or infiltrate/pneumonia.   Electronically Signed   By: Charlett Nose M.D.   On: 01/20/2015 07:28   Dg Chest Port 1 View  01/19/2015   CLINICAL DATA:  Acute respiratory failure.  EXAM: PORTABLE CHEST - 1 VIEW  COMPARISON:  01/18/2015  FINDINGS: The endotracheal tube is not well seen but probably unchanged in position with tip around the level of the  clavicular heads. Nasogastric tube extends into the stomach. Left jugular central  line extends into the SVC. Minor airspace opacities persist bilaterally without significant interval change.  IMPRESSION: Support equipment appears satisfactorily positioned.  No significant interval change in the bilateral airspace opacities.   Electronically Signed   By: Ellery Plunk M.D.   On: 01/19/2015 04:39   Dg Chest Port 1 View  01/18/2015   CLINICAL DATA:  Pulmonary infiltrates  EXAM: PORTABLE CHEST - 1 VIEW  COMPARISON:  01/18/2015 at 0518 hours  FINDINGS: Low lung volumes.  Patchy left lower lobe/ perihilar opacity, atelectasis versus pneumonia.  Possible mild perihilar edema versus vascular crowding. No pleural effusion or pneumothorax.  Endotracheal tube terminates 5.5 cm above the carina.  The heart is normal in size.  Enteric tube courses into the stomach.  IMPRESSION: Patchy left lower lobe/ perihilar opacity, atelectasis versus pneumonia.  Low lung volumes with mild perihilar edema versus vascular crowding.  Endotracheal tube terminates 5.5 cm above the carina.   Electronically Signed   By: Charline Bills M.D.   On: 01/18/2015 14:27   Dg Chest Port 1 View  01/18/2015   CLINICAL DATA:  Pneumonia, history of obesity, hypertensive heart disease, coronary artery disease, hypertension, type II diabetes mellitus, CHF, stage 3 chronic kidney disease  EXAM: PORTABLE CHEST - 1 VIEW  COMPARISON:  Portable exam 0518 hours compared to 01/17/2015  FINDINGS: Tip of endotracheal tube projects 6.3 cm above carina.  Nasogastric tube extends into stomach.  LEFT jugular central venous catheter with tip projecting over LEFT brachiocephalic vein.  Enlargement of cardiac silhouette with pulmonary vascular congestion.  Decreased lung volumes with atelectasis versus consolidation LEFT lower lobe.  Upper lungs clear.  No gross pleural effusion or pneumothorax.  IMPRESSION: Atelectasis versus consolidation LEFT lower lobe.  Enlargement of cardiac silhouette with pulmonary vascular congestion.   Electronically  Signed   By: Ulyses Southward M.D.   On: 01/18/2015 07:26   Dg Chest Port 1 View  01/17/2015   CLINICAL DATA:  Pulmonary edema. History of CHF, diabetes, hypertension, renal insufficiency, and CAD.  EXAM: PORTABLE CHEST - 1 VIEW  COMPARISON:  01/16/2015  FINDINGS: Endotracheal tube is in place with tip approximately 6 cm above carina. The nasogastric tube is in place with tip off the film but beyond the gastroesophageal junction. Left IJ central line tip overlies the level of the brachiocephalic-SVC confluence.  Patient is rotated. Heart is enlarged. There is persistent density at the left lung base better seen or slightly increased. Suspect left pleural effusion.  IMPRESSION: 1. Cardiomegaly. 2. Left lower lobe opacity persists.   Electronically Signed   By: Norva Pavlov M.D.   On: 01/17/2015 09:04   Dg Chest Port 1 View  01/16/2015   CLINICAL DATA:  Pulmonary edema  EXAM: PORTABLE CHEST - 1 VIEW  COMPARISON:  01/15/2015  FINDINGS: Cardiac shadow is stable. The endotracheal tube and nasogastric catheter are stable in position. A left jugular line is again noted in the proximal superior vena cava. No pneumothorax is noted. The inspiratory effort is poor but somewhat improved from the prior exam. Improved aeration is noted in the bases bilaterally.  IMPRESSION: Overall poor inspiratory effort but improved aeration in the basis when compared with the prior exam.  Tubes and lines stable in appearance.   Electronically Signed   By: Alcide Clever M.D.   On: 01/16/2015 07:25   Dg Chest Port 1 View  01/15/2015   CLINICAL DATA:  Intubation.  EXAM: PORTABLE CHEST - 1 VIEW  COMPARISON:  01/14/2015 .  FINDINGS: Endotracheal tube, NG tube, left IJ line in stable position. Cardiomegaly with pulmonary vascular prominence and progressive bilateral bilateral infiltrates. Bilateral small pleural effusions. Findings consistent with progressive congestive heart failure. Bibasilar pneumonia cannot be excluded. Low lung volumes  with bibasilar atelectasis. No pneumothorax.  IMPRESSION: 1.  Lines and tubes in stable position.  2. Progressive changes of congestive heart failure and pulmonary edema. Superimposed pneumonia cannot be excluded.  3.  Low lung volumes with bibasilar atelectasis.   Electronically Signed   By: Maisie Fus  Register   On: 01/15/2015 07:59   Dg Chest Port 1 View  01/14/2015   CLINICAL DATA:  Endotracheal tube  EXAM: PORTABLE CHEST - 1 VIEW  COMPARISON:  01/13/2015; 01/12/2015; 01/11/2015  FINDINGS: Grossly unchanged enlarged cardiac silhouette and mediastinal contours given persistently reduced lung volumes and patient rotation. Stable position of support apparatus. No pneumothorax. Pulmonary vasculature remains indistinct with cephalization of flow. Grossly unchanged perihilar heterogeneous opacities, left greater than right. Chest pleural effusions are not excluded. No new discrete focal airspace opacities. Unchanged bones.  IMPRESSION: 1.  Stable positioning of support apparatus.  No pneumothorax. 2. Similar findings of pulmonary edema and perihilar opacities, left greater than right, atelectasis versus infiltrate.   Electronically Signed   By: Simonne Come M.D.   On: 01/14/2015 07:33   Dg Chest Port 1 View  01/13/2015   CLINICAL DATA:  Assess ETT.  EXAM: PORTABLE CHEST - 1 VIEW  COMPARISON:  01/12/2015  FINDINGS: Endotracheal tube remains 4 cm above the carina, unchanged. NG tube enters the stomach. Left central line tip is in the upper SVC, also unchanged.  There is cardiomegaly with vascular congestion. Improving interstitial prominence. Suspect small left pleural effusion. No definite effusion on the right. No acute bony abnormality.  IMPRESSION: Improving interstitial edema pattern.  Small left pleural effusion.   Electronically Signed   By: Charlett Nose M.D.   On: 01/13/2015 07:36   Dg Chest Port 1 View  01/12/2015   CLINICAL DATA:  Myocardial infarct.  EXAM: PORTABLE CHEST - 1 VIEW  COMPARISON:   01/11/2015.  FINDINGS: Endotracheal tube, left IJ line, NG tube in stable position. Cardiomegaly with pulmonary vascular prominence and diffuse interstitial prominence consistent with congestive heart failure. Small pleural effusions cannot be excluded. Low lung volumes with basilar atelectasis. No pneumothorax.  IMPRESSION: 1. Lines and tubes in stable position. 2. Cardiomegaly with pulmonary venous congestion bilateral interstitial prominence. Small pleural effusions cannot be excluded. These findings are consistent with congestive heart failure. 3. Low lung volumes with basilar atelectasis.   Electronically Signed   By: Maisie Fus  Register   On: 01/12/2015 07:22   Dg Chest Port 1 View  01/11/2015   CLINICAL DATA:  Acute respiratory failure  EXAM: PORTABLE CHEST - 1 VIEW  COMPARISON:  01/10/2015; 01/09/2015; 07/17/2014  FINDINGS: Grossly unchanged borderline enlarged cardiac silhouette and mediastinal contours given persistently reduced lung volumes and patient rotation. Stable position of support apparatus. No pneumothorax. Improved aeration of the left mid and right lower lung. Improved aeration of left lower lung with residual left basilar/retrocardiac heterogeneous/consolidative opacities. No new focal airspace opacities. Persistent blunting of left costophrenic potentially indicated of a trace left-sided effusion. Pulmonary vasculature remains indistinct. Unchanged bones.  IMPRESSION: 1.  Stable positioning of support apparatus.  No pneumothorax. 2. Overall improved aeration the lungs suggests resolving edema and/or atelectasis. 3. Residual left basilar/retrocardiac opacities, atelectasis versus infiltrate. Continued attention on  follow-up is recommended.   Electronically Signed   By: Simonne Come M.D.   On: 01/11/2015 07:56   Dg Chest Port 1 View  01/10/2015   CLINICAL DATA:  Encounter for central line placement.  EXAM: PORTABLE CHEST - 1 VIEW  COMPARISON:  Same day.  FINDINGS: Stable cardiomediastinal  silhouette. Endotracheal tube tip is 3.4 cm above the carina. Nasogastric tube is seen entering the stomach. No pneumothorax is noted. There is been interval placement of left internal jugular catheter line with distal tip in the expected position of the SVC. Minimal right basilar subsegmental atelectasis is noted. Stable lingular and left lower lobe interstitial and airspace opacity is noted concerning for edema or pneumonia.  IMPRESSION: Lingular left lower lobe interstitial and airspace opacity concerning for edema or pneumonia. Endotracheal and nasogastric tubes are in grossly good position. Interval placement of left internal jugular catheter line with distal tip overlying expected position of SVC.   Electronically Signed   By: Lupita Raider, M.D.   On: 01/10/2015 09:51   Portable Chest Xray  01/10/2015   CLINICAL DATA:  Endotracheal tube  EXAM: PORTABLE CHEST - 1 VIEW  COMPARISON:  01/09/2015  FINDINGS: Endotracheal tube tip is 3.2 cm from the carina. NG tube tip is beyond the gastroesophageal junction. Diffuse bilateral hazy airspace opacities left greater than right. Cardiomegaly. No pneumothorax.  IMPRESSION: New bilateral airspace disease left greater than right.  Endotracheal and NG tubes have been placed.  Cardiomegaly.   Electronically Signed   By: Jolaine Click M.D.   On: 01/10/2015 08:24   Dg Abd Portable 1v  01/19/2015   CLINICAL DATA:  Vomiting and abdominal pain for 1 day.  EXAM: PORTABLE ABDOMEN - 1 VIEW  COMPARISON:  01/13/2015, CT 01/09/2015  FINDINGS: No dilated bowel loops to suggest obstruction. No evidence of free air and mass single portable view. Projecting over the mid lower pelvis is a linear/curvilinear density. No radiopaque calculi. Degenerative change throughout spine.  IMPRESSION: 1. No evidence of bowel obstruction or free air on this single view. 2. Linear/curvilinear density projecting over the central pelvis, this is not seen on prior exams. This may be external to the  patient. This is otherwise nonspecific and of uncertain etiology.   Electronically Signed   By: Rubye Oaks M.D.   On: 01/19/2015 18:36   Dg Abd Portable 1v  01/13/2015   CLINICAL DATA:  Abdominal distention  EXAM: PORTABLE ABDOMEN - 1 VIEW  COMPARISON:  01/10/2015  FINDINGS: Nasogastric catheter is again noted within the stomach. Scattered large and small bowel gas is seen. No obstructive changes are noted. Degenerative change of the lumbar spine is seen as well as changes of prior laminectomy.  IMPRESSION: No acute abnormality noted.   Electronically Signed   By: Alcide Clever M.D.   On: 01/13/2015 11:36   Dg Abd Portable 1v  01/10/2015   CLINICAL DATA:  Orogastric tube placement  EXAM: PORTABLE ABDOMEN - 1 VIEW  COMPARISON:  CT 01/09/2015  FINDINGS: Orogastric tube tip terminates over the expected location of the distal stomach/duodenum bulb. Retained contrast within nondilated colon. No gaseous bowel distention.  IMPRESSION: Tip of orogastric tube over the expected location of the distal stomach/duodenum bulb.   Electronically Signed   By: Christiana Pellant M.D.   On: 01/10/2015 10:05   Dg Swallowing Func-speech Pathology  01/25/2015    Objective Swallowing Evaluation:    Patient Details  Name: [name] MRN: [id number] Date of Birth: 02-09-1933  Today's Date: 01/25/2015 Time: SLP Start Time (ACUTE ONLY): 1606-SLP Stop Time (ACUTE ONLY): 1617 SLP Time Calculation (min) (ACUTE ONLY): 11 min  Past Medical History:  Past Medical History  Diagnosis Date  . Gout, unspecified     severe dz, "treatment done that last 15 years"  . Obesity (BMI 30-39.9)   . Restless leg syndrome   . History of retinal detachment 1994  . Hypertensive heart disease   . CAD (coronary artery disease), native coronary artery   . History of pericarditis 2007    MSSA, s/p pericardial window  . GERD   . Osteoarthritis   . Sleep apnea in adult     CPAP qhs  . TIA (transient ischemic attack) 2005  . BPH (benign prostatic hypertrophy)      "minor"  . Lumbar spinal stenosis   . Carotid artery occlusion   . Unspecified venous (peripheral) insufficiency   . Stroke May 2003  . Anxiety   . Depression   . Aortic stenosis     ECHO 05/29/14 moderate AS Mean gradient 21 mm EF 55% ECHO 07/09/13  Mean  gradient 12 mm Mild to moderate AS EF 50%    . CAD (coronary artery disease)     02/11/09  Xience stent to circumflex 2.5 x 18 mm postdilated to 2.75 mm   Cath sept 2011  normal Left main, 20 % stenosis proximal LAD, 40% stenosis  mid LAD, 70% stenosis proximal Diag 1, 90% stenosis prox CFX, 90% first  OM, small and nondominant RCA;   . Carotid artery disease     Prior TIA 2006 with left CEA by Dr. Arbie Cookey Recurrent TIA in April 2011   . Type 2 diabetes mellitus with vascular disease   . Lumbar disc disease   . CKD (chronic kidney disease), stage III 02/08/2014  . CHF (congestive heart failure)    Past Surgical History:  Past Surgical History  Procedure Laterality Date  . Pericardial window  2007  . Left arm  2008    shoulder  . Right arm  1970's    shoulder  . Cataract extraction      Left side x's 2 and right  . Retinal detachment surgery      left side  . Carotid endarterectomy Left 12-06-05    cea  . Coronary angioplasty  01/2009    x 1 stent  . Colonoscopy    . Carpal tunnel release Bilateral   . Back surgery  2013    removed bone spurs  . Inguinal hernia repair Left 10/01/2013    Procedure: HERNIA REPAIR INGUINAL ADULT;  Surgeon: Axel Filler, MD;   Location: [facility];  Service: General;  Laterality: Left;  . Insertion of mesh Left 10/01/2013    Procedure: INSERTION OF MESH;  Surgeon: Axel Filler, MD;  Location:  [facility];  Service: General;  Laterality: Left;  . Finger amputation  May 2015    JUST TO FIRST JOINT RIGHT HAND  LAST FINGER ( Right 5th finger)  . Peripheral vascular catheterization N/A 01/08/2015    Procedure: Abdominal Aortogram;  Surgeon: Chuck Hint, MD;   Location: [facility] INVASIVE CV LAB;  Service: Cardiovascular;  Laterality: N/A;   HPI:   Other Pertinent Information: [name] is a 81 y.o. male with pmh of  CAD,GERD, TIA, anxiety, depression,  DM, CKD, AS, he also has a right foot  ulcer for the last few months for which he is getting outpatient wound  care, Patient presents to  the ED with c/o intermittent, ongoing, and  unchanged SOB x2 weeks, possible heart failure with pulmonary edema,  possible HCAP. No acute abnormalities noted on initial CXR. Also developed  non-STEMI. Tried on Bipap initially however continued to worsen and was  intubated 6/19 -7/2. Neurology consulted following decline due to noted  jerking lingual movements however head CT negative.   No Data Recorded  Assessment / Plan / Recommendation CHL IP CLINICAL IMPRESSIONS 01/25/2015  Therapy Diagnosis Mild oral phase dysphagia;Mild pharyngeal phase  dysphagia   Clinical Impression Pt has a mild oropharyngeal dysphagia s/p prolonged  intubation, characterized by reduced strength and sensation. Oral phase is  mildly prolonged with solids requiring mastication, although otherwise  without difficulty noted. Swallow initiation is delayed, which results in  silent penetration of thin liquids which while not large in quantity,  cannot be cleared with cued throat clearing. Mild-moderate residue remains  in the valleculae with all consistencies tested, but is reduced with dry  swallows which occasionally occur spontaneously, and other times require  cueing from SLP. Recommend Dys 2 diet and nectar thick liquids with  prognosis for advancement good with increased time post-extubation. Pt may  continue to have ice chips PRN after oral care, as long as it is at least  30 minutes after other PO intake.      CHL IP TREATMENT RECOMMENDATION 01/25/2015  Treatment Recommendations Therapy as outlined in treatment plan below     CHL IP DIET RECOMMENDATION 01/25/2015  SLP Diet Recommendations Dysphagia 2 (Fine chop);Nectar;Ice chips PRN  after oral care  Liquid Administration via (None)  Medication  Administration Whole meds with puree  Compensations Slow rate;Small sips/bites;Multiple dry swallows after each  bite/sip  Postural Changes and/or Swallow Maneuvers (None)     CHL IP OTHER RECOMMENDATIONS 01/25/2015  Recommended Consults (None)  Oral Care Recommendations Oral care BID;Other (Comment)  Other Recommendations Order thickener from pharmacy;Prohibited food  (jello, ice cream, thin soups);Remove water pitcher     No flowsheet data found.   CHL IP FREQUENCY AND DURATION 01/25/2015  Speech Therapy Frequency (ACUTE ONLY) min 2x/week  Treatment Duration 2 weeks     Pertinent Vitals/Pain: n/a     SLP Swallow Goals     CHL IP REASON FOR REFERRAL 01/25/2015  Reason for Referral Objectively evaluate swallowing function     CHL IP ORAL PHASE 01/25/2015  Oral Phase Impaired      CHL IP PHARYNGEAL PHASE 01/25/2015  Pharyngeal Phase Impaired      CHL IP CERVICAL ESOPHAGEAL PHASE 01/25/2015  Cervical Esophageal Phase [facility]          Maxcine Ham, M.A. CCC-SLP [phone]  Maxcine Ham 01/25/2015, 4:46 PM     Micro Results      Recent Results (from the past 240 hour(s))  Culture, blood (routine x 2)     Status: None   Collection Time: 01/19/15  9:50 AM  Result Value Ref Range Status   Specimen Description BLOOD LEFT HAND  Final   Special Requests BOTTLES DRAWN AEROBIC AND ANAEROBIC  10 CC EACH  Final   Culture  Setup Time   Final    GRAM POSITIVE COCCI IN CLUSTERS IN PAIRS AEROBIC BOTTLE ONLY CRITICAL RESULT CALLED TO, READ BACK BY AND VERIFIED WITH: G HARDUK,RN 6/29 0539 RHOLMES CONFIRMED BY R GREEN    Culture STAPHYLOCOCCUS SPECIES (COAGULASE NEGATIVE)  Final   Report Status 01/24/2015 FINAL  Final   Organism ID, Bacteria STAPHYLOCOCCUS SPECIES (COAGULASE NEGATIVE)  Final  Susceptibility   Staphylococcus species (coagulase negative) - MIC*    CIPROFLOXACIN 4 RESISTANT Resistant     GENTAMICIN <=0.5 SENSITIVE Sensitive     OXACILLIN >=4 RESISTANT Resistant     VANCOMYCIN 2 SENSITIVE Sensitive      TRIMETH/SULFA 160 RESISTANT Resistant     CLINDAMYCIN <=0.25 SENSITIVE Sensitive     RIFAMPIN <=0.5 SENSITIVE Sensitive     Inducible Clindamycin NEGATIVE Sensitive     * STAPHYLOCOCCUS SPECIES (COAGULASE NEGATIVE)  Culture, respiratory (NON-Expectorated)     Status: None   Collection Time: 01/19/15 10:15 AM  Result Value Ref Range Status   Specimen Description TRACHEAL ASPIRATE  Final   Special Requests NONE  Final   Gram Stain   Final    MODERATE WBC PRESENT,BOTH PMN AND MONONUCLEAR NO SQUAMOUS EPITHELIAL CELLS SEEN NO ORGANISMS SEEN Performed at [facility]    Culture   Final    Non-Pathogenic Oropharyngeal-type Flora Isolated. Performed at [facility]    Report Status 01/21/2015 FINAL  Final  Urine culture     Status: None   Collection Time: 01/19/15 10:21 AM  Result Value Ref Range Status   Specimen Description URINE, CATHETERIZED  Final   Special Requests NONE  Final   Culture NO GROWTH 1 DAY  Final   Report Status 01/20/2015 FINAL  Final  Culture, blood (routine x 2)     Status: None   Collection Time: 01/19/15 10:24 AM  Result Value Ref Range Status   Specimen Description BLOOD RIGHT HAND  Final   Special Requests BOTTLES DRAWN AEROBIC AND ANAEROBIC 10 CC EACH  Final   Culture  Setup Time   Final    GRAM POSITIVE COCCI IN CLUSTERS IN PAIRS AEROBIC BOTTLE ONLY CRITICAL RESULT CALLED TO, READ BACK BY AND VERIFIED WITH: G HARDUK,RN 01/20/15 0539 RHOLMES CONFIRMED BY R GREEN    Culture   Final    STAPHYLOCOCCUS SPECIES (COAGULASE NEGATIVE) SUSCEPTIBILITIES PERFORMED ON PREVIOUS CULTURE WITHIN THE LAST 5 DAYS.    Report Status 01/24/2015 FINAL  Final  Clostridium Difficile by PCR (not at [facility])     Status: None   Collection Time: 01/21/15 12:10 PM  Result Value Ref Range Status   C difficile by pcr NEGATIVE NEGATIVE Final  Culture, blood (routine x 2)     Status: None (Preliminary result)   Collection Time: 01/24/15  3:00 PM  Result Value Ref  Range Status   Specimen Description BLOOD LEFT ARM  Final   Special Requests BOTTLES DRAWN AEROBIC AND ANAEROBIC 10CC  Final   Culture NO GROWTH 3 DAYS  Final   Report Status PENDING  Incomplete       Today   Subjective    [name] today has no headache,no chest abdominal pain,no new weakness tingling or numbness, feels much better.   Objective   Blood pressure 141/56, pulse 95, temperature 98.6 F (37 C), temperature source Oral, resp. rate 22, height 5\' 9"  (1.753 m), weight 103.4 kg (227 lb 15.3 oz), SpO2 98 %.   Intake/Output Summary (Last 24 hours) at 01/28/15 0856 Last data filed at 01/28/15 0620  Gross per 24 hour  Intake     50 ml  Output   3125 ml  Net  -3075 ml    Exam Awake , mildly confused, No new F.N deficits, Normal affect [place].AT,PERRAL Supple Neck,No JVD, No cervical lymphadenopathy appriciated.  Symmetrical Chest wall movement, Good air movement bilaterally, CTAB RRR,No Gallops,Rubs or new Murmurs, No Parasternal  Heave +ve B.Sounds, Abd Soft, Non tender, No organomegaly appriciated, No rebound -guarding or rigidity. No Cyanosis, Clubbing or edema, No new Rash or bruise   Data Review   CBC w Diff:  Lab Results  Component Value Date   WBC 8.7 01/25/2015   HGB 14.4 01/25/2015   HCT 47.5 01/25/2015   PLT 161 01/25/2015   LYMPHOPCT 12 01/09/2015   MONOPCT 9 01/09/2015   EOSPCT 2 01/09/2015   BASOPCT 0 01/09/2015    CMP:  Lab Results  Component Value Date   NA 141 01/27/2015   K 4.0 01/27/2015   CL 109 01/27/2015   CO2 24 01/27/2015   BUN 16 01/27/2015   CREATININE 0.86 01/27/2015   PROT 8.9* 01/19/2015   ALBUMIN 3.1* 01/19/2015   BILITOT 0.7 01/19/2015   ALKPHOS 51 01/19/2015   AST 76* 01/19/2015   ALT 34 01/19/2015  .  Lab Results  Component Value Date   HGBA1C 6.2* 07/15/2014    CBG (last 3)   Recent Labs  01/28/15 0028 01/28/15 0517 01/28/15 0741  GLUCAP 122* 108* 122*     Total Time in preparing paper work,  data evaluation and todays exam - 35 minutes  Leroy Sea M.D on 01/28/2015 at 8:56 AM  [facility]   Office  [phone]

## 2015-01-28 NOTE — Progress Notes (Addendum)
Speech Language Pathology Treatment: Dysphagia  Patient Details Name: [name] MRN: [id number] DOB: 1933-01-26 Today's Date: 01/28/2015 Time: 9021-1155 SLP Time Calculation (min) (ACUTE ONLY): 15 min  Assessment / Plan / Recommendation Clinical Impression  Pt repositioned for PO trials, appeared confused with delayed response time with basic questions and commands. Still significantly dysphonic. Provided trials of puree which pt tolerated well with hand over hand assist for self feeding and max verbal cues for second swallow. Thin liquids now result in immediate cough, indicative of ongoing, decreased airway protection, but improving sensation. Recommend pt continue nectar thick liquids and dys 2 solids. May benefit from cognitive eval.     HPI Other Pertinent Information: [name] is a 81 y.o. male with pmh of CAD,GERD, TIA, anxiety, depression,  DM, CKD, AS, he also has a right foot ulcer for the last few months for which he is getting outpatient wound care, Patient presents to the ED with c/o intermittent, ongoing, and unchanged SOB x2 weeks, possible heart failure with pulmonary edema, possible HCAP. No acute abnormalities noted on initial CXR. Also developed non-STEMI. Tried on Bipap initially however continued to worsen and was intubated 6/19 -7/2. Neurology consulted following decline due to noted jerking lingual movements however head CT negative.    Pertinent Vitals Pain Assessment: Faces Faces Pain Scale: No hurt  SLP Plan  Continue with current plan of care    Recommendations Diet recommendations: Dysphagia 2 (fine chop);Nectar-thick liquid Liquids provided via: Cup;Straw Medication Administration: Whole meds with puree Supervision: Staff to assist with self feeding;Full supervision/cueing for compensatory strategies Compensations: Slow rate;Small sips/bites;Multiple dry swallows after each bite/sip Postural Changes and/or Swallow Maneuvers: Seated upright 90  degrees;Upright 30-60 min after meal              Oral Care Recommendations: Oral care BID;Other (Comment) Plan: Continue with current plan of care    GO    [facility], MA CCC-SLP [phone]  Claudine Mouton 01/28/2015, 10:13 AM

## 2015-01-28 NOTE — Progress Notes (Signed)
Pt being discharged to [facility] via EMS transportation. Pt alert with confusion. VSS. Pt c/o no pain at this time. No signs of respiratory distress. Education complete and care plans resolved. Pt being discharged with Right PICC and foley catheter per MD orders. Will continue to receive Vancomycin and trial foley removal in few days. No further issues at this time. Jillyn Hidden, RN

## 2015-01-28 NOTE — Discharge Instructions (Signed)
Follow with Primary MD [name], MD in 7 days   Get CBC, CMP, 2 view Chest X ray checked  by Primary MD next visit.    Activity: As tolerated with Full fall precautions use walker/cane & assistance as needed   Disposition SNF     Diet: Dysphagia 2 diet with nectar thick liquids, with feeding assistance and aspiration precautions.  For Heart failure patients - Check your Weight same time everyday, if you gain over 2 pounds, or you develop in leg swelling, experience more shortness of breath or chest pain, call your Primary MD immediately. Follow Cardiac Low Salt Diet and 1.5 lit/day fluid restriction.   On your next visit with your primary care physician please Get Medicines reviewed and adjusted.   Please request your Prim.MD to go over all Hospital Tests and Procedure/Radiological results at the follow up, please get all Hospital records sent to your Prim MD by signing hospital release before you go home.   If you experience worsening of your admission symptoms, develop shortness of breath, life threatening emergency, suicidal or homicidal thoughts you must seek medical attention immediately by calling 911 or calling your MD immediately  if symptoms less severe.  You Must read complete instructions/literature along with all the possible adverse reactions/side effects for all the Medicines you take and that have been prescribed to you. Take any new Medicines after you have completely understood and accpet all the possible adverse reactions/side effects.   Do not drive, operating heavy machinery, perform activities at heights, swimming or participation in water activities or provide baby sitting services if your were admitted for syncope or siezures until you have seen by Primary MD or a Neurologist and advised to do so again.  Do not drive when taking Pain medications.    Do not take more than prescribed Pain, Sleep and Anxiety Medications  Special Instructions: If you have  smoked or chewed Tobacco  in the last 2 yrs please stop smoking, stop any regular Alcohol  and or any Recreational drug use.  Wear Seat belts while driving.   Please note  You were cared for by a hospitalist during your hospital stay. If you have any questions about your discharge medications or the care you received while you were in the hospital after you are discharged, you can call the unit and asked to speak with the hospitalist on call if the hospitalist that took care of you is not available. Once you are discharged, your primary care physician will handle any further medical issues. Please note that NO REFILLS for any discharge medications will be authorized once you are discharged, as it is imperative that you return to your primary care physician (or establish a relationship with a primary care physician if you do not have one) for your aftercare needs so that they can reassess your need for medications and monitor your lab values.

## 2015-01-28 NOTE — Progress Notes (Signed)
ANTIBIOTIC CONSULT NOTE - FOLLOW UP  Pharmacy Consult for Vancomycin  Indication: CoNS Bacteremia  Allergies  Allergen Reactions  . Statins Other (See Comments)    Severe leg myalgias, weakness    Patient Measurements: Height: 5\' 9"  (175.3 cm) Weight: 227 lb 15.3 oz (103.4 kg) IBW/kg (Calculated) : 70.7  Vital Signs: Temp: 98.6 F (37 C) (07/07 0536) Temp Source: Oral (07/07 0536) BP: 141/56 mmHg (07/07 0536) Pulse Rate: 95 (07/07 0536) Intake/Output from previous day: 07/06 0701 - 07/07 0700 In: 50 [P.O.:50] Out: 3125 [Urine:3125] Intake/Output from this shift: Total I/O In: 0  Out: 1825 [Urine:1825]  Labs:  Recent Labs  01/26/15 0640 01/27/15 0537  CREATININE 1.06 0.86   Estimated Creatinine Clearance: 82.6 mL/min (by C-G formula based on Cr of 0.86).  Recent Labs  01/28/15 0516  VANCOTROUGH 9*     Microbiology: Recent Results (from the past 720 hour(s))  MRSA PCR Screening     Status: None   Collection Time: 01/10/15  2:31 AM  Result Value Ref Range Status   MRSA by PCR NEGATIVE NEGATIVE Final    Comment:        The GeneXpert MRSA Assay (FDA approved for NASAL specimens only), is one component of a comprehensive MRSA colonization surveillance program. It is not intended to diagnose MRSA infection nor to guide or monitor treatment for MRSA infections.   MRSA PCR Screening     Status: None   Collection Time: 01/10/15  8:01 AM  Result Value Ref Range Status   MRSA by PCR NEGATIVE NEGATIVE Final    Comment:        The GeneXpert MRSA Assay (FDA approved for NASAL specimens only), is one component of a comprehensive MRSA colonization surveillance program. It is not intended to diagnose MRSA infection nor to guide or monitor treatment for MRSA infections.   Culture, respiratory (NON-Expectorated)     Status: None   Collection Time: 01/10/15  9:29 AM  Result Value Ref Range Status   Specimen Description TRACHEAL ASPIRATE  Final   Special  Requests NONE  Final   Gram Stain   Final    RARE WBC PRESENT, PREDOMINANTLY PMN RARE SQUAMOUS EPITHELIAL CELLS PRESENT FEW GRAM POSITIVE COCCI IN PAIRS FEW GRAM POSITIVE RODS Performed at [facility]    Culture   Final    Non-Pathogenic Oropharyngeal-type Flora Isolated. Performed at [facility]    Report Status 01/12/2015 FINAL  Final  Culture, Urine     Status: None   Collection Time: 01/10/15 10:21 AM  Result Value Ref Range Status   Specimen Description URINE, CATHETERIZED  Final   Special Requests NONE  Final   Culture 20,000 COLONIES/mL PSEUDOMONAS AERUGINOSA  Final   Report Status 01/12/2015 FINAL  Final   Organism ID, Bacteria PSEUDOMONAS AERUGINOSA  Final      Susceptibility   Pseudomonas aeruginosa - MIC*    CEFTAZIDIME <=1 SENSITIVE Sensitive     CIPROFLOXACIN <=0.25 SENSITIVE Sensitive     GENTAMICIN <=1 SENSITIVE Sensitive     IMIPENEM 2 SENSITIVE Sensitive     PIP/TAZO <=4 SENSITIVE Sensitive     CEFEPIME <=1 SENSITIVE Sensitive     * 20,000 COLONIES/mL PSEUDOMONAS AERUGINOSA  Culture, blood (routine x 2)     Status: None   Collection Time: 01/19/15  9:50 AM  Result Value Ref Range Status   Specimen Description BLOOD LEFT HAND  Final   Special Requests BOTTLES DRAWN AEROBIC AND ANAEROBIC  10 CC EACH  Final   Culture  Setup Time   Final    GRAM POSITIVE COCCI IN CLUSTERS IN PAIRS AEROBIC BOTTLE ONLY CRITICAL RESULT CALLED TO, READ BACK BY AND VERIFIED WITH: G HARDUK,RN 6/29 0539 RHOLMES CONFIRMED BY R GREEN    Culture STAPHYLOCOCCUS SPECIES (COAGULASE NEGATIVE)  Final   Report Status 01/24/2015 FINAL  Final   Organism ID, Bacteria STAPHYLOCOCCUS SPECIES (COAGULASE NEGATIVE)  Final      Susceptibility   Staphylococcus species (coagulase negative) - MIC*    CIPROFLOXACIN 4 RESISTANT Resistant     GENTAMICIN <=0.5 SENSITIVE Sensitive     OXACILLIN >=4 RESISTANT Resistant     VANCOMYCIN 2 SENSITIVE Sensitive     TRIMETH/SULFA 160 RESISTANT  Resistant     CLINDAMYCIN <=0.25 SENSITIVE Sensitive     RIFAMPIN <=0.5 SENSITIVE Sensitive     Inducible Clindamycin NEGATIVE Sensitive     * STAPHYLOCOCCUS SPECIES (COAGULASE NEGATIVE)  Culture, respiratory (NON-Expectorated)     Status: None   Collection Time: 01/19/15 10:15 AM  Result Value Ref Range Status   Specimen Description TRACHEAL ASPIRATE  Final   Special Requests NONE  Final   Gram Stain   Final    MODERATE WBC PRESENT,BOTH PMN AND MONONUCLEAR NO SQUAMOUS EPITHELIAL CELLS SEEN NO ORGANISMS SEEN Performed at [facility]    Culture   Final    Non-Pathogenic Oropharyngeal-type Flora Isolated. Performed at [facility]    Report Status 01/21/2015 FINAL  Final  Urine culture     Status: None   Collection Time: 01/19/15 10:21 AM  Result Value Ref Range Status   Specimen Description URINE, CATHETERIZED  Final   Special Requests NONE  Final   Culture NO GROWTH 1 DAY  Final   Report Status 01/20/2015 FINAL  Final  Culture, blood (routine x 2)     Status: None   Collection Time: 01/19/15 10:24 AM  Result Value Ref Range Status   Specimen Description BLOOD RIGHT HAND  Final   Special Requests BOTTLES DRAWN AEROBIC AND ANAEROBIC 10 CC EACH  Final   Culture  Setup Time   Final    GRAM POSITIVE COCCI IN CLUSTERS IN PAIRS AEROBIC BOTTLE ONLY CRITICAL RESULT CALLED TO, READ BACK BY AND VERIFIED WITH: G HARDUK,RN 01/20/15 0539 RHOLMES CONFIRMED BY R GREEN    Culture   Final    STAPHYLOCOCCUS SPECIES (COAGULASE NEGATIVE) SUSCEPTIBILITIES PERFORMED ON PREVIOUS CULTURE WITHIN THE LAST 5 DAYS.    Report Status 01/24/2015 FINAL  Final  Clostridium Difficile by PCR (not at [facility])     Status: None   Collection Time: 01/21/15 12:10 PM  Result Value Ref Range Status   C difficile by pcr NEGATIVE NEGATIVE Final  Culture, blood (routine x 2)     Status: None (Preliminary result)   Collection Time: 01/24/15  3:00 PM  Result Value Ref Range Status   Specimen  Description BLOOD LEFT ARM  Final   Special Requests BOTTLES DRAWN AEROBIC AND ANAEROBIC 10CC  Final   Culture NO GROWTH 3 DAYS  Final   Report Status PENDING  Incomplete    Anti-infectives    Start     Dose/Rate Route Frequency Ordered Stop   01/25/15 0600  vancomycin (VANCOCIN) 1,500 mg in sodium chloride 0.9 % 500 mL IVPB     1,500 mg 250 mL/hr over 120 Minutes Intravenous Every 24 hours 01/24/15 1132     01/22/15 0600  vancomycin (VANCOCIN) 1,250 mg in sodium chloride 0.9 % 250 mL  IVPB  Status:  Discontinued     1,250 mg 166.7 mL/hr over 90 Minutes Intravenous Every 48 hours 01/20/15 0558 01/20/15 1131   01/22/15 0600  vancomycin (VANCOCIN) 1,500 mg in sodium chloride 0.9 % 500 mL IVPB  Status:  Discontinued     1,500 mg 250 mL/hr over 120 Minutes Intravenous Every 48 hours 01/20/15 1131 01/24/15 1132   01/20/15 1300  piperacillin-tazobactam (ZOSYN) IVPB 3.375 g  Status:  Discontinued     3.375 g 12.5 mL/hr over 240 Minutes Intravenous Every 8 hours 01/20/15 1259 01/22/15 1542   01/20/15 0630  vancomycin (VANCOCIN) 1,500 mg in sodium chloride 0.9 % 500 mL IVPB     1,500 mg 250 mL/hr over 120 Minutes Intravenous  Once 01/20/15 0558 01/20/15 0828   01/13/15 1045  cefTRIAXone (ROCEPHIN) 1 g in dextrose 5 % 50 mL IVPB - Premix     1 g 100 mL/hr over 30 Minutes Intravenous Every 24 hours 01/13/15 1038 01/16/15 1126   01/11/15 1000  vancomycin (VANCOCIN) 1,250 mg in sodium chloride 0.9 % 250 mL IVPB  Status:  Discontinued     1,250 mg 166.7 mL/hr over 90 Minutes Intravenous Every 24 hours 01/10/15 0940 01/13/15 1038   01/10/15 1600  piperacillin-tazobactam (ZOSYN) IVPB 3.375 g  Status:  Discontinued     3.375 g 12.5 mL/hr over 240 Minutes Intravenous Every 8 hours 01/10/15 0940 01/13/15 1038   01/10/15 1000  vancomycin (VANCOCIN) 2,000 mg in sodium chloride 0.9 % 500 mL IVPB     2,000 mg 250 mL/hr over 120 Minutes Intravenous  Once 01/10/15 0927 01/10/15 1218   01/10/15 0930   piperacillin-tazobactam (ZOSYN) IVPB 3.375 g     3.375 g 100 mL/hr over 30 Minutes Intravenous  Once 01/10/15 0927 01/10/15 1048      Assessment: Sub-therapeutic vancomycin trough (drawn correctly)  Goal of Therapy:  Vancomycin trough level 15-20 mcg/ml  Plan:  -Change vancomycin to 1250 mg IV q12h -Re-check trough at steady state  Abran Duke 01/28/2015,6:24 AM

## 2015-01-28 NOTE — Progress Notes (Signed)
Subjective:  Patient is sitting up at the bedside today and is much more alert. He is still very hoarse. No real complaints of shortness of breath. The patient continues to be calm and alert and has a tray at the bedside. Renal function is now normal and his hypernatremia has now resolved. No complaints of chest pain. The plan evidently is for him to go to a skilled nursing facility. Discussion again about his overall status.  Objective:  Vital Signs in the last 24 hours: BP 160/79 mmHg  Pulse 93  Temp(Src) 98.2 F (36.8 C) (Oral)  Resp 19  Ht 5\' 9"  (1.753 m)  Wt 103.4 kg (227 lb 15.3 oz)  BMI 33.65 kg/m2  SpO2 98%  Physical Exam: Pleasant obese male in no acute distress Lungs:  Clear  Cardiac:  Regular rhythm, normal S1 and S2, no S3, harsh 3/6 systolic murmur aortic area and LSB Extremities:  No edema present, bandage over left lower extremity, excoriations noted on left lower shin.  Large tophaceous nodule on right knee  Intake/Output from previous day: 07/06 0701 - 07/07 0700 In: 50 [P.O.:50] Out: 3125 [Urine:3125] Weight Filed Weights   01/26/15 0416 01/26/15 2037 01/27/15 2038  Weight: 102.9 kg (226 lb 13.7 oz) 110 kg (242 lb 8.1 oz) 103.4 kg (227 lb 15.3 oz)    Lab Results: Basic Metabolic Panel:  Recent Labs  59/09/31 0640 01/27/15 0537  NA 146* 141  K 3.6 4.0  CL 111 109  CO2 28 24  GLUCOSE 127* 154*  BUN 25* 16  CREATININE 1.06 0.86    CBC: No results for input(s): WBC, NEUTROABS, HGB, HCT, MCV, PLT in the last 72 hours.  BNP    Component Value Date/Time   BNP 285.0* 01/10/2015 0919   Telemetry: Sinus rhythm  Assessment/Plan:  1. Recent acute respiratory failure with severe physical deconditioning 2. Moderate to severe aortic stenosis 3. Non-STEMI with elevation of troponin 4. History of chronic diastolic heart failure with recent acute worsening 5. History of renal failure resolved likely due to over diuresis  Recommendations:  Defer  consideration of TAVR and catheterization until he has completed his course of antibiotics which will be completed by July 28. Need to follow weights carefully and restart furosemide fairly soon to prevent recurrence of congestive heart failure.   Darden Palmer  MD [facility]  01/28/2015, 10:14 AM

## 2015-01-29 ENCOUNTER — Telehealth: Payer: Self-pay

## 2015-01-29 LAB — CULTURE, BLOOD (ROUTINE X 2): Culture: NO GROWTH

## 2015-01-29 NOTE — Telephone Encounter (Signed)
Pt is on TCM list. Admission for SOB, dysnea, myocardial infarct, abdominal distension, altered mental status.  DC instructions indicate SNF but also states follow up with PCP in one week.  Called number listed for pt. Not able to leave a message at this time. Will try again later.

## 2015-02-03 ENCOUNTER — Telehealth: Payer: Self-pay | Admitting: Internal Medicine

## 2015-02-03 NOTE — Telephone Encounter (Signed)
Rec'd from Dr.Spencer Tilley forward 4 pages to Dr. Leschber °

## 2015-02-04 ENCOUNTER — Inpatient Hospital Stay: Payer: Medicare Other | Admitting: Family

## 2015-02-05 ENCOUNTER — Telehealth: Payer: Self-pay | Admitting: Internal Medicine

## 2015-02-05 NOTE — Telephone Encounter (Signed)
I think that this pt was dc'ed from hospital to SNF. This appt should have been canceled.

## 2015-02-05 NOTE — Telephone Encounter (Signed)
Patient no showed for hospital fu on 7/14 with Greg.  Please advise.

## 2015-02-08 ENCOUNTER — Encounter: Payer: Self-pay | Admitting: Gastroenterology

## 2015-02-08 NOTE — Telephone Encounter (Signed)
No show has already processed through the system.  Is there any way we can head this fee off before it goes to mail?

## 2015-02-19 ENCOUNTER — Inpatient Hospital Stay: Payer: Medicare Other | Admitting: Internal Medicine

## 2015-02-22 ENCOUNTER — Telehealth: Payer: Self-pay | Admitting: Internal Medicine

## 2015-02-22 ENCOUNTER — Telehealth: Payer: Self-pay | Admitting: Pulmonary Disease

## 2015-02-22 NOTE — Telephone Encounter (Signed)
recvd message that pt is out of O2 for tonight. lvm for pt to call in order to get the necessary O2 pt needs.   Shanda Bumps with [facility] called and requested verbal orders for [facility] to take over [facility]. Verbal okay given. They will be out at the end of this week for evaluation and recommended HH interval and length.

## 2015-02-22 NOTE — Telephone Encounter (Signed)
Spoke with Stefannie in primary care. States that pt needs nocturnal oxygen STAT. He was discharged from [facility] today without any. We do not have any documentation as to why the pt needs oxygen nocturnally. Stefannie called [facility] and they advised that he did use oxygen at night but his level fell below 90% with or without the oxygen. Without documentation of desaturation or qualifying sats we can't send in an order for oxygen. I have spoke with the pt and Stefannie about this. The pt has been scheduled to see VS on 02/26/15 at 4pm. I have advised the pt that if he notices his oxygen desating or he becomes short of breath he needs to call 911. He agreed and verbalized understanding.

## 2015-02-22 NOTE — Telephone Encounter (Signed)
Routed to PCP for advisement on wound care.  Will contact pulmonology for the Oxygen Rx. Will contact Ther[facility] Leyland with PCP advisement and nursing orders as directed.

## 2015-02-22 NOTE — Telephone Encounter (Signed)
Spoke to [employer] Triage STAT note sent in an effort to get pt O2 before tonight.

## 2015-02-22 NOTE — Telephone Encounter (Signed)
[name] at [facility] called and is requesting: Nursing 2 x 3 wks 1 x 3wks  Needs oxygen at night and she needs a prescription.  He has some wounds and they are scabbed over. Does it still need dressing on it She can be reached at [phone]

## 2015-02-22 NOTE — Telephone Encounter (Signed)
Spoke to [place] in pulmonary. No documentation is found to indicate that O2 is needed. [name] is calling the pt and I am contacting [facility] for additional documentation to support need for O2.   After speaking to [facility], pt did wear O2 to sleep but pt did not have any O2 sat below 90 w/wo the O2.

## 2015-02-23 NOTE — Telephone Encounter (Signed)
Course of events noted thanks

## 2015-02-23 NOTE — Telephone Encounter (Signed)
Pt dc'ed from SNF on 02/22/2015. Appt with a PCP provider on 02/24/15 and Pulmonary on 02/26/15.

## 2015-02-24 ENCOUNTER — Encounter: Payer: Self-pay | Admitting: Internal Medicine

## 2015-02-24 ENCOUNTER — Ambulatory Visit (INDEPENDENT_AMBULATORY_CARE_PROVIDER_SITE_OTHER): Payer: Medicare Other | Admitting: Internal Medicine

## 2015-02-24 VITALS — BP 130/70 | HR 99 | Temp 97.8°F | Resp 93 | Ht 69.0 in | Wt 233.0 lb

## 2015-02-24 DIAGNOSIS — I2510 Atherosclerotic heart disease of native coronary artery without angina pectoris: Secondary | ICD-10-CM | POA: Diagnosis not present

## 2015-02-24 DIAGNOSIS — I5032 Chronic diastolic (congestive) heart failure: Secondary | ICD-10-CM | POA: Diagnosis not present

## 2015-02-24 DIAGNOSIS — I509 Heart failure, unspecified: Secondary | ICD-10-CM

## 2015-02-24 DIAGNOSIS — I6523 Occlusion and stenosis of bilateral carotid arteries: Secondary | ICD-10-CM

## 2015-02-24 DIAGNOSIS — I2583 Coronary atherosclerosis due to lipid rich plaque: Secondary | ICD-10-CM

## 2015-02-24 DIAGNOSIS — R7881 Bacteremia: Secondary | ICD-10-CM | POA: Diagnosis not present

## 2015-02-24 DIAGNOSIS — I110 Hypertensive heart disease with heart failure: Secondary | ICD-10-CM

## 2015-02-24 DIAGNOSIS — B957 Other staphylococcus as the cause of diseases classified elsewhere: Secondary | ICD-10-CM

## 2015-02-24 NOTE — Assessment & Plan Note (Signed)
No CP, had recent NSTEMI, known AS, to f/u card as planned

## 2015-02-24 NOTE — Assessment & Plan Note (Signed)
Volume stable, cont all same tx,  to f/u any worsening symptoms or concerns

## 2015-02-24 NOTE — Progress Notes (Signed)
Pre visit review using our clinic review tool, if applicable. No additional management support is needed unless otherwise documented below in the visit note. 

## 2015-02-24 NOTE — Progress Notes (Signed)
Subjective:    Patient ID: [name], male    DOB: [birth year], 81 y.o.   MRN: [id number]  HPI  Here to f/u post hospn with acute resp failure/chf/hcap/ams/bactermia/NSTEMI intubated x 14 days, d/c 7/7 to SNF x 23 days, now home for last several days.  Overall doing much better, walking with walker, good stamina, Mental status at baseline, afeb, VSS, no recent falls, no new complaints.  Has card visit to further evaluate known AS and possible left heart cath. Pt denies new neurological symptoms such as new headache, or facial or extremity weakness or numbness   Pt denies polydipsia, polyuria, Most recent a1c's have been normal, pt not taking his insulin and not checking sugars,  Has been diet controlle PTA, and he is not wanting other tx at this time.  For fu with ID as well Past Medical History  Diagnosis Date  . Gout, unspecified     severe dz, "treatment done that last 15 years"  . Obesity (BMI 30-39.9)   . Restless leg syndrome   . History of retinal detachment 1994  . Hypertensive heart disease   . CAD (coronary artery disease), native coronary artery   . History of pericarditis 2007    MSSA, s/p pericardial window  . GERD   . Osteoarthritis   . Sleep apnea in adult     CPAP qhs  . TIA (transient ischemic attack) 2005  . BPH (benign prostatic hypertrophy)     "minor"  . Lumbar spinal stenosis   . Carotid artery occlusion   . Unspecified venous (peripheral) insufficiency   . Stroke May 2003  . Anxiety   . Depression   . Aortic stenosis     ECHO 05/29/14 moderate AS Mean gradient 21 mm EF 55% ECHO 07/09/13  Mean gradient 12 mm Mild to moderate AS EF 50%    . CAD (coronary artery disease)     02/11/09  Xience stent to circumflex 2.5 x 18 mm postdilated to 2.75 mm  Cath sept 2011  normal Left main, 20 % stenosis proximal LAD, 40% stenosis mid LAD, 70% stenosis proximal Diag 1, 90% stenosis prox CFX, 90% first OM, small and nondominant RCA;   . Carotid artery disease     Prior  TIA 2006 with left CEA by Dr. Arbie Cookey Recurrent TIA in April 2011   . Type 2 diabetes mellitus with vascular disease   . Lumbar disc disease   . CKD (chronic kidney disease), stage III 02/08/2014  . CHF (congestive heart failure)    Past Surgical History  Procedure Laterality Date  . Pericardial window  2007  . Left arm  2008    shoulder  . Right arm  1970's    shoulder  . Cataract extraction      Left side x's 2 and right  . Retinal detachment surgery      left side  . Carotid endarterectomy Left 12-06-05    cea  . Coronary angioplasty  01/2009    x 1 stent  . Colonoscopy    . Carpal tunnel release Bilateral   . Back surgery  2013    removed bone spurs  . Inguinal hernia repair Left 10/01/2013    Procedure: HERNIA REPAIR INGUINAL ADULT;  Surgeon: Axel Filler, MD;  Location: [facility];  Service: General;  Laterality: Left;  . Insertion of mesh Left 10/01/2013    Procedure: INSERTION OF MESH;  Surgeon: Axel Filler, MD;  Location: [facility];  Service: General;  Laterality:  Left;  . Finger amputation  May 2015    JUST TO FIRST JOINT RIGHT HAND  LAST FINGER ( Right 5th finger)  . Peripheral vascular catheterization N/A 01/08/2015    Procedure: Abdominal Aortogram;  Surgeon: Chuck Hint, MD;  Location: [facility] INVASIVE CV LAB;  Service: Cardiovascular;  Laterality: N/A;    reports that he has never smoked. He has never used smokeless tobacco. He reports that he does not drink alcohol or use illicit drugs. family history includes Breast cancer in his sister; Cancer in his brother and sister; Diabetes in his daughter, father, and son; Heart attack in his mother; Heart disease in his father and mother; Hypertension in his son; Lung cancer in his brother; Prostate cancer in his brother. Allergies  Allergen Reactions  . Statins Other (See Comments)    Severe leg myalgias, weakness   Current Outpatient Prescriptions on File Prior to Visit  Medication Sig Dispense Refill  .  acetaminophen (TYLENOL) 325 MG tablet Take 2 tablets (650 mg total) by mouth every 6 (six) hours as needed for mild pain or moderate pain. (Patient taking differently: Take 325 mg by mouth every 6 (six) hours as needed for mild pain or moderate pain. )    . allopurinol (ZYLOPRIM) 300 MG tablet Take 1 tablet (300 mg total) by mouth every morning. 90 tablet 1  . aspirin EC 81 MG tablet Take 81 mg by mouth daily.    Marland Kitchen buPROPion (WELLBUTRIN XL) 150 MG 24 hr tablet Take 1 tablet (150 mg total) by mouth every morning. 90 tablet 1  . cholecalciferol (VITAMIN D) 1000 UNITS tablet Take 1,000 Units by mouth daily.    . clopidogrel (PLAVIX) 75 MG tablet Take 1 tablet (75 mg total) by mouth daily. 90 tablet 3  . docusate sodium 100 MG CAPS Take 100 mg by mouth 2 (two) times daily. 10 capsule 0  . Ferrous Gluconate 256 (28 FE) MG TABS Take 256 mg by mouth daily.    . furosemide (LASIX) 40 MG tablet Take 1 tablet (40 mg total) by mouth daily. (Patient taking differently: Take 40 mg by mouth 2 (two) times daily. )    . Magnesium 250 MG TABS Take 250 mg by mouth daily.     . metoprolol succinate (TOPROL-XL) 50 MG 24 hr tablet Take 1 tablet (50 mg total) by mouth daily. Take with or immediately following a meal. 90 tablet 3  . mirabegron ER (MYRBETRIQ) 50 MG TB24 tablet Take 1 tablet (50 mg total) by mouth daily. 90 tablet 1  . mirtazapine (REMERON) 15 MG tablet Take 1 tablet (15 mg total) by mouth at bedtime. 30 tablet 11  . pantoprazole (PROTONIX) 40 MG tablet Take 1 tablet (40 mg total) by mouth 2 (two) times daily. 90 tablet 1  . pregabalin (LYRICA) 200 MG capsule Take 1 capsule (200 mg total) by mouth at bedtime. 90 capsule 1  . rOPINIRole (REQUIP) 4 MG tablet Take 1 tablet (4 mg total) by mouth at bedtime. 90 tablet 1  . sodium chloride (OCEAN) 0.65 % SOLN nasal spray Place 1 spray into both nostrils as needed for congestion.    . tamsulosin (FLOMAX) 0.4 MG CAPS capsule Take 1 capsule (0.4 mg total) by mouth  daily. 90 capsule 3  . Amino Acids-Protein Hydrolys (FEEDING SUPPLEMENT, PRO-STAT SUGAR FREE 64,) LIQD Place 60 mLs into feeding tube 2 (two) times daily. (Patient not taking: Reported on 02/24/2015) 900 mL 0  . vancomycin 1,250 mg in sodium  chloride 0.9 % 250 mL Inject 1,250 mg into the vein every 12 (twelve) hours. Via PICC Line, dose to be monitored by SNF pharmacy. Draw peak and trough levels twice a week and forwarded to the ID physician Dr. Moshe Cipro office. Stop date is 02/20/2015. (Patient not taking: Reported on 02/24/2015)     No current facility-administered medications on file prior to visit.      Review of Systems  Constitutional: Negative for unusual diaphoresis or night sweats HENT: Negative for ringing in ear or discharge Eyes: Negative for double vision or worsening visual disturbance.  Respiratory: Negative for choking and stridor.   Gastrointestinal: Negative for vomiting or other signifcant bowel change Genitourinary: Negative for hematuria or change in urine volume.  Musculoskeletal: Negative for other MSK pain or swelling Skin: Negative for color change and worsening wound.  Neurological: Negative for tremors and numbness other than noted  Psychiatric/Behavioral: Negative for decreased concentration or agitation other than above  '     Objective:   Physical Exam BP 130/70 mmHg  Pulse 99  Temp(Src) 97.8 F (36.6 C) (Oral)  Resp 93  Ht 5\' 9"  (1.753 m)  Wt 233 lb (105.688 kg)  BMI 34.39 kg/m2  SpO2 94% VS noted,  Constitutional: Pt appears in no significant distress HENT: Head: NCAT.  Right Ear: External ear normal.  Left Ear: External ear normal.  Eyes: . Pupils are equal, round, and reactive to light. Conjunctivae and EOM are normal Neck: Normal range of motion. Neck supple.  Cardiovascular: Normal rate and regular rhythm.   Pulmonary/Chest: Effort normal and breath sounds without rales or wheezing.  Abd:  Soft, NT, ND, + BS Neurological: Pt is alert. Not  confused , motor grossly intact Skin: Skin is warm. No rash, no LE edema Psychiatric: Pt behavior is normal. No agitation.     Assessment & Plan:

## 2015-02-24 NOTE — Assessment & Plan Note (Signed)
Afeb, to f/u ID as planned

## 2015-02-24 NOTE — Assessment & Plan Note (Signed)
stable overall by history and exam, recent data reviewed with pt, and pt to continue medical treatment as before,  to f/u any worsening symptoms or concerns BP Readings from Last 3 Encounters:  02/24/15 130/70  01/28/15 123/58  01/08/15 134/55

## 2015-02-24 NOTE — Patient Instructions (Signed)
Please continue all other medications as before, and refills have been done if requested.  Please have the pharmacy call with any other refills you may need.  Please continue your efforts at being more active, low cholesterol diet, and weight control.  Please keep your appointments with your specialists as you may have planned  Please return in 3 months, or sooner if needed 

## 2015-02-25 ENCOUNTER — Telehealth: Payer: Self-pay | Admitting: Internal Medicine

## 2015-02-25 NOTE — Telephone Encounter (Signed)
LVM for verbal okay for the requested nursing, education and PT/OT evaluation as stated below.

## 2015-02-25 NOTE — Telephone Encounter (Signed)
[name] from [place] [phone], requesting verbal orders for: Nursing 1 x 1 wk, 3x wk 2wks , 2x 2wks & 1 x 4wks.  Education cardio & pulmonary Evaluation for PT & OT

## 2015-02-26 ENCOUNTER — Ambulatory Visit (INDEPENDENT_AMBULATORY_CARE_PROVIDER_SITE_OTHER): Payer: Medicare Other | Admitting: Pulmonary Disease

## 2015-02-26 ENCOUNTER — Encounter: Payer: Self-pay | Admitting: Cardiology

## 2015-02-26 ENCOUNTER — Encounter: Payer: Self-pay | Admitting: Pulmonary Disease

## 2015-02-26 VITALS — BP 110/58 | HR 92 | Ht 70.0 in | Wt 233.0 lb

## 2015-02-26 DIAGNOSIS — G4733 Obstructive sleep apnea (adult) (pediatric): Secondary | ICD-10-CM

## 2015-02-26 DIAGNOSIS — E662 Morbid (severe) obesity with alveolar hypoventilation: Secondary | ICD-10-CM | POA: Diagnosis not present

## 2015-02-26 DIAGNOSIS — I6523 Occlusion and stenosis of bilateral carotid arteries: Secondary | ICD-10-CM

## 2015-02-26 NOTE — Progress Notes (Signed)
Patient ID: [name], male   DOB: 11/16/1932, 81 y.o.   MRN: [id number]  Mason, Lopezperez [name]  Date of visit:  02/26/2015 DOB:  10/01/1932    Age:  81 yrs. Medical record number:  [id number]     Account number:  [id number] Primary Care Provider: [name] ANN ____________________________ CURRENT DIAGNOSES  1. Dyspnea  2. Presence of coronary angioplasty implant and graft  3. CAD Native without angina  4. Aortic valve stenosis  5. Chronic diastolic heart failure  6. Type 2 diabetes mellitus with diabetic nephropathy  7. Chronic kidney disease, stage 3 (moderate)  8. Sleep apnea  9. Hypertensive heart disease without heart failure  10. Obesity  11. Hyperlipidemia  12. Idiopathic gout, multiple sites  13. Personal history of transient ischemic attack (TIA), and cerebral infarction without residual deficits  14. Restless legs syndrome  15. Occlusion and stenosis of right carotid artery ____________________________ ALLERGIES  Atorvastatin, Muscle aches  Febuxostat, Rash  Simvastatin, Muscle aches ____________________________ MEDICATIONS  1. Requip 4 mg Tablet, 1 p.o. daily  2. Vitamin D 1,000 unit Capsule, 1 p.o. daily  3. mirtazapine 15 mg Tablet, Rapid Dissolve, 1 p.o. daily  4. lorazepam 1 mg Tablet, 1 p.o. daily  5. allopurinol 300 mg tablet, 1 p.o. daily  6. ferrous gluconate 256 mg (28 mg iron) tablet, 1 p.o. daily  7. magnesium 250 mg tablet, 1 p.o. daily  8. clopidogrel 75 mg tablet, 1 p.o. daily  9. aspirin 81 mg chewable tablet, 1 p.o. daily  10. tamsulosin ER 0.4 mg capsule,extended release 24 hr, 1 p.o. daily  11. ipratropium-albuterol 0.5 mg-3 mg(2.5 mg base)/3 mL nebulization soln, PRN  12. Myrbetriq 50 mg tablet,extended release, 1 p.o. daily  13. Colace 100 mg capsule, BID  14. Lyrica 200 mg capsule, 1 p.o. daily  15. pantoprazole 40 mg tablet,delayed release, 1 p.o. daily  16. Wellbutrin XL 150 mg 24 hr tablet, extended release, 1 p.o. daily  17.  metoprolol succinate ER 50 mg tablet,extended release 24 hr, 1 p.o. daily  18. Percocet 5 mg-325 mg tablet, PRN  19. furosemide 40 mg tablet, BID ____________________________ CHIEF COMPLAINTS  Followup of Aortic valve stenosis and prior bacteremia  Followup of CAD Native without angina ____________________________ HISTORY OF PRESENT ILLNESS Patient seen for cardiac followup. He has gotten out of the nursing home now and also has had his catheter removed. He has some mild incontinence as well as nocturia and urgency but for the most part is getting along fairly well. He has completed his course of antibiotics but has not seen the infectious disease doctor. He did have a staph bacteremia in the hospital but was not felt to be too high risk for a TEE and city was treated empirically with 4 weeks of antibiotics. He is clinically doing a lot better and has less dyspnea. His legs are noninfected but he has not gone back to see the wound clinic. He has not had any recurrence of angina. While he was recently hospitalized and had a nondistended with elevation of his troponins and also had significant heart failure and was diuresed. He had renal failure in the hospital and hopefully is getting better now. He is anxious about whether he would be a candidate for percutaneous valve replacement and a catheterization today. He is still weak and complains of some shakiness. No chest pain suggestive of angina. ____________________________ PAST HISTORY  Past Medical Illnesses:  hypertension, hyperlipidemia, obesity, gout, TIA, restless legs, BPH, right brain CVA, osteoarthritis, sleep  apnea;  Cardiovascular Illnesses:  CAD, staph pericarditis 11/07 with window, history of  carotid atery disease;  Surgical Procedures:  cataract extraction OU, detached retina, carpal tunnel release, shoulder repair-rt, nasal surg, carotid endarterectomy-left;  NYHA Classification:  II;  Canadian Angina Classification:  Class 0:  Asymptomatic;  Cardiology Procedures-Invasive:  pericardial window 06/2006, cardiac cath (left) July 2010, Xience DES stent  circumflex  July 2010  Dr. Excell Seltzer, cardiac cath (left) September 2011;  Cardiology Procedures-Noninvasive:  regadenoson thallium July 2010, echocardiogram May 2013, echocardiogram January 2014, echocardiogram December 2014, echocardiogram June 2016;  Cardiac Cath Results:  normal Left main, 20 % stenosis proximal LAD, 40% stenosis mid LAD, 70% stenosis proximal Diag 1, 90% stenosis prox CFX, 90% first OM, small and nondominant RCA;  LVEF of 55% documented via echocardiogram on 05/29/2014,   ____________________________ CARDIO-PULMONARY TEST DATES EKG Date:  08/26/2013;   Cardiac Cath Date:  04/15/2010;  Stent Placement Date: 02/11/2009;  Nuclear Study Date:  02/04/2009;  Echocardiography Date: 01/04/2015;  Chest Xray Date: 07/17/2014;   ____________________________ FAMILY HISTORY Brother -- Brother dead, Carcinoma of the pancreas Father -- Father dead, Coronary Artery Disease, Deceased Mother -- Mother dead, Coronary Artery Disease Sister -- Sister alive and well Sister -- Sister dead, Chronic obstructive lung disease Sister -- Sister dead, Cancer Sister -- Sister alive with problem, Dementia/Alzheimer's ____________________________ SOCIAL HISTORY Alcohol Use:  no alcohol use;  Smoking:  never smoked;  Diet:  regular diet;  Lifestyle:  widower;  Exercise:  exercise is limited due to physical disability;  Occupation:  retired and [employer];  Residence:  lives with daughter;   ____________________________ REVIEW OF SYSTEMS General:  obesity, malaise and fatigue  Integumentary:easy bruisability Eyes: history of retinal detachment, cataract extraction O.U. Respiratory: dyspnea with exertion Cardiovascular:  please review HPI Abdominal: denies dyspepsia, GI bleeding, constipation, or diarrhea Genitourinary-Male: nocturia, mild incontinence  Musculoskeletal:  history  of cellulitis Neurological:  left arm weakness  ____________________________ PHYSICAL EXAMINATION VITAL SIGNS  Blood Pressure:  124/70 Sitting, Left arm, regular cuff  , 120/70 Standing, Left arm and regular cuff   Pulse:  76/min. Weight:  235.00 lbs. Height:  70"BMI: 33  Constitutional:  pleasant white male in no acute distress walks with walker Skin:  scattered hemangiomas, multiple seborrhic keratosis Head:  normocephalic, normal hair pattern, no masses or tenderness ENT:  rosacea, several missing teeth Neck:  supple, no masses, thyromegaly,  Carotid pulses are full and equal bilaterally without bruits. JVD difficult to assess, healed left carotid endarterectomy scar Chest:  normal symmetry, clear to auscultation. Cardiac:  regular rhythm, normal S1 and S2, no S3 or S4, grade 3/6 harsh systolic murmur left sternal border Abdomen:  abdomen soft,non-tender, no masses, no hepatospenomegaly, or aneurysm noted Peripheral Pulses:  pulses full and equal in all extremities Neurological:  weakness left hand ____________________________ MOST RECENT LIPID PANEL 07/15/14  CHOL TOTL 190 mg/dl, LDL 960 NM, HDL 32 mg/dl, TRIGLYCER 454 mg/dl and CHOL/HDL 5.9 (Calc) ____________________________ IMPRESSIONS/PLAN  1. Acute on chronic diastolic heart failure clinically better 2. Aortic stenosis severe with multiple recent admissions with respiratory failure 3. Recent staph bacteremia the patient is antibiotics 4. Previous non-STEMI 5. Prior coronary artery disease with circumflex stenting 6. Prior history of staph pericarditis  Recommendations:  He is clinically a lot better and is slowly improving. He asks about whether he would be a candidate for a valve intervention. I think he is a poor candidate for surgery for multiple reasons. I would like for him  to have a consultation with Dr. Excell Seltzer to evaluate him for whether he would be a candidate for TAVR. He has had multiple admissions this year for  respiratory failure and we wondered about the contribution of aortic stenosis to it.  I will get repeat blood cultures today and renal function studies. Also would like for him to get back involved with the wound clinic. His legs looked a lot better today but need to stay on top of those. Followup with me in one month.  ____________________________ TODAYS ORDERS  1. Blood Culture: Today  2. Comprehensive Metabolic Panel: Today  3. Complete Blood Count: Today  4. Return Visit: 1 month                       ____________________________ Cardiology Physician:  Darden Palmer MD [facility]

## 2015-02-26 NOTE — Patient Instructions (Signed)
Will get copy of CPAP report Will arrange for overnight oxygen test while using CPAP Follow up in 6 months

## 2015-02-26 NOTE — Progress Notes (Signed)
Chief Complaint  Patient presents with  . Follow-up    Former [facility] patient; patient here for hospital follow up not Sleep Consult.  Patient says that he has been having trouble breathing since he got out of the hospital because of the tube he had in his throat for 14 days.  Was in hospital for 19 days, in nursing home 23 days.  Raspy in throat.     History of Present Illness: [name] is a 81 y.o. male with OSA and OHS.  He was previously followed by Dr. Shelle [name].  He was in hospital recently with bacteremia, aortic stenosis, and respiratory failure.  He has been using CPAP.  He is not using oxygen at night.  He has full face mask.  He uses [facility] for his DME.  He is followed by Dr. Donnie [name] with cardiology.  He sleeps okay, and feels okay during the day.  He feels weak and gets winded easily.  He denies chest pain, cough, or sputum.  TESTS: PSG 01/03/05 >> AHI 9.7, SaO2 low 81% PSG 09/06/10 >> AHI 41, SaO2 low 86% Echo 01/09/15 >> EF 55 to 60%, grade 2 diastolic dysfx, mod AS  Past medical hx >> Gout, RLS, HTN, CAD, AS, Pericarditis 2007, DM, TIA, BPH, Spinal stenosis, Anxiety, Depression, CKD  Past surgical hx, Medications, Allergies, Family hx, Social hx all reviewed.   Physical Exam: BP 110/58 mmHg  Pulse 92  Ht 5\' 10"  (1.778 m)  Wt 233 lb (105.688 kg)  BMI 33.43 kg/m2  SpO2 93%  General - No distress ENT - No sinus tenderness, no oral exudate, no LAN Cardiac - s1s2 regular, 3/6 SM Chest - No wheeze/rales/dullness Back - No focal tenderness Abd - Soft, non-tender Ext - No edema Neuro - Normal strength, mild tremor Skin - No rashes Psych - normal mood, and behavior   Assessment/Plan:  Obstructive sleep apnea. Plan: - will get copy of his CPAP report  Obesity hypoventilation syndrome. Plan: - will arrange for ONO with CPAP and room air    Mason Helling, MD [facility] Pager:  [phone]

## 2015-03-01 ENCOUNTER — Telehealth: Payer: Self-pay | Admitting: Internal Medicine

## 2015-03-01 NOTE — Telephone Encounter (Signed)
Verbal okay given.  

## 2015-03-01 NOTE — Telephone Encounter (Signed)
[name] is requesting verbals for PT for 2x/week for 4 weeks.

## 2015-03-03 ENCOUNTER — Telehealth: Payer: Self-pay

## 2015-03-03 NOTE — Telephone Encounter (Signed)
[name] with [facility] called. Informed us that wound opened back up. Pt [facility]17/[facility]. Verbal okay given to perform wound care until pt can see the [facility].

## 2015-03-04 ENCOUNTER — Ambulatory Visit (INDEPENDENT_AMBULATORY_CARE_PROVIDER_SITE_OTHER): Payer: Medicare Other | Admitting: Internal Medicine

## 2015-03-04 ENCOUNTER — Encounter: Payer: Self-pay | Admitting: Internal Medicine

## 2015-03-04 VITALS — BP 136/66 | HR 85 | Temp 97.5°F | Ht 69.0 in | Wt 237.0 lb

## 2015-03-04 DIAGNOSIS — I110 Hypertensive heart disease with heart failure: Secondary | ICD-10-CM

## 2015-03-04 DIAGNOSIS — L270 Generalized skin eruption due to drugs and medicaments taken internally: Secondary | ICD-10-CM | POA: Diagnosis not present

## 2015-03-04 DIAGNOSIS — T50995A Adverse effect of other drugs, medicaments and biological substances, initial encounter: Secondary | ICD-10-CM

## 2015-03-04 DIAGNOSIS — I6523 Occlusion and stenosis of bilateral carotid arteries: Secondary | ICD-10-CM | POA: Diagnosis not present

## 2015-03-04 DIAGNOSIS — E1151 Type 2 diabetes mellitus with diabetic peripheral angiopathy without gangrene: Secondary | ICD-10-CM

## 2015-03-04 DIAGNOSIS — I509 Heart failure, unspecified: Secondary | ICD-10-CM

## 2015-03-04 DIAGNOSIS — E1159 Type 2 diabetes mellitus with other circulatory complications: Secondary | ICD-10-CM

## 2015-03-04 MED ORDER — PREDNISONE 10 MG PO TABS: ORAL_TABLET | ORAL | Status: DC

## 2015-03-04 MED ORDER — METHYLPREDNISOLONE ACETATE 80 MG/ML IJ SUSP
80.0000 mg | Freq: Once | INTRAMUSCULAR | Status: AC
  Administered 2015-03-04: 80 mg via INTRAMUSCULAR

## 2015-03-04 NOTE — Assessment & Plan Note (Signed)
stable overall by history and exam, recent data reviewed with pt, and pt to continue medical treatment as before,  to f/u any worsening symptoms or concerns Lab Results  Component Value Date   HGBA1C 6.2* 07/15/2014   Pt to call for worsening blood sugars > 200, or polys onset

## 2015-03-04 NOTE — Progress Notes (Signed)
Pre visit review using our clinic review tool, if applicable. No additional management support is needed unless otherwise documented below in the visit note. 

## 2015-03-04 NOTE — Patient Instructions (Signed)
You had the steroid shot today (depomedrol)  Please take all new medication as prescribed - the prednisone  You can also take benadryl OTC 50 mg every 6 hrs for rash and itching  You can also take the zantac 150 mg twice per day until rash resolves as well  Please continue all other medications as before, and refills have been done if requested.  Please have the pharmacy call with any other refills you may need.  Please continue your efforts at being more active, low cholesterol diet, and weight control.  Please keep your appointments with your specialists as you may have planned

## 2015-03-04 NOTE — Assessment & Plan Note (Signed)
Mild to mod, etiology unclear, for depomedrol IM, predpac asd, benadryl prn, and has zantac at home to take as well,  to f/u any worsening symptoms or concerns

## 2015-03-04 NOTE — Assessment & Plan Note (Signed)
stable overall by history and exam, recent data reviewed with pt, and pt to continue medical treatment as before,  to f/u any worsening symptoms or concerns BP Readings from Last 3 Encounters:  03/04/15 136/66  02/26/15 110/58  02/24/15 130/70

## 2015-03-04 NOTE — Progress Notes (Signed)
Subjective:    Patient ID: [name], male    DOB: [birth year], 81 y.o.   MRN: [id number]  HPI  Here with 2-3 days onset diffuse rash to arms and lower abdomen with marked itching, couldn't sleep last night, some better with calamiine but nothing else seems to make better or worse. No other swelling, tongue or throat symtpoms and Pt denies chest pain, increased sob or doe, wheezing, orthopnea, PND, increased LE swelling, palpitations, dizziness or syncope.   Pt denies polydipsia, polyuria, or low sugar symptoms  No recent med changes, food changes or environment Past Medical History  Diagnosis Date  . Gout, unspecified     severe dz, "treatment done that last 15 years"  . Obesity (BMI 30-39.9)   . Restless leg syndrome   . History of retinal detachment 1994  . Hypertensive heart disease   . CAD (coronary artery disease), native coronary artery   . History of pericarditis 2007    MSSA, s/p pericardial window  . GERD   . Osteoarthritis   . Sleep apnea in adult     CPAP qhs  . TIA (transient ischemic attack) 2005  . BPH (benign prostatic hypertrophy)     "minor"  . Lumbar spinal stenosis   . Carotid artery occlusion   . Unspecified venous (peripheral) insufficiency   . Stroke May 2003  . Anxiety   . Depression   . Aortic stenosis     ECHO 05/29/14 moderate AS Mean gradient 21 mm EF 55% ECHO 07/09/13  Mean gradient 12 mm Mild to moderate AS EF 50%    . CAD (coronary artery disease)     02/11/09  Xience stent to circumflex 2.5 x 18 mm postdilated to 2.75 mm  Cath sept 2011  normal Left main, 20 % stenosis proximal LAD, 40% stenosis mid LAD, 70% stenosis proximal Diag 1, 90% stenosis prox CFX, 90% first OM, small and nondominant RCA;   . Carotid artery disease     Prior TIA 2006 with left CEA by Dr. Arbie Cookey Recurrent TIA in April 2011   . Type 2 diabetes mellitus with vascular disease   . Lumbar disc disease   . CKD (chronic kidney disease), stage III 02/08/2014  . CHF (congestive  heart failure)    Past Surgical History  Procedure Laterality Date  . Pericardial window  2007  . Left arm  2008    shoulder  . Right arm  1970's    shoulder  . Cataract extraction      Left side x's 2 and right  . Retinal detachment surgery      left side  . Carotid endarterectomy Left 12-06-05    cea  . Coronary angioplasty  01/2009    x 1 stent  . Colonoscopy    . Carpal tunnel release Bilateral   . Back surgery  2013    removed bone spurs  . Inguinal hernia repair Left 10/01/2013    Procedure: HERNIA REPAIR INGUINAL ADULT;  Surgeon: Axel Filler, MD;  Location: [facility];  Service: General;  Laterality: Left;  . Insertion of mesh Left 10/01/2013    Procedure: INSERTION OF MESH;  Surgeon: Axel Filler, MD;  Location: [facility];  Service: General;  Laterality: Left;  . Finger amputation  May 2015    JUST TO FIRST JOINT RIGHT HAND  LAST FINGER ( Right 5th finger)  . Peripheral vascular catheterization N/A 01/08/2015    Procedure: Abdominal Aortogram;  Surgeon: Chuck Hint, MD;  Location:  [facility];  Service: Cardiovascular;  Laterality: N/A;    reports that he has never smoked. He has never used smokeless tobacco. He reports that he does not drink alcohol or use illicit drugs. family history includes Breast cancer in his sister; Cancer in his brother and sister; Diabetes in his daughter, father, and son; Heart attack in his mother; Heart disease in his father and mother; Hypertension in his son; Lung cancer in his brother; Prostate cancer in his brother. Allergies  Allergen Reactions  . Statins Other (See Comments)    Severe leg myalgias, weakness   Current Outpatient Prescriptions on File Prior to Visit  Medication Sig Dispense Refill  . acetaminophen (TYLENOL) 325 MG tablet Take 2 tablets (650 mg total) by mouth every 6 (six) hours as needed for mild pain or moderate pain. (Patient taking differently: Take 325 mg by mouth every 6 (six) hours as needed for mild  pain or moderate pain. )    . allopurinol (ZYLOPRIM) 300 MG tablet Take 1 tablet (300 mg total) by mouth every morning. 90 tablet 1  . aspirin EC 81 MG tablet Take 81 mg by mouth daily.    Marland Kitchen buPROPion (WELLBUTRIN XL) 150 MG 24 hr tablet Take 1 tablet (150 mg total) by mouth every morning. 90 tablet 1  . cholecalciferol (VITAMIN D) 1000 UNITS tablet Take 1,000 Units by mouth daily.    . clopidogrel (PLAVIX) 75 MG tablet Take 1 tablet (75 mg total) by mouth daily. 90 tablet 3  . docusate sodium 100 MG CAPS Take 100 mg by mouth 2 (two) times daily. 10 capsule 0  . Ferrous Gluconate 256 (28 FE) MG TABS Take 256 mg by mouth daily.    . furosemide (LASIX) 40 MG tablet Take 1 tablet (40 mg total) by mouth daily. (Patient taking differently: Take 40 mg by mouth 2 (two) times daily. )    . Magnesium 250 MG TABS Take 250 mg by mouth daily.     . metoprolol succinate (TOPROL-XL) 50 MG 24 hr tablet Take 1 tablet (50 mg total) by mouth daily. Take with or immediately following a meal. 90 tablet 3  . mirabegron ER (MYRBETRIQ) 50 MG TB24 tablet Take 1 tablet (50 mg total) by mouth daily. 90 tablet 1  . mirtazapine (REMERON) 15 MG tablet Take 1 tablet (15 mg total) by mouth at bedtime. 30 tablet 11  . pantoprazole (PROTONIX) 40 MG tablet Take 1 tablet (40 mg total) by mouth 2 (two) times daily. 90 tablet 1  . pregabalin (LYRICA) 200 MG capsule Take 1 capsule (200 mg total) by mouth at bedtime. 90 capsule 1  . rOPINIRole (REQUIP) 4 MG tablet Take 1 tablet (4 mg total) by mouth at bedtime. 90 tablet 1  . sodium chloride (OCEAN) 0.65 % SOLN nasal spray Place 1 spray into both nostrils as needed for congestion.    . tamsulosin (FLOMAX) 0.4 MG CAPS capsule Take 1 capsule (0.4 mg total) by mouth daily. 90 capsule 3   No current facility-administered medications on file prior to visit.   Review of Systems  Constitutional: Negative for unusual diaphoresis or night sweats HENT: Negative for ringing in ear or  discharge Eyes: Negative for double vision or worsening visual disturbance.  Respiratory: Negative for choking and stridor.   Gastrointestinal: Negative for vomiting or other signifcant bowel change Genitourinary: Negative for hematuria or change in urine volume.  Musculoskeletal: Negative for other MSK pain or swelling Skin: Negative for color change  and worsening wound.  Neurological: Negative for tremors and numbness other than noted  Psychiatric/Behavioral: Negative for decreased concentration or agitation other than above       Objective:   Physical Exam BP 136/66 mmHg  Pulse 85  Temp(Src) 97.5 F (36.4 C) (Oral)  Ht 5\' 9"  (1.753 m)  Wt 237 lb (107.502 kg)  BMI 34.98 kg/m2  SpO2 94% VS noted,  Constitutional: Pt appears in no significant distress HENT: Head: NCAT.  Right Ear: External ear normal.  Left Ear: External ear normal.  Eyes: . Pupils are equal, round, and reactive to light. Conjunctivae and EOM are normal Neck: Normal range of motion. Neck supple.  Cardiovascular: Normal rate and regular rhythm.   Pulmonary/Chest: Effort normal and breath sounds without rales or wheezing.  Abd:  Soft, NT, ND, + BS Neurological: Pt is alert. Not confused , motor grossly intact Skin: Skin is warm. Diffuse hive like rash to extremities and beltline plaquelike, nontender rash, no LE edema Psychiatric: Pt behavior is normal. No agitation.     Assessment & Plan:

## 2015-03-04 NOTE — Addendum Note (Signed)
Addended by: Anselm Jungling on: 03/04/2015 10:24 AM   Modules accepted: Orders

## 2015-03-09 ENCOUNTER — Telehealth: Payer: Self-pay | Admitting: Internal Medicine

## 2015-03-09 NOTE — Telephone Encounter (Signed)
Verbal okay given.  

## 2015-03-09 NOTE — Telephone Encounter (Signed)
[name]   from [place] [phone]  need verbal for PRN visit  2 or 3 visits if possible ??

## 2015-03-12 ENCOUNTER — Other Ambulatory Visit: Payer: Self-pay | Admitting: *Deleted

## 2015-03-12 ENCOUNTER — Ambulatory Visit (INDEPENDENT_AMBULATORY_CARE_PROVIDER_SITE_OTHER): Payer: Medicare Other | Admitting: Cardiovascular Disease

## 2015-03-12 VITALS — BP 136/66 | HR 88 | Ht 70.0 in | Wt 231.4 lb

## 2015-03-12 DIAGNOSIS — I6523 Occlusion and stenosis of bilateral carotid arteries: Secondary | ICD-10-CM

## 2015-03-12 DIAGNOSIS — I2510 Atherosclerotic heart disease of native coronary artery without angina pectoris: Secondary | ICD-10-CM

## 2015-03-12 DIAGNOSIS — I5030 Unspecified diastolic (congestive) heart failure: Secondary | ICD-10-CM

## 2015-03-12 DIAGNOSIS — I2583 Coronary atherosclerosis due to lipid rich plaque: Principal | ICD-10-CM

## 2015-03-12 DIAGNOSIS — N183 Chronic kidney disease, stage 3 unspecified: Secondary | ICD-10-CM

## 2015-03-12 DIAGNOSIS — I350 Nonrheumatic aortic (valve) stenosis: Secondary | ICD-10-CM | POA: Diagnosis not present

## 2015-03-12 LAB — BASIC METABOLIC PANEL
BUN: 29 mg/dL — AB (ref 7–25)
CALCIUM: 8.9 mg/dL (ref 8.6–10.3)
CHLORIDE: 99 mmol/L (ref 98–110)
CO2: 29 mmol/L (ref 20–31)
CREATININE: 1.57 mg/dL — AB (ref 0.70–1.11)
Glucose, Bld: 119 mg/dL — ABNORMAL HIGH (ref 65–99)
Potassium: 3.7 mmol/L (ref 3.5–5.3)
Sodium: 140 mmol/L (ref 135–146)

## 2015-03-12 LAB — CBC
HCT: 42.8 % (ref 39.0–52.0)
Hemoglobin: 14 g/dL (ref 13.0–17.0)
MCH: 29.5 pg (ref 26.0–34.0)
MCHC: 32.7 g/dL (ref 30.0–36.0)
MCV: 90.3 fL (ref 78.0–100.0)
MPV: 9.8 fL (ref 8.6–12.4)
PLATELETS: 174 10*3/uL (ref 150–400)
RBC: 4.74 MIL/uL (ref 4.22–5.81)
RDW: 17.2 % — AB (ref 11.5–15.5)
WBC: 7.2 10*3/uL (ref 4.0–10.5)

## 2015-03-12 NOTE — Progress Notes (Signed)
Cardiology Office Note Date:  03/12/2015   ID:  [name], DOB [birth year], MRN [id number]  PCP:  [name], MD  Cardiologist:  Dr Donnie Aho  No chief complaint on file.   History of Present Illness: [name] is a 81 y.o. male who presents for evaluation of symptomatic aortic stenosis. The patient has been followed by Dr. Donnie Aho. He has a complex medical history. This includes history of staphylococcal pericarditis. He underwent a pericardial window in 2007. The patient has coronary artery disease and he has undergone PCI in the past. His most recent cardiac catheterization and PCI was performed in 2010 when he underwent stenting and balloon angioplasty of the left circumflex/obtuse marginal bifurcation. He has been followed for aortic stenosis which has been moderate by serial echocardiography. However, his transvalvular gradients have been increasing and clinically he is having more frequent exacerbations of congestive heart failure. He is referred for consideration of TAVR.  The patient has been hospitalized twice with heart failure in the past year. His most recent hospitalization was prolonged and he was admitted June 18 and discharged July 7. He then spent over 3 weeks at [facility] nursing home. He is now back to independent living and the patient is here today with a friend.   During his most recent hospitalization, he was noted to have staph bacteremia and was treated with 4 weeks of antibiotics. Transthoracic echocardiogram did not show any evidence of cardiac vegetation. He was felt to be too sick to undergo transesophageal echocardiogram. The patient had hypoxic and hypercarbic respiratory failure and required mechanical ventilation. He ultimately improved with supportive care. He was treated with one month of IV vancomycin after a PICC line was placed. His cardiac enzymes were elevated in the setting of his acute illness.  He is now feeling better than he has felt in some  time. He continues to be limited by shortness of breath with activity. He denies chest pain or pressure. He does admit to leg swelling but denies orthopnea or PND. No lightheadedness or syncope.  The patient's most recent echocardiogram from 01/09/2015 demonstrated normal LV systolic function with an ejection fraction of 55-60% and moderate aortic stenosis with a peak velocity of 386 cm/s and mean gradient of 32 mmHg. The mitral annulus was noted to be calcified and there was left atrial dilatation present. Transaortic valve gradients had increased from previous studies.  Past Medical History  Diagnosis Date  . Gout, unspecified     severe dz, "treatment done that last 15 years"  . Obesity (BMI 30-39.9)   . Restless leg syndrome   . History of retinal detachment 1994  . Hypertensive heart disease   . CAD (coronary artery disease), native coronary artery   . History of pericarditis 2007    MSSA, s/p pericardial window  . GERD   . Osteoarthritis   . Sleep apnea in adult     CPAP qhs  . TIA (transient ischemic attack) 2005  . BPH (benign prostatic hypertrophy)     "minor"  . Lumbar spinal stenosis   . Carotid artery occlusion   . Unspecified venous (peripheral) insufficiency   . Stroke May 2003  . Anxiety   . Depression   . Aortic stenosis     ECHO 05/29/14 moderate AS Mean gradient 21 mm EF 55% ECHO 07/09/13  Mean gradient 12 mm Mild to moderate AS EF 50%    . CAD (coronary artery disease)     02/11/09  Xience stent to circumflex 2.5  x 18 mm postdilated to 2.75 mm  Cath sept 2011  normal Left main, 20 % stenosis proximal LAD, 40% stenosis mid LAD, 70% stenosis proximal Diag 1, 90% stenosis prox CFX, 90% first OM, small and nondominant RCA;   . Carotid artery disease     Prior TIA 2006 with left CEA by Dr. Arbie Cookey Recurrent TIA in April 2011   . Type 2 diabetes mellitus with vascular disease   . Lumbar disc disease   . CKD (chronic kidney disease), stage III 02/08/2014  . CHF (congestive  heart failure)     Past Surgical History  Procedure Laterality Date  . Pericardial window  2007  . Left arm  2008    shoulder  . Right arm  1970's    shoulder  . Cataract extraction      Left side x's 2 and right  . Retinal detachment surgery      left side  . Carotid endarterectomy Left 12-06-05    cea  . Coronary angioplasty  01/2009    x 1 stent  . Colonoscopy    . Carpal tunnel release Bilateral   . Back surgery  2013    removed bone spurs  . Inguinal hernia repair Left 10/01/2013    Procedure: HERNIA REPAIR INGUINAL ADULT;  Surgeon: Axel Filler, MD;  Location: [facility];  Service: General;  Laterality: Left;  . Insertion of mesh Left 10/01/2013    Procedure: INSERTION OF MESH;  Surgeon: Axel Filler, MD;  Location: [facility];  Service: General;  Laterality: Left;  . Finger amputation  May 2015    JUST TO FIRST JOINT RIGHT HAND  LAST FINGER ( Right 5th finger)  . Peripheral vascular catheterization N/A 01/08/2015    Procedure: Abdominal Aortogram;  Surgeon: Chuck Hint, MD;  Location: [facility];  Service: Cardiovascular;  Laterality: N/A;    Current Outpatient Prescriptions  Medication Sig Dispense Refill  . acetaminophen (TYLENOL) 325 MG tablet Take 2 tablets (650 mg total) by mouth every 6 (six) hours as needed for mild pain or moderate pain. (Patient taking differently: Take 325 mg by mouth every 6 (six) hours as needed for mild pain or moderate pain. )    . allopurinol (ZYLOPRIM) 300 MG tablet Take 1 tablet (300 mg total) by mouth every morning. 90 tablet 1  . aspirin EC 81 MG tablet Take 81 mg by mouth daily.    Marland Kitchen buPROPion (WELLBUTRIN XL) 150 MG 24 hr tablet Take 1 tablet (150 mg total) by mouth every morning. 90 tablet 1  . cholecalciferol (VITAMIN D) 1000 UNITS tablet Take 1,000 Units by mouth daily.    . clopidogrel (PLAVIX) 75 MG tablet Take 1 tablet (75 mg total) by mouth daily. 90 tablet 3  . docusate sodium 100 MG CAPS Take 100 mg by mouth 2  (two) times daily. 10 capsule 0  . Ferrous Gluconate 256 (28 FE) MG TABS Take 256 mg by mouth daily.    . furosemide (LASIX) 40 MG tablet Take 1 tablet (40 mg total) by mouth daily. (Patient taking differently: Take 40 mg by mouth 2 (two) times daily. )    . Magnesium 250 MG TABS Take 250 mg by mouth daily.     . metoprolol succinate (TOPROL-XL) 50 MG 24 hr tablet Take 1 tablet (50 mg total) by mouth daily. Take with or immediately following a meal. 90 tablet 3  . mirabegron ER (MYRBETRIQ) 50 MG TB24 tablet Take 1 tablet (50 mg  total) by mouth daily. 90 tablet 1  . mirtazapine (REMERON) 15 MG tablet Take 1 tablet (15 mg total) by mouth at bedtime. 30 tablet 11  . pantoprazole (PROTONIX) 40 MG tablet Take 1 tablet (40 mg total) by mouth 2 (two) times daily. 90 tablet 1  . pregabalin (LYRICA) 200 MG capsule Take 1 capsule (200 mg total) by mouth at bedtime. 90 capsule 1  . rOPINIRole (REQUIP) 4 MG tablet Take 1 tablet (4 mg total) by mouth at bedtime. 90 tablet 1  . tamsulosin (FLOMAX) 0.4 MG CAPS capsule Take 1 capsule (0.4 mg total) by mouth daily. 90 capsule 3   No current facility-administered medications for this visit.   Allergies:   Statins   Social History:  The patient  reports that he has never smoked. He has never used smokeless tobacco. He reports that he does not drink alcohol or use illicit drugs.   Family History:  The patient's  family history includes Breast cancer in his sister; Cancer in his brother and sister; Diabetes in his daughter, father, and son; Heart attack in his mother; Heart disease in his father and mother; Hypertension in his son; Lung cancer in his brother; Prostate cancer in his brother.   ROS:  Please see the history of present illness.  Otherwise, review of systems is positive for orthopnea, PND, anxiety, and gait instability.  All other systems are reviewed and negative.   PHYSICAL EXAM: VS:  BP 136/66 mmHg  Pulse 88  Ht 5\' 10"  (1.778 m)  Wt 231 lb 6.4 oz  (104.962 kg)  BMI 33.20 kg/m2  SpO2 95% , BMI Body mass index is 33.2 kg/(m^2). GEN: Pleasant elderly male, in no acute distress HEENT: normal Neck: no JVD, no masses. Carotid upstrokes are delayed with bilateral bruits Cardiac: Regular rate and rhythm with a late peaking harsh systolic murmur best heard at the right upper sternal border with markedly diminished A2 component                Respiratory:  clear to auscultation bilaterally, normal work of breathing GI: soft, nontender, nondistended, + BS MS: no deformity or atrophy Ext: Trace pretibial edema Skin: There is mild erythema of the lower legs. There is a right leg dressing in place over the shin Neuro:  Strength and sensation are intact Psych: euthymic mood, full affect  EKG:  EKG is not ordered today.  Recent Labs: 07/13/2014: Pro B Natriuretic peptide (BNP) 814.6* 07/14/2014: TSH 3.608 01/10/2015: B Natriuretic Peptide 285.0* 01/19/2015: ALT 34 01/23/2015: Magnesium 3.2* 01/25/2015: Hemoglobin 14.4; Platelets 161 01/27/2015: BUN 16; Creatinine, Ser 0.86; Potassium 4.0; Sodium 141   Lipid Panel     Component Value Date/Time   CHOL 190 07/15/2014 0325   TRIG 312* 01/16/2015 1157   TRIG 154 04/13/2010   HDL 32* 07/15/2014 0325   CHOLHDL 5.9 07/15/2014 0325   VLDL 30 07/15/2014 0325   LDLCALC 128* 07/15/2014 0325   LDLDIRECT 134.4 02/06/2013 1126      Wt Readings from Last 3 Encounters:  03/12/15 231 lb 6.4 oz (104.962 kg)  03/04/15 237 lb (107.502 kg)  02/26/15 233 lb (105.688 kg)     Cardiac Studies Reviewed: 2-D echocardiogram 01/09/2015: Study Conclusions  - Left ventricle: The cavity size was normal. There was mild concentric hypertrophy. Systolic function was normal. The estimated ejection fraction was in the range of 55% to 60%. Although no diagnostic regional wall motion abnormality was identified, this possibility cannot be completely excluded on the  basis of this study. Features are consistent  with a pseudonormal left ventricular filling pattern, with concomitant abnormal relaxation and increased filling pressure (grade 2 diastolic dysfunction). - Aortic valve: Cusp separation was reduced. There was moderate stenosis. Peak velocity (S): 386 cm/s. Mean gradient (S): 32 mm Hg. - Mitral valve: Moderately calcified annulus. - Left atrium: The atrium was mildly to moderately dilated.  Impressions:  - When compared to prior echocardiogram, aortic stenosis has advanced.  Procedure: AV Replacement  Risk of Mortality: 7.298%  Morbidity or Mortality: 36.87%  Long Length of Stay: 23.762%  Short Length of Stay: 11.738%  Permanent Stroke: 3.88%  Prolonged Ventilation: 25.4%  DSW Infection: 0.691%  Renal Failure: 15.406%  Reoperation: 12.62%   ASSESSMENT AND PLAN: 81 year old gentleman with multiple medical problems outlined above, who has at least moderate and probably severe stage D symptomatic aortic stenosis. He has had 2 episodes of acute diastolic heart failure and respiratory failure over the past year requiring mechanical ventilation. While this transvalvular gradients are in the moderate range, his physical exam is more suggestive of severe aortic stenosis. I have personally reviewed his echo images and he does have calcification and at least moderate restriction of all 3 aortic valve leaflets.  I have reviewed the natural history of aortic stenosis with the patient today. We have discussed the limitations of medical therapy and the poor prognosis associated with symptomatic aortic stenosis. We have also reviewed potential treatment options, including palliative medical therapy, conventional surgical aortic valve replacement, and transcatheter aortic valve replacement. We discussed treatment options in the context of this patient's specific comorbid medical conditions.   Because of his multiple comorbid conditions and advanced age, if he is confirmed to have severe  aortic stenosis, TAVR would be an attractive treatment option. His STS-PROM is at least moderately elevated and I think his risk is higher than that predicted by the risk calculator as recurrent hypercarbic respiratory failure is not well-accounted for. I have recommended right and left heart catheterization for further evaluation of the patient's aortic stenosis as well as his known coronary artery disease. Will directly measure transaortic gradients at the time of cardiac catheterization. I have reviewed the risks, indications, and alternatives to cardiac catheterization  with the patient. Risks include but are not limited to bleeding, infection, vascular injury, stroke, myocardial infection, arrhythmia, kidney injury, radiation-related injury in the case of prolonged fluoroscopy use, emergency cardiac surgery, and death. The patient understands the risks of serious complication is low (<1%). Further plans and multidisciplinary valve team evaluation pending cardiac catheterization results.  Current medicines are reviewed with the patient today.  The patient does not have concerns regarding medicines.  Labs/ tests ordered today include:  No orders of the defined types were placed in this encounter.   Disposition:   FU pending evaluation outlined above  Signed, Tonny Bollman, MD  03/12/2015 3:07 PM    [facility] [address] [place], [place], [place]  [address] Phone: [phone]; Fax: [phone]

## 2015-03-12 NOTE — Patient Instructions (Signed)
Medication Instructions:  Your physician recommends that you continue on your current medications as directed. Please refer to the Current Medication list given to you today.  Labwork: Your physician recommends that you have lab work today: BMP, CBC and PT/INR  Testing/Procedures: Your physician has requested that you have a cardiac catheterization. Cardiac catheterization is used to diagnose and/or treat various heart conditions. Doctors may recommend this procedure for a number of different reasons. The most common reason is to evaluate chest pain. Chest pain can be a symptom of coronary artery disease (CAD), and cardiac catheterization can show whether plaque is narrowing or blocking your heart's arteries. This procedure is also used to evaluate the valves, as well as measure the blood flow and oxygen levels in different parts of your heart. For further information please visit www.cardiosmart.org. Please follow instruction sheet, as given.  Follow-Up: We will arrange further follow-up after your cardiac catheterization.   Any Other Special Instructions Will Be Listed Below (If Applicable).   

## 2015-03-13 LAB — PROTIME-INR
INR: 0.98 (ref <1.50)
Prothrombin Time: 13 seconds (ref 11.6–15.2)

## 2015-03-14 ENCOUNTER — Encounter: Payer: Self-pay | Admitting: Cardiovascular Disease

## 2015-03-14 ENCOUNTER — Other Ambulatory Visit: Payer: Self-pay | Admitting: Cardiovascular Disease

## 2015-03-14 DIAGNOSIS — I350 Nonrheumatic aortic (valve) stenosis: Secondary | ICD-10-CM

## 2015-03-15 ENCOUNTER — Encounter (HOSPITAL_COMMUNITY)
Admission: RE | Disposition: A | Discharge status: Home / Self Care | Payer: Self-pay | Source: Ambulatory Visit | Attending: Cardiovascular Disease

## 2015-03-15 ENCOUNTER — Ambulatory Visit (HOSPITAL_COMMUNITY)
Admission: RE | Admit: 2015-03-15 | Discharge: 2015-03-15 | Disposition: A | Discharge status: Home / Self Care | Payer: Medicare Other | Source: Ambulatory Visit | Attending: Cardiovascular Disease | Admitting: Cardiovascular Disease

## 2015-03-15 DIAGNOSIS — G2581 Restless legs syndrome: Secondary | ICD-10-CM | POA: Insufficient documentation

## 2015-03-15 DIAGNOSIS — M1990 Unspecified osteoarthritis, unspecified site: Secondary | ICD-10-CM | POA: Diagnosis not present

## 2015-03-15 DIAGNOSIS — E1122 Type 2 diabetes mellitus with diabetic chronic kidney disease: Secondary | ICD-10-CM | POA: Diagnosis not present

## 2015-03-15 DIAGNOSIS — I1310 Hypertensive heart and chronic kidney disease without heart failure, with stage 1 through stage 4 chronic kidney disease, or unspecified chronic kidney disease: Secondary | ICD-10-CM | POA: Insufficient documentation

## 2015-03-15 DIAGNOSIS — N183 Chronic kidney disease, stage 3 (moderate): Secondary | ICD-10-CM | POA: Diagnosis not present

## 2015-03-15 DIAGNOSIS — Z7902 Long term (current) use of antithrombotics/antiplatelets: Secondary | ICD-10-CM | POA: Diagnosis not present

## 2015-03-15 DIAGNOSIS — F419 Anxiety disorder, unspecified: Secondary | ICD-10-CM | POA: Diagnosis not present

## 2015-03-15 DIAGNOSIS — K219 Gastro-esophageal reflux disease without esophagitis: Secondary | ICD-10-CM | POA: Diagnosis not present

## 2015-03-15 DIAGNOSIS — Z6833 Body mass index (BMI) 33.0-33.9, adult: Secondary | ICD-10-CM | POA: Insufficient documentation

## 2015-03-15 DIAGNOSIS — E669 Obesity, unspecified: Secondary | ICD-10-CM | POA: Diagnosis not present

## 2015-03-15 DIAGNOSIS — Z8673 Personal history of transient ischemic attack (TIA), and cerebral infarction without residual deficits: Secondary | ICD-10-CM | POA: Insufficient documentation

## 2015-03-15 DIAGNOSIS — Z7982 Long term (current) use of aspirin: Secondary | ICD-10-CM | POA: Diagnosis not present

## 2015-03-15 DIAGNOSIS — I5031 Acute diastolic (congestive) heart failure: Secondary | ICD-10-CM | POA: Insufficient documentation

## 2015-03-15 DIAGNOSIS — I350 Nonrheumatic aortic (valve) stenosis: Secondary | ICD-10-CM

## 2015-03-15 DIAGNOSIS — I2510 Atherosclerotic heart disease of native coronary artery without angina pectoris: Secondary | ICD-10-CM | POA: Diagnosis not present

## 2015-03-15 DIAGNOSIS — G4730 Sleep apnea, unspecified: Secondary | ICD-10-CM | POA: Insufficient documentation

## 2015-03-15 DIAGNOSIS — N400 Enlarged prostate without lower urinary tract symptoms: Secondary | ICD-10-CM | POA: Insufficient documentation

## 2015-03-15 DIAGNOSIS — M109 Gout, unspecified: Secondary | ICD-10-CM | POA: Insufficient documentation

## 2015-03-15 DIAGNOSIS — Z955 Presence of coronary angioplasty implant and graft: Secondary | ICD-10-CM | POA: Insufficient documentation

## 2015-03-15 DIAGNOSIS — F329 Major depressive disorder, single episode, unspecified: Secondary | ICD-10-CM | POA: Insufficient documentation

## 2015-03-15 DIAGNOSIS — E1151 Type 2 diabetes mellitus with diabetic peripheral angiopathy without gangrene: Secondary | ICD-10-CM | POA: Diagnosis not present

## 2015-03-15 HISTORY — PX: CARDIAC CATHETERIZATION: SHX172

## 2015-03-15 LAB — POCT I-STAT 3, VENOUS BLOOD GAS (G3P V)
ACID-BASE EXCESS: 5 mmol/L — AB (ref 0.0–2.0)
ACID-BASE EXCESS: 4 mmol/L — AB (ref 0.0–2.0)
BICARBONATE: 31.7 meq/L — AB (ref 20.0–24.0)
Bicarbonate: 30.4 mEq/L — ABNORMAL HIGH (ref 20.0–24.0)
O2 SAT: 55 %
O2 SAT: 57 %
PO2 VEN: 30 mmHg (ref 30.0–45.0)
TCO2: 33 mmol/L (ref 0–100)
TCO2: 32 mmol/L (ref 0–100)
pCO2, Ven: 52.6 mmHg — ABNORMAL HIGH (ref 45.0–50.0)
pCO2, Ven: 54.7 mmHg — ABNORMAL HIGH (ref 45.0–50.0)
pH, Ven: 7.388 — ABNORMAL HIGH (ref 7.250–7.300)
pH, Ven: 7.353 — ABNORMAL HIGH (ref 7.250–7.300)
pO2, Ven: 32 mmHg (ref 30.0–45.0)

## 2015-03-15 LAB — POCT I-STAT 3, ART BLOOD GAS (G3+)
ACID-BASE EXCESS: 4 mmol/L — AB (ref 0.0–2.0)
BICARBONATE: 29.8 meq/L — AB (ref 20.0–24.0)
O2 SAT: 90 %
PO2 ART: 59 mmHg — AB (ref 80.0–100.0)
TCO2: 31 mmol/L (ref 0–100)
pCO2 arterial: 45.9 mmHg — ABNORMAL HIGH (ref 35.0–45.0)
pH, Arterial: 7.42 (ref 7.350–7.450)

## 2015-03-15 SURGERY — RIGHT/LEFT HEART CATH AND CORONARY ANGIOGRAPHY
Anesthesia: LOCAL

## 2015-03-15 MED ORDER — HEPARIN SODIUM (PORCINE) 1000 UNIT/ML IJ SOLN
INTRAMUSCULAR | Status: AC
  Filled 2015-03-15: qty 1

## 2015-03-15 MED ORDER — FENTANYL CITRATE (PF) 100 MCG/2ML IJ SOLN
INTRAMUSCULAR | Status: AC
  Filled 2015-03-15: qty 4

## 2015-03-15 MED ORDER — NITROGLYCERIN 1 MG/10 ML FOR IR/CATH LAB
INTRA_ARTERIAL | Status: AC
Start: 2015-03-15 — End: 2015-03-15
  Filled 2015-03-15: qty 10

## 2015-03-15 MED ORDER — HEPARIN SODIUM (PORCINE) 1000 UNIT/ML IJ SOLN
INTRAMUSCULAR | Status: DC | PRN
  Administered 2015-03-15: 5000 [IU] via INTRAVENOUS

## 2015-03-15 MED ORDER — HEPARIN (PORCINE) IN NACL 2-0.9 UNIT/ML-% IJ SOLN
INTRAMUSCULAR | Status: AC
  Filled 2015-03-15: qty 1000

## 2015-03-15 MED ORDER — VERAPAMIL HCL 2.5 MG/ML IV SOLN
INTRAVENOUS | Status: DC | PRN
  Administered 2015-03-15: 13:00:00 via INTRA_ARTERIAL

## 2015-03-15 MED ORDER — ACETAMINOPHEN 325 MG PO TABS: 650.0000 mg | ORAL_TABLET | ORAL | Status: DC | PRN

## 2015-03-15 MED ORDER — SODIUM CHLORIDE 0.9 % IJ SOLN: 3.0000 mL | Freq: Two times a day (BID) | INTRAMUSCULAR | Status: DC

## 2015-03-15 MED ORDER — MIDAZOLAM HCL 2 MG/2ML IJ SOLN
INTRAMUSCULAR | Status: AC
  Filled 2015-03-15: qty 4

## 2015-03-15 MED ORDER — SODIUM CHLORIDE 0.9 % IV SOLN: INTRAVENOUS | Status: DC

## 2015-03-15 MED ORDER — SODIUM CHLORIDE 0.9 % IJ SOLN: 3.0000 mL | INTRAMUSCULAR | Status: DC | PRN

## 2015-03-15 MED ORDER — ONDANSETRON HCL 4 MG/2ML IJ SOLN: 4.0000 mg | Freq: Four times a day (QID) | INTRAMUSCULAR | Status: DC | PRN

## 2015-03-15 MED ORDER — LIDOCAINE HCL (PF) 1 % IJ SOLN
INTRAMUSCULAR | Status: AC
  Filled 2015-03-15: qty 30

## 2015-03-15 MED ORDER — VERAPAMIL HCL 2.5 MG/ML IV SOLN
INTRAVENOUS | Status: AC
  Filled 2015-03-15: qty 2

## 2015-03-15 MED ORDER — FENTANYL CITRATE (PF) 100 MCG/2ML IJ SOLN
INTRAMUSCULAR | Status: DC | PRN
  Administered 2015-03-15: 25 ug via INTRAVENOUS

## 2015-03-15 MED ORDER — SODIUM CHLORIDE 0.9 % IV SOLN: 250.0000 mL | INTRAVENOUS | Status: DC | PRN

## 2015-03-15 MED ORDER — IOHEXOL 350 MG/ML SOLN
INTRAVENOUS | Status: DC | PRN
  Administered 2015-03-15: 60 mL via INTRAVENOUS

## 2015-03-15 MED ORDER — SODIUM CHLORIDE 0.9 % WEIGHT BASED INFUSION: 1.0000 mL/kg/h | INTRAVENOUS | Status: DC

## 2015-03-15 MED ORDER — MIDAZOLAM HCL 2 MG/2ML IJ SOLN
INTRAMUSCULAR | Status: DC | PRN
  Administered 2015-03-15: 1 mg via INTRAVENOUS

## 2015-03-15 MED ORDER — SODIUM CHLORIDE 0.9 % WEIGHT BASED INFUSION
3.0000 mL/kg/h | INTRAVENOUS | Status: DC
  Administered 2015-03-15: 3 mL/kg/h via INTRAVENOUS

## 2015-03-15 MED ORDER — LIDOCAINE HCL (PF) 1 % IJ SOLN
INTRAMUSCULAR | Status: DC | PRN
  Administered 2015-03-15: 14:00:00

## 2015-03-15 SURGICAL SUPPLY — 15 items
CATH BALLN WEDGE 5F 110CM (CATHETERS) ×1 IMPLANT
CATH INFINITI 5 FR AL2 (CATHETERS) ×1 IMPLANT
CATH INFINITI 5 FR JL3.5 (CATHETERS) ×2 IMPLANT
CATH INFINITI 5FR ANG PIGTAIL (CATHETERS) ×2 IMPLANT
CATH INFINITI AR1 MOD (CATHETERS) ×1 IMPLANT
CATH INFINITI JR4 5F (CATHETERS) ×2 IMPLANT
CATH SITESEER 5F NTR (CATHETERS) ×1 IMPLANT
DEVICE RAD COMP TR BAND LRG (VASCULAR PRODUCTS) ×2 IMPLANT
GLIDESHEATH SLEND SS 6F .021 (SHEATH) ×2 IMPLANT
KIT HEART LEFT (KITS) ×2 IMPLANT
PACK CARDIAC CATHETERIZATION (CUSTOM PROCEDURE TRAY) ×2 IMPLANT
SHEATH FAST CATH BRACH 5F 5CM (SHEATH) ×1 IMPLANT
TRANSDUCER W/STOPCOCK (MISCELLANEOUS) ×3 IMPLANT
TUBING CIL FLEX 10 FLL-RA (TUBING) ×2 IMPLANT
WIRE SAFE-T 1.5MM-J .035X260CM (WIRE) ×2 IMPLANT

## 2015-03-15 NOTE — H&P (View-Only) (Signed)
Cardiology Office Note Date:  03/12/2015   ID:  [name], DOB [birth year], MRN [id number]  PCP:  [name], MD  Cardiologist:  Dr Donnie Aho  No chief complaint on file.   History of Present Illness: [name] is a 81 y.o. male who presents for evaluation of symptomatic aortic stenosis. The patient has been followed by Dr. Donnie Aho. He has a complex medical history. This includes history of staphylococcal pericarditis. He underwent a pericardial window in 2007. The patient has coronary artery disease and he has undergone PCI in the past. His most recent cardiac catheterization and PCI was performed in 2010 when he underwent stenting and balloon angioplasty of the left circumflex/obtuse marginal bifurcation. He has been followed for aortic stenosis which has been moderate by serial echocardiography. However, his transvalvular gradients have been increasing and clinically he is having more frequent exacerbations of congestive heart failure. He is referred for consideration of TAVR.  The patient has been hospitalized twice with heart failure in the past year. His most recent hospitalization was prolonged and he was admitted June 18 and discharged July 7. He then spent over 3 weeks at [facility] nursing home. He is now back to independent living and the patient is here today with a friend.   During his most recent hospitalization, he was noted to have staph bacteremia and was treated with 4 weeks of antibiotics. Transthoracic echocardiogram did not show any evidence of cardiac vegetation. He was felt to be too sick to undergo transesophageal echocardiogram. The patient had hypoxic and hypercarbic respiratory failure and required mechanical ventilation. He ultimately improved with supportive care. He was treated with one month of IV vancomycin after a PICC line was placed. His cardiac enzymes were elevated in the setting of his acute illness.  He is now feeling better than he has felt in some  time. He continues to be limited by shortness of breath with activity. He denies chest pain or pressure. He does admit to leg swelling but denies orthopnea or PND. No lightheadedness or syncope.  The patient's most recent echocardiogram from 01/09/2015 demonstrated normal LV systolic function with an ejection fraction of 55-60% and moderate aortic stenosis with a peak velocity of 386 cm/s and mean gradient of 32 mmHg. The mitral annulus was noted to be calcified and there was left atrial dilatation present. Transaortic valve gradients had increased from previous studies.  Past Medical History  Diagnosis Date  . Gout, unspecified     severe dz, "treatment done that last 15 years"  . Obesity (BMI 30-39.9)   . Restless leg syndrome   . History of retinal detachment 1994  . Hypertensive heart disease   . CAD (coronary artery disease), native coronary artery   . History of pericarditis 2007    MSSA, s/p pericardial window  . GERD   . Osteoarthritis   . Sleep apnea in adult     CPAP qhs  . TIA (transient ischemic attack) 2005  . BPH (benign prostatic hypertrophy)     "minor"  . Lumbar spinal stenosis   . Carotid artery occlusion   . Unspecified venous (peripheral) insufficiency   . Stroke May 2003  . Anxiety   . Depression   . Aortic stenosis     ECHO 05/29/14 moderate AS Mean gradient 21 mm EF 55% ECHO 07/09/13  Mean gradient 12 mm Mild to moderate AS EF 50%    . CAD (coronary artery disease)     02/11/09  Xience stent to circumflex 2.5  x 18 mm postdilated to 2.75 mm  Cath sept 2011  normal Left main, 20 % stenosis proximal LAD, 40% stenosis mid LAD, 70% stenosis proximal Diag 1, 90% stenosis prox CFX, 90% first OM, small and nondominant RCA;   . Carotid artery disease     Prior TIA 2006 with left CEA by Dr. Arbie Cookey Recurrent TIA in April 2011   . Type 2 diabetes mellitus with vascular disease   . Lumbar disc disease   . CKD (chronic kidney disease), stage III 02/08/2014  . CHF (congestive  heart failure)     Past Surgical History  Procedure Laterality Date  . Pericardial window  2007  . Left arm  2008    shoulder  . Right arm  1970's    shoulder  . Cataract extraction      Left side x's 2 and right  . Retinal detachment surgery      left side  . Carotid endarterectomy Left 12-06-05    cea  . Coronary angioplasty  01/2009    x 1 stent  . Colonoscopy    . Carpal tunnel release Bilateral   . Back surgery  2013    removed bone spurs  . Inguinal hernia repair Left 10/01/2013    Procedure: HERNIA REPAIR INGUINAL ADULT;  Surgeon: Axel Filler, MD;  Location: [facility];  Service: General;  Laterality: Left;  . Insertion of mesh Left 10/01/2013    Procedure: INSERTION OF MESH;  Surgeon: Axel Filler, MD;  Location: [facility];  Service: General;  Laterality: Left;  . Finger amputation  May 2015    JUST TO FIRST JOINT RIGHT HAND  LAST FINGER ( Right 5th finger)  . Peripheral vascular catheterization N/A 01/08/2015    Procedure: Abdominal Aortogram;  Surgeon: Chuck Hint, MD;  Location: [facility] INVASIVE CV LAB;  Service: Cardiovascular;  Laterality: N/A;    Current Outpatient Prescriptions  Medication Sig Dispense Refill  . acetaminophen (TYLENOL) 325 MG tablet Take 2 tablets (650 mg total) by mouth every 6 (six) hours as needed for mild pain or moderate pain. (Patient taking differently: Take 325 mg by mouth every 6 (six) hours as needed for mild pain or moderate pain. )    . allopurinol (ZYLOPRIM) 300 MG tablet Take 1 tablet (300 mg total) by mouth every morning. 90 tablet 1  . aspirin EC 81 MG tablet Take 81 mg by mouth daily.    Marland Kitchen buPROPion (WELLBUTRIN XL) 150 MG 24 hr tablet Take 1 tablet (150 mg total) by mouth every morning. 90 tablet 1  . cholecalciferol (VITAMIN D) 1000 UNITS tablet Take 1,000 Units by mouth daily.    . clopidogrel (PLAVIX) 75 MG tablet Take 1 tablet (75 mg total) by mouth daily. 90 tablet 3  . docusate sodium 100 MG CAPS Take 100 mg by mouth 2  (two) times daily. 10 capsule 0  . Ferrous Gluconate 256 (28 FE) MG TABS Take 256 mg by mouth daily.    . furosemide (LASIX) 40 MG tablet Take 1 tablet (40 mg total) by mouth daily. (Patient taking differently: Take 40 mg by mouth 2 (two) times daily. )    . Magnesium 250 MG TABS Take 250 mg by mouth daily.     . metoprolol succinate (TOPROL-XL) 50 MG 24 hr tablet Take 1 tablet (50 mg total) by mouth daily. Take with or immediately following a meal. 90 tablet 3  . mirabegron ER (MYRBETRIQ) 50 MG TB24 tablet Take 1 tablet (50 mg  total) by mouth daily. 90 tablet 1  . mirtazapine (REMERON) 15 MG tablet Take 1 tablet (15 mg total) by mouth at bedtime. 30 tablet 11  . pantoprazole (PROTONIX) 40 MG tablet Take 1 tablet (40 mg total) by mouth 2 (two) times daily. 90 tablet 1  . pregabalin (LYRICA) 200 MG capsule Take 1 capsule (200 mg total) by mouth at bedtime. 90 capsule 1  . rOPINIRole (REQUIP) 4 MG tablet Take 1 tablet (4 mg total) by mouth at bedtime. 90 tablet 1  . tamsulosin (FLOMAX) 0.4 MG CAPS capsule Take 1 capsule (0.4 mg total) by mouth daily. 90 capsule 3   No current facility-administered medications for this visit.   Allergies:   Statins   Social History:  The patient  reports that he has never smoked. He has never used smokeless tobacco. He reports that he does not drink alcohol or use illicit drugs.   Family History:  The patient's  family history includes Breast cancer in his sister; Cancer in his brother and sister; Diabetes in his daughter, father, and son; Heart attack in his mother; Heart disease in his father and mother; Hypertension in his son; Lung cancer in his brother; Prostate cancer in his brother.   ROS:  Please see the history of present illness.  Otherwise, review of systems is positive for orthopnea, PND, anxiety, and gait instability.  All other systems are reviewed and negative.   PHYSICAL EXAM: VS:  BP 136/66 mmHg  Pulse 88  Ht 5\' 10"  (1.778 m)  Wt 231 lb 6.4 oz  (104.962 kg)  BMI 33.20 kg/m2  SpO2 95% , BMI Body mass index is 33.2 kg/(m^2). GEN: Pleasant elderly male, in no acute distress HEENT: normal Neck: no JVD, no masses. Carotid upstrokes are delayed with bilateral bruits Cardiac: Regular rate and rhythm with a late peaking harsh systolic murmur best heard at the right upper sternal border with markedly diminished A2 component                Respiratory:  clear to auscultation bilaterally, normal work of breathing GI: soft, nontender, nondistended, + BS MS: no deformity or atrophy Ext: Trace pretibial edema Skin: There is mild erythema of the lower legs. There is a right leg dressing in place over the shin Neuro:  Strength and sensation are intact Psych: euthymic mood, full affect  EKG:  EKG is not ordered today.  Recent Labs: 07/13/2014: Pro B Natriuretic peptide (BNP) 814.6* 07/14/2014: TSH 3.608 01/10/2015: B Natriuretic Peptide 285.0* 01/19/2015: ALT 34 01/23/2015: Magnesium 3.2* 01/25/2015: Hemoglobin 14.4; Platelets 161 01/27/2015: BUN 16; Creatinine, Ser 0.86; Potassium 4.0; Sodium 141   Lipid Panel     Component Value Date/Time   CHOL 190 07/15/2014 0325   TRIG 312* 01/16/2015 1157   TRIG 154 04/13/2010   HDL 32* 07/15/2014 0325   CHOLHDL 5.9 07/15/2014 0325   VLDL 30 07/15/2014 0325   LDLCALC 128* 07/15/2014 0325   LDLDIRECT 134.4 02/06/2013 1126      Wt Readings from Last 3 Encounters:  03/12/15 231 lb 6.4 oz (104.962 kg)  03/04/15 237 lb (107.502 kg)  02/26/15 233 lb (105.688 kg)     Cardiac Studies Reviewed: 2-D echocardiogram 01/09/2015: Study Conclusions  - Left ventricle: The cavity size was normal. There was mild concentric hypertrophy. Systolic function was normal. The estimated ejection fraction was in the range of 55% to 60%. Although no diagnostic regional wall motion abnormality was identified, this possibility cannot be completely excluded on the  basis of this study. Features are consistent  with a pseudonormal left ventricular filling pattern, with concomitant abnormal relaxation and increased filling pressure (grade 2 diastolic dysfunction). - Aortic valve: Cusp separation was reduced. There was moderate stenosis. Peak velocity (S): 386 cm/s. Mean gradient (S): 32 mm Hg. - Mitral valve: Moderately calcified annulus. - Left atrium: The atrium was mildly to moderately dilated.  Impressions:  - When compared to prior echocardiogram, aortic stenosis has advanced.  Procedure: AV Replacement  Risk of Mortality: 7.298%  Morbidity or Mortality: 36.87%  Long Length of Stay: 23.762%  Short Length of Stay: 11.738%  Permanent Stroke: 3.88%  Prolonged Ventilation: 25.4%  DSW Infection: 0.691%  Renal Failure: 15.406%  Reoperation: 12.62%   ASSESSMENT AND PLAN: 81 year old gentleman with multiple medical problems outlined above, who has at least moderate and probably severe stage D symptomatic aortic stenosis. He has had 2 episodes of acute diastolic heart failure and respiratory failure over the past year requiring mechanical ventilation. While this transvalvular gradients are in the moderate range, his physical exam is more suggestive of severe aortic stenosis. I have personally reviewed his echo images and he does have calcification and at least moderate restriction of all 3 aortic valve leaflets.  I have reviewed the natural history of aortic stenosis with the patient today. We have discussed the limitations of medical therapy and the poor prognosis associated with symptomatic aortic stenosis. We have also reviewed potential treatment options, including palliative medical therapy, conventional surgical aortic valve replacement, and transcatheter aortic valve replacement. We discussed treatment options in the context of this patient's specific comorbid medical conditions.   Because of his multiple comorbid conditions and advanced age, if he is confirmed to have severe  aortic stenosis, TAVR would be an attractive treatment option. His STS-PROM is at least moderately elevated and I think his risk is higher than that predicted by the risk calculator as recurrent hypercarbic respiratory failure is not well-accounted for. I have recommended right and left heart catheterization for further evaluation of the patient's aortic stenosis as well as his known coronary artery disease. Will directly measure transaortic gradients at the time of cardiac catheterization. I have reviewed the risks, indications, and alternatives to cardiac catheterization  with the patient. Risks include but are not limited to bleeding, infection, vascular injury, stroke, myocardial infection, arrhythmia, kidney injury, radiation-related injury in the case of prolonged fluoroscopy use, emergency cardiac surgery, and death. The patient understands the risks of serious complication is low (<1%). Further plans and multidisciplinary valve team evaluation pending cardiac catheterization results.  Current medicines are reviewed with the patient today.  The patient does not have concerns regarding medicines.  Labs/ tests ordered today include:  No orders of the defined types were placed in this encounter.   Disposition:   FU pending evaluation outlined above  Signed, Tonny Bollman, MD  03/12/2015 3:07 PM    [facility] [address] [place], [place], [place]  [address] Phone: [phone]; Fax: [phone]

## 2015-03-15 NOTE — Progress Notes (Signed)
Jennifer O'Neal,R.N. Aware of pt's lab results

## 2015-03-15 NOTE — Discharge Instructions (Signed)
Radial Site Care °Refer to this sheet in the next few weeks. These instructions provide you with information on caring for yourself after your procedure. Your caregiver may also give you more specific instructions. Your treatment has been planned according to current medical practices, but problems sometimes occur. Call your caregiver if you have any problems or questions after your procedure. °HOME CARE INSTRUCTIONS °· You may shower the day after the procedure. Remove the bandage (dressing) and gently wash the site with plain soap and water. Gently pat the site dry. °· Do not apply powder or lotion to the site. °· Do not submerge the affected site in water for 3 to 5 days. °· Inspect the site at least twice daily. °· Do not flex or bend the affected arm for 24 hours. °· No lifting over 5 pounds (2.3 kg) for 5 days after your procedure. °· Do not drive home if you are discharged the same day of the procedure. Have someone else drive you. °· You may drive 24 hours after the procedure unless otherwise instructed by your caregiver. °· Do not operate machinery or power tools for 24 hours. °· A responsible adult should be with you for the first 24 hours after you arrive home. °What to expect: °· Any bruising will usually fade within 1 to 2 weeks. °· Blood that collects in the tissue (hematoma) may be painful to the touch. It should usually decrease in size and tenderness within 1 to 2 weeks. °SEEK IMMEDIATE MEDICAL CARE IF: °· You have unusual pain at the radial site. °· You have redness, warmth, swelling, or pain at the radial site. °· You have drainage (other than a small amount of blood on the dressing). °· You have chills. °· You have a fever or persistent symptoms for more than 72 hours. °· You have a fever and your symptoms suddenly get worse. °· Your arm becomes pale, cool, tingly, or numb. °· You have heavy bleeding from the site. Hold pressure on the site. °Document Released: 08/12/2010 Document Revised:  10/02/2011 Document Reviewed: 08/12/2010 °ExitCare® Patient Information ©2015 [employer]. This information is not intended to replace advice given to you by your health care provider. Make sure you discuss any questions you have with your health care provider. ° °

## 2015-03-15 NOTE — Interval H&P Note (Signed)
History and Physical Interval Note:  03/15/2015 12:54 PM  [name]  has presented today for surgery, with the diagnosis of Aortic Stenosis  The various methods of treatment have been discussed with the patient and family. After consideration of risks, benefits and other options for treatment, the patient has consented to  Procedure(s): Right/Left Heart Cath and Coronary Angiography (N/A) as a surgical intervention .  The patient's history has been reviewed, patient examined, no change in status, stable for surgery.  I have reviewed the patient's chart and labs.  Questions were answered to the patient's satisfaction.     Tonny Bollman

## 2015-03-16 ENCOUNTER — Encounter (HOSPITAL_COMMUNITY): Payer: Self-pay | Admitting: Cardiovascular Disease

## 2015-03-16 ENCOUNTER — Other Ambulatory Visit: Payer: Self-pay

## 2015-03-16 ENCOUNTER — Other Ambulatory Visit: Payer: Self-pay | Admitting: Internal Medicine

## 2015-03-16 ENCOUNTER — Other Ambulatory Visit: Payer: Self-pay | Admitting: *Deleted

## 2015-03-16 DIAGNOSIS — I350 Nonrheumatic aortic (valve) stenosis: Secondary | ICD-10-CM

## 2015-03-16 MED ORDER — LORAZEPAM 1 MG PO TABS: 1.0000 mg | ORAL_TABLET | Freq: Two times a day (BID) | ORAL | Status: DC | PRN

## 2015-03-16 MED FILL — Nitroglycerin IV Soln 100 MCG/ML in D5W: INTRA_ARTERIAL | Qty: 10 | Status: AC

## 2015-03-17 ENCOUNTER — Encounter (HOSPITAL_BASED_OUTPATIENT_CLINIC_OR_DEPARTMENT_OTHER): Payer: Medicare Other | Attending: Surgery

## 2015-03-17 ENCOUNTER — Telehealth: Payer: Self-pay | Admitting: Pulmonary Disease

## 2015-03-17 DIAGNOSIS — L97811 Non-pressure chronic ulcer of other part of right lower leg limited to breakdown of skin: Secondary | ICD-10-CM | POA: Diagnosis not present

## 2015-03-17 DIAGNOSIS — I509 Heart failure, unspecified: Secondary | ICD-10-CM | POA: Diagnosis not present

## 2015-03-17 DIAGNOSIS — E11622 Type 2 diabetes mellitus with other skin ulcer: Secondary | ICD-10-CM | POA: Insufficient documentation

## 2015-03-17 DIAGNOSIS — I10 Essential (primary) hypertension: Secondary | ICD-10-CM | POA: Diagnosis not present

## 2015-03-17 DIAGNOSIS — Z79899 Other long term (current) drug therapy: Secondary | ICD-10-CM | POA: Insufficient documentation

## 2015-03-17 DIAGNOSIS — I252 Old myocardial infarction: Secondary | ICD-10-CM | POA: Insufficient documentation

## 2015-03-17 DIAGNOSIS — E1151 Type 2 diabetes mellitus with diabetic peripheral angiopathy without gangrene: Secondary | ICD-10-CM | POA: Diagnosis not present

## 2015-03-17 DIAGNOSIS — I2510 Atherosclerotic heart disease of native coronary artery without angina pectoris: Secondary | ICD-10-CM | POA: Insufficient documentation

## 2015-03-17 DIAGNOSIS — M109 Gout, unspecified: Secondary | ICD-10-CM | POA: Insufficient documentation

## 2015-03-17 DIAGNOSIS — Z8673 Personal history of transient ischemic attack (TIA), and cerebral infarction without residual deficits: Secondary | ICD-10-CM | POA: Diagnosis not present

## 2015-03-17 DIAGNOSIS — E11621 Type 2 diabetes mellitus with foot ulcer: Secondary | ICD-10-CM | POA: Diagnosis present

## 2015-03-17 DIAGNOSIS — L97511 Non-pressure chronic ulcer of other part of right foot limited to breakdown of skin: Secondary | ICD-10-CM | POA: Diagnosis not present

## 2015-03-17 NOTE — Telephone Encounter (Signed)
Auto CPAP 12/11/14 to 03/10/15 >> used on 44 of 90 nights with average 4 hrs and 38 min.  Average AHI is 3 with median CPAP 8 cm H2O and 95 th percentile CPAP 11 cm H20.  ONO with CPAP and RA 03/11/15 >> test time 6 hrs 31 min.  Mean SpO2 91%, low SpO2 69% (likely artifact).  Spent 20 minutes with SpO2 < 88%.  Will have my nurse inform pt that CPAP report shows good control of his sleep apnea.  His oxygen test shows good control of his oxygen while asleep with CPAP.  He needs to use CPAP whenever he is asleep to get maximal benefit from therapy.

## 2015-03-18 ENCOUNTER — Telehealth: Payer: Self-pay | Admitting: Pulmonary Disease

## 2015-03-18 DIAGNOSIS — L89320 Pressure ulcer of left buttock, unstageable: Secondary | ICD-10-CM

## 2015-03-18 NOTE — Telephone Encounter (Signed)
ATC x 1  Rang several times, NA wcb

## 2015-03-18 NOTE — Telephone Encounter (Signed)
Called and spoke with Jasmine December at [facility]  - ONO has already been completed and patient has been made aware of results.  Patient does not need O2 - per Jasmine December that's all she needed to know and will file the patient's chart away. Nothing further needed.   Coralyn Helling, MD at 03/17/2015 11:42 AM     Status: Signed       Expand All Collapse All   Auto CPAP 12/11/14 to 03/10/15 >> used on 44 of 90 nights with average 4 hrs and 38 min. Average AHI is 3 with median CPAP 8 cm H2O and 95 th percentile CPAP 11 cm H20.  ONO with CPAP and RA 03/11/15 >> test time 6 hrs 31 min. Mean SpO2 91%, low SpO2 69% (likely artifact). Spent 20 minutes with SpO2 < 88%.  Will have my nurse inform pt that CPAP report shows good control of his sleep apnea. His oxygen test shows good control of his oxygen while asleep with CPAP. He needs to use CPAP whenever he is asleep to get maximal benefit from therapy.

## 2015-03-22 ENCOUNTER — Ambulatory Visit (INDEPENDENT_AMBULATORY_CARE_PROVIDER_SITE_OTHER): Payer: Medicare Other | Admitting: Infectious Diseases

## 2015-03-22 ENCOUNTER — Encounter: Payer: Self-pay | Admitting: Infectious Diseases

## 2015-03-22 VITALS — BP 146/65 | HR 79 | Temp 97.7°F | Ht 70.0 in | Wt 239.0 lb

## 2015-03-22 DIAGNOSIS — I6523 Occlusion and stenosis of bilateral carotid arteries: Secondary | ICD-10-CM | POA: Diagnosis not present

## 2015-03-22 DIAGNOSIS — R7881 Bacteremia: Secondary | ICD-10-CM | POA: Diagnosis not present

## 2015-03-22 DIAGNOSIS — B957 Other staphylococcus as the cause of diseases classified elsewhere: Secondary | ICD-10-CM

## 2015-03-22 DIAGNOSIS — Z23 Encounter for immunization: Secondary | ICD-10-CM

## 2015-03-22 NOTE — Assessment & Plan Note (Addendum)
He appears to be doing well.  He does not have clinical evidence of endocarditis- could consider his worsening SOB but could also be his natural his for AS/CHF.  Will plan for his TAVR.  Will recheck his BCx, not restart his abx at this point.  Call him if any positives.

## 2015-03-22 NOTE — Progress Notes (Signed)
   Subjective:    Patient ID: [name], male    DOB: February 15, 1933, 81 y.o.   MRN: [id number]  HPI 81 yo M with hx of AS (mod-severe), DM, CKD/ESRD, CAD stent 9-11, staph pericarditis 2007. Adm on 6-18 with worsening SOB, DOE as well as abd pain. He had angiogram day pta. In hospital he became confused and agitated and required inbx on 6-19 and neo drip. He was started on vanco/zosyn at that time for concern over HCAP. His course was further complicated by + troponin, up to 17 (started on heparin 6-19).  He began to have fever on 6-29, 102.7. He was restarted vanco/zosyn 6-29. His BCx have grown 2/2 MRSE. He did not have TEE due risk of respiratory decompensation. He did have TTE which did not show IE.  He was d/c to SNF on 7-7 and received 1 month of vanco. He has been felt to have worsening AS and has been eval by CV for TAVR. He was noted at his CV f/u earlier this month to have increasing valvular gradient.  SOB is worse with exertion but has all the time.  He has not been re-adm.  He denies f/c.    Review of Systems  Constitutional: Negative for fever and chills.  Respiratory: Positive for shortness of breath. Negative for cough.   Cardiovascular: Negative for chest pain and leg swelling.  Gastrointestinal: Negative for diarrhea and constipation.  Genitourinary: Negative for difficulty urinating.  Neurological: Negative for dizziness and light-headedness.       Objective:   Physical Exam  Constitutional: He appears well-developed and well-nourished.  HENT:  Mouth/Throat: No oropharyngeal exudate.  Eyes: EOM are normal.  Pupils unequal, previous L eye surgery  Neck: Neck supple.  Cardiovascular: Normal rate, regular rhythm and normal heart sounds.   Pulmonary/Chest: Effort normal and breath sounds normal.  Abdominal: Soft. Bowel sounds are normal. He exhibits distension. There is no tenderness.  Musculoskeletal: He exhibits edema.       Legs: 3+ edema  Lymphadenopathy:      He has no cervical adenopathy.       Assessment & Plan:

## 2015-03-23 ENCOUNTER — Other Ambulatory Visit: Payer: Self-pay | Admitting: Internal Medicine

## 2015-03-24 ENCOUNTER — Encounter (HOSPITAL_COMMUNITY): Payer: Self-pay

## 2015-03-24 ENCOUNTER — Ambulatory Visit (HOSPITAL_COMMUNITY)
Admission: RE | Admit: 2015-03-24 | Discharge: 2015-03-24 | Disposition: A | Discharge status: Home / Self Care | Payer: Medicare Other | Source: Ambulatory Visit | Attending: Cardiovascular Disease | Admitting: Cardiovascular Disease

## 2015-03-24 DIAGNOSIS — I5033 Acute on chronic diastolic (congestive) heart failure: Secondary | ICD-10-CM | POA: Diagnosis present

## 2015-03-24 DIAGNOSIS — I872 Venous insufficiency (chronic) (peripheral): Secondary | ICD-10-CM | POA: Diagnosis present

## 2015-03-24 DIAGNOSIS — Z8249 Family history of ischemic heart disease and other diseases of the circulatory system: Secondary | ICD-10-CM

## 2015-03-24 DIAGNOSIS — J9621 Acute and chronic respiratory failure with hypoxia: Secondary | ICD-10-CM | POA: Diagnosis not present

## 2015-03-24 DIAGNOSIS — R590 Localized enlarged lymph nodes: Secondary | ICD-10-CM | POA: Insufficient documentation

## 2015-03-24 DIAGNOSIS — I130 Hypertensive heart and chronic kidney disease with heart failure and stage 1 through stage 4 chronic kidney disease, or unspecified chronic kidney disease: Secondary | ICD-10-CM | POA: Diagnosis present

## 2015-03-24 DIAGNOSIS — I350 Nonrheumatic aortic (valve) stenosis: Secondary | ICD-10-CM | POA: Diagnosis present

## 2015-03-24 DIAGNOSIS — Z7982 Long term (current) use of aspirin: Secondary | ICD-10-CM

## 2015-03-24 DIAGNOSIS — J8410 Pulmonary fibrosis, unspecified: Secondary | ICD-10-CM

## 2015-03-24 DIAGNOSIS — J9601 Acute respiratory failure with hypoxia: Secondary | ICD-10-CM | POA: Diagnosis not present

## 2015-03-24 DIAGNOSIS — E1122 Type 2 diabetes mellitus with diabetic chronic kidney disease: Secondary | ICD-10-CM | POA: Diagnosis present

## 2015-03-24 DIAGNOSIS — E1151 Type 2 diabetes mellitus with diabetic peripheral angiopathy without gangrene: Secondary | ICD-10-CM | POA: Diagnosis present

## 2015-03-24 DIAGNOSIS — I2510 Atherosclerotic heart disease of native coronary artery without angina pectoris: Secondary | ICD-10-CM | POA: Diagnosis present

## 2015-03-24 DIAGNOSIS — Z01818 Encounter for other preprocedural examination: Secondary | ICD-10-CM | POA: Insufficient documentation

## 2015-03-24 DIAGNOSIS — M4806 Spinal stenosis, lumbar region: Secondary | ICD-10-CM | POA: Diagnosis present

## 2015-03-24 DIAGNOSIS — Z9889 Other specified postprocedural states: Secondary | ICD-10-CM

## 2015-03-24 DIAGNOSIS — G2581 Restless legs syndrome: Secondary | ICD-10-CM | POA: Diagnosis present

## 2015-03-24 DIAGNOSIS — F329 Major depressive disorder, single episode, unspecified: Secondary | ICD-10-CM | POA: Diagnosis present

## 2015-03-24 DIAGNOSIS — Z955 Presence of coronary angioplasty implant and graft: Secondary | ICD-10-CM

## 2015-03-24 DIAGNOSIS — E669 Obesity, unspecified: Secondary | ICD-10-CM | POA: Diagnosis present

## 2015-03-24 DIAGNOSIS — Z888 Allergy status to other drugs, medicaments and biological substances status: Secondary | ICD-10-CM

## 2015-03-24 DIAGNOSIS — K219 Gastro-esophageal reflux disease without esophagitis: Secondary | ICD-10-CM | POA: Diagnosis present

## 2015-03-24 DIAGNOSIS — G4733 Obstructive sleep apnea (adult) (pediatric): Secondary | ICD-10-CM | POA: Diagnosis present

## 2015-03-24 DIAGNOSIS — I129 Hypertensive chronic kidney disease with stage 1 through stage 4 chronic kidney disease, or unspecified chronic kidney disease: Secondary | ICD-10-CM | POA: Diagnosis present

## 2015-03-24 DIAGNOSIS — M109 Gout, unspecified: Secondary | ICD-10-CM | POA: Diagnosis present

## 2015-03-24 DIAGNOSIS — Z6834 Body mass index (BMI) 34.0-34.9, adult: Secondary | ICD-10-CM

## 2015-03-24 DIAGNOSIS — Z79899 Other long term (current) drug therapy: Secondary | ICD-10-CM

## 2015-03-24 DIAGNOSIS — I6522 Occlusion and stenosis of left carotid artery: Secondary | ICD-10-CM | POA: Diagnosis present

## 2015-03-24 DIAGNOSIS — Z7902 Long term (current) use of antithrombotics/antiplatelets: Secondary | ICD-10-CM

## 2015-03-24 DIAGNOSIS — Z89021 Acquired absence of right finger(s): Secondary | ICD-10-CM

## 2015-03-24 DIAGNOSIS — Z8673 Personal history of transient ischemic attack (TIA), and cerebral infarction without residual deficits: Secondary | ICD-10-CM

## 2015-03-24 DIAGNOSIS — F419 Anxiety disorder, unspecified: Secondary | ICD-10-CM | POA: Diagnosis present

## 2015-03-24 DIAGNOSIS — Z833 Family history of diabetes mellitus: Secondary | ICD-10-CM

## 2015-03-24 DIAGNOSIS — M1990 Unspecified osteoarthritis, unspecified site: Secondary | ICD-10-CM | POA: Diagnosis present

## 2015-03-24 DIAGNOSIS — N183 Chronic kidney disease, stage 3 (moderate): Secondary | ICD-10-CM | POA: Diagnosis present

## 2015-03-24 DIAGNOSIS — N400 Enlarged prostate without lower urinary tract symptoms: Secondary | ICD-10-CM | POA: Diagnosis present

## 2015-03-24 MED ORDER — IOHEXOL 350 MG/ML SOLN
80.0000 mL | Freq: Once | INTRAVENOUS | Status: AC | PRN
  Administered 2015-03-24: 80 mL via INTRAVENOUS

## 2015-03-24 NOTE — Telephone Encounter (Signed)
Called pt and received MV--lmtcb x1

## 2015-03-25 ENCOUNTER — Encounter (HOSPITAL_BASED_OUTPATIENT_CLINIC_OR_DEPARTMENT_OTHER): Payer: Medicare Other | Attending: Internal Medicine

## 2015-03-25 DIAGNOSIS — L97511 Non-pressure chronic ulcer of other part of right foot limited to breakdown of skin: Secondary | ICD-10-CM | POA: Insufficient documentation

## 2015-03-25 DIAGNOSIS — M109 Gout, unspecified: Secondary | ICD-10-CM | POA: Diagnosis not present

## 2015-03-25 DIAGNOSIS — L97211 Non-pressure chronic ulcer of right calf limited to breakdown of skin: Secondary | ICD-10-CM | POA: Insufficient documentation

## 2015-03-25 DIAGNOSIS — I2510 Atherosclerotic heart disease of native coronary artery without angina pectoris: Secondary | ICD-10-CM | POA: Insufficient documentation

## 2015-03-25 DIAGNOSIS — I252 Old myocardial infarction: Secondary | ICD-10-CM | POA: Insufficient documentation

## 2015-03-25 DIAGNOSIS — G4730 Sleep apnea, unspecified: Secondary | ICD-10-CM | POA: Insufficient documentation

## 2015-03-25 DIAGNOSIS — E1151 Type 2 diabetes mellitus with diabetic peripheral angiopathy without gangrene: Secondary | ICD-10-CM | POA: Diagnosis not present

## 2015-03-25 DIAGNOSIS — I509 Heart failure, unspecified: Secondary | ICD-10-CM | POA: Insufficient documentation

## 2015-03-25 DIAGNOSIS — E11622 Type 2 diabetes mellitus with other skin ulcer: Secondary | ICD-10-CM | POA: Diagnosis not present

## 2015-03-25 DIAGNOSIS — I10 Essential (primary) hypertension: Secondary | ICD-10-CM | POA: Insufficient documentation

## 2015-03-25 DIAGNOSIS — E1140 Type 2 diabetes mellitus with diabetic neuropathy, unspecified: Secondary | ICD-10-CM | POA: Diagnosis not present

## 2015-03-26 ENCOUNTER — Inpatient Hospital Stay (HOSPITAL_COMMUNITY)
Admission: EM | Admit: 2015-03-26 | Discharge: 2015-03-27 | DRG: 189 | Disposition: A | Discharge status: Home Health | Payer: Medicare Other | Attending: Internal Medicine | Admitting: Internal Medicine

## 2015-03-26 ENCOUNTER — Emergency Department (HOSPITAL_COMMUNITY): Payer: Medicare Other

## 2015-03-26 ENCOUNTER — Encounter (HOSPITAL_COMMUNITY): Payer: Self-pay | Admitting: *Deleted

## 2015-03-26 DIAGNOSIS — Z7982 Long term (current) use of aspirin: Secondary | ICD-10-CM | POA: Diagnosis not present

## 2015-03-26 DIAGNOSIS — Z89021 Acquired absence of right finger(s): Secondary | ICD-10-CM | POA: Diagnosis not present

## 2015-03-26 DIAGNOSIS — Z955 Presence of coronary angioplasty implant and graft: Secondary | ICD-10-CM | POA: Diagnosis not present

## 2015-03-26 DIAGNOSIS — J9601 Acute respiratory failure with hypoxia: Secondary | ICD-10-CM | POA: Diagnosis present

## 2015-03-26 DIAGNOSIS — I6522 Occlusion and stenosis of left carotid artery: Secondary | ICD-10-CM | POA: Diagnosis present

## 2015-03-26 DIAGNOSIS — N400 Enlarged prostate without lower urinary tract symptoms: Secondary | ICD-10-CM | POA: Diagnosis present

## 2015-03-26 DIAGNOSIS — E669 Obesity, unspecified: Secondary | ICD-10-CM | POA: Diagnosis present

## 2015-03-26 DIAGNOSIS — J9621 Acute and chronic respiratory failure with hypoxia: Secondary | ICD-10-CM | POA: Diagnosis present

## 2015-03-26 DIAGNOSIS — I5033 Acute on chronic diastolic (congestive) heart failure: Secondary | ICD-10-CM | POA: Diagnosis present

## 2015-03-26 DIAGNOSIS — F329 Major depressive disorder, single episode, unspecified: Secondary | ICD-10-CM | POA: Diagnosis present

## 2015-03-26 DIAGNOSIS — I350 Nonrheumatic aortic (valve) stenosis: Secondary | ICD-10-CM | POA: Diagnosis present

## 2015-03-26 DIAGNOSIS — Z9889 Other specified postprocedural states: Secondary | ICD-10-CM | POA: Diagnosis not present

## 2015-03-26 DIAGNOSIS — M109 Gout, unspecified: Secondary | ICD-10-CM | POA: Diagnosis present

## 2015-03-26 DIAGNOSIS — N183 Chronic kidney disease, stage 3 unspecified: Secondary | ICD-10-CM | POA: Diagnosis present

## 2015-03-26 DIAGNOSIS — Z7902 Long term (current) use of antithrombotics/antiplatelets: Secondary | ICD-10-CM | POA: Diagnosis not present

## 2015-03-26 DIAGNOSIS — M4806 Spinal stenosis, lumbar region: Secondary | ICD-10-CM | POA: Diagnosis present

## 2015-03-26 DIAGNOSIS — Z79899 Other long term (current) drug therapy: Secondary | ICD-10-CM | POA: Diagnosis not present

## 2015-03-26 DIAGNOSIS — I25118 Atherosclerotic heart disease of native coronary artery with other forms of angina pectoris: Secondary | ICD-10-CM

## 2015-03-26 DIAGNOSIS — E1151 Type 2 diabetes mellitus with diabetic peripheral angiopathy without gangrene: Secondary | ICD-10-CM | POA: Diagnosis present

## 2015-03-26 DIAGNOSIS — Z6834 Body mass index (BMI) 34.0-34.9, adult: Secondary | ICD-10-CM | POA: Diagnosis not present

## 2015-03-26 DIAGNOSIS — I2510 Atherosclerotic heart disease of native coronary artery without angina pectoris: Secondary | ICD-10-CM | POA: Diagnosis present

## 2015-03-26 DIAGNOSIS — M1990 Unspecified osteoarthritis, unspecified site: Secondary | ICD-10-CM | POA: Diagnosis present

## 2015-03-26 DIAGNOSIS — J9600 Acute respiratory failure, unspecified whether with hypoxia or hypercapnia: Secondary | ICD-10-CM | POA: Diagnosis present

## 2015-03-26 DIAGNOSIS — G2581 Restless legs syndrome: Secondary | ICD-10-CM | POA: Diagnosis present

## 2015-03-26 DIAGNOSIS — I130 Hypertensive heart and chronic kidney disease with heart failure and stage 1 through stage 4 chronic kidney disease, or unspecified chronic kidney disease: Secondary | ICD-10-CM | POA: Diagnosis present

## 2015-03-26 DIAGNOSIS — J189 Pneumonia, unspecified organism: Secondary | ICD-10-CM

## 2015-03-26 DIAGNOSIS — I129 Hypertensive chronic kidney disease with stage 1 through stage 4 chronic kidney disease, or unspecified chronic kidney disease: Secondary | ICD-10-CM | POA: Diagnosis present

## 2015-03-26 DIAGNOSIS — I872 Venous insufficiency (chronic) (peripheral): Secondary | ICD-10-CM | POA: Diagnosis present

## 2015-03-26 DIAGNOSIS — E662 Morbid (severe) obesity with alveolar hypoventilation: Secondary | ICD-10-CM

## 2015-03-26 DIAGNOSIS — Z833 Family history of diabetes mellitus: Secondary | ICD-10-CM | POA: Diagnosis not present

## 2015-03-26 DIAGNOSIS — G4733 Obstructive sleep apnea (adult) (pediatric): Secondary | ICD-10-CM | POA: Diagnosis not present

## 2015-03-26 DIAGNOSIS — F419 Anxiety disorder, unspecified: Secondary | ICD-10-CM | POA: Diagnosis present

## 2015-03-26 DIAGNOSIS — K219 Gastro-esophageal reflux disease without esophagitis: Secondary | ICD-10-CM | POA: Diagnosis present

## 2015-03-26 DIAGNOSIS — Z888 Allergy status to other drugs, medicaments and biological substances status: Secondary | ICD-10-CM | POA: Diagnosis not present

## 2015-03-26 DIAGNOSIS — I359 Nonrheumatic aortic valve disorder, unspecified: Secondary | ICD-10-CM | POA: Diagnosis present

## 2015-03-26 DIAGNOSIS — E1122 Type 2 diabetes mellitus with diabetic chronic kidney disease: Secondary | ICD-10-CM | POA: Diagnosis present

## 2015-03-26 DIAGNOSIS — Z8249 Family history of ischemic heart disease and other diseases of the circulatory system: Secondary | ICD-10-CM | POA: Diagnosis not present

## 2015-03-26 DIAGNOSIS — Z8673 Personal history of transient ischemic attack (TIA), and cerebral infarction without residual deficits: Secondary | ICD-10-CM | POA: Diagnosis not present

## 2015-03-26 LAB — CBC WITH DIFFERENTIAL/PLATELET
BASOS ABS: 0 10*3/uL (ref 0.0–0.1)
BASOS PCT: 1 % (ref 0–1)
Basophils Absolute: 0 10*3/uL (ref 0.0–0.1)
Basophils Relative: 1 % (ref 0–1)
EOS ABS: 0.2 10*3/uL (ref 0.0–0.7)
Eosinophils Absolute: 0.2 10*3/uL (ref 0.0–0.7)
Eosinophils Relative: 4 % (ref 0–5)
Eosinophils Relative: 3 % (ref 0–5)
HEMATOCRIT: 42.1 % (ref 39.0–52.0)
HEMATOCRIT: 40.3 % (ref 39.0–52.0)
HEMOGLOBIN: 13.4 g/dL (ref 13.0–17.0)
Hemoglobin: 12.9 g/dL — ABNORMAL LOW (ref 13.0–17.0)
LYMPHS ABS: 1.7 10*3/uL (ref 0.7–4.0)
LYMPHS PCT: 17 % (ref 12–46)
Lymphocytes Relative: 29 % (ref 12–46)
Lymphs Abs: 1.1 10*3/uL (ref 0.7–4.0)
MCH: 29.6 pg (ref 26.0–34.0)
MCH: 29.8 pg (ref 26.0–34.0)
MCHC: 31.8 g/dL (ref 30.0–36.0)
MCHC: 32 g/dL (ref 30.0–36.0)
MCV: 92.9 fL (ref 78.0–100.0)
MCV: 93.1 fL (ref 78.0–100.0)
MONO ABS: 0.6 10*3/uL (ref 0.1–1.0)
MONOS PCT: 11 % (ref 3–12)
Monocytes Absolute: 0.6 10*3/uL (ref 0.1–1.0)
Monocytes Relative: 9 % (ref 3–12)
NEUTROS ABS: 3.2 10*3/uL (ref 1.7–7.7)
NEUTROS ABS: 4.9 10*3/uL (ref 1.7–7.7)
NEUTROS PCT: 55 % (ref 43–77)
NEUTROS PCT: 72 % (ref 43–77)
Platelets: 125 10*3/uL — ABNORMAL LOW (ref 150–400)
Platelets: 121 10*3/uL — ABNORMAL LOW (ref 150–400)
RBC: 4.53 MIL/uL (ref 4.22–5.81)
RBC: 4.33 MIL/uL (ref 4.22–5.81)
RDW: 16.5 % — ABNORMAL HIGH (ref 11.5–15.5)
RDW: 16.4 % — AB (ref 11.5–15.5)
WBC: 5.8 10*3/uL (ref 4.0–10.5)
WBC: 6.8 10*3/uL (ref 4.0–10.5)

## 2015-03-26 LAB — COMPREHENSIVE METABOLIC PANEL
ALBUMIN: 3.5 g/dL (ref 3.5–5.0)
ALK PHOS: 73 U/L (ref 38–126)
ALT: 20 U/L (ref 17–63)
ANION GAP: 8 (ref 5–15)
AST: 30 U/L (ref 15–41)
BILIRUBIN TOTAL: 0.9 mg/dL (ref 0.3–1.2)
BUN: 11 mg/dL (ref 6–20)
CALCIUM: 8.8 mg/dL — AB (ref 8.9–10.3)
CO2: 27 mmol/L (ref 22–32)
Chloride: 104 mmol/L (ref 101–111)
Creatinine, Ser: 1.32 mg/dL — ABNORMAL HIGH (ref 0.61–1.24)
GFR, EST AFRICAN AMERICAN: 57 mL/min — AB (ref >60)
GFR, EST NON AFRICAN AMERICAN: 49 mL/min — AB (ref >60)
GLUCOSE: 111 mg/dL — AB (ref 65–99)
POTASSIUM: 3.7 mmol/L (ref 3.5–5.1)
Sodium: 139 mmol/L (ref 135–145)
TOTAL PROTEIN: 6.7 g/dL (ref 6.5–8.1)

## 2015-03-26 LAB — BASIC METABOLIC PANEL
ANION GAP: 8 (ref 5–15)
BUN: 13 mg/dL (ref 6–20)
CHLORIDE: 104 mmol/L (ref 101–111)
CO2: 27 mmol/L (ref 22–32)
CREATININE: 1.4 mg/dL — AB (ref 0.61–1.24)
Calcium: 9.1 mg/dL (ref 8.9–10.3)
GFR calc non Af Amer: 46 mL/min — ABNORMAL LOW (ref >60)
GFR, EST AFRICAN AMERICAN: 53 mL/min — AB (ref >60)
Glucose, Bld: 98 mg/dL (ref 65–99)
POTASSIUM: 3.9 mmol/L (ref 3.5–5.1)
SODIUM: 139 mmol/L (ref 135–145)

## 2015-03-26 LAB — PROCALCITONIN: Procalcitonin: <0.1 ng/mL

## 2015-03-26 LAB — GLUCOSE, CAPILLARY
GLUCOSE-CAPILLARY: 116 mg/dL — AB (ref 65–99)
GLUCOSE-CAPILLARY: 146 mg/dL — AB (ref 65–99)
Glucose-Capillary: 110 mg/dL — ABNORMAL HIGH (ref 65–99)

## 2015-03-26 LAB — I-STAT TROPONIN, ED: Troponin i, poc: 0.02 ng/mL (ref 0.00–0.08)

## 2015-03-26 LAB — TROPONIN I

## 2015-03-26 LAB — CBG MONITORING, ED: GLUCOSE-CAPILLARY: 86 mg/dL (ref 65–99)

## 2015-03-26 LAB — BRAIN NATRIURETIC PEPTIDE: B NATRIURETIC PEPTIDE 5: 149.3 pg/mL — AB (ref 0.0–100.0)

## 2015-03-26 LAB — D-DIMER, QUANTITATIVE (NOT AT ARMC): D DIMER QUANT: 0.57 ug{FEU}/mL — AB (ref 0.00–0.48)

## 2015-03-26 MED ORDER — PREGABALIN 100 MG PO CAPS
200.0000 mg | ORAL_CAPSULE | Freq: Every day | ORAL | Status: DC
  Administered 2015-03-26: 200 mg via ORAL
  Filled 2015-03-26: qty 2

## 2015-03-26 MED ORDER — AMLODIPINE BESYLATE 5 MG PO TABS: 5.0000 mg | ORAL_TABLET | Freq: Every day | ORAL | Status: DC | PRN

## 2015-03-26 MED ORDER — OXYCODONE HCL 5 MG PO TABS: 5.0000 mg | ORAL_TABLET | Freq: Four times a day (QID) | ORAL | Status: DC | PRN

## 2015-03-26 MED ORDER — ENOXAPARIN SODIUM 40 MG/0.4ML ~~LOC~~ SOLN
40.0000 mg | Freq: Every day | SUBCUTANEOUS | Status: DC
  Administered 2015-03-26 – 2015-03-27 (×2): 40 mg via SUBCUTANEOUS
  Filled 2015-03-26 (×2): qty 0.4

## 2015-03-26 MED ORDER — ACETAMINOPHEN 650 MG RE SUPP
650.0000 mg | Freq: Four times a day (QID) | RECTAL | Status: DC | PRN
Start: 2015-03-26 — End: 2015-03-27

## 2015-03-26 MED ORDER — PIPERACILLIN-TAZOBACTAM 3.375 G IVPB
3.3750 g | Freq: Three times a day (TID) | INTRAVENOUS | Status: DC
  Administered 2015-03-26: 3.375 g via INTRAVENOUS
  Filled 2015-03-26 (×3): qty 50

## 2015-03-26 MED ORDER — FUROSEMIDE 40 MG PO TABS
40.0000 mg | ORAL_TABLET | Freq: Every day | ORAL | Status: DC
  Administered 2015-03-26 – 2015-03-27 (×2): 40 mg via ORAL
  Filled 2015-03-26 (×2): qty 1

## 2015-03-26 MED ORDER — LORAZEPAM 1 MG PO TABS: 1.0000 mg | ORAL_TABLET | Freq: Three times a day (TID) | ORAL | Status: DC | PRN

## 2015-03-26 MED ORDER — DOCUSATE SODIUM 100 MG PO CAPS
100.0000 mg | ORAL_CAPSULE | Freq: Two times a day (BID) | ORAL | Status: DC
  Administered 2015-03-26 – 2015-03-27 (×3): 100 mg via ORAL
  Filled 2015-03-26 (×4): qty 1

## 2015-03-26 MED ORDER — ALLOPURINOL 300 MG PO TABS
300.0000 mg | ORAL_TABLET | Freq: Every morning | ORAL | Status: DC
  Administered 2015-03-26 – 2015-03-27 (×2): 300 mg via ORAL
  Filled 2015-03-26 (×2): qty 1

## 2015-03-26 MED ORDER — MAGNESIUM OXIDE 400 (241.3 MG) MG PO TABS
400.0000 mg | ORAL_TABLET | Freq: Every day | ORAL | Status: DC
  Administered 2015-03-26 – 2015-03-27 (×2): 400 mg via ORAL
  Filled 2015-03-26 (×2): qty 1

## 2015-03-26 MED ORDER — ONDANSETRON HCL 4 MG/2ML IJ SOLN: 4.0000 mg | Freq: Four times a day (QID) | INTRAMUSCULAR | Status: DC | PRN

## 2015-03-26 MED ORDER — ALBUTEROL SULFATE (2.5 MG/3ML) 0.083% IN NEBU: 5.0000 mg | INHALATION_SOLUTION | Freq: Once | RESPIRATORY_TRACT | Status: DC

## 2015-03-26 MED ORDER — VANCOMYCIN HCL IN DEXTROSE 1-5 GM/200ML-% IV SOLN
1000.0000 mg | Freq: Once | INTRAVENOUS | Status: AC
  Administered 2015-03-26: 1000 mg via INTRAVENOUS
  Filled 2015-03-26: qty 200

## 2015-03-26 MED ORDER — TAMSULOSIN HCL 0.4 MG PO CAPS
0.4000 mg | ORAL_CAPSULE | Freq: Every day | ORAL | Status: DC
  Administered 2015-03-26 – 2015-03-27 (×2): 0.4 mg via ORAL
  Filled 2015-03-26 (×2): qty 1

## 2015-03-26 MED ORDER — ROPINIROLE HCL 1 MG PO TABS
4.0000 mg | ORAL_TABLET | Freq: Every day | ORAL | Status: DC
  Administered 2015-03-26: 4 mg via ORAL
  Filled 2015-03-26: qty 4

## 2015-03-26 MED ORDER — FERROUS GLUCONATE 324 (38 FE) MG PO TABS
324.0000 mg | ORAL_TABLET | Freq: Every day | ORAL | Status: DC
  Administered 2015-03-26 – 2015-03-27 (×2): 324 mg via ORAL
  Filled 2015-03-26 (×2): qty 1

## 2015-03-26 MED ORDER — VANCOMYCIN HCL 10 G IV SOLR
1250.0000 mg | INTRAVENOUS | Status: DC
  Filled 2015-03-26: qty 1250

## 2015-03-26 MED ORDER — BUPROPION HCL ER (XL) 150 MG PO TB24
150.0000 mg | ORAL_TABLET | Freq: Every morning | ORAL | Status: DC
  Administered 2015-03-26 – 2015-03-27 (×2): 150 mg via ORAL
  Filled 2015-03-26 (×2): qty 1

## 2015-03-26 MED ORDER — PANTOPRAZOLE SODIUM 40 MG PO TBEC
40.0000 mg | DELAYED_RELEASE_TABLET | Freq: Two times a day (BID) | ORAL | Status: DC
  Administered 2015-03-26 – 2015-03-27 (×3): 40 mg via ORAL
  Filled 2015-03-26 (×3): qty 1

## 2015-03-26 MED ORDER — ASPIRIN EC 81 MG PO TBEC
81.0000 mg | DELAYED_RELEASE_TABLET | Freq: Every day | ORAL | Status: DC
  Administered 2015-03-26 – 2015-03-27 (×2): 81 mg via ORAL
  Filled 2015-03-26 (×2): qty 1

## 2015-03-26 MED ORDER — SODIUM CHLORIDE 0.9 % IJ SOLN
3.0000 mL | Freq: Two times a day (BID) | INTRAMUSCULAR | Status: DC
  Administered 2015-03-26 – 2015-03-27 (×3): 3 mL via INTRAVENOUS

## 2015-03-26 MED ORDER — CLOPIDOGREL BISULFATE 75 MG PO TABS
75.0000 mg | ORAL_TABLET | Freq: Every day | ORAL | Status: DC
  Administered 2015-03-26 – 2015-03-27 (×2): 75 mg via ORAL
  Filled 2015-03-26 (×2): qty 1

## 2015-03-26 MED ORDER — ONDANSETRON HCL 4 MG PO TABS: 4.0000 mg | ORAL_TABLET | Freq: Four times a day (QID) | ORAL | Status: DC | PRN

## 2015-03-26 MED ORDER — ACETAMINOPHEN 325 MG PO TABS: 650.0000 mg | ORAL_TABLET | Freq: Four times a day (QID) | ORAL | Status: DC | PRN

## 2015-03-26 MED ORDER — PIPERACILLIN-TAZOBACTAM 3.375 G IVPB 30 MIN
3.3750 g | Freq: Once | INTRAVENOUS | Status: AC
  Administered 2015-03-26: 3.375 g via INTRAVENOUS
  Filled 2015-03-26: qty 50

## 2015-03-26 MED ORDER — METOPROLOL SUCCINATE ER 50 MG PO TB24
50.0000 mg | ORAL_TABLET | Freq: Every day | ORAL | Status: DC
  Administered 2015-03-26 – 2015-03-27 (×2): 50 mg via ORAL
  Filled 2015-03-26 (×2): qty 1

## 2015-03-26 MED ORDER — MIRABEGRON ER 50 MG PO TB24
50.0000 mg | ORAL_TABLET | Freq: Every day | ORAL | Status: DC
  Administered 2015-03-26 – 2015-03-27 (×2): 50 mg via ORAL
  Filled 2015-03-26 (×3): qty 1

## 2015-03-26 MED ORDER — MIRTAZAPINE 15 MG PO TABS
15.0000 mg | ORAL_TABLET | Freq: Every day | ORAL | Status: DC
  Administered 2015-03-26: 15 mg via ORAL
  Filled 2015-03-26: qty 1

## 2015-03-26 NOTE — ED Notes (Signed)
Pt c/o SOB. States that he cannot lay down without feeling SOB. Pt is in process of workup for an aortic valve replacement. Pt c/o abd pain.

## 2015-03-26 NOTE — Progress Notes (Signed)
Pt. Arrived to the unit via stretcher. Pt. Is alert and oriented with no signs of distress noted; placed on 2L of O2 [place]. Pt. Vitals appear stable with skin assessed with Nehemiah Settle, Charity fundraiser. Pt. [facility] and [facility]. Educated pt. On use of staff numbers, room telephone and call bell. Call light within reach. Orders released. No further needs noted at this time.

## 2015-03-26 NOTE — ED Notes (Signed)
Attempted to call report

## 2015-03-26 NOTE — ED Provider Notes (Signed)
CSN: [id number]     Arrival date & time 03/26/15  0227 History    This chart was scribed for Azalia Bilis, MD by Arlan Organ, ED Scribe. This patient was seen in room D36C/D36C and the patient's care was started 2:42 AM.   Chief Complaint  Patient presents with  . Shortness of Breath   The history is provided by the patient. No language interpreter was used.    HPI Comments: [name] is a 81 y.o. male with a PMHx of CAD, TIA, stroke, CHF, CKD, and aortic stenosis who presents to the Emergency Department complaining of constant, ongoing shortness of breath x 4 days. Pt states shortness of breath has been ongoing for several years but recently worsened in last few days. states he can not get adequate sleep at night as his shortness of breath is keeping him up at night time. He reports some tightness to his chest which he attributes to his shortness of breath. Breathing is made worse when laying down/with exertion and mildly alleviated when sitting up. No recent fever, chills, chest pain, or abdominal pain. Pt with known allergy to statins.  Past Medical History  Diagnosis Date  . Gout, unspecified     severe dz, "treatment done that last 15 years"  . Obesity (BMI 30-39.9)   . Restless leg syndrome   . History of retinal detachment 1994  . Hypertensive heart disease   . CAD (coronary artery disease), native coronary artery   . History of pericarditis 2007    MSSA, s/p pericardial window  . GERD   . Osteoarthritis   . Sleep apnea in adult     CPAP qhs  . TIA (transient ischemic attack) 2005  . BPH (benign prostatic hypertrophy)     "minor"  . Lumbar spinal stenosis   . Carotid artery occlusion   . Unspecified venous (peripheral) insufficiency   . Stroke May 2003  . Anxiety   . Depression   . Aortic stenosis     ECHO 05/29/14 moderate AS Mean gradient 21 mm EF 55% ECHO 07/09/13  Mean gradient 12 mm Mild to moderate AS EF 50%    . CAD (coronary artery disease)     02/11/09   Xience stent to circumflex 2.5 x 18 mm postdilated to 2.75 mm  Cath sept 2011  normal Left main, 20 % stenosis proximal LAD, 40% stenosis mid LAD, 70% stenosis proximal Diag 1, 90% stenosis prox CFX, 90% first OM, small and nondominant RCA;   . Carotid artery disease     Prior TIA 2006 with left CEA by Dr. Arbie Cookey Recurrent TIA in April 2011   . Type 2 diabetes mellitus with vascular disease   . Lumbar disc disease   . CKD (chronic kidney disease), stage III 02/08/2014  . CHF (congestive heart failure)    Past Surgical History  Procedure Laterality Date  . Pericardial window  2007  . Left arm  2008    shoulder  . Right arm  1970's    shoulder  . Cataract extraction      Left side x's 2 and right  . Retinal detachment surgery      left side  . Carotid endarterectomy Left 12-06-05    cea  . Coronary angioplasty  01/2009    x 1 stent  . Colonoscopy    . Carpal tunnel release Bilateral   . Back surgery  2013    removed bone spurs  . Inguinal hernia repair Left 10/01/2013  Procedure: HERNIA REPAIR INGUINAL ADULT;  Surgeon: Axel Filler, MD;  Location: [facility];  Service: General;  Laterality: Left;  . Insertion of mesh Left 10/01/2013    Procedure: INSERTION OF MESH;  Surgeon: Axel Filler, MD;  Location: [facility];  Service: General;  Laterality: Left;  . Finger amputation  May 2015    JUST TO FIRST JOINT RIGHT HAND  LAST FINGER ( Right 5th finger)  . Peripheral vascular catheterization N/A 01/08/2015    Procedure: Abdominal Aortogram;  Surgeon: Chuck Hint, MD;  Location: [facility] INVASIVE CV LAB;  Service: Cardiovascular;  Laterality: N/A;  . Cardiac catheterization N/A 03/15/2015    Procedure: Right/Left Heart Cath and Coronary Angiography;  Surgeon: Tonny Bollman, MD;  Location: [facility] INVASIVE CV LAB;  Service: Cardiovascular;  Laterality: N/A;   Family History  Problem Relation Age of Onset  . Heart disease Mother     Before age 60  . Heart attack Mother   . Diabetes Father    . Heart disease Father   . Breast cancer Sister   . Cancer Sister   . Prostate cancer Brother   . Lung cancer Brother   . Cancer Brother   . Diabetes Daughter   . Diabetes Son   . Hypertension Son    Social History  Substance Use Topics  . Smoking status: Never Smoker   . Smokeless tobacco: Never Used  . Alcohol Use: No    Review of Systems  Constitutional: Negative for fever and chills.  Respiratory: Positive for shortness of breath. Negative for cough.   Cardiovascular: Negative for chest pain.  Gastrointestinal: Negative for nausea, vomiting, abdominal pain and diarrhea.  Neurological: Negative for weakness and numbness.  Psychiatric/Behavioral: Negative for confusion.  All other systems reviewed and are negative.     Allergies  Statins  Home Medications   Prior to Admission medications   Medication Sig Start Date End Date Taking? Authorizing Provider  acetaminophen (TYLENOL) 325 MG tablet Take 2 tablets (650 mg total) by mouth every 6 (six) hours as needed for mild pain or moderate pain. Patient taking differently: Take 325 mg by mouth every 6 (six) hours as needed for mild pain or moderate pain.  07/21/14   Elease Etienne, MD  allopurinol (ZYLOPRIM) 300 MG tablet Take 1 tablet (300 mg total) by mouth every morning. 10/06/14   Newt Lukes, MD  amLODipine (NORVASC) 5 MG tablet Take 5 mg by mouth daily as needed (Pt states he only takes if Heart Rate is high).    Historical Provider, MD  aspirin EC 81 MG tablet Take 81 mg by mouth daily.    Historical Provider, MD  buPROPion (WELLBUTRIN XL) 150 MG 24 hr tablet Take 1 tablet (150 mg total) by mouth every morning. 10/06/14   Newt Lukes, MD  cholecalciferol (VITAMIN D) 1000 UNITS tablet Take 1,000 Units by mouth daily.    Historical Provider, MD  clopidogrel (PLAVIX) 75 MG tablet Take 1 tablet (75 mg total) by mouth daily. 11/11/14   Newt Lukes, MD  docusate sodium 100 MG CAPS Take 100 mg by mouth 2  (two) times daily. 07/16/14   Ripudeep Jenna Luo, MD  Ferrous Gluconate 256 (28 FE) MG TABS Take 256 mg by mouth daily.    Historical Provider, MD  furosemide (LASIX) 40 MG tablet Take 1 tablet (40 mg total) by mouth daily. 07/21/14   Elease Etienne, MD  LORazepam (ATIVAN) 1 MG tablet TAKE ONE TABLET BY MOUTH  EVERY 8 HOURS AS NEEDED FOR ANXIETY/SLEEP 03/23/15   Etta Grandchild, MD  Magnesium 250 MG TABS Take 250 mg by mouth daily.     Historical Provider, MD  metoprolol succinate (TOPROL-XL) 50 MG 24 hr tablet Take 1 tablet (50 mg total) by mouth daily. Take with or immediately following a meal. 11/11/14   Newt Lukes, MD  mirabegron ER (MYRBETRIQ) 50 MG TB24 tablet Take 1 tablet (50 mg total) by mouth daily. 10/06/14   Newt Lukes, MD  mirabegron ER (MYRBETRIQ) 50 MG TB24 tablet Take 50 mg by mouth daily.    Historical Provider, MD  mirtazapine (REMERON) 15 MG tablet Take 1 tablet (15 mg total) by mouth at bedtime. 09/10/14   Newt Lukes, MD  pantoprazole (PROTONIX) 40 MG tablet Take 1 tablet (40 mg total) by mouth 2 (two) times daily. 10/06/14   Newt Lukes, MD  pregabalin (LYRICA) 200 MG capsule Take 1 capsule (200 mg total) by mouth at bedtime. 12/15/14   Newt Lukes, MD  rOPINIRole (REQUIP) 4 MG tablet Take 1 tablet (4 mg total) by mouth at bedtime. 10/06/14   Newt Lukes, MD  tamsulosin (FLOMAX) 0.4 MG CAPS capsule Take 1 capsule (0.4 mg total) by mouth daily. 11/11/14   Newt Lukes, MD   Triage Vitals: There were no vitals taken for this visit.   Physical Exam  Constitutional: He is oriented to person, place, and time. He appears well-developed and well-nourished.  HENT:  Head: Normocephalic and atraumatic.  Eyes: EOM are normal.  Neck: Normal range of motion.  Cardiovascular: Normal rate, regular rhythm, normal heart sounds and intact distal pulses.   Pulmonary/Chest: No respiratory distress. He has no wheezes.  Decreased breath sounds in the  bases  Abdominal: Soft. He exhibits no distension. There is no tenderness.  Musculoskeletal: Normal range of motion.  Neurological: He is alert and oriented to person, place, and time.  Skin: Skin is warm and dry.  Psychiatric: He has a normal mood and affect. Judgment normal.  Nursing note and vitals reviewed.   ED Course  Procedures (including critical care time)  DIAGNOSTIC STUDIES:  COORDINATION OF CARE: 2:48 AM- Will order CXR, Troponin I, BNP, CBC, i-stat troponin, and EKG. Discussed treatment plan with pt at bedside and pt agreed to plan.     Labs Review Labs Reviewed  BASIC METABOLIC PANEL - Abnormal; Notable for the following:    Creatinine, Ser 1.40 (*)    GFR calc non Af Amer 46 (*)    GFR calc Af Amer 53 (*)    All other components within normal limits  CBC WITH DIFFERENTIAL/PLATELET - Abnormal; Notable for the following:    RDW 16.5 (*)    Platelets 125 (*)    All other components within normal limits  BRAIN NATRIURETIC PEPTIDE - Abnormal; Notable for the following:    B Natriuretic Peptide 149.3 (*)    All other components within normal limits  TROPONIN I  I-STAT TROPOININ, ED    Imaging Review Dg Chest 2 View  03/26/2015   CLINICAL DATA:  Acute onset of shortness of breath. Initial encounter.  EXAM: CHEST  2 VIEW  COMPARISON:  Chest radiograph performed 01/24/2015, and CTA of the chest performed 03/24/2015  FINDINGS: The lungs are well-aerated. Mild central bibasilar airspace opacities likely reflect atelectasis, though mild pneumonia might have a similar appearance. Pulmonary vascularity is at the upper limits of normal. There is no evidence of pleural effusion  or pneumothorax.  The heart is borderline normal in size. No acute osseous abnormalities are seen.  IMPRESSION: Mild central bibasilar airspace opacities likely reflect atelectasis, though mild pneumonia might have a similar appearance.   Electronically Signed   By: Roanna Raider M.D.   On: 03/26/2015 03:19    Ct Coronary Morp W/cta Cor W/score W/ca W/cm &/or Wo/cm  03/24/2015   ADDENDUM REPORT: 03/24/2015 12:18  CLINICAL DATA:  Aortic stenosis  EXAM: Cardiac TAVR CT  TECHNIQUE: The patient was scanned on a Philips 256 scanner. A 120 kV retrospective scan was triggered in the descending thoracic aorta at 111 HU's. Gantry rotation speed was 270 msecs and collimation was .9 mm. No beta blockade or nitro were given. The 3D data set was reconstructed in 5% intervals of the R-R cycle. Systolic and diastolic phases were analyzed on a dedicated work station using MPR, MIP and VRT modes. The patient received 80 cc of contrast.  FINDINGS: Aortic Valve:  Trileaflet and heavily calcified  Aorta: Normal arch vessel take off no coarctation No contraindications to stented valve delivery  Sinotubular Junction:  27 mm  Ascending Thoracic Aorta:  32 mm  Sinus of Valsalva Measurements:  Non-coronary:  29.2  Right coronary: 28  Left coronary: 29.5  Coronary Artery Height above Annulus:  Left Main:  13 mm  Right Coronary:  13.5 mm  Virtual Basal Annulus Measurements:  Maximum/Minimum Diameter:  25.8 x 20.5  Area:  416 mm2  Coronary Arteries:  Adequate height above annulus for deployment  Optimum Fluoroscopic Angle for Delivery: LAO 10 degrees Cranial 1 degree  IMPRESSION: 1) Calcified trileaflet aortic valve suitable for under inflated 26 mm Sapien 3 valve  2) Suitable coronary artery height for deployment  3) Nodular annular calcification at base of non coronary cusp and dense subannular calcification along the anterior mitral leaflet may increase risk of para valvular regurgitation  4) Optimum angiographic angle for deployment LAO 10 degrees Cranial 1 degree  Charlton Haws   Electronically Signed   By: Charlton Haws M.D.   On: 03/24/2015 12:18   03/24/2015   EXAM: OVER-READ INTERPRETATION  CT CHEST  The following report is an over-read performed by radiologist Dr. Royal Piedra [facility] on 03/24/2015. This over-read  does not include interpretation of cardiac or coronary anatomy or pathology. The coronary calcium score/coronary CTA interpretation by the cardiologist is attached.  COMPARISON:  Chest CT 05/28/2006. CT of the abdomen and pelvis 01/09/2015.  FINDINGS: Linear scarring in the right upper lobe. Small calcified granulomas in the lower lobes of the lungs bilaterally. Within the visualized portions of the thorax there are no other suspicious appearing pulmonary nodules or masses, there is no acute consolidative airspace disease, no pleural effusions and no pneumothorax. Multiple borderline enlarged and mildly enlarged mediastinal and hilar lymph nodes are noted, measuring up to 14 mm in short axis in the subcarinal nodal station. Visualized portions of the upper abdomen are unremarkable. There are no aggressive appearing lytic or blastic lesions noted in the visualized portions of the skeleton.  IMPRESSION: 1. Multiple borderline enlarged and mildly enlarged mediastinal and hilar lymph nodes bilaterally. This is of uncertain etiology and significance, but is new compared to remote prior study from 05/28/2006 and could be seen in the setting of a systemic disease such as sarcoidosis, or could be seen in the setting of a lymphoproliferative disorder. Clinical correlation is suggested.  Electronically Signed: By: Trudie Reed M.D. On: 03/24/2015 09:23   I  have personally reviewed and evaluated these images and lab results as part of my medical decision-making.   EKG Interpretation   Date/Time:  Friday March 26 2015 02:36:10 EDT Ventricular Rate:  70 PR Interval:    QRS Duration: 117 QT Interval:  432 QTC Calculation: 466 R Axis:   -23 Text Interpretation:  Accelerated junctional rhythm Incomplete left bundle  branch block ST elevation, consider inferior injury No significant change  was found Confirmed by Jonel Weldon  MD, Dewaine Morocho ([id number]) on 03/26/2015 6:34:05 AM      MDM   Final diagnoses:  HCAP  (healthcare-associated pneumonia)    Concerning for pneumonia given the patient's exertional shortness of breath.  This could represent worsening aortic stenosis however there is no congestive heart failure present clinically at this time.  Patient be admitted.  IV antibiotics.  Patient was hospitalized less than 90 days ago therefore he will be started on medication to cover for healthcare associated pneumonia.   I personally performed the services described in this documentation, which was scribed in my presence. The recorded information has been reviewed and is accurate.      Azalia Bilis, MD 03/26/15 [phone]

## 2015-03-26 NOTE — Consult Note (Signed)
Reason for Consult: Worsening dyspnea  Requesting Physician: Toniann Fail  Cardiologist: Tilley/ Cooper-TAVR workup  HPI: This is a 81 y.o. male with a past medical history significant for critical aortic stenosis currently undergoing workup for possible TAVR. He has been admitted to the hospital twice with congestive heart failure in the last year. Most recently he was discharged on July 7.   He has known coronary artery disease and received a stent to the left circumflex coronary artery in 2010 and just underwent cardiac catheterization on August 22 by Dr. Excell Seltzer (see results below). 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 He has OSA and probably has obesity hypoventilation syndrome and during his most recent hospitalization required mechanical ventilation for hypoxic/hypercarbic respiratory failure. During that same hospitalization he had staphylococcal bacteremia. There was no evidence of vegetations on conventional echocardiography and TEE could not be performed due to his illness. He was treated empirically with a month of intravenous vancomycin. Repeat blood cultures drawn recently (03/22/15) in the clinic have been negative.  He presents with roughly 4 days of worsening shortness of breath and worsening edema, but his swelling has been worse in the past. He has not had fever, chills, chest pain and does not clearly describe orthopnea or PND.  His chest x-ray is difficult to interpret due to his body habitus, but does appear to show some infiltrates. Of note his current pro BNP level is much lower than it has been in the past and is only slightly above the upper limit of normal. His weight today is roughly 4 pounds higher than it was when he  had his precath evaluation in clinic with Dr. Excell Seltzer on August 19. Cardiac catheterization on August 22, when he presumably weighed about the same, showed a mean wedge pressure of 15 mmHg, upper limit of normal  ECHO 01/09/2015:  normal LV systolic function with an ejection fraction of 55-60% and moderate aortic stenosis with a peak velocity of 386 cm/s and mean gradient of 32 mmHg. The mitral annulus was noted to be calcified and there was left atrial dilatation present. Transaortic valve gradients had increased from previous studies   CATH 03/15/15:   Aortic Mean Gradient  37.9 mmHg   Aortic Peak Gradient  36 mmHg   Aortic Valve Area  0.83   Aortic Value Area Index  0.37 cm2/BSA    PA Systolic Pressure  51 mmHg   PA Diastolic Pressure  17 mmHg   PA Mean  31 mmHg   PW A Wave  22 mmHg   PW V Wave  19 mmHg   PW Mean  15 mmHg      Mid RCA lesion, 60% stenosed.  Ost RCA lesion, 40% stenosed.  Ost LM to LM lesion, 30% stenosed.  Ost Cx lesion, 50% stenosed.  Prox LAD to Mid LAD lesion, 40% stenosed.  Ost 1st Mrg to 1st Mrg lesion, 20% stenosed. The lesion was previously treated with a stent (unknown type) greater than two years ago.  1. Moderate stenosis of the right coronary artery 2. Heavy calcification with mild nonobstructive stenosis of the LAD 3. Mild to moderate ostial left circumflex stenosis with continued patency of the stented segment in the first OM 4. Calcified aortic valve with hemodynamic findings consistent with severe aortic  stenosis Diagnostic Diagram          PMHx:  Past Medical History  Diagnosis Date  . Gout, unspecified     severe dz, "treatment done that last 15 years"  . Obesity (BMI 30-39.9)   . Restless leg syndrome   . History of retinal detachment 1994  . Hypertensive heart disease   . CAD (coronary artery disease), native coronary artery   . History of pericarditis 2007    MSSA, s/p pericardial window  . GERD   .  Osteoarthritis   . Sleep apnea in adult     CPAP qhs  . TIA (transient ischemic attack) 2005  . BPH (benign prostatic hypertrophy)     "minor"  . Lumbar spinal stenosis   . Carotid artery occlusion   . Unspecified venous (peripheral) insufficiency   . Stroke May 2003  . Anxiety   . Depression   . Aortic stenosis     ECHO 05/29/14 moderate AS Mean gradient 21 mm EF 55% ECHO 07/09/13  Mean gradient 12 mm Mild to moderate AS EF 50%    . CAD (coronary artery disease)     02/11/09  Xience stent to circumflex 2.5 x 18 mm postdilated to 2.75 mm  Cath sept 2011  normal Left main, 20 % stenosis proximal LAD, 40% stenosis mid LAD, 70% stenosis proximal Diag 1, 90% stenosis prox CFX, 90% first OM, small and nondominant RCA;   . Carotid artery disease     Prior TIA 2006 with left CEA by Dr. Arbie Cookey Recurrent TIA in April 2011   . Type 2 diabetes mellitus with vascular disease   . Lumbar disc disease   . CKD (chronic kidney disease), stage III 02/08/2014  . CHF (congestive heart failure)    Past Surgical History  Procedure Laterality Date  . Pericardial window  2007  . Left arm  2008    shoulder  . Right arm  1970's    shoulder  . Cataract extraction      Left side x's 2 and right  . Retinal detachment surgery      left side  . Carotid endarterectomy Left 12-06-05    cea  . Coronary angioplasty  01/2009    x 1 stent  . Colonoscopy    . Carpal tunnel release Bilateral   . Back surgery  2013    removed bone spurs  . Inguinal hernia repair Left 10/01/2013    Procedure: HERNIA REPAIR INGUINAL ADULT;  Surgeon: Axel Filler, MD;  Location: [facility];  Service: General;  Laterality: Left;  . Insertion of mesh Left 10/01/2013    Procedure: INSERTION OF MESH;  Surgeon: Axel Filler, MD;  Location: [facility];  Service: General;  Laterality: Left;  . Finger amputation  May 2015    JUST TO FIRST JOINT RIGHT HAND  LAST FINGER ( Right 5th finger)  . Peripheral vascular catheterization N/A 01/08/2015     Procedure: Abdominal Aortogram;  Surgeon: Chuck Hint, MD;  Location: [facility] INVASIVE CV LAB;  Service: Cardiovascular;  Laterality: N/A;  . Cardiac catheterization N/A 03/15/2015    Procedure: Right/Left Heart Cath and Coronary Angiography;  Surgeon: Tonny Bollman, MD;  Location: [facility] INVASIVE CV LAB;  Service: Cardiovascular;  Laterality: N/A;    FAMHx: Family History  Problem Relation Age of Onset  . Heart disease Mother     Before age 66  . Heart attack Mother   . Diabetes Father   . Heart disease Father   .  Breast cancer Sister   . Cancer Sister   . Prostate cancer Brother   . Lung cancer Brother   . Cancer Brother   . Diabetes Daughter   . Diabetes Son   . Hypertension Son     SOCHx:  reports that he has never smoked. He has never used smokeless tobacco. He reports that he does not drink alcohol or use illicit drugs.  ALLERGIES: Allergies  Allergen Reactions  . Statins Other (See Comments)    Severe leg myalgias, weakness    ROS: Pertinent items are noted in HPI. Otherwise negative.  HOME MEDICATIONS:   Prior to Admission:  Prescriptions prior to admission  Medication Sig Dispense Refill Last Dose  . acetaminophen (TYLENOL) 325 MG tablet Take 2 tablets (650 mg total) by mouth every 6 (six) hours as needed for mild pain or moderate pain. (Patient taking differently: Take 325 mg by mouth every 6 (six) hours as needed for mild pain or moderate pain. )   UNK  . allopurinol (ZYLOPRIM) 300 MG tablet Take 1 tablet (300 mg total) by mouth every morning. 90 tablet 1 03/25/2015 at Unknown time  . amLODipine (NORVASC) 5 MG tablet Take 5 mg by mouth daily as needed (Pt states he only takes if Heart Rate is high).   UNK  . aspirin EC 81 MG tablet Take 81 mg by mouth daily.   03/25/2015 at Unknown time  . buPROPion (WELLBUTRIN XL) 150 MG 24 hr tablet Take 1 tablet (150 mg total) by mouth every morning. 90 tablet 1 03/25/2015 at Unknown time  . cholecalciferol (VITAMIN D) 1000  UNITS tablet Take 1,000 Units by mouth daily.   03/25/2015 at Unknown time  . clopidogrel (PLAVIX) 75 MG tablet Take 1 tablet (75 mg total) by mouth daily. 90 tablet 3 03/25/2015 at Unknown time  . docusate sodium 100 MG CAPS Take 100 mg by mouth 2 (two) times daily. 10 capsule 0 03/25/2015 at Unknown time  . Ferrous Gluconate 256 (28 FE) MG TABS Take 256 mg by mouth daily.   03/25/2015 at Unknown time  . furosemide (LASIX) 40 MG tablet Take 1 tablet (40 mg total) by mouth daily.   03/25/2015 at Unknown time  . LORazepam (ATIVAN) 1 MG tablet TAKE ONE TABLET BY MOUTH EVERY 8 HOURS AS NEEDED FOR ANXIETY/SLEEP 30 tablet 0 UNK  . Magnesium 250 MG TABS Take 250 mg by mouth daily.    03/25/2015 at Unknown time  . metoprolol succinate (TOPROL-XL) 50 MG 24 hr tablet Take 1 tablet (50 mg total) by mouth daily. Take with or immediately following a meal. 90 tablet 3 03/25/2015 at 1000  . mirabegron ER (MYRBETRIQ) 50 MG TB24 tablet Take 1 tablet (50 mg total) by mouth daily. 90 tablet 1 03/25/2015 at Unknown time  . mirtazapine (REMERON) 15 MG tablet Take 1 tablet (15 mg total) by mouth at bedtime. 30 tablet 11 03/25/2015 at Unknown time  . pantoprazole (PROTONIX) 40 MG tablet Take 1 tablet (40 mg total) by mouth 2 (two) times daily. 90 tablet 1 03/25/2015 at Unknown time  . pregabalin (LYRICA) 200 MG capsule Take 1 capsule (200 mg total) by mouth at bedtime. 90 capsule 1 03/25/2015 at Unknown time  . rOPINIRole (REQUIP) 4 MG tablet Take 1 tablet (4 mg total) by mouth at bedtime. 90 tablet 1 03/25/2015 at Unknown time  . tamsulosin (FLOMAX) 0.4 MG CAPS capsule Take 1 capsule (0.4 mg total) by mouth daily. 90 capsule 3 03/25/2015 at Unknown  time   HOSPITAL MEDICATIONS:: . allopurinol  300 mg Oral q morning - 10a  . aspirin EC  81 mg Oral Daily  . buPROPion  150 mg Oral q morning - 10a  . clopidogrel  75 mg Oral Daily  . docusate sodium  100 mg Oral BID  . enoxaparin (LOVENOX) injection  40 mg Subcutaneous Daily  . ferrous gluconate   324 mg Oral Q breakfast  . furosemide  40 mg Oral Daily  . magnesium oxide  400 mg Oral Daily  . metoprolol succinate  50 mg Oral Daily  . mirabegron ER  50 mg Oral Daily  . mirtazapine  15 mg Oral QHS  . pantoprazole  40 mg Oral BID  . piperacillin-tazobactam (ZOSYN)  IV  3.375 g Intravenous 3 times per day  . pregabalin  200 mg Oral QHS  . rOPINIRole  4 mg Oral QHS  . sodium chloride  3 mL Intravenous Q12H  . tamsulosin  0.4 mg Oral Daily  . [START ON 03/27/2015] vancomycin  1,250 mg Intravenous Q24H  . vancomycin  1,000 mg Intravenous Once     VITALS: Blood pressure 145/76, pulse 70, temperature 97.9 F (36.6 C), temperature source Rectal, resp. rate 18, height 5\' 10"  (1.778 m), weight 235 lb 10.8 oz (106.9 kg), SpO2 95 %.  PHYSICAL EXAM:  General: Alert, oriented x3, no distress, obesity does limit his exam to some degree Head: no evidence of trauma, PERRL, EOMI, no exophtalmos or lid lag, no myxedema, no xanthelasma; normal ears, nose and oropharynx Neck: normal jugular venous pulsations and no hepatojugular reflux; brisk carotid pulses without delay and no carotid bruits Chest: clear to auscultation, no signs of consolidation by percussion or palpation, normal fremitus, symmetrical and full respiratory excursions Cardiovascular: normal position and quality of the apical impulse, regular rhythm, normal first heart sound and very weak aortic component of the second heart sound, no rubs or gallops, 3/6 mid-late peaking systolic murmur in the aortic focus radiating broadly through the chest, no diastolic murmur Abdomen: no tenderness or distention, no masses by palpation, no abnormal pulsatility or arterial bruits, normal bowel sounds, no hepatosplenomegaly Extremities: no clubbing, cyanosis;  2-3 plus bilateral symmetrical calf and pedal edema; 2+ radial, ulnar and brachial pulses bilaterally; 2+ right femoral, posterior tibial and dorsalis pedis pulses; 2+ left femoral, posterior  tibial and dorsalis pedis pulses; no subclavian or femoral bruits Neurological: grossly nonfocal   LABS  CBC  Recent Labs  03/26/15 0243  WBC 5.8  NEUTROABS 3.2  HGB 13.4  HCT 42.1  MCV 92.9  PLT 125*   Basic Metabolic Panel  Recent Labs  03/26/15 0243  NA 139  K 3.9  CL 104  CO2 27  GLUCOSE 98  BUN 13  CREATININE 1.40*  CALCIUM 9.1   Cardiac Enzymes  Recent Labs  03/26/15 0243  TROPONINI <0.03   BNP    Component Value Date/Time   BNP 149.3* 03/26/2015 0243    ProBNP    Component Value Date/Time   PROBNP 814.6* 07/13/2014 1928      IMAGING: Dg Chest 2 View  03/26/2015   CLINICAL DATA:  Acute onset of shortness of breath. Initial encounter.  EXAM: CHEST  2 VIEW  COMPARISON:  Chest radiograph performed 01/24/2015, and CTA of the chest performed 03/24/2015  FINDINGS: The lungs are well-aerated. Mild central bibasilar airspace opacities likely reflect atelectasis, though mild pneumonia might have a similar appearance. Pulmonary vascularity is at the upper limits of normal. There  is no evidence of pleural effusion or pneumothorax.  The heart is borderline normal in size. No acute osseous abnormalities are seen.  IMPRESSION: Mild central bibasilar airspace opacities likely reflect atelectasis, though mild pneumonia might have a similar appearance.   Electronically Signed   By: Roanna Raider M.D.   On: 03/26/2015 03:19    ECG: Poor quality tracing, probably sinus rhythm, minor intraventricular conduction delay, no acute repolarization abnormalities  TELEMETRY: NSR  IMPRESSION: 1. Critical Aortic stenosis, currently undergoing pre-TAVR workup 2. Acute on chronic diastolic heart failure 3. Chronic hypoxic/hypercarbic respiratory failure probably related to obesity/hypoventilation syndrome and obstructive sleep apnea 4. Recent history of staphylococcal bacteremia, status post 4 weeks of intravenous antibiotics. 5.  History of carotid stenosis status post  left endarterectomy with multiple episodes of stroke/TIA 6. Mild acute renal insufficiency, likely due to fluid shifts/congestive heart failure and recent contrast exposure RECOMMENDATION:  1. Cautious diuresis, he is probably no more than 4-5 pounds above his target fluid range. He does not have many overt findings of hypervolemia. He does clearly have orthopnea and I think signifies worsening of his aortic valve problem. Excessive diuresis could lead to dangerous hypotension in the setting of severe aortic stenosis 2. We'll try to accelerate his workup. He just received contrast on August 22 for cardiac catheterization and on August 31 for chest CTA, but is still due to have his abdomen-pelvis CTA. If creatinine is improved tomorrow will do with the test over the weekend. Of note, he had a CT angiogram of the abdomen and pelvis on June 18 and this might be sufficient for his workup. Also of note he had carotid ultrasonography performed June 26 of this year. He may still need to have pulmonary function tests. If Dr. Cornelius Moras is available today or over the weekend we'll see if his surgical evaluation can also be moved up.  Time Spent Directly with Patient: 45 minutes  Thurmon Fair, MD, [facility] [phone] office [phone] pager   03/26/2015, 9:21 AM

## 2015-03-26 NOTE — Progress Notes (Signed)
[facility] PROGRESS NOTE  [name] ZOX:[id number] DOB: 1933-04-08 DOA: 03/26/2015 PCP: [name], MD  Assessment/Plan: 1. Acute respiratory failure with hypoxia - likely secondary to symptomatic aortic stenosis. Patient's initial chest x-ray w/ findings of possible infiltrates, thus antibiotics were empirically started for possible healthcare. Pro-calcitonin levels are negative, thus will stop abx. Troponin's serially neg. Patient is on Lasix 40 mg po qday. Cardiology consulted. Recs for hopefully moving up surgical eval for AS. 2. CAD status post stenting - cycle cardiac markers. On aspirin and Plavix and metoprolol. Pt is intolerant to statins. 3. OSA on CPAP. 4. Chronic kidney disease stage III - creatinine appears to be at baseline. 5. Recent admission in June for staph bacteremia and had completed course with vancomycin. Stable. Afebrile 6. History of staph pericarditis status post pericardial window. 7. History of gout on allopurinol. 8. BPH on tamsulosin.  Code Status: Full Family Communication: Pt in room (indicate person spoken with, relationship, and if by phone, the number) Disposition Plan: Pending   Consultants:  Cardiology  Procedures:    Antibiotics:  Vancomycin 9/2>>>9/2  Zosyn 9/2>>>9/2 (indicate start date, and stop date if known)  HPI/Subjective: States feeling eager to have valve surgery done  Objective: Filed Vitals:   03/26/15 0645 03/26/15 0715 03/26/15 0821 03/26/15 1341  BP: 143/59 158/65 145/76 132/55  Pulse: 64 70 70 64  Temp:   97.9 F (36.6 C) 99.1 F (37.3 C)  TempSrc:   Oral Oral  Resp: Height:    (1.778 m)   Weight:   106.9 kg (235 lb 10.8 oz)   SpO2: 97% 98% 95% 99%    Intake/Output Summary (Last 24 hours) at 03/26/15 1747 Last data filed at 03/26/15 1436  Gross per 24 hour  Intake    462 ml  Output      0 ml  Net    462 ml   Filed Weights   03/26/15 0821  Weight: 106.9 kg (235 lb  10.8 oz)    Exam:   General:  Awake, in nad  Cardiovascular: regular, s1, s2  Respiratory: normal resp effort, no wheezing  Abdomen: soft,nondistended  Musculoskeletal: perfused, no clubbing   Data Reviewed: Basic Metabolic Panel:  Recent Labs Lab 03/26/15 0243 03/26/15 1002  NA 139 139  K 3.9 3.7  CL 104 104  CO2 27 27  GLUCOSE 98 111*  BUN 13 11  CREATININE 1.40* 1.32*  CALCIUM 9.1 8.8*   Liver Function Tests:  Recent Labs Lab 03/26/15 1002  AST 30  ALT 20  ALKPHOS 73  BILITOT 0.9  PROT 6.7  ALBUMIN 3.5   No results for input(s): LIPASE, AMYLASE in the last 168 hours. No results for input(s): AMMONIA in the last 168 hours. CBC:  Recent Labs Lab 03/26/15 0243 03/26/15 1002  WBC 5.8 6.8  NEUTROABS 3.2 4.9  HGB 13.4 12.9*  HCT 42.1 40.3  MCV 92.9 93.1  PLT 125* 121*   Cardiac Enzymes:  Recent Labs Lab 03/26/15 0243 03/26/15 1002 03/26/15 1530  TROPONINI <0.03 <0.03 <0.03   BNP (last 3 results)  Recent Labs  01/09/15 0103 01/10/15 0919 03/26/15 0243  BNP 168.2* 285.0* 149.3*    ProBNP (last 3 results)  Recent Labs  05/28/14 1508 07/10/14 0801 07/13/14 1928  PROBNP 477.3* 422.7 814.6*    CBG:  Recent Labs Lab 03/26/15 0644 03/26/15 1210 03/26/15 1655  GLUCAP 86 110* 116*    Recent Results (from the past 240 hour(s))  Culture, blood (single)     Status: None (Preliminary result)   Collection Time: 03/22/15 11:00 AM  Result Value Ref Range Status   Preliminary Report Blood Culture received; No Growth to date;  Preliminary   Preliminary Report Culture will be held for 5 days before issuing  Preliminary    Comment: Culture results may be compromised due to an excessive volume of blood received in culture bottles.    Preliminary Report a Final Negative report.  Preliminary  Culture, blood (single)     Status: None (Preliminary result)   Collection Time: 03/22/15 11:00 AM  Result Value Ref Range Status    Preliminary Report Blood Culture received; No Growth to date;  Preliminary   Preliminary Report Culture will be held for 5 days before issuing  Preliminary    Comment: Culture results may be compromised due to an excessive volume of blood received in culture bottles.    Preliminary Report a Final Negative report.  Preliminary     Studies: Dg Chest 2 View  03/26/2015   CLINICAL DATA:  Acute onset of shortness of breath. Initial encounter.  EXAM: CHEST  2 VIEW  COMPARISON:  Chest radiograph performed 01/24/2015, and CTA of the chest performed 03/24/2015  FINDINGS: The lungs are well-aerated. Mild central bibasilar airspace opacities likely reflect atelectasis, though mild pneumonia might have a similar appearance. Pulmonary vascularity is at the upper limits of normal. There is no evidence of pleural effusion or pneumothorax.  The heart is borderline normal in size. No acute osseous abnormalities are seen.  IMPRESSION: Mild central bibasilar airspace opacities likely reflect atelectasis, though mild pneumonia might have a similar appearance.   Electronically Signed   By: Roanna Raider M.D.   On: 03/26/2015 03:19    Scheduled Meds: . allopurinol  300 mg Oral q morning - 10a  . aspirin EC  81 mg Oral Daily  . buPROPion  150 mg Oral q morning - 10a  . clopidogrel  75 mg Oral Daily  . docusate sodium  100 mg Oral BID  . enoxaparin (LOVENOX) injection  40 mg Subcutaneous Daily  . ferrous gluconate  324 mg Oral Q breakfast  . furosemide  40 mg Oral Daily  . magnesium oxide  400 mg Oral Daily  . metoprolol succinate  50 mg Oral Daily  . mirabegron ER  50 mg Oral Daily  . mirtazapine  15 mg Oral QHS  . pantoprazole  40 mg Oral BID  . pregabalin  200 mg Oral QHS  . rOPINIRole  4 mg Oral QHS  . sodium chloride  3 mL Intravenous Q12H  . tamsulosin  0.4 mg Oral Daily   Continuous Infusions:   Active Problems:   Obstructive sleep apnea, adult   Aortic stenosis   CAD (coronary artery disease)    CKD (chronic kidney disease), stage III   Acute respiratory failure with hypoxia   Acute respiratory failure   Jairus Tonne K  [facility] Pager [phone]. If 7PM-7AM, please contact night-coverage at [email or web address], password [facility] 03/26/2015, 5:47 PM  LOS: 0 days

## 2015-03-26 NOTE — Progress Notes (Signed)
ANTIBIOTIC CONSULT NOTE - INITIAL  Pharmacy Consult for vanc/zosyn Indication: HCAP  Allergies  Allergen Reactions  . Statins Other (See Comments)    Severe leg myalgias, weakness    Patient Measurements: Height: 5\' 10"  (177.8 cm) Weight: 235 lb 10.8 oz (106.9 kg) IBW/kg (Calculated) : 73  Vital Signs: Temp: 97.9 F (36.6 C) (09/02 0821) Temp Source: Rectal (09/02 0419) BP: 145/76 mmHg (09/02 0821) Pulse Rate: 70 (09/02 0821) Intake/Output from previous day:   Intake/Output from this shift:    Labs:  Recent Labs  03/26/15 0243  WBC 5.8  HGB 13.4  PLT 125*  CREATININE 1.40*   Estimated Creatinine Clearance: 51.5 mL/min (by C-G formula based on Cr of 1.4). No results for input(s): VANCOTROUGH, VANCOPEAK, VANCORANDOM, GENTTROUGH, GENTPEAK, GENTRANDOM, TOBRATROUGH, TOBRAPEAK, TOBRARND, AMIKACINPEAK, AMIKACINTROU, AMIKACIN in the last 72 hours.   Microbiology: Recent Results (from the past 720 hour(s))  Culture, blood (single)     Status: None (Preliminary result)   Collection Time: 03/22/15 11:00 AM  Result Value Ref Range Status   Preliminary Report Blood Culture received; No Growth to date;  Preliminary   Preliminary Report Culture will be held for 5 days before issuing  Preliminary    Comment: Culture results may be compromised due to an excessive volume of blood received in culture bottles.    Preliminary Report a Final Negative report.  Preliminary  Culture, blood (single)     Status: None (Preliminary result)   Collection Time: 03/22/15 11:00 AM  Result Value Ref Range Status   Preliminary Report Blood Culture received; No Growth to date;  Preliminary   Preliminary Report Culture will be held for 5 days before issuing  Preliminary    Comment: Culture results may be compromised due to an excessive volume of blood received in culture bottles.    Preliminary Report a Final Negative report.  Preliminary    Medical History: Past Medical History   Diagnosis Date  . Gout, unspecified     severe dz, "treatment done that last 15 years"  . Obesity (BMI 30-39.9)   . Restless leg syndrome   . History of retinal detachment 1994  . Hypertensive heart disease   . CAD (coronary artery disease), native coronary artery   . History of pericarditis 2007    MSSA, s/p pericardial window  . GERD   . Osteoarthritis   . Sleep apnea in adult     CPAP qhs  . TIA (transient ischemic attack) 2005  . BPH (benign prostatic hypertrophy)     "minor"  . Lumbar spinal stenosis   . Carotid artery occlusion   . Unspecified venous (peripheral) insufficiency   . Stroke May 2003  . Anxiety   . Depression   . Aortic stenosis     ECHO 05/29/14 moderate AS Mean gradient 21 mm EF 55% ECHO 07/09/13  Mean gradient 12 mm Mild to moderate AS EF 50%    . CAD (coronary artery disease)     02/11/09  Xience stent to circumflex 2.5 x 18 mm postdilated to 2.75 mm  Cath sept 2011  normal Left main, 20 % stenosis proximal LAD, 40% stenosis mid LAD, 70% stenosis proximal Diag 1, 90% stenosis prox CFX, 90% first OM, small and nondominant RCA;   . Carotid artery disease     Prior TIA 2006 with left CEA by Dr. Arbie Cookey Recurrent TIA in April 2011   . Type 2 diabetes mellitus with vascular disease   . Lumbar disc disease   .  CKD (chronic kidney disease), stage III 02/08/2014  . CHF (congestive heart failure)     Assessment: 82 yom presenting with SOB. Hx of severe AS. Pharmacy consulted to dose vanc/zosyn for HCAP. Afebrile, wbc wnl.  9/2 CXR - atelectasis vs. Mild pna. Given vanc 1x doses of vanc/zosyn in the ED at 0400. Normalized CrCl~42  9/2 vanc>> 9/2 zosyn>>  Goal of Therapy:  Vancomycin trough level 15-20 mcg/ml  Plan:  Give another vanc 1g x 1 dose (to complete 2g load) Vanc  q24h to start tomorrow Zosyn 3.375g IV q8h Mon clinical progress, renal function, abx plan VT@SS  as indicated  Babs Bertin, PharmD Clinical Pharmacist Pager [phone] 03/26/2015  8:47 AM

## 2015-03-26 NOTE — Consult Note (Signed)
WOC wound consult note Reason for Consult: right foot wound; noted to have right shin skin tear as well Wound type: neuropathic ulcer Measurement: 0.7cm x 0.5cm x 0.2cm  Wound bed: pale, pink, with some hyperkeratosis Drainage (amount, consistency, odor) minimal, serous Periwound: intact  Dressing procedure/placement/frequency: Silver hydrofiber for bioburdan and top with foam dressing, change every 3 days.  This has been successful in the past.  Silcone foam to the pretibal skin tear.  Discussed POC with patient and bedside nurse.  Re consult if needed, will not follow at this time. Thanks  Jacqueline Delapena [employer], CWOCN [phone])

## 2015-03-26 NOTE — H&P (Signed)
[facility] History and Physical  Harjot Palmateer WUJ:[id number] DOB: 06/22/1933 DOA: 03/26/2015  Referring physician: Dr. Patria Mane. PCP: [name], MD  Specialists: Dr. Donnie Aho. Cardiologist.  Chief Complaint: Shortness of breath.  HPI: [name] is a 81 y.o. male with history of symptomatic aortic stenosis and has had recent cath, recently admitted in June for staph bacteremia, CAD status post stenting, staph pericarditis status post pericardial window, hypertension, chronic kidney disease stage III and OSA presents to the ER because of worsening shortness of breath. Patient states he has been chronically short of breath but last 4 days he has become very short of breath to the point he is not even able to wear shoes. Denies any chest pain or productive cough or fever chills. Chest x-ray shows possible infiltrates. Patient has been admitted for further management of his acute respiratory failure. On exam patient not in distress. Patient is afebrile and does not have any leukocytosis. Patient has been empirically started on antibiotics per the ER physician for possible pneumonia. Patient otherwise denies any nausea vomiting diarrhea fever chills or change in medications.   Review of Systems: As presented in the history of presenting illness, rest negative.  Past Medical History  Diagnosis Date  . Gout, unspecified     severe dz, "treatment done that last 15 years"  . Obesity (BMI 30-39.9)   . Restless leg syndrome   . History of retinal detachment 1994  . Hypertensive heart disease   . CAD (coronary artery disease), native coronary artery   . History of pericarditis 2007    MSSA, s/p pericardial window  . GERD   . Osteoarthritis   . Sleep apnea in adult     CPAP qhs  . TIA (transient ischemic attack) 2005  . BPH (benign prostatic hypertrophy)     "minor"  . Lumbar spinal stenosis   . Carotid artery occlusion   . Unspecified venous (peripheral) insufficiency   .  Stroke May 2003  . Anxiety   . Depression   . Aortic stenosis     ECHO 05/29/14 moderate AS Mean gradient 21 mm EF 55% ECHO 07/09/13  Mean gradient 12 mm Mild to moderate AS EF 50%    . CAD (coronary artery disease)     02/11/09  Xience stent to circumflex 2.5 x 18 mm postdilated to 2.75 mm  Cath sept 2011  normal Left main, 20 % stenosis proximal LAD, 40% stenosis mid LAD, 70% stenosis proximal Diag 1, 90% stenosis prox CFX, 90% first OM, small and nondominant RCA;   . Carotid artery disease     Prior TIA 2006 with left CEA by Dr. Arbie Cookey Recurrent TIA in April 2011   . Type 2 diabetes mellitus with vascular disease   . Lumbar disc disease   . CKD (chronic kidney disease), stage III 02/08/2014  . CHF (congestive heart failure)    Past Surgical History  Procedure Laterality Date  . Pericardial window  2007  . Left arm  2008    shoulder  . Right arm  1970's    shoulder  . Cataract extraction      Left side x's 2 and right  . Retinal detachment surgery      left side  . Carotid endarterectomy Left 12-06-05    cea  . Coronary angioplasty  01/2009    x 1 stent  . Colonoscopy    . Carpal tunnel release Bilateral   . Back surgery  2013    removed bone spurs  .  Inguinal hernia repair Left 10/01/2013    Procedure: HERNIA REPAIR INGUINAL ADULT;  Surgeon: Axel Filler, MD;  Location: [facility] ORS;  Service: General;  Laterality: Left;  . Insertion of mesh Left 10/01/2013    Procedure: INSERTION OF MESH;  Surgeon: Axel Filler, MD;  Location: [facility] ORS;  Service: General;  Laterality: Left;  . Finger amputation  May 2015    JUST TO FIRST JOINT RIGHT HAND  LAST FINGER ( Right 5th finger)  . Peripheral vascular catheterization N/A 01/08/2015    Procedure: Abdominal Aortogram;  Surgeon: Chuck Hint, MD;  Location: [facility] INVASIVE CV LAB;  Service: Cardiovascular;  Laterality: N/A;  . Cardiac catheterization N/A 03/15/2015    Procedure: Right/Left Heart Cath and Coronary Angiography;  Surgeon:  Tonny Bollman, MD;  Location: [facility] INVASIVE CV LAB;  Service: Cardiovascular;  Laterality: N/A;   Social History:  reports that he has never smoked. He has never used smokeless tobacco. He reports that he does not drink alcohol or use illicit drugs. Where does patient live at home. Can patient participate in ADLs? Yes.  Allergies  Allergen Reactions  . Statins Other (See Comments)    Severe leg myalgias, weakness    Family History:  Family History  Problem Relation Age of Onset  . Heart disease Mother     Before age 49  . Heart attack Mother   . Diabetes Father   . Heart disease Father   . Breast cancer Sister   . Cancer Sister   . Prostate cancer Brother   . Lung cancer Brother   . Cancer Brother   . Diabetes Daughter   . Diabetes Son   . Hypertension Son       Prior to Admission medications   Medication Sig Start Date End Date Taking? Authorizing Provider  acetaminophen (TYLENOL) 325 MG tablet Take 2 tablets (650 mg total) by mouth every 6 (six) hours as needed for mild pain or moderate pain. Patient taking differently: Take 325 mg by mouth every 6 (six) hours as needed for mild pain or moderate pain.  07/21/14  Yes Elease Etienne, MD  allopurinol (ZYLOPRIM) 300 MG tablet Take 1 tablet (300 mg total) by mouth every morning. 10/06/14  Yes Newt Lukes, MD  amLODipine (NORVASC) 5 MG tablet Take 5 mg by mouth daily as needed (Pt states he only takes if Heart Rate is high).   Yes Historical Provider, MD  aspirin EC 81 MG tablet Take 81 mg by mouth daily.   Yes Historical Provider, MD  buPROPion (WELLBUTRIN XL) 150 MG 24 hr tablet Take 1 tablet (150 mg total) by mouth every morning. 10/06/14  Yes Newt Lukes, MD  cholecalciferol (VITAMIN D) 1000 UNITS tablet Take 1,000 Units by mouth daily.   Yes Historical Provider, MD  clopidogrel (PLAVIX) 75 MG tablet Take 1 tablet (75 mg total) by mouth daily. 11/11/14  Yes Newt Lukes, MD  docusate sodium 100 MG CAPS Take  100 mg by mouth 2 (two) times daily. 07/16/14  Yes Ripudeep Jenna Luo, MD  Ferrous Gluconate 256 (28 FE) MG TABS Take 256 mg by mouth daily.   Yes Historical Provider, MD  furosemide (LASIX) 40 MG tablet Take 1 tablet (40 mg total) by mouth daily. 07/21/14  Yes Elease Etienne, MD  LORazepam (ATIVAN) 1 MG tablet TAKE ONE TABLET BY MOUTH EVERY 8 HOURS AS NEEDED FOR ANXIETY/SLEEP 03/23/15  Yes Etta Grandchild, MD  Magnesium 250 MG TABS Take 250 mg  by mouth daily.    Yes Historical Provider, MD  metoprolol succinate (TOPROL-XL) 50 MG 24 hr tablet Take 1 tablet (50 mg total) by mouth daily. Take with or immediately following a meal. 11/11/14  Yes Newt Lukes, MD  mirabegron ER (MYRBETRIQ) 50 MG TB24 tablet Take 1 tablet (50 mg total) by mouth daily. 10/06/14  Yes Newt Lukes, MD  mirtazapine (REMERON) 15 MG tablet Take 1 tablet (15 mg total) by mouth at bedtime. 09/10/14  Yes Newt Lukes, MD  pantoprazole (PROTONIX) 40 MG tablet Take 1 tablet (40 mg total) by mouth 2 (two) times daily. 10/06/14  Yes Newt Lukes, MD  pregabalin (LYRICA) 200 MG capsule Take 1 capsule (200 mg total) by mouth at bedtime. 12/15/14  Yes Newt Lukes, MD  rOPINIRole (REQUIP) 4 MG tablet Take 1 tablet (4 mg total) by mouth at bedtime. 10/06/14  Yes Newt Lukes, MD  tamsulosin (FLOMAX) 0.4 MG CAPS capsule Take 1 capsule (0.4 mg total) by mouth daily. 11/11/14  Yes Newt Lukes, MD    Physical Exam: Filed Vitals:   03/26/15 0445 03/26/15 0500 03/26/15 0515 03/26/15 0530  BP: 135/55 135/54 145/60 134/56  Pulse: 63 65 66 64  Temp:      TempSrc:      Resp: 17 29 22 15   SpO2: 94% 97% 98% 97%     General:  Moderately built and nourished.  Eyes: Anicteric no pallor.  ENT: No discharge from the ears eyes nose and mouth.  Neck: No mass felt. No JVD appreciated.  Cardiovascular: S1 and S2 heard systolic murmur heard.  Respiratory: No rhonchi or crepitations.  Abdomen: Soft nontender  bowel sounds present.  Skin: No rash.  Musculoskeletal: Bilateral lower extremity edema.  Psychiatric: Appears normal.  Neurologic: Alert awake oriented to time place and person. Moves all extremities.  Labs on Admission:  Basic Metabolic Panel:  Recent Labs Lab 03/26/15 0243  NA 139  K 3.9  CL 104  CO2 27  GLUCOSE 98  BUN 13  CREATININE 1.40*  CALCIUM 9.1   Liver Function Tests: No results for input(s): AST, ALT, ALKPHOS, BILITOT, PROT, ALBUMIN in the last 168 hours. No results for input(s): LIPASE, AMYLASE in the last 168 hours. No results for input(s): AMMONIA in the last 168 hours. CBC:  Recent Labs Lab 03/26/15 0243  WBC 5.8  NEUTROABS 3.2  HGB 13.4  HCT 42.1  MCV 92.9  PLT 125*   Cardiac Enzymes:  Recent Labs Lab 03/26/15 0243  TROPONINI <0.03    BNP (last 3 results)  Recent Labs  01/09/15 0103 01/10/15 0919 03/26/15 0243  BNP 168.2* 285.0* 149.3*    ProBNP (last 3 results)  Recent Labs  05/28/14 1508 07/10/14 0801 07/13/14 1928  PROBNP 477.3* 422.7 814.6*    CBG: No results for input(s): GLUCAP in the last 168 hours.  Radiological Exams on Admission: Dg Chest 2 View  03/26/2015   CLINICAL DATA:  Acute onset of shortness of breath. Initial encounter.  EXAM: CHEST  2 VIEW  COMPARISON:  Chest radiograph performed 01/24/2015, and CTA of the chest performed 03/24/2015  FINDINGS: The lungs are well-aerated. Mild central bibasilar airspace opacities likely reflect atelectasis, though mild pneumonia might have a similar appearance. Pulmonary vascularity is at the upper limits of normal. There is no evidence of pleural effusion or pneumothorax.  The heart is borderline normal in size. No acute osseous abnormalities are seen.  IMPRESSION: Mild central bibasilar airspace opacities  likely reflect atelectasis, though mild pneumonia might have a similar appearance.   Electronically Signed   By: Roanna Raider M.D.   On: 03/26/2015 03:19   Ct  Coronary Morp W/cta Cor W/score W/ca W/cm &/or Wo/cm  03/24/2015   ADDENDUM REPORT: 03/24/2015 12:18  CLINICAL DATA:  Aortic stenosis  EXAM: Cardiac TAVR CT  TECHNIQUE: The patient was scanned on a Philips 256 scanner. A 120 kV retrospective scan was triggered in the descending thoracic aorta at 111 HU's. Gantry rotation speed was 270 msecs and collimation was .9 mm. No beta blockade or nitro were given. The 3D data set was reconstructed in 5% intervals of the R-R cycle. Systolic and diastolic phases were analyzed on a dedicated work station using MPR, MIP and VRT modes. The patient received 80 cc of contrast.  FINDINGS: Aortic Valve:  Trileaflet and heavily calcified  Aorta: Normal arch vessel take off no coarctation No contraindications to stented valve delivery  Sinotubular Junction:  27 mm  Ascending Thoracic Aorta:  32 mm  Sinus of Valsalva Measurements:  Non-coronary:  29.2  Right coronary: 28  Left coronary: 29.5  Coronary Artery Height above Annulus:  Left Main:  13 mm  Right Coronary:  13.5 mm  Virtual Basal Annulus Measurements:  Maximum/Minimum Diameter:  25.8 x 20.5  Area:  416 mm2  Coronary Arteries:  Adequate height above annulus for deployment  Optimum Fluoroscopic Angle for Delivery: LAO 10 degrees Cranial 1 degree  IMPRESSION: 1) Calcified trileaflet aortic valve suitable for under inflated 26 mm Sapien 3 valve  2) Suitable coronary artery height for deployment  3) Nodular annular calcification at base of non coronary cusp and dense subannular calcification along the anterior mitral leaflet may increase risk of para valvular regurgitation  4) Optimum angiographic angle for deployment LAO 10 degrees Cranial 1 degree  Charlton Haws   Electronically Signed   By: Charlton Haws M.D.   On: 03/24/2015 12:18   03/24/2015   EXAM: OVER-READ INTERPRETATION  CT CHEST  The following report is an over-read performed by radiologist Dr. Royal Piedra [facility] on 03/24/2015. This over-read does  not include interpretation of cardiac or coronary anatomy or pathology. The coronary calcium score/coronary CTA interpretation by the cardiologist is attached.  COMPARISON:  Chest CT 05/28/2006. CT of the abdomen and pelvis 01/09/2015.  FINDINGS: Linear scarring in the right upper lobe. Small calcified granulomas in the lower lobes of the lungs bilaterally. Within the visualized portions of the thorax there are no other suspicious appearing pulmonary nodules or masses, there is no acute consolidative airspace disease, no pleural effusions and no pneumothorax. Multiple borderline enlarged and mildly enlarged mediastinal and hilar lymph nodes are noted, measuring up to 14 mm in short axis in the subcarinal nodal station. Visualized portions of the upper abdomen are unremarkable. There are no aggressive appearing lytic or blastic lesions noted in the visualized portions of the skeleton.  IMPRESSION: 1. Multiple borderline enlarged and mildly enlarged mediastinal and hilar lymph nodes bilaterally. This is of uncertain etiology and significance, but is new compared to remote prior study from 05/28/2006 and could be seen in the setting of a systemic disease such as sarcoidosis, or could be seen in the setting of a lymphoproliferative disorder. Clinical correlation is suggested.  Electronically Signed: By: Trudie Reed M.D. On: 03/24/2015 09:23    EKG: Independently reviewed. Normal sinus rhythm. Poor quality.  Assessment/Plan Active Problems:   Obstructive sleep apnea, adult   Aortic stenosis  CAD (coronary artery disease)   CKD (chronic kidney disease), stage III   Acute respiratory failure with hypoxia   Acute respiratory failure   1. Acute respiratory failure with hypoxia - suspect most likely secondary to symptomatic aortic stenosis. Since patient's x-ray was showing possible infiltrates antibiotics were empirically started for possible healthcare associated pneumonia which can be stopped if  pro-calcitonin levels are negative. Check d-dimer and troponins. Patient is on Lasix 40 mg by mouth daily which will be continued. I have consulted cardiologist for further recommendations. 2. CAD status post stenting - cycle cardiac markers. On aspirin and Plavix and metoprolol. Intolerant to statins. 3. OSA on CPAP. 4. Chronic kidney disease stage III - creatinine appears to be at baseline. 5. Recent admission in June for staph bacteremia and had completed course with vancomycin. 6. History of staph pericarditis status post pericardial window. 7. History of gout on allopurinol. 8. BPH on tamsulosin.  I have reviewed patient's old charts and labs. I have consulted on-call cardiologist.   DVT Prophylaxis Lovenox.  Code Status: Full code.  Family Communication: Discussed with patient.  Disposition Plan: Admit to inpatient.    Keyoni Lapinski N. [facility] Pager [phone].  If 7PM-7AM, please contact night-coverage [email or web address] Password [facility] 03/26/2015, 6:37 AM

## 2015-03-26 NOTE — Progress Notes (Signed)
Patient placed on CPAP of 10. O2 bleed in of 2L. Patient tolerating well. Sat 98%. Rt will continue to monitor as needed.

## 2015-03-26 NOTE — Care Management Note (Signed)
Case Management Note  Patient Details  Name: Yonathan Demke MRN: [id number] Date of Birth: [birth year]  Subjective/Objective:     Patient lives alone, he is active with [facility] for wound care.   Patient also goes to the wound clinic at [facility] once a week.  Will resume HHRN with [facility].  NCM will cont to follow for dc needs.  Patient has insurance and transportation at discharge.               Action/Plan:   Expected Discharge Date:                  Expected Discharge Plan:  Home w Home Health Services  In-House Referral:     Discharge planning Services  CM Consult  Post Acute Care Choice:  Home Health, Resumption of Svcs/PTA Provider Choice offered to:     DME Arranged:    DME Agency:     HH Arranged:  RN HH Agency:     Status of Service:  In process, will continue to follow  Medicare Important Message Given:    Date Medicare IM Given:    Medicare IM give by:    Date Additional Medicare IM Given:    Additional Medicare Important Message give by:     If discussed at Long Length of Stay Meetings, dates discussed:    Additional Comments:  Leone Haven, RN 03/26/2015, 12:24 PM

## 2015-03-27 DIAGNOSIS — I359 Nonrheumatic aortic valve disorder, unspecified: Secondary | ICD-10-CM

## 2015-03-27 DIAGNOSIS — G4733 Obstructive sleep apnea (adult) (pediatric): Secondary | ICD-10-CM

## 2015-03-27 DIAGNOSIS — J9601 Acute respiratory failure with hypoxia: Secondary | ICD-10-CM

## 2015-03-27 DIAGNOSIS — N183 Chronic kidney disease, stage 3 (moderate): Secondary | ICD-10-CM

## 2015-03-27 LAB — BASIC METABOLIC PANEL
Anion gap: 8 (ref 5–15)
BUN: 15 mg/dL (ref 6–20)
CALCIUM: 9 mg/dL (ref 8.9–10.3)
CO2: 30 mmol/L (ref 22–32)
CREATININE: 1.37 mg/dL — AB (ref 0.61–1.24)
Chloride: 100 mmol/L — ABNORMAL LOW (ref 101–111)
GFR calc Af Amer: 55 mL/min — ABNORMAL LOW (ref >60)
GFR calc non Af Amer: 47 mL/min — ABNORMAL LOW (ref >60)
GLUCOSE: 95 mg/dL (ref 65–99)
Potassium: 4 mmol/L (ref 3.5–5.1)
Sodium: 138 mmol/L (ref 135–145)

## 2015-03-27 LAB — GLUCOSE, CAPILLARY
Glucose-Capillary: 88 mg/dL (ref 65–99)
Glucose-Capillary: 146 mg/dL — ABNORMAL HIGH (ref 65–99)

## 2015-03-27 LAB — HEMOGLOBIN A1C
HEMOGLOBIN A1C: 6.1 % — AB (ref 4.8–5.6)
Mean Plasma Glucose: 128 mg/dL

## 2015-03-27 MED ORDER — FUROSEMIDE 10 MG/ML IJ SOLN
20.0000 mg | Freq: Once | INTRAMUSCULAR | Status: DC
Start: 2015-03-27 — End: 2015-03-27

## 2015-03-27 NOTE — Evaluation (Signed)
Physical Therapy Evaluation Patient Details Name: [name] MRN: [id number] DOB: 1933-01-20 Today's Date: 03/27/2015   History of Present Illness  Pt is a 81 y.o. male with history of symptomatic aortic stenosis and has had recent cath, recently admitted in June for staph bacteremia, CAD status post stenting, staph pericarditis status post pericardial window, hypertension, chronic kidney disease stage III and OSA presented to the ER because of worsening shortness of breath. Patient was admitted for further management of his acute respiratory failure  Clinical Impression  Patient overall did pretty well with mobility.  Mostly limited by balance issues, most likely due to decreased activity over past few days.  Feel patient will steadily improved with increased mobility.  Did recommend patient use cane at discharge for improved balance.  Feel patient will benefit from continued PT to progress mobility and increase balance to reduce risk of falls.  Recommend HHPT at discharge for follow up.     Follow Up Recommendations Home health PT;Supervision - Intermittent    Equipment Recommendations  None recommended by PT    Recommendations for Other Services       Precautions / Restrictions Precautions Precautions: Fall      Mobility  Bed Mobility               General bed mobility comments: patient in recliner upon arrival  Transfers Overall transfer level: Needs assistance Equipment used: Rolling walker (2 wheeled) Transfers: Sit to/from Stand Sit to Stand: Min guard            Ambulation/Gait Ambulation/Gait assistance: Min guard Ambulation Distance (Feet): 200 Feet Assistive device: None Gait Pattern/deviations: WFL(Within Functional Limits)        Stairs            Wheelchair Mobility    Modified Rankin (Stroke Patients Only)       Balance Overall balance assessment: Needs assistance         Standing balance support: No upper extremity  supported Standing balance-Leahy Scale: Fair                               Pertinent Vitals/Pain Pain Assessment: No/denies pain    Home Living Family/patient expects to be discharged to:: Private residence Living Arrangements: Alone Available Help at Discharge: Family;Available PRN/intermittently Type of Home: House Home Access: Ramped entrance     Home Layout: One level Home Equipment: Walker - 2 wheels;Cane - single point;Bedside commode      Prior Function Level of Independence: Independent         Comments: lives alone, son next door; yard work, driving, "he's very independent"     Hand Dominance   Dominant Hand: Right    Extremity/Trunk Assessment   Upper Extremity Assessment: Overall WFL for tasks assessed           Lower Extremity Assessment: Overall WFL for tasks assessed;LLE deficits/detail   LLE Deficits / Details: limited ROM left ankle (due to "arthritis" per patient)     Communication   Communication: No difficulties  Cognition Arousal/Alertness: Awake/alert Behavior During Therapy: WFL for tasks assessed/performed Overall Cognitive Status: Within Functional Limits for tasks assessed                      General Comments      Exercises General Exercises - Lower Extremity Ankle Circles/Pumps: AROM;10 reps;Both;Seated;Strengthening Long Arc Quad: AROM;Strengthening;10 reps;Both;Seated Hip Flexion/Marching: AROM;Strengthening;10 reps;Both;Seated  Assessment/Plan    PT Assessment Patient needs continued PT services  PT Diagnosis Generalized weakness   PT Problem List Decreased balance;Decreased activity tolerance  PT Treatment Interventions DME instruction;Gait training;Stair training;Therapeutic activities;Balance training;Patient/family education   PT Goals (Current goals can be found in the Care Plan section) Acute Rehab PT Goals Patient Stated Goal: go home PT Goal Formulation: With patient Time For Goal  Achievement: 04/03/15 Potential to Achieve Goals: Good    Frequency Min 3X/week   Barriers to discharge Decreased caregiver support lives alone    Co-evaluation               End of Session Equipment Utilized During Treatment: Gait belt Activity Tolerance: Patient tolerated treatment well Patient left: in chair;with call bell/phone within reach           Time: 1045-1105 PT Time Calculation (min) (ACUTE ONLY): 20 min   Charges:   PT Evaluation $Initial PT Evaluation Tier I: 1 Procedure       PT G CodesOlivia Canter 03/27/2015, 12:25 PM  03/27/2015 Corlis Hove, PT [phone]

## 2015-03-27 NOTE — Progress Notes (Signed)
[name] be D/C'd Home per MD order. Discussed with the patient and all questions fully answered.  VSS, Skin clean, dry and intact without evidence of skin break down, no evidence of skin tears noted. IV catheter discontinued intact. Site without signs and symptoms of complications. Dressing and pressure applied.  An After Visit Summary was printed and given to the patient.   D/c education completed with patient/family including follow up instructions, medication list, d/c activities limitations if indicated, with other d/c instructions as indicated by MD - patient able to verbalize understanding, all questions fully answered.   Patient instructed to return to ED, call 911, or call MD for any changes in condition.   Patient escorted via wheelchair with nurse tech, and D/C home via private auto with significant other.

## 2015-03-27 NOTE — Progress Notes (Signed)
Patient Name: [name] Date of Encounter: 03/27/2015  Active Problems:   Obstructive sleep apnea, adult   Aortic stenosis   CAD (coronary artery disease)   CKD (chronic kidney disease), stage III   Acute respiratory failure with hypoxia   Acute respiratory failure   Length of Stay: 1  SUBJECTIVE  Not much change - no net diuresis and weight the same. He feels much better. Walked to nurses station and back without dyspnea.  CURRENT MEDS . allopurinol  300 mg Oral q morning - 10a  . aspirin EC  81 mg Oral Daily  . buPROPion  150 mg Oral q morning - 10a  . clopidogrel  75 mg Oral Daily  . docusate sodium  100 mg Oral BID  . enoxaparin (LOVENOX) injection  40 mg Subcutaneous Daily  . ferrous gluconate  324 mg Oral Q breakfast  . furosemide  40 mg Oral Daily  . magnesium oxide  400 mg Oral Daily  . metoprolol succinate  50 mg Oral Daily  . mirabegron ER  50 mg Oral Daily  . mirtazapine  15 mg Oral QHS  . pantoprazole  40 mg Oral BID  . pregabalin  200 mg Oral QHS  . rOPINIRole  4 mg Oral QHS  . sodium chloride  3 mL Intravenous Q12H  . tamsulosin  0.4 mg Oral Daily    OBJECTIVE   Intake/Output Summary (Last 24 hours) at 03/27/15 1212 Last data filed at 03/27/15 0856  Gross per 24 hour  Intake    422 ml  Output      0 ml  Net    422 ml   Filed Weights   03/26/15 0821 03/27/15 0535  Weight: 235 lb 10.8 oz (106.9 kg) 237 lb 3.4 oz (107.6 kg)    PHYSICAL EXAM Filed Vitals:   03/26/15 2137 03/26/15 2200 03/27/15 0535 03/27/15 1055  BP: 140/47  138/57 138/56  Pulse: 65 68 59 55  Temp: 97.7 F (36.5 C)  97.6 F (36.4 C)   TempSrc: Oral  Oral   Resp: 18 18 18    Height:      Weight:   237 lb 3.4 oz (107.6 kg)   SpO2: 97% 98% 92%    General: Alert, oriented x3, no distress, obesity does limit his exam to some degree Head: no evidence of trauma, PERRL, EOMI, no exophtalmos or lid lag, no myxedema, no xanthelasma; normal ears, nose and oropharynx Neck:  normal jugular venous pulsations and no hepatojugular reflux; brisk carotid pulses without delay and no carotid bruits Chest: clear to auscultation, no signs of consolidation by percussion or palpation, normal fremitus, symmetrical and full respiratory excursions Cardiovascular: normal position and quality of the apical impulse, regular rhythm, normal first heart sound and very weak aortic component of the second heart sound, no rubs or gallops, 3/6 mid-late peaking systolic murmur in the aortic focus radiating broadly through the chest, no diastolic murmur Abdomen: no tenderness or distention, no masses by palpation, no abnormal pulsatility or arterial bruits, normal bowel sounds, no hepatosplenomegaly Extremities: no clubbing, cyanosis; 2-3 plus bilateral symmetrical calf and pedal edema; 2+ radial, ulnar and brachial pulses bilaterally; 2+ right femoral, posterior tibial and dorsalis pedis pulses; 2+ left femoral, posterior tibial and dorsalis pedis pulses; no subclavian or femoral bruits Neurological: grossly nonfocal  LABS  CBC  Recent Labs  03/26/15 0243 03/26/15 1002  WBC 5.8 6.8  NEUTROABS 3.2 4.9  HGB 13.4 12.9*  HCT 42.1 40.3  MCV 92.9 93.1  PLT 125* 121*   Basic Metabolic Panel  Recent Labs  03/26/15 1002 03/27/15 0434  NA 139 138  K 3.7 4.0  CL 104 100*  CO2 27 30  GLUCOSE 111* 95  BUN 11 15  CREATININE 1.32* 1.37*  CALCIUM 8.8* 9.0   Liver Function Tests  Recent Labs  03/26/15 1002  AST 30  ALT 20  ALKPHOS 73  BILITOT 0.9  PROT 6.7  ALBUMIN 3.5   No results for input(s): LIPASE, AMYLASE in the last 72 hours. Cardiac Enzymes  Recent Labs  03/26/15 1002 03/26/15 1530 03/26/15 2035  TROPONINI <0.03 <0.03 <0.03   BNP Invalid input(s): POCBNP D-Dimer  Recent Labs  03/26/15 1002  DDIMER 0.57*   Hemoglobin A1C  Recent Labs  03/26/15 1002  HGBA1C 6.1*   Fasting Lipid Panel No results for input(s): CHOL, HDL, LDLCALC, TRIG, CHOLHDL,  LDLDIRECT in the last 72 hours. Thyroid Function Tests No results for input(s): TSH, T4TOTAL, T3FREE, THYROIDAB in the last 72 hours.  Invalid input(s): FREET3  Radiology Studies Imaging results have been reviewed and Dg Chest 2 View  03/26/2015   CLINICAL DATA:  Acute onset of shortness of breath. Initial encounter.  EXAM: CHEST  2 VIEW  COMPARISON:  Chest radiograph performed 01/24/2015, and CTA of the chest performed 03/24/2015  FINDINGS: The lungs are well-aerated. Mild central bibasilar airspace opacities likely reflect atelectasis, though mild pneumonia might have a similar appearance. Pulmonary vascularity is at the upper limits of normal. There is no evidence of pleural effusion or pneumothorax.  The heart is borderline normal in size. No acute osseous abnormalities are seen.  IMPRESSION: Mild central bibasilar airspace opacities likely reflect atelectasis, though mild pneumonia might have a similar appearance.   Electronically Signed   By: Roanna Raider M.D.   On: 03/26/2015 03:19    TELE NSR   ASSESSMENT AND PLAN  Despite absence of major diuresis, he feels much better. I asked Dr. Excell Seltzer to see if abdomen-pelvis CTA from June would be satisfactory or if he still needs to get the one scheduled on Wednesday. Will try to move up his surgical evaluation to this Tuesday with Dr. Cornelius Moras. I think he can go home today. Reinforced need to be very disciplined with dietary sodium restriction and to weigh daily.  Thurmon Fair, MD, [facility] [phone] office [phone] pager 03/27/2015 12:12 PM

## 2015-03-27 NOTE — Discharge Summary (Signed)
Physician Discharge Summary  [name] IOX:[id number] DOB: [name] DOA: 03/26/2015  PCP: [name], MD  Admit date: 03/26/2015 Discharge date: 03/27/2015  Time spent: 20 minutes  Recommendations for Outpatient Follow-up:  1. Follow up with PCP in 1-2 weeks 2. Follow up with CT surgery as scheduled  Discharge Diagnoses:  Active Problems:   Obstructive sleep apnea, adult   Aortic stenosis   CAD (coronary artery disease)   CKD (chronic kidney disease), stage III   Acute respiratory failure with hypoxia   Acute respiratory failure   Discharge Condition: Improved  Diet recommendation: Heart healthy  Filed Weights   03/26/15 0821 03/27/15 0535  Weight: 106.9 kg (235 lb 10.8 oz) 107.6 kg (237 lb 3.4 oz)    History of present illness:  Please see admit h and p from 9/2 for details. Briefly, pt presented with increased sob with hypoxia and findings worrisome for volume overload in the setting of known aortic stenosis and CAD. The patient was admitted for further work up.   Hospital Course:  1. Acute respiratory failure with hypoxia - likely secondary to symptomatic aortic stenosis. Patient's initial chest x-ray w/ findings of possible infiltrates, thus antibiotics were empirically started for possible healthcare. Pro-calcitonin levels were found to be negative, thus stopped abx. Troponin's were serially neg. Patient was continued on Lasix 40 mg po qday. Cardiology was consulted. Recs for hopefully moving up surgical eval for AS.By 9/3, the patient reported feeling much better. Pt was cleared for discharge 2. CAD status post stenting - cycled cardiac markers neg. On aspirin and Plavix and metoprolol. Pt is intolerant to statins. 3. OSA on CPAP. 4. Chronic kidney disease stage III - creatinine appears to be at baseline. 5. Recent admission in June for staph bacteremia and had completed course with vancomycin. Stable. Afebrile 6. History of staph pericarditis status post  pericardial window. 7. History of gout on allopurinol. 8. BPH on tamsulosin.  Consultations:  Cardiology - Dr. Royann Shivers  Discharge Exam: Filed Vitals:   03/26/15 2200 03/27/15 0535 03/27/15 1055 03/27/15 1220  BP:  138/57 138/56   Pulse: 68 59 55   Temp:  97.6 F (36.4 C)    TempSrc:  Oral    Resp: 18 18    Height:      Weight:  107.6 kg (237 lb 3.4 oz)    SpO2: 98% 92%  98%    General: awake, in nad Cardiovascular: regular, s1, s2 Respiratory: normal resp effort, no wheezing  Discharge Instructions     Medication List    TAKE these medications        acetaminophen 325 MG tablet  Commonly known as:  TYLENOL  Take 2 tablets (650 mg total) by mouth every 6 (six) hours as needed for mild pain or moderate pain.     allopurinol 300 MG tablet  Commonly known as:  ZYLOPRIM  Take 1 tablet (300 mg total) by mouth every morning.     amLODipine 5 MG tablet  Commonly known as:  NORVASC  Take 5 mg by mouth daily as needed (Pt states he only takes if Heart Rate is high).     aspirin EC 81 MG tablet  Take 81 mg by mouth daily.     buPROPion 150 MG 24 hr tablet  Commonly known as:  WELLBUTRIN XL  Take 1 tablet (150 mg total) by mouth every morning.     cholecalciferol 1000 UNITS tablet  Commonly known as:  VITAMIN D  Take 1,000 Units by  mouth daily.     clopidogrel 75 MG tablet  Commonly known as:  PLAVIX  Take 1 tablet (75 mg total) by mouth daily.     DSS 100 MG Caps  Take 100 mg by mouth 2 (two) times daily.     Ferrous Gluconate 256 (28 FE) MG Tabs  Take 256 mg by mouth daily.     furosemide 40 MG tablet  Commonly known as:  LASIX  Take 1 tablet (40 mg total) by mouth daily.     LORazepam 1 MG tablet  Commonly known as:  ATIVAN  TAKE ONE TABLET BY MOUTH EVERY 8 HOURS AS NEEDED FOR ANXIETY/SLEEP     Magnesium 250 MG Tabs  Take 250 mg by mouth daily.     metoprolol succinate 50 MG 24 hr tablet  Commonly known as:  TOPROL-XL  Take 1 tablet (50 mg  total) by mouth daily. Take with or immediately following a meal.     mirabegron ER 50 MG Tb24 tablet  Commonly known as:  MYRBETRIQ  Take 1 tablet (50 mg total) by mouth daily.     mirtazapine 15 MG tablet  Commonly known as:  REMERON  Take 1 tablet (15 mg total) by mouth at bedtime.     pantoprazole 40 MG tablet  Commonly known as:  PROTONIX  Take 1 tablet (40 mg total) by mouth 2 (two) times daily.     pregabalin 200 MG capsule  Commonly known as:  LYRICA  Take 1 capsule (200 mg total) by mouth at bedtime.     rOPINIRole 4 MG tablet  Commonly known as:  REQUIP  Take 1 tablet (4 mg total) by mouth at bedtime.     tamsulosin 0.4 MG Caps capsule  Commonly known as:  FLOMAX  Take 1 capsule (0.4 mg total) by mouth daily.       Allergies  Allergen Reactions  . Statins Other (See Comments)    Severe leg myalgias, weakness   Follow-up Information    Follow up with [facility].   Why:  Resume HHRN for wound care   Contact information:   [address] [place] [address] [phone]       Follow up with [name], MD. Schedule an appointment as soon as possible for a visit in 1 week.   Specialty:  Internal Medicine   Why:  Hospital follow up   Contact information:   [address] [place] [address] [phone]       Schedule an appointment as soon as possible for a visit with Purcell Nails, MD.   Specialty:  Cardiothoracic Surgery   Why:  Hospital follow up   Contact information:   [address] [place] [address] [phone]        The results of significant diagnostics from this hospitalization (including imaging, microbiology, ancillary and laboratory) are listed below for reference.    Significant Diagnostic Studies: Dg Chest 2 View  03/26/2015   CLINICAL DATA:  Acute onset of shortness of breath. Initial encounter.  EXAM: CHEST  2 VIEW  COMPARISON:  Chest radiograph  performed 01/24/2015, and CTA of the chest performed 03/24/2015  FINDINGS: The lungs are well-aerated. Mild central bibasilar airspace opacities likely reflect atelectasis, though mild pneumonia might have a similar appearance. Pulmonary vascularity is at the upper limits of normal. There is no evidence of pleural effusion or pneumothorax.  The heart is  borderline normal in size. No acute osseous abnormalities are seen.  IMPRESSION: Mild central bibasilar airspace opacities likely reflect atelectasis, though mild pneumonia might have a similar appearance.   Electronically Signed   By: Roanna Raider M.D.   On: 03/26/2015 03:19   Ct Coronary Morp W/cta Cor W/score W/ca W/cm &/or Wo/cm  03/24/2015   ADDENDUM REPORT: 03/24/2015 12:18  CLINICAL DATA:  Aortic stenosis  EXAM: Cardiac TAVR CT  TECHNIQUE: The patient was scanned on a Philips 256 scanner. A 120 kV retrospective scan was triggered in the descending thoracic aorta at 111 HU's. Gantry rotation speed was 270 msecs and collimation was .9 mm. No beta blockade or nitro were given. The 3D data set was reconstructed in 5% intervals of the R-R cycle. Systolic and diastolic phases were analyzed on a dedicated work station using MPR, MIP and VRT modes. The patient received 80 cc of contrast.  FINDINGS: Aortic Valve:  Trileaflet and heavily calcified  Aorta: Normal arch vessel take off no coarctation No contraindications to stented valve delivery  Sinotubular Junction:  27 mm  Ascending Thoracic Aorta:  32 mm  Sinus of Valsalva Measurements:  Non-coronary:  29.2  Right coronary: 28  Left coronary: 29.5  Coronary Artery Height above Annulus:  Left Main:  13 mm  Right Coronary:  13.5 mm  Virtual Basal Annulus Measurements:  Maximum/Minimum Diameter:  25.8 x 20.5  Area:  416 mm2  Coronary Arteries:  Adequate height above annulus for deployment  Optimum Fluoroscopic Angle for Delivery: LAO 10 degrees Cranial 1 degree  IMPRESSION: 1) Calcified trileaflet aortic valve  suitable for under inflated 26 mm Sapien 3 valve  2) Suitable coronary artery height for deployment  3) Nodular annular calcification at base of non coronary cusp and dense subannular calcification along the anterior mitral leaflet may increase risk of para valvular regurgitation  4) Optimum angiographic angle for deployment LAO 10 degrees Cranial 1 degree  Charlton Haws   Electronically Signed   By: Charlton Haws M.D.   On: 03/24/2015 12:18   03/24/2015   EXAM: OVER-READ INTERPRETATION  CT CHEST  The following report is an over-read performed by radiologist Dr. Royal Piedra [facility] on 03/24/2015. This over-read does not include interpretation of cardiac or coronary anatomy or pathology. The coronary calcium score/coronary CTA interpretation by the cardiologist is attached.  COMPARISON:  Chest CT 05/28/2006. CT of the abdomen and pelvis 01/09/2015.  FINDINGS: Linear scarring in the right upper lobe. Small calcified granulomas in the lower lobes of the lungs bilaterally. Within the visualized portions of the thorax there are no other suspicious appearing pulmonary nodules or masses, there is no acute consolidative airspace disease, no pleural effusions and no pneumothorax. Multiple borderline enlarged and mildly enlarged mediastinal and hilar lymph nodes are noted, measuring up to 14 mm in short axis in the subcarinal nodal station. Visualized portions of the upper abdomen are unremarkable. There are no aggressive appearing lytic or blastic lesions noted in the visualized portions of the skeleton.  IMPRESSION: 1. Multiple borderline enlarged and mildly enlarged mediastinal and hilar lymph nodes bilaterally. This is of uncertain etiology and significance, but is new compared to remote prior study from 05/28/2006 and could be seen in the setting of a systemic disease such as sarcoidosis, or could be seen in the setting of a lymphoproliferative disorder. Clinical correlation is suggested.   Electronically Signed: By: Trudie Reed M.D. On: 03/24/2015 09:23    Microbiology: Recent Results (from the past  240 hour(s))  Culture, blood (single)     Status: None (Preliminary result)   Collection Time: 03/22/15 11:00 AM  Result Value Ref Range Status   Preliminary Report Blood Culture received; No Growth to date;  Preliminary   Preliminary Report Culture will be held for 5 days before issuing  Preliminary    Comment: Culture results may be compromised due to an excessive volume of blood received in culture bottles.    Preliminary Report a Final Negative report.  Preliminary  Culture, blood (single)     Status: None (Preliminary result)   Collection Time: 03/22/15 11:00 AM  Result Value Ref Range Status   Preliminary Report Blood Culture received; No Growth to date;  Preliminary   Preliminary Report Culture will be held for 5 days before issuing  Preliminary    Comment: Culture results may be compromised due to an excessive volume of blood received in culture bottles.    Preliminary Report a Final Negative report.  Preliminary     Labs: Basic Metabolic Panel:  Recent Labs Lab 03/26/15 0243 03/26/15 1002 03/27/15 0434  NA 139 139 138  K 3.9 3.7 4.0  CL 104 104 100*  CO2 27 27 30   GLUCOSE 98 111* 95  BUN 13 11 15   CREATININE 1.40* 1.32* 1.37*  CALCIUM 9.1 8.8* 9.0   Liver Function Tests:  Recent Labs Lab 03/26/15 1002  AST 30  ALT 20  ALKPHOS 73  BILITOT 0.9  PROT 6.7  ALBUMIN 3.5   No results for input(s): LIPASE, AMYLASE in the last 168 hours. No results for input(s): AMMONIA in the last 168 hours. CBC:  Recent Labs Lab 03/26/15 0243 03/26/15 1002  WBC 5.8 6.8  NEUTROABS 3.2 4.9  HGB 13.4 12.9*  HCT 42.1 40.3  MCV 92.9 93.1  PLT 125* 121*   Cardiac Enzymes:  Recent Labs Lab 03/26/15 0243 03/26/15 1002 03/26/15 1530 03/26/15 2035  TROPONINI <0.03 <0.03 <0.03 <0.03   BNP: BNP (last 3 results)  Recent Labs  01/09/15 0103  01/10/15 0919 03/26/15 0243  BNP 168.2* 285.0* 149.3*    ProBNP (last 3 results)  Recent Labs  05/28/14 1508 07/10/14 0801 07/13/14 1928  PROBNP 477.3* 422.7 814.6*    CBG:  Recent Labs Lab 03/26/15 1210 03/26/15 1655 03/26/15 2204 03/27/15 0746 03/27/15 1130  GLUCAP 110* 116* 146* 88 146*     Signed:  Leronda Lewers K  [facility] 03/27/2015, 6:13 PM

## 2015-03-28 LAB — CULTURE, BLOOD (SINGLE)
Organism ID, Bacteria: NO GROWTH
Organism ID, Bacteria: NO GROWTH

## 2015-03-30 ENCOUNTER — Encounter: Payer: Self-pay | Admitting: Thoracic Surgery (Cardiothoracic Vascular Surgery)

## 2015-03-30 ENCOUNTER — Institutional Professional Consult (permissible substitution) (INDEPENDENT_AMBULATORY_CARE_PROVIDER_SITE_OTHER): Payer: Medicare Other | Admitting: Thoracic Surgery (Cardiothoracic Vascular Surgery)

## 2015-03-30 VITALS — BP 137/62 | HR 68 | Resp 20 | Ht 70.0 in | Wt 230.0 lb

## 2015-03-30 DIAGNOSIS — I2510 Atherosclerotic heart disease of native coronary artery without angina pectoris: Secondary | ICD-10-CM | POA: Diagnosis not present

## 2015-03-30 DIAGNOSIS — I359 Nonrheumatic aortic valve disorder, unspecified: Secondary | ICD-10-CM | POA: Diagnosis not present

## 2015-03-30 DIAGNOSIS — I5032 Chronic diastolic (congestive) heart failure: Secondary | ICD-10-CM

## 2015-03-30 DIAGNOSIS — I350 Nonrheumatic aortic (valve) stenosis: Secondary | ICD-10-CM | POA: Diagnosis not present

## 2015-03-30 DIAGNOSIS — I2583 Coronary atherosclerosis due to lipid rich plaque: Secondary | ICD-10-CM

## 2015-03-30 DIAGNOSIS — R0600 Dyspnea, unspecified: Secondary | ICD-10-CM | POA: Diagnosis not present

## 2015-03-30 DIAGNOSIS — I6523 Occlusion and stenosis of bilateral carotid arteries: Secondary | ICD-10-CM | POA: Diagnosis not present

## 2015-03-30 NOTE — Progress Notes (Signed)
[facility]  MULTIDISCIPLINARY HEART VALVE CLINIC   CARDIOTHORACIC SURGERY CONSULTATION REPORT  Referring Provider is Othella Boyer, MD PCP is [name], MD  Chief Complaint  Patient presents with  . Shortness of Breath    ECHO, 01/09/15, CATH 03/15/15, PFT  and CT'S scheduled for 03/31/15 ans visit with Dr. Laneta Simmers on 04/07/15  . Congestive Heart Failure    chronic  . Aortic Stenosis    severe    HPI:  Patient is an 81 year old male with history of aortic stenosis, coronary artery disease, hypertension, type 2 diabetes mellitus, obesity, stage III chronic kidney disease, and obstructive sleep apnea who has been referred for surgical consultation to discuss treatment options for management of severe aortic stenosis. The patient has been followed by Dr. Donnie Aho for many years. He has suffered 2 previous myocardial infarctions in the remote past and treated with PCI and stenting of the first obtuse marginal branch of the left circumflex coronary artery, most recently in 2010. He has developed aortic stenosis which has gradually progressed on follow-up echocardiograms.  The patient states that he has done well until the past year.  He has been hospitalized a total of 3 occasions with acute exacerbation of chronic diastolic congestive heart failure beginning in November 2015. He had a prolonged hospitalization from 01/09/2015 to 01/28/2015 with another acute exacerbation of chronic diastolic congestive heart failure complicated by hypoxemic respiratory failure requiring a fairly prolonged course of mechanical ventilation.  Transthoracic echocardiogram performed at that time demonstrated the presence of moderate to severe aortic stenosis with peak velocity across the aortic valve measured 3.9 m/s corresponding to mean transvalvular gradient estimated at 32 mmHg.  Left ventricular systolic function was preserved with ejection fraction estimated at 55-60%.  During that  hospitalization he developed febrile illness associated with coag-negative Staphylococcus bacteremia for which he was treated with a 4 week course of intravenous antibiotics.  The patient never had a transesophageal echocardiogram performed, although transthoracic echocardiograms did not reveal any findings to suggest the presence of bacterial endocarditis. Blood cultures cleared on anabiotic therapy and subsequent follow-up blood cultures performed after completion of treatment were negative.  The patient gradually recovered from his prolonged hospitalization and spent 3 weeks at a skilled nursing facility in July. He eventually returned to independent living and was referred to Dr. Excell Seltzer who saw him in consultation on 03/12/2015 and scheduled the patient for diagnostic cardiac catheterization.  Catheterization confirmed the presence of severe aortic stenosis with mean transvalvular gradient measured 37.9 mmHg corresponding to aortic valve area calculated 0.83 cm. The patient had moderate multivessel coronary artery disease with long segment 60-70% stenosis of the mid right coronary artery. There was moderate to severe calcification of the proximal left anterior descending coronary artery but no significant flow-limiting stenosis. There was 50% ostial stenosis of the left circumflex coronary artery with continued patency in the stented segment of the large first obtuse marginal branch.  The patient was referred for elective surgical consultation, but prior to his visit in our office he was again hospitalized briefly last week with another acute exacerbation of chronic diastolic congestive heart failure. This time he was stabilized quickly and discharged home in less than 48 hours.  He presents for elective surgical consultation to discuss treatment options for management of severe aortic stenosis.  The patient has been a widower for the past 5 years and currently lives alone in [place]. He has 5 grown  children, and one of his sons lives right next  door. The patient has been retired since 2000, having previously worked Investment banker, corporate for [employer].  He still enjoys woodworking and working in his shop. However, the patient states that he has not been able to do much of anything over the past year. He has been limited primarily by shortness of breath, and he remains extremely limited with frequent episodes of shortness of breath at rest and with minimal physical activity. He denies any history of chest pain or chest tightness. He cannot lay flat in bed and using his CPAP mask seems to make him feel worse. He has palpitations. He has had occasional dizzy spells without syncope. He has not had significant lower extremity edema.   Past Medical History  Diagnosis Date  . Gout, unspecified     severe dz, "treatment done that last 15 years"  . Obesity (BMI 30-39.9)   . Restless leg syndrome   . History of retinal detachment 1994  . Hypertensive heart disease   . CAD (coronary artery disease), native coronary artery   . History of pericarditis 2007    MSSA, s/p pericardial window  . GERD   . Osteoarthritis   . Sleep apnea in adult     CPAP qhs  . TIA (transient ischemic attack) 2005  . BPH (benign prostatic hypertrophy)     "minor"  . Lumbar spinal stenosis   . Carotid artery occlusion   . Unspecified venous (peripheral) insufficiency   . Stroke May 2003  . Anxiety   . Depression   . Aortic stenosis     ECHO 05/29/14 moderate AS Mean gradient 21 mm EF 55% ECHO 07/09/13  Mean gradient 12 mm Mild to moderate AS EF 50%    . CAD (coronary artery disease)     02/11/09  Xience stent to circumflex 2.5 x 18 mm postdilated to 2.75 mm  Cath sept 2011  normal Left main, 20 % stenosis proximal LAD, 40% stenosis mid LAD, 70% stenosis proximal Diag 1, 90% stenosis prox CFX, 90% first OM, small and nondominant RCA;   . Carotid artery disease     Prior TIA 2006 with left CEA by Dr. Arbie Cookey Recurrent TIA in  April 2011   . Type 2 diabetes mellitus with vascular disease   . Lumbar disc disease   . CKD (chronic kidney disease), stage III 02/08/2014  . CHF (congestive heart failure)     Past Surgical History  Procedure Laterality Date  . Pericardial window  2007  . Left arm  2008    shoulder  . Right arm  1970's    shoulder  . Cataract extraction      Left side x's 2 and right  . Retinal detachment surgery      left side  . Carotid endarterectomy Left 12-06-05    cea  . Coronary angioplasty  01/2009    x 1 stent  . Colonoscopy    . Carpal tunnel release Bilateral   . Back surgery  2013    removed bone spurs  . Inguinal hernia repair Left 10/01/2013    Procedure: HERNIA REPAIR INGUINAL ADULT;  Surgeon: Axel Filler, MD;  Location: [facility];  Service: General;  Laterality: Left;  . Insertion of mesh Left 10/01/2013    Procedure: INSERTION OF MESH;  Surgeon: Axel Filler, MD;  Location: [facility];  Service: General;  Laterality: Left;  . Finger amputation  May 2015    JUST TO FIRST JOINT RIGHT HAND  LAST FINGER ( Right 5th finger)  .  Peripheral vascular catheterization N/A 01/08/2015    Procedure: Abdominal Aortogram;  Surgeon: Chuck Hint, MD;  Location: [facility] INVASIVE CV LAB;  Service: Cardiovascular;  Laterality: N/A;  . Cardiac catheterization N/A 03/15/2015    Procedure: Right/Left Heart Cath and Coronary Angiography;  Surgeon: Tonny Bollman, MD;  Location: [facility] INVASIVE CV LAB;  Service: Cardiovascular;  Laterality: N/A;    Family History  Problem Relation Age of Onset  . Heart disease Mother     Before age 79  . Heart attack Mother   . Diabetes Father   . Heart disease Father   . Breast cancer Sister   . Cancer Sister   . Prostate cancer Brother   . Lung cancer Brother   . Cancer Brother   . Diabetes Daughter   . Diabetes Son   . Hypertension Son     Social History   Social History  . Marital Status: Widowed    Spouse Name: N/A  . Number of Children: N/A  .  Years of Education: N/A   Occupational History  . Not on file.   Social History Main Topics  . Smoking status: Never Smoker   . Smokeless tobacco: Never Used  . Alcohol Use: No  . Drug Use: No  . Sexual Activity: Not on file   Other Topics Concern  . Not on file   Social History Narrative   Lives alone     Current Outpatient Prescriptions  Medication Sig Dispense Refill  . acetaminophen (TYLENOL) 325 MG tablet Take 2 tablets (650 mg total) by mouth every 6 (six) hours as needed for mild pain or moderate pain. (Patient taking differently: Take 325 mg by mouth every 6 (six) hours as needed for mild pain or moderate pain. )    . allopurinol (ZYLOPRIM) 300 MG tablet Take 1 tablet (300 mg total) by mouth every morning. 90 tablet 1  . amLODipine (NORVASC) 5 MG tablet Take 5 mg by mouth daily as needed (Pt states he only takes if Heart Rate is high).    Marland Kitchen aspirin EC 81 MG tablet Take 81 mg by mouth daily.    Marland Kitchen buPROPion (WELLBUTRIN XL) 150 MG 24 hr tablet Take 1 tablet (150 mg total) by mouth every morning. 90 tablet 1  . cholecalciferol (VITAMIN D) 1000 UNITS tablet Take 1,000 Units by mouth daily.    . clopidogrel (PLAVIX) 75 MG tablet Take 1 tablet (75 mg total) by mouth daily. 90 tablet 3  . docusate sodium 100 MG CAPS Take 100 mg by mouth 2 (two) times daily. 10 capsule 0  . Ferrous Gluconate 256 (28 FE) MG TABS Take 256 mg by mouth daily.    . furosemide (LASIX) 40 MG tablet Take 1 tablet (40 mg total) by mouth daily.    Marland Kitchen LORazepam (ATIVAN) 1 MG tablet TAKE ONE TABLET BY MOUTH EVERY 8 HOURS AS NEEDED FOR ANXIETY/SLEEP 30 tablet 0  . Magnesium 250 MG TABS Take 250 mg by mouth daily.     . metoprolol succinate (TOPROL-XL) 50 MG 24 hr tablet Take 1 tablet (50 mg total) by mouth daily. Take with or immediately following a meal. 90 tablet 3  . mirabegron ER (MYRBETRIQ) 50 MG TB24 tablet Take 1 tablet (50 mg total) by mouth daily. 90 tablet 1  . mirtazapine (REMERON) 15 MG tablet Take 1  tablet (15 mg total) by mouth at bedtime. 30 tablet 11  . pantoprazole (PROTONIX) 40 MG tablet Take 1 tablet (40 mg total) by  mouth 2 (two) times daily. 90 tablet 1  . pregabalin (LYRICA) 200 MG capsule Take 1 capsule (200 mg total) by mouth at bedtime. 90 capsule 1  . rOPINIRole (REQUIP) 4 MG tablet Take 1 tablet (4 mg total) by mouth at bedtime. 90 tablet 1  . tamsulosin (FLOMAX) 0.4 MG CAPS capsule Take 1 capsule (0.4 mg total) by mouth daily. 90 capsule 3   No current facility-administered medications for this visit.    Allergies  Allergen Reactions  . Statins Other (See Comments)    Severe leg myalgias, weakness      Review of Systems:   General:  normal appetite, decreased energy, no weight gain, no weight loss, no fever  Cardiac:  no chest pain with exertion, no chest pain at rest, +SOB with minimal exertion, + intermittent resting SOB, + PND, + orthopnea, + palpitations, no arrhythmia, no atrial fibrillation, no LE edema, + dizzy spells, no syncope  Respiratory:  + shortness of breath, no home oxygen, no productive cough, no dry cough, no bronchitis, no wheezing, no hemoptysis, no asthma, no pain with inspiration or cough, + sleep apnea, + CPAP at night  GI:   + occasional difficulty swallowing, no reflux, no frequent heartburn, no hiatal hernia, no abdominal pain, no constipation, no diarrhea, no hematochezia, no hematemesis, no melena  GU:   no dysuria,  + frequency, no urinary tract infection, no hematuria, + enlarged prostate, no kidney stones, + kidney disease  Vascular:  no pain suggestive of claudication, no pain in feet, no leg cramps, no varicose veins, no DVT, no non-healing foot ulcer  Neuro:   + stroke, + TIA's, no seizures, no headaches, no temporary blindness one eye,  no slurred speech, no peripheral neuropathy, no chronic pain, no instability of gait, no memory/cognitive dysfunction  Musculoskeletal: + arthritis particularly in hips, no joint swelling, no myalgias,  no difficulty walking, normal mobility   Skin:   no rash, no itching, no skin infections, no pressure sores or ulcerations  Psych:   no anxiety, no depression, no nervousness, no unusual recent stress  Eyes:   no blurry vision, no floaters, no recent vision changes,  wears glasses for reading  ENT:   no hearing loss, poor dentition but no loose or painful teeth, no dentures, last saw dentist more than 5 years ago  Hematologic:  + easy bruising, no abnormal bleeding, no clotting disorder, no frequent epistaxis  Endocrine:  + diabetes, does not check CBG's at home           Physical Exam:   BP 137/62 mmHg  Pulse 68  Resp 20  Ht 5\' 10"  (1.778 m)  Wt 230 lb (104.327 kg)  BMI 33.00 kg/m2  SpO2 94%  General:  Chronically ill-appearing  HEENT:  Unremarkable   Neck:   no JVD, no bruits, no adenopathy   Chest:   clear to auscultation, symmetrical breath sounds, no wheezes, no rhonchi   CV:   RRR, grade IV/VI crescendo/decrescendo murmur heard best at RUSB,  no diastolic murmur  Abdomen:  soft, non-tender, no masses   Extremities:  warm, well-perfused, pulses not palpable, no LE edema  Rectal/GU  Deferred  Neuro:   Grossly non-focal and symmetrical throughout  Skin:   Clean and dry, no rashes, no breakdown   Diagnostic Tests:  Transthoracic Echocardiography  Patient:  Randal, Goens MR #:    [id number] Study Date: 01/09/2015 Gender:   M Age:    46 Height:   175.3 cm  Weight:   114.3 kg BSA:    2.41 m^2 Pt. Status: Room:    3E15C  ATTENDING  Marisa Severin [id number] ADMITTING  Victorio Palm REFERRING  Hillary Bow SONOGRAPHER Arvil Chaco PERFORMING  [facility], Inpatient  cc:  ------------------------------------------------------------------- LV EF: 55% -  60%  ------------------------------------------------------------------- Indications:   Murmur  785.2.  ------------------------------------------------------------------- Study Conclusions  - Left ventricle: The cavity size was normal. There was mild concentric hypertrophy. Systolic function was normal. The estimated ejection fraction was in the range of 55% to 60%. Although no diagnostic regional wall motion abnormality was identified, this possibility cannot be completely excluded on the basis of this study. Features are consistent with a pseudonormal left ventricular filling pattern, with concomitant abnormal relaxation and increased filling pressure (grade 2 diastolic dysfunction). - Aortic valve: Cusp separation was reduced. There was moderate stenosis. Peak velocity (S): 386 cm/s. Mean gradient (S): 32 mm Hg. - Mitral valve: Moderately calcified annulus. - Left atrium: The atrium was mildly to moderately dilated.  Impressions:  - When compared to prior echocardiogram, aortic stenosis has advanced.  Transthoracic echocardiography. M-mode, complete 2D, spectral Doppler, and color Doppler. Birthdate: Patient birthdate: [birth year]. Age: Patient is 81 yr old. Sex: Gender: male. BMI: 37.2 kg/m^2. Blood pressure:   121/48 Patient status: Inpatient. Study date: Study date: 01/09/2015. Study time: 01:50 PM. Location: Bedside.  -------------------------------------------------------------------  ------------------------------------------------------------------- Left ventricle: The cavity size was normal. There was mild concentric hypertrophy. Systolic function was normal. The estimated ejection fraction was in the range of 55% to 60%. Although no diagnostic regional wall motion abnormality was identified, this possibility cannot be completely excluded on the basis of this study. Features are consistent with a pseudonormal left ventricular filling pattern, with concomitant abnormal relaxation and increased filling pressure (grade 2  diastolic dysfunction).  ------------------------------------------------------------------- Aortic valve:  Moderately thickened, moderately calcified leaflets. Cusp separation was reduced. Doppler:  There was moderate stenosis.  There was no regurgitation.  VTI ratio of LVOT to aortic valve: 0.32. Valve area (VTI): 0.64 cm^2. Indexed valve area (VTI): 0.27 cm^2/m^2. Peak velocity ratio of LVOT to aortic valve: 0.34. Valve area (Vmax): 0.69 cm^2. Indexed valve area (Vmax): 0.29 cm^2/m^2. Mean velocity ratio of LVOT to aortic valve: 0.31. Valve area (Vmean): 0.63 cm^2. Indexed valve area (Vmean): 0.26 cm^2/m^2.  Mean gradient (S): 32 mm Hg. Peak gradient (S): 60 mm Hg.  ------------------------------------------------------------------- Aorta: The aorta was normal, not dilated, and non-diseased.  ------------------------------------------------------------------- Mitral valve:  Moderately calcified annulus. Leaflet separation was normal. Doppler: Transvalvular velocity was within the normal range. There was no evidence for stenosis. There was no regurgitation.  Peak gradient (D): 10 mm Hg.  ------------------------------------------------------------------- Left atrium: The atrium was mildly to moderately dilated.  ------------------------------------------------------------------- Right ventricle: The cavity size was normal. Wall thickness was normal. Systolic function was normal.  ------------------------------------------------------------------- Pulmonic valve:  Poorly visualized. Structurally normal valve. Cusp separation was normal. Doppler: Transvalvular velocity was within the normal range. There was trivial regurgitation.  ------------------------------------------------------------------- Tricuspid valve:  Structurally normal valve.  Leaflet separation was normal. Doppler: Transvalvular velocity was within the normal range. There was trivial  regurgitation.  ------------------------------------------------------------------- Pulmonary artery:  The main pulmonary artery was normal-sized.  ------------------------------------------------------------------- Right atrium: The atrium was normal in size.  ------------------------------------------------------------------- Pericardium: The pericardium was normal in appearance. There was no pericardial effusion.  ------------------------------------------------------------------- Systemic veins: Inferior vena cava: The vessel was normal in size. The respirophasic diameter changes were in the normal range (>= 50%),  consistent with normal central venous pressure.  ------------------------------------------------------------------- Post procedure conclusions Ascending Aorta:  - The aorta was normal, not dilated, and non-diseased.  ------------------------------------------------------------------- Measurements  Left ventricle              Value     Reference LV ID, ED, PLAX chordal      (L)   36  mm    43 - 52 LV ID, ES, PLAX chordal          23.8 mm    23 - 38 LV fx shortening, PLAX chordal      34  %    >=29 LV PW thickness, ED            12.7 mm    --------- IVS/LV PW ratio, ED            0.96      <=1.3 Stroke volume, 2D             51  ml    --------- Stroke volume/bsa, 2D           21  ml/m^2  --------- LV e&', lateral              6.45 cm/s   --------- LV E/e&', lateral             24.19     --------- LV e&', medial               6.83 cm/s   --------- LV E/e&', medial              22.84     --------- LV e&', average              6.64 cm/s   --------- LV E/e&', average             23.49     ---------  Ventricular septum             Value     Reference IVS thickness, ED             12.2 mm    ---------  LVOT                   Value     Reference LVOT ID, S                16  mm    --------- LVOT area                 2.01 cm^2   --------- LVOT peak velocity, S           133  cm/s   --------- LVOT mean velocity, S           82.4 cm/s   --------- LVOT VTI, S                25.5 cm    --------- LVOT peak gradient, S           7   mm Hg  ---------  Aortic valve               Value     Reference Aortic valve peak velocity, S       386  cm/s   --------- Aortic valve mean velocity, S       264  cm/s   --------- Aortic valve VTI, S            80.6 cm    --------- Aortic mean  gradient, S          32  mm Hg  --------- Aortic peak gradient, S          60  mm Hg  --------- VTI ratio, LVOT/AV            0.32      --------- Aortic valve area, VTI          0.64 cm^2   --------- Aortic valve area/bsa, VTI        0.27 cm^2/m^2 --------- Velocity ratio, peak, LVOT/AV       0.34      --------- Aortic valve area, peak velocity     0.69 cm^2   --------- Aortic valve area/bsa, peak        0.29 cm^2/m^2 --------- velocity Velocity ratio, mean, LVOT/AV       0.31      --------- Aortic valve area, mean velocity     0.63 cm^2   --------- Aortic valve area/bsa, mean        0.26 cm^2/m^2 --------- velocity  Aorta                   Value     Reference Aortic root ID, ED            30  mm    ---------  Left atrium                Value     Reference LA ID, A-P, ES              51  mm    --------- LA ID/bsa, A-P               2.12 cm/m^2  <=2.2 LA volume, ES, 1-p A4C          62.7 ml    --------- LA volume/bsa, ES, 1-p A4C        26.1 ml/m^2  --------- LA volume, ES, 1-p A2C          106  ml    --------- LA volume/bsa, ES, 1-p A2C        44.1 ml/m^2  ---------  Mitral valve               Value     Reference Mitral E-wave peak velocity        156  cm/s   --------- Mitral A-wave peak velocity        112  cm/s   --------- Mitral deceleration time     (H)   278  ms    150 - 230 Mitral peak gradient, D          10  mm Hg  --------- Mitral E/A ratio, peak          1.4      ---------  Right ventricle              Value     Reference TAPSE                   19.8 mm    --------- RV s&', lateral, S             10.1 cm/s   ---------  Legend: (L) and (H) mark values outside specified reference range.  ------------------------------------------------------------------- Prepared and Electronically Authenticated by  Donato Schultz, M.D. 2016-06-18T15:37:55     CARDIAC CATHETERIZATION  Procedures    Right/Left Heart Cath and Coronary Angiography    PACS Images  Show images for Cardiac catheterization     Link to Procedure Log    Procedure Log      Indications    Aortic stenosis [I35.0 (ICD-10-CM)]    Technique and Indications    INDICATION: Severe symptomatic aortic stenosis  PROCEDURAL DETAILS: There was an indwelling IV in a right antecubital vein. Using normal sterile technique, the IV was changed out for a 5 Fr brachial sheath over a 0.018 inch wire. The right wrist was then prepped, draped, and anesthetized with 1% lidocaine. Using the modified Seldinger technique a 5/6 French Slender sheath was placed in the right radial artery. Intra-arterial verapamil was  administered through the radial artery sheath. IV heparin was administered after a JR4 catheter was advanced into the central aorta. A Swan-Ganz catheter was used for the right heart catheterization. Standard protocol was followed for recording of right heart pressures and sampling of oxygen saturations. Fick cardiac output was calculated. Standard Judkins catheters were used for selective coronary angiography. An AL-2 catheter was used to direct a straight tip wire across the aortic valve. There were no immediate procedural complications. The patient was transferred to the post catheterization recovery area for further monitoring.  ESTIMATED BLOOD LOSS: Minimal   There were no immediate complications during the procedure.    Conclusion     Mid RCA lesion, 60% stenosed.  Ost RCA lesion, 40% stenosed.  Ost LM to LM lesion, 30% stenosed.  Ost Cx lesion, 50% stenosed.  Prox LAD to Mid LAD lesion, 40% stenosed.  Ost 1st Mrg to 1st Mrg lesion, 20% stenosed. The lesion was previously treated with a stent (unknown type) greater than two years ago.  1. Moderate stenosis of the right coronary artery 2. Heavy calcification with mild nonobstructive stenosis of the LAD 3. Mild to moderate ostial left circumflex stenosis with continued patency of the stented segment in the first OM 4. Calcified aortic valve with hemodynamic findings consistent with severe aortic stenosis  Recommend cardiac surgical evaluation for multidisciplinary approach to this patient with multiple comorbid medical conditions and severe symptomatic aortic stenosis.     Coronary Findings    Dominance: Right   Left Main   . Ost LM to LM lesion, 30% stenosed. calcified .     Left Anterior Descending   . Prox LAD to Mid LAD lesion, 40% stenosed. calcified diffuse . The proximal LAD is heavily calcified. There is diffuse irregularity through the proximal and mid LAD.     Left Circumflex   . Ost Cx lesion, 50%  stenosed. calcified .   Marland Kitchen First Obtuse Marginal Branch   . Ost 1st Mrg to 1st Mrg lesion, 20% stenosed. The lesion was previously treated with a stent (unknown type) greater than two years ago.     Right Coronary Artery   . Ost RCA lesion, 40% stenosed. 40% ostial RCA lesion noted   . Mid RCA lesion, 60% stenosed. diffuse . There is diffuse 60-70% stenosis through the mid RCA with associated calcification       Right Heart Pressures Hemodynamic findings consistent with aortic stenosis. Elevated LV EDP consistent with volume overload.    Left Heart    Aortic Valve There is severe aortic valve stenosis. The aortic valve is calcified. There is restricted aortic valve motion. The mean transaortic valve gradient is 38 mmHg, calculated aortic valve area 0.78 cm, aortic valve area index 0.35    Coronary Diagrams    Diagnostic Diagram  Implants    Name ID Temporary Type Supply   No information to display    Hemo Data       Most Recent Value   Fick Cardiac Output  4.7 L/min   Fick Cardiac Output Index  2.12 (L/min)/BSA   Aortic Mean Gradient  37.9 mmHg   Aortic Peak Gradient  36 mmHg   Aortic Valve Area  0.83   Aortic Value Area Index  0.37 cm2/BSA   RA A Wave  16 mmHg   RA V Wave  12 mmHg   RA Mean  10 mmHg   RV Systolic Pressure  54 mmHg   RV Diastolic Pressure  3 mmHg   RV EDP  12 mmHg   PA Systolic Pressure  51 mmHg   PA Diastolic Pressure  17 mmHg   PA Mean  31 mmHg   PW A Wave  22 mmHg   PW V Wave  19 mmHg   PW Mean  15 mmHg   AO Systolic Pressure  123 mmHg   AO Diastolic Pressure  50 mmHg   AO Mean  78 mmHg   LV Systolic Pressure  178 mmHg   LV Diastolic Pressure  5 mmHg   LV EDP  22 mmHg   Arterial Occlusion Pressure Extended Systolic Pressure  144 mmHg   Arterial Occlusion Pressure Extended Diastolic Pressure  50 mmHg   Arterial Occlusion Pressure Extended Mean Pressure  85 mmHg   Left Ventricular Apex  Extended Systolic Pressure  180 mmHg   Left Ventricular Apex Extended Diastolic Pressure  11 mmHg   Left Ventricular Apex Extended EDP Pressure  27 mmHg   QP/QS  0.94   TPVR Index  15.54 HRUI   TSVR Index  36.85 HRUI   PVR SVR Ratio  0.25   TPVR/TSVR Ratio  0.42    Cardiac TAVR CT  TECHNIQUE: The patient was scanned on a Philips 256 scanner. A 120 kV retrospective scan was triggered in the descending thoracic aorta at 111 HU's. Gantry rotation speed was 270 msecs and collimation was .9 mm. No beta blockade or nitro were given. The 3D data set was reconstructed in 5% intervals of the R-R cycle. Systolic and diastolic phases were analyzed on a dedicated work station using MPR, MIP and VRT modes. The patient received 80 cc of contrast.  FINDINGS: Aortic Valve: Trileaflet and heavily calcified  Aorta: Normal arch vessel take off no coarctation No contraindications to stented valve delivery  Sinotubular Junction: 27 mm  Ascending Thoracic Aorta: 32 mm  Sinus of Valsalva Measurements:  Non-coronary: 29.2  Right coronary: 28  Left coronary: 29.5  Coronary Artery Height above Annulus:  Left Main: 13 mm  Right Coronary: 13.5 mm  Virtual Basal Annulus Measurements:  Maximum/Minimum Diameter: 25.8 x 20.5  Area: 416 mm2  Coronary Arteries: Adequate height above annulus for deployment  Optimum Fluoroscopic Angle for Delivery: LAO 10 degrees Cranial 1 degree  IMPRESSION: 1) Calcified trileaflet aortic valve suitable for under inflated 26 mm Sapien 3 valve  2) Suitable coronary artery height for deployment  3) Nodular annular calcification at base of non coronary cusp and dense subannular calcification along the anterior mitral leaflet may increase risk of para valvular regurgitation  4) Optimum angiographic angle for deployment LAO 10 degrees Cranial 1 degree  Charlton Haws   Electronically Signed  By: Charlton Haws  M.D.  On: 03/24/2015 12:18    STS Risk Calculator  Procedure    AVR + CABG  Risk of Mortality   8.7% Morbidity or Mortality  45.1% Prolonged LOS   26.0% Short LOS    10.4% Permanent Stroke   6.0% Prolonged Vent Support  31.1% DSW Infection    1.2% Renal Failure    18.6% Reoperation    15.1%    Impression:  Patient has stage D severe symptomatic aortic stenosis. He presents with worsening symptoms of shortness of breath and fatigue consistent with chronic diastolic congestive heart failure, currently New York Heart Association functional class IIIB-IV. He has been hospitalized at total of 3 times with acute exacerbations of congestive heart failure since last November, most recently last week. I have personally reviewed the patient's most recent transthoracic echocardiogram and diagnostic cardiac catheterization. The patient has severe thickening, calcification, and restricted leaflet mobility involving all 3 leaflets of the aortic valve. Peak velocity across the aortic valve approaches 4 m/s and mean transvalvular gradient was measured 38 mmHg by catheterization, corresponding to the aortic valve area calculated 0.78 cm. Left ventricular systolic function remains reasonably well-preserved. Diagnostic cardiac catheterization demonstrates the presence of moderate coronary artery disease, most notably with long segment 60-70% stenosis of the mid right coronary artery and hazy ostial stenosis of the left circumflex coronary artery that is at least 50%. Risks associated with conventional surgical aortic valve replacement with or without coronary artery bypass grafting would be relatively high because of the patient's age and numerous comorbid medical problems.  Cardiac gated CT angiogram of the heart confirms the presence of severe aortic stenosis and reveals anatomical characteristics suitable for transcatheter aortic valve replacement without any significant complicating features other than a  single area of calcification in the aortic annulus.  As a result, I feel that TAVR might be a much less invasive and potentially less risky treatment alternative to conventional surgery.  Although his coronary artery disease is significant, the patient is not having symptoms suggestive of angina pectoris and I think it could be treated medically for now and percutaneously in the future should symptoms develop.  With respect to his chronic diastolic congestive heart failure and chronic respiratory insufficiency, the patient has not been doing well recently on medical therapy and may need something done sooner rather than later.  Unfortunately, he does not have good dentition and will need consultation in the Dental Clinic for possible extraction of his remaining teeth.  However, in the absence of ongoing problems concerning for risk of infection, it might be best to defer dental extraction until after his aortic stenosis has been definitively treated.   Plan:  The patient was counseled at length regarding treatment alternatives for management of severe symptomatic aortic stenosis. Alternative approaches such as conventional aortic valve replacement, transcatheter aortic valve replacement, and palliative medical therapy were compared and contrasted at length.  The risks associated with conventional surgical aortic valve replacement were been discussed in detail, as were expectations for post-operative convalescence. Long-term prognosis with medical therapy was discussed. This discussion was placed in the context of the patient's own specific clinical presentation and past medical history, including the presence of coronary artery disease as noted on recent catheterization.  All of his questions have been addressed.  He hopes to proceed with TAVR in the near future as an alternative to high risk surgery.  The patient will undergo CT angiography to evaluate whether or not he has adequate pelvic vascular access to  facilitate a transfemoral approach for TAVR.  He is scheduled for pulmonary function tests and a PT evaluation, and he  will be referred for dental consultation.  He will return to our office once this has been completed to make final plans.  He has been instructed to stop taking Plavix in anticipation of possible surgery next week.   I spent in excess of 90 minutes during the conduct of this office consultation and >50% of this time involved direct face-to-face encounter with the patient for counseling and/or coordination of their care.    Salvatore Decent. Cornelius Moras, MD 03/30/2015 4:01PM

## 2015-03-30 NOTE — Patient Instructions (Signed)
Patient has been instructed to stop taking Plavix  Patient should continue taking all other medications without change through the day before surgery.  Patient should have nothing to eat or drink after midnight the night before surgery.  On the morning of surgery patient should take only Toprol-XL and Protonix with a sip of water.

## 2015-03-31 ENCOUNTER — Ambulatory Visit (HOSPITAL_COMMUNITY): Payer: Medicare Other

## 2015-03-31 ENCOUNTER — Telehealth: Payer: Self-pay | Admitting: Internal Medicine

## 2015-03-31 ENCOUNTER — Other Ambulatory Visit: Payer: Self-pay | Admitting: *Deleted

## 2015-03-31 ENCOUNTER — Encounter: Payer: Self-pay | Admitting: Physical Therapy

## 2015-03-31 ENCOUNTER — Encounter (HOSPITAL_COMMUNITY): Payer: Self-pay

## 2015-03-31 ENCOUNTER — Ambulatory Visit (HOSPITAL_COMMUNITY)
Admission: RE | Admit: 2015-03-31 | Discharge: 2015-03-31 | Disposition: A | Discharge status: Home / Self Care | Payer: Medicare Other | Source: Ambulatory Visit | Attending: Cardiovascular Disease | Admitting: Cardiovascular Disease

## 2015-03-31 ENCOUNTER — Ambulatory Visit: Payer: Medicare Other | Attending: Surgery | Admitting: Physical Therapy

## 2015-03-31 DIAGNOSIS — I350 Nonrheumatic aortic (valve) stenosis: Secondary | ICD-10-CM | POA: Insufficient documentation

## 2015-03-31 DIAGNOSIS — R262 Difficulty in walking, not elsewhere classified: Secondary | ICD-10-CM | POA: Insufficient documentation

## 2015-03-31 DIAGNOSIS — J8410 Pulmonary fibrosis, unspecified: Secondary | ICD-10-CM | POA: Insufficient documentation

## 2015-03-31 DIAGNOSIS — N289 Disorder of kidney and ureter, unspecified: Secondary | ICD-10-CM | POA: Diagnosis not present

## 2015-03-31 DIAGNOSIS — I700 Atherosclerosis of aorta: Secondary | ICD-10-CM | POA: Insufficient documentation

## 2015-03-31 DIAGNOSIS — M6281 Muscle weakness (generalized): Secondary | ICD-10-CM | POA: Diagnosis present

## 2015-03-31 DIAGNOSIS — R293 Abnormal posture: Secondary | ICD-10-CM | POA: Diagnosis present

## 2015-03-31 DIAGNOSIS — I517 Cardiomegaly: Secondary | ICD-10-CM | POA: Insufficient documentation

## 2015-03-31 DIAGNOSIS — K4090 Unilateral inguinal hernia, without obstruction or gangrene, not specified as recurrent: Secondary | ICD-10-CM | POA: Insufficient documentation

## 2015-03-31 DIAGNOSIS — R591 Generalized enlarged lymph nodes: Secondary | ICD-10-CM | POA: Diagnosis not present

## 2015-03-31 DIAGNOSIS — I2510 Atherosclerotic heart disease of native coronary artery without angina pectoris: Secondary | ICD-10-CM | POA: Insufficient documentation

## 2015-03-31 DIAGNOSIS — Z01818 Encounter for other preprocedural examination: Secondary | ICD-10-CM | POA: Insufficient documentation

## 2015-03-31 LAB — COMPLETE METABOLIC PANEL WITH GFR
ALBUMIN: 4.2 g/dL (ref 3.6–5.1)
ALK PHOS: 90 U/L (ref 40–115)
ALT: 17 U/L (ref 9–46)
AST: 20 U/L (ref 10–35)
BUN: 24 mg/dL (ref 7–25)
CO2: 31 mmol/L (ref 20–31)
Calcium: 9.2 mg/dL (ref 8.6–10.3)
Chloride: 103 mmol/L (ref 98–110)
Creat: 1.52 mg/dL — ABNORMAL HIGH (ref 0.70–1.11)
GFR, EST NON AFRICAN AMERICAN: 43 mL/min — AB (ref >=60)
GFR, Est African American: 49 mL/min — ABNORMAL LOW (ref >=60)
GLUCOSE: 119 mg/dL — AB (ref 65–99)
POTASSIUM: 4.7 mmol/L (ref 3.5–5.3)
SODIUM: 144 mmol/L (ref 135–146)
TOTAL PROTEIN: 7.5 g/dL (ref 6.1–8.1)
Total Bilirubin: 0.4 mg/dL (ref 0.2–1.2)

## 2015-03-31 LAB — PULMONARY FUNCTION TEST
DL/VA % PRED: 99 %
DL/VA: 4.36 ml/min/mmHg/L
DLCO UNC: 18.96 ml/min/mmHg
DLCO unc % pred: 67 %
FEF 25-75 POST: 2.34 L/s
FEF 25-75 Pre: 1.39 L/sec
FEF2575-%Change-Post: 68 %
FEF2575-%PRED-POST: 138 %
FEF2575-%PRED-PRE: 82 %
FEV1-%CHANGE-POST: 10 %
FEV1-%Pred-Post: 69 %
FEV1-%Pred-Pre: 62 %
FEV1-Post: 1.72 L
FEV1-Pre: 1.56 L
FEV1FVC-%Change-Post: 4 %
FEV1FVC-%PRED-PRE: 108 %
FEV6-%Change-Post: 5 %
FEV6-%Pred-Post: 64 %
FEV6-%Pred-Pre: 60 %
FEV6-POST: 2.1 L
FEV6-Pre: 1.98 L
FEV6FVC-%CHANGE-POST: 0 %
FEV6FVC-%PRED-POST: 106 %
FEV6FVC-%Pred-Pre: 106 %
FVC-%Change-Post: 6 %
FVC-%PRED-POST: 59 %
FVC-%PRED-PRE: 56 %
FVC-POST: 2.11 L
FVC-PRE: 1.99 L
POST FEV1/FVC RATIO: 82 %
PRE FEV1/FVC RATIO: 78 %
PRE FEV6/FVC RATIO: 100 %
Post FEV6/FVC ratio: 99 %
RV % pred: 106 %
RV: 2.67 L
TLC % PRED: 87 %
TLC: 5.62 L

## 2015-03-31 MED ORDER — IOHEXOL 350 MG/ML SOLN
100.0000 mL | Freq: Once | INTRAVENOUS | Status: AC | PRN
  Administered 2015-03-31: 80 mL via INTRAVENOUS

## 2015-03-31 MED ORDER — ALBUTEROL SULFATE (2.5 MG/3ML) 0.083% IN NEBU
2.5000 mg | INHALATION_SOLUTION | Freq: Once | RESPIRATORY_TRACT | Status: AC
  Administered 2015-03-31: 2.5 mg via RESPIRATORY_TRACT

## 2015-03-31 NOTE — Telephone Encounter (Signed)
Pt called and he has a hospital f/u tomorrow at 2. He has a Education officer, community appt across the street at [facility] at 1. I was unable to fit him in with you this week for this appt.  He is having surgery next week. I went ahead and kept this appt for him with the hopes he will be here on time. I asked him to have the dentist call if he will be late. Hope this is ok?

## 2015-03-31 NOTE — Therapy (Signed)
[facility] [address] [place], [place], [address] Phone: [phone]   Fax:  [phone]  Physical Therapy Evaluation  Patient Details  Name: [name] MRN: [id number] Date of Birth: [birth year] Referring Provider:  Alleen Borne, MD  Encounter Date: 03/31/2015      PT End of Session - 03/31/15 1333    Visit Number 1   PT Start Time 1333   PT Stop Time 1421   PT Time Calculation (min) 48 min      Past Medical History  Diagnosis Date  . Gout, unspecified     severe dz, "treatment done that last 15 years"  . Obesity (BMI 30-39.9)   . Restless leg syndrome   . History of retinal detachment 1994  . Hypertensive heart disease   . CAD (coronary artery disease), native coronary artery   . History of pericarditis 2007    MSSA, s/p pericardial window  . GERD   . Osteoarthritis   . Sleep apnea in adult     CPAP qhs  . TIA (transient ischemic attack) 2005  . BPH (benign prostatic hypertrophy)     "minor"  . Lumbar spinal stenosis   . Carotid artery occlusion   . Unspecified venous (peripheral) insufficiency   . Stroke May 2003  . Anxiety   . Depression   . Aortic stenosis     ECHO 05/29/14 moderate AS Mean gradient 21 mm EF 55% ECHO 07/09/13  Mean gradient 12 mm Mild to moderate AS EF 50%    . CAD (coronary artery disease)     02/11/09  Xience stent to circumflex 2.5 x 18 mm postdilated to 2.75 mm  Cath sept 2011  normal Left main, 20 % stenosis proximal LAD, 40% stenosis mid LAD, 70% stenosis proximal Diag 1, 90% stenosis prox CFX, 90% first OM, small and nondominant RCA;   . Carotid artery disease     Prior TIA 2006 with left CEA by Dr. Arbie Cookey Recurrent TIA in April 2011   . Lumbar disc disease   . CKD (chronic kidney disease), stage III 02/08/2014  . CHF (congestive heart failure)   . Renal insufficiency   . Hypertension   . Type 2 diabetes mellitus with vascular disease     Takes no meds    Past Surgical  History  Procedure Laterality Date  . Pericardial window  2007  . Left arm  2008    shoulder  . Right arm  1970's    shoulder  . Cataract extraction      Left side x's 2 and right  . Retinal detachment surgery      left side  . Carotid endarterectomy Left 12-06-05    cea  . Coronary angioplasty  01/2009    x 1 stent  . Colonoscopy    . Carpal tunnel release Bilateral   . Back surgery  2013    removed bone spurs  . Inguinal hernia repair Left 10/01/2013    Procedure: HERNIA REPAIR INGUINAL ADULT;  Surgeon: Axel Filler, MD;  Location: [facility]S;  Service: General;  Laterality: Left;  . Insertion of mesh Left 10/01/2013    Procedure: INSERTION OF MESH;  Surgeon: Axel Filler, MD;  Location: [facility];  Service: General;  Laterality: Left;  . Finger amputation  May 2015    JUST TO FIRST JOINT RIGHT HAND  LAST FINGER ( Right 5th finger)  . Peripheral vascular catheterization N/A 01/08/2015    Procedure: Abdominal Aortogram;  Surgeon:  Chuck Hint, MD;  Location: [facility] INVASIVE CV LAB;  Service: Cardiovascular;  Laterality: N/A;  . Cardiac catheterization N/A 03/15/2015    Procedure: Right/Left Heart Cath and Coronary Angiography;  Surgeon: Tonny Bollman, MD;  Location: [facility] INVASIVE CV LAB;  Service: Cardiovascular;  Laterality: N/A;    There were no vitals filed for this visit.  Visit Diagnosis:  Difficulty walking - Plan: PT PLAN OF CARE CERT/RE-CERT  Generalized muscle weakness - Plan: PT PLAN OF CARE CERT/RE-CERT  Abnormal posture - Plan: PT PLAN OF CARE CERT/RE-CERT  Severe aortic stenosis - Plan: PT PLAN OF CARE CERT/RE-CERT      Subjective Assessment - 03/31/15 1340    Subjective noticed worsening SOB over past months which severely limits his activity level, unable to do woodworking, kids assist with housework   Patient Stated Goals to return woodworking and other hobbies   Currently in Pain? No/denies            [facility] PT Assessment - 03/31/15 0001     Assessment   Medical Diagnosis severe aortic stenosis   Onset Date/Surgical Date 06/29/14  approximate   Hand Dominance Right   Precautions   Precautions None   Restrictions   Weight Bearing Restrictions No   Balance Screen   Has the patient fallen in the past 6 months No   Has the patient had a decrease in activity level because of a fear of falling?  No   Is the patient reluctant to leave their home because of a fear of falling?  No   Home Environment   Living Environment Private residence   Living Arrangements Alone   Home Access Stairs to enter   Entrance Stairs-Number of Steps 3-4   Entrance Stairs-Rails Right   Home Layout One level   Prior Function   Level of Independence Independent with basic ADLs;Independent with household mobility without device  Woodworking, mechanical work   Posture/Postural Control   Posture/Postural Control Postural limitations   Postural Limitations Rounded Shoulders;Forward head   Posture Comments bil. claw hands at rest, pt unable to extend at PIP and MP joints   ROM / Strength   AROM / PROM / Strength AROM;Strength   AROM   Overall AROM Comments Limited with Shoulder Flexion and IR by about 25% due to arthritis, otherwise WNL.   Strength   Overall Strength Comments Shoulders 4-/5, eblbow 4+/5, lower extremities grossly 4/5 with LLE unable to hold contraction as long as RLE in all movements   Strength Assessment Site Hand   Right/Left hand Right;Left   Right Hand Grip (lbs) 44   Left Hand Grip (lbs) 46  requires assist to position dynaomometer   Ambulation/Gait   Gait Comments Pt ambulates with wide BOS and decreased step length bil.           [facility] Pre-Surgical Assessment - 03/31/15 0001    5 Meter Walk Test- trial 1 5 sec   5 Meter Walk Test- trial 2 5 sec.    5 Meter Walk Test- trial 3 6 sec.  </= 6 sec WNL   5 meter walk test average 5.33 sec   Timed Up & Go Test trial  13 sec.   Comments > 12 sec indicates increased fall risk    4 Stage Balance Test tolerated for:  2 sec.   4 Stage Balance Test Position 3   comment inability to hold position 3 x 10 seconds indicative of increased fall risk   Sit To Stand  Test- trial 1 11 sec.   Comment </= 14.8 WNL for age/gender   ADL/IADL Independent with: Bathing;Dressing;Finances   ADL/IADL Needs Assistance with: Meal prep;Yard work   ADL/IADL Freight forwarder Index Vulnerable   6 Minute Walk- Baseline yes   BP (mmHg) 152/48 mmHg   HR (bpm) 66   02 Sat (%RA) 95 %   Modified Borg Scale for Dyspnea 0- Nothing at all   Perceived Rate of Exertion (Borg) 6-   6 Minute Walk Post Test yes   BP (mmHg) 158/62 mmHg   HR (bpm) 78   02 Sat (%RA) 89 %   Modified Borg Scale for Dyspnea 7- Severe shortness of breath or very hard breathing   Perceived Rate of Exertion (Borg) 15- Hard   Aerobic Endurance Distance Walked 463   Endurance additional comments Pt required seated rest break at 2:15 due to severe SOB. Rested 3 minutes before able to continue. Vitals at rest 92% and 98 bpm.                                     Plan - 04-12-15 1434    Clinical Impression Statement Pt is an 81 yo male presenting to OP PT for evaluation prior to TAVR surgery due to severe aortic stenosis. Pt reports a gradual decline over the last year and has had a couple of hospitalizations due to heart failure. Pt's primary symptom is SOB and he has had to give up woodworking due to symptoms. Pt presents with good ROM/strength overall, fair to good balance (scored at slight incr. fall risk per Timed up and go and 4 stage balance test), and poor aerobic endurance with 6 minute walk test. Pt demonstrated and reported severe SOB with walking 2 minutes and required 3 minutes rest before resuming. Oxygen down to 89% at completion of test.    PT Frequency One time visit   Consulted and Agree with Plan of Care Patient          G-Codes - 04-12-2015 1450    Functional Assessment Tool Used 6 minute  walk 463'   Functional Limitation Mobility: Walking and moving around   Mobility: Walking and Moving Around Current Status [phone]) At least 60 percent but less than 80 percent impaired, limited or restricted   Mobility: Walking and Moving Around Goal Status [phone]) At least 60 percent but less than 80 percent impaired, limited or restricted   Mobility: Walking and Moving Around Discharge Status [phone]) At least 60 percent but less than 80 percent impaired, limited or restricted       Problem List Patient Active Problem List   Diagnosis Date Noted  . Aortic stenosis, severe 03/30/2015  . Aortic stenosis 03/15/2015  . Severe aortic stenosis   . Allergic drug rash 03/04/2015  . Staphylococcus epidermidis bacteremia   . Pressure ulcer 01/20/2015  . Endotracheally intubated   . Encephalopathy   . Acute respiratory failure   . Elevated troponin   . Altered mental status   . Abnormal involuntary movements   . Acute respiratory failure with hypoxia 01/09/2015  . Chronic diastolic heart failure 07/28/2014  . Obesity hypoventilation syndrome   . CKD (chronic kidney disease), stage III 02/08/2014  . Venous stasis ulcer 07/08/2013  . Chronic venous insufficiency 06/26/2013  . CAD (coronary artery disease)   . Lumbar disc disease   . Aortic stenosis   . BPH (benign prostatic hypertrophy)   .  Obstructive sleep apnea, adult 08/23/2010  . Chronic depression   . Anxiety state 06/27/2010  . Type 2 diabetes mellitus with vascular disease   . Personal history of TIA  without residual deficit   . GERD   . Tophacious gout   . Obesity (BMI 30-39.9)   . Dyslipidemia   . Restless leg syndrome   . Hypertensive heart disease   . Degenerative joint disease   . Carotid artery disease   . History of pericardial window for staph pericarditis 05/29/2006    NICOLETTA,DANA, PT 03/31/2015, 2:52 PM  [facility] [address] [place], [place],  [address] Phone: [phone]   Fax:  [phone]

## 2015-04-01 ENCOUNTER — Other Ambulatory Visit: Payer: Self-pay | Admitting: *Deleted

## 2015-04-01 ENCOUNTER — Inpatient Hospital Stay: Payer: Medicare Other | Admitting: Internal Medicine

## 2015-04-01 ENCOUNTER — Ambulatory Visit (HOSPITAL_COMMUNITY): Payer: Self-pay | Admitting: Dentistry

## 2015-04-01 ENCOUNTER — Encounter (HOSPITAL_COMMUNITY): Payer: Self-pay | Admitting: Dentistry

## 2015-04-01 VITALS — BP 152/57 | HR 64 | Temp 97.5°F

## 2015-04-01 DIAGNOSIS — IMO0002 Reserved for concepts with insufficient information to code with codable children: Secondary | ICD-10-CM

## 2015-04-01 DIAGNOSIS — I350 Nonrheumatic aortic (valve) stenosis: Secondary | ICD-10-CM | POA: Diagnosis present

## 2015-04-01 DIAGNOSIS — Z01818 Encounter for other preprocedural examination: Secondary | ICD-10-CM | POA: Diagnosis not present

## 2015-04-01 DIAGNOSIS — Z9189 Other specified personal risk factors, not elsewhere classified: Secondary | ICD-10-CM

## 2015-04-01 DIAGNOSIS — K0530 Chronic periodontitis, unspecified: Secondary | ICD-10-CM

## 2015-04-01 DIAGNOSIS — M264 Malocclusion, unspecified: Secondary | ICD-10-CM

## 2015-04-01 DIAGNOSIS — K08409 Partial loss of teeth, unspecified cause, unspecified class: Secondary | ICD-10-CM

## 2015-04-01 DIAGNOSIS — K083 Retained dental root: Secondary | ICD-10-CM

## 2015-04-01 DIAGNOSIS — K036 Deposits [accretions] on teeth: Secondary | ICD-10-CM

## 2015-04-01 DIAGNOSIS — K030 Excessive attrition of teeth: Secondary | ICD-10-CM

## 2015-04-01 DIAGNOSIS — K029 Dental caries, unspecified: Secondary | ICD-10-CM

## 2015-04-01 NOTE — Patient Instructions (Addendum)
Patient wishes to proceed with dental extractions with alveoloplasty in the operating room with general anesthesia after his anticipated aortic valve replacement. Patient to contact dental medicine if acute problems arise before then. Plan will be scheduled for dental operating room procedure approximately one to 2 months after the anticipated aortic valve replacement. Dr. Kristin Bruins

## 2015-04-01 NOTE — Pre-Procedure Instructions (Signed)
    [name]  04/01/2015    Your procedure is scheduled on Tuesday, September 13.  Report to [facility] Admitting at  A.M.  Call this number if you have problems the morning of surgery: [phone]   Remember:  Do not eat food or drink liquids after midnight  Monday, September 12.  Take these medicines the morning of surgery with A SIP OF WATER : allopurinol (ZYLOPRIM), if heart rate is high -amLODipine (NORVASC). buPROPion (WELLBUTRIN,  metoprolol succinate (TOPROL-XL),  pantoprazole (PROTONIX).               If needed: acetaminophen (TYLENOL), LORazepam (ATIVAN).             Aspirin and Plavix stop as per instructions of Dr Cornelius Moras.   Do not wear jewelry, make-up or nail polish.   Do not wear lotions, powders, or perfumes.  You may wear deodorant.   Do not shave 48 hours prior to surgery.  Men may shave face and neck.   Do not bring valuables to the hospital.   [facility] is not responsible for any belongings or valuables.   Contacts, dentures or bridgework may not be worn into surgery.  Leave your suitcase in the car.  After surgery it may be brought to your room.  For patients admitted to the hospital, discharge time will be determined by your treatment team.   Special instructions: Review  [facility] - Preparing For Surgery.  Please read over the following fact sheets that you were given. Pain Booklet, Coughing and Deep Breathing, Blood Transfusion Information and Surgical Site Infection Prevention and Incentive Spirometry.

## 2015-04-01 NOTE — Progress Notes (Signed)
DENTAL CONSULTATION  Date of Consultation:  04/01/2015 Patient Name:   [name] Date of Birth:   05-Dec-1932 Medical Record Number: [id number]  VITALS: BP 152/57 mmHg  Pulse 64  Temp(Src) 97.5 F (36.4 C) (Oral)  CHIEF COMPLAINT: Patient referred by Dr. Cornelius Moras for dental consultation.  HPI: [name] is an 81 year old male recently diagnosed with severe aortic stenosis. Patient with anticipated aortic valve replacement with Dr. Cornelius Moras. Patient now seen as part of medically necessary pre-heart valve surgery dental protocol examination.  The patient currently denies acute toothaches, swellings, or abscesses. Patient last saw a dentist approximately 5 years ago to have several dental extractions. This was with Dr. Mayford Knife. Patient denies having any complications from those dental extractions. Patient does not seek regular dental care "unless I need to". Patient denies having any partial dentures.  PROBLEM LIST: Patient Active Problem List   Diagnosis Date Noted  . Aortic stenosis, severe 03/30/2015  . Aortic stenosis 03/15/2015  . Severe aortic stenosis   . Allergic drug rash 03/04/2015  . Staphylococcus epidermidis bacteremia   . Pressure ulcer 01/20/2015  . Endotracheally intubated   . Encephalopathy   . Acute respiratory failure   . Elevated troponin   . Altered mental status   . Abnormal involuntary movements   . Acute respiratory failure with hypoxia 01/09/2015  . Chronic diastolic heart failure 07/28/2014  . Obesity hypoventilation syndrome   . CKD (chronic kidney disease), stage III 02/08/2014  . Venous stasis ulcer 07/08/2013  . Chronic venous insufficiency 06/26/2013  . CAD (coronary artery disease)   . Lumbar disc disease   . Aortic stenosis   . BPH (benign prostatic hypertrophy)   . Obstructive sleep apnea, adult 08/23/2010  . Chronic depression   . Anxiety state 06/27/2010  . Type 2 diabetes mellitus with vascular disease   . Personal history of TIA   without residual deficit   . GERD   . Tophacious gout   . Obesity (BMI 30-39.9)   . Dyslipidemia   . Restless leg syndrome   . Hypertensive heart disease   . Degenerative joint disease   . Carotid artery disease   . History of pericardial window for staph pericarditis 05/29/2006    PMH: Past Medical History  Diagnosis Date  . Gout, unspecified     severe dz, "treatment done that last 15 years"  . Obesity (BMI 30-39.9)   . Restless leg syndrome   . History of retinal detachment 1994  . Hypertensive heart disease   . CAD (coronary artery disease), native coronary artery   . History of pericarditis 2007    MSSA, s/p pericardial window  . GERD   . Osteoarthritis   . Sleep apnea in adult     CPAP qhs  . TIA (transient ischemic attack) 2005  . BPH (benign prostatic hypertrophy)     "minor"  . Lumbar spinal stenosis   . Carotid artery occlusion   . Unspecified venous (peripheral) insufficiency   . Stroke May 2003  . Anxiety   . Depression   . Aortic stenosis     ECHO 05/29/14 moderate AS Mean gradient 21 mm EF 55% ECHO 07/09/13  Mean gradient 12 mm Mild to moderate AS EF 50%    . CAD (coronary artery disease)     02/11/09  Xience stent to circumflex 2.5 x 18 mm postdilated to 2.75 mm  Cath sept 2011  normal Left main, 20 % stenosis proximal LAD, 40% stenosis mid LAD, 70% stenosis proximal  Diag 1, 90% stenosis prox CFX, 90% first OM, small and nondominant RCA;   . Carotid artery disease     Prior TIA 2006 with left CEA by Dr. Arbie Cookey Recurrent TIA in April 2011   . Lumbar disc disease   . CKD (chronic kidney disease), stage III 02/08/2014  . CHF (congestive heart failure)   . Renal insufficiency   . Hypertension   . Type 2 diabetes mellitus with vascular disease     Takes no meds    PSH: Past Surgical History  Procedure Laterality Date  . Pericardial window  2007  . Left arm  2008    shoulder  . Right arm  1970's    shoulder  . Cataract extraction      Left side x's 2  and right  . Retinal detachment surgery      left side  . Carotid endarterectomy Left 12-06-05    cea  . Coronary angioplasty  01/2009    x 1 stent  . Colonoscopy    . Carpal tunnel release Bilateral   . Back surgery  2013    removed bone spurs  . Inguinal hernia repair Left 10/01/2013    Procedure: HERNIA REPAIR INGUINAL ADULT;  Surgeon: Axel Filler, MD;  Location: [facility];  Service: General;  Laterality: Left;  . Insertion of mesh Left 10/01/2013    Procedure: INSERTION OF MESH;  Surgeon: Axel Filler, MD;  Location: [facility];  Service: General;  Laterality: Left;  . Finger amputation  May 2015    JUST TO FIRST JOINT RIGHT HAND  LAST FINGER ( Right 5th finger)  . Peripheral vascular catheterization N/A 01/08/2015    Procedure: Abdominal Aortogram;  Surgeon: Chuck Hint, MD;  Location: [facility] INVASIVE CV LAB;  Service: Cardiovascular;  Laterality: N/A;  . Cardiac catheterization N/A 03/15/2015    Procedure: Right/Left Heart Cath and Coronary Angiography;  Surgeon: Tonny Bollman, MD;  Location: [facility] INVASIVE CV LAB;  Service: Cardiovascular;  Laterality: N/A;    ALLERGIES: Allergies  Allergen Reactions  . Statins Other (See Comments)    Severe leg myalgias, weakness    MEDICATIONS: Current Outpatient Prescriptions  Medication Sig Dispense Refill  . acetaminophen (TYLENOL) 325 MG tablet Take 2 tablets (650 mg total) by mouth every 6 (six) hours as needed for mild pain or moderate pain. (Patient taking differently: Take 325 mg by mouth every 6 (six) hours as needed for mild pain or moderate pain. )    . allopurinol (ZYLOPRIM) 300 MG tablet Take 1 tablet (300 mg total) by mouth every morning. 90 tablet 1  . amLODipine (NORVASC) 5 MG tablet Take 5 mg by mouth daily as needed (Pt states he only takes if Heart Rate is high).    Marland Kitchen aspirin EC 81 MG tablet Take 81 mg by mouth daily.    Marland Kitchen buPROPion (WELLBUTRIN XL) 150 MG 24 hr tablet Take 1 tablet (150 mg total) by mouth every morning.  90 tablet 1  . cholecalciferol (VITAMIN D) 1000 UNITS tablet Take 1,000 Units by mouth daily.    Marland Kitchen docusate sodium 100 MG CAPS Take 100 mg by mouth 2 (two) times daily. 10 capsule 0  . Ferrous Gluconate 256 (28 FE) MG TABS Take 256 mg by mouth daily.    . furosemide (LASIX) 40 MG tablet Take 1 tablet (40 mg total) by mouth daily.    Marland Kitchen LORazepam (ATIVAN) 1 MG tablet TAKE ONE TABLET BY MOUTH EVERY 8 HOURS AS NEEDED FOR ANXIETY/SLEEP  30 tablet 0  . Magnesium 250 MG TABS Take 250 mg by mouth daily.     . metoprolol succinate (TOPROL-XL) 50 MG 24 hr tablet Take 1 tablet (50 mg total) by mouth daily. Take with or immediately following a meal. 90 tablet 3  . mirabegron ER (MYRBETRIQ) 50 MG TB24 tablet Take 1 tablet (50 mg total) by mouth daily. 90 tablet 1  . mirtazapine (REMERON) 15 MG tablet Take 1 tablet (15 mg total) by mouth at bedtime. 30 tablet 11  . pantoprazole (PROTONIX) 40 MG tablet Take 1 tablet (40 mg total) by mouth 2 (two) times daily. 90 tablet 1  . pregabalin (LYRICA) 200 MG capsule Take 1 capsule (200 mg total) by mouth at bedtime. 90 capsule 1  . rOPINIRole (REQUIP) 4 MG tablet Take 1 tablet (4 mg total) by mouth at bedtime. 90 tablet 1  . tamsulosin (FLOMAX) 0.4 MG CAPS capsule Take 1 capsule (0.4 mg total) by mouth daily. 90 capsule 3  . clopidogrel (PLAVIX) 75 MG tablet Take 1 tablet (75 mg total) by mouth daily. (Patient not taking: Reported on 04/01/2015) 90 tablet 3   No current facility-administered medications for this visit.    LABS: Lab Results  Component Value Date   WBC 6.8 03/26/2015   HGB 12.9* 03/26/2015   HCT 40.3 03/26/2015   MCV 93.1 03/26/2015   PLT 121* 03/26/2015      Component Value Date/Time   NA 144 03/30/2015 1101   K 4.7 03/30/2015 1101   CL 103 03/30/2015 1101   CO2 31 03/30/2015 1101   GLUCOSE 119* 03/30/2015 1101   BUN 24 03/30/2015 1101   CREATININE 1.52* 03/30/2015 1101   CREATININE 1.37* 03/27/2015 0434   CALCIUM 9.2 03/30/2015 1101    GFRNONAA 43* 03/30/2015 1101   GFRNONAA 47* 03/27/2015 0434   GFRAA 49* 03/30/2015 1101   GFRAA 55* 03/27/2015 0434   Lab Results  Component Value Date   INR 0.98 03/12/2015   INR 1.07 05/29/2014   INR 0.96 04/15/2010   No results found for: PTT  SOCIAL HISTORY: Social History   Social History  . Marital Status: Widowed    Spouse Name: N/A  . Number of Children: 5  . Years of Education: N/A   Occupational History  . Not on file.   Social History Main Topics  . Smoking status: Never Smoker   . Smokeless tobacco: Never Used  . Alcohol Use: No  . Drug Use: No  . Sexual Activity: Not on file   Other Topics Concern  . Not on file   Social History Narrative   Lives alone     FAMILY HISTORY: Family History  Problem Relation Age of Onset  . Heart disease Mother     Before age 8  . Heart attack Mother   . Diabetes Father   . Heart disease Father   . Breast cancer Sister   . Cancer Sister   . Prostate cancer Brother   . Lung cancer Brother   . Cancer Brother   . Diabetes Daughter   . Diabetes Son   . Hypertension Son     REVIEW OF SYSTEMS: Reviewed from Dr. Orvan July note and reviewed with the patient today. Review of Systems:  General:normal appetite, decreased energy, no weight gain, no weight loss, no fever Cardiac:no chest pain with exertion, no chest pain at rest, +SOB with minimal exertion, + intermittent resting SOB, + PND, + orthopnea, + palpitations, no arrhythmia, no atrial fibrillation,  no LE edema, + dizzy spells, no syncope Respiratory:+ shortness of breath, no home oxygen, no productive cough, no dry cough, no bronchitis, no wheezing, no hemoptysis, no asthma, no pain with inspiration or cough, + sleep apnea, + CPAP at night GI:+ occasional difficulty swallowing, no reflux, no frequent heartburn, no hiatal hernia,  no abdominal pain, no constipation, no diarrhea, no hematochezia, no hematemesis, no melena GU:no dysuria, + frequency, no urinary tract infection, no hematuria, + enlarged prostate, no kidney stones, + kidney disease Vascular:no pain suggestive of claudication, no pain in feet, no leg cramps, no varicose veins, no DVT, no non-healing foot ulcer Neuro:+ stroke, + TIA's, no seizures, no headaches, no temporary blindness one eye, no slurred speech, no peripheral neuropathy, no chronic pain, no instability of gait, no memory/cognitive dysfunction Musculoskeletal:+ arthritis particularly in hips, no joint swelling, no myalgias, no difficulty walking, normal mobility  Skin:no rash, no itching, no skin infections, no pressure sores or ulcerations Psych:no anxiety, no depression, no nervousness, no unusual recent stress Eyes:no blurry vision, no floaters, no recent vision changes, wears glasses for reading ENT:no hearing loss, poor dentition but no loose or painful teeth, no dentures, last saw dentist more than 5 years ago Hematologic:+ easy bruising, no abnormal bleeding, no clotting disorder, no frequent epistaxis Endocrine:+ diabetes, does not check CBG's at home  DENTAL HISTORY: CHIEF COMPLAINT: Patient referred by Dr. Cornelius Moras for dental consultation.  HPI: [name] is an 81 year old male recently diagnosed with severe aortic stenosis. Patient with anticipated aortic valve replacement with Dr. Cornelius Moras. Patient now seen as part of medically necessary pre-heart valve surgery dental protocol examination.  The patient currently denies acute toothaches,  swellings, or abscesses. Patient last saw a dentist approximately 5 years ago to have several dental extractions. This was with Dr. Mayford Knife. Patient denies having any complications from those dental extractions. Patient does not seek regular dental care "unless I need to". Patient denies having any partial dentures.  DENTAL EXAMINATION: GENERAL: The patient is a well-developed, well-nourished male in no acute distress. HEAD AND NECK: There is no palpable submandibular lymphadenopathy. The patient denies acute TMJ symptoms. INTRAORAL EXAM: The patient has normal saliva. There is no evidence of oral abscess formation. DENTITION: The patient is missing multiple teeth numbers 1-9,13-19, and 30-32. PERIODONTAL: The patient has chronic periodontitis with plaque and calculus accumulations, generalized gingival recession, and no significant tooth mobility. Patient has incipient to moderate bone loss. DENTAL CARIES/SUBOPTIMAL RESTORATIONS: Multiple dental caries are noted per dental charting form. ENDODONTIC: Patient denies acute pulpitis symptoms. I do not see any evidence of periapical pathology. CROWN AND BRIDGE:  There are no crown or bridge restorations. PROSTHODONTIC: The patient denies having partial dentures. OCCLUSION: Patient has a poor occlusal scheme secondary to multiple missing teeth, supra-eruption and drifting of the unopposed teeth into the edentulous areas, and incisal attrition, and acquired class III malocclusion.  RADIOGRAPHIC INTERPRETATION: An orthopantogram was unable to be taken. Patient had 8 periapical radiographs taken. There are multiple missing teeth. There is a retained root segment in the area #10. Multiple dental caries are noted. There is incipient to moderate bone loss. There is supra-eruption and drifting of the unopposed teeth into the edentulous areas. Multiple diastemas are noted. There is no evidence of periapical pathology or radiolucency. Radiographic calculus is  noted.   ASSESSMENTS: 1. Severe aortic stenosis 2. Coronary artery disease 3. Pre-heart valve surgery dental protocol examination 4. Dental Caries 5. Incisal attrition 6. Retained root segment #10 7. Chronic periodontitis with  bone loss 8. Gingival recession 9. Accretions 10. Multiple missing teeth 11. Malocclusion 12. Risk for bleeding with invasive dental procedures with Plavix therapy 13. Risk for complications up to and including death due to overall cardiovascular and respiratory compromise.   PLAN/RECOMMENDATIONS: 1. I discussed the risks, benefits, and complications of various treatment options with the patient in relationship to his medical and dental conditions, anticipated aortic valve replacement, and risk for endocarditis.  We discussed various treatment options to include no treatment, multiple extractions with alveoloplasty, pre-prosthetic surgery as indicated, periodontal therapy, dental restorations, root canal therapy, crown and bridge therapy, implant therapy, and replacement of missing teeth as indicated. The patient currently wishes to defer any dental treatment until after his aortic valve replacement. At that time, the patient would like to proceed with extraction of all remaining teeth with alveoloplasty in the operating room with general anesthesia. The patient will then follow-up with a dentist of his choice for fabrication of upper and lower complete dentures after adequate healing.   Patient understands that the dental treatment most likely will take place approximately one to 2 months after his anticipated aortic valve replacement and once he is medically stable. Patient understands that he will need antibiotic premedication prior to invasive dental procedures due to the anticipated aortic valve replacement.   2. Discussion of findings with medical team and coordination of future medical and dental care as needed.  I spent in excess of  120 minutes during the  conduct of this consultation and >50% of this time involved direct face-to-face encounter for counseling and/or coordination of the patient's care.    Charlynne Pander, DDS

## 2015-04-01 NOTE — Telephone Encounter (Signed)
LVM for pt to return call

## 2015-04-02 ENCOUNTER — Encounter (HOSPITAL_COMMUNITY)
Admission: RE | Admit: 2015-04-02 | Discharge: 2015-04-02 | Disposition: A | Discharge status: Home / Self Care | Payer: Medicare Other | Source: Ambulatory Visit | Attending: Cardiovascular Disease | Admitting: Cardiovascular Disease

## 2015-04-02 ENCOUNTER — Institutional Professional Consult (permissible substitution) (INDEPENDENT_AMBULATORY_CARE_PROVIDER_SITE_OTHER): Payer: Medicare Other | Admitting: Surgery

## 2015-04-02 ENCOUNTER — Ambulatory Visit (HOSPITAL_COMMUNITY)
Admission: RE | Admit: 2015-04-02 | Discharge: 2015-04-02 | Disposition: A | Discharge status: Home / Self Care | Payer: Medicare Other | Source: Ambulatory Visit | Attending: Cardiovascular Disease | Admitting: Cardiovascular Disease

## 2015-04-02 ENCOUNTER — Encounter: Payer: Self-pay | Admitting: Surgery

## 2015-04-02 ENCOUNTER — Telehealth: Payer: Self-pay | Admitting: Pulmonary Disease

## 2015-04-02 VITALS — BP 119/44 | HR 66 | Temp 97.9°F | Ht 70.0 in | Wt 230.0 lb

## 2015-04-02 VITALS — BP 115/63 | HR 65 | Resp 22 | Ht 70.0 in | Wt 230.0 lb

## 2015-04-02 DIAGNOSIS — E1122 Type 2 diabetes mellitus with diabetic chronic kidney disease: Secondary | ICD-10-CM | POA: Diagnosis not present

## 2015-04-02 DIAGNOSIS — I509 Heart failure, unspecified: Secondary | ICD-10-CM | POA: Diagnosis not present

## 2015-04-02 DIAGNOSIS — M5134 Other intervertebral disc degeneration, thoracic region: Secondary | ICD-10-CM | POA: Diagnosis not present

## 2015-04-02 DIAGNOSIS — Z01812 Encounter for preprocedural laboratory examination: Secondary | ICD-10-CM | POA: Diagnosis not present

## 2015-04-02 DIAGNOSIS — Z01818 Encounter for other preprocedural examination: Secondary | ICD-10-CM | POA: Diagnosis present

## 2015-04-02 DIAGNOSIS — I350 Nonrheumatic aortic (valve) stenosis: Secondary | ICD-10-CM | POA: Diagnosis not present

## 2015-04-02 DIAGNOSIS — I2510 Atherosclerotic heart disease of native coronary artery without angina pectoris: Secondary | ICD-10-CM

## 2015-04-02 DIAGNOSIS — Z8673 Personal history of transient ischemic attack (TIA), and cerebral infarction without residual deficits: Secondary | ICD-10-CM | POA: Diagnosis not present

## 2015-04-02 DIAGNOSIS — I872 Venous insufficiency (chronic) (peripheral): Secondary | ICD-10-CM | POA: Insufficient documentation

## 2015-04-02 DIAGNOSIS — Z0183 Encounter for blood typing: Secondary | ICD-10-CM | POA: Diagnosis not present

## 2015-04-02 DIAGNOSIS — I5032 Chronic diastolic (congestive) heart failure: Secondary | ICD-10-CM | POA: Diagnosis not present

## 2015-04-02 DIAGNOSIS — I2583 Coronary atherosclerosis due to lipid rich plaque: Secondary | ICD-10-CM

## 2015-04-02 DIAGNOSIS — G4733 Obstructive sleep apnea (adult) (pediatric): Secondary | ICD-10-CM | POA: Diagnosis not present

## 2015-04-02 DIAGNOSIS — I517 Cardiomegaly: Secondary | ICD-10-CM | POA: Diagnosis not present

## 2015-04-02 DIAGNOSIS — G2581 Restless legs syndrome: Secondary | ICD-10-CM | POA: Diagnosis not present

## 2015-04-02 DIAGNOSIS — N183 Chronic kidney disease, stage 3 (moderate): Secondary | ICD-10-CM | POA: Insufficient documentation

## 2015-04-02 DIAGNOSIS — R0600 Dyspnea, unspecified: Secondary | ICD-10-CM

## 2015-04-02 DIAGNOSIS — K219 Gastro-esophageal reflux disease without esophagitis: Secondary | ICD-10-CM | POA: Insufficient documentation

## 2015-04-02 DIAGNOSIS — I129 Hypertensive chronic kidney disease with stage 1 through stage 4 chronic kidney disease, or unspecified chronic kidney disease: Secondary | ICD-10-CM | POA: Insufficient documentation

## 2015-04-02 DIAGNOSIS — N289 Disorder of kidney and ureter, unspecified: Secondary | ICD-10-CM

## 2015-04-02 LAB — COMPREHENSIVE METABOLIC PANEL
ALK PHOS: 110 U/L (ref 38–126)
ALT: 18 U/L (ref 17–63)
AST: 33 U/L (ref 15–41)
Albumin: 3.8 g/dL (ref 3.5–5.0)
Anion gap: 15 (ref 5–15)
BUN: 28 mg/dL — ABNORMAL HIGH (ref 6–20)
CO2: 22 mmol/L (ref 22–32)
Calcium: 9 mg/dL (ref 8.9–10.3)
Chloride: 101 mmol/L (ref 101–111)
Creatinine, Ser: 1.68 mg/dL — ABNORMAL HIGH (ref 0.61–1.24)
GFR, EST AFRICAN AMERICAN: 43 mL/min — AB (ref >60)
GFR, EST NON AFRICAN AMERICAN: 37 mL/min — AB (ref >60)
GLUCOSE: 153 mg/dL — AB (ref 65–99)
POTASSIUM: 4.7 mmol/L (ref 3.5–5.1)
Sodium: 138 mmol/L (ref 135–145)
TOTAL PROTEIN: 7.9 g/dL (ref 6.5–8.1)
Total Bilirubin: 0.8 mg/dL (ref 0.3–1.2)

## 2015-04-02 LAB — CBC
HEMATOCRIT: 43 % (ref 39.0–52.0)
HEMOGLOBIN: 14.1 g/dL (ref 13.0–17.0)
MCH: 30.9 pg (ref 26.0–34.0)
MCHC: 32.8 g/dL (ref 30.0–36.0)
MCV: 94.1 fL (ref 78.0–100.0)
Platelets: 151 10*3/uL (ref 150–400)
RBC: 4.57 MIL/uL (ref 4.22–5.81)
RDW: 15.9 % — ABNORMAL HIGH (ref 11.5–15.5)
WBC: 6.8 10*3/uL (ref 4.0–10.5)

## 2015-04-02 LAB — BLOOD GAS, ARTERIAL
Acid-Base Excess: 1.1 mmol/L (ref 0.0–2.0)
Bicarbonate: 25.8 mEq/L — ABNORMAL HIGH (ref 20.0–24.0)
DRAWN BY: 206361
FIO2: 0.21
O2 Saturation: 92.1 %
PCO2 ART: 45.8 mmHg — AB (ref 35.0–45.0)
PH ART: 7.37 (ref 7.350–7.450)
PO2 ART: 66.5 mmHg — AB (ref 80.0–100.0)
Patient temperature: 98.6
TCO2: 27.2 mmol/L (ref 0–100)

## 2015-04-02 LAB — URINALYSIS, ROUTINE W REFLEX MICROSCOPIC
Bilirubin Urine: NEGATIVE
Glucose, UA: NEGATIVE mg/dL
HGB URINE DIPSTICK: NEGATIVE
Ketones, ur: NEGATIVE mg/dL
Leukocytes, UA: NEGATIVE
Nitrite: NEGATIVE
PROTEIN: NEGATIVE mg/dL
Specific Gravity, Urine: 1.012 (ref 1.005–1.030)
UROBILINOGEN UA: 0.2 mg/dL (ref 0.0–1.0)
pH: 5 (ref 5.0–8.0)

## 2015-04-02 LAB — GLUCOSE, CAPILLARY: GLUCOSE-CAPILLARY: 124 mg/dL — AB (ref 65–99)

## 2015-04-02 LAB — SURGICAL PCR SCREEN
MRSA, PCR: NEGATIVE
STAPHYLOCOCCUS AUREUS: NEGATIVE

## 2015-04-02 LAB — APTT: APTT: 30 s (ref 24–37)

## 2015-04-02 LAB — PROTIME-INR
INR: 1.06 (ref 0.00–1.49)
Prothrombin Time: 14 seconds (ref 11.6–15.2)

## 2015-04-02 MED ORDER — CHLORHEXIDINE GLUCONATE 4 % EX LIQD: 30.0000 mL | CUTANEOUS | Status: DC

## 2015-04-02 NOTE — Telephone Encounter (Signed)
Left message for pt to call back  °

## 2015-04-02 NOTE — Progress Notes (Signed)
Patient ID: [name], male   DOB: May 10, 1933, 81 y.o.   MRN: [id number]   [facility]  MULTID[facility]IPLIN[facility] VALVE CLINIC    CARDIOTHORACIC SURGERY CONSULTATION REPORT  Referring Provider is Mason Boyer, MD PCP is Mason Paci, MD  Chief Complaint  Patient presents with  . Follow-up    for 2ND TAVR EVAL for SEVERE AORTIC STENOSIS    HPI:  The patient is an 81 year old gentleman with a history of obesity, type 2 DM, stage 3 chronic kidney disease, OSA, known coronary artery disease s/p MI x 2 in the past and PCI with stenting of the OM1 in 2010, and aortic stenosis. He has done well until the past year and has been hospitalized 3 times since Nov 2015 with acute on chronic diastolic heart failure. He had a prolonged hospitalization from 01/09/2015 to 01/28/2015 with another acute exacerbation of chronic diastolic congestive heart failure complicated by hypoxemic respiratory failure requiring a fairly prolonged course of mechanical ventilation.Transthoracic echocardiogram performed at that time demonstrated the presence of moderate to severe aortic stenosis with peak velocity across the aortic valve measured 3.9 m/s corresponding to mean transvalvular gradient estimated at 32 mmHg. Left ventricular systolic function was preserved with ejection fraction estimated at 55-60%. During that hospitalization he developed febrile illness associated with coag-negative Staphylococcus bacteremia for which he was treated with a 4 week course of intravenous antibiotics. The patient never had a transesophageal echocardiogram performed, although transthoracic echocardiograms did not reveal any findings to suggest the presence of bacterial endocarditis. Blood cultures cleared on anabiotic therapy and subsequent follow-up blood cultures performed after completion of treatment were negative. The patient gradually recovered from his prolonged hospitalization and spent 3 weeks at a  skilled nursing facility in July before returning to independent living. He underwent cardiac cath by Dr. Excell [name] on 03/15/2015 showing 40% ostial and 60% mid RCA stenoses. The LAD was heavily calcified but had non-obstructive disease. There was 50% ostial LCX stenosis with continued patency of the stented segment of the OM1. Catheterization confirmed the presence of severe aortic stenosis with mean transvalvular gradient measured 38 mmHg corresponding to aortic valve area calculated 0.83 cm. He was scheduled for surgical consultation in our office but before that visit was readmitted with another exacerbation of CHF that quickly resolved. He is now at home and has stable dyspnea and fatigue with minimal exertion.   Past Medical History  Diagnosis Date  . Gout, unspecified     severe dz, "treatment done that last 15 years"  . Obesity (BMI 30-39.9)   . Restless leg syndrome   . History of retinal detachment 1994  . Hypertensive heart disease   . CAD (coronary artery disease), native coronary artery   . History of pericarditis 2007    MSSA, s/p pericardial window  . GERD   . Osteoarthritis   . Sleep apnea in adult     CPAP qhs  . TIA (transient ischemic attack) 2005  . BPH (benign prostatic hypertrophy)     "minor"  . Lumbar spinal stenosis   . Carotid artery occlusion   . Unspecified venous (peripheral) insufficiency   . Stroke May 2003  . Anxiety   . Depression   . Aortic stenosis     ECHO 05/29/14 moderate AS Mean gradient 21 mm EF 55% ECHO 07/09/13  Mean gradient 12 mm Mild to moderate AS EF 50%    . CAD (coronary artery disease)     02/11/09  Xience stent  to circumflex 2.5 x 18 mm postdilated to 2.75 mm  Cath sept 2011  normal Left main, 20 % stenosis proximal LAD, 40% stenosis mid LAD, 70% stenosis proximal Diag 1, 90% stenosis prox CFX, 90% first OM, small and nondominant RCA;   . Carotid artery disease     Prior TIA 2006 with left CEA by Dr. Arbie [name] Recurrent TIA in April 2011   .  Lumbar disc disease   . CKD (chronic kidney disease), stage III 02/08/2014  . CHF (congestive heart failure)   . Renal insufficiency   . Hypertension   . Type 2 diabetes mellitus with vascular disease     Takes no meds    Past Surgical History  Procedure Laterality Date  . Pericardial window  2007  . Left arm  2008    shoulder  . Right arm  1970's    shoulder  . Cataract extraction      Left side x's 2 and right  . Retinal detachment surgery      left side  . Carotid endarterectomy Left 12-06-05    cea  . Coronary angioplasty  01/2009    x 1 stent  . Colonoscopy    . Carpal tunnel release Bilateral   . Back surgery  2013    removed bone spurs  . Inguinal hernia repair Left 10/01/2013    Procedure: HERNIA REPAIR INGUINAL ADULT;  Surgeon: Mason Filler, MD;  Location: [facility];  Service: General;  Laterality: Left;  . Insertion of mesh Left 10/01/2013    Procedure: INSERTION OF MESH;  Surgeon: Mason Filler, MD;  Location: [facility];  Service: General;  Laterality: Left;  . Finger amputation  May 2015    JUST TO FIRST JOINT RIGHT HAND  LAST FINGER ( Right 5th finger)  . Peripheral vascular catheterization N/A 01/08/2015    Procedure: Abdominal Aortogram;  Surgeon: Mason Hint, MD;  Location: [facility] INVASIVE CV LAB;  Service: Cardiovascular;  Laterality: N/A;  . Cardiac catheterization N/A 03/15/2015    Procedure: Right/Left Heart Cath and Coronary Angiography;  Surgeon: Mason Bollman, MD;  Location: [facility] INVASIVE CV LAB;  Service: Cardiovascular;  Laterality: N/A;    Family History  Problem Relation Age of Onset  . Heart disease Mother     Before age 101  . Heart attack Mother   . Diabetes Father   . Heart disease Father   . Breast cancer Sister   . Cancer Sister   . Prostate cancer Brother   . Lung cancer Brother   . Cancer Brother   . Diabetes Daughter   . Diabetes Son   . Hypertension Son     Social History   Social History  . Marital Status: Widowed     Spouse Name: N/A  . Number of Children: 5  . Years of Education: N/A   Occupational History  . Not on file.   Social History Main Topics  . Smoking status: Never Smoker   . Smokeless tobacco: Never Used  . Alcohol Use: No  . Drug Use: No  . Sexual Activity: Not on file   Other Topics Concern  . Not on file   Social History Narrative   Lives alone     Current Outpatient Prescriptions  Medication Sig Dispense Refill  . acetaminophen (TYLENOL) 325 MG tablet Take 2 tablets (650 mg total) by mouth every 6 (six) hours as needed for mild pain or moderate pain. (Patient taking differently: Take 325 mg by mouth every 6 (  six) hours as needed for mild pain or moderate pain. )    . allopurinol (ZYLOPRIM) 300 MG tablet Take 1 tablet (300 mg total) by mouth every morning. 90 tablet 1  . amLODipine (NORVASC) 5 MG tablet Take 5 mg by mouth daily as needed (Pt states he only takes if Heart Rate is high).    Marland Kitchen aspirin EC 81 MG tablet Take 81 mg by mouth daily.    Marland Kitchen buPROPion (WELLBUTRIN XL) 150 MG 24 hr tablet Take 1 tablet (150 mg total) by mouth every morning. 90 tablet 1  . cholecalciferol (VITAMIN D) 1000 UNITS tablet Take 1,000 Units by mouth daily.    Marland Kitchen docusate sodium 100 MG CAPS Take 100 mg by mouth 2 (two) times daily. 10 capsule 0  . Ferrous Gluconate 256 (28 FE) MG TABS Take 256 mg by mouth daily.    . furosemide (LASIX) 40 MG tablet Take 1 tablet (40 mg total) by mouth daily.    Marland Kitchen LORazepam (ATIVAN) 1 MG tablet TAKE ONE TABLET BY MOUTH EVERY 8 HOURS AS NEEDED FOR ANXIETY/SLEEP 30 tablet 0  . Magnesium 250 MG TABS Take 250 mg by mouth daily.     . metoprolol succinate (TOPROL-XL) 50 MG 24 hr tablet Take 1 tablet (50 mg total) by mouth daily. Take with or immediately following a meal. 90 tablet 3  . mirabegron ER (MYRBETRIQ) 50 MG TB24 tablet Take 1 tablet (50 mg total) by mouth daily. 90 tablet 1  . mirtazapine (REMERON) 15 MG tablet Take 1 tablet (15 mg total) by mouth at bedtime. 30  tablet 11  . pantoprazole (PROTONIX) 40 MG tablet Take 1 tablet (40 mg total) by mouth 2 (two) times daily. 90 tablet 1  . pregabalin (LYRICA) 200 MG capsule Take 1 capsule (200 mg total) by mouth at bedtime. 90 capsule 1  . rOPINIRole (REQUIP) 4 MG tablet Take 1 tablet (4 mg total) by mouth at bedtime. 90 tablet 1  . tamsulosin (FLOMAX) 0.4 MG CAPS capsule Take 1 capsule (0.4 mg total) by mouth daily. 90 capsule 3  . clopidogrel (PLAVIX) 75 MG tablet Take 1 tablet (75 mg total) by mouth daily. (Patient not taking: Reported on 04/01/2015) 90 tablet 3   No current facility-administered medications for this visit.   Facility-Administered Medications Ordered in Other Visits  Medication Dose Route Frequency Provider Last Rate Last Dose  . chlorhexidine (HIBICLENS) 4 % liquid 2 application  30 mL Topical UD Mason Bollman, MD        Allergies  Allergen Reactions  . Statins Other (See Comments)    Severe leg myalgias, weakness      Review of Systems:  General:normal appetite, decreased energy, no weight gain, no weight loss, no fever Cardiac:no chest pain with exertion, no chest pain at rest, +SOB with minimal exertion, + intermittent resting SOB, + PND, + orthopnea, + palpitations, no arrhythmia, no atrial fibrillation, no LE edema, + dizzy spells, no syncope Respiratory:+ shortness of breath, no home oxygen, no productive cough, no dry cough, no bronchitis, no wheezing, no hemoptysis, no asthma, no pain with inspiration or cough, + sleep apnea, + CPAP at night GI:+ occasional difficulty swallowing, no reflux, no frequent heartburn, no hiatal hernia, no abdominal pain, no constipation, no diarrhea, no hematochezia, no hematemesis, no melena GU:no dysuria, + frequency, no urinary tract infection, no hematuria, + enlarged  prostate, no kidney stones, + kidney disease Vascular:no pain suggestive of claudication, no pain in feet, no leg cramps,  no varicose veins, no DVT, no non-healing foot ulcer Neuro:+ stroke, + TIA's, no seizures, no headaches, no temporary blindness one eye, no slurred speech, no peripheral neuropathy, no chronic pain, no instability of gait, no memory/cognitive dysfunction Musculoskeletal:+ arthritis particularly in hips, no joint swelling, no myalgias, no difficulty walking, normal mobility  Skin:no rash, no itching, no skin infections, no pressure sores or ulcerations Psych:no anxiety, no depression, no nervousness, no unusual recent stress Eyes:no blurry vision, no floaters, no recent vision changes, wears glasses for reading ENT:no hearing loss, poor dentition but no loose or painful teeth, no dentures, last saw dentist more than 5 years ago Hematologic:+ easy bruising, no abnormal bleeding, no clotting disorder, no frequent epistaxis Endocrine:+ diabetes, does not check CBG's at home           Physical Exam:   BP 115/63 mmHg  Pulse 65  Resp 22  Ht 5\' 10"  (1.778 m)  Wt 230 lb (104.327 kg)  BMI 33.00 kg/m2  SpO2 95%  General:  Elderly, chronically ill -appearing gentleman in no distress  HEENT:  Unremarkable , NCAT, PERLA, EOMI, multiple missing teeth.  Neck:   no JVD, no bruits, no adenopathy or thyromegaly  Chest:   clear to auscultation, symmetrical breath sounds, no wheezes, no rhonchi   CV:   RRR, grade III/VI crescendo/decrescendo murmur heard best at RSB,  no diastolic murmur  Abdomen:  soft, non-tender, no masses or  organomegaly  Extremities:  warm, well-perfused, pulses not palpable in feet, no LE edema  Rectal/GU  Deferred  Neuro:   Grossly non-focal and symmetrical throughout  Skin:   Clean and dry, no rashes, no breakdown   Diagnostic Tests:   Transthoracic Echocardiography  Patient:  Dee, Maday MR #:    [id number] Study Date: 01/09/2015 Gender:   M Age:    37 Height:   175.3 cm Weight:   114.3 kg BSA:    2.41 m^2 Pt. Status: Room:    3E15C  ATTENDING  Marisa Severin [id number] ADMITTING  Victorio Palm REFERRING  Hillary Bow SONOGRAPHER Arvil Chaco PERFORMING  [facility], Inpatient  cc:  ------------------------------------------------------------------- LV EF: 55% -  60%  ------------------------------------------------------------------- Indications:   Murmur 785.2.  ------------------------------------------------------------------- Study Conclusions  - Left ventricle: The cavity size was normal. There was mild concentric hypertrophy. Systolic function was normal. The estimated ejection fraction was in the range of 55% to 60%. Although no diagnostic regional wall motion abnormality was identified, this possibility cannot be completely excluded on the basis of this study. Features are consistent with a pseudonormal left ventricular filling pattern, with concomitant abnormal relaxation and increased filling pressure (grade 2 diastolic dysfunction). - Aortic valve: Cusp separation was reduced. There was moderate stenosis. Peak velocity (S): 386 cm/s. Mean gradient (S): 32 mm Hg. - Mitral valve: Moderately calcified annulus. - Left atrium: The atrium was mildly to moderately dilated.  Impressions:  - When compared to prior echocardiogram, aortic stenosis has advanced.  Transthoracic echocardiography. M-mode, complete 2D, spectral Doppler, and color Doppler. Birthdate:  Patient birthdate: [birth year]. Age: Patient is 81 yr old. Sex: Gender: male. BMI: 37.2 kg/m^2. Blood pressure:   121/48 Patient status: Inpatient. Study date: Study date: 01/09/2015. Study time: 01:50 PM. Location: Bedside.  -------------------------------------------------------------------  ------------------------------------------------------------------- Left ventricle: The cavity size was normal. There was mild concentric hypertrophy. Systolic function was normal. The estimated ejection fraction was in the range of 55% to 60%. Although no diagnostic regional wall motion abnormality was  identified, this possibility cannot be completely excluded on the basis of this study. Features are consistent with a pseudonormal left ventricular filling pattern, with concomitant abnormal relaxation and increased filling pressure (grade 2 diastolic dysfunction).  ------------------------------------------------------------------- Aortic valve:  Moderately thickened, moderately calcified leaflets. Cusp separation was reduced. Doppler:  There was moderate stenosis.  There was no regurgitation.  VTI ratio of LVOT to aortic valve: 0.32. Valve area (VTI): 0.64 cm^2. Indexed valve area (VTI): 0.27 cm^2/m^2. Peak velocity ratio of LVOT to aortic valve: 0.34. Valve area (Vmax): 0.69 cm^2. Indexed valve area (Vmax): 0.29 cm^2/m^2. Mean velocity ratio of LVOT to aortic valve: 0.31. Valve area (Vmean): 0.63 cm^2. Indexed valve area (Vmean): 0.26 cm^2/m^2.  Mean gradient (S): 32 mm Hg. Peak gradient (S): 60 mm Hg.  ------------------------------------------------------------------- Aorta: The aorta was normal, not dilated, and non-diseased.  ------------------------------------------------------------------- Mitral valve:  Moderately calcified annulus. Leaflet separation was normal. Doppler: Transvalvular velocity was within the normal range. There was no evidence for  stenosis. There was no regurgitation.  Peak gradient (D): 10 mm Hg.  ------------------------------------------------------------------- Left atrium: The atrium was mildly to moderately dilated.  ------------------------------------------------------------------- Right ventricle: The cavity size was normal. Wall thickness was normal. Systolic function was normal.  ------------------------------------------------------------------- Pulmonic valve:  Poorly visualized. Structurally normal valve. Cusp separation was normal. Doppler: Transvalvular velocity was within the normal range. There was trivial regurgitation.  ------------------------------------------------------------------- Tricuspid valve:  Structurally normal valve.  Leaflet separation was normal. Doppler: Transvalvular velocity was within the normal range. There was trivial regurgitation.  ------------------------------------------------------------------- Pulmonary artery:  The main pulmonary artery was normal-sized.  ------------------------------------------------------------------- Right atrium: The atrium was normal in size.  ------------------------------------------------------------------- Pericardium: The pericardium was normal in appearance. There was no pericardial effusion.  ------------------------------------------------------------------- Systemic veins: Inferior vena cava: The vessel was normal in size. The respirophasic diameter changes were in the normal range (>= 50%), consistent with normal central venous pressure.  ------------------------------------------------------------------- Post procedure conclusions Ascending Aorta:  - The aorta was normal, not dilated, and non-diseased.  ------------------------------------------------------------------- Measurements  Left ventricle              Value     Reference LV ID, ED, PLAX chordal      (L)   36   mm    43 - 52 LV ID, ES, PLAX chordal          23.8 mm    23 - 38 LV fx shortening, PLAX chordal      34  %    >=29 LV PW thickness, ED            12.7 mm    --------- IVS/LV PW ratio, ED            0.96      <=1.3 Stroke volume, 2D             51  ml    --------- Stroke volume/bsa, 2D           21  ml/m^2  --------- LV e&', lateral              6.45 cm/s   --------- LV E/e&', lateral             24.19     --------- LV e&', medial               6.83 cm/s   --------- LV E/e&', medial              22.84     --------- LV  e&', average              6.64 cm/s   --------- LV E/e&', average             23.49     ---------  Ventricular septum            Value     Reference IVS thickness, ED             12.2 mm    ---------  LVOT                   Value     Reference LVOT ID, S                16  mm    --------- LVOT area                 2.01 cm^2   --------- LVOT peak velocity, S           133  cm/s   --------- LVOT mean velocity, S           82.4 cm/s   --------- LVOT VTI, S                25.5 cm    --------- LVOT peak gradient, S           7   mm Hg  ---------  Aortic valve               Value     Reference Aortic valve peak velocity, S       386  cm/s   --------- Aortic valve mean velocity, S       264  cm/s   --------- Aortic valve VTI, S            80.6 cm    --------- Aortic mean gradient, S          32  mm Hg  --------- Aortic peak gradient, S          60  mm Hg  --------- VTI ratio, LVOT/AV            0.32       --------- Aortic valve area, VTI          0.64 cm^2   --------- Aortic valve area/bsa, VTI        0.27 cm^2/m^2 --------- Velocity ratio, peak, LVOT/AV       0.34      --------- Aortic valve area, peak velocity     0.69 cm^2   --------- Aortic valve area/bsa, peak        0.29 cm^2/m^2 --------- velocity Velocity ratio, mean, LVOT/AV       0.31      --------- Aortic valve area, mean velocity     0.63 cm^2   --------- Aortic valve area/bsa, mean        0.26 cm^2/m^2 --------- velocity  Aorta                   Value     Reference Aortic root ID, ED            30  mm    ---------  Left atrium                Value     Reference LA ID, A-P, ES              51  mm    --------- LA ID/bsa,  A-P              2.12 cm/m^2  <=2.2 LA volume, ES, 1-p A4C          62.7 ml    --------- LA volume/bsa, ES, 1-p A4C        26.1 ml/m^2  --------- LA volume, ES, 1-p A2C          106  ml    --------- LA volume/bsa, ES, 1-p A2C        44.1 ml/m^2  ---------  Mitral valve               Value     Reference Mitral E-wave peak velocity        156  cm/s   --------- Mitral A-wave peak velocity        112  cm/s   --------- Mitral deceleration time     (H)   278  ms    150 - 230 Mitral peak gradient, D          10  mm Hg  --------- Mitral E/A ratio, peak          1.4      ---------  Right ventricle              Value     Reference TAPSE                   19.8 mm    --------- RV s&', lateral, S             10.1 cm/s   ---------  Legend: (L) and (H) mark values outside specified reference  range.  ------------------------------------------------------------------- Prepared and Electronically Authenticated by  Donato Schultz, M.D. 2016-06-18T15:37:55     CARDIAC CATHETERIZATION  Procedures    Right/Left Heart Cath and Coronary Angiography    PACS Images    Show images for Cardiac catheterization     Link to Procedure Log    Procedure Log      Indications    Aortic stenosis [I35.0 (ICD-10-CM)]    Technique and Indications    INDICATION: Severe symptomatic aortic stenosis  PROCEDURAL DETAILS: There was an indwelling IV in a right antecubital vein. Using normal sterile technique, the IV was changed out for a 5 Fr brachial sheath over a 0.018 inch wire. The right wrist was then prepped, draped, and anesthetized with 1% lidocaine. Using the modified Seldinger technique a 5/6 French Slender sheath was placed in the right radial artery. Intra-arterial verapamil was administered through the radial artery sheath. IV heparin was administered after a JR4 catheter was advanced into the central aorta. A Swan-Ganz catheter was used for the right heart catheterization. Standard protocol was followed for recording of right heart pressures and sampling of oxygen saturations. Fick cardiac output was calculated. Standard Judkins catheters were used for selective coronary angiography. An AL-2 catheter was used to direct a straight tip wire across the aortic valve. There were no immediate procedural complications. The patient was transferred to the post catheterization recovery area for further monitoring.  ESTIMATED BLOOD LOSS: Minimal   There were no immediate complications during the procedure.    Conclusion     Mid RCA lesion, 60% stenosed.  Ost RCA lesion, 40% stenosed.  Ost LM to LM lesion, 30% stenosed.  Ost Cx lesion, 50% stenosed.  Prox LAD to Mid LAD lesion, 40% stenosed.  Ost 1st Mrg to 1st Mrg lesion, 20% stenosed. The lesion was  previously treated with  a stent (unknown type) greater than two years ago.  1. Moderate stenosis of the right coronary artery 2. Heavy calcification with mild nonobstructive stenosis of the LAD 3. Mild to moderate ostial left circumflex stenosis with continued patency of the stented segment in the first OM 4. Calcified aortic valve with hemodynamic findings consistent with severe aortic stenosis  Recommend cardiac surgical evaluation for multidisciplinary approach to this patient with multiple comorbid medical conditions and severe symptomatic aortic stenosis.     Coronary Findings    Dominance: Right   Left Main   . Ost LM to LM lesion, 30% stenosed. calcified .     Left Anterior Descending   . Prox LAD to Mid LAD lesion, 40% stenosed. calcified diffuse . The proximal LAD is heavily calcified. There is diffuse irregularity through the proximal and mid LAD.     Left Circumflex   . Ost Cx lesion, 50% stenosed. calcified .   Marland Kitchen First Obtuse Marginal Branch   . Ost 1st Mrg to 1st Mrg lesion, 20% stenosed. The lesion was previously treated with a stent (unknown type) greater than two years ago.     Right Coronary Artery   . Ost RCA lesion, 40% stenosed. 40% ostial RCA lesion noted   . Mid RCA lesion, 60% stenosed. diffuse . There is diffuse 60-70% stenosis through the mid RCA with associated calcification       Right Heart Pressures Hemodynamic findings consistent with aortic stenosis. Elevated LV EDP consistent with volume overload.    Left Heart    Aortic Valve There is severe aortic valve stenosis. The aortic valve is calcified. There is restricted aortic valve motion. The mean transaortic valve gradient is 38 mmHg, calculated aortic valve area 0.78 cm, aortic valve area index 0.35    Coronary Diagrams    Diagnostic Diagram            Implants    Name ID Temporary Type Supply   No information  to display    Hemo Data       Most Recent Value   Fick Cardiac Output  4.7 L/min   Fick Cardiac Output Index  2.12 (L/min)/BSA   Aortic Mean Gradient  37.9 mmHg   Aortic Peak Gradient  36 mmHg   Aortic Valve Area  0.83   Aortic Value Area Index  0.37 cm2/BSA   RA A Wave  16 mmHg   RA V Wave  12 mmHg   RA Mean  10 mmHg   RV Systolic Pressure  54 mmHg   RV Diastolic Pressure  3 mmHg   RV EDP  12 mmHg   PA Systolic Pressure  51 mmHg   PA Diastolic Pressure  17 mmHg   PA Mean  31 mmHg   PW A Wave  22 mmHg   PW V Wave  19 mmHg   PW Mean  15 mmHg   AO Systolic Pressure  123 mmHg   AO Diastolic Pressure  50 mmHg   AO Mean  78 mmHg   LV Systolic Pressure  178 mmHg   LV Diastolic Pressure  5 mmHg   LV EDP  22 mmHg   Arterial Occlusion Pressure Extended Systolic Pressure  144 mmHg   Arterial Occlusion Pressure Extended Diastolic Pressure  50 mmHg   Arterial Occlusion Pressure Extended Mean Pressure  85 mmHg   Left Ventricular Apex Extended Systolic Pressure  180 mmHg   Left Ventricular Apex Extended Diastolic Pressure  11 mmHg   Left  Ventricular Apex Extended EDP Pressure  27 mmHg   QP/QS  0.94   TPVR Index  15.54 HRUI   TSVR Index  36.85 HRUI   PVR SVR Ratio  0.25   TPVR/TSVR Ratio  0.42    Cardiac TAVR CT  TECHNIQUE: The patient was scanned on a Philips 256 scanner. A 120 kV retrospective scan was triggered in the descending thoracic aorta at 111 HU's. Gantry rotation speed was 270 msecs and collimation was .9 mm. No beta blockade or nitro were given. The 3D data set was reconstructed in 5% intervals of the R-R cycle. Systolic and diastolic phases were analyzed on a dedicated work station using MPR, MIP and VRT modes. The patient received 80 cc of contrast.  FINDINGS: Aortic Valve:  Trileaflet and heavily calcified  Aorta: Normal arch vessel take off no coarctation No contraindications to stented valve delivery  Sinotubular Junction: 27 mm  Ascending Thoracic Aorta: 32 mm  Sinus of Valsalva Measurements:  Non-coronary: 29.2  Right coronary: 28  Left coronary: 29.5  Coronary Artery Height above Annulus:  Left Main: 13 mm  Right Coronary: 13.5 mm  Virtual Basal Annulus Measurements:  Maximum/Minimum Diameter: 25.8 x 20.5  Area: 416 mm2  Coronary Arteries: Adequate height above annulus for deployment  Optimum Fluoroscopic Angle for Delivery: LAO 10 degrees Cranial 1 degree  IMPRESSION: 1) Calcified trileaflet aortic valve suitable for under inflated 26 mm Sapien 3 valve  2) Suitable coronary artery height for deployment  3) Nodular annular calcification at base of non coronary cusp and dense subannular calcification along the anterior mitral leaflet may increase risk of para valvular regurgitation  4) Optimum angiographic angle for deployment LAO 10 degrees Cranial 1 degree  Charlton Haws   Electronically Signed  By: Charlton Haws M.D.  On: 03/24/2015 12:18    STS Risk Calculator  ProcedureAVR + CABG  Risk of Mortality8.7% Morbidity or Mortality45.1% Prolonged LOS26.0% Short LOS10.4% Permanent Stroke6.0% Prolonged Vent Support31.1% DSW Infection1.2% Renal Failure18.6% Reoperation15.1%         CLINICAL DATA: 81 year old male with history of severe aortic stenosis. Preprocedural study prior to potential transcatheter aortic valve replacement (TAVR)  procedure.  EXAM: CT ANGIOGRAPHY CHEST, ABDOMEN AND PELVIS  TECHNIQUE: Multidetector CT imaging through the chest, abdomen and pelvis was performed using the standard protocol during bolus administration of intravenous contrast. Multiplanar reconstructed images and MIPs were obtained and reviewed to evaluate the vascular anatomy.  CONTRAST: 80mL OMNIPAQUE IOHEXOL 350 MG/ML SOLN  COMPARISON: CT of the abdomen and pelvis 01/09/2015. Chest CT 05/28/2006.  FINDINGS: CTA CHEST FINDINGS  Mediastinum/Lymph Nodes: Heart size is borderline enlarged. There is no significant pericardial fluid, thickening or pericardial calcification. There is atherosclerosis of the thoracic aorta, the great vessels of the mediastinum and the coronary arteries, including calcified atherosclerotic plaque in the left main, left anterior descending, left circumflex and right coronary arteries. Severe thickening and calcifications of the aortic valve and the mitral-aortic intervalvular fibrosa. Multiple prominent borderline enlarged and mildly enlarged mediastinal and bilateral hilar lymph nodes measuring up to 12 mm in short axis in the subcarinal nodal station. Esophagus is unremarkable in appearance. No axillary lymphadenopathy.  Lungs/Pleura: No acute consolidative airspace disease. No pleural effusions. Mild linear scarring in the right upper lobe and to a lesser extent in the inferior segment of the lingula. Small calcified granuloma in the periphery of the left lower lobe. No other larger more suspicious appearing pulmonary nodules or masses are otherwise noted.  Musculoskeletal/Soft Tissues: There are no  aggressive appearing lytic or blastic lesions noted in the visualized portions of the skeleton.  CTA ABDOMEN AND PELVIS FINDINGS  Hepatobiliary: No discrete cystic or solid hepatic lesions. No intra or extrahepatic biliary ductal dilatation. Gallbladder is normal  in appearance.  Pancreas: No pancreatic mass. No pancreatic ductal dilatation. No pancreatic or peripancreatic fluid or inflammatory changes.  Spleen: Unremarkable.  Adrenals/Urinary Tract: Normal bilateral adrenal glands. Sub cm low-attenuation lesions in the kidneys bilaterally too small to definitively characterize, but are statistically likely tiny cysts. No hydroureteronephrosis. Urinary bladder is normal in appearance.  Stomach/Bowel: The appearance of the stomach is normal. No pathologic dilatation of small bowel or colon. Normal appendix.  Vascular/Lymphatic: Vascular findings and measurements pertinent to potential TAVR procedure, as detailed below. No aneurysm or dissection identified in the abdominal or pelvic vasculature. Ulcerated plaque in the infrarenal abdominal aorta at the level of the IMA origin (image 2 of 2 of series 401). Enlarged right external iliac lymph nodes measuring up to 14 mm in short axis. Left external iliac lymph nodes measuring up to 12 mm in short axis. No other lymphadenopathy noted elsewhere in the abdomen or pelvis.  Reproductive: Prostate gland and seminal vesicles are unremarkable in appearance.  Other: Right inguinal hernia containing only fat. No significant volume of ascites. No pneumoperitoneum.  Musculoskeletal: Status post laminectomy at L3-L4-L5. There are no aggressive appearing lytic or blastic lesions noted in the visualized portions of the skeleton.  VASCULAR MEASUREMENTS PERTINENT TO TAVR:  AORTA:  Minimal Aortic Diameter - 12 x 10 mm  Severity of Aortic Calcification - severe  RIGHT PELVIS:  Right Common Iliac Artery -  Minimal Diameter - 9.9 x 7.5 mm  Tortuosity - mild  Calcification - mild to moderate  Right External Iliac Artery -  Minimal Diameter - 8.0 x 7.6 mm  Tortuosity - mild  Calcification - none  Right Common Femoral Artery -  Minimal Diameter - 6.2 x 7.1  mm  Tortuosity - mild  Calcification - mild  LEFT PELVIS:  Left Common Iliac Artery -  Minimal Diameter - 9.3 x 6.0 mm  Tortuosity - moderate  Calcification - moderate  Left External Iliac Artery -  Minimal Diameter - 8.1 x 7.1 mm  Tortuosity - mild  Calcification - minimal  Left Common Femoral Artery -  Minimal Diameter - 7.5 x 7.1 mm  Tortuosity - mild  Calcification - mild  Review of the MIP images confirms the above findings.  IMPRESSION: 1. Vascular findings and measurements pertinent to potential TAVR procedure, as detailed above. This patient does appear to have suitable pelvic arterial access bilaterally. 2. Severe thickening calcifications of the aortic valve, compatible with the patient's reported clinical history of aortic stenosis. Notably, there is also extensive calcification of the mitral-aortic intervalvular fibrosa which may have implications for attempted transcatheter aortic valve placement. 3. Multiple borderline enlarged mildly enlarged mediastinal and external artery lymph nodes. This is nonspecific. Lymph nodes in the pelvis are similar to recent prior examination 01/09/2015 and may simply be reactive. Attention on any future followup examinations is recommended to ensure stability or resolution. 4. Additional incidental findings, as above.   Electronically Signed  By: Trudie Reed M.D.  On: 04/01/2015 10:37   Impression:  He has stage D severe symptomatic aortic stenosis with NYHA class III-IV symptoms of dyspnea and fatigue with minimal activity. I agree that replacement of his aortic valve is indicated. I think he would be at high risk for open surgical AVR and CABG  due to his age and comorbid factors. I think TAVR would be the best treatment option for him. I have personally reviewed the patient's most recent transthoracic echocardiogram, cardiac catheterization, and CT studies.  The patient has severe  thickening, calcification, and restricted leaflet mobility involving all 3 leaflets of the aortic valve. Peak velocity across the aortic valve approaches 4 m/s and mean transvalvular gradient was measured 38 mmHg by catheterization, corresponding to the aortic valve area calculated 0.78 cm. He has moderate coronary disease that is probably not causing significant ischemia at this time. He is not having any angina. His cardiac CT shows that he would be a candidate for TAVR. His annulus was measured at 416 mm which puts him at the upper end of a 23 mm Sapien 3 valve or the lower end of a 26 mm valve. This will have to be rechecked and a decision made about the best valve size for him. He does have some calcification of the intervalvular fibrosa and some nodular annular calcium at the base of the non-coronary cusp that may increase the risk of paravalvular leak. His pelvic vasculature appears adequate for a transfemoral approach.  He has been seen by dentistry and no active infection was seen. He will require extraction of his remaining teeth later. I discussed the operative procedure of TAVR with him and answered all of his questions. Following the decision to proceed with transcatheter aortic valve replacement, a discussion has been held regarding what types of management strategies would be attempted intraoperatively in the event of life-threatening complications, including whether or not the patient would be considered a candidate for the use of cardiopulmonary bypass and/or conversion to open sternotomy for attempted surgical intervention.  The patient has been advised of a variety of complications that might develop including but not limited to risks of death, stroke, paravalvular leak, aortic dissection or other major vascular complications, aortic annulus rupture, device embolization, cardiac rupture or perforation, mitral regurgitation, acute myocardial infarction, arrhythmia, heart block or bradycardia  requiring permanent pacemaker placement, congestive heart failure, respiratory failure, renal failure, pneumonia, infection, other late complications related to structural valve deterioration or migration, or other complications that might ultimately cause a temporary or permanent loss of functional independence or other long term morbidity.  The patient provides full informed consent for the procedure as described and all questions were answered.    Plan:  He will be scheduled for transfemoral TAVR on 04/06/2015. He has discontinued his Plavix. He will arrive at 6:30 am so that we can repeat his creatinine since it increased to 1.68 today from 1.37 on 9/3.    Alleen Borne, MD 04/02/2015

## 2015-04-02 NOTE — Pre-Procedure Instructions (Signed)
    [name]  04/02/2015      [facility] - [place] - [address] [place] [address] [address] [place] [address] Phone: [phone] Fax: [phone]    Your procedure is scheduled on Spetember 13, 2016.  Report to [facility] Admitting at 8 A.M.  Call this number if you have problems the morning of surgery:  [phone]   Remember:  Do not eat food or drink liquids after midnight.  Take these medicines the morning of surgery with A SIP OF WATER : allopurinol (ZYLOPRIM), amLODipine (NORVASC), buPROPion (WELLBUTRIN XL) , metoprolol  succinate (TOPROL-XL), pantoprazole (PROTONIX), tamsulosin (FLOMAX), if needed:acetaminophen (TYLENOL), LORazepam (ATIVAN)   STOP NSAIDS (ADVIL, ALEVE, IBUPROFEN), HERBAL MEDICATIONS (ONE WEEK PRIOR TO SURGERY)   PLAVIX AND ASPIRIN AS DIRECTED   Do not wear jewelry.  Do not wear lotions, powders, or colognes.  You may NOT wear deodorant.  Men may shave face and neck.  Do not bring valuables to the hospital.  [facility] is not responsible for any belongings or valuables.  Contacts, dentures or bridgework may not be worn into surgery.  Leave your suitcase in the car.  After surgery it may be brought to your room.  For patients admitted to the hospital, discharge time will be determined by your treatment team.  Patients discharged the day of surgery will not be allowed to drive home.   Name and phone number of your driver:    Special instructions:  "preparing for surgery"  Please read over the following fact sheets that you were given. Pain Booklet, Coughing and Deep Breathing, Blood Transfusion Information and Surgical Site Infection Prevention

## 2015-04-02 NOTE — Telephone Encounter (Signed)
Called, spoke with pt.  Discussed below results and recs per Dr. Sood.  Pt verbalized understanding and voiced no further questions or concerns at this time. 

## 2015-04-03 LAB — HEMOGLOBIN A1C
Hgb A1c MFr Bld: 6 % — ABNORMAL HIGH (ref 4.8–5.6)
MEAN PLASMA GLUCOSE: 126 mg/dL

## 2015-04-05 ENCOUNTER — Other Ambulatory Visit: Payer: Self-pay | Admitting: *Deleted

## 2015-04-05 DIAGNOSIS — I350 Nonrheumatic aortic (valve) stenosis: Secondary | ICD-10-CM

## 2015-04-05 MED ORDER — NITROGLYCERIN IN D5W 200-5 MCG/ML-% IV SOLN
2.0000 ug/min | INTRAVENOUS | Status: DC
  Filled 2015-04-05: qty 250

## 2015-04-05 MED ORDER — EPINEPHRINE HCL 1 MG/ML IJ SOLN
0.0000 ug/min | INTRAVENOUS | Status: DC
  Filled 2015-04-05: qty 4

## 2015-04-05 MED ORDER — DEXTROSE 5 % IV SOLN
0.0000 ug/min | INTRAVENOUS | Status: DC
  Filled 2015-04-05: qty 4

## 2015-04-05 MED ORDER — DEXMEDETOMIDINE HCL IN NACL 400 MCG/100ML IV SOLN
0.1000 ug/kg/h | INTRAVENOUS | Status: DC
  Filled 2015-04-05: qty 100

## 2015-04-05 MED ORDER — DEXTROSE 5 % IV SOLN
1.5000 g | INTRAVENOUS | Status: AC
  Administered 2015-04-06: 1.5 g via INTRAVENOUS
  Filled 2015-04-05: qty 1.5

## 2015-04-05 MED ORDER — SODIUM CHLORIDE 0.9 % IV SOLN
INTRAVENOUS | Status: DC
  Filled 2015-04-05: qty 2.5

## 2015-04-05 MED ORDER — DOPAMINE-DEXTROSE 3.2-5 MG/ML-% IV SOLN
0.0000 ug/kg/min | INTRAVENOUS | Status: DC
  Filled 2015-04-05: qty 250

## 2015-04-05 MED ORDER — MAGNESIUM SULFATE 50 % IJ SOLN
40.0000 meq | INTRAMUSCULAR | Status: DC
  Filled 2015-04-05: qty 10

## 2015-04-05 MED ORDER — POTASSIUM CHLORIDE 2 MEQ/ML IV SOLN
80.0000 meq | INTRAVENOUS | Status: DC
  Filled 2015-04-05: qty 40

## 2015-04-05 MED ORDER — SODIUM CHLORIDE 0.9 % IV SOLN
INTRAVENOUS | Status: DC
  Filled 2015-04-05: qty 30

## 2015-04-05 MED ORDER — PHENYLEPHRINE HCL 10 MG/ML IJ SOLN
30.0000 ug/min | INTRAVENOUS | Status: DC
  Filled 2015-04-05: qty 2

## 2015-04-05 MED ORDER — SODIUM CHLORIDE 0.9 % IV SOLN
1250.0000 mg | INTRAVENOUS | Status: AC
  Administered 2015-04-06: 1250 mg via INTRAVENOUS
  Filled 2015-04-05: qty 1250

## 2015-04-05 NOTE — H&P (Signed)
[address]       [place] [address]             [phone]          CARDIOTHORACIC SURGERY HISTORY AND PHYSICAL EXAM  Referring Provider is Othella Boyer, MD PCP is [name], MD  Chief Complaint  Patient presents with  . Shortness of Breath    ECHO, 01/09/15, CATH 03/15/15, PFT and CT'S scheduled for 03/31/15 ans visit with Dr. Laneta Simmers on 04/07/15  . Congestive Heart Failure    chronic  . Aortic Stenosis    severe    HPI:  Patient is an 81 year old male with history of aortic stenosis, coronary artery disease, hypertension, type 2 diabetes mellitus, obesity, stage III chronic kidney disease, and obstructive sleep apnea who has been referred for surgical consultation to discuss treatment options for management of severe aortic stenosis. The patient has been followed by Dr. Donnie Aho for many years. He has suffered 2 previous myocardial infarctions in the remote past and treated with PCI and stenting of the first obtuse marginal branch of the left circumflex coronary artery, most recently in 2010. He has developed aortic stenosis which has gradually progressed on follow-up echocardiograms. The patient states that he has done well until the past year. He has been hospitalized a total of 3 occasions with acute exacerbation of chronic diastolic congestive heart failure beginning in November 2015. He had a prolonged hospitalization from 01/09/2015 to 01/28/2015 with another acute exacerbation of chronic diastolic congestive heart failure complicated by hypoxemic respiratory failure requiring a fairly prolonged course of mechanical ventilation. Transthoracic echocardiogram performed at that time demonstrated the presence of moderate to severe aortic stenosis with peak velocity across the aortic valve measured 3.9 m/s corresponding to mean transvalvular gradient estimated at 32 mmHg. Left ventricular systolic function was preserved with ejection  fraction estimated at 55-60%. During that hospitalization he developed febrile illness associated with coag-negative Staphylococcus bacteremia for which he was treated with a 4 week course of intravenous antibiotics. The patient never had a transesophageal echocardiogram performed, although transthoracic echocardiograms did not reveal any findings to suggest the presence of bacterial endocarditis. Blood cultures cleared on anabiotic therapy and subsequent follow-up blood cultures performed after completion of treatment were negative. The patient gradually recovered from his prolonged hospitalization and spent 3 weeks at a skilled nursing facility in July. He eventually returned to independent living and was referred to Dr. Excell Seltzer who saw him in consultation on 03/12/2015 and scheduled the patient for diagnostic cardiac catheterization. Catheterization confirmed the presence of severe aortic stenosis with mean transvalvular gradient measured 37.9 mmHg corresponding to aortic valve area calculated 0.83 cm. The patient had moderate multivessel coronary artery disease with long segment 60-70% stenosis of the mid right coronary artery. There was moderate to severe calcification of the proximal left anterior descending coronary artery but no significant flow-limiting stenosis. There was 50% ostial stenosis of the left circumflex coronary artery with continued patency in the stented segment of the large first obtuse marginal branch. The patient was referred for elective surgical consultation, but prior to his visit in our office he was again hospitalized briefly last week with another acute exacerbation of chronic diastolic congestive heart failure. This time he was stabilized quickly and discharged home in less than 48 hours. He presents for elective surgical consultation to discuss treatment options for management of severe aortic stenosis.  The patient has been a widower for the past 5 years  and currently lives  alone in [place]. He has 5 grown children, and one of his sons lives right next door. The patient has been retired since 2000, having previously worked Investment banker, corporate for [employer]. He still enjoys woodworking and working in his shop. However, the patient states that he has not been able to do much of anything over the past year. He has been limited primarily by shortness of breath, and he remains extremely limited with frequent episodes of shortness of breath at rest and with minimal physical activity. He denies any history of chest pain or chest tightness. He cannot lay flat in bed and using his CPAP mask seems to make him feel worse. He has palpitations. He has had occasional dizzy spells without syncope. He has not had significant lower extremity edema.           Past Medical History  Diagnosis Date  . Gout, unspecified     severe dz, "treatment done that last 15 years"  . Obesity (BMI 30-39.9)   . Restless leg syndrome   . History of retinal detachment 1994  . Hypertensive heart disease   . CAD (coronary artery disease), native coronary artery   . History of pericarditis 2007    MSSA, s/p pericardial window  . GERD   . Osteoarthritis   . Sleep apnea in adult     CPAP qhs  . TIA (transient ischemic attack) 2005  . BPH (benign prostatic hypertrophy)     "minor"  . Lumbar spinal stenosis   . Carotid artery occlusion   . Unspecified venous (peripheral) insufficiency   . Stroke May 2003  . Anxiety   . Depression   . Aortic stenosis     ECHO 05/29/14 moderate AS Mean gradient 21 mm EF 55% ECHO 07/09/13  Mean gradient 12 mm Mild to moderate AS EF 50%    . CAD (coronary artery disease)     02/11/09  Xience stent to circumflex 2.5 x 18 mm postdilated to 2.75 mm  Cath sept 2011  normal Left main, 20 % stenosis proximal LAD, 40% stenosis mid LAD, 70% stenosis proximal Diag 1, 90% stenosis prox CFX, 90% first OM, small and nondominant RCA;   . Carotid artery disease     Prior TIA  2006 with left CEA by Dr. Arbie Cookey Recurrent TIA in April 2011   . Lumbar disc disease   . CKD (chronic kidney disease), stage III 02/08/2014  . CHF (congestive heart failure)   . Renal insufficiency   . Hypertension   . Type 2 diabetes mellitus with vascular disease     Takes no meds    Past Surgical History  Procedure Laterality Date  . Pericardial window  2007  . Left arm  2008    shoulder  . Right arm  1970's    shoulder  . Cataract extraction      Left side x's 2 and right  . Retinal detachment surgery      left side  . Carotid endarterectomy Left 12-06-05    cea  . Coronary angioplasty  01/2009    x 1 stent  . Colonoscopy    . Carpal tunnel release Bilateral   . Back surgery  2013    removed bone spurs  . Inguinal hernia repair Left 10/01/2013    Procedure: HERNIA REPAIR INGUINAL ADULT;  Surgeon: Axel Filler, MD;  Location: [facility];  Service: General;  Laterality: Left;  . Insertion of mesh Left 10/01/2013  Procedure: INSERTION OF MESH;  Surgeon: Axel Filler, MD;  Location: [facility];  Service: General;  Laterality: Left;  . Finger amputation  May 2015    JUST TO FIRST JOINT RIGHT HAND  LAST FINGER ( Right 5th finger)  . Peripheral vascular catheterization N/A 01/08/2015    Procedure: Abdominal Aortogram;  Surgeon: Chuck Hint, MD;  Location: [facility] INVASIVE CV LAB;  Service: Cardiovascular;  Laterality: N/A;  . Cardiac catheterization N/A 03/15/2015    Procedure: Right/Left Heart Cath and Coronary Angiography;  Surgeon: Tonny Bollman, MD;  Location: [facility] INVASIVE CV LAB;  Service: Cardiovascular;  Laterality: N/A;    Family History  Problem Relation Age of Onset  . Heart disease Mother     Before age 50  . Heart attack Mother   . Diabetes Father   . Heart disease Father   . Breast cancer Sister   . Cancer Sister   . Prostate cancer Brother   . Lung cancer Brother   . Cancer Brother   . Diabetes Daughter   . Diabetes Son   . Hypertension Son     Social  History Social History  Substance Use Topics  . Smoking status: Never Smoker   . Smokeless tobacco: Never Used  . Alcohol Use: No    Prior to Admission medications   Medication Sig Start Date End Date Taking? Authorizing Provider  acetaminophen (TYLENOL) 325 MG tablet Take 2 tablets (650 mg total) by mouth every 6 (six) hours as needed for mild pain or moderate pain. Patient taking differently: Take 325 mg by mouth every 6 (six) hours as needed for mild pain or moderate pain.  07/21/14  Yes Elease Etienne, MD  allopurinol (ZYLOPRIM) 300 MG tablet Take 1 tablet (300 mg total) by mouth every morning. 10/06/14  Yes Newt Lukes, MD  amLODipine (NORVASC) 5 MG tablet Take 5 mg by mouth daily as needed (Pt states he only takes if Heart Rate is high).   Yes Historical Provider, MD  aspirin EC 81 MG tablet Take 81 mg by mouth daily.   Yes Historical Provider, MD  buPROPion (WELLBUTRIN XL) 150 MG 24 hr tablet Take 1 tablet (150 mg total) by mouth every morning. 10/06/14  Yes Newt Lukes, MD  cholecalciferol (VITAMIN D) 1000 UNITS tablet Take 1,000 Units by mouth daily.   Yes Historical Provider, MD  clopidogrel (PLAVIX) 75 MG tablet Take 1 tablet (75 mg total) by mouth daily. Patient not taking: Reported on 04/01/2015 11/11/14  Yes Newt Lukes, MD  docusate sodium 100 MG CAPS Take 100 mg by mouth 2 (two) times daily. 07/16/14  Yes Ripudeep Jenna Luo, MD  Ferrous Gluconate 256 (28 FE) MG TABS Take 256 mg by mouth daily.   Yes Historical Provider, MD  furosemide (LASIX) 40 MG tablet Take 1 tablet (40 mg total) by mouth daily. 07/21/14  Yes Elease Etienne, MD  LORazepam (ATIVAN) 1 MG tablet TAKE ONE TABLET BY MOUTH EVERY 8 HOURS AS NEEDED FOR ANXIETY/SLEEP 03/23/15  Yes Etta Grandchild, MD  Magnesium 250 MG TABS Take 250 mg by mouth daily.    Yes Historical Provider, MD  metoprolol succinate (TOPROL-XL) 50 MG 24 hr tablet Take 1 tablet (50 mg total) by mouth daily. Take with or immediately  following a meal. 11/11/14  Yes Newt Lukes, MD  mirabegron ER (MYRBETRIQ) 50 MG TB24 tablet Take 1 tablet (50 mg total) by mouth daily. 10/06/14  Yes Newt Lukes, MD  mirtazapine (REMERON) 15 MG tablet Take 1 tablet (15 mg total) by mouth at bedtime. 09/10/14  Yes Newt Lukes, MD  pantoprazole (PROTONIX) 40 MG tablet Take 1 tablet (40 mg total) by mouth 2 (two) times daily. 10/06/14  Yes Newt Lukes, MD  pregabalin (LYRICA) 200 MG capsule Take 1 capsule (200 mg total) by mouth at bedtime. 12/15/14  Yes Newt Lukes, MD  rOPINIRole (REQUIP) 4 MG tablet Take 1 tablet (4 mg total) by mouth at bedtime. 10/06/14  Yes Newt Lukes, MD  tamsulosin (FLOMAX) 0.4 MG CAPS capsule Take 1 capsule (0.4 mg total) by mouth daily. 11/11/14  Yes Newt Lukes, MD    Allergies  Allergen Reactions  . Statins Other (See Comments)    Severe leg myalgias, weakness     Review of Systems:  General:normal appetite, decreased energy, no weight gain, no weight loss, no fever Cardiac:no chest pain with exertion, no chest pain at rest, +SOB with minimal exertion, + intermittent resting SOB, + PND, + orthopnea, + palpitations, no arrhythmia, no atrial fibrillation, no LE edema, + dizzy spells, no syncope Respiratory:+ shortness of breath, no home oxygen, no productive cough, no dry cough, no bronchitis, no wheezing, no hemoptysis, no asthma, no pain with inspiration or cough, + sleep apnea, + CPAP at night GI:+ occasional difficulty swallowing, no reflux, no frequent heartburn, no hiatal hernia, no abdominal pain, no constipation, no diarrhea, no hematochezia, no hematemesis, no melena GU:no dysuria, + frequency, no urinary tract infection, no hematuria, + enlarged prostate, no kidney stones, + kidney  disease Vascular:no pain suggestive of claudication, no pain in feet, no leg cramps, no varicose veins, no DVT, no non-healing foot ulcer Neuro:+ stroke, + TIA's, no seizures, no headaches, no temporary blindness one eye, no slurred speech, no peripheral neuropathy, no chronic pain, no instability of gait, no memory/cognitive dysfunction Musculoskeletal:+ arthritis particularly in hips, no joint swelling, no myalgias, no difficulty walking, normal mobility  Skin:no rash, no itching, no skin infections, no pressure sores or ulcerations Psych:no anxiety, no depression, no nervousness, no unusual recent stress Eyes:no blurry vision, no floaters, no recent vision changes, wears glasses for reading ENT:no hearing loss, poor dentition but no loose or painful teeth, no dentures, last saw dentist more than 5 years ago Hematologic:+ easy bruising, no abnormal bleeding, no clotting disorder, no frequent epistaxis Endocrine:+ diabetes, does not check CBG's at home       Physical Exam:  BP 137/62 mmHg  Pulse 68  Resp 20  Ht 5\' 10"  (1.778 m)  Wt 230 lb (104.327 kg)  BMI 33.00 kg/m2  SpO2 94% General:Chronically ill-appearing HEENT:Unremarkable  Neck:no JVD, no bruits, no adenopathy  Chest:clear to auscultation, symmetrical breath sounds, no wheezes, no rhonchi  CV:RRR, grade IV/VI crescendo/decrescendo murmur heard best at RUSB, no diastolic  murmur Abdomen:soft, non-tender, no masses  Extremities:warm, well-perfused, pulses not palpable, no LE edema Rectal/GUDeferred Neuro:Grossly non-focal and symmetrical throughout Skin:Clean and dry, no rashes, no breakdown      Diagnostic Tests:  Transthoracic Echocardiography  Patient:  Hamad, Whyte MR #:    [id number] Study Date: 01/09/2015 Gender:   M Age:    41 Height:   175.3 cm Weight:   114.3 kg BSA:    2.41 m^2 Pt. Status: Room:    3E15C  ATTENDING  Marisa Severin [id number] ADMITTING  Victorio Palm REFERRING  Hillary Bow SONOGRAPHER Arvil Chaco PERFORMING  [facility], Inpatient  cc:  -------------------------------------------------------------------  LV EF: 55% -  60%  ------------------------------------------------------------------- Indications:   Murmur 785.2.  ------------------------------------------------------------------- Study Conclusions  - Left ventricle: The cavity size was normal. There was mild concentric hypertrophy. Systolic function was normal. The estimated ejection fraction was in the range of 55% to 60%. Although no diagnostic regional wall motion abnormality was identified, this possibility cannot be completely excluded on the basis of this study. Features are consistent with a pseudonormal left ventricular filling pattern, with concomitant abnormal relaxation and increased filling pressure (grade 2 diastolic dysfunction). - Aortic valve: Cusp separation was reduced. There was moderate stenosis. Peak velocity (S): 386 cm/s. Mean gradient (S): 32 mm Hg. - Mitral valve: Moderately calcified annulus. - Left atrium: The atrium was mildly to moderately  dilated.  Impressions:  - When compared to prior echocardiogram, aortic stenosis has advanced.  Transthoracic echocardiography. M-mode, complete 2D, spectral Doppler, and color Doppler. Birthdate: Patient birthdate: 23-Dec-1932. Age: Patient is 81 yr old. Sex: Gender: male. BMI: 37.2 kg/m^2. Blood pressure:   121/48 Patient status: Inpatient. Study date: Study date: 01/09/2015. Study time: 01:50 PM. Location: Bedside.  -------------------------------------------------------------------  ------------------------------------------------------------------- Left ventricle: The cavity size was normal. There was mild concentric hypertrophy. Systolic function was normal. The estimated ejection fraction was in the range of 55% to 60%. Although no diagnostic regional wall motion abnormality was identified, this possibility cannot be completely excluded on the basis of this study. Features are consistent with a pseudonormal left ventricular filling pattern, with concomitant abnormal relaxation and increased filling pressure (grade 2 diastolic dysfunction).  ------------------------------------------------------------------- Aortic valve:  Moderately thickened, moderately calcified leaflets. Cusp separation was reduced. Doppler:  There was moderate stenosis.  There was no regurgitation.  VTI ratio of LVOT to aortic valve: 0.32. Valve area (VTI): 0.64 cm^2. Indexed valve area (VTI): 0.27 cm^2/m^2. Peak velocity ratio of LVOT to aortic valve: 0.34. Valve area (Vmax): 0.69 cm^2. Indexed valve area (Vmax): 0.29 cm^2/m^2. Mean velocity ratio of LVOT to aortic valve: 0.31. Valve area (Vmean): 0.63 cm^2. Indexed valve area (Vmean): 0.26 cm^2/m^2.  Mean gradient (S): 32 mm Hg. Peak gradient (S): 60 mm Hg.  ------------------------------------------------------------------- Aorta: The aorta was normal, not dilated, and  non-diseased.  ------------------------------------------------------------------- Mitral valve:  Moderately calcified annulus. Leaflet separation was normal. Doppler: Transvalvular velocity was within the normal range. There was no evidence for stenosis. There was no regurgitation.  Peak gradient (D): 10 mm Hg.  ------------------------------------------------------------------- Left atrium: The atrium was mildly to moderately dilated.  ------------------------------------------------------------------- Right ventricle: The cavity size was normal. Wall thickness was normal. Systolic function was normal.  ------------------------------------------------------------------- Pulmonic valve:  Poorly visualized. Structurally normal valve. Cusp separation was normal. Doppler: Transvalvular velocity was within the normal range. There was trivial regurgitation.  ------------------------------------------------------------------- Tricuspid valve:  Structurally normal valve.  Leaflet separation was normal. Doppler: Transvalvular velocity was within the normal range. There was trivial regurgitation.  ------------------------------------------------------------------- Pulmonary artery:  The main pulmonary artery was normal-sized.  ------------------------------------------------------------------- Right atrium: The atrium was normal in size.  ------------------------------------------------------------------- Pericardium: The pericardium was normal in appearance. There was no pericardial effusion.  ------------------------------------------------------------------- Systemic veins: Inferior vena cava: The vessel was normal in size. The respirophasic diameter changes were in the normal range (>= 50%), consistent with normal central venous pressure.  ------------------------------------------------------------------- Post procedure conclusions Ascending Aorta:  - The  aorta was normal, not dilated, and non-diseased.  ------------------------------------------------------------------- Measurements  Left ventricle              Value     Reference LV ID, ED,  PLAX chordal      (L)   36  mm    43 - 52 LV ID, ES, PLAX chordal          23.8 mm    23 - 38 LV fx shortening, PLAX chordal      34  %    >=29 LV PW thickness, ED            12.7 mm    --------- IVS/LV PW ratio, ED            0.96      <=1.3 Stroke volume, 2D             51  ml    --------- Stroke volume/bsa, 2D           21  ml/m^2  --------- LV e&', lateral              6.45 cm/s   --------- LV E/e&', lateral             24.19     --------- LV e&', medial               6.83 cm/s   --------- LV E/e&', medial              22.84     --------- LV e&', average              6.64 cm/s   --------- LV E/e&', average             23.49     ---------  Ventricular septum            Value     Reference IVS thickness, ED             12.2 mm    ---------  LVOT                   Value     Reference LVOT ID, S                16  mm    --------- LVOT area                 2.01 cm^2   --------- LVOT peak velocity, S           133  cm/s   --------- LVOT mean velocity, S           82.4 cm/s   --------- LVOT VTI, S                25.5 cm    --------- LVOT peak gradient, S           7   mm Hg  ---------  Aortic valve               Value     Reference Aortic valve peak velocity, S       386  cm/s   --------- Aortic valve mean velocity, S       264  cm/s   --------- Aortic valve  VTI, S            80.6 cm    --------- Aortic mean gradient, S          32  mm Hg  --------- Aortic peak gradient, S          60  mm Hg  --------- VTI ratio, LVOT/AV            0.32      ---------  Aortic valve area, VTI          0.64 cm^2   --------- Aortic valve area/bsa, VTI        0.27 cm^2/m^2 --------- Velocity ratio, peak, LVOT/AV       0.34      --------- Aortic valve area, peak velocity     0.69 cm^2   --------- Aortic valve area/bsa, peak        0.29 cm^2/m^2 --------- velocity Velocity ratio, mean, LVOT/AV       0.31      --------- Aortic valve area, mean velocity     0.63 cm^2   --------- Aortic valve area/bsa, mean        0.26 cm^2/m^2 --------- velocity  Aorta                   Value     Reference Aortic root ID, ED            30  mm    ---------  Left atrium                Value     Reference LA ID, A-P, ES              51  mm    --------- LA ID/bsa, A-P              2.12 cm/m^2  <=2.2 LA volume, ES, 1-p A4C          62.7 ml    --------- LA volume/bsa, ES, 1-p A4C        26.1 ml/m^2  --------- LA volume, ES, 1-p A2C          106  ml    --------- LA volume/bsa, ES, 1-p A2C        44.1 ml/m^2  ---------  Mitral valve               Value     Reference Mitral E-wave peak velocity        156  cm/s   --------- Mitral A-wave peak velocity        112  cm/s   --------- Mitral deceleration time     (H)   278  ms    150 - 230 Mitral peak gradient, D          10  mm Hg  --------- Mitral E/A ratio, peak          1.4      ---------  Right ventricle              Value      Reference TAPSE                   19.8 mm    --------- RV s&', lateral, S             10.1 cm/s   ---------  Legend: (L) and (H) mark values outside specified reference range.  ------------------------------------------------------------------- Prepared and Electronically Authenticated by  Donato Schultz, M.D. 2016-06-18T15:37:55     CARDIAC CATHETERIZATION  Procedures    Right/Left Heart Cath and Coronary Angiography    PACS Images    Show images for Cardiac catheterization     Link to Procedure Log    Procedure Log      Indications    Aortic stenosis [I35.0 (ICD-10-CM)]    Technique and Indications    INDICATION: Severe symptomatic aortic stenosis  PROCEDURAL DETAILS: There was an indwelling IV in a  right antecubital vein. Using normal sterile technique, the IV was changed out for a 5 Fr brachial sheath over a 0.018 inch wire. The right wrist was then prepped, draped, and anesthetized with 1% lidocaine. Using the modified Seldinger technique a 5/6 French Slender sheath was placed in the right radial artery. Intra-arterial verapamil was administered through the radial artery sheath. IV heparin was administered after a JR4 catheter was advanced into the central aorta. A Swan-Ganz catheter was used for the right heart catheterization. Standard protocol was followed for recording of right heart pressures and sampling of oxygen saturations. Fick cardiac output was calculated. Standard Judkins catheters were used for selective coronary angiography. An AL-2 catheter was used to direct a straight tip wire across the aortic valve. There were no immediate procedural complications. The patient was transferred to the post catheterization recovery area for further monitoring.  ESTIMATED BLOOD LOSS: Minimal   There were no immediate complications during the procedure.    Conclusion     Mid RCA lesion, 60%  stenosed.  Ost RCA lesion, 40% stenosed.  Ost LM to LM lesion, 30% stenosed.  Ost Cx lesion, 50% stenosed.  Prox LAD to Mid LAD lesion, 40% stenosed.  Ost 1st Mrg to 1st Mrg lesion, 20% stenosed. The lesion was previously treated with a stent (unknown type) greater than two years ago.  1. Moderate stenosis of the right coronary artery 2. Heavy calcification with mild nonobstructive stenosis of the LAD 3. Mild to moderate ostial left circumflex stenosis with continued patency of the stented segment in the first OM 4. Calcified aortic valve with hemodynamic findings consistent with severe aortic stenosis  Recommend cardiac surgical evaluation for multidisciplinary approach to this patient with multiple comorbid medical conditions and severe symptomatic aortic stenosis.     Coronary Findings    Dominance: Right   Left Main   . Ost LM to LM lesion, 30% stenosed. calcified .     Left Anterior Descending   . Prox LAD to Mid LAD lesion, 40% stenosed. calcified diffuse . The proximal LAD is heavily calcified. There is diffuse irregularity through the proximal and mid LAD.     Left Circumflex   . Ost Cx lesion, 50% stenosed. calcified .   Marland Kitchen First Obtuse [place]   . Ost 1st Mrg to 1st Mrg lesion, 20% stenosed. The lesion was previously treated with a stent (unknown type) greater than two years ago.     Right Coronary Artery   . Ost RCA lesion, 40% stenosed. 40% ostial RCA lesion noted   . Mid RCA lesion, 60% stenosed. diffuse . There is diffuse 60-70% stenosis through the mid RCA with associated calcification       Right Heart Pressures Hemodynamic findings consistent with aortic stenosis. Elevated LV EDP consistent with volume overload.    Left Heart    Aortic Valve There is severe aortic valve stenosis. The aortic valve is calcified. There is restricted aortic valve motion. The mean transaortic valve gradient is 38  mmHg, calculated aortic valve area 0.78 cm, aortic valve area index 0.35    Coronary Diagrams    Diagnostic Diagram            Implants    Name ID Temporary Type Supply   No information to display    Hemo Data       Most Recent Value   Fick Cardiac Output  4.7 L/min   Fick Cardiac Output Index  2.12 (L/min)/BSA   Aortic Mean Gradient  37.9 mmHg   Aortic Peak Gradient  36 mmHg   Aortic Valve Area  0.83   Aortic Value Area Index  0.37 cm2/BSA   RA A Wave  16 mmHg   RA V Wave  12 mmHg   RA Mean  10 mmHg   RV Systolic Pressure  54 mmHg   RV Diastolic Pressure  3 mmHg   RV EDP  12 mmHg   PA Systolic Pressure  51 mmHg   PA Diastolic Pressure  17 mmHg   PA Mean  31 mmHg   PW A Wave  22 mmHg   PW V Wave  19 mmHg   PW Mean  15 mmHg   AO Systolic Pressure  123 mmHg   AO Diastolic Pressure  50 mmHg   AO Mean  78 mmHg   LV Systolic Pressure  178 mmHg   LV Diastolic Pressure  5 mmHg   LV EDP  22 mmHg   Arterial Occlusion Pressure Extended Systolic Pressure  144 mmHg   Arterial Occlusion Pressure Extended Diastolic Pressure  50 mmHg   Arterial Occlusion Pressure Extended Mean Pressure  85 mmHg   Left Ventricular Apex Extended Systolic Pressure  180 mmHg   Left Ventricular Apex Extended Diastolic Pressure  11 mmHg   Left Ventricular Apex Extended EDP Pressure  27 mmHg   QP/QS  0.94   TPVR Index  15.54 HRUI   TSVR Index  36.85 HRUI   PVR SVR Ratio  0.25   TPVR/TSVR Ratio  0.42    Cardiac TAVR CT  TECHNIQUE: The patient was scanned on a Philips 256 scanner. A 120 kV retrospective scan was triggered in the descending thoracic aorta at 111 HU's. Gantry rotation speed was 270 msecs and collimation was .9 mm. No beta blockade or nitro were given. The 3D data set  was reconstructed in 5% intervals of the R-R cycle. Systolic and diastolic phases were analyzed on a dedicated work station using MPR, MIP and VRT modes. The patient received 80 cc of contrast.  FINDINGS: Aortic Valve: Trileaflet and heavily calcified  Aorta: Normal arch vessel take off no coarctation No contraindications to stented valve delivery  Sinotubular Junction: 27 mm  Ascending Thoracic Aorta: 32 mm  Sinus of Valsalva Measurements:  Non-coronary: 29.2  Right coronary: 28  Left coronary: 29.5  Coronary Artery Height above Annulus:  Left Main: 13 mm  Right Coronary: 13.5 mm  Virtual Basal Annulus Measurements:  Maximum/Minimum Diameter: 25.8 x 20.5  Area: 416 mm2  Coronary Arteries: Adequate height above annulus for deployment  Optimum Fluoroscopic Angle for Delivery: LAO 10 degrees Cranial 1 degree  IMPRESSION: 1) Calcified trileaflet aortic valve suitable for under inflated 26 mm Sapien 3 valve  2) Suitable coronary artery height for deployment  3) Nodular annular calcification at base of non coronary cusp and dense subannular calcification along the anterior mitral leaflet may increase risk of para valvular regurgitation  4) Optimum angiographic angle for deployment LAO 10 degrees Cranial 1 degree  Charlton Haws   Electronically Signed  By: Charlton Haws M.D.  On: 03/24/2015 12:18  CT ANGIOGRAPHY CHEST, ABDOMEN AND PELVIS  TECHNIQUE: Multidetector CT imaging through the chest, abdomen and pelvis was performed using the standard protocol during bolus administration of intravenous contrast. Multiplanar reconstructed images and MIPs were obtained and reviewed to evaluate the vascular anatomy.  CONTRAST: 49mL OMNIPAQUE IOHEXOL 350 MG/ML SOLN  COMPARISON: CT of the abdomen and pelvis 01/09/2015. Chest CT 05/28/2006.  FINDINGS: CTA CHEST  FINDINGS  Mediastinum/Lymph Nodes: Heart size is borderline  enlarged. There is no significant pericardial fluid, thickening or pericardial calcification. There is atherosclerosis of the thoracic aorta, the great vessels of the mediastinum and the coronary arteries, including calcified atherosclerotic plaque in the left main, left anterior descending, left circumflex and right coronary arteries. Severe thickening and calcifications of the aortic valve and the mitral-aortic intervalvular fibrosa. Multiple prominent borderline enlarged and mildly enlarged mediastinal and bilateral hilar lymph nodes measuring up to 12 mm in short axis in the subcarinal nodal station. Esophagus is unremarkable in appearance. No axillary lymphadenopathy.  Lungs/Pleura: No acute consolidative airspace disease. No pleural effusions. Mild linear scarring in the right upper lobe and to a lesser extent in the inferior segment of the lingula. Small calcified granuloma in the periphery of the left lower lobe. No other larger more suspicious appearing pulmonary nodules or masses are otherwise noted.  Musculoskeletal/Soft Tissues: There are no aggressive appearing lytic or blastic lesions noted in the visualized portions of the skeleton.  CTA ABDOMEN AND PELVIS FINDINGS  Hepatobiliary: No discrete cystic or solid hepatic lesions. No intra or extrahepatic biliary ductal dilatation. Gallbladder is normal in appearance.  Pancreas: No pancreatic mass. No pancreatic ductal dilatation. No pancreatic or peripancreatic fluid or inflammatory changes.  Spleen: Unremarkable.  Adrenals/Urinary Tract: Normal bilateral adrenal glands. Sub cm low-attenuation lesions in the kidneys bilaterally too small to definitively characterize, but are statistically likely tiny cysts. No hydroureteronephrosis. Urinary bladder is normal in appearance.  Stomach/Bowel: The appearance of the stomach is normal. No pathologic dilatation of small bowel or colon. Normal  appendix.  Vascular/Lymphatic: Vascular findings and measurements pertinent to potential TAVR procedure, as detailed below. No aneurysm or dissection identified in the abdominal or pelvic vasculature. Ulcerated plaque in the infrarenal abdominal aorta at the level of the IMA origin (image 2 of 2 of series 401). Enlarged right external iliac lymph nodes measuring up to 14 mm in short axis. Left external iliac lymph nodes measuring up to 12 mm in short axis. No other lymphadenopathy noted elsewhere in the abdomen or pelvis.  Reproductive: Prostate gland and seminal vesicles are unremarkable in appearance.  Other: Right inguinal hernia containing only fat. No significant volume of ascites. No pneumoperitoneum.  Musculoskeletal: Status post laminectomy at L3-L4-L5. There are no aggressive appearing lytic or blastic lesions noted in the visualized portions of the skeleton.  VASCULAR MEASUREMENTS PERTINENT TO TAVR:  AORTA:  Minimal Aortic Diameter - 12 x 10 mm  Severity of Aortic Calcification - severe  RIGHT PELVIS:  Right Common Iliac Artery -  Minimal Diameter - 9.9 x 7.5 mm  Tortuosity - mild  Calcification - mild to moderate  Right External Iliac Artery -  Minimal Diameter - 8.0 x 7.6 mm  Tortuosity - mild  Calcification - none  Right Common Femoral Artery -  Minimal Diameter - 6.2 x 7.1 mm  Tortuosity - mild  Calcification - mild  LEFT PELVIS:  Left Common Iliac Artery -  Minimal Diameter - 9.3 x 6.0 mm  Tortuosity - moderate  Calcification - moderate  Left External Iliac Artery -  Minimal Diameter - 8.1 x 7.1 mm  Tortuosity - mild  Calcification - minimal  Left Common Femoral Artery -  Minimal Diameter - 7.5 x 7.1 mm  Tortuosity - mild  Calcification - mild  Review of the MIP images confirms the above findings.  IMPRESSION: 1. Vascular findings and measurements pertinent to potential  TAVR procedure, as detailed above. This  patient does appear to have suitable pelvic arterial access bilaterally. 2. Severe thickening calcifications of the aortic valve, compatible with the patient's reported clinical history of aortic stenosis. Notably, there is also extensive calcification of the mitral-aortic intervalvular fibrosa which may have implications for attempted transcatheter aortic valve placement. 3. Multiple borderline enlarged mildly enlarged mediastinal and external artery lymph nodes. This is nonspecific. Lymph nodes in the pelvis are similar to recent prior examination 01/09/2015 and may simply be reactive. Attention on any future followup examinations is recommended to ensure stability or resolution. 4. Additional incidental findings, as above.   Electronically Signed  By: Trudie Reed M.D.  On: 04/01/2015 10:37           Vitals     Height Weight BMI (Calculated)     (1.778 m) 104.327 kg (230 lb) 33.1      Interpretation Summary     CLINICAL DATA: 81 year old male with history of severe aortic stenosis. Preprocedural study prior to potential transcatheter aortic valve replacement (TAVR) procedure.  EXAM: CT ANGIOGRAPHY CHEST, ABDOMEN AND PELVIS  TECHNIQUE: Multidetector CT imaging through the chest, abdomen and pelvis was performed using the standard protocol during bolus administration of intravenous contrast. Multiplanar reconstructed images and MIPs were obtained and reviewed to evaluate the vascular anatomy.  CONTRAST: 80mL OMNIPAQUE IOHEXOL 350 MG/ML SOLN  COMPARISON: CT of the abdomen and pelvis 01/09/2015. Chest CT 05/28/2006.  FINDINGS: CTA CHEST FINDINGS  Mediastinum/Lymph Nodes: Heart size is borderline enlarged. There is no significant pericardial fluid, thickening or pericardial calcification. There is atherosclerosis of the thoracic aorta, the great vessels of the mediastinum and the coronary  arteries, including calcified atherosclerotic plaque in the left main, left anterior descending, left circumflex and right coronary arteries. Severe thickening and calcifications of the aortic valve and the mitral-aortic intervalvular fibrosa. Multiple prominent borderline enlarged and mildly enlarged mediastinal and bilateral hilar lymph nodes measuring up to 12 mm in short axis in the subcarinal nodal station. Esophagus is unremarkable in appearance. No axillary lymphadenopathy.  Lungs/Pleura: No acute consolidative airspace disease. No pleural effusions. Mild linear scarring in the right upper lobe and to a lesser extent in the inferior segment of the lingula. Small calcified granuloma in the periphery of the left lower lobe. No other larger more suspicious appearing pulmonary nodules or masses are otherwise noted.  Musculoskeletal/Soft Tissues: There are no aggressive appearing lytic or blastic lesions noted in the visualized portions of the skeleton.  CTA ABDOMEN AND PELVIS FINDINGS  Hepatobiliary: No discrete cystic or solid hepatic lesions. No intra or extrahepatic biliary ductal dilatation. Gallbladder is normal in appearance.  Pancreas: No pancreatic mass. No pancreatic ductal dilatation. No pancreatic or peripancreatic fluid or inflammatory changes.  Spleen: Unremarkable.  Adrenals/Urinary Tract: Normal bilateral adrenal glands. Sub cm low-attenuation lesions in the kidneys bilaterally too small to definitively characterize, but are statistically likely tiny cysts. No hydroureteronephrosis. Urinary bladder is normal in appearance.  Stomach/Bowel: The appearance of the stomach is normal. No pathologic dilatation of small bowel or colon. Normal appendix.  Vascular/Lymphatic: Vascular findings and measurements pertinent to potential TAVR procedure, as detailed below. No aneurysm or dissection identified in the abdominal or pelvic vasculature. Ulcerated  plaque in the infrarenal abdominal aorta at the level of the IMA origin (image 2 of 2 of series 401). Enlarged right external iliac lymph nodes measuring up to 14 mm in short axis. Left external iliac lymph nodes measuring up to 12 mm in short axis. No other lymphadenopathy noted elsewhere in  the abdomen or pelvis.  Reproductive: Prostate gland and seminal vesicles are unremarkable in appearance.  Other: Right inguinal hernia containing only fat. No significant volume of ascites. No pneumoperitoneum.  Musculoskeletal: Status post laminectomy at L3-L4-L5. There are no aggressive appearing lytic or blastic lesions noted in the visualized portions of the skeleton.  VASCULAR MEASUREMENTS PERTINENT TO TAVR:  AORTA:  Minimal Aortic Diameter - 12 x 10 mm  Severity of Aortic Calcification - severe  RIGHT PELVIS:  Right Common Iliac Artery -  Minimal Diameter - 9.9 x 7.5 mm  Tortuosity - mild  Calcification - mild to moderate  Right External Iliac Artery -  Minimal Diameter - 8.0 x 7.6 mm  Tortuosity - mild  Calcification - none  Right Common Femoral Artery -  Minimal Diameter - 6.2 x 7.1 mm  Tortuosity - mild  Calcification - mild  LEFT PELVIS:  Left Common Iliac Artery -  Minimal Diameter - 9.3 x 6.0 mm  Tortuosity - moderate  Calcification - moderate  Left External Iliac Artery -  Minimal Diameter - 8.1 x 7.1 mm  Tortuosity - mild  Calcification - minimal  Left Common Femoral Artery -  Minimal Diameter - 7.5 x 7.1 mm  Tortuosity - mild  Calcification - mild  Review of the MIP images confirms the above findings.  IMPRESSION: 1. Vascular findings and measurements pertinent to potential TAVR procedure, as detailed above. This patient does appear to have suitable pelvic arterial access bilaterally. 2. Severe thickening calcifications of the aortic valve, compatible with the patient's reported clinical  history of aortic stenosis. Notably, there is also extensive calcification of the mitral-aortic intervalvular fibrosa which may have implications for attempted transcatheter aortic valve placement. 3. Multiple borderline enlarged mildly enlarged mediastinal and external artery lymph nodes. This is nonspecific. Lymph nodes in the pelvis are similar to recent prior examination 01/09/2015 and may simply be reactive. Attention on any future followup examinations is recommended to ensure stability or resolution. 4. Additional incidental findings, as above.   Electronically Signed  By: Trudie Reed M.D.  On: 04/01/2015 10:37      STS Risk Calculator  ProcedureAVR + CABG  Risk of Mortality8.7% Morbidity or Mortality45.1% Prolonged LOS26.0% Short LOS10.4% Permanent Stroke6.0% Prolonged Vent Support31.1% DSW Infection1.2% Renal Failure18.6% Reoperation15.1%          Impression:  Patient has stage D severe symptomatic aortic stenosis. He presents with worsening symptoms of shortness of breath and fatigue consistent with chronic diastolic congestive heart failure, currently [place] Heart Association functional class IIIB-IV. He has been hospitalized at total of 3 times with acute exacerbations of congestive heart failure since last November, most recently last week. I have personally reviewed the patient's most recent transthoracic echocardiogram and diagnostic cardiac catheterization. The patient has severe thickening, calcification, and restricted leaflet mobility involving all 3 leaflets of the aortic valve. Peak velocity across the aortic  valve approaches 4 m/s and mean transvalvular gradient was measured 38 mmHg by catheterization, corresponding to the aortic valve area calculated 0.78 cm. Left ventricular systolic function remains reasonably well-preserved. Diagnostic cardiac catheterization demonstrates the presence of moderate coronary artery disease, most notably with long segment 60-70% stenosis of the mid right coronary artery and hazy ostial stenosis of the left circumflex coronary artery that is at least 50%. Risks associated with conventional surgical aortic valve replacement with or without coronary artery bypass grafting would be relatively high because of the patient's age and numerous comorbid medical problems. Cardiac gated CT angiogram of  the heart confirms the presence of severe aortic stenosis and reveals anatomical characteristics suitable for transcatheter aortic valve replacement without any significant complicating features other than a single area of calcification in the aortic annulus. As a result, I feel that TAVR might be a much less invasive and potentially less risky treatment alternative to conventional surgery. Although his coronary artery disease is significant, the patient is not having symptoms suggestive of angina pectoris and I think it could be treated medically for now and percutaneously in the future should symptoms develop. With respect to his chronic diastolic congestive heart failure and chronic respiratory insufficiency, the patient has not been doing well recently on medical therapy and may need something done sooner rather than later. Unfortunately, he does not have good dentition and will need extraction of his remaining teeth. However, in the absence of ongoing problems concerning for risk of infection, it might be best to defer dental extraction until after his aortic stenosis has been definitively treated.   Plan:  The patient was counseled at length regarding treatment alternatives for  management of severe symptomatic aortic stenosis. Alternative approaches such as conventional aortic valve replacement, transcatheter aortic valve replacement, and palliative medical therapy were compared and contrasted at length. The risks associated with conventional surgical aortic valve replacement were been discussed in detail, as were expectations for post-operative convalescence. Long-term prognosis with medical therapy was discussed. This discussion was placed in the context of the patient's own specific clinical presentation and past medical history, including the presence of coronary artery disease as noted on recent catheterization. All of his questions have been addressed. He hopes to proceed with TAVR in the near future as an alternative to high risk surgery.  Following the decision to proceed with transcatheter aortic valve replacement, a discussion has been held regarding what types of management strategies would be attempted intraoperatively in the event of life-threatening complications, including whether or not the patient would be considered a candidate for the use of cardiopulmonary bypass and/or conversion to open sternotomy for attempted surgical intervention.  The patient has been advised of a variety of complications that might develop including but not limited to risks of death, stroke, paravalvular leak, aortic dissection or other major vascular complications, aortic annulus rupture, device embolization, cardiac rupture or perforation, mitral regurgitation, acute myocardial infarction, arrhythmia, heart block or bradycardia requiring permanent pacemaker placement, congestive heart failure, respiratory failure, renal failure, pneumonia, infection, other late complications related to structural valve deterioration or migration, or other complications that might ultimately cause a temporary or permanent loss of functional independence or other long term morbidity.  The patient provides  full informed consent for the procedure as described and all questions were answered.     Salvatore Decent. Cornelius Moras, MD

## 2015-04-05 NOTE — Telephone Encounter (Signed)
I do not see in chart where anyone attempted to contact patient from this office. I see that patient had some tests done through Cardiology, advised patient that we did not call him but he may want to check with his cardio office to see if they tried to reach him regarding his test results.   Patient says he has appointment tomorrow and will discuss with them at that time. Nothing further needed.

## 2015-04-05 NOTE — Progress Notes (Signed)
Anesthesia Chart Review: Patient is an 81 year old male scheduled for TAVR, transfemoral approach on 04/06/15. Surgeons will be Dr. Excell Seltzer and Dr. Cornelius Moras.   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 Three hospitalizations since 05/2014 for acute on chronic diastolic CHF with prolonged hospitalization 01/09/15-01/28/15 complicated by hypoxic respiratory failure requiring a fairly prolonged course of mechanical ventilation and treated with 4 week course of IV antibiotics for coag-negative Staphylococcus bacteremia. BMI is consistent with obesity. PCP is Dr. [name]. Pulmonologist is Dr. Shelle Iron. Vascular surgeon is Dr. Arbie Cookey. Primary cardiologist is Dr. Donnie Aho.   03/26/15 EKG as interpreted by Dr. Royann Shivers (in-patient consult): Poor quality tracing, probably sinus rhythm, minor intraventricular conduction delay, no acute repolarization abnormalities. There is a better tracing from 03/15/15.   03/15/15 Cardiac cath: - Mid RCA lesion, 60% stenosed. - Ost RCA lesion, 40% stenosed. - Ost LM to LM lesion, 30% stenosed. - Ost Cx lesion, 50% stenosed.  - Prox LAD to Mid LAD lesion, 40% stenosed.  - Ost 1st Mrg to 1st Mrg lesion, 20% stenosed. The lesion was previously treated with a stent (unknown type) greater than two years ago. 1. Moderate stenosis of the right coronary artery 2. Heavy calcification with mild nonobstructive stenosis of the LAD 3. Mild to moderate ostial left circumflex stenosis with continued patency of the stented segment in the first OM 4. Calcified aortic valve with hemodynamic findings consistent with severe aortic stenosis Recommend: Cardiac surgical evaluation for multidisciplinary approach to this patient with  multiple comorbid medical conditions and severe symptomatic aortic stenosis.  01/09/15 Echo: Study Conclusions - Left ventricle: The cavity size was normal. There was mild concentric hypertrophy. Systolic function was normal. The estimated ejection fraction was in the range of 55% to 60%. Although no diagnostic regional wall motion abnormality was identified, this possibility cannot be completely excluded on the basis of this study. Features are consistent with a pseudonormal left ventricular filling pattern, with concomitant abnormal relaxation and increased filling pressure (grade 2 diastolic dysfunction). - Aortic valve: Cusp separation was reduced. There was moderate stenosis. Peak velocity (S): 386 cm/s. Mean gradient (S): 32 mm Hg. - Mitral valve: Moderately calcified annulus. - Left atrium: The atrium was mildly to moderately dilated. Impressions: - When compared to prior echocardiogram, aortic stenosis has advanced.  08/18/14 Carotid duplex: One year follow-up recommended.  ADDITIONAL FINDINGS: Very technically difficult study due to thick neck, high bifurcation, and acoustic shadow.    IMPRESSION: 1. Unable to identify exact bifurcation due to acoustic shadow. There is evidence of significant stenosis in the distal common carotid artery and external carotid artery, unable to thoroughly evaluate proximal internal carotid artery; however, waveforms in the mid segment display post stenotic turbulence. 2. Widely patent left carotid endarterectomy without evidence of restenosis or hyperplasia. 3. Bilateral vertebral artery is antegrade.     Compared to the previous exam:  Velocities on the right have increased significantly compared to previous exam.       04/02/15 CXR: IMPRESSION: Chronically increased pulmonary interstitial markings may reflect low-grade chronic interstitial edema. There is no alveolar pneumonia. Stable borderline cardiomegaly and  central pulmonary vascular congestion.  03/31/15 PFTs: FVC 1.99 (56%), FEV1 1.56 (62%), DLCOunc 18.96 ()67%).  Preoperative labs noted. Cr 1.68 (known CKD stage III, but  with overall wide variation of Cr this year ranging from 0.86-2.71; primarily range has been 1.3 - 2.00 over the past year.) A1C 6.0. [facility] RN Alycia Rossetti reports the surgeon's have reviewed his labs and are having him come in early on the day of surgery for IV hydration. He had acute on chronic renal failure ~ 12/2014 in the setting of worsening AS, acute on chronic diastolic CHF, bacteremia and s/p /2016 following a-gram to evaluate a non-healing right foot wound.  He will be evaluated by his anesthesiologists and surgeons on the day of surgery to ensure no acute changes prior to proceeding.   Velna Ochs [facility] Short Stay Center/Anesthesiology Phone [phone] 04/05/2015 11:15 AM

## 2015-04-06 ENCOUNTER — Inpatient Hospital Stay (HOSPITAL_COMMUNITY): Payer: Medicare Other

## 2015-04-06 ENCOUNTER — Inpatient Hospital Stay (HOSPITAL_COMMUNITY): Payer: Medicare Other | Admitting: Vascular Surgery

## 2015-04-06 ENCOUNTER — Encounter (HOSPITAL_COMMUNITY)
Admission: RE | Disposition: A | Discharge status: Home Health | Payer: Medicare Other | Source: Ambulatory Visit | Attending: Cardiovascular Disease

## 2015-04-06 ENCOUNTER — Inpatient Hospital Stay (HOSPITAL_COMMUNITY)
Admission: RE | Admit: 2015-04-06 | Discharge: 2015-04-09 | DRG: 266 | Disposition: A | Discharge status: Home Health | Payer: Medicare Other | Source: Ambulatory Visit | Attending: Cardiovascular Disease | Admitting: Cardiovascular Disease

## 2015-04-06 ENCOUNTER — Inpatient Hospital Stay (HOSPITAL_COMMUNITY): Payer: Medicare Other | Admitting: Certified Registered Nurse Anesthetist

## 2015-04-06 ENCOUNTER — Encounter (HOSPITAL_COMMUNITY): Payer: Self-pay | Admitting: Certified Registered Nurse Anesthetist

## 2015-04-06 DIAGNOSIS — I350 Nonrheumatic aortic (valve) stenosis: Secondary | ICD-10-CM | POA: Diagnosis not present

## 2015-04-06 DIAGNOSIS — I872 Venous insufficiency (chronic) (peripheral): Secondary | ICD-10-CM | POA: Diagnosis present

## 2015-04-06 DIAGNOSIS — Z006 Encounter for examination for normal comparison and control in clinical research program: Secondary | ICD-10-CM

## 2015-04-06 DIAGNOSIS — N289 Disorder of kidney and ureter, unspecified: Secondary | ICD-10-CM

## 2015-04-06 DIAGNOSIS — I351 Nonrheumatic aortic (valve) insufficiency: Secondary | ICD-10-CM | POA: Diagnosis present

## 2015-04-06 DIAGNOSIS — Z7902 Long term (current) use of antithrombotics/antiplatelets: Secondary | ICD-10-CM

## 2015-04-06 DIAGNOSIS — E1122 Type 2 diabetes mellitus with diabetic chronic kidney disease: Secondary | ICD-10-CM | POA: Diagnosis present

## 2015-04-06 DIAGNOSIS — Z9842 Cataract extraction status, left eye: Secondary | ICD-10-CM | POA: Diagnosis not present

## 2015-04-06 DIAGNOSIS — I252 Old myocardial infarction: Secondary | ICD-10-CM

## 2015-04-06 DIAGNOSIS — D62 Acute posthemorrhagic anemia: Secondary | ICD-10-CM | POA: Diagnosis not present

## 2015-04-06 DIAGNOSIS — M1990 Unspecified osteoarthritis, unspecified site: Secondary | ICD-10-CM | POA: Diagnosis present

## 2015-04-06 DIAGNOSIS — I359 Nonrheumatic aortic valve disorder, unspecified: Secondary | ICD-10-CM | POA: Diagnosis present

## 2015-04-06 DIAGNOSIS — I119 Hypertensive heart disease without heart failure: Secondary | ICD-10-CM | POA: Diagnosis present

## 2015-04-06 DIAGNOSIS — N183 Chronic kidney disease, stage 3 unspecified: Secondary | ICD-10-CM | POA: Diagnosis present

## 2015-04-06 DIAGNOSIS — I2510 Atherosclerotic heart disease of native coronary artery without angina pectoris: Secondary | ICD-10-CM | POA: Diagnosis present

## 2015-04-06 DIAGNOSIS — Z79899 Other long term (current) drug therapy: Secondary | ICD-10-CM

## 2015-04-06 DIAGNOSIS — I130 Hypertensive heart and chronic kidney disease with heart failure and stage 1 through stage 4 chronic kidney disease, or unspecified chronic kidney disease: Secondary | ICD-10-CM | POA: Diagnosis present

## 2015-04-06 DIAGNOSIS — E1159 Type 2 diabetes mellitus with other circulatory complications: Secondary | ICD-10-CM | POA: Diagnosis present

## 2015-04-06 DIAGNOSIS — Z952 Presence of prosthetic heart valve: Secondary | ICD-10-CM

## 2015-04-06 DIAGNOSIS — Z8673 Personal history of transient ischemic attack (TIA), and cerebral infarction without residual deficits: Secondary | ICD-10-CM | POA: Diagnosis not present

## 2015-04-06 DIAGNOSIS — I361 Nonrheumatic tricuspid (valve) insufficiency: Secondary | ICD-10-CM | POA: Diagnosis not present

## 2015-04-06 DIAGNOSIS — G4733 Obstructive sleep apnea (adult) (pediatric): Secondary | ICD-10-CM | POA: Diagnosis present

## 2015-04-06 DIAGNOSIS — Z954 Presence of other heart-valve replacement: Secondary | ICD-10-CM | POA: Diagnosis not present

## 2015-04-06 DIAGNOSIS — K219 Gastro-esophageal reflux disease without esophagitis: Secondary | ICD-10-CM | POA: Diagnosis present

## 2015-04-06 DIAGNOSIS — E662 Morbid (severe) obesity with alveolar hypoventilation: Secondary | ICD-10-CM | POA: Diagnosis present

## 2015-04-06 DIAGNOSIS — D696 Thrombocytopenia, unspecified: Secondary | ICD-10-CM | POA: Diagnosis present

## 2015-04-06 DIAGNOSIS — Z9841 Cataract extraction status, right eye: Secondary | ICD-10-CM | POA: Diagnosis not present

## 2015-04-06 DIAGNOSIS — Z7982 Long term (current) use of aspirin: Secondary | ICD-10-CM | POA: Diagnosis not present

## 2015-04-06 DIAGNOSIS — N400 Enlarged prostate without lower urinary tract symptoms: Secondary | ICD-10-CM | POA: Diagnosis present

## 2015-04-06 DIAGNOSIS — I5032 Chronic diastolic (congestive) heart failure: Secondary | ICD-10-CM | POA: Diagnosis present

## 2015-04-06 DIAGNOSIS — I5033 Acute on chronic diastolic (congestive) heart failure: Secondary | ICD-10-CM | POA: Diagnosis present

## 2015-04-06 DIAGNOSIS — J9811 Atelectasis: Secondary | ICD-10-CM | POA: Diagnosis not present

## 2015-04-06 DIAGNOSIS — J9611 Chronic respiratory failure with hypoxia: Secondary | ICD-10-CM | POA: Diagnosis present

## 2015-04-06 DIAGNOSIS — R0602 Shortness of breath: Secondary | ICD-10-CM | POA: Diagnosis present

## 2015-04-06 DIAGNOSIS — L89329 Pressure ulcer of left buttock, unspecified stage: Secondary | ICD-10-CM | POA: Diagnosis present

## 2015-04-06 DIAGNOSIS — Z888 Allergy status to other drugs, medicaments and biological substances status: Secondary | ICD-10-CM | POA: Diagnosis not present

## 2015-04-06 DIAGNOSIS — E669 Obesity, unspecified: Secondary | ICD-10-CM | POA: Diagnosis present

## 2015-04-06 HISTORY — PX: TRANSCATHETER AORTIC VALVE REPLACEMENT, TRANSFEMORAL: SHX6400

## 2015-04-06 HISTORY — DX: Presence of prosthetic heart valve: Z95.2

## 2015-04-06 HISTORY — PX: TEE WITHOUT CARDIOVERSION: SHX5443

## 2015-04-06 LAB — POCT I-STAT 4, (NA,K, GLUC, HGB,HCT)
Glucose, Bld: 109 mg/dL — ABNORMAL HIGH (ref 65–99)
HCT: 42 % (ref 39.0–52.0)
Hemoglobin: 14.3 g/dL (ref 13.0–17.0)
Potassium: 4.7 mmol/L (ref 3.5–5.1)
Sodium: 142 mmol/L (ref 135–145)

## 2015-04-06 LAB — POCT I-STAT 3, ART BLOOD GAS (G3+)
Acid-Base Excess: 2 mmol/L (ref 0.0–2.0)
Bicarbonate: 28.8 mEq/L — ABNORMAL HIGH (ref 20.0–24.0)
O2 Saturation: 91 %
PCO2 ART: 52.3 mmHg — AB (ref 35.0–45.0)
PH ART: 7.342 — AB (ref 7.350–7.450)
PO2 ART: 62 mmHg — AB (ref 80.0–100.0)
Patient temperature: 35.6
TCO2: 30 mmol/L (ref 0–100)

## 2015-04-06 LAB — CBC
HCT: 41.8 % (ref 39.0–52.0)
HEMATOCRIT: 40 % (ref 39.0–52.0)
Hemoglobin: 13.1 g/dL (ref 13.0–17.0)
Hemoglobin: 12.5 g/dL — ABNORMAL LOW (ref 13.0–17.0)
MCH: 29.6 pg (ref 26.0–34.0)
MCH: 29.6 pg (ref 26.0–34.0)
MCHC: 31.3 g/dL (ref 30.0–36.0)
MCHC: 31.3 g/dL (ref 30.0–36.0)
MCV: 94.4 fL (ref 78.0–100.0)
MCV: 94.6 fL (ref 78.0–100.0)
PLATELETS: 120 10*3/uL — AB (ref 150–400)
Platelets: 111 10*3/uL — ABNORMAL LOW (ref 150–400)
RBC: 4.43 MIL/uL (ref 4.22–5.81)
RBC: 4.23 MIL/uL (ref 4.22–5.81)
RDW: 15.8 % — AB (ref 11.5–15.5)
RDW: 16.1 % — AB (ref 11.5–15.5)
WBC: 6.4 10*3/uL (ref 4.0–10.5)
WBC: 7.3 10*3/uL (ref 4.0–10.5)

## 2015-04-06 LAB — POCT I-STAT, CHEM 8
BUN: 25 mg/dL — AB (ref 6–20)
BUN: 25 mg/dL — AB (ref 6–20)
BUN: 25 mg/dL — ABNORMAL HIGH (ref 6–20)
BUN: 23 mg/dL — AB (ref 6–20)
CALCIUM ION: 1.15 mmol/L (ref 1.13–1.30)
CALCIUM ION: 1.18 mmol/L (ref 1.13–1.30)
CALCIUM ION: 1.15 mmol/L (ref 1.13–1.30)
CHLORIDE: 104 mmol/L (ref 101–111)
CREATININE: 1.2 mg/dL (ref 0.61–1.24)
CREATININE: 1.2 mg/dL (ref 0.61–1.24)
CREATININE: 1.2 mg/dL (ref 0.61–1.24)
Calcium, Ion: 1.18 mmol/L (ref 1.13–1.30)
Chloride: 105 mmol/L (ref 101–111)
Chloride: 105 mmol/L (ref 101–111)
Chloride: 103 mmol/L (ref 101–111)
Creatinine, Ser: 1.3 mg/dL — ABNORMAL HIGH (ref 0.61–1.24)
GLUCOSE: 103 mg/dL — AB (ref 65–99)
GLUCOSE: 110 mg/dL — AB (ref 65–99)
GLUCOSE: 113 mg/dL — AB (ref 65–99)
Glucose, Bld: 100 mg/dL — ABNORMAL HIGH (ref 65–99)
HCT: 39 % (ref 39.0–52.0)
HCT: 40 % (ref 39.0–52.0)
HCT: 40 % (ref 39.0–52.0)
HEMATOCRIT: 40 % (ref 39.0–52.0)
HEMOGLOBIN: 13.6 g/dL (ref 13.0–17.0)
HEMOGLOBIN: 13.6 g/dL (ref 13.0–17.0)
Hemoglobin: 13.3 g/dL (ref 13.0–17.0)
Hemoglobin: 13.6 g/dL (ref 13.0–17.0)
Potassium: 4.3 mmol/L (ref 3.5–5.1)
Potassium: 4.5 mmol/L (ref 3.5–5.1)
Potassium: 4.5 mmol/L (ref 3.5–5.1)
Potassium: 4.7 mmol/L (ref 3.5–5.1)
SODIUM: 141 mmol/L (ref 135–145)
SODIUM: 142 mmol/L (ref 135–145)
Sodium: 141 mmol/L (ref 135–145)
Sodium: 141 mmol/L (ref 135–145)
TCO2: 28 mmol/L (ref 0–100)
TCO2: 30 mmol/L (ref 0–100)
TCO2: 26 mmol/L (ref 0–100)
TCO2: 27 mmol/L (ref 0–100)

## 2015-04-06 LAB — PROTIME-INR
INR: 1.14 (ref 0.00–1.49)
Prothrombin Time: 14.8 seconds (ref 11.6–15.2)

## 2015-04-06 LAB — GLUCOSE, CAPILLARY
GLUCOSE-CAPILLARY: 93 mg/dL (ref 65–99)
GLUCOSE-CAPILLARY: 85 mg/dL (ref 65–99)
GLUCOSE-CAPILLARY: 108 mg/dL — AB (ref 65–99)
Glucose-Capillary: 95 mg/dL (ref 65–99)

## 2015-04-06 LAB — CREATININE, SERUM
Creatinine, Ser: 1.41 mg/dL — ABNORMAL HIGH (ref 0.61–1.24)
GFR calc Af Amer: 53 mL/min — ABNORMAL LOW (ref >60)
GFR calc non Af Amer: 45 mL/min — ABNORMAL LOW (ref >60)

## 2015-04-06 LAB — APTT: aPTT: 30 seconds (ref 24–37)

## 2015-04-06 LAB — MAGNESIUM: Magnesium: 2.1 mg/dL (ref 1.7–2.4)

## 2015-04-06 LAB — PREPARE RBC (CROSSMATCH)

## 2015-04-06 SURGERY — IMPLANTATION, AORTIC VALVE, TRANSCATHETER, FEMORAL APPROACH
Anesthesia: General | Site: Chest

## 2015-04-06 MED ORDER — SUGAMMADEX SODIUM 200 MG/2ML IV SOLN
INTRAVENOUS | Status: DC | PRN
  Administered 2015-04-06: 208.6 mg via INTRAVENOUS

## 2015-04-06 MED ORDER — INSULIN ASPART 100 UNIT/ML ~~LOC~~ SOLN
0.0000 [IU] | Freq: Three times a day (TID) | SUBCUTANEOUS | Status: DC
  Administered 2015-04-07 – 2015-04-08 (×3): 2 [IU] via SUBCUTANEOUS

## 2015-04-06 MED ORDER — PROPOFOL 10 MG/ML IV BOLUS
INTRAVENOUS | Status: DC | PRN
  Administered 2015-04-06: 40 mg via INTRAVENOUS

## 2015-04-06 MED ORDER — ONDANSETRON HCL 4 MG/2ML IJ SOLN
INTRAMUSCULAR | Status: DC | PRN
  Administered 2015-04-06: 4 mg via INTRAVENOUS

## 2015-04-06 MED ORDER — GERHARDT'S BUTT CREAM
TOPICAL_CREAM | Freq: Two times a day (BID) | CUTANEOUS | Status: DC
  Administered 2015-04-06: 22:00:00 via TOPICAL
  Administered 2015-04-07: 1 via TOPICAL
  Administered 2015-04-07 – 2015-04-09 (×4): via TOPICAL
  Filled 2015-04-06: qty 1

## 2015-04-06 MED ORDER — FENTANYL CITRATE (PF) 100 MCG/2ML IJ SOLN
INTRAMUSCULAR | Status: DC | PRN
  Administered 2015-04-06: 150 ug via INTRAVENOUS
  Administered 2015-04-06: 50 ug via INTRAVENOUS

## 2015-04-06 MED ORDER — SODIUM CHLORIDE 0.9 % IV SOLN: Freq: Once | INTRAVENOUS | Status: DC

## 2015-04-06 MED ORDER — ONDANSETRON HCL 4 MG/2ML IJ SOLN: 4.0000 mg | Freq: Four times a day (QID) | INTRAMUSCULAR | Status: DC | PRN

## 2015-04-06 MED ORDER — DEXTROSE 5 % IV SOLN
1.5000 g | Freq: Two times a day (BID) | INTRAVENOUS | Status: AC
  Administered 2015-04-06 – 2015-04-08 (×4): 1.5 g via INTRAVENOUS
  Filled 2015-04-06 (×4): qty 1.5

## 2015-04-06 MED ORDER — LACTATED RINGERS IV SOLN
INTRAVENOUS | Status: DC | PRN
  Administered 2015-04-06: 12:00:00 via INTRAVENOUS

## 2015-04-06 MED ORDER — MORPHINE SULFATE (PF) 2 MG/ML IV SOLN: 2.0000 mg | INTRAVENOUS | Status: DC | PRN

## 2015-04-06 MED ORDER — IODIXANOL 320 MG/ML IV SOLN
INTRAVENOUS | Status: DC | PRN
  Administered 2015-04-06: 75.7 mL via INTRAVENOUS

## 2015-04-06 MED ORDER — 0.9 % SODIUM CHLORIDE (POUR BTL) OPTIME
TOPICAL | Status: DC | PRN
  Administered 2015-04-06: 5000 mL

## 2015-04-06 MED ORDER — VANCOMYCIN HCL IN DEXTROSE 1-5 GM/200ML-% IV SOLN
1000.0000 mg | Freq: Once | INTRAVENOUS | Status: AC
  Administered 2015-04-07: 1000 mg via INTRAVENOUS
  Filled 2015-04-06: qty 200

## 2015-04-06 MED ORDER — PANTOPRAZOLE SODIUM 40 MG PO TBEC
40.0000 mg | DELAYED_RELEASE_TABLET | Freq: Every day | ORAL | Status: DC
  Administered 2015-04-08 – 2015-04-09 (×2): 40 mg via ORAL
  Filled 2015-04-06 (×2): qty 1

## 2015-04-06 MED ORDER — PROTAMINE SULFATE 10 MG/ML IV SOLN
INTRAVENOUS | Status: DC | PRN
  Administered 2015-04-06: 150 mg via INTRAVENOUS

## 2015-04-06 MED ORDER — DEXMEDETOMIDINE HCL IN NACL 200 MCG/50ML IV SOLN: 0.1000 ug/kg/h | INTRAVENOUS | Status: DC

## 2015-04-06 MED ORDER — DEXTROSE 5 % IV SOLN
0.0000 ug/min | INTRAVENOUS | Status: DC
  Filled 2015-04-06: qty 2

## 2015-04-06 MED ORDER — FENTANYL CITRATE (PF) 250 MCG/5ML IJ SOLN
INTRAMUSCULAR | Status: AC
  Filled 2015-04-06: qty 5

## 2015-04-06 MED ORDER — BUPROPION HCL ER (XL) 150 MG PO TB24
150.0000 mg | ORAL_TABLET | Freq: Every morning | ORAL | Status: DC
  Administered 2015-04-07 – 2015-04-09 (×3): 150 mg via ORAL
  Filled 2015-04-06 (×3): qty 1

## 2015-04-06 MED ORDER — ACETAMINOPHEN 500 MG PO TABS
1000.0000 mg | ORAL_TABLET | Freq: Four times a day (QID) | ORAL | Status: DC
  Administered 2015-04-07 – 2015-04-08 (×4): 1000 mg via ORAL
  Filled 2015-04-06 (×10): qty 2

## 2015-04-06 MED ORDER — MIRTAZAPINE 15 MG PO TABS
15.0000 mg | ORAL_TABLET | Freq: Every day | ORAL | Status: DC
  Administered 2015-04-06 – 2015-04-08 (×3): 15 mg via ORAL
  Filled 2015-04-06 (×5): qty 1

## 2015-04-06 MED ORDER — DEXMEDETOMIDINE HCL IN NACL 400 MCG/100ML IV SOLN
INTRAVENOUS | Status: DC | PRN
  Administered 2015-04-06: .3 ug/kg/h via INTRAVENOUS

## 2015-04-06 MED ORDER — SODIUM CHLORIDE 0.9 % IV SOLN
20.0000 mg | INTRAVENOUS | Status: DC | PRN
  Administered 2015-04-06: 10 ug/min via INTRAVENOUS

## 2015-04-06 MED ORDER — CLOPIDOGREL BISULFATE 75 MG PO TABS
75.0000 mg | ORAL_TABLET | Freq: Every day | ORAL | Status: DC
  Administered 2015-04-07 – 2015-04-09 (×3): 75 mg via ORAL
  Filled 2015-04-06 (×3): qty 1

## 2015-04-06 MED ORDER — PREGABALIN 100 MG PO CAPS
200.0000 mg | ORAL_CAPSULE | Freq: Every day | ORAL | Status: DC
  Administered 2015-04-06 – 2015-04-08 (×3): 200 mg via ORAL
  Filled 2015-04-06: qty 2
  Filled 2015-04-06: qty 4
  Filled 2015-04-06: qty 2

## 2015-04-06 MED ORDER — OXYCODONE HCL 5 MG PO TABS
5.0000 mg | ORAL_TABLET | ORAL | Status: DC | PRN
  Administered 2015-04-07 – 2015-04-08 (×3): 10 mg via ORAL
  Filled 2015-04-06 (×3): qty 2

## 2015-04-06 MED ORDER — PHENYLEPHRINE HCL 10 MG/ML IJ SOLN
INTRAMUSCULAR | Status: DC | PRN
  Administered 2015-04-06 (×2): 120 ug via INTRAVENOUS

## 2015-04-06 MED ORDER — SODIUM CHLORIDE 0.9 % IR SOLN
Status: DC | PRN
  Administered 2015-04-06: 1500 mL

## 2015-04-06 MED ORDER — MIRABEGRON ER 50 MG PO TB24
50.0000 mg | ORAL_TABLET | Freq: Every day | ORAL | Status: DC
  Administered 2015-04-07 – 2015-04-09 (×3): 50 mg via ORAL
  Filled 2015-04-06 (×3): qty 1

## 2015-04-06 MED ORDER — SODIUM CHLORIDE 0.9 % IV SOLN
INTRAVENOUS | Status: DC
  Administered 2015-04-06 (×2): via INTRAVENOUS

## 2015-04-06 MED ORDER — ALBUMIN HUMAN 5 % IV SOLN: 250.0000 mL | INTRAVENOUS | Status: AC | PRN

## 2015-04-06 MED ORDER — SODIUM CHLORIDE 0.9 % IV SOLN
INTRAVENOUS | Status: DC
  Filled 2015-04-06: qty 2.5

## 2015-04-06 MED ORDER — TRAMADOL HCL 50 MG PO TABS
50.0000 mg | ORAL_TABLET | ORAL | Status: DC | PRN
Start: 2015-04-06 — End: 2015-04-09

## 2015-04-06 MED ORDER — ROPINIROLE HCL 1 MG PO TABS
4.0000 mg | ORAL_TABLET | Freq: Every day | ORAL | Status: DC
  Administered 2015-04-06 – 2015-04-08 (×3): 4 mg via ORAL
  Filled 2015-04-06 (×4): qty 4

## 2015-04-06 MED ORDER — MORPHINE SULFATE (PF) 2 MG/ML IV SOLN: 1.0000 mg | INTRAVENOUS | Status: AC | PRN

## 2015-04-06 MED ORDER — SODIUM CHLORIDE 0.9 % IV SOLN
250.0000 mL | INTRAVENOUS | Status: DC | PRN
  Administered 2015-04-06: 10 mL via INTRAVENOUS

## 2015-04-06 MED ORDER — FAMOTIDINE IN NACL 20-0.9 MG/50ML-% IV SOLN
20.0000 mg | Freq: Two times a day (BID) | INTRAVENOUS | Status: AC
  Administered 2015-04-06: 20 mg via INTRAVENOUS
  Filled 2015-04-06: qty 50

## 2015-04-06 MED ORDER — ACETAMINOPHEN 160 MG/5ML PO SOLN: 1000.0000 mg | Freq: Four times a day (QID) | ORAL | Status: DC

## 2015-04-06 MED ORDER — LACTATED RINGERS IV SOLN: 500.0000 mL | Freq: Once | INTRAVENOUS | Status: DC | PRN

## 2015-04-06 MED ORDER — NITROGLYCERIN IN D5W 200-5 MCG/ML-% IV SOLN: 0.0000 ug/min | INTRAVENOUS | Status: DC

## 2015-04-06 MED ORDER — SODIUM CHLORIDE 0.9 % IV SOLN
1.0000 mL/kg/h | INTRAVENOUS | Status: AC
  Administered 2015-04-06: 1 mL/kg/h via INTRAVENOUS

## 2015-04-06 MED ORDER — HEPARIN SODIUM (PORCINE) 1000 UNIT/ML IJ SOLN
INTRAMUSCULAR | Status: DC | PRN
  Administered 2015-04-06: 8000 [IU] via INTRAVENOUS

## 2015-04-06 MED ORDER — ACETAMINOPHEN 650 MG RE SUPP: 650.0000 mg | Freq: Once | RECTAL | Status: DC

## 2015-04-06 MED ORDER — ALLOPURINOL 300 MG PO TABS
300.0000 mg | ORAL_TABLET | Freq: Every morning | ORAL | Status: DC
  Administered 2015-04-07 – 2015-04-09 (×3): 300 mg via ORAL
  Filled 2015-04-06 (×3): qty 1

## 2015-04-06 MED ORDER — CHLORHEXIDINE GLUCONATE 4 % EX LIQD: 60.0000 mL | Freq: Once | CUTANEOUS | Status: DC

## 2015-04-06 MED ORDER — SODIUM CHLORIDE 0.9 % IV SOLN
4000.0000 ug | INTRAVENOUS | Status: DC | PRN
  Administered 2015-04-06: 2 ug/min via INTRAVENOUS

## 2015-04-06 MED ORDER — MIDAZOLAM HCL 2 MG/2ML IJ SOLN: 2.0000 mg | INTRAMUSCULAR | Status: DC | PRN

## 2015-04-06 MED ORDER — SODIUM CHLORIDE 0.9 % IJ SOLN: 3.0000 mL | INTRAMUSCULAR | Status: DC | PRN

## 2015-04-06 MED ORDER — ACETAMINOPHEN 160 MG/5ML PO SOLN: 650.0000 mg | Freq: Once | ORAL | Status: DC

## 2015-04-06 MED ORDER — INSULIN REGULAR BOLUS VIA INFUSION
0.0000 [IU] | Freq: Three times a day (TID) | INTRAVENOUS | Status: DC
  Filled 2015-04-06: qty 10

## 2015-04-06 MED ORDER — ASPIRIN EC 81 MG PO TBEC
81.0000 mg | DELAYED_RELEASE_TABLET | Freq: Every day | ORAL | Status: DC
  Administered 2015-04-07 – 2015-04-09 (×3): 81 mg via ORAL
  Filled 2015-04-06 (×3): qty 1

## 2015-04-06 MED ORDER — PROTAMINE SULFATE 10 MG/ML IV SOLN
INTRAVENOUS | Status: AC
  Filled 2015-04-06: qty 5

## 2015-04-06 MED ORDER — FERROUS GLUCONATE 324 (38 FE) MG PO TABS
324.0000 mg | ORAL_TABLET | Freq: Every day | ORAL | Status: DC
  Administered 2015-04-07 – 2015-04-09 (×3): 324 mg via ORAL
  Filled 2015-04-06 (×3): qty 1

## 2015-04-06 MED ORDER — LIDOCAINE HCL (CARDIAC) 20 MG/ML IV SOLN
INTRAVENOUS | Status: AC
  Filled 2015-04-06: qty 10

## 2015-04-06 MED ORDER — ROCURONIUM BROMIDE 100 MG/10ML IV SOLN
INTRAVENOUS | Status: DC | PRN
  Administered 2015-04-06: 50 mg via INTRAVENOUS

## 2015-04-06 MED ORDER — CHLORHEXIDINE GLUCONATE 4 % EX LIQD
60.0000 mL | Freq: Once | CUTANEOUS | Status: DC
Start: 2015-04-06 — End: 2015-04-06

## 2015-04-06 MED ORDER — SODIUM CHLORIDE 0.9 % IJ SOLN
3.0000 mL | Freq: Two times a day (BID) | INTRAMUSCULAR | Status: DC
  Administered 2015-04-06: 3 mL via INTRAVENOUS

## 2015-04-06 MED ORDER — SUGAMMADEX SODIUM 500 MG/5ML IV SOLN
INTRAVENOUS | Status: AC
  Filled 2015-04-06: qty 5

## 2015-04-06 SURGICAL SUPPLY — 107 items
ADAPTER UNIV SWAN GANZ BIP (ADAPTER) IMPLANT
ADAPTER UNV SWAN GANZ BIP (ADAPTER) ×4
ADH SKN CLS APL DERMABOND .7 (GAUZE/BANDAGES/DRESSINGS) ×2
ADPR CATH UNV NS SG CATH (ADAPTER) ×4
ATTRACTOMAT 16X20 MAGNETIC DRP (DRAPES) IMPLANT
BAG BANDED W/RUBBER/TAPE 36X54 (MISCELLANEOUS) ×4 IMPLANT
BAG DECANTER FOR FLEXI CONT (MISCELLANEOUS) IMPLANT
BAG EQP BAND 135X91 W/RBR TAPE (MISCELLANEOUS) ×2
BAG SNAP BAND KOVER 36X36 (MISCELLANEOUS) ×8 IMPLANT
BLADE 10 SAFETY STRL DISP (BLADE) ×2 IMPLANT
BLADE STERNUM SYSTEM 6 (BLADE) ×4 IMPLANT
BLADE SURG ROTATE 9660 (MISCELLANEOUS) ×2 IMPLANT
CABLE PACING FASLOC BIEGE (MISCELLANEOUS) ×2 IMPLANT
CABLE PACING FASLOC BLUE (MISCELLANEOUS) ×2 IMPLANT
CANISTER SUCTION 2500CC (MISCELLANEOUS) IMPLANT
CANNULA FEM VENOUS REMOTE 22FR (CANNULA) IMPLANT
CANNULA OPTISITE PERFUSION 16F (CANNULA) IMPLANT
CANNULA OPTISITE PERFUSION 18F (CANNULA) IMPLANT
CATH DIAG EXPO 6F AL2 (CATHETERS) ×2 IMPLANT
CATH DIAG EXPO 6F VENT PIG 145 (CATHETERS) ×4 IMPLANT
CATH S G BIP PACING (SET/KITS/TRAYS/PACK) ×6 IMPLANT
CATH SOFT-VU 4F 65 STRAIGHT (CATHETERS) IMPLANT
CATH SOFT-VU STRAIGHT 4F 65CM (CATHETERS) ×4
CLIP TI MEDIUM 24 (CLIP) ×4 IMPLANT
CLIP TI WIDE RED SMALL 24 (CLIP) ×4 IMPLANT
CONT SPEC 4OZ CLIKSEAL STRL BL (MISCELLANEOUS) ×6 IMPLANT
COVER BACK TABLE 24X17X13 BIG (DRAPES) ×2 IMPLANT
COVER DOME SNAP 22 D (MISCELLANEOUS) ×4 IMPLANT
COVER MAYO STAND STRL (DRAPES) ×4 IMPLANT
COVER TABLE BACK 60X90 (DRAPES) ×4 IMPLANT
CRADLE DONUT ADULT HEAD (MISCELLANEOUS) ×4 IMPLANT
DERMABOND ADVANCED (GAUZE/BANDAGES/DRESSINGS) ×2
DERMABOND ADVANCED .7 DNX12 (GAUZE/BANDAGES/DRESSINGS) ×2 IMPLANT
DRAPE INCISE IOBAN 66X45 STRL (DRAPES) ×2 IMPLANT
DRAPE SLUSH MACHINE 52X66 (DRAPES) ×4 IMPLANT
DRAPE TABLE COVER HEAVY DUTY (DRAPES) ×4 IMPLANT
DRSG TEGADERM 4X4.75 (GAUZE/BANDAGES/DRESSINGS) ×4 IMPLANT
ELECT REM PT RETURN 9FT ADLT (ELECTROSURGICAL) ×8
ELECTRODE REM PT RTRN 9FT ADLT (ELECTROSURGICAL) ×4 IMPLANT
FELT TEFLON 6X6 (MISCELLANEOUS) ×2 IMPLANT
FEMORAL VENOUS CANN RAP (CANNULA) IMPLANT
GAUZE SPONGE 4X4 12PLY STRL (GAUZE/BANDAGES/DRESSINGS) ×4 IMPLANT
GLOVE ECLIPSE 7.5 STRL STRAW (GLOVE) ×4 IMPLANT
GLOVE ECLIPSE 8.0 STRL XLNG CF (GLOVE) ×6 IMPLANT
GLOVE EUDERMIC 7 POWDERFREE (GLOVE) ×2 IMPLANT
GLOVE ORTHO TXT STRL SZ7.5 (GLOVE) ×4 IMPLANT
GOWN STRL REUS W/ TWL LRG LVL3 (GOWN DISPOSABLE) ×6 IMPLANT
GOWN STRL REUS W/ TWL XL LVL3 (GOWN DISPOSABLE) ×12 IMPLANT
GOWN STRL REUS W/TWL LRG LVL3 (GOWN DISPOSABLE) ×16
GOWN STRL REUS W/TWL XL LVL3 (GOWN DISPOSABLE) ×24
GUIDEWIRE SAF TJ AMPL .035X180 (WIRE) ×4 IMPLANT
GUIDEWIRE SAFE TJ AMPLATZ EXST (WIRE) ×2 IMPLANT
GUIDEWIRE STRAIGHT .035 260CM (WIRE) ×2 IMPLANT
INSERT FOGARTY 61MM (MISCELLANEOUS) ×4 IMPLANT
INSERT FOGARTY SM (MISCELLANEOUS) ×8 IMPLANT
INSERT FOGARTY XLG (MISCELLANEOUS) IMPLANT
KIT BASIN OR (CUSTOM PROCEDURE TRAY) ×4 IMPLANT
KIT DILATOR VASC 18G NDL (KITS) IMPLANT
KIT HEART LEFT (KITS) ×2 IMPLANT
KIT ROOM TURNOVER OR (KITS) ×4 IMPLANT
KIT SUCTION CATH 14FR (SUCTIONS) ×6 IMPLANT
MARKER SKIN DUAL TIP RULER LAB (MISCELLANEOUS) ×2 IMPLANT
NDL PERC 18GX7CM (NEEDLE) ×2 IMPLANT
NEEDLE PERC 18GX7CM (NEEDLE) ×4 IMPLANT
NS IRRIG 1000ML POUR BTL (IV SOLUTION) ×16 IMPLANT
PACK AORTA (CUSTOM PROCEDURE TRAY) ×4 IMPLANT
PAD ARMBOARD 7.5X6 YLW CONV (MISCELLANEOUS) ×8 IMPLANT
PAD ELECT DEFIB RADIOL ZOLL (MISCELLANEOUS) ×4 IMPLANT
PATCH TACHOSII LRG 9.5X4.8 (VASCULAR PRODUCTS) IMPLANT
SHEATH PINNACLE 6F 10CM (SHEATH) ×2 IMPLANT
SLEEVE REPOSITIONING LENGTH 30 (MISCELLANEOUS) ×2 IMPLANT
SPONGE GAUZE 4X4 12PLY STER LF (GAUZE/BANDAGES/DRESSINGS) ×2 IMPLANT
SPONGE LAP 4X18 X RAY DECT (DISPOSABLE) ×4 IMPLANT
STOPCOCK 4 WAY LG BORE MALE ST (IV SETS) ×2 IMPLANT
STOPCOCK MORSE 400PSI 3WAY (MISCELLANEOUS) ×8 IMPLANT
SUT ETHIBOND X763 2 0 SH 1 (SUTURE) ×4 IMPLANT
SUT GORETEX CV 4 TH 22 36 (SUTURE) ×4 IMPLANT
SUT GORETEX CV4 TH-18 (SUTURE) ×12 IMPLANT
SUT GORETEX TH-18 36 INCH (SUTURE) ×8 IMPLANT
SUT MNCRL AB 3-0 PS2 18 (SUTURE) ×4 IMPLANT
SUT PROLENE 3 0 SH1 36 (SUTURE) IMPLANT
SUT PROLENE 4 0 RB 1 (SUTURE)
SUT PROLENE 4-0 RB1 .5 CRCL 36 (SUTURE) ×2 IMPLANT
SUT PROLENE 5 0 C 1 36 (SUTURE) ×8 IMPLANT
SUT PROLENE 6 0 C 1 30 (SUTURE) ×8 IMPLANT
SUT SILK  1 MH (SUTURE) ×2
SUT SILK 1 MH (SUTURE) ×2 IMPLANT
SUT SILK 2 0 SH CR/8 (SUTURE) IMPLANT
SUT VIC AB 2-0 CT1 27 (SUTURE)
SUT VIC AB 2-0 CT1 TAPERPNT 27 (SUTURE) ×2 IMPLANT
SUT VIC AB 2-0 CTX 36 (SUTURE) IMPLANT
SUT VIC AB 3-0 SH 8-18 (SUTURE) ×8 IMPLANT
SYR 30ML LL (SYRINGE) ×12 IMPLANT
SYR 50ML LL SCALE MARK (SYRINGE) ×4 IMPLANT
SYR MEDRAD MARK V 150ML (SYRINGE) ×2 IMPLANT
SYRINGE 10CC LL (SYRINGE) ×8 IMPLANT
TAPE CLOTH SURG 4X10 WHT LF (GAUZE/BANDAGES/DRESSINGS) ×2 IMPLANT
TOWEL OR 17X26 10 PK STRL BLUE (TOWEL DISPOSABLE) ×8 IMPLANT
TRANSDUCER W/STOPCOCK (MISCELLANEOUS) ×4 IMPLANT
TRAY FOLEY IC TEMP SENS 14FR (CATHETERS) ×4 IMPLANT
TUBE SUCT INTRACARD DLP 20F (MISCELLANEOUS) IMPLANT
TUBING ART PRESS 72  MALE/FEM (TUBING) ×2
TUBING ART PRESS 72 MALE/FEM (TUBING) IMPLANT
TUBING HIGH PRESSURE 120CM (CONNECTOR) ×4 IMPLANT
VALVE HEART TRANSCATH SZ3 23MM (Prosthesis & Implant Heart) ×2 IMPLANT
WIRE .035 3MM-J 145CM (WIRE) ×2 IMPLANT
WIRE AMPLATZ SS-J .035X180CM (WIRE) ×2 IMPLANT

## 2015-04-06 NOTE — Significant Event (Signed)
Patient arrived to hospital with these ulcers: nonhealing ulcers on right foot (right lateral aspect of foot, tip of right great toe, tip of right 2nd toe). Also came in with stage 2 ulcer on left buttock noted when bathing patient. Placed a new sacral mepilex on it. Patient is aware of these ulcers. Stated he has been with outpatient wound clinic for his foot ulcers. Patient also aware of ulcer on left buttock, stated he put cream on it. RN also noted yeasty areas on inner groin, scrotal areas-ordered Gerhardt's cream for it. Updated Dr. Cornelius Moras on these ulcers during this evening round that these wounds are pre-existing prior to this hospitalization. [name], Charity fundraiser.

## 2015-04-06 NOTE — Op Note (Signed)
CARDIOTHORACIC SURGERY OPERATIVE NOTE  Date of Procedure:  04/06/2015  Preoperative Diagnosis: Severe Aortic Stenosis   Postoperative Diagnosis: Same   Procedure:    Transcatheter Aortic Valve Replacement - Open Right Transfemoral Approach  Edwards Sapien 3 Transcatheter Heart Valve (size 23 mm, model # 9600TFX, serial # [id number])   Co-Surgeons:  Salvatore Decent. Cornelius Moras, MD and Tonny Bollman, MD  Assistants:   Verne Carrow, MD  Anesthesiologist:  Corky Sox, MD  Echocardiographer:  Charlton Haws  Pre-operative Echo Findings:  Severe aortic stenosis  Moderate aortic insufficiency  Normal left ventricular systolic function  Post-operative Echo Findings:  No paravalvular leak  Unchanged left ventricular systolic function      DETAILS OF THE OPERATIVE PROCEDURE  The majority of the procedure is documented separately in a procedure note by Dr. Excell Seltzer.   TRANSFEMORAL ACCESS:   A small incision is made in the right groin immediately over the common femoral artery. The subcutaneous tissues are divided with electrocautery and the anterior surface of the common femoral artery is identified. Sharp dissection is utilized to free up the artery proximally and distally and an appropriate site for arterial puncture is identified.  A pair of CV-4 Gore-tex sutures are place as diamond-shaped purse-strings on the anterior surface of the femoral artery.  The patient is heparinized systemically and ACT verified > 250 seconds.  The common femoral artery is punctured using an 18 gauge needle and a soft J-tipped guidewire is passed into the common iliac artery under fluoroscopic guidance.  A 6 Fr straight diagnostic catheter is placed over the guidewire and the guidewire is removed.  An Amplatz super stiff guidewire is passed through the sheath into the descending thoracic aorta and the introducing diagnostic catheter is removed.  Serial dilators are passed over the guidewire under continuous  fluoroscopic guidance, making certain that each dilator passes easily all of the way into the distal abdominal aorta.  A 14 Fr Commander introducer sheath is passed over the guidewire into the abdominal aorta.  The introducing dilator is removed, the sheath is flushed with heparinized saline, and the sheath is secured to the skin.    FEMORAL SHEATH REMOVAL AND ARTERIAL CLOSURE:  After the completion of successful valve deployment as documented separately by Dr. Excell Seltzer, the femoral artery sheath is removed and the arteriotomy is closed using the previously placed Gore-tex purse-string sutures. Once the repair has been completed protamine was administered to reverse the anticoagulation. The incision is irrigated with saline solution and subsequently closed in multiple layers using absorbable suture.  The skin incision is closed using a subcuticular skin closure.     Salvatore Decent. Cornelius Moras MD 04/06/2015 1:47 PM

## 2015-04-06 NOTE — Progress Notes (Signed)
[facility] BRIEF SICU PROGRESS NOTE  Day of Surgery  S/P Procedure(s) (LRB): TRANSCATHETER AORTIC VALVE REPLACEMENT, TRANSFEMORAL (N/A) TRANSESOPHAGEAL ECHOCARDIOGRAM (TEE) (N/A)   Looks good.  Awake and alert.  Mild pain in right groin NSR w/ stable BP UOP adequate  Plan: Continue current plan  Purcell Nails 04/06/2015 6:41 PM

## 2015-04-06 NOTE — Progress Notes (Signed)
Upon assessment, redness and irritation noted in groin area.  CHG wipes not used in that area prior to surgery.

## 2015-04-06 NOTE — Progress Notes (Signed)
Echocardiogram Echocardiogram Transesophageal has been performed.  [name] 04/06/2015, 2:00 PM

## 2015-04-06 NOTE — Anesthesia Preprocedure Evaluation (Signed)
Anesthesia Evaluation  Patient identified by MRN, date of birth, ID band Patient awake    Reviewed: Allergy & Precautions, NPO status , Patient's Chart, lab work & pertinent test results  History of Anesthesia Complications Negative for: history of anesthetic complications  Airway Mallampati: IV  TM Distance: >3 FB Neck ROM: Full    Dental  (+) Teeth Intact,    Pulmonary shortness of breath, sleep apnea and Continuous Positive Airway Pressure Ventilation ,    breath sounds clear to auscultation       Cardiovascular METS: 3 - Mets hypertension, Pt. on medications and Pt. on home beta blockers + CAD, + Past MI, + Cardiac Stents, + Peripheral Vascular Disease and +CHF  + Valvular Problems/Murmurs AS  Rhythm:Regular + Systolic murmurs    Neuro/Psych neg Seizures PSYCHIATRIC DISORDERS Anxiety Depression TIACVA, No Residual Symptoms    GI/Hepatic Neg liver ROS, GERD  Medicated and Controlled,  Endo/Other  diabetesMorbid obesity  Renal/GU Renal InsufficiencyRenal disease     Musculoskeletal  (+) Arthritis ,   Abdominal   Peds  Hematology   Anesthesia Other Findings   Reproductive/Obstetrics                             Anesthesia Physical Anesthesia Plan  ASA: IV  Anesthesia Plan: General   Post-op Pain Management:    Induction: Intravenous  Airway Management Planned: Oral ETT  Additional Equipment: Arterial line, CVP, PA Cath, TEE and Ultrasound Guidance Line Placement  Intra-op Plan:   Post-operative Plan: Extubation in OR and Possible Post-op intubation/ventilation  Informed Consent: I have reviewed the patients History and Physical, chart, labs and discussed the procedure including the risks, benefits and alternatives for the proposed anesthesia with the patient or authorized representative who has indicated his/her understanding and acceptance.   Dental advisory given  Plan  Discussed with: CRNA and Surgeon  Anesthesia Plan Comments:         Anesthesia Quick Evaluation

## 2015-04-06 NOTE — Interval H&P Note (Signed)
History and Physical Interval Note:  04/06/2015 8:16 AM  [name]  has presented today for surgery, with the diagnosis of SEVERE AS  The various methods of treatment have been discussed with the patient and family. After consideration of risks, benefits and other options for treatment, the patient has consented to  Procedure(s): TRANSCATHETER AORTIC VALVE REPLACEMENT, TRANSFEMORAL (N/A) TRANSESOPHAGEAL ECHOCARDIOGRAM (TEE) (N/A) as a surgical intervention .  The patient's history has been reviewed, patient examined, no change in status, stable for surgery.  I have reviewed the patient's chart and labs.  Questions were answered to the patient's satisfaction.     Tonny Bollman

## 2015-04-06 NOTE — H&P (View-Only) (Signed)
Cardiology Office Note Date:  03/12/2015   ID:  [name], DOB [birth year], MRN [id number]  PCP:  [name], MD  Cardiologist:  Dr Donnie Aho  No chief complaint on file.   History of Present Illness: [name] is a 81 y.o. male who presents for evaluation of symptomatic aortic stenosis. The patient has been followed by Dr. Donnie Aho. He has a complex medical history. This includes history of staphylococcal pericarditis. He underwent a pericardial window in 2007. The patient has coronary artery disease and he has undergone PCI in the past. His most recent cardiac catheterization and PCI was performed in 2010 when he underwent stenting and balloon angioplasty of the left circumflex/obtuse marginal bifurcation. He has been followed for aortic stenosis which has been moderate by serial echocardiography. However, his transvalvular gradients have been increasing and clinically he is having more frequent exacerbations of congestive heart failure. He is referred for consideration of TAVR.  The patient has been hospitalized twice with heart failure in the past year. His most recent hospitalization was prolonged and he was admitted June 18 and discharged July 7. He then spent over 3 weeks at [facility] nursing home. He is now back to independent living and the patient is here today with a friend.   During his most recent hospitalization, he was noted to have staph bacteremia and was treated with 4 weeks of antibiotics. Transthoracic echocardiogram did not show any evidence of cardiac vegetation. He was felt to be too sick to undergo transesophageal echocardiogram. The patient had hypoxic and hypercarbic respiratory failure and required mechanical ventilation. He ultimately improved with supportive care. He was treated with one month of IV vancomycin after a PICC line was placed. His cardiac enzymes were elevated in the setting of his acute illness.  He is now feeling better than he has felt in some  time. He continues to be limited by shortness of breath with activity. He denies chest pain or pressure. He does admit to leg swelling but denies orthopnea or PND. No lightheadedness or syncope.  The patient's most recent echocardiogram from 01/09/2015 demonstrated normal LV systolic function with an ejection fraction of 55-60% and moderate aortic stenosis with a peak velocity of 386 cm/s and mean gradient of 32 mmHg. The mitral annulus was noted to be calcified and there was left atrial dilatation present. Transaortic valve gradients had increased from previous studies.  Past Medical History  Diagnosis Date  . Gout, unspecified     severe dz, "treatment done that last 15 years"  . Obesity (BMI 30-39.9)   . Restless leg syndrome   . History of retinal detachment 1994  . Hypertensive heart disease   . CAD (coronary artery disease), native coronary artery   . History of pericarditis 2007    MSSA, s/p pericardial window  . GERD   . Osteoarthritis   . Sleep apnea in adult     CPAP qhs  . TIA (transient ischemic attack) 2005  . BPH (benign prostatic hypertrophy)     "minor"  . Lumbar spinal stenosis   . Carotid artery occlusion   . Unspecified venous (peripheral) insufficiency   . Stroke May 2003  . Anxiety   . Depression   . Aortic stenosis     ECHO 05/29/14 moderate AS Mean gradient 21 mm EF 55% ECHO 07/09/13  Mean gradient 12 mm Mild to moderate AS EF 50%    . CAD (coronary artery disease)     02/11/09  Xience stent to circumflex 2.5  x 18 mm postdilated to 2.75 mm  Cath sept 2011  normal Left main, 20 % stenosis proximal LAD, 40% stenosis mid LAD, 70% stenosis proximal Diag 1, 90% stenosis prox CFX, 90% first OM, small and nondominant RCA;   . Carotid artery disease     Prior TIA 2006 with left CEA by Dr. Arbie Cookey Recurrent TIA in April 2011   . Type 2 diabetes mellitus with vascular disease   . Lumbar disc disease   . CKD (chronic kidney disease), stage III 02/08/2014  . CHF (congestive  heart failure)     Past Surgical History  Procedure Laterality Date  . Pericardial window  2007  . Left arm  2008    shoulder  . Right arm  1970's    shoulder  . Cataract extraction      Left side x's 2 and right  . Retinal detachment surgery      left side  . Carotid endarterectomy Left 12-06-05    cea  . Coronary angioplasty  01/2009    x 1 stent  . Colonoscopy    . Carpal tunnel release Bilateral   . Back surgery  2013    removed bone spurs  . Inguinal hernia repair Left 10/01/2013    Procedure: HERNIA REPAIR INGUINAL ADULT;  Surgeon: Axel Filler, MD;  Location: [facility];  Service: General;  Laterality: Left;  . Insertion of mesh Left 10/01/2013    Procedure: INSERTION OF MESH;  Surgeon: Axel Filler, MD;  Location: [facility];  Service: General;  Laterality: Left;  . Finger amputation  May 2015    JUST TO FIRST JOINT RIGHT HAND  LAST FINGER ( Right 5th finger)  . Peripheral vascular catheterization N/A 01/08/2015    Procedure: Abdominal Aortogram;  Surgeon: Chuck Hint, MD;  Location: [facility] INVASIVE CV LAB;  Service: Cardiovascular;  Laterality: N/A;    Current Outpatient Prescriptions  Medication Sig Dispense Refill  . acetaminophen (TYLENOL) 325 MG tablet Take 2 tablets (650 mg total) by mouth every 6 (six) hours as needed for mild pain or moderate pain. (Patient taking differently: Take 325 mg by mouth every 6 (six) hours as needed for mild pain or moderate pain. )    . allopurinol (ZYLOPRIM) 300 MG tablet Take 1 tablet (300 mg total) by mouth every morning. 90 tablet 1  . aspirin EC 81 MG tablet Take 81 mg by mouth daily.    Marland Kitchen buPROPion (WELLBUTRIN XL) 150 MG 24 hr tablet Take 1 tablet (150 mg total) by mouth every morning. 90 tablet 1  . cholecalciferol (VITAMIN D) 1000 UNITS tablet Take 1,000 Units by mouth daily.    . clopidogrel (PLAVIX) 75 MG tablet Take 1 tablet (75 mg total) by mouth daily. 90 tablet 3  . docusate sodium 100 MG CAPS Take 100 mg by mouth 2  (two) times daily. 10 capsule 0  . Ferrous Gluconate 256 (28 FE) MG TABS Take 256 mg by mouth daily.    . furosemide (LASIX) 40 MG tablet Take 1 tablet (40 mg total) by mouth daily. (Patient taking differently: Take 40 mg by mouth 2 (two) times daily. )    . Magnesium 250 MG TABS Take 250 mg by mouth daily.     . metoprolol succinate (TOPROL-XL) 50 MG 24 hr tablet Take 1 tablet (50 mg total) by mouth daily. Take with or immediately following a meal. 90 tablet 3  . mirabegron ER (MYRBETRIQ) 50 MG TB24 tablet Take 1 tablet (50 mg  total) by mouth daily. 90 tablet 1  . mirtazapine (REMERON) 15 MG tablet Take 1 tablet (15 mg total) by mouth at bedtime. 30 tablet 11  . pantoprazole (PROTONIX) 40 MG tablet Take 1 tablet (40 mg total) by mouth 2 (two) times daily. 90 tablet 1  . pregabalin (LYRICA) 200 MG capsule Take 1 capsule (200 mg total) by mouth at bedtime. 90 capsule 1  . rOPINIRole (REQUIP) 4 MG tablet Take 1 tablet (4 mg total) by mouth at bedtime. 90 tablet 1  . tamsulosin (FLOMAX) 0.4 MG CAPS capsule Take 1 capsule (0.4 mg total) by mouth daily. 90 capsule 3   No current facility-administered medications for this visit.   Allergies:   Statins   Social History:  The patient  reports that he has never smoked. He has never used smokeless tobacco. He reports that he does not drink alcohol or use illicit drugs.   Family History:  The patient's  family history includes Breast cancer in his sister; Cancer in his brother and sister; Diabetes in his daughter, father, and son; Heart attack in his mother; Heart disease in his father and mother; Hypertension in his son; Lung cancer in his brother; Prostate cancer in his brother.   ROS:  Please see the history of present illness.  Otherwise, review of systems is positive for orthopnea, PND, anxiety, and gait instability.  All other systems are reviewed and negative.   PHYSICAL EXAM: VS:  BP 136/66 mmHg  Pulse 88  Ht 5\' 10"  (1.778 m)  Wt 231 lb 6.4 oz  (104.962 kg)  BMI 33.20 kg/m2  SpO2 95% , BMI Body mass index is 33.2 kg/(m^2). GEN: Pleasant elderly male, in no acute distress HEENT: normal Neck: no JVD, no masses. Carotid upstrokes are delayed with bilateral bruits Cardiac: Regular rate and rhythm with a late peaking harsh systolic murmur best heard at the right upper sternal border with markedly diminished A2 component                Respiratory:  clear to auscultation bilaterally, normal work of breathing GI: soft, nontender, nondistended, + BS MS: no deformity or atrophy Ext: Trace pretibial edema Skin: There is mild erythema of the lower legs. There is a right leg dressing in place over the shin Neuro:  Strength and sensation are intact Psych: euthymic mood, full affect  EKG:  EKG is not ordered today.  Recent Labs: 07/13/2014: Pro B Natriuretic peptide (BNP) 814.6* 07/14/2014: TSH 3.608 01/10/2015: B Natriuretic Peptide 285.0* 01/19/2015: ALT 34 01/23/2015: Magnesium 3.2* 01/25/2015: Hemoglobin 14.4; Platelets 161 01/27/2015: BUN 16; Creatinine, Ser 0.86; Potassium 4.0; Sodium 141   Lipid Panel     Component Value Date/Time   CHOL 190 07/15/2014 0325   TRIG 312* 01/16/2015 1157   TRIG 154 04/13/2010   HDL 32* 07/15/2014 0325   CHOLHDL 5.9 07/15/2014 0325   VLDL 30 07/15/2014 0325   LDLCALC 128* 07/15/2014 0325   LDLDIRECT 134.4 02/06/2013 1126      Wt Readings from Last 3 Encounters:  03/12/15 231 lb 6.4 oz (104.962 kg)  03/04/15 237 lb (107.502 kg)  02/26/15 233 lb (105.688 kg)     Cardiac Studies Reviewed: 2-D echocardiogram 01/09/2015: Study Conclusions  - Left ventricle: The cavity size was normal. There was mild concentric hypertrophy. Systolic function was normal. The estimated ejection fraction was in the range of 55% to 60%. Although no diagnostic regional wall motion abnormality was identified, this possibility cannot be completely excluded on the  basis of this study. Features are consistent  with a pseudonormal left ventricular filling pattern, with concomitant abnormal relaxation and increased filling pressure (grade 2 diastolic dysfunction). - Aortic valve: Cusp separation was reduced. There was moderate stenosis. Peak velocity (S): 386 cm/s. Mean gradient (S): 32 mm Hg. - Mitral valve: Moderately calcified annulus. - Left atrium: The atrium was mildly to moderately dilated.  Impressions:  - When compared to prior echocardiogram, aortic stenosis has advanced.  Procedure: AV Replacement  Risk of Mortality: 7.298%  Morbidity or Mortality: 36.87%  Long Length of Stay: 23.762%  Short Length of Stay: 11.738%  Permanent Stroke: 3.88%  Prolonged Ventilation: 25.4%  DSW Infection: 0.691%  Renal Failure: 15.406%  Reoperation: 12.62%   ASSESSMENT AND PLAN: 81 year old gentleman with multiple medical problems outlined above, who has at least moderate and probably severe stage D symptomatic aortic stenosis. He has had 2 episodes of acute diastolic heart failure and respiratory failure over the past year requiring mechanical ventilation. While this transvalvular gradients are in the moderate range, his physical exam is more suggestive of severe aortic stenosis. I have personally reviewed his echo images and he does have calcification and at least moderate restriction of all 3 aortic valve leaflets.  I have reviewed the natural history of aortic stenosis with the patient today. We have discussed the limitations of medical therapy and the poor prognosis associated with symptomatic aortic stenosis. We have also reviewed potential treatment options, including palliative medical therapy, conventional surgical aortic valve replacement, and transcatheter aortic valve replacement. We discussed treatment options in the context of this patient's specific comorbid medical conditions.   Because of his multiple comorbid conditions and advanced age, if he is confirmed to have severe  aortic stenosis, TAVR would be an attractive treatment option. His STS-PROM is at least moderately elevated and I think his risk is higher than that predicted by the risk calculator as recurrent hypercarbic respiratory failure is not well-accounted for. I have recommended right and left heart catheterization for further evaluation of the patient's aortic stenosis as well as his known coronary artery disease. Will directly measure transaortic gradients at the time of cardiac catheterization. I have reviewed the risks, indications, and alternatives to cardiac catheterization  with the patient. Risks include but are not limited to bleeding, infection, vascular injury, stroke, myocardial infection, arrhythmia, kidney injury, radiation-related injury in the case of prolonged fluoroscopy use, emergency cardiac surgery, and death. The patient understands the risks of serious complication is low (<1%). Further plans and multidisciplinary valve team evaluation pending cardiac catheterization results.  Current medicines are reviewed with the patient today.  The patient does not have concerns regarding medicines.  Labs/ tests ordered today include:  No orders of the defined types were placed in this encounter.   Disposition:   FU pending evaluation outlined above  Signed, Tonny Bollman, MD  03/12/2015 3:07 PM    [facility] [address] [place], [place], [place]  [address] Phone: [phone]; Fax: [phone]

## 2015-04-06 NOTE — Anesthesia Postprocedure Evaluation (Signed)
  Anesthesia Post-op Note  Patient: [name]  Procedure(s) Performed: Procedure(s): TRANSCATHETER AORTIC VALVE REPLACEMENT, TRANSFEMORAL (N/A) TRANSESOPHAGEAL ECHOCARDIOGRAM (TEE) (N/A)  Patient Location: ICU  Anesthesia Type:General  Level of Consciousness: awake  Airway and Oxygen Therapy: Patient Spontanous Breathing and Patient connected to nasal cannula oxygen  Post-op Pain: none  Post-op Assessment: Post-op Vital signs reviewed, Patient's Cardiovascular Status Stable, Respiratory Function Stable, Patent Airway, No signs of Nausea or vomiting and Pain level controlled              Post-op Vital Signs: Reviewed and stable  Last Vitals:  Filed Vitals:   04/06/15 1501  BP: 123/46  Pulse: 61  Temp: 35.5 C  Resp: 18    Complications: No apparent anesthesia complications

## 2015-04-06 NOTE — Transfer of Care (Signed)
Immediate Anesthesia Transfer of Care Note  Patient: [name]  Procedure(s) Performed: Procedure(s): TRANSCATHETER AORTIC VALVE REPLACEMENT, TRANSFEMORAL (N/A) TRANSESOPHAGEAL ECHOCARDIOGRAM (TEE) (N/A)  Patient Location: SICU  Anesthesia Type:General  Level of Consciousness: awake, alert , oriented and patient cooperative  Airway & Oxygen Therapy: Patient Spontanous Breathing and Patient connected to face mask oxygen  Post-op Assessment: Report given to RN, Post -op Vital signs reviewed and stable and Patient moving all extremities X 4  Post vital signs: Reviewed and stable  Last Vitals:  Filed Vitals:   04/06/15 0749  BP:   Pulse:   Temp: 36.1 C  Resp: 20    Complications: No apparent anesthesia complications

## 2015-04-06 NOTE — OR Nursing (Signed)
Late entry for delay code documentation. 

## 2015-04-06 NOTE — Progress Notes (Signed)
Clarified with Dr. Excell Seltzer about IV hydration order.  Per Dr. Earmon Phoenix order, will start NS at 300 cc for first hour and then 100 cc per hour.

## 2015-04-06 NOTE — Op Note (Signed)
[facility]  TAVR OPERATIVE NOTE Date of Procedure:  04/06/2015  Preoperative Diagnosis: Severe Aortic Stenosis   Postoperative Diagnosis: Same   Procedure:   Transcatheter Aortic Valve Replacement - Transfemoral Approach  Edwards Sapien 3 THV (size 23 mm, model # 9600TFX, serial # [id number])   Co-Surgeons:  Salvatore Decent. Cornelius Moras, MD and Tonny Bollman, MD  Assistants:   Verne Carrow, MD  Anesthesiologist:  Corky Sox, MD  Echocardiographer:  Charlton Haws, MD  Pre-operative Echo Findings:  Severe aortic stenosis  Normal left ventricular systolic function  Post-operative Echo Findings:  No paravalvular leak  Normal left ventricular systolic function  BRIEF CLINICAL NOTE AND INDICATIONS FOR SURGERY The patient is an 81 year old gentleman with a history of obesity, type 2 DM, stage 3 chronic kidney disease, OSA, known coronary artery disease s/p MI x 2 in the past and PCI with stenting of the OM1 in 2010, and aortic stenosis. He has done well until the past year and has been hospitalized 3 times since Nov 2015 with acute on chronic diastolic heart failure. He had a prolonged hospitalization from 01/09/2015 to 01/28/2015 with another acute exacerbation of chronic diastolic congestive heart failure complicated by hypoxemic respiratory failure requiring a fairly prolonged course of mechanical ventilation.Transthoracic echocardiogram performed at that time demonstrated the presence of moderate to severe aortic stenosis with peak velocity across the aortic valve measured 3.9 m/s corresponding to mean transvalvular gradient estimated at 32 mmHg. Left ventricular systolic function was preserved with ejection fraction estimated at 55-60%. During that hospitalization he developed febrile illness associated with coag-negative Staphylococcus bacteremia for which he was treated with a 4 week course of intravenous antibiotics. The patient never had a transesophageal  echocardiogram performed, although transthoracic echocardiograms did not reveal any findings to suggest the presence of bacterial endocarditis. Blood cultures cleared on anabiotic therapy and subsequent follow-up blood cultures performed after completion of treatment were negative. The patient gradually recovered from his prolonged hospitalization and spent 3 weeks at a skilled nursing facility in July before returning to independent living. He underwent cardiac cath on 03/15/2015 showing 40% ostial and 60% mid RCA stenoses. The LAD was heavily calcified but had non-obstructive disease. There was 50% ostial LCX stenosis with continued patency of the stented segment of the OM1. Catheterization confirmed the presence of severe aortic stenosis with mean transvalvular gradient measured 38 mmHg corresponding to aortic valve area calculated 0.83 cm.  During the course of the patient's preoperative work up they have been evaluated comprehensively by a multidisciplinary team of specialists coordinated through the Multidisciplinary Heart Valve Clinic in the [facility].  They have been demonstrated to suffer from symptomatic severe aortic stenosis as noted above. The patient has been counseled extensively as to the relative risks and benefits of all options for the treatment of severe aortic stenosis including long term medical therapy, conventional surgery for aortic valve replacement, and transcatheter aortic valve replacement.  The patient has been independently evaluated by two cardiac surgeons including Dr Cornelius Moras and Dr. Laneta Simmers, and they are felt to be at high risk for conventional surgical aortic valve replacement based upon a predicted risk of mortality using the Society of Thoracic Surgeons risk calculator of 8.7%. Both surgeons indicated the patient would be a poor candidate for conventional surgery (predicted risk of mortality >15% and/or predicted risk of permanent morbidity >50%)  because of comorbidities including CKD III, chronic diastolic heart failure, chronic respiratory failure.   Based upon review of  all of the patient's preoperative diagnostic tests they are felt to be candidate for transcatheter aortic valve replacement using the transfemoral approach as an alternative to high risk conventional surgery.    Following the decision to proceed with transcatheter aortic valve replacement, a discussion has been held regarding what types of management strategies would be attempted intraoperatively in the event of life-threatening complications, including whether or not the patient would be considered a candidate for the use of cardiopulmonary bypass and/or conversion to open sternotomy for attempted surgical intervention.  The patient has been advised of a variety of complications that might develop peculiar to this approach including but not limited to risks of death, stroke, paravalvular leak, aortic dissection or other major vascular complications, aortic annulus rupture, device embolization, cardiac rupture or perforation, acute myocardial infarction, arrhythmia, heart block or bradycardia requiring permanent pacemaker placement, congestive heart failure, respiratory failure, renal failure, pneumonia, infection, other late complications related to structural valve deterioration or migration, or other complications that might ultimately cause a temporary or permanent loss of functional independence or other long term morbidity.  The patient provides full informed consent for the procedure as described and all questions were answered preoperatively.  DETAILS OF THE OPERATIVE PROCEDURE PREPARATION:   The patient is brought to the operating room on the above mentioned date and central monitoring was established by the anesthesia team including placement of Swan-Ganz catheter and radial arterial line. The patient is placed in the supine position on the operating table.  Intravenous  antibiotics are administered. General endotracheal anesthesia is induced uneventfully. A Foley catheter is placed.  Baseline transesophageal echocardiogram was performed. The patient's chest, abdomen, both groins, and both lower extremities are prepared and draped in a sterile manner. A time out procedure is performed.  PERIPHERAL ACCESS:   Using the modified Seldinger technique, femoral arterial and venous access was obtained with placement of 6 Fr sheaths on the left side.  A pigtail diagnostic catheter was passed through the left femoral arterial sheath under fluoroscopic guidance into the aortic root.  A temporary transvenous pacemaker catheter was passed through the left femoral venous sheath under fluoroscopic guidance into the right ventricle.  The pacemaker was tested to ensure stable lead placement and pacemaker capture. Aortic root angiography was performed in order to determine the optimal angiographic angle for valve deployment.  TRANSFEMORAL ACCESS:  A right femoral arterial cutdown was performed by Dr Cornelius Moras. Please see his separate operative note for details. The patient was heparinized systemically and ACT verified > 250 seconds.    A 14 Fr transfemoral E-sheath was introduced into the right femoral artery after progressively dilating over an Amplatz superstiff wire. An AL-2 catheter was used to direct a straight-tip exchange length wire across the native aortic valve into the left ventricle. This was exchanged out for a pigtail catheter and position was confirmed in the LV apex. Simultaneous LV and Ao pressures were recorded.  The pigtail catheter was then exchanged for an Amplatz Extra-stiff wire in the LV apex. At that point, BAV was performed using a 23 mm valvuloplasty balloon.  A contrast injection was performed with a 23 mm balloon inflated in order to size the annulus. There appeared to be a complete seal with a 23 mm balloon inflated. Once optimal position was achieved, BAV was done  under rapid ventricular pacing at 180 bpm. The patient recovered well hemodynamically.   TRANSCATHETER HEART VALVE DEPLOYMENT:  An Edwards Sapien 3 THV (size 23 mm) was prepared and crimped per  manufacturer's guidelines, and the proper orientation of the valve is confirmed on the [employer] delivery system. The valve was advanced through the introducer sheath using normal technique until in an appropriate position in the abdominal aorta beyond the sheath tip. The balloon was then retracted and using the fine-tuning wheel was centered on the valve. The valve was then advanced across the aortic arch using appropriate flexion of the catheter. The valve was carefully positioned across the aortic valve annulus. The Commander catheter was retracted using normal technique. Once final position of the valve has been confirmed by angiographic assessment, the valve is deployed while temporarily holding ventilation and during rapid ventricular pacing to maintain systolic blood pressure < 50 mmHg and pulse pressure < 10 mmHg. The balloon inflation is held for >3 seconds after reaching full deployment volume. Once the balloon has fully deflated the balloon is retracted into the ascending aorta and valve function is assessed using TEE. There is felt to be no paravalvular leak and no central aortic insufficiency.  The patient's hemodynamic recovery following valve deployment is good.    PROCEDURE COMPLETION:  The sheath was then removed and arteriotomy repaired by Dr Cornelius Moras. Please see his separate report for details. Distal abdominal aortography was performed to evaluate for any arterial injury related to the procedure. There was no evidence dissection, perforation, or other vascular injury in the abdominal aorta, iliac artery, or femoral artery.  Protamine was administered once femoral arterial repair was complete. The temporary pacemaker, pigtail catheters and femoral sheaths were removed with manual pressure used for  hemostasis.   The patient tolerated the procedure well and is transported to the surgical intensive care in stable condition. There were no immediate intraoperative complications. All sponge instrument and needle counts are verified correct at completion of the operation.   The patient received a total of 76 mL of intravenous contrast during the procedure.  Tonny Bollman MD 04/06/2015 1:19 PM

## 2015-04-06 NOTE — Care Management Note (Signed)
Case Management Note  Patient Details  Name: [name] MRN: [id number] Date of Birth: 02-Jan-1933  Subjective/Objective:     Per previous CM note, lives at home alone and has [facility] for wound treatment.  May need SNF on discharge.  Will continue to follow for progression and plan.                Action/Plan:   Expected Discharge Date:                  Expected Discharge Plan:  Skilled Nursing Facility  In-House Referral:  Clinical Social Work  Discharge planning Services     Post Acute Care Choice:    Choice offered to:     DME Arranged:    DME Agency:     HH Arranged:    HH Agency:     Status of Service:  In process, will continue to follow  Medicare Important Message Given:    Date Medicare IM Given:    Medicare IM give by:    Date Additional Medicare IM Given:    Additional Medicare Important Message give by:     If discussed at Long Length of Stay Meetings, dates discussed:    Additional Comments:  Vangie Bicker, RN 04/06/2015, 3:00 PM

## 2015-04-06 NOTE — Care Management (Signed)
This patient is active with [facility]. °I will follow for discharge needs. ° °Thank you. °Mary Yonjof RN BSN °[facility] °[phone] °

## 2015-04-06 NOTE — Anesthesia Procedure Notes (Signed)
Procedure Name: Intubation Date/Time: 04/06/2015 11:18 AM Performed by: Bishop Limbo Pre-anesthesia Checklist: Patient identified, Emergency Drugs available, Suction available, Patient being monitored and Timeout performed Patient Re-evaluated:Patient Re-evaluated prior to inductionOxygen Delivery Method: Circle system utilized Preoxygenation: Pre-oxygenation with 100% oxygen Intubation Type: IV induction Ventilation: Two handed mask ventilation required and Oral airway inserted - appropriate to patient size Laryngoscope Size: Mac and 4 Grade View: Grade I Tube type: Subglottic suction tube Tube size: 7.5 mm Number of attempts: 1 Airway Equipment and Method: Stylet Placement Confirmation: ETT inserted through vocal cords under direct vision,  positive ETCO2,  CO2 detector and breath sounds checked- equal and bilateral Secured at: 22 cm Tube secured with: Tape Dental Injury: Teeth and Oropharynx as per pre-operative assessment

## 2015-04-07 ENCOUNTER — Encounter (HOSPITAL_COMMUNITY): Payer: Self-pay | Admitting: Cardiovascular Disease

## 2015-04-07 ENCOUNTER — Inpatient Hospital Stay (HOSPITAL_COMMUNITY): Payer: Medicare Other

## 2015-04-07 ENCOUNTER — Encounter: Payer: Medicare Other | Admitting: Surgery

## 2015-04-07 DIAGNOSIS — I350 Nonrheumatic aortic (valve) stenosis: Principal | ICD-10-CM

## 2015-04-07 DIAGNOSIS — I361 Nonrheumatic tricuspid (valve) insufficiency: Secondary | ICD-10-CM

## 2015-04-07 DIAGNOSIS — Z954 Presence of other heart-valve replacement: Secondary | ICD-10-CM

## 2015-04-07 LAB — GLUCOSE, CAPILLARY
GLUCOSE-CAPILLARY: 114 mg/dL — AB (ref 65–99)
GLUCOSE-CAPILLARY: 102 mg/dL — AB (ref 65–99)
Glucose-Capillary: 133 mg/dL — ABNORMAL HIGH (ref 65–99)
Glucose-Capillary: 116 mg/dL — ABNORMAL HIGH (ref 65–99)

## 2015-04-07 LAB — BASIC METABOLIC PANEL
ANION GAP: 5 (ref 5–15)
BUN: 17 mg/dL (ref 6–20)
CO2: 29 mmol/L (ref 22–32)
Calcium: 7.9 mg/dL — ABNORMAL LOW (ref 8.9–10.3)
Chloride: 105 mmol/L (ref 101–111)
Creatinine, Ser: 1.36 mg/dL — ABNORMAL HIGH (ref 0.61–1.24)
GFR calc Af Amer: 55 mL/min — ABNORMAL LOW (ref >60)
GFR, EST NON AFRICAN AMERICAN: 48 mL/min — AB (ref >60)
GLUCOSE: 117 mg/dL — AB (ref 65–99)
POTASSIUM: 4.7 mmol/L (ref 3.5–5.1)
Sodium: 139 mmol/L (ref 135–145)

## 2015-04-07 LAB — CBC
HEMATOCRIT: 39.8 % (ref 39.0–52.0)
Hemoglobin: 12.2 g/dL — ABNORMAL LOW (ref 13.0–17.0)
MCH: 29.5 pg (ref 26.0–34.0)
MCHC: 30.7 g/dL (ref 30.0–36.0)
MCV: 96.1 fL (ref 78.0–100.0)
PLATELETS: 93 10*3/uL — AB (ref 150–400)
RBC: 4.14 MIL/uL — AB (ref 4.22–5.81)
RDW: 15.9 % — ABNORMAL HIGH (ref 11.5–15.5)
WBC: 6.3 10*3/uL (ref 4.0–10.5)

## 2015-04-07 LAB — MAGNESIUM: Magnesium: 1.9 mg/dL (ref 1.7–2.4)

## 2015-04-07 MED ORDER — NITROGLYCERIN 0.4 MG SL SUBL
0.4000 mg | SUBLINGUAL_TABLET | SUBLINGUAL | Status: DC | PRN
  Administered 2015-04-07 (×2): 0.4 mg via SUBLINGUAL

## 2015-04-07 MED ORDER — NITROGLYCERIN 0.4 MG SL SUBL
SUBLINGUAL_TABLET | SUBLINGUAL | Status: AC
  Filled 2015-04-07: qty 3

## 2015-04-07 MED FILL — Heparin Sodium (Porcine) Inj 1000 Unit/ML: INTRAMUSCULAR | Qty: 30 | Status: AC

## 2015-04-07 MED FILL — Potassium Chloride Inj 2 mEq/ML: INTRAVENOUS | Qty: 40 | Status: AC

## 2015-04-07 MED FILL — Magnesium Sulfate Inj 50%: INTRAMUSCULAR | Qty: 10 | Status: AC

## 2015-04-07 NOTE — Clinical Documentation Improvement (Signed)
Cardiothoracic  Can the diagnosis of pressure ulcer be further specified?   Document if pressure ulcer with stage is Present on Admission   Document Site with laterality - Elbow, Back (upper/lower), Sacral, Hip, Buttock, Ankle, Heel, Head, Other (Specify)  Pressure Ulcer Stage - Stage1, Stage 2, Stage 3, Stage 4, Unstageable, Unspecified, Unable to Clinically Determine  Other  Clinically Undetermined  Supporting Information:(As per notes) "Chronic pressure ulcer"  Please exercise your independent, professional judgment when responding. A specific answer is not anticipated or expected.  Thank You, Nevin Bloodgood, RN, BSN, CCDS,Clinical Documentation Specialist:  [phone]  [phone]=[employer]- Health Information Management

## 2015-04-07 NOTE — Evaluation (Signed)
Physical Therapy Evaluation Patient Details Name: [name] MRN: [id number] DOB: 1932-07-28 Today's Date: 04/07/2015   History of Present Illness  Pt admitted for TAVR for mgmt of severe AS with PMH of restless leg syndrome, RA, CKD III, DMII, CAD, OSA, HTN  Clinical Impression  Pt admitted with above diagnosis. Pt currently with functional limitations due to the deficits listed below (see PT Problem List). Pt ambulated 150' with 1 seated rest break and min A with RW. Pt desat to 78% on RA with ambulation, returned to 93% on 2L O2 after session.  Pt will benefit from skilled PT to increase their independence and safety with mobility to allow discharge to the venue listed below.       Follow Up Recommendations Home health PT;Supervision - Intermittent    Equipment Recommendations  None recommended by PT    Recommendations for Other Services OT consult     Precautions / Restrictions Precautions Precautions: Fall Restrictions Weight Bearing Restrictions: No      Mobility  Bed Mobility               General bed mobility comments: patient in recliner upon arrival  Transfers Overall transfer level: Needs assistance Equipment used: Rolling walker (2 wheeled) Transfers: Sit to/from Stand Sit to Stand: Min assist         General transfer comment: min A for power up  Ambulation/Gait Ambulation/Gait assistance: Min assist Ambulation Distance (Feet): 150 Feet (40, 100) Assistive device: Rolling walker (2 wheeled) Gait Pattern/deviations: Step-through pattern;Wide base of support;Trunk flexed Gait velocity: WFL Gait velocity interpretation: at or above normal speed for age/gender General Gait Details: pt ambulates with fast pace and occasionally loses control with changes in direction, cues to take time since first time up. Seated rest break taken after first 51' for vitals and pt SOB. O2 sats 78% on RA, improved to 91% before ambualting again. 78% after second  ambulation as well, 2L O2 reapplied and O2 sats back up to 93%.   Stairs            Wheelchair Mobility    Modified Rankin (Stroke Patients Only)       Balance Overall balance assessment: Needs assistance Sitting-balance support: No upper extremity supported Sitting balance-Leahy Scale: Good     Standing balance support: Bilateral upper extremity supported Standing balance-Leahy Scale: Poor Standing balance comment: pt with generalized weakness and unsteadiness today (spent yesterday in bed), needed min A and UE support for safe standing                             Pertinent Vitals/Pain Pain Assessment: Faces Faces Pain Scale: Hurts little more Pain Location: right groin Pain Intervention(s): Monitored during session  See ambulation section    Home Living Family/patient expects to be discharged to:: Private residence Living Arrangements: Alone Available Help at Discharge: Family;Available PRN/intermittently Type of Home: House Home Access: Ramped entrance     Home Layout: One level Home Equipment: Walker - 2 wheels;Cane - single point;Bedside commode Additional Comments: pt's son lives nearby and checks on him most days, daughter does his shopping. He still drives but not far    Prior Function Level of Independence: Independent               Hand Dominance   Dominant Hand: Right    Extremity/Trunk Assessment   Upper Extremity Assessment: Overall WFL for tasks assessed  Lower Extremity Assessment: Generalized weakness;RLE deficits/detail;LLE deficits/detail RLE Deficits / Details: pt with restless leg syndrome, noticed tremor throughout ambulation, with increased distance, increased bilateral knee flexion and fatigue, indicative of generalized weakness LLE Deficits / Details: limited ankle ROM due to arthritis  Cervical / Trunk Assessment: Normal  Communication   Communication: No difficulties  Cognition Arousal/Alertness:  Awake/alert Behavior During Therapy: WFL for tasks assessed/performed;Impulsive Overall Cognitive Status: Within Functional Limits for tasks assessed                      General Comments      Exercises        Assessment/Plan    PT Assessment Patient needs continued PT services  PT Diagnosis Abnormality of gait;Difficulty walking;Generalized weakness;Acute pain   PT Problem List Decreased strength;Decreased activity tolerance;Decreased balance;Decreased mobility;Decreased knowledge of use of DME;Decreased knowledge of precautions;Pain;Cardiopulmonary status limiting activity  PT Treatment Interventions DME instruction;Gait training;Functional mobility training;Therapeutic activities;Therapeutic exercise;Balance training;Patient/family education   PT Goals (Current goals can be found in the Care Plan section) Acute Rehab PT Goals Patient Stated Goal: go home PT Goal Formulation: With patient Time For Goal Achievement: 04/21/15 Potential to Achieve Goals: Good    Frequency Min 3X/week   Barriers to discharge Decreased caregiver support lives alone but said that daughter could come stay with him first few days 24/7    Co-evaluation               End of Session Equipment Utilized During Treatment: Gait belt Activity Tolerance: Patient tolerated treatment well Patient left: in chair;with call bell/phone within reach Nurse Communication: Mobility status         Time: 1610-9604 PT Time Calculation (min) (ACUTE ONLY): 19 min   Charges:   PT Evaluation $Initial PT Evaluation Tier I: 1 Procedure     PT G Codes:      Lyanne Co, PT  Acute Rehab Services  [phone]   Lyanne Co 04/07/2015, 11:47 AM

## 2015-04-07 NOTE — Progress Notes (Addendum)
Pt c/o sudden onset midsternal CP at 2245  while lying in bed.  Denies radiation of pain, nausea, vomiting or diaphoresis. 12 lead EKG obtained  Indicating NSR rate 85 with Twave abnormality , consider lateral ischemia.  GIven NTG SL x 2 with resolution of chest pain.    VS:144/42, 113/41 after NTG x 2.  SR 85  RR 24 with O2 sats 94% on 3 l [place].  Awaiting  return page from Dr Ace Gins. [phone]  Repage to Dr Ace Gins  [phone]  Relayed above info to Dr Ace Gins.  No new orders received. Report given to Herbert Seta, RN 2 S.  Will continue to monitor pt who is CP free at this time.

## 2015-04-07 NOTE — Progress Notes (Signed)
  Echocardiogram 2D Echocardiogram has been performed.  [name] 04/07/2015, 4:01 PM

## 2015-04-07 NOTE — Progress Notes (Signed)
    Subjective:  No chest pain or shortness of breath. Feels well this morning.  Objective:  Vital Signs in the last 24 hours: Temp:  [95.7 F (35.4 C)-99.4 F (37.4 C)] 97.5 F (36.4 C) (09/14 1216) Pulse Rate:  [57-87] 65 (09/14 1000) Resp:  [18-30] 19 (09/14 1000) BP: (96-154)/(32-56) 124/41 mmHg (09/14 1000) SpO2:  [90 %-99 %] 97 % (09/14 1000) Arterial Line BP: (121-153)/(23-59) 141/49 mmHg (09/13 1622) Weight:  [241 lb 14.4 oz (109.725 kg)] 241 lb 14.4 oz (109.725 kg) (09/14 0500)  Intake/Output from previous day: 09/13 0701 - 09/14 0700 In: 1757.7 [P.O.:180; I.V.:1477.7; IV Piggyback:100] Out: 1855 [Urine:1705; Blood:150]  Physical Exam: Pt is alert and oriented, NAD HEENT: normal Neck: JVP - normal Lungs: CTA bilaterally CV: RRR with grade 2/6 systolic murmur left lower sternal border Abd: soft, NT, Positive BS, no hepatomegaly Ext: Mild pretibial edema bilaterally Skin: warm/dry no rash   Lab Results:  Recent Labs  04/06/15 2040 04/07/15 0349  WBC 7.3 6.3  HGB 12.5* 12.2*  PLT 111* 93*    Recent Labs  04/06/15 2033 04/06/15 2040 04/07/15 0349  NA 142  --  139  K 4.7  --  4.7  CL 103  --  105  CO2  --   --  29  GLUCOSE 100*  --  117*  BUN 23*  --  17  CREATININE 1.30* 1.41* 1.36*   No results for input(s): TROPONINI in the last 72 hours.  Invalid input(s): CK, MB  Cardiac Studies: 2-D echocardiogram pending  Tele: Personally reviewed: Sinus rhythm without significant arrhythmia  Assessment/Plan:  1. Acute on chronic diastolic heart failure, improved after TAVR 2. Severe aortic stenosis postoperative day From TAVR 3. thrombocytopenia, mild, expected 4. Multivessel CAD without symptoms of angina 5. Chronic pressure ulcer 6. Chronic kidney disease, stage III  The patient is progressing well. He has back on aspirin and Plavix. We'll transfer him to a telemetry bed. 2D echocardiogram pending today.  Tonny Bollman, M.D. 04/07/2015,  12:34 PM

## 2015-04-07 NOTE — Progress Notes (Signed)
[address]       [place] [address]             [phone]        CARDIOTHORACIC SURGERY PROGRESS NOTE   R1 Day Post-Op Procedure(s) (LRB): TRANSCATHETER AORTIC VALVE REPLACEMENT, TRANSFEMORAL (N/A) TRANSESOPHAGEAL ECHOCARDIOGRAM (TEE) (N/A)  Subjective: Looks good.  Mild soreness in right groin.  Feels that chronic dyspnea might be slightly improved.  Needs a lot of assistance w/ mobility  Objective: Vital signs: BP Readings from Last 1 Encounters:  04/07/15 139/50   Pulse Readings from Last 1 Encounters:  04/07/15 69   Resp Readings from Last 1 Encounters:  04/07/15 18   Temp Readings from Last 1 Encounters:  04/07/15 97.4 F (36.3 C) Oral    Hemodynamics: PAP: (43-62)/(17-28) 47/18 mmHg CO:  [6.2 L/min-6.6 L/min] 6.2 L/min CI:  [2.8 L/min/m2-3 L/min/m2] 2.8 L/min/m2  Physical Exam:  Rhythm:   sinus  Breath sounds: clear  Heart sounds:  RRR  Incisions:  Clean and dry  Abdomen:  Soft, non-distended, non-tender  Extremities:  Warm, well-perfused    Intake/Output from previous day: 09/13 0701 - 09/14 0700 In: 1757.7 [P.O.:180; I.V.:1477.7; IV Piggyback:100] Out: 1855 [Urine:1705; Blood:150] Intake/Output this shift: Total I/O In: -  Out: 75 [Urine:75]  Lab Results:  CBC: Recent Labs  04/06/15 2040 04/07/15 0349  WBC 7.3 6.3  HGB 12.5* 12.2*  HCT 40.0 39.8  PLT 111* 93*    BMET:  Recent Labs  04/06/15 2033 04/06/15 2040 04/07/15 0349  NA 142  --  139  K 4.7  --  4.7  CL 103  --  105  CO2  --   --  29  GLUCOSE 100*  --  117*  BUN 23*  --  17  CREATININE 1.30* 1.41* 1.36*  CALCIUM  --   --  7.9*     PT/INR:   Recent Labs  04/06/15 1424  LABPROT 14.8  INR 1.14    CBG (last 3)   Recent Labs  04/06/15 1745 04/06/15 2142 04/07/15 0807  GLUCAP 85 108* 133*    ABG    Component Value Date/Time   PHART 7.342* 04/06/2015 1523   PCO2ART 52.3* 04/06/2015 1523   PO2ART 62.0* 04/06/2015 1523   HCO3  28.8* 04/06/2015 1523   TCO2 27 04/06/2015 2033   O2SAT 91.0 04/06/2015 1523    CXR: PORTABLE CHEST - 1 VIEW  COMPARISON: April 06, 2015  FINDINGS: Swan-Ganz catheter is been removed. Cordis tip is in the superior vena cava near the junction with the right jugular vein. No pneumothorax. There is mild left base atelectasis. Lungs elsewhere clear. Heart is upper normal in size with pulmonary vascularity within normal limits. No adenopathy. There is degenerative change in both shoulders.  IMPRESSION: Cordis tip in superior vena cava. No pneumothorax. Left base atelectasis. Lungs otherwise clear. No change in cardiac silhouette.   Electronically Signed  By: Bretta Bang III M.D.  On: 04/07/2015 08:10   Assessment/Plan: S/P Procedure(s) (LRB): TRANSCATHETER AORTIC VALVE REPLACEMENT, TRANSFEMORAL (N/A) TRANSESOPHAGEAL ECHOCARDIOGRAM (TEE) (N/A)  Overall doing well POD1 Maintaining NSR w/ stable BP Expected post op acute blood loss anemia, very mild Expected post op atelectasis, mild Chronic diastolic CHF Multivessel CAD w/out angina pectoris CKD stage III OSA on sleep apnea Poor mobility Chronic pressure ulcer   Resume pre-op meds including DAPT  Mobilize  D/C lines  Routine f/u ECHO   PT consult  I spent in excess  of 15 minutes during the conduct of this hospital encounter and >50% of this time involved direct face-to-face encounter with the patient for counseling and/or coordination of their care.   Purcell Nails 04/07/2015 9:22 AM

## 2015-04-07 NOTE — Care Management Note (Signed)
Case Management Note  Patient Details  Name: [name] MRN: [id number] Date of Birth: April 16, 1933  Subjective/Objective:     Per previous CM note, lives at home alone and has [facility] for wound treatment.  May need SNF on discharge.  Will continue to follow for progression and plan.                Action/Plan:   Expected Discharge Date:                  Expected Discharge Plan:  Skilled Nursing Facility  In-House Referral:  Clinical Social Work  Discharge planning Services     Post Acute Care Choice:    Choice offered to:     DME Arranged:    DME Agency:     HH Arranged:    HH Agency:     Status of Service:  In process, will continue to follow  Medicare Important Message Given:    Date Medicare IM Given:    Medicare IM give by:    Date Additional Medicare IM Given:    Additional Medicare Important Message give by:     If discussed at Long Length of Stay Meetings, dates discussed:    Additional Comments: Patient confirmed he does live alone and does have [facility].  States family lives close, and one of his daughters lives next door to him.  His son in law is home now on medical leave and can be with him.  Aware that someone needs to be with him 24/7 on discharge.  States he will talk with his family.   Vangie Bicker, RN 04/07/2015, 3:10 PM

## 2015-04-08 ENCOUNTER — Encounter (HOSPITAL_COMMUNITY): Payer: Self-pay | Admitting: *Deleted

## 2015-04-08 DIAGNOSIS — N183 Chronic kidney disease, stage 3 (moderate): Secondary | ICD-10-CM

## 2015-04-08 DIAGNOSIS — I2510 Atherosclerotic heart disease of native coronary artery without angina pectoris: Secondary | ICD-10-CM

## 2015-04-08 DIAGNOSIS — G4733 Obstructive sleep apnea (adult) (pediatric): Secondary | ICD-10-CM

## 2015-04-08 LAB — BASIC METABOLIC PANEL
ANION GAP: 6 (ref 5–15)
BUN: 14 mg/dL (ref 6–20)
CHLORIDE: 104 mmol/L (ref 101–111)
CO2: 30 mmol/L (ref 22–32)
Calcium: 9 mg/dL (ref 8.9–10.3)
Creatinine, Ser: 1.29 mg/dL — ABNORMAL HIGH (ref 0.61–1.24)
GFR calc Af Amer: 59 mL/min — ABNORMAL LOW (ref >60)
GFR, EST NON AFRICAN AMERICAN: 51 mL/min — AB (ref >60)
Glucose, Bld: 178 mg/dL — ABNORMAL HIGH (ref 65–99)
POTASSIUM: 5.2 mmol/L — AB (ref 3.5–5.1)
SODIUM: 140 mmol/L (ref 135–145)

## 2015-04-08 LAB — CBC
HEMATOCRIT: 41.9 % (ref 39.0–52.0)
HEMOGLOBIN: 13.1 g/dL (ref 13.0–17.0)
MCH: 30.2 pg (ref 26.0–34.0)
MCHC: 31.3 g/dL (ref 30.0–36.0)
MCV: 96.5 fL (ref 78.0–100.0)
Platelets: DECREASED 10*3/uL (ref 150–400)
RBC: 4.34 MIL/uL (ref 4.22–5.81)
RDW: 15.8 % — AB (ref 11.5–15.5)
WBC: 8.4 10*3/uL (ref 4.0–10.5)

## 2015-04-08 LAB — GLUCOSE, CAPILLARY
GLUCOSE-CAPILLARY: 107 mg/dL — AB (ref 65–99)
GLUCOSE-CAPILLARY: 142 mg/dL — AB (ref 65–99)
Glucose-Capillary: 127 mg/dL — ABNORMAL HIGH (ref 65–99)
Glucose-Capillary: 148 mg/dL — ABNORMAL HIGH (ref 65–99)

## 2015-04-08 MED ORDER — DOCUSATE SODIUM 100 MG PO CAPS
100.0000 mg | ORAL_CAPSULE | Freq: Two times a day (BID) | ORAL | Status: DC
  Administered 2015-04-08 – 2015-04-09 (×3): 100 mg via ORAL
  Filled 2015-04-08 (×3): qty 1

## 2015-04-08 MED ORDER — TAMSULOSIN HCL 0.4 MG PO CAPS
0.4000 mg | ORAL_CAPSULE | Freq: Every day | ORAL | Status: DC
  Administered 2015-04-08 – 2015-04-09 (×2): 0.4 mg via ORAL
  Filled 2015-04-08 (×2): qty 1

## 2015-04-08 MED ORDER — METOPROLOL SUCCINATE ER 25 MG PO TB24
25.0000 mg | ORAL_TABLET | Freq: Every day | ORAL | Status: DC
  Administered 2015-04-08 – 2015-04-09 (×2): 25 mg via ORAL
  Filled 2015-04-08 (×2): qty 1

## 2015-04-08 MED ORDER — FUROSEMIDE 40 MG PO TABS
40.0000 mg | ORAL_TABLET | Freq: Every day | ORAL | Status: DC
  Administered 2015-04-08 – 2015-04-09 (×2): 40 mg via ORAL
  Filled 2015-04-08 (×2): qty 1

## 2015-04-08 MED ORDER — LORAZEPAM 1 MG PO TABS
1.0000 mg | ORAL_TABLET | Freq: Two times a day (BID) | ORAL | Status: DC | PRN
  Administered 2015-04-08: 1 mg via ORAL
  Filled 2015-04-08: qty 1

## 2015-04-08 MED ORDER — ACETAMINOPHEN 325 MG PO TABS: 650.0000 mg | ORAL_TABLET | Freq: Four times a day (QID) | ORAL | Status: DC | PRN

## 2015-04-08 NOTE — Care Management Note (Signed)
Case Management Note  Patient Details  Name: Phinn Stauff MRN: [id number] Date of Birth: 06-10-1933  Subjective/Objective:     Per previous CM note, lives at home alone and has [facility] for wound treatment.  May need SNF on discharge.  Will continue to follow for progression and plan.                Action/Plan:   Expected Discharge Date:                  Expected Discharge Plan:  Skilled Nursing Facility  In-House Referral:  Clinical Social Work  Discharge planning Services     Post Acute Care Choice:    Choice offered to:     DME Arranged:    DME Agency:     HH Arranged:    HH Agency:     Status of Service:  In process, will continue to follow  Medicare Important Message Given:    Date Medicare IM Given:    Medicare IM give by:    Date Additional Medicare IM Given:    Additional Medicare Important Message give by:     If discussed at Long Length of Stay Meetings, dates discussed:    Additional Comments: Patient confirmed he does live alone and does have [facility].  States family lives close, and one of his daughters lives next door to him.  His son in law is home now on medical leave and can be with him.  Aware that someone needs to be with him 24/7 on discharge.  States he will talk with his family.   Talked with patient again, sitting up in chair eating brkfst.  Looks good.  States he talked with his daughter and his son in law will be able to be with him.  Staff report that today he has not needed assistance getting up and moving.  Patient gave me permission to talk with son in law, Renae Fickle.  Paula Libra, confirmed he will be with his father in law on discharge.   Vangie Bicker, RN 04/08/2015, 2:58 PM

## 2015-04-08 NOTE — Progress Notes (Signed)
Placed pt. On cpap. Pt. Tolerating well at this time. Pt. Has 6L 02 titrated into the cpap.

## 2015-04-08 NOTE — Progress Notes (Signed)
Physical Therapy Treatment Patient Details Name: [name] MRN: [id number] DOB: 30-Aug-1932 Today's Date: 04/08/2015    History of Present Illness Pt admitted for TAVR for mgmt of severe AS with PMH of restless leg syndrome, RA, CKD III, DMII, CAD, OSA, HTN    PT Comments    Patient progressing very well. Goal to return to walking with no device, however does already own RW if needed on discharge.    Follow Up Recommendations  Home health PT;Supervision - Intermittent     Equipment Recommendations  None recommended by PT    Recommendations for Other Services OT consult     Precautions / Restrictions Precautions Precautions: Fall Restrictions Weight Bearing Restrictions: No    Mobility  Bed Mobility Overal bed mobility: Modified Independent             General bed mobility comments: HOB elevated 20; no rail  Transfers Overall transfer level: Needs assistance Equipment used: Rolling walker (2 wheeled) Transfers: Sit to/from Stand Sit to Stand: Min guard         General transfer comment: vc for safe use of RW  Ambulation/Gait Ambulation/Gait assistance: Min guard Ambulation Distance (Feet): 300 Feet Assistive device: Rolling walker (2 wheeled) Gait Pattern/deviations: Step-through pattern;Trunk flexed Gait velocity: WFL   General Gait Details: pt ambulates with fast pace despite cues to slow down (especially to keep equipment/lines attached to him!)   Careers information officer    Modified Rankin (Stroke Patients Only)       Balance Overall balance assessment: Needs assistance   Sitting balance-Leahy Scale: Good     Standing balance support: No upper extremity supported Standing balance-Leahy Scale: Good                 High Level Balance Comments: feet shoulder width eyes closed x 20 sec with normal sway and reactions to maintain his balance    Cognition Arousal/Alertness: Awake/alert Behavior During Therapy:  WFL for tasks assessed/performed;Impulsive Overall Cognitive Status: Within Functional Limits for tasks assessed                      Exercises      General Comments        Pertinent Vitals/Pain SaO2 92-96% on 5L HR 90-123 (encouraged slower pace, however pt insisted he just walks this way)  Pain Assessment: Faces Faces Pain Scale: Hurts even more Pain Location: groin/incision Pain Descriptors / Indicators: Grimacing Pain Intervention(s): Limited activity within patient's tolerance;Monitored during session;Repositioned    Home Living                      Prior Function            PT Goals (current goals can now be found in the care plan section) Acute Rehab PT Goals Patient Stated Goal: go home PT Goal Formulation: With patient Time For Goal Achievement: 04/21/15 Potential to Achieve Goals: Good Progress towards PT goals: Progressing toward goals    Frequency  Min 3X/week    PT Plan Current plan remains appropriate    Co-evaluation             End of Session Equipment Utilized During Treatment: Gait belt;Oxygen Activity Tolerance: Patient tolerated treatment well Patient left: in chair;with call bell/phone within reach;with chair alarm set     Time: 4098-1191 PT Time Calculation (min) (ACUTE ONLY): 21 min  Charges:  $Gait Training: 8-22 mins  G Codes:      Kennan Detter 04/08/2015, 4:39 PM  Pager [phone]

## 2015-04-08 NOTE — Progress Notes (Signed)
[address]       [place] [address]             [phone]        CARDIOTHORACIC SURGERY PROGRESS NOTE   R2 Days Post-Op Procedure(s) (LRB): TRANSCATHETER AORTIC VALVE REPLACEMENT, TRANSFEMORAL (N/A) TRANSESOPHAGEAL ECHOCARDIOGRAM (TEE) (N/A)  Subjective: Feels well. No complaints  Objective: Vital signs: BP Readings from Last 1 Encounters:  04/08/15 132/43   Pulse Readings from Last 1 Encounters:  04/08/15 79   Resp Readings from Last 1 Encounters:  04/08/15 26   Temp Readings from Last 1 Encounters:  04/08/15 98 F (36.7 C) Oral    Hemodynamics:    Physical Exam:  Rhythm:   sinus  Breath sounds: clear  Heart sounds:  RRR  Incisions:  Clean and dry  Abdomen:  Soft, non-distended, non-tender  Extremities:  Warm, well-perfused    Intake/Output from previous day: 09/14 0701 - 09/15 0700 In: 1370 [P.O.:1320; IV Piggyback:50] Out: 810 [Urine:810] Intake/Output this shift: Total I/O In: 360 [P.O.:360] Out: -   Lab Results:  CBC: Recent Labs  04/07/15 0349 04/08/15 0950  WBC 6.3 8.4  HGB 12.2* 13.1  HCT 39.8 41.9  PLT 93* PLATELET CLUMPS NOTED ON SMEAR, COUNT APPEARS DECREASED    BMET:  Recent Labs  04/07/15 0349 04/08/15 0950  NA 139 140  K 4.7 5.2*  CL 105 104  CO2 29 30  GLUCOSE 117* 178*  BUN 17 14  CREATININE 1.36* 1.29*  CALCIUM 7.9* 9.0     PT/INR:   Recent Labs  04/06/15 1424  LABPROT 14.8  INR 1.14    CBG (last 3)   Recent Labs  04/07/15 2152 04/08/15 0823 04/08/15 1248  GLUCAP 102* 127* 107*    ABG    Component Value Date/Time   PHART 7.342* 04/06/2015 1523   PCO2ART 52.3* 04/06/2015 1523   PO2ART 62.0* 04/06/2015 1523   HCO3 28.8* 04/06/2015 1523   TCO2 27 04/06/2015 2033   O2SAT 91.0 04/06/2015 1523    CXR: N/A    Transthoracic Echocardiography  Patient:  [name], [name] MR #:    [id number] Study Date: 04/07/2015 Gender:   M Age:    82 Height:   177.8  cm Weight:   109.3 kg BSA:    2.36 m^2 Pt. Status: Room:    2S11C  ADMITTING  Tressie Stalker, M.D. ATTENDING  Tressie Stalker, M.D. ORDERING   Tonny Bollman, MD SONOGRAPHER Delcie Roch, RDCS, CCT PERFORMING  [facility], Inpatient  cc:  ------------------------------------------------------------------- LV EF: 55% -  60%  ------------------------------------------------------------------- History:  PMH: TAVR POD #1 study. Coronary artery disease. Risk factors: Chronic kidney disease. Obstructive sleep apnea. Hypertension. Diabetes mellitus.  ------------------------------------------------------------------- Study Conclusions  - Left ventricle: The cavity size was normal. Systolic function was normal. The estimated ejection fraction was in the range of 55% to 60%. - Aortic valve: S/P placement of a 23 mm Sapien 3 valve. Normal systolic gradients and no significant peri valvular regurgitation. Valve area (VTI): 1.51 cm^2. Valve area (Vmax): 1.35 cm^2. Valve area (Vmean): 1.37 cm^2. - Mitral valve: There was mild regurgitation. Valve area by pressure half-time: 2.42 cm^2. Valve area by continuity equation (using LVOT flow): 1.36 cm^2. - Left atrium: The atrium was mildly dilated. - Atrial septum: A patent foramen ovale cannot be excluded.  Transthoracic echocardiography. M-mode, complete 2D, spectral Doppler, and color Doppler. Birthdate: Patient birthdate: 06-Jul-1933. Age: Patient is 81 yr old. Sex: Gender: male. BMI: 34.6 kg/m^2. Blood  pressure:   139/50 Patient status: Inpatient. Study date: Study date: 04/07/2015. Study time: 03:13 PM. Location: ICU/CCU  -------------------------------------------------------------------  ------------------------------------------------------------------- Left ventricle: The cavity size was normal. Systolic function was normal. The estimated ejection fraction was in the range of  55% to 60%.  ------------------------------------------------------------------- Aortic valve: S/P placement of a 23 mm Sapien 3 valve. Normal systolic gradients and no significant peri valvular regurgitation. Doppler:   VTI ratio of LVOT to aortic valve: 0.6. Valve area (VTI): 1.51 cm^2. Indexed valve area (VTI): 0.64 cm^2/m^2. Peak velocity ratio of LVOT to aortic valve: 0.53. Valve area (Vmax): 1.35 cm^2. Indexed valve area (Vmax): 0.57 cm^2/m^2. Mean velocity ratio of LVOT to aortic valve: 0.54. Valve area (Vmean): 1.37 cm^2. Indexed valve area (Vmean): 0.58 cm^2/m^2.  Mean gradient (S): 14 mm Hg. Peak gradient (S): 26 mm Hg.  ------------------------------------------------------------------- Aorta: The aorta was normal, not dilated, and non-diseased.  ------------------------------------------------------------------- Mitral valve:  Mildly thickened leaflets . Doppler: There was mild regurgitation.  Valve area by pressure half-time: 2.42 cm^2. Indexed valve area by pressure half-time: 1.02 cm^2/m^2. Valve area by continuity equation (using LVOT flow): 1.36 cm^2. Indexed valve area by continuity equation (using LVOT flow): 0.58 cm^2/m^2. Mean gradient (D): 5 mm Hg. Peak gradient (D): 10 mm Hg.  ------------------------------------------------------------------- Left atrium: The atrium was mildly dilated.  ------------------------------------------------------------------- Atrial septum: Poorly visualized. A patent foramen ovale cannot be excluded.  ------------------------------------------------------------------- Right ventricle: The cavity size was normal. Wall thickness was normal. Systolic function was normal.  ------------------------------------------------------------------- Pulmonic valve:  Doppler: There was mild regurgitation.  ------------------------------------------------------------------- Tricuspid valve:  Doppler: There was mild  regurgitation.  ------------------------------------------------------------------- Right atrium: The atrium was normal in size.  ------------------------------------------------------------------- Pericardium: The pericardium was normal in appearance.  ------------------------------------------------------------------- Post procedure conclusions Ascending Aorta:  - The aorta was normal, not dilated, and non-diseased.  ------------------------------------------------------------------- Measurements  Left ventricle              Value     Reference Stroke volume, 2D             90  ml    --------- Stroke volume/bsa, 2D           38  ml/m^2  --------- LV e&', lateral              9.03 cm/s   --------- LV E/e&', lateral             17.72     --------- LV e&', medial               6.53 cm/s   --------- LV E/e&', medial              24.5      --------- LV e&', average              7.78 cm/s   --------- LV E/e&', average             20.57     ---------  LVOT                   Value     Reference LVOT ID, S                18  mm    --------- LVOT area                 2.54 cm^2   --------- LVOT peak velocity, S           134  cm/s   ---------  LVOT mean velocity, S           93.7 cm/s   --------- LVOT VTI, S                35.4 cm    --------- LVOT peak gradient, S           7   mm Hg  ---------  Aortic valve               Value     Reference Aortic valve peak velocity, S       253  cm/s   --------- Aortic valve mean velocity, S       174  cm/s   --------- Aortic valve VTI, S            59.4 cm    --------- Aortic mean gradient, S           14  mm Hg  --------- Aortic peak gradient, S          26  mm Hg  --------- VTI ratio, LVOT/AV            0.6      --------- Aortic valve area, VTI          1.51 cm^2   --------- Aortic valve area/bsa, VTI        0.64 cm^2/m^2 --------- Velocity ratio, peak, LVOT/AV       0.53      --------- Aortic valve area, peak velocity     1.35 cm^2   --------- Aortic valve area/bsa, peak velocity   0.57 cm^2/m^2 --------- Velocity ratio, mean, LVOT/AV       0.54      --------- Aortic valve area, mean velocity     1.37 cm^2   --------- Aortic valve area/bsa, mean velocity   0.58 cm^2/m^2 ---------  Left atrium                Value     Reference LA volume, S               80.5 ml    --------- LA volume/bsa, S             34.1 ml/m^2  --------- LA volume, ES, 1-p A4C          85.2 ml    --------- LA volume/bsa, ES, 1-p A4C        36.1 ml/m^2  --------- LA volume, ES, 1-p A2C          71.5 ml    --------- LA volume/bsa, ES, 1-p A2C        30.3 ml/m^2  ---------  Mitral valve               Value     Reference Mitral E-wave peak velocity        160  cm/s   --------- Mitral A-wave peak velocity        139  cm/s   --------- Mitral mean velocity, D          97  cm/s   --------- Mitral deceleration time         225  ms    150 - 230 Mitral pressure half-time         91  ms    --------- Mitral mean gradient, D          5   mm Hg  --------- Mitral peak gradient, D  10  mm Hg  --------- Mitral E/A ratio, peak          1.2      --------- Mitral valve area, PHT, DP        2.42 cm^2   --------- Mitral valve area/bsa, PHT, DP       1.02 cm^2/m^2 --------- Mitral valve area, LVOT continuity    1.36 cm^2   --------- Mitral valve area/bsa, LVOT        0.58 cm^2/m^2 --------- continuity Mitral annulus VTI, D           66.1 cm    ---------  Systemic veins              Value     Reference Estimated CVP               3   mm Hg  ---------  Right ventricle              Value     Reference RV s&', lateral, S             14.3 cm/s   ---------  Legend: (L) and (H) mark values outside specified reference range.  ------------------------------------------------------------------- Prepared and Electronically Authenticated by  Charlton Haws, M.D. 2016-09-14T16:11:43  Assessment/Plan: S/P Procedure(s) (LRB): TRANSCATHETER AORTIC VALVE REPLACEMENT, TRANSFEMORAL (N/A) TRANSESOPHAGEAL ECHOCARDIOGRAM (TEE) (N/A)  Doing well POD2 I agree w/ plan as outlined by Dr Verdell Face 04/08/2015 1:46 PM

## 2015-04-08 NOTE — Progress Notes (Addendum)
SUBJECTIVE: No chest pain or SOB this am.   BP 136/49 mmHg  Pulse 96  Temp(Src) 98.2 F (36.8 C) (Oral)  Resp 25  Ht 5\' 10"  (1.778 m)  Wt 246 lb 4.1 oz (111.7 kg)  BMI 35.33 kg/m2  SpO2 99%  Intake/Output Summary (Last 24 hours) at 04/08/15 0940 Last data filed at 04/08/15 0600  Gross per 24 hour  Intake   1130 ml  Output    735 ml  Net    395 ml    PHYSICAL EXAM General: Well developed, well nourished, in no acute distress. Alert and oriented x 3.  Psych:  Good affect, responds appropriately Neck: No JVD. No masses noted.  Lungs: Clear bilaterally with no wheezes or rhonci noted.  Heart: RRR with no murmurs noted. Abdomen: Bowel sounds are present. Soft, non-tender.  Extremities: No lower extremity edema.   LABS: Basic Metabolic Panel:  Recent Labs  76/80/88 2033 04/06/15 2040 04/07/15 0349  NA 142  --  139  K 4.7  --  4.7  CL 103  --  105  CO2  --   --  29  GLUCOSE 100*  --  117*  BUN 23*  --  17  CREATININE 1.30* 1.41* 1.36*  CALCIUM  --   --  7.9*  MG  --  2.1 1.9   CBC:  Recent Labs  04/06/15 2040 04/07/15 0349  WBC 7.3 6.3  HGB 12.5* 12.2*  HCT 40.0 39.8  MCV 94.6 96.1  PLT 111* 93*   Current Meds: . acetaminophen (TYLENOL) oral liquid 160 mg/5 mL  650 mg Per Tube Once   Or  . acetaminophen  650 mg Rectal Once  . allopurinol  300 mg Oral q morning - 10a  . aspirin EC  81 mg Oral Daily  . buPROPion  150 mg Oral q morning - 10a  . cefUROXime (ZINACEF)  IV  1.5 g Intravenous Q12H  . clopidogrel  75 mg Oral Daily  . docusate sodium  100 mg Oral BID  . ferrous gluconate  324 mg Oral Daily  . furosemide  40 mg Oral Daily  . Gerhardt's butt cream   Topical BID  . insulin aspart  0-15 Units Subcutaneous TID WC  . metoprolol succinate  25 mg Oral Daily  . mirabegron ER  50 mg Oral Daily  . mirtazapine  15 mg Oral QHS  . nitroGLYCERIN      . pantoprazole  40 mg Oral Daily  . pregabalin  200 mg Oral QHS  . rOPINIRole  4 mg Oral QHS  .  tamsulosin  0.4 mg Oral Daily   Echo 04/07/15: Left ventricle: The cavity size was normal. Systolic function was normal. The estimated ejection fraction was in the range of 55% to 60%. - Aortic valve: S/P placement of a 23 mm Sapien 3 valve. Normal systolic gradients and no significant peri valvular regurgitation. Valve area (VTI): 1.51 cm^2. Valve area (Vmax): 1.35 cm^2. Valve area (Vmean): 1.37 cm^2. - Mitral valve: There was mild regurgitation. Valve area by pressure half-time: 2.42 cm^2. Valve area by continuity equation (using LVOT flow): 1.36 cm^2. - Left atrium: The atrium was mildly dilated. - Atrial septum: A patent foramen ovale cannot be excluded.  ASSESSMENT AND PLAN:  1. Severe aortic valve stenosis: POD # 2 s/p TAVR. Echo yesterday with normally functioning bioprosthetic valve. He is on ASA and Plavix.   2. Acute on chronic diastolic heart failure:  improved after TAVR  3. Multivessel CAD: He had one episode of sharp chest pain last night that resolved quickly. Continue current meds.   4. Chronic pressure ulcer, left buttock.   5. Chronic kidney disease, stage III: stable. Repeat BMET this am.   6. OSA: he is using CPAP at home.   Transfer to floor today and ambulate.   [name],Mason  9/15/20168:33 AM

## 2015-04-08 NOTE — Progress Notes (Signed)
Patient placed on CPAP of 10. 6L of O2 bled in. Patient tolerating well sat 92%. Rt will continue to monitor as needed.

## 2015-04-09 ENCOUNTER — Other Ambulatory Visit: Payer: Self-pay | Admitting: Cardiology

## 2015-04-09 ENCOUNTER — Telehealth: Payer: Self-pay | Admitting: Cardiovascular Disease

## 2015-04-09 ENCOUNTER — Other Ambulatory Visit: Payer: Self-pay

## 2015-04-09 DIAGNOSIS — Z952 Presence of prosthetic heart valve: Secondary | ICD-10-CM

## 2015-04-09 DIAGNOSIS — I350 Nonrheumatic aortic (valve) stenosis: Secondary | ICD-10-CM

## 2015-04-09 DIAGNOSIS — I5033 Acute on chronic diastolic (congestive) heart failure: Secondary | ICD-10-CM

## 2015-04-09 LAB — GLUCOSE, CAPILLARY
Glucose-Capillary: 120 mg/dL — ABNORMAL HIGH (ref 65–99)
Glucose-Capillary: 164 mg/dL — ABNORMAL HIGH (ref 65–99)

## 2015-04-09 LAB — BASIC METABOLIC PANEL
Anion gap: 8 (ref 5–15)
BUN: 17 mg/dL (ref 6–20)
CALCIUM: 9 mg/dL (ref 8.9–10.3)
CHLORIDE: 101 mmol/L (ref 101–111)
CO2: 30 mmol/L (ref 22–32)
CREATININE: 1.26 mg/dL — AB (ref 0.61–1.24)
GFR calc non Af Amer: 52 mL/min — ABNORMAL LOW (ref >60)
Glucose, Bld: 116 mg/dL — ABNORMAL HIGH (ref 65–99)
Potassium: 4.2 mmol/L (ref 3.5–5.1)
SODIUM: 139 mmol/L (ref 135–145)

## 2015-04-09 LAB — CBC
HEMATOCRIT: 38.2 % — AB (ref 39.0–52.0)
HEMOGLOBIN: 12.2 g/dL — AB (ref 13.0–17.0)
MCH: 30.6 pg (ref 26.0–34.0)
MCHC: 31.9 g/dL (ref 30.0–36.0)
MCV: 95.7 fL (ref 78.0–100.0)
Platelets: 79 10*3/uL — ABNORMAL LOW (ref 150–400)
RBC: 3.99 MIL/uL — ABNORMAL LOW (ref 4.22–5.81)
RDW: 15.8 % — AB (ref 11.5–15.5)
WBC: 7.4 10*3/uL (ref 4.0–10.5)

## 2015-04-09 MED ORDER — FUROSEMIDE 40 MG PO TABS: 40.0000 mg | ORAL_TABLET | Freq: Two times a day (BID) | ORAL | Status: AC

## 2015-04-09 MED ORDER — FUROSEMIDE 40 MG PO TABS: 40.0000 mg | ORAL_TABLET | Freq: Two times a day (BID) | ORAL | Status: DC

## 2015-04-09 MED ORDER — NITROGLYCERIN 0.4 MG SL SUBL: 0.4000 mg | SUBLINGUAL_TABLET | SUBLINGUAL | Status: DC | PRN

## 2015-04-09 MED ORDER — METOPROLOL SUCCINATE ER 25 MG PO TB24: 25.0000 mg | ORAL_TABLET | Freq: Every day | ORAL | Status: AC

## 2015-04-09 NOTE — Consult Note (Signed)
   [facility] CM Inpatient Consult   04/09/2015  [name] 09/17/1932 [id number] Referral received to assess for care management services and community follow up post hospital stay.  Met with patient at bedside. Explained that [facility] is a covered benefit of Medicare insurance. Patient endorses that his primary care provider is [facility]. Patient states he lives alone but his family checks on him almost everyday and on the weekends.  Review information for [facility] and a folder was provided with contact information.  Explained that [facility] does not interfere with or replace any services arranged by the inpatient care management staff.  Patient declined services with [facility] stating that he usually has [place] for home health and didn't think he needed anything more.  A brochure with contact information was given and left on bedside table.   For questions or referrals, Please contact: Natividad Brood, RN BSN [facility] Liaison  [phone] business mobile phone

## 2015-04-09 NOTE — Discharge Instructions (Signed)
Keep surgical area clean and dry, do not soak is bath in 1 week. Monitor for sign of infection include redness, fever or chill, please call cardiology or seek medical attention if does have signs of infection.

## 2015-04-09 NOTE — Progress Notes (Signed)
04/09/2015 4:46 PM D/c avs form, medications already taken today and those due this evening given and explained to patient. Follow up appointments and when to call MD reviewed. RX reviewed. Incision site care, activity restrictions and when to call MD reviewed.  D/c iv. D/c tele. D/c home per orders.  Quinntin Malter, Blanchard Kelch

## 2015-04-09 NOTE — Care Management Important Message (Signed)
Important Message  Patient Details  Name: Bethel Deery MRN: [id number] Date of Birth: [birth year]   Medicare Important Message Given:  Yes-second notification given    Kyla Balzarine 04/09/2015, 11:26 AM

## 2015-04-09 NOTE — Telephone Encounter (Signed)
New Message        TCM appt on 04/14/15 at 10:30 w/ Ronie Spies PA.

## 2015-04-09 NOTE — Progress Notes (Signed)
      [address]       [place] [address]             [phone]        3 Days Post-Op Procedure(s) (LRB): TRANSCATHETER AORTIC VALVE REPLACEMENT, TRANSFEMORAL (N/A) TRANSESOPHAGEAL ECHOCARDIOGRAM (TEE) (N/A)  Subjective: Only complaint is "food is not too good"  Objective: Vital signs in last 24 hours: Temp:  [98 F (36.7 C)-98.5 F (36.9 C)] 98.2 F (36.8 C) (09/16 0558) Pulse Rate:  [68-95] 90 (09/16 0558) Cardiac Rhythm:  [-] Normal sinus rhythm (09/16 0740) Resp:  [18-35] 20 (09/16 0558) BP: (122-153)/(42-82) 140/52 mmHg (09/16 0558) SpO2:  [90 %-98 %] 90 % (09/16 0558) FiO2 (%):  [44 %] 44 % (09/15 2230) Weight:  [242 lb 9.6 oz (110.043 kg)] 242 lb 9.6 oz (110.043 kg) (09/16 0558)   Current Weight  04/09/15 242 lb 9.6 oz (110.043 kg)      Intake/Output from previous day: 09/15 0701 - 09/16 0700 In: 840 [P.O.:840] Out: 1260 [Urine:1260]   Physical Exam:  Cardiovascular: RRR Pulmonary: Clear to auscultation bilaterally; no rales, wheezes, or rhonchi. Abdomen: Soft, non tender, bowel sounds present. Extremities:  Chronic venous stasis changes. Lower extremities are warm. Wounds: Right groin wound is clean and dry.  No erythema or signs of infection. Minor ecchymosis  Lab Results: CBC: Recent Labs  04/08/15 0950 04/09/15 0536  WBC 8.4 7.4  HGB 13.1 12.2*  HCT 41.9 38.2*  PLT PLATELET CLUMPS NOTED ON SMEAR, COUNT APPEARS DECREASED 79*   BMET:  Recent Labs  04/08/15 0950 04/09/15 0521  NA 140 139  K 5.2* 4.2  CL 104 101  CO2 30 30  GLUCOSE 178* 116*  BUN 14 17  CREATININE 1.29* 1.26*  CALCIUM 9.0 9.0    PT/INR:  Lab Results  Component Value Date   INR 1.14 04/06/2015   INR 1.06 04/02/2015   INR 0.98 03/12/2015   ABG:  INR: Will add last result for INR, ABG once components are confirmed Will add last 4 CBG results once components are confirmed  Assessment/Plan:  1. CV - SR in the 90's. On Toprol XL 25 mg  daily, ecasa 81 mg daily, and Plavix 75 mg daily. 2.  Pulmonary - On room air this am.Encourage incentive spirometer 3.  Anemia-H and H stable at 12.2 and 38.2. On [place] 4. Thrombocytopenia-platelets 79,000 this am 5. Hopefully, home soon. Management per Dr. Cyndie Mull MPA-C 04/09/2015,9:13 AM

## 2015-04-09 NOTE — Discharge Summary (Signed)
Discharge Summary   Patient ID: [name],  MRN: [id number], DOB/AGE: 79/18/1934 79 y.o.  Admit date: 04/06/2015 Discharge date: 04/09/2015  Primary Care Provider: [name] Primary Cardiologist: Dr. Donnie Aho Valvular specialist: Dr. Clifton James  Discharge Diagnoses Principal Problem:   S/P TAVR (transcatheter aortic valve replacement) Active Problems:   Type 2 diabetes mellitus with vascular disease   Obesity (BMI 30-39.9)   Hypertensive heart disease   GERD   Obstructive sleep apnea, adult   Aortic stenosis   CAD (coronary artery disease)   Chronic venous insufficiency   CKD (chronic kidney disease), stage III   Obesity hypoventilation syndrome   Chronic diastolic heart failure   Allergies Allergies  Allergen Reactions  . Statins Other (See Comments)    Severe leg myalgias, weakness    Procedures  Echocardiogram 04/07/2015 LV EF: 55% -  60%  ------------------------------------------------------------------- History:  PMH: TAVR POD #1 study. Coronary artery disease. Risk factors: Chronic kidney disease. Obstructive sleep apnea. Hypertension. Diabetes mellitus.  ------------------------------------------------------------------- Study Conclusions  - Left ventricle: The cavity size was normal. Systolic function was normal. The estimated ejection fraction was in the range of 55% to 60%. - Aortic valve: S/P placement of a 23 mm Sapien 3 valve. Normal systolic gradients and no significant peri valvular regurgitation. Valve area (VTI): 1.51 cm^2. Valve area (Vmax): 1.35 cm^2. Valve area (Vmean): 1.37 cm^2. - Mitral valve: There was mild regurgitation. Valve area by pressure half-time: 2.42 cm^2. Valve area by continuity equation (using LVOT flow): 1.36 cm^2. - Left atrium: The atrium was mildly dilated. - Atrial septum: A patent foramen ovale cannot be excluded.    TAVR by Dr. Cornelius Moras and Dr. Excell Seltzer on 04/06/2015   Transcatheter  Aortic Valve Replacement - Transfemoral Approach Edwards Sapien 3 THV (size 23 mm, model # 9600TFX, serial # [id number])  Pre-operative Echo Findings:  Severe aortic stenosis  Normal left ventricular systolic function  Post-operative Echo Findings:  No paravalvular leak  Normal left ventricular systolic function    Hospital Course  The patient is 81 year old male with past medical history of OSA on CPAP, TIA/CVA, carotid artery occlusion, CAD s/p PTCA of LCx and OM bifurcation 2010, h/o staph pericarditis s/p pericardial window 2007, CKD stage III, hypertension, DM and history of CAD with multiple PCI. Patient has been followed by Dr. Donnie Aho. His last cardiac catheterization was in sept 2011  normal Left main, 20 % stenosis proximal LAD, 40% stenosis mid LAD, 70% stenosis proximal Diag 1, 90% stenosis prox CFX, 90% first OM, small and nondominant RCA. Echocardiogram obtained on the 05/29/2014 showed moderate AS with valvular area 0.89. Patient was admitted to hospital in June for shortness of breath with minimal exertion. Echocardiogram was repeated on 01/09/2015 which showed EF 55-60%, grade 2 diastolic dysfunction, reduced aortic cusp separation with moderate stenosis, peak velocity 3.86 cm/s, mean gradient 32 mmHg. When compared to the previous echocardiogram, aortic stenosis has advanced. His hospitalization was complicated by coagulase-negative staph bacteremia which was treated with 4 weeks of antibiotics. He was referred to Dr. Excell Seltzer on 8/19 who felt the patient is a good candidate for TAVR. As part of pre-TAVR workup, he underwent cardiac catheterization on 03/15/2015 which showed 60% mid RCA stenosis, 40% ostial RCA stenosis, 30% ostial LM lesion, 50% ostial left circumflex lesion, 40% proximal to mid LAD. Right heart cath also support severe aortic stenosis, aortic valve appears to be calcified with restricted aortic valve motion, mean transthoracic gradient of 38, calculated  aortic valve area 0.78 cm, aortic valve  area index 0.35. CT angiogram of the heart confirms the presence of severe aortic stenosis. Patient was seen by Dr. Cornelius Moras on 9/6, various options and alternative for management of severe symptomatic aortic stenosis has been discussed at length with the patient, he hopes to proceed with TAVR in the near future as an alternative to high risk surgery. As for his coronary artery disease, although it is significant, he was not having symptoms suggestive of angina, therefore it is treated medically for now and percutaneously in the future should symptoms develop.  Patient presented back to [facility] on 04/[facility]/2015 for planned TAVR. He went into the or on the following day on 9/13 and the received an Edwards Sapien 3 THV (size 23 mm). Postprocedure, valve function is assessed using TEE, there is felt to be no perivalvular leak and no central aortic insufficiency. Her pacemaker and the pigtail catheter along with femoral sheath were removed post procedure without difficulty. He tolerated the procedure well and was transferred to surgical ICU in stable condition. Echocardiogram was obtained on 04/07/2015, showed EF 55-60%, s/p placement of a 23 mm Sapien 3 valve, normal systolic gradient with no significant paravalvular regurgitation, mean valve area 1.37 cm, mild MR. He was transferred to telemetry bed on 9/14. He has some thrombocytopenia, however stable at this time. He was seen on 04/09/2015, he ambulated with cardiac rehabilitation, O2 saturation dropped down to 87% briefly put up quickly with standing rest. He did not qualify for O2. Otherwise he is stable, he is deemed stable for discharge from cardiology perspective. Outpatient 7 day transition of care has been arranged along with follow-up with cardiothoracic surgery and Dr. Excell Seltzer. He will need an echocardiogram on the same day he follows up with Dr. Excell Seltzer. I have contacted Dr. York Spaniel office will contact the  patient to arrange follow-up.  Of note, during this admission his Lasix has been increased to 40 mg twice a day. Given his deconditioned status post TAVR, we have arranged home health PT and RN to help assist the patient  Discharge Vitals Blood pressure 121/53, pulse 90, temperature 98.2 F (36.8 C), temperature source Oral, resp. rate 20, height 5\' 10"  (1.778 m), weight 242 lb 9.6 oz (110.043 kg), SpO2 90 %.  Filed Weights   04/07/15 0500 04/08/15 0600 04/09/15 0558  Weight: 241 lb 14.4 oz (109.725 kg) 246 lb 4.1 oz (111.7 kg) 242 lb 9.6 oz (110.043 kg)    Labs  CBC  Recent Labs  04/08/15 0950 04/09/15 0536  WBC 8.4 7.4  HGB 13.1 12.2*  HCT 41.9 38.2*  MCV 96.5 95.7  PLT PLATELET CLUMPS NOTED ON SMEAR, COUNT APPEARS DECREASED 79*   Basic Metabolic Panel  Recent Labs  04/06/15 2040  04/07/15 0349 04/08/15 0950 04/09/15 0521  NA  --   --  139 140 139  K  --   --  4.7 5.2* 4.2  CL  --   --  105 104 101  CO2  --   < > 29 30 30   GLUCOSE  --   --  117* 178* 116*  BUN  --   --  17 14 17   CREATININE 1.41*  --  1.36* 1.29* 1.26*  CALCIUM  --   < > 7.9* 9.0 9.0  MG 2.1  --  1.9  --   --   < > = values in this interval not displayed.  Disposition  Pt is being discharged home today in good condition.  Follow-up Plans &  Appointments      Follow-up Information    Follow up with [facility] [place] On 04/26/2015.   Why:  1:00pm. Cardiac surgery followup   Contact information:   [address] [place] [address]       Follow up with Ronie Spies, PA-C On 04/14/2015.   Specialties:  Cardiology, Radiology   Why:  10:30am. Cardiology followup   Contact information:   [address] [place] [address] [phone]       Follow up with Tonny Bollman, MD On 05/17/2015.   Specialty:  Cardiology   Why:  9:30am. Obtain Echo at 9:30am, then followup with Dr. Excell Seltzer at 10:30am   Contact information:   [address] [place] [address] [phone]       Follow up with Georga Hacking, MD.   Specialty:  Cardiology   Why:  Dr. York Spaniel office will contact you to arrange followup   Contact information:   [address] [place] [address] [phone]       Discharge Medications    Medication List    STOP taking these medications        amLODipine 5 MG tablet  Commonly known as:  NORVASC      TAKE these medications        acetaminophen 325 MG tablet  Commonly known as:  TYLENOL  Take 2 tablets (650 mg total) by mouth every 6 (six) hours as needed for mild pain or moderate pain.     allopurinol 300 MG tablet  Commonly known as:  ZYLOPRIM  Take 1 tablet (300 mg total) by mouth every morning.     aspirin EC 81 MG tablet  Take 81 mg by mouth daily.     buPROPion 150 MG 24 hr tablet  Commonly known as:  WELLBUTRIN XL  Take 1 tablet (150 mg total) by mouth every morning.     cholecalciferol 1000 UNITS tablet  Commonly known as:  VITAMIN D  Take 1,000 Units by mouth daily.     clopidogrel 75 MG tablet  Commonly known as:  PLAVIX  Take 1 tablet (75 mg total) by mouth daily.     DSS 100 MG Caps  Take 100 mg by mouth 2 (two) times daily.     Ferrous Gluconate 256 (28 FE) MG Tabs  Take 256 mg by mouth daily.     furosemide 40 MG tablet  Commonly known as:  LASIX  Take 1 tablet (40 mg total) by mouth 2 (two) times daily.     LORazepam 1 MG tablet  Commonly known as:  ATIVAN  TAKE ONE TABLET BY MOUTH EVERY 8 HOURS AS NEEDED FOR ANXIETY/SLEEP     Magnesium 250 MG Tabs  Take 250 mg by mouth daily.     metoprolol succinate 25 MG 24 hr tablet  Commonly known as:  TOPROL-XL  Take 1 tablet (25 mg total) by mouth daily. Take with or immediately following a meal.     mirabegron ER 50 MG Tb24 tablet  Commonly known as:  MYRBETRIQ  Take 1 tablet (50 mg total) by mouth daily.     mirtazapine 15 MG tablet  Commonly known as:  REMERON  Take 1  tablet (15 mg total) by mouth at bedtime.     nitroGLYCERIN 0.4 MG SL tablet  Commonly known as:  NITROSTAT  Place 1 tablet (0.4 mg total) under the tongue  every 5 (five) minutes as needed for chest pain.     pantoprazole 40 MG tablet  Commonly known as:  PROTONIX  Take 1 tablet (40 mg total) by mouth 2 (two) times daily.     pregabalin 200 MG capsule  Commonly known as:  LYRICA  Take 1 capsule (200 mg total) by mouth at bedtime.     rOPINIRole 4 MG tablet  Commonly known as:  REQUIP  Take 1 tablet (4 mg total) by mouth at bedtime.     tamsulosin 0.4 MG Caps capsule  Commonly known as:  FLOMAX  Take 1 capsule (0.4 mg total) by mouth daily.        Outstanding Labs/Studies  Echocardiogram on followup with Dr. Excell Seltzer  Duration of Discharge Encounter   Greater than 30 minutes including physician time.  Ramond Dial PA-C Pager: [phone] 04/09/2015, 4:16 PM

## 2015-04-09 NOTE — Progress Notes (Signed)
CARDIAC REHAB PHASE I   PRE:  Rate/Rhythm: 80 SR    BP: sitting 138/61    SaO2: 90 RA  MODE:  Ambulation: 450 ft   POST:  Rate/Rhythm: 115 ST    BP: sitting 122/61     SaO2: 87-91 RA  Pt SaO2 right at 90 RA in recliner. Monitored SaO2 entire walk (gait belt and RW). SaO2 lowest 87 briefly but up quickly with standing rest. Did not qualify for O2. Rest x3 which pt needs to conserve energy. Unsteady on turns with RW. To recliner, pt practicing IS, able to achieve 1250 ml.  I feel pt would benefit from one more night here before d/c to walk again and practice IS.  Encouraged pt to walk at home. He is not interested in [facility] as too far to drive.  [id number]  Elissa Lovett [place] CES, ACSM 04/09/2015 3:05 PM

## 2015-04-09 NOTE — Care Management Note (Signed)
Case Management Note  Patient Details  Name: [name] MRN: [id number] Date of Birth: 06/10/1933  Subjective/Objective:       Pt is s/p TAVR             Action/Plan:  Pt is active with [facility] and PT.  CM asked MD for resumption orders.  Agency contacted and made aware of admit and discharge.     Expected Discharge Date:                  Expected Discharge Plan:  Home w Home Health Services  In-House Referral:  Clinical Social Work  Discharge planning Services  CM Consult  Post Acute Care Choice:  Resumption of Svcs/PTA Provider Choice offered to:  Patient  DME Arranged:    DME Agency:  [facility]  HH Arranged:  RN, PT [facility] Agency:  [facility]  Status of Service:  Completed, signed off  Medicare Important Message Given:  Yes-second notification given Date Medicare IM Given:    Medicare IM give by:    Date Additional Medicare IM Given:    Additional Medicare Important Message give by:     If discussed at Long Length of Stay Meetings, dates discussed:    Additional Comments:  Cherylann Parr, RN 04/09/2015, 3:58 PM

## 2015-04-09 NOTE — Progress Notes (Signed)
Courtesy visit.  Patient doing well.  Appreciate work of TAVR team.  [name]. MD [facility]

## 2015-04-09 NOTE — Progress Notes (Signed)
04/09/2015 1200 Nursing note 3 Units Novolog administered Left arm per sliding scale for CBG 164. Scanner Broken in room, unable to chart in [facility]. IT notified of issue. Verified with Ancil Boozer RN. Will continue to monitor patient.  Flippin, Blanchard Kelch

## 2015-04-09 NOTE — Progress Notes (Signed)
    Subjective:  No chest pain or shortness of breath. Eager to go home.  Objective:  Vital Signs in the last 24 hours: Temp:  [98.2 F (36.8 C)-98.5 F (36.9 C)] 98.2 F (36.8 C) (09/16 0558) Pulse Rate:  [68-90] 90 (09/16 0900) Resp:  [18-35] 20 (09/16 0558) BP: (121-141)/(42-53) 121/53 mmHg (09/16 0900) SpO2:  [90 %-97 %] 90 % (09/16 0558) FiO2 (%):  [44 %] 44 % (09/15 2230) Weight:  [242 lb 9.6 oz (110.043 kg)] 242 lb 9.6 oz (110.043 kg) (09/16 0558)  Intake/Output from previous day: 09/15 0701 - 09/16 0700 In: 840 [P.O.:840] Out: 1260 [Urine:1260]  Physical Exam: Pt is alert and oriented, NAD HEENT: normal Neck: JVP - difficult to visualize Lungs: coarse bilaterally CV: RRR with 2/6 systolic murmur at the LSB Abd: soft, NT, Positive BS, no hepatomegaly Ext: groin sites clear bilaterally Skin: warm/dry no rash  Lab Results:  Recent Labs  04/08/15 0950 04/09/15 0536  WBC 8.4 7.4  HGB 13.1 12.2*  PLT PLATELET CLUMPS NOTED ON SMEAR, COUNT APPEARS DECREASED 79*    Recent Labs  04/08/15 0950 04/09/15 0521  NA 140 139  K 5.2* 4.2  CL 104 101  CO2 30 30  GLUCOSE 178* 116*  BUN 14 17  CREATININE 1.29* 1.26*   No results for input(s): TROPONINI in the last 72 hours.  Invalid input(s): CK, MB  Cardiac Studies: 2D Echo (Post-TAVR): Study Conclusions  - Left ventricle: The cavity size was normal. Systolic function was normal. The estimated ejection fraction was in the range of 55% to 60%. - Aortic valve: S/P placement of a 23 mm Sapien 3 valve. Normal systolic gradients and no significant peri valvular regurgitation. Valve area (VTI): 1.51 cm^2. Valve area (Vmax): 1.35 cm^2. Valve area (Vmean): 1.37 cm^2. - Mitral valve: There was mild regurgitation. Valve area by pressure half-time: 2.42 cm^2. Valve area by continuity equation (using LVOT flow): 1.36 cm^2. - Left atrium: The atrium was mildly dilated. - Atrial septum: A patent foramen  ovale cannot be excluded.  Tele: Personally reviewed: sinus rhythm  Assessment/Plan:  1. Acute on chronic diastolic heart failure, improved post-TAVR 2. Pressure ulcer, chronic, unchanged 3. Severe aortic stenosis, POD#3 from TAVR with normal valve function 4. Chronic respiratory failure: O2 sats 88% with walking - this is his baseline and he doesn't qualify for O2 at home. Suspect multifactorial with obesity/hypoventilation, aortic stenosis, and diastolic CHF 5. CKD III: stable 6. Multivessel CAD without anginal symptoms. 7. Thrombocytopenia, post-TAVR, mild  I think patient is stable for discharge. Family will be present 24 hours initially. He should stay on ASA and plavix, current med Rx (Toprol and furosemide). Needs FU next week with APP, then 30 day Valve clinic visit with me - this will be arranged. Ongoing cardiology follow-up with Dr Donnie Aho.  Tonny Bollman, M.D. 04/09/2015, 3:44 PM

## 2015-04-09 NOTE — Progress Notes (Signed)
Utilization review completed.  

## 2015-04-10 LAB — TYPE AND SCREEN
ABO/RH(D): B POS
Antibody Screen: NEGATIVE
UNIT DIVISION: 0
Unit division: 0

## 2015-04-12 ENCOUNTER — Telehealth: Payer: Self-pay | Admitting: Internal Medicine

## 2015-04-12 NOTE — Telephone Encounter (Signed)
Verba okay given.

## 2015-04-12 NOTE — Telephone Encounter (Signed)
[name] is calling for verbals on [employer] for 2x/week for the 2 weeks left in cert period. He refused PT.

## 2015-04-12 NOTE — Telephone Encounter (Signed)
BUSY SIGNAL .WILL TRY LATER ./CY 

## 2015-04-12 NOTE — Telephone Encounter (Signed)
LM TO CALL BACK ./CY 

## 2015-04-13 NOTE — Telephone Encounter (Signed)
PT RETURNED CALL  IS DOING   WELL.  HAS APPT  TOM  WILL SEE DANA ,  INFORMED  PT OF  TIME.   HAS NO COMPLAINTS AT THIS TIME .

## 2015-04-14 ENCOUNTER — Telehealth: Payer: Self-pay | Admitting: *Deleted

## 2015-04-14 ENCOUNTER — Ambulatory Visit (INDEPENDENT_AMBULATORY_CARE_PROVIDER_SITE_OTHER): Payer: Medicare Other | Admitting: Physician Assistant

## 2015-04-14 ENCOUNTER — Encounter: Payer: Self-pay | Admitting: Physician Assistant

## 2015-04-14 ENCOUNTER — Other Ambulatory Visit: Payer: Self-pay | Admitting: *Deleted

## 2015-04-14 VITALS — BP 100/50 | HR 75 | Ht 70.0 in | Wt 236.0 lb

## 2015-04-14 DIAGNOSIS — N183 Chronic kidney disease, stage 3 unspecified: Secondary | ICD-10-CM

## 2015-04-14 DIAGNOSIS — I350 Nonrheumatic aortic (valve) stenosis: Secondary | ICD-10-CM

## 2015-04-14 DIAGNOSIS — I5032 Chronic diastolic (congestive) heart failure: Secondary | ICD-10-CM

## 2015-04-14 DIAGNOSIS — I10 Essential (primary) hypertension: Secondary | ICD-10-CM

## 2015-04-14 DIAGNOSIS — D696 Thrombocytopenia, unspecified: Secondary | ICD-10-CM | POA: Diagnosis not present

## 2015-04-14 DIAGNOSIS — Z954 Presence of other heart-valve replacement: Secondary | ICD-10-CM | POA: Diagnosis not present

## 2015-04-14 DIAGNOSIS — Z952 Presence of prosthetic heart valve: Secondary | ICD-10-CM

## 2015-04-14 LAB — BASIC METABOLIC PANEL
BUN: 41 mg/dL — ABNORMAL HIGH (ref 6–23)
CALCIUM: 9.2 mg/dL (ref 8.4–10.5)
CO2: 34 mEq/L — ABNORMAL HIGH (ref 19–32)
Chloride: 102 mEq/L (ref 96–112)
Creatinine, Ser: 1.87 mg/dL — ABNORMAL HIGH (ref 0.40–1.50)
GFR: 37.09 mL/min — AB (ref >60.00)
Glucose, Bld: 102 mg/dL — ABNORMAL HIGH (ref 70–99)
POTASSIUM: 4.4 meq/L (ref 3.5–5.1)
SODIUM: 142 meq/L (ref 135–145)

## 2015-04-14 LAB — CBC WITH DIFFERENTIAL/PLATELET
BASOS ABS: 0 10*3/uL (ref 0.0–0.1)
Basophils Relative: 0.6 % (ref 0.0–3.0)
EOS PCT: 5.6 % — AB (ref 0.0–5.0)
Eosinophils Absolute: 0.4 10*3/uL (ref 0.0–0.7)
HCT: 39.7 % (ref 39.0–52.0)
HEMOGLOBIN: 12.9 g/dL — AB (ref 13.0–17.0)
Lymphocytes Relative: 21.6 % (ref 12.0–46.0)
Lymphs Abs: 1.6 10*3/uL (ref 0.7–4.0)
MCHC: 32.6 g/dL (ref 30.0–36.0)
MCV: 92.8 fl (ref 78.0–100.0)
MONOS PCT: 10.3 % (ref 3.0–12.0)
Monocytes Absolute: 0.8 10*3/uL (ref 0.1–1.0)
NEUTROS PCT: 61.9 % (ref 43.0–77.0)
Neutro Abs: 4.5 10*3/uL (ref 1.4–7.7)
Platelets: 121 10*3/uL — ABNORMAL LOW (ref 150.0–400.0)
RBC: 4.28 Mil/uL (ref 4.22–5.81)
RDW: 17 % — ABNORMAL HIGH (ref 11.5–15.5)
WBC: 7.3 10*3/uL (ref 4.0–10.5)

## 2015-04-14 LAB — BRAIN NATRIURETIC PEPTIDE: PRO B NATRI PEPTIDE: 116 pg/mL — AB (ref 0.0–100.0)

## 2015-04-14 NOTE — Telephone Encounter (Signed)
-----   Message from Laurann [place], [place] sent at 04/14/2015  1:34 PM EDT ----- He also will need a repeat BMET on Monday 04/19/15. Hopefully home health can help draw this. Dayna Dunn PA-C

## 2015-04-14 NOTE — Progress Notes (Signed)
Quick Note:  Please call patient. BNP almost near normal so not likely to be causing his dyspnea. Hgb is stable and platelet count is improving. BUN/Cr are up - will need to drop Lasix to once daily. Per discussions with home health company, they are not able to provide home health oxygen assessment because he has not had any desaturations at rest. However, I still feel he needs re-eval for home oxygen with ambulation given brief drop to 84% in clinic (quickly came up to 90%) after walking to exam room. Given their inability to perform in the home, unfortunately he'll need to return to clinic for nurse visit for ambulatory O2 assessment. Home health will be faxing over their requirements for documentation. Dayna Dunn PA-C  ______

## 2015-04-14 NOTE — Patient Instructions (Addendum)
Medication Instructions:  Your physician recommends that you continue on your current medications as directed. Please refer to the Current Medication list given to you today.    Labwork: TODAY:  BMET                CBC W/DIFF                BNP   Testing/Procedures: None ordered  Follow-Up: Your physician recommends that you keep your follow-up appointment with Dr. Excell Seltzer and Echocardiogram.   Any Other Special Instructions Will Be Listed Below (If Applicable). We have put in a new referral for Home Health to come out and do a evaluation for your Oxygen Stats.     Low-Sodium Eating Plan Sodium raises blood pressure and causes water to be held in the body. Getting less sodium from food will help lower your blood pressure, reduce any swelling, and protect your heart, liver, and kidneys. We get sodium by adding salt (sodium chloride) to food. Most of our sodium comes from canned, boxed, and frozen foods. Restaurant foods, fast foods, and pizza are also very high in sodium. Even if you take medicine to lower your blood pressure or to reduce fluid in your body, getting less sodium from your food is important. WHAT IS MY PLAN? Most people should limit their sodium intake to 2,300 mg a day. Your health care provider recommends that you limit your sodium intake to 2 grams a day.  WHAT DO I NEED TO KNOW ABOUT THIS EATING PLAN? For the low-sodium eating plan, you will follow these general guidelines:  Choose foods with a % Daily Value for sodium of less than 5% (as listed on the food label).   Use salt-free seasonings or herbs instead of table salt or sea salt.   Check with your health care provider or pharmacist before using salt substitutes.   Eat fresh foods.  Eat more vegetables and fruits.  Limit canned vegetables. If you do use them, rinse them well to decrease the sodium.   Limit cheese to 1 oz (28 g) per day.   Eat lower-sodium products, often labeled as "lower sodium"  or "no salt added."  Avoid foods that contain monosodium glutamate (MSG). MSG is sometimes added to [place] food and some canned foods.  Check food labels (Nutrition Facts labels) on foods to learn how much sodium is in one serving.  Eat more home-cooked food and less restaurant, buffet, and fast food.  When eating at a restaurant, ask that your food be prepared with less salt or none, if possible.  HOW DO I READ FOOD LABELS FOR SODIUM INFORMATION? The Nutrition Facts label lists the amount of sodium in one serving of the food. If you eat more than one serving, you must multiply the listed amount of sodium by the number of servings. Food labels may also identify foods as:  Sodium free--Less than 5 mg in a serving.  Very low sodium--35 mg or less in a serving.  Low sodium--140 mg or less in a serving.  Light in sodium--50% less sodium in a serving. For example, if a food that usually has 300 mg of sodium is changed to become light in sodium, it will have 150 mg of sodium.  Reduced sodium--25% less sodium in a serving. For example, if a food that usually has 400 mg of sodium is changed to reduced sodium, it will have 300 mg of sodium. WHAT FOODS CAN I EAT? Grains Low-sodium cereals, including oats, puffed  wheat and rice, and shredded wheat cereals. Low-sodium crackers. Unsalted rice and pasta. Lower-sodium bread.  Vegetables Frozen or fresh vegetables. Low-sodium or reduced-sodium canned vegetables. Low-sodium or reduced-sodium tomato sauce and paste. Low-sodium or reduced-sodium tomato and vegetable juices.  Fruits Fresh, frozen, and canned fruit. Fruit juice.  Meat and Other Protein Products Low-sodium canned tuna and salmon. Fresh or frozen meat, poultry, seafood, and fish. Lamb. Unsalted nuts. Dried beans, peas, and lentils without added salt. Unsalted canned beans. Homemade soups without salt. Eggs.  Dairy Milk. Soy milk. Ricotta cheese. Low-sodium or reduced-sodium  cheeses. Yogurt.  Condiments Fresh and dried herbs and spices. Salt-free seasonings. Onion and garlic powders. Low-sodium varieties of mustard and ketchup. Lemon juice.  Fats and Oils Reduced-sodium salad dressings. Unsalted butter.  Other Unsalted popcorn and pretzels.  The items listed above may not be a complete list of recommended foods or beverages. Contact your dietitian for more options. WHAT FOODS ARE NOT RECOMMENDED? Grains Instant hot cereals. Bread stuffing, pancake, and biscuit mixes. Croutons. Seasoned rice or pasta mixes. Noodle soup cups. Boxed or frozen macaroni and cheese. Self-rising flour. Regular salted crackers. Vegetables Regular canned vegetables. Regular canned tomato sauce and paste. Regular tomato and vegetable juices. Frozen vegetables in sauces. Salted french fries. Olives. Rosita Fire. Relishes. Sauerkraut. Salsa. Meat and Other Protein Products Salted, canned, smoked, spiced, or pickled meats, seafood, or fish. Bacon, ham, sausage, hot dogs, corned beef, chipped beef, and packaged luncheon meats. Salt pork. Jerky. Pickled herring. Anchovies, regular canned tuna, and sardines. Salted nuts. Dairy Processed cheese and cheese spreads. Cheese curds. Blue cheese and cottage cheese. Buttermilk.  Condiments Onion and garlic salt, seasoned salt, table salt, and sea salt. Canned and packaged gravies. Worcestershire sauce. Tartar sauce. Barbecue sauce. Teriyaki sauce. Soy sauce, including reduced sodium. Steak sauce. Fish sauce. Oyster sauce. Cocktail sauce. Horseradish. Regular ketchup and mustard. Meat flavorings and tenderizers. Bouillon cubes. Hot sauce. Tabasco sauce. Marinades. Taco seasonings. Relishes. Fats and Oils Regular salad dressings. Salted butter. Margarine. Ghee. Bacon fat.  Other Potato and tortilla chips. Corn chips and puffs. Salted popcorn and pretzels. Canned or dried soups. Pizza. Frozen entrees and pot pies.  The items listed above may not  be a complete list of foods and beverages to avoid. Contact your dietitian for more information. Document Released: 12/30/2001 Document Revised: 07/15/2013 Document Reviewed: 05/14/2013 [facility] Patient Information 2015 [place], [place]. This information is not intended to replace advice given to you by your health care provider. Make sure you discuss any questions you have with your health care provider.

## 2015-04-14 NOTE — Telephone Encounter (Signed)
Called pt per Ronie Spies, PA-C to advise him that his Creatine was still elevated and we needed to decrease his Lasix down to 1 tablet a day and recheck his labs on 04-19-15.  Pt was also advised that we needed to do his O2 evaluation here in the office, due to [facility] would not do it because he was not on O2 already.  Pt verbalized understanding and didn't have any questions at the moment and advised that he would see [place] in the morning.Marland Kitchen

## 2015-04-14 NOTE — Progress Notes (Signed)
Cardiology Office Note Date:  04/14/2015  Patient ID:  [name], [name] 09/18/1932, MRN [id number] PCP:  [name], MD  Cardiologist:  Dr. Tilley/Dr. Excell Seltzer  Chief Complaint: f/u TAVR  History of Present Illness: [name] is a 81 y.o. male with history of AS s/p recent TAVR, OSA on CPAP, TIA/CVA, carotid artery occlusion, CAD s/p PTCA of LCx and OM bifurcation 2010, h/o staph pericarditis s/p pericardial window 2007, CKD stage III, hypertension, DM and history of CAD with multiple PCI, intermittent LBBB who presents for f/u TAVR. He has been followed by Dr. Donnie Aho and more recently Dr. Excell Seltzer for TAVR. He has history of AS that has been followed. He was admitted to hospital in June for shortness of breath with minimal exertion. Echocardiogram 01/09/2015 showed EF 55-60%, grade 2 diastolic dysfunction, reduced aortic cusp separation with moderate stenosis, peak velocity 3.86 cm/s, mean gradient 32 mmHg. When compared to the previous echocardiogram, aortic stenosis had advanced. His hospitalization was complicated by coagulase-negative staph bacteremia which was treated with 4 weeks of antibiotics. He was referred to Dr. Excell Seltzer on 8/19 who felt the patient was a good candidate for TAVR. As part of pre-TAVR workup, he underwent cardiac catheterization on 03/15/2015 which showed 60% mid RCA stenosis, 40% ostial RCA stenosis, 30% ostial LM lesion, 50% ostial left circumflex lesion, 40% proximal to mid LAD. Right heart cath also supported severe aortic stenosis. He was seen by multidisciplinary valve surgery team who agreed he was appropriate for TAVR. As for his coronary artery disease, although it is significant, he was not having symptoms suggestive of angina, therefore the recommendation was to treat medically for now and percutaneously in the future should symptoms develop. He underwent TAVR on 04/06/15 and received an Edwards Sapien 3 THV (size 23 mm).He tolerated surgery well. F/u echo  04/07/2015 showed EF 55-60%, s/p placement of a 23 mm Sapien 3 valve, normal systolic gradient with no significant paravalvular regurgitation, mean valve area 1.37 cm, mild MR. He had expected post-op thrombocytopenia. He did have some desaturations with cardiac rehab during his stay (88% with walking that quickly came up) but by discharge he did not meet requirements for home O2. This was felt to be baseline for the patient and multifactorial due to obesity/hypoventilation, AS and diastolic CHF. During admission, Lasix was increased to 40mg  BID. Last BMET showed Cr 1.26, last CBC on 9/15 showed normal Hgb and platelet clumping with prior plt 93.  He comes in today feeling overall stable. He says he definitely feels better than he did before the surgery. He still gets some DOE although it's not as bad as it was before. No significant LEE, just venous stasis changes. No chest pain, dizziness or syncope. He says at home his weight has been staying around 234 and stable. Home health is coming out to follow this as well. He says they check his O2 sat regularly and the last one was 97%. In clinic today he was 84% on initial nurse check with ambulation here today and 90% on RA at rest. Gait steady. He initially reported sticking to a low sodium diet, however, quoted a typical breakfast as a sausage biscuit. He also reports drinking a lot of fluid daily.   Past Medical History  Diagnosis Date  . Gout, unspecified     severe dz, "treatment done that last 15 years"  . Obesity (BMI 30-39.9)   . Restless leg syndrome   . History of retinal detachment 1994  . Hypertensive heart  disease   . CAD (coronary artery disease), native coronary artery     a. CAD s/p PTCA of LCx and OM bifurcation 2010. b. Residual disease by cath 02/2015 - med rx for now, reserving PCI for recurrent sx.  Marland Kitchen History of pericarditis 2007    a. h/o MSSA, s/p pericardial window.  Marland Kitchen GERD   . Osteoarthritis   . Sleep apnea in adult     CPAP  qhs  . TIA (transient ischemic attack) 2005  . BPH (benign prostatic hypertrophy)     "minor"  . Lumbar spinal stenosis   . Unspecified venous (peripheral) insufficiency   . Stroke May 2003  . Anxiety   . Depression   . Aortic stenosis     a. s/p TAVR 03/2015.  . Carotid artery disease     Prior TIA 2006 with left CEA by Dr. Arbie Cookey Recurrent TIA in April 2011   . Lumbar disc disease   . CKD (chronic kidney disease), stage III 02/08/2014  . Hypertension   . Type 2 diabetes mellitus with vascular disease   . 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 Chronic diastolic CHF (congestive heart failure)   . LBBB (left bundle branch block)     a. Intermittent LBBB on EKGs.    Past Surgical History  Procedure Laterality Date  . Pericardial window  2007  . Left arm  2008    shoulder  . Right arm  1970's    shoulder  . Cataract extraction      Left side x's 2 and right  . Retinal detachment surgery      left side  . Carotid endarterectomy Left 12-06-05    cea  . Coronary angioplasty  01/2009    x 1 stent  . Colonoscopy    . Carpal tunnel release Bilateral   . Back surgery  2013    removed bone spurs  . Inguinal hernia repair Left 10/01/2013    Procedure: HERNIA REPAIR INGUINAL ADULT;  Surgeon: Axel Filler, MD;  Location: [facility] ORS;  Service: General;  Laterality: Left;  . Insertion of mesh Left 10/01/2013    Procedure: INSERTION OF MESH;  Surgeon: Axel Filler, MD;  Location: [facility] ORS;  Service: General;  Laterality: Left;  . Finger amputation  May 2015    JUST TO FIRST JOINT RIGHT HAND  LAST FINGER ( Right 5th finger)  . Peripheral vascular catheterization N/A 01/08/2015    Procedure: Abdominal Aortogram;  Surgeon: Chuck Hint, MD;  Location: [facility] INVASIVE CV LAB;  Service: Cardiovascular;  Laterality: N/A;  . Cardiac catheterization N/A 03/15/2015     Procedure: Right/Left Heart Cath and Coronary Angiography;  Surgeon: Tonny Bollman, MD;  Location: [facility] INVASIVE CV LAB;  Service: Cardiovascular;  Laterality: N/A;  . Transcatheter aortic valve replacement, transfemoral N/A 04/06/2015    Procedure: TRANSCATHETER AORTIC VALVE REPLACEMENT, TRANSFEMORAL;  Surgeon: Tonny Bollman, MD;  Location: [facility] OR;  Service: Open Heart Surgery;  Laterality: N/A;  . Tee without cardioversion N/A 04/06/2015    Procedure: TRANSESOPHAGEAL ECHOCARDIOGRAM (TEE);  Surgeon: Tonny Bollman, MD;  Location: [facility] OR;  Service: Open Heart Surgery;  Laterality: N/A;    Current Outpatient Prescriptions  Medication Sig Dispense Refill  . acetaminophen (TYLENOL) 325 MG tablet Take 650 mg by mouth every 6 (six) hours as needed for mild pain or moderate pain.    Marland Kitchen allopurinol (ZYLOPRIM)  300 MG tablet Take 1 tablet (300 mg total) by mouth every morning. 90 tablet 1  . amLODipine (NORVASC) 5 MG tablet Take 5 mg by mouth daily.     Marland Kitchen aspirin EC 81 MG tablet Take 81 mg by mouth daily.    Marland Kitchen buPROPion (WELLBUTRIN XL) 150 MG 24 hr tablet Take 1 tablet (150 mg total) by mouth every morning. 90 tablet 1  . cholecalciferol (VITAMIN D) 1000 UNITS tablet Take 1,000 Units by mouth daily.    . clopidogrel (PLAVIX) 75 MG tablet Take 1 tablet (75 mg total) by mouth daily. 90 tablet 3  . docusate sodium 100 MG CAPS Take 100 mg by mouth 2 (two) times daily. 10 capsule 0  . Ferrous Gluconate 256 (28 FE) MG TABS Take 256 mg by mouth daily.    . furosemide (LASIX) 40 MG tablet Take 1 tablet (40 mg total) by mouth 2 (two) times daily. 60 tablet 3  . LORazepam (ATIVAN) 1 MG tablet TAKE ONE TABLET BY MOUTH EVERY 8 HOURS AS NEEDED FOR ANXIETY/SLEEP 30 tablet 0  . Magnesium 250 MG TABS Take 250 mg by mouth daily.     . metoprolol succinate (TOPROL-XL) 25 MG 24 hr tablet Take 1 tablet (25 mg total) by mouth daily. Take with or immediately following a meal. 30 tablet 11  . mirabegron ER (MYRBETRIQ) 50 MG TB24  tablet Take 1 tablet (50 mg total) by mouth daily. 90 tablet 1  . mirtazapine (REMERON) 15 MG tablet Take 1 tablet (15 mg total) by mouth at bedtime. 30 tablet 11  . nitroGLYCERIN (NITROSTAT) 0.4 MG SL tablet Place 1 tablet (0.4 mg total) under the tongue every 5 (five) minutes as needed for chest pain. 25 tablet 3  . pantoprazole (PROTONIX) 40 MG tablet Take 1 tablet (40 mg total) by mouth 2 (two) times daily. 90 tablet 1  . pregabalin (LYRICA) 200 MG capsule Take 1 capsule (200 mg total) by mouth at bedtime. 90 capsule 1  . rOPINIRole (REQUIP) 4 MG tablet Take 1 tablet (4 mg total) by mouth at bedtime. 90 tablet 1  . tamsulosin (FLOMAX) 0.4 MG CAPS capsule Take 1 capsule (0.4 mg total) by mouth daily. 90 capsule 3   No current facility-administered medications for this visit.    Allergies:   Statins   Social History:  The patient  reports that he has never smoked. He has never used smokeless tobacco. He reports that he does not drink alcohol or use illicit drugs.   Family History:  The patient's family history includes Breast cancer in his sister; Cancer in his brother and sister; Diabetes in his daughter, father, and son; Heart attack in his mother; Heart disease in his father and mother; Hypertension in his son; Lung cancer in his brother; Prostate cancer in his brother.  ROS:  Please see the history of present illness. No bleeding. All other systems are reviewed and otherwise negative.   PHYSICAL EXAM:  VS:  BP 100/50 mmHg  Pulse 75  Ht  (1.778 m)  Wt 236 lb (107.049 kg)  BMI 33.86 kg/m2  SpO2 90% BMI: Body mass index is 33.86 kg/(m^2). Well nourished, well developed obese WM in no acute distress HEENT: normocephalic, atraumatic Neck: no JVD or masses Cardiac:  normal S1, S2; RRR; 2/6 SEM at LSB, no rubs or gallops Lungs:  clear to auscultation bilaterally, no wheezing, rhonchi or rales Abd: soft, nontender, no hepatomegaly, + BS MS: no deformity or atrophy Ext: no  edema;  venous stasis hyperpigmentation bilaterally, bilateral groin sites stable - right groin cut down site without dehiscence or suppuration Skin: warm and dry Neuro:  moves all extremities spontaneously, no focal abnormalities noted, follows commands Psych: euthymic mood, full affect   EKG:  Done today shows NSR 75bpm, LBBB, no acute ST-T changes  Recent Labs: 07/13/2014: Pro B Natriuretic peptide (BNP) 814.6* 07/14/2014: TSH 3.608 03/26/2015: B Natriuretic Peptide 149.3* 04/02/2015: ALT 18 04/07/2015: Magnesium 1.9 04/09/2015: BUN 17; Creatinine, Ser 1.26*; Hemoglobin 12.2*; Platelets 79*; Potassium 4.2; Sodium 139  07/15/2014: Cholesterol 190; HDL 32*; LDL Cholesterol 128*; Total CHOL/HDL Ratio 5.9; VLDL 30 01/16/2015: Triglycerides 312*   Estimated Creatinine Clearance: 57.3 mL/min (by C-G formula based on Cr of 1.26).   Wt Readings from Last 3 Encounters:  04/14/15 236 lb (107.049 kg)  04/09/15 242 lb 9.6 oz (110.043 kg)  04/02/15 230 lb (104.327 kg)     Other studies reviewed: Additional studies/records reviewed today include: summarized above  ASSESSMENT AND PLAN:  1. Aortic stenosis s/p TAVR - feeling better post-surgery. Keep f/u with Dr. Excell Seltzer with echo as scheduled 04/2015. Also has f/u scheduled with [facility] in October. 2. Thrombocytopenia (post-op) - common post-TAVR. No reported bleeding. Recheck CBC today. 3. Chronic diastolic CHF - volume appears stable on exam but he still reports some DOE. Will check BNP with labs today but otherwise weight has been relatively stable. (DC weight was 242 lb but prior weight 230.) I am hesitant to increase diuretic with his softer BP and CKD. I have asked him to pay more attention to low-sodium diet and fluid restrict to <2L per day. Not sure his level of commitment with this. In the hospital he did not qualify for home O2. In clinic today the initial check was 84% with quick recheck to 90%. Will ask home health to perform ambulatory O2 assessment  in the home setting to reassess again if he may require O2 with ambulation. 4. CKD stage III - recheck BMET today. 5. Essential hypertension - BP controlled if not borderline low, but tolerating well. If need be we can consider decreasing amlodipine if he becomes symptomatic with this.  Disposition: F/u as above.  Current medicines are reviewed at length with the patient today.  The patient did not have any concerns regarding medicines.  Thomasene Mohair PA-C 04/14/2015 10:38 AM     [facility] [address] [place] [address] [phone] (office)  [phone] (fax)

## 2015-04-15 ENCOUNTER — Ambulatory Visit (INDEPENDENT_AMBULATORY_CARE_PROVIDER_SITE_OTHER): Payer: Medicare Other

## 2015-04-15 ENCOUNTER — Other Ambulatory Visit: Payer: Self-pay | Admitting: Physician Assistant

## 2015-04-15 DIAGNOSIS — R0902 Hypoxemia: Secondary | ICD-10-CM

## 2015-04-15 NOTE — Progress Notes (Signed)
Room air oxygen saturation spot check upon patient's arrival = 90%.  Spot check after ambulating approximately 50 feet in hallway on room air = 88%.  Spot check after ambulating same distance in hallway while on 2L of oxygen via nasal cannula = 95%.

## 2015-04-15 NOTE — Progress Notes (Signed)
Patient qualified for home O2 - 2L [place] with exertion. Dayna Dunn PA-C

## 2015-04-16 DIAGNOSIS — E11622 Type 2 diabetes mellitus with other skin ulcer: Secondary | ICD-10-CM | POA: Diagnosis not present

## 2015-04-16 DIAGNOSIS — E1151 Type 2 diabetes mellitus with diabetic peripheral angiopathy without gangrene: Secondary | ICD-10-CM | POA: Diagnosis not present

## 2015-04-16 DIAGNOSIS — I509 Heart failure, unspecified: Secondary | ICD-10-CM | POA: Diagnosis not present

## 2015-04-16 DIAGNOSIS — G4730 Sleep apnea, unspecified: Secondary | ICD-10-CM | POA: Diagnosis not present

## 2015-04-16 DIAGNOSIS — L97211 Non-pressure chronic ulcer of right calf limited to breakdown of skin: Secondary | ICD-10-CM | POA: Diagnosis not present

## 2015-04-16 DIAGNOSIS — E1140 Type 2 diabetes mellitus with diabetic neuropathy, unspecified: Secondary | ICD-10-CM | POA: Diagnosis not present

## 2015-04-16 DIAGNOSIS — I2510 Atherosclerotic heart disease of native coronary artery without angina pectoris: Secondary | ICD-10-CM | POA: Diagnosis not present

## 2015-04-16 DIAGNOSIS — I10 Essential (primary) hypertension: Secondary | ICD-10-CM | POA: Diagnosis not present

## 2015-04-16 DIAGNOSIS — I252 Old myocardial infarction: Secondary | ICD-10-CM | POA: Diagnosis not present

## 2015-04-16 DIAGNOSIS — M109 Gout, unspecified: Secondary | ICD-10-CM | POA: Diagnosis not present

## 2015-04-16 DIAGNOSIS — L97511 Non-pressure chronic ulcer of other part of right foot limited to breakdown of skin: Secondary | ICD-10-CM | POA: Diagnosis not present

## 2015-04-19 ENCOUNTER — Telehealth: Payer: Self-pay | Admitting: Cardiovascular Disease

## 2015-04-19 ENCOUNTER — Ambulatory Visit: Payer: Self-pay

## 2015-04-19 ENCOUNTER — Encounter: Payer: Self-pay | Admitting: Pulmonary Disease

## 2015-04-19 NOTE — Telephone Encounter (Signed)
New Message   Home health RN wants to have Doctor send another order for labs  sent for her to do labs on another date at patient home

## 2015-04-19 NOTE — Telephone Encounter (Signed)
Pt labs were not drawn today during [facility] RN visit as pt asked the nurse not to come as he has sick family member and she is going to see pt on Wed.  Sherri, [facility] RN requested and order okay to draw labs from pt on Wed when she sees pt since original orders from 9/21 we for today.  Told her it was okay to draw labs on Wed. During visit and that I would let Dr. Randolm Idol knows know of changes to expect fax with results.

## 2015-04-21 ENCOUNTER — Telehealth: Payer: Self-pay | Admitting: Internal Medicine

## 2015-04-21 ENCOUNTER — Ambulatory Visit: Payer: Medicare Other

## 2015-04-21 NOTE — Telephone Encounter (Signed)
Seen patient today for re certification.  Is requesting orders to continue care for 2xweek for 9 weeks for  Cardio Pulmonary assessment and education and wound care.

## 2015-04-21 NOTE — Telephone Encounter (Signed)
Verbal ok given.

## 2015-04-23 ENCOUNTER — Telehealth: Payer: Self-pay | Admitting: Internal Medicine

## 2015-04-23 DIAGNOSIS — L97511 Non-pressure chronic ulcer of other part of right foot limited to breakdown of skin: Secondary | ICD-10-CM | POA: Diagnosis not present

## 2015-04-23 DIAGNOSIS — L97211 Non-pressure chronic ulcer of right calf limited to breakdown of skin: Secondary | ICD-10-CM | POA: Diagnosis not present

## 2015-04-23 DIAGNOSIS — E1151 Type 2 diabetes mellitus with diabetic peripheral angiopathy without gangrene: Secondary | ICD-10-CM | POA: Diagnosis not present

## 2015-04-23 DIAGNOSIS — E11622 Type 2 diabetes mellitus with other skin ulcer: Secondary | ICD-10-CM | POA: Diagnosis not present

## 2015-04-23 NOTE — Telephone Encounter (Signed)
Rec'd from Dr.Tilley forward 4 pages to [employer]

## 2015-04-26 ENCOUNTER — Ambulatory Visit: Payer: Medicare Other

## 2015-04-28 ENCOUNTER — Encounter: Payer: Self-pay | Admitting: Thoracic Surgery (Cardiothoracic Vascular Surgery)

## 2015-04-28 ENCOUNTER — Ambulatory Visit (INDEPENDENT_AMBULATORY_CARE_PROVIDER_SITE_OTHER): Payer: Medicare Other | Admitting: Thoracic Surgery (Cardiothoracic Vascular Surgery)

## 2015-04-28 VITALS — BP 130/64 | HR 69 | Resp 18 | Ht 70.0 in | Wt 236.0 lb

## 2015-04-28 DIAGNOSIS — Z954 Presence of other heart-valve replacement: Secondary | ICD-10-CM

## 2015-04-28 DIAGNOSIS — Z952 Presence of prosthetic heart valve: Secondary | ICD-10-CM

## 2015-04-28 DIAGNOSIS — I6523 Occlusion and stenosis of bilateral carotid arteries: Secondary | ICD-10-CM | POA: Diagnosis not present

## 2015-04-28 DIAGNOSIS — I350 Nonrheumatic aortic (valve) stenosis: Secondary | ICD-10-CM | POA: Diagnosis not present

## 2015-04-28 DIAGNOSIS — Z953 Presence of xenogenic heart valve: Secondary | ICD-10-CM

## 2015-04-28 NOTE — Progress Notes (Signed)
[facility]   MULTIDISCIPLINARY HEART VALVE CLINIC       CARDIOTHORACIC SURGERY NOTE  Referring Provider is Othella Boyer, MD PCP is [name], MD   HPI:  Patient returns for routine follow-up wound check status post transcatheter aortic valve replacement via open right transfemoral approach on 04/06/2015.  His postoperative recovery in the hospital was entirely uneventful and he was discharged home on the third postoperative day. Since hospital discharge she has continued to do very well. He was seen in follow-up by Ronie Spies at [facility] on 04/14/2015 and he is scheduled to see Dr. Excell Seltzer for routine follow-up and repeat echocardiogram in 2 weeks. He returns to our office for routine follow-up and wound check today.  [name] is doing exceptionally well. He states that he feels much better than he did prior to surgery. He still has some exertional shortness of breath, but he notes that his exercise tolerance is considerably better than it was prior to surgery. He states that he walked always in from his automobile to our office today without stopping to rest. In the past that would've not been possible. He is delighted with his progress and he has no complaints.   Current Outpatient Prescriptions  Medication Sig Dispense Refill  . acetaminophen (TYLENOL) 325 MG tablet Take 650 mg by mouth every 6 (six) hours as needed for mild pain or moderate pain.    Marland Kitchen allopurinol (ZYLOPRIM) 300 MG tablet Take 1 tablet (300 mg total) by mouth every morning. 90 tablet 1  . amLODipine (NORVASC) 5 MG tablet Take 5 mg by mouth daily.     Marland Kitchen aspirin EC 81 MG tablet Take 81 mg by mouth daily.    Marland Kitchen buPROPion (WELLBUTRIN XL) 150 MG 24 hr tablet Take 1 tablet (150 mg total) by mouth every morning. 90 tablet 1  . cholecalciferol (VITAMIN D) 1000 UNITS tablet Take 1,000 Units by mouth daily.    . clopidogrel (PLAVIX) 75 MG tablet Take 1 tablet (75 mg total) by mouth daily. 90  tablet 3  . docusate sodium 100 MG CAPS Take 100 mg by mouth 2 (two) times daily. 10 capsule 0  . Ferrous Gluconate 256 (28 FE) MG TABS Take 256 mg by mouth daily.    . furosemide (LASIX) 40 MG tablet Take 1 tablet (40 mg total) by mouth 2 (two) times daily. 60 tablet 3  . LORazepam (ATIVAN) 1 MG tablet TAKE ONE TABLET BY MOUTH EVERY 8 HOURS AS NEEDED FOR ANXIETY/SLEEP 30 tablet 0  . Magnesium 250 MG TABS Take 250 mg by mouth daily.     . metoprolol succinate (TOPROL-XL) 25 MG 24 hr tablet Take 1 tablet (25 mg total) by mouth daily. Take with or immediately following a meal. 30 tablet 11  . mirabegron ER (MYRBETRIQ) 50 MG TB24 tablet Take 1 tablet (50 mg total) by mouth daily. 90 tablet 1  . mirtazapine (REMERON) 15 MG tablet Take 1 tablet (15 mg total) by mouth at bedtime. 30 tablet 11  . nitroGLYCERIN (NITROSTAT) 0.4 MG SL tablet Place 1 tablet (0.4 mg total) under the tongue every 5 (five) minutes as needed for chest pain. 25 tablet 3  . pantoprazole (PROTONIX) 40 MG tablet Take 1 tablet (40 mg total) by mouth 2 (two) times daily. 90 tablet 1  . pregabalin (LYRICA) 200 MG capsule Take 1 capsule (200 mg total) by mouth at bedtime. 90 capsule 1  . rOPINIRole (REQUIP) 4 MG tablet Take 1  tablet (4 mg total) by mouth at bedtime. 90 tablet 1  . tamsulosin (FLOMAX) 0.4 MG CAPS capsule Take 1 capsule (0.4 mg total) by mouth daily. 90 capsule 3   No current facility-administered medications for this visit.      Physical Exam:   BP 130/64 mmHg  Pulse 69  Resp 18  Ht 5\' 10"  (1.778 m)  Wt 236 lb (107.049 kg)  BMI 33.86 kg/m2  SpO2 93%  General:  Well-appearing  Chest:   Clear to auscultation  CV:   Regular rate and rhythm with soft systolic murmur heard along left sternal border  Incisions:  Clean and dry and healing nicely  Abdomen:  Soft and nontender  Extremities:  Warm and well-perfused  Diagnostic Tests:  n/a   Impression:  Patient is doing very well approximately 3 weeks  following transcatheter aortic valve replacement via open right transfemoral approach. His groin incision is healing nicely.  Plan:  The patient will follow-up with Dr. Excell Seltzer later this month at which time he will undergo a routine follow-up echocardiogram. We have not recommended any changes to the patient's current medications at this time. The patient has been encouraged to continue to increase his physical activity without any limitations. I've encouraged him to consider enrolling in outpatient cardiac rehabilitation program.    Salvatore Decent. Cornelius Moras, MD 04/28/2015 4:36 PM

## 2015-04-28 NOTE — Patient Instructions (Addendum)
Continue all previous medications without any changes at this time  You may resume unrestricted physical activity without any particular limitations at this time.  You are encouraged to enroll and participate in the outpatient cardiac rehab program beginning as soon as practical.  Endocarditis is a potentially serious infection of heart valves or inside lining of the heart.  It occurs more commonly in patients with diseased heart valves (such as patient's with aortic or mitral valve disease) and in patients who have undergone heart valve repair or replacement.  Certain surgical and dental procedures may put you at risk, such as dental cleaning, other dental procedures, or any surgery involving the respiratory, urinary, gastrointestinal tract, gallbladder or prostate gland.   To minimize your chances for develooping endocarditis, maintain good oral health and seek prompt medical attention for any infections involving the mouth, teeth, gums, skin or urinary tract.    Always notify your doctor or dentist about your underlying heart valve condition before having any invasive procedures. You will need to take antibiotics before certain procedures, including all routine dental cleanings or other dental procedures.  Your cardiologist or dentist should prescribe these antibiotics for you to be taken ahead of time.        

## 2015-04-30 ENCOUNTER — Encounter (HOSPITAL_BASED_OUTPATIENT_CLINIC_OR_DEPARTMENT_OTHER): Payer: Medicare Other | Attending: Internal Medicine

## 2015-04-30 DIAGNOSIS — Z8673 Personal history of transient ischemic attack (TIA), and cerebral infarction without residual deficits: Secondary | ICD-10-CM | POA: Insufficient documentation

## 2015-04-30 DIAGNOSIS — L97811 Non-pressure chronic ulcer of other part of right lower leg limited to breakdown of skin: Secondary | ICD-10-CM | POA: Diagnosis not present

## 2015-04-30 DIAGNOSIS — Z952 Presence of prosthetic heart valve: Secondary | ICD-10-CM | POA: Diagnosis not present

## 2015-04-30 DIAGNOSIS — E11621 Type 2 diabetes mellitus with foot ulcer: Secondary | ICD-10-CM | POA: Insufficient documentation

## 2015-04-30 DIAGNOSIS — Z9842 Cataract extraction status, left eye: Secondary | ICD-10-CM | POA: Insufficient documentation

## 2015-04-30 DIAGNOSIS — Z9841 Cataract extraction status, right eye: Secondary | ICD-10-CM | POA: Diagnosis not present

## 2015-04-30 DIAGNOSIS — L97511 Non-pressure chronic ulcer of other part of right foot limited to breakdown of skin: Secondary | ICD-10-CM | POA: Diagnosis not present

## 2015-04-30 DIAGNOSIS — I252 Old myocardial infarction: Secondary | ICD-10-CM | POA: Insufficient documentation

## 2015-04-30 DIAGNOSIS — I110 Hypertensive heart disease with heart failure: Secondary | ICD-10-CM | POA: Insufficient documentation

## 2015-04-30 DIAGNOSIS — H269 Unspecified cataract: Secondary | ICD-10-CM | POA: Insufficient documentation

## 2015-04-30 DIAGNOSIS — I83018 Varicose veins of right lower extremity with ulcer other part of lower leg: Secondary | ICD-10-CM | POA: Diagnosis not present

## 2015-04-30 DIAGNOSIS — M109 Gout, unspecified: Secondary | ICD-10-CM | POA: Diagnosis not present

## 2015-04-30 DIAGNOSIS — E1140 Type 2 diabetes mellitus with diabetic neuropathy, unspecified: Secondary | ICD-10-CM | POA: Insufficient documentation

## 2015-04-30 DIAGNOSIS — I2510 Atherosclerotic heart disease of native coronary artery without angina pectoris: Secondary | ICD-10-CM | POA: Insufficient documentation

## 2015-04-30 DIAGNOSIS — G4730 Sleep apnea, unspecified: Secondary | ICD-10-CM | POA: Diagnosis not present

## 2015-04-30 DIAGNOSIS — E1151 Type 2 diabetes mellitus with diabetic peripheral angiopathy without gangrene: Secondary | ICD-10-CM | POA: Diagnosis not present

## 2015-04-30 DIAGNOSIS — L97821 Non-pressure chronic ulcer of other part of left lower leg limited to breakdown of skin: Secondary | ICD-10-CM | POA: Insufficient documentation

## 2015-04-30 DIAGNOSIS — I509 Heart failure, unspecified: Secondary | ICD-10-CM | POA: Insufficient documentation

## 2015-05-04 ENCOUNTER — Other Ambulatory Visit: Payer: Self-pay | Admitting: Internal Medicine

## 2015-05-07 DIAGNOSIS — I83018 Varicose veins of right lower extremity with ulcer other part of lower leg: Secondary | ICD-10-CM | POA: Diagnosis not present

## 2015-05-07 DIAGNOSIS — L97511 Non-pressure chronic ulcer of other part of right foot limited to breakdown of skin: Secondary | ICD-10-CM | POA: Diagnosis not present

## 2015-05-07 DIAGNOSIS — E11621 Type 2 diabetes mellitus with foot ulcer: Secondary | ICD-10-CM | POA: Diagnosis not present

## 2015-05-07 DIAGNOSIS — L97811 Non-pressure chronic ulcer of other part of right lower leg limited to breakdown of skin: Secondary | ICD-10-CM | POA: Diagnosis not present

## 2015-05-17 ENCOUNTER — Ambulatory Visit (INDEPENDENT_AMBULATORY_CARE_PROVIDER_SITE_OTHER): Payer: Medicare Other | Admitting: Cardiovascular Disease

## 2015-05-17 ENCOUNTER — Ambulatory Visit (HOSPITAL_COMMUNITY): Payer: Medicare Other | Attending: Internal Medicine

## 2015-05-17 ENCOUNTER — Encounter: Payer: Self-pay | Admitting: Cardiovascular Disease

## 2015-05-17 ENCOUNTER — Other Ambulatory Visit: Payer: Self-pay

## 2015-05-17 VITALS — BP 160/64 | HR 59 | Ht 70.0 in | Wt 240.8 lb

## 2015-05-17 DIAGNOSIS — I359 Nonrheumatic aortic valve disorder, unspecified: Secondary | ICD-10-CM

## 2015-05-17 DIAGNOSIS — I350 Nonrheumatic aortic (valve) stenosis: Secondary | ICD-10-CM | POA: Diagnosis not present

## 2015-05-17 DIAGNOSIS — I071 Rheumatic tricuspid insufficiency: Secondary | ICD-10-CM | POA: Diagnosis not present

## 2015-05-17 DIAGNOSIS — Z954 Presence of other heart-valve replacement: Secondary | ICD-10-CM | POA: Diagnosis present

## 2015-05-17 DIAGNOSIS — I517 Cardiomegaly: Secondary | ICD-10-CM | POA: Insufficient documentation

## 2015-05-17 DIAGNOSIS — I340 Nonrheumatic mitral (valve) insufficiency: Secondary | ICD-10-CM | POA: Insufficient documentation

## 2015-05-17 DIAGNOSIS — Z952 Presence of prosthetic heart valve: Secondary | ICD-10-CM

## 2015-05-17 DIAGNOSIS — E119 Type 2 diabetes mellitus without complications: Secondary | ICD-10-CM | POA: Diagnosis not present

## 2015-05-17 DIAGNOSIS — I2510 Atherosclerotic heart disease of native coronary artery without angina pectoris: Secondary | ICD-10-CM

## 2015-05-17 DIAGNOSIS — I6523 Occlusion and stenosis of bilateral carotid arteries: Secondary | ICD-10-CM | POA: Diagnosis not present

## 2015-05-17 DIAGNOSIS — I10 Essential (primary) hypertension: Secondary | ICD-10-CM | POA: Insufficient documentation

## 2015-05-17 NOTE — Patient Instructions (Addendum)
Medication Instructions:  Your physician recommends that you continue on your current medications as directed. Please refer to the Current Medication list given to you today.  Labwork: No new orders.   Testing/Procedures: Your physician has requested that you have an echocardiogram in 1 YEAR. Echocardiography is a painless test that uses sound waves to create images of your heart. It provides your doctor with information about the size and shape of your heart and how well your heart's chambers and valves are working. This procedure takes approximately one hour. There are no restrictions for this procedure.  Follow-Up: Your physician wants you to follow-up in: 1 YEAR with Dr Excell Seltzer.  You will receive a reminder letter in the mail two months in advance. If you don't receive a letter, please call our office to schedule the follow-up appointment.  Please continue to follow-up with Dr Donnie Aho for routine Cardiology follow-up.   Any Other Special Instructions Will Be Listed Below (If Applicable).  Your physician discussed the importance of taking an antibiotic prior to any dental, gastrointestinal, genitourinary procedures to prevent damage to the heart valves from infection. Marland Kitchen

## 2015-05-17 NOTE — Progress Notes (Signed)
Cardiology Office Note Date:  05/19/2015   ID:  [name], DOB [birth year], MRN [id number]  PCP:  [name], MD  Cardiologist:  Tonny Bollman, MD    No chief complaint on file.   History of Present Illness: [name] is a 81 y.o. male who presents for follow-up evaluation after undergoing TAVR via a right transfemoral approach 04/06/2015. He was back to all of his normal activities shortly after surgery. Here alone today.  The patient is doing fairly well. He still has some dyspnea with exertion, but states that his symptoms are improved since he has undergone TAVR. He denies orthopnea, PND, or chest pain. He's had no lightheadedness or syncope. He is tolerating his medications. He has follow-up arranged with Dr. Donnie Aho in approximately 2 months.   Past Medical History  Diagnosis Date  . Gout, unspecified     severe dz, "treatment done that last 15 years"  . Obesity (BMI 30-39.9)   . Restless leg syndrome   . History of retinal detachment 1994  . Hypertensive heart disease   . CAD (coronary artery disease), native coronary artery     a. CAD s/p PTCA of LCx and OM bifurcation 2010. b. Residual disease by cath 02/2015 - med rx for now, reserving PCI for recurrent sx.  Marland Kitchen History of pericarditis 2007    a. h/o MSSA, s/p pericardial window.  Marland Kitchen GERD   . Osteoarthritis   . Sleep apnea in adult     CPAP qhs  . TIA (transient ischemic attack) 2005  . BPH (benign prostatic hypertrophy)     "minor"  . Lumbar spinal stenosis   . Unspecified venous (peripheral) insufficiency   . Stroke [facility]) May 2003  . Anxiety   . Depression   . Aortic stenosis     a. s/p TAVR 03/2015.  . Carotid artery disease (HCC)     Prior TIA 2006 with left CEA by Dr. Arbie Cookey Recurrent TIA in April 2011   . Lumbar disc disease   . CKD (chronic kidney disease), stage III 02/08/2014  . Hypertension   . Type 2 diabetes mellitus with vascular disease (HCC)   . 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 Chronic diastolic CHF (congestive heart failure) (HCC)   . LBBB (left bundle branch block)     a. Intermittent LBBB on EKGs.    Past Surgical History  Procedure Laterality Date  . Pericardial window  2007  . Left arm  2008    shoulder  . Right arm  1970's    shoulder  . Cataract extraction      Left side x's 2 and right  . Retinal detachment surgery      left side  . Carotid endarterectomy Left 12-06-05    cea  . Coronary angioplasty  01/2009    x 1 stent  . Colonoscopy    . Carpal tunnel release Bilateral   . Back surgery  2013    removed bone spurs  . Inguinal hernia repair Left 10/01/2013    Procedure: HERNIA REPAIR INGUINAL ADULT;  Surgeon: Axel Filler, MD;  Location: [facility];  Service: General;  Laterality: Left;  . Insertion of mesh Left 10/01/2013    Procedure: INSERTION OF MESH;  Surgeon: Axel Filler, MD;  Location: [facility];  Service: General;  Laterality: Left;  . Finger amputation  May 2015  JUST TO FIRST JOINT RIGHT HAND  LAST FINGER ( Right 5th finger)  . Peripheral vascular catheterization N/A 01/08/2015    Procedure: Abdominal Aortogram;  Surgeon: Chuck Hint, MD;  Location: [facility] INVASIVE CV LAB;  Service: Cardiovascular;  Laterality: N/A;  . Cardiac catheterization N/A 03/15/2015    Procedure: Right/Left Heart Cath and Coronary Angiography;  Surgeon: Tonny Bollman, MD;  Location: [facility] INVASIVE CV LAB;  Service: Cardiovascular;  Laterality: N/A;  . Transcatheter aortic valve replacement, transfemoral N/A 04/06/2015    Procedure: TRANSCATHETER AORTIC VALVE REPLACEMENT, TRANSFEMORAL;  Surgeon: Tonny Bollman, MD;  Location: [facility] OR;  Service: Open Heart Surgery;  Laterality: N/A;  . Tee without cardioversion N/A 04/06/2015    Procedure: TRANSESOPHAGEAL ECHOCARDIOGRAM (TEE);  Surgeon: Tonny Bollman, MD;  Location: [facility] OR;  Service: Open Heart  Surgery;  Laterality: N/A;    Current Outpatient Prescriptions  Medication Sig Dispense Refill  . acetaminophen (TYLENOL) 325 MG tablet Take 650 mg by mouth every 6 (six) hours as needed for mild pain or moderate pain.    Marland Kitchen allopurinol (ZYLOPRIM) 300 MG tablet Take 1 tablet (300 mg total) by mouth every morning. 90 tablet 1  . amLODipine (NORVASC) 5 MG tablet Take 5 mg by mouth daily.     Marland Kitchen aspirin EC 81 MG tablet Take 81 mg by mouth daily.    Marland Kitchen buPROPion (WELLBUTRIN XL) 150 MG 24 hr tablet TAKE ONE TABLET BY MOUTH EVERY MORNING 90 tablet 0  . cholecalciferol (VITAMIN D) 1000 UNITS tablet Take 1,000 Units by mouth daily.    . clopidogrel (PLAVIX) 75 MG tablet Take 1 tablet (75 mg total) by mouth daily. 90 tablet 3  . docusate sodium 100 MG CAPS Take 100 mg by mouth 2 (two) times daily. 10 capsule 0  . Ferrous Gluconate 256 (28 FE) MG TABS Take 256 mg by mouth daily.    . furosemide (LASIX) 40 MG tablet Take 1 tablet (40 mg total) by mouth 2 (two) times daily. 60 tablet 3  . LORazepam (ATIVAN) 1 MG tablet TAKE ONE TABLET BY MOUTH EVERY 8 HOURS AS NEEDED FOR ANXIETY/SLEEP 30 tablet 0  . Magnesium 250 MG TABS Take 250 mg by mouth daily.     . metoprolol succinate (TOPROL-XL) 25 MG 24 hr tablet Take 1 tablet (25 mg total) by mouth daily. Take with or immediately following a meal. 30 tablet 11  . mirabegron ER (MYRBETRIQ) 50 MG TB24 tablet Take 1 tablet (50 mg total) by mouth daily. 90 tablet 1  . mirtazapine (REMERON) 15 MG tablet Take 1 tablet (15 mg total) by mouth at bedtime. 30 tablet 11  . nitroGLYCERIN (NITROSTAT) 0.4 MG SL tablet Place 0.4 mg under the tongue every 5 (five) minutes as needed for chest pain (3 doses MAX).    Marland Kitchen pantoprazole (PROTONIX) 40 MG tablet Take 1 tablet (40 mg total) by mouth 2 (two) times daily. 90 tablet 1  . pregabalin (LYRICA) 200 MG capsule Take 1 capsule (200 mg total) by mouth at bedtime. 90 capsule 1  . rOPINIRole (REQUIP) 4 MG tablet Take 1 tablet (4 mg  total) by mouth at bedtime. 90 tablet 1  . tamsulosin (FLOMAX) 0.4 MG CAPS capsule Take 1 capsule (0.4 mg total) by mouth daily. 90 capsule 3   No current facility-administered medications for this visit.    Allergies:   Statins   Social History:  The patient  reports that he has never smoked. He has never used smokeless tobacco. He  reports that he does not drink alcohol or use illicit drugs.   Family History:  The patient's  family history includes Breast cancer in his sister; Cancer in his brother and sister; Diabetes in his daughter, father, and son; Heart attack in his mother; Heart disease in his father and mother; Hypertension in his son; Lung cancer in his brother; Prostate cancer in his brother.    ROS:  Please see the history of present illness.  All other systems are reviewed and negative.    PHYSICAL EXAM: VS:  BP 160/64 mmHg  Pulse 59  Ht 5\' 10"  (1.778 m)  Wt 240 lb 12.8 oz (109.226 kg)  BMI 34.55 kg/m2 , BMI Body mass index is 34.55 kg/(m^2). GEN: Well nourished, well developed, in no acute distress HEENT: normal Neck: no JVD, no masses.  Cardiac: RRR with  Grade 2/6 systolic ejection murmur at the right upper sternal border              Respiratory:  clear to auscultation bilaterally, normal work of breathing GI: soft, nontender, nondistended, + BS MS: no deformity or atrophy Ext:  There are chronic stasis changes of the right lower extremity greater than left , 1+ right pretibial edema , trace on the left. Neuro:  Strength and sensation are intact Psych: euthymic mood, full affect  EKG:  EKG is ordered today. The ekg ordered today shows  Sinus rhythm 59 bpm , nonspecific IVCD. Baseline artifact makes it difficult to determine rhythm.  Recent Labs: 07/14/2014: TSH 3.608 03/26/2015: B Natriuretic Peptide 149.3* 04/02/2015: ALT 18 04/07/2015: Magnesium 1.9 04/14/2015: BUN 41*; Creatinine, Ser 1.87*; Hemoglobin 12.9*; Platelets 121.0*; Potassium 4.4; Pro B Natriuretic  peptide (BNP) 116.0*; Sodium 142   Lipid Panel     Component Value Date/Time   CHOL 190 07/15/2014 0325   TRIG 312* 01/16/2015 1157   TRIG 154 04/13/2010   HDL 32* 07/15/2014 0325   CHOLHDL 5.9 07/15/2014 0325   VLDL 30 07/15/2014 0325   LDLCALC 128* 07/15/2014 0325   LDLDIRECT 134.4 02/06/2013 1126      Wt Readings from Last 3 Encounters:  05/17/15 240 lb 12.8 oz (109.226 kg)  04/28/15 236 lb (107.049 kg)  04/14/15 236 lb (107.049 kg)     ASSESSMENT AND PLAN: Severe aortic stenosis status post TAVR: patient with NYHA functional class II symptoms. He is tolerating dual antiplatelet therapy with aspirin and Plavix. I reinforced the need for SBE prophylaxis when indicated. I personally reviewed the patient's echo images. The formal report is currently pending. His mean and peak transvalvular gradients are 18 and 33, respectively. There is no paravalvular aortic insufficiency and his LV function appears normal.   The patient will continue cardiac follow-up with Dr. Donnie Aho. I will see him back for a valve clinic appointment at one year with an echocardiogram.  Current medicines are reviewed with the patient today.  The patient does not have concerns regarding medicines.  Labs/ tests ordered today include:   Orders Placed This Encounter  Procedures  . EKG 12-Lead  . Echocardiogram    Signed, Tonny Bollman, MD  05/19/2015 8:57 AM    [facility] [address] [place], [place], [place]  [address] Phone: [phone]; Fax: [phone]

## 2015-05-19 ENCOUNTER — Other Ambulatory Visit: Payer: Self-pay

## 2015-05-19 ENCOUNTER — Encounter (HOSPITAL_COMMUNITY): Payer: Self-pay

## 2015-05-19 ENCOUNTER — Emergency Department (HOSPITAL_COMMUNITY): Payer: Medicare Other

## 2015-05-19 ENCOUNTER — Observation Stay (HOSPITAL_COMMUNITY)
Admission: EM | Admit: 2015-05-19 | Discharge: 2015-05-20 | Disposition: A | Discharge status: Home / Self Care | Payer: Medicare Other | Attending: Cardiology | Admitting: Cardiology

## 2015-05-19 DIAGNOSIS — I2510 Atherosclerotic heart disease of native coronary artery without angina pectoris: Secondary | ICD-10-CM | POA: Diagnosis not present

## 2015-05-19 DIAGNOSIS — I5032 Chronic diastolic (congestive) heart failure: Secondary | ICD-10-CM | POA: Diagnosis not present

## 2015-05-19 DIAGNOSIS — R109 Unspecified abdominal pain: Secondary | ICD-10-CM

## 2015-05-19 DIAGNOSIS — I872 Venous insufficiency (chronic) (peripheral): Secondary | ICD-10-CM | POA: Insufficient documentation

## 2015-05-19 DIAGNOSIS — I252 Old myocardial infarction: Secondary | ICD-10-CM | POA: Diagnosis not present

## 2015-05-19 DIAGNOSIS — K8050 Calculus of bile duct without cholangitis or cholecystitis without obstruction: Secondary | ICD-10-CM | POA: Diagnosis not present

## 2015-05-19 DIAGNOSIS — Z955 Presence of coronary angioplasty implant and graft: Secondary | ICD-10-CM | POA: Diagnosis not present

## 2015-05-19 DIAGNOSIS — Z79899 Other long term (current) drug therapy: Secondary | ICD-10-CM | POA: Diagnosis not present

## 2015-05-19 DIAGNOSIS — Z9981 Dependence on supplemental oxygen: Secondary | ICD-10-CM | POA: Insufficient documentation

## 2015-05-19 DIAGNOSIS — I5030 Unspecified diastolic (congestive) heart failure: Secondary | ICD-10-CM | POA: Diagnosis not present

## 2015-05-19 DIAGNOSIS — E669 Obesity, unspecified: Secondary | ICD-10-CM | POA: Diagnosis not present

## 2015-05-19 DIAGNOSIS — G4733 Obstructive sleep apnea (adult) (pediatric): Secondary | ICD-10-CM | POA: Diagnosis not present

## 2015-05-19 DIAGNOSIS — J9610 Chronic respiratory failure, unspecified whether with hypoxia or hypercapnia: Secondary | ICD-10-CM | POA: Diagnosis not present

## 2015-05-19 DIAGNOSIS — I1310 Hypertensive heart and chronic kidney disease without heart failure, with stage 1 through stage 4 chronic kidney disease, or unspecified chronic kidney disease: Secondary | ICD-10-CM | POA: Insufficient documentation

## 2015-05-19 DIAGNOSIS — N183 Chronic kidney disease, stage 3 (moderate): Secondary | ICD-10-CM | POA: Diagnosis not present

## 2015-05-19 DIAGNOSIS — I359 Nonrheumatic aortic valve disorder, unspecified: Secondary | ICD-10-CM | POA: Diagnosis present

## 2015-05-19 DIAGNOSIS — Z8673 Personal history of transient ischemic attack (TIA), and cerebral infarction without residual deficits: Secondary | ICD-10-CM | POA: Insufficient documentation

## 2015-05-19 DIAGNOSIS — Z7982 Long term (current) use of aspirin: Secondary | ICD-10-CM | POA: Diagnosis not present

## 2015-05-19 DIAGNOSIS — E1151 Type 2 diabetes mellitus with diabetic peripheral angiopathy without gangrene: Secondary | ICD-10-CM | POA: Diagnosis not present

## 2015-05-19 DIAGNOSIS — E785 Hyperlipidemia, unspecified: Secondary | ICD-10-CM | POA: Insufficient documentation

## 2015-05-19 DIAGNOSIS — E1159 Type 2 diabetes mellitus with other circulatory complications: Secondary | ICD-10-CM | POA: Diagnosis present

## 2015-05-19 DIAGNOSIS — K029 Dental caries, unspecified: Secondary | ICD-10-CM | POA: Diagnosis not present

## 2015-05-19 HISTORY — DX: Personal history of other diseases of the musculoskeletal system and connective tissue: Z87.39

## 2015-05-19 HISTORY — DX: Acute respiratory failure with hypoxia: J96.01

## 2015-05-19 HISTORY — DX: Acute myocardial infarction, unspecified: I21.9

## 2015-05-19 HISTORY — DX: Other specified health status: Z78.9

## 2015-05-19 HISTORY — DX: Dependence on other enabling machines and devices: Z99.89

## 2015-05-19 HISTORY — DX: Obstructive sleep apnea (adult) (pediatric): G47.33

## 2015-05-19 HISTORY — DX: Pneumonia, unspecified organism: J18.9

## 2015-05-19 LAB — COMPREHENSIVE METABOLIC PANEL
ALT: 72 U/L — AB (ref 17–63)
AST: 155 U/L — AB (ref 15–41)
Albumin: 3.4 g/dL — ABNORMAL LOW (ref 3.5–5.0)
Alkaline Phosphatase: 108 U/L (ref 38–126)
Anion gap: 7 (ref 5–15)
BILIRUBIN TOTAL: 1.4 mg/dL — AB (ref 0.3–1.2)
BUN: 32 mg/dL — AB (ref 6–20)
CO2: 30 mmol/L (ref 22–32)
CREATININE: 1.59 mg/dL — AB (ref 0.61–1.24)
Calcium: 8.9 mg/dL (ref 8.9–10.3)
Chloride: 103 mmol/L (ref 101–111)
GFR calc Af Amer: 46 mL/min — ABNORMAL LOW (ref >60)
GFR, EST NON AFRICAN AMERICAN: 39 mL/min — AB (ref >60)
Glucose, Bld: 128 mg/dL — ABNORMAL HIGH (ref 65–99)
Potassium: 4.5 mmol/L (ref 3.5–5.1)
Sodium: 140 mmol/L (ref 135–145)
TOTAL PROTEIN: 6.6 g/dL (ref 6.5–8.1)

## 2015-05-19 LAB — HEPATIC FUNCTION PANEL
ALT: 37 U/L (ref 17–63)
AST: 84 U/L — AB (ref 15–41)
Albumin: 3.3 g/dL — ABNORMAL LOW (ref 3.5–5.0)
Alkaline Phosphatase: 94 U/L (ref 38–126)
BILIRUBIN INDIRECT: 0.4 mg/dL (ref 0.3–0.9)
Bilirubin, Direct: 0.3 mg/dL (ref 0.1–0.5)
TOTAL PROTEIN: 6.6 g/dL (ref 6.5–8.1)
Total Bilirubin: 0.7 mg/dL (ref 0.3–1.2)

## 2015-05-19 LAB — BASIC METABOLIC PANEL
ANION GAP: 11 (ref 5–15)
BUN: 31 mg/dL — ABNORMAL HIGH (ref 6–20)
CALCIUM: 9 mg/dL (ref 8.9–10.3)
CO2: 27 mmol/L (ref 22–32)
Chloride: 105 mmol/L (ref 101–111)
Creatinine, Ser: 1.69 mg/dL — ABNORMAL HIGH (ref 0.61–1.24)
GFR, EST AFRICAN AMERICAN: 42 mL/min — AB (ref >60)
GFR, EST NON AFRICAN AMERICAN: 37 mL/min — AB (ref >60)
Glucose, Bld: 129 mg/dL — ABNORMAL HIGH (ref 65–99)
Potassium: 4.4 mmol/L (ref 3.5–5.1)
SODIUM: 143 mmol/L (ref 135–145)

## 2015-05-19 LAB — CBC
HCT: 43.1 % (ref 39.0–52.0)
HCT: 42.8 % (ref 39.0–52.0)
HEMOGLOBIN: 13.7 g/dL (ref 13.0–17.0)
Hemoglobin: 13.6 g/dL (ref 13.0–17.0)
MCH: 30 pg (ref 26.0–34.0)
MCH: 30.2 pg (ref 26.0–34.0)
MCHC: 31.8 g/dL (ref 30.0–36.0)
MCHC: 31.8 g/dL (ref 30.0–36.0)
MCV: 94.3 fL (ref 78.0–100.0)
MCV: 94.9 fL (ref 78.0–100.0)
PLATELETS: 84 10*3/uL — AB (ref 150–400)
Platelets: DECREASED 10*3/uL (ref 150–400)
RBC: 4.57 MIL/uL (ref 4.22–5.81)
RBC: 4.51 MIL/uL (ref 4.22–5.81)
RDW: 15 % (ref 11.5–15.5)
RDW: 14.9 % (ref 11.5–15.5)
WBC: 5.5 10*3/uL (ref 4.0–10.5)
WBC: 4.9 10*3/uL (ref 4.0–10.5)

## 2015-05-19 LAB — BRAIN NATRIURETIC PEPTIDE: B NATRIURETIC PEPTIDE 5: 67.6 pg/mL (ref 0.0–100.0)

## 2015-05-19 LAB — I-STAT TROPONIN, ED: Troponin i, poc: 0.01 ng/mL (ref 0.00–0.08)

## 2015-05-19 LAB — GLUCOSE, CAPILLARY: Glucose-Capillary: 162 mg/dL — ABNORMAL HIGH (ref 65–99)

## 2015-05-19 LAB — TROPONIN I

## 2015-05-19 MED ORDER — ALLOPURINOL 300 MG PO TABS
300.0000 mg | ORAL_TABLET | Freq: Every morning | ORAL | Status: DC
  Administered 2015-05-20: 300 mg via ORAL
  Filled 2015-05-19: qty 1

## 2015-05-19 MED ORDER — TAMSULOSIN HCL 0.4 MG PO CAPS
0.4000 mg | ORAL_CAPSULE | Freq: Every day | ORAL | Status: DC
Start: 2015-05-19 — End: 2015-05-20
  Administered 2015-05-20: 0.4 mg via ORAL
  Filled 2015-05-19: qty 1

## 2015-05-19 MED ORDER — NITROGLYCERIN 0.4 MG SL SUBL: 0.4000 mg | SUBLINGUAL_TABLET | SUBLINGUAL | Status: DC | PRN

## 2015-05-19 MED ORDER — NITROGLYCERIN 2 % TD OINT
1.0000 [in_us] | TOPICAL_OINTMENT | Freq: Once | TRANSDERMAL | Status: AC
  Administered 2015-05-19: 1 [in_us] via TOPICAL
  Filled 2015-05-19: qty 1

## 2015-05-19 MED ORDER — PREGABALIN 25 MG PO CAPS
200.0000 mg | ORAL_CAPSULE | Freq: Every day | ORAL | Status: DC
  Administered 2015-05-19: 200 mg via ORAL
  Filled 2015-05-19 (×2): qty 2

## 2015-05-19 MED ORDER — MAGNESIUM 250 MG PO TABS: 250.0000 mg | ORAL_TABLET | Freq: Every day | ORAL | Status: DC

## 2015-05-19 MED ORDER — MIRABEGRON ER 25 MG PO TB24
50.0000 mg | ORAL_TABLET | Freq: Every day | ORAL | Status: DC
  Administered 2015-05-20: 50 mg via ORAL
  Filled 2015-05-19: qty 2

## 2015-05-19 MED ORDER — FERROUS GLUCONATE 324 (38 FE) MG PO TABS
324.0000 mg | ORAL_TABLET | Freq: Every day | ORAL | Status: DC
Start: 2015-05-20 — End: 2015-05-20
  Administered 2015-05-20: 324 mg via ORAL
  Filled 2015-05-19: qty 1

## 2015-05-19 MED ORDER — MIRTAZAPINE 15 MG PO TABS
15.0000 mg | ORAL_TABLET | Freq: Every day | ORAL | Status: DC
  Administered 2015-05-19: 15 mg via ORAL
  Filled 2015-05-19 (×2): qty 1

## 2015-05-19 MED ORDER — ENOXAPARIN SODIUM 40 MG/0.4ML ~~LOC~~ SOLN: 40.0000 mg | SUBCUTANEOUS | Status: DC

## 2015-05-19 MED ORDER — HYDROCODONE-ACETAMINOPHEN 5-325 MG PO TABS
1.0000 | ORAL_TABLET | Freq: Once | ORAL | Status: AC
  Administered 2015-05-19: 1 via ORAL
  Filled 2015-05-19: qty 1

## 2015-05-19 MED ORDER — ASPIRIN EC 81 MG PO TBEC
81.0000 mg | DELAYED_RELEASE_TABLET | Freq: Every day | ORAL | Status: DC
  Administered 2015-05-20: 81 mg via ORAL
  Filled 2015-05-19: qty 1

## 2015-05-19 MED ORDER — INSULIN ASPART 100 UNIT/ML ~~LOC~~ SOLN
0.0000 [IU] | Freq: Three times a day (TID) | SUBCUTANEOUS | Status: DC
  Administered 2015-05-20: 2 [IU] via SUBCUTANEOUS

## 2015-05-19 MED ORDER — CLOPIDOGREL BISULFATE 75 MG PO TABS
75.0000 mg | ORAL_TABLET | Freq: Every day | ORAL | Status: DC
Start: 2015-05-19 — End: 2015-05-20
  Administered 2015-05-20: 75 mg via ORAL
  Filled 2015-05-19: qty 1

## 2015-05-19 MED ORDER — FERROUS GLUCONATE 256 (28 FE) MG PO TABS: 256.0000 mg | ORAL_TABLET | Freq: Every day | ORAL | Status: DC

## 2015-05-19 MED ORDER — ONDANSETRON HCL 4 MG/2ML IJ SOLN: 4.0000 mg | Freq: Four times a day (QID) | INTRAMUSCULAR | Status: DC | PRN

## 2015-05-19 MED ORDER — ACETAMINOPHEN 325 MG PO TABS
650.0000 mg | ORAL_TABLET | ORAL | Status: DC | PRN
  Administered 2015-05-20: 650 mg via ORAL
  Filled 2015-05-19: qty 2

## 2015-05-19 MED ORDER — ROPINIROLE HCL 1 MG PO TABS
4.0000 mg | ORAL_TABLET | Freq: Every day | ORAL | Status: DC
  Administered 2015-05-19: 4 mg via ORAL
  Filled 2015-05-19: qty 4

## 2015-05-19 MED ORDER — METOPROLOL SUCCINATE ER 25 MG PO TB24
25.0000 mg | ORAL_TABLET | Freq: Every day | ORAL | Status: DC
  Administered 2015-05-20: 25 mg via ORAL
  Filled 2015-05-19: qty 1

## 2015-05-19 MED ORDER — BUPROPION HCL ER (XL) 150 MG PO TB24
150.0000 mg | ORAL_TABLET | Freq: Every morning | ORAL | Status: DC
  Administered 2015-05-20: 150 mg via ORAL
  Filled 2015-05-19: qty 1

## 2015-05-19 MED ORDER — ENOXAPARIN SODIUM 40 MG/0.4ML ~~LOC~~ SOLN
40.0000 mg | SUBCUTANEOUS | Status: DC
  Administered 2015-05-19: 40 mg via SUBCUTANEOUS
  Filled 2015-05-19: qty 0.4

## 2015-05-19 MED ORDER — PANTOPRAZOLE SODIUM 40 MG PO TBEC
40.0000 mg | DELAYED_RELEASE_TABLET | Freq: Two times a day (BID) | ORAL | Status: DC
  Administered 2015-05-19 – 2015-05-20 (×2): 40 mg via ORAL
  Filled 2015-05-19 (×2): qty 1

## 2015-05-19 MED ORDER — AMLODIPINE BESYLATE 5 MG PO TABS
5.0000 mg | ORAL_TABLET | Freq: Every day | ORAL | Status: DC
  Administered 2015-05-20: 5 mg via ORAL
  Filled 2015-05-19: qty 1

## 2015-05-19 MED ORDER — MAGNESIUM GLUCONATE 500 MG PO TABS
250.0000 mg | ORAL_TABLET | Freq: Every day | ORAL | Status: DC
  Administered 2015-05-19 – 2015-05-20 (×2): 250 mg via ORAL
  Filled 2015-05-19 (×2): qty 1

## 2015-05-19 MED ORDER — DOCUSATE SODIUM 100 MG PO CAPS
100.0000 mg | ORAL_CAPSULE | Freq: Two times a day (BID) | ORAL | Status: DC
  Administered 2015-05-19 – 2015-05-20 (×2): 100 mg via ORAL
  Filled 2015-05-19 (×2): qty 1

## 2015-05-19 MED ORDER — FUROSEMIDE 40 MG PO TABS
40.0000 mg | ORAL_TABLET | Freq: Two times a day (BID) | ORAL | Status: DC
  Administered 2015-05-20: 40 mg via ORAL
  Filled 2015-05-19: qty 1

## 2015-05-19 MED ORDER — VITAMIN D 1000 UNITS PO TABS
1000.0000 [IU] | ORAL_TABLET | Freq: Every day | ORAL | Status: DC
  Administered 2015-05-20: 1000 [IU] via ORAL
  Filled 2015-05-19: qty 1

## 2015-05-19 NOTE — H&P (Addendum)
History and Physical   Admit date: 05/19/2015 Name:  [name] Medical record number: [id number] DOB/Age:  [birth year]  81 y.o. male  Referring Physician:   [facility]  Primary Cardiologist: Donnie Aho   Primary Physician:   Dr. [name]  Chief complaint/reason for admission: Epigastric pain   HPI:  This 81 year old male underwent TAVR for severe aortic stenosis September 13 of 2016.  He did quite well with this but prior to this and had multiple admissions for respiratory failure.  He was placed on oxygen at home for desaturation but had clinically done better and had largely been staying by himself with his family staying with him just a little bit.  He continues complain of mild dyspnea with exertion but notes significant improvement in his dyspnea since the procedure.  He is due to have his teeth removed soon for significant dental caries.  He was seen 2 days ago by Dr. Excell Seltzer and felt to be doing well although the one month echo did show some increase in his aortic valve gradient.  I reviewed the echo and it appeared to show some density at the inferior surface of the valve.  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  He is predominantly limited with low back pain.  He went out to breakfast this morning and had a sausage biscuit and about an hour later developed fairly severe midepigastric pain.  The pain was not in his mid sternal chest area that was severe and lasted around an hour.  EMS was called and he was transported here.  The pain has spontaneously improved.  Labs were remarkable for an elevation of his AST has never been elevated before on previous labs including one done on September 9.  He is currently pain-free and feels better.  He denies PND, orthopnea or claudication in lower has a trace amount of edema.   Past Medical History  Diagnosis Date  . Gout, unspecified     severe dz, "treatment done that last 15 years"  . Obesity (BMI 30-39.9)   .  Restless leg syndrome   . History of retinal detachment 1994  . Hypertensive heart disease   . CAD (coronary artery disease), native coronary artery     a. CAD s/p PTCA of LCx and OM bifurcation 2010. b. Residual disease by cath 02/2015 - med rx for now, reserving PCI for recurrent sx.  Marland Kitchen History of pericarditis 2007    a. h/o MSSA, s/p pericardial window.  Marland Kitchen GERD   . Osteoarthritis   . Sleep apnea in adult     CPAP qhs  . TIA (transient ischemic attack) 2005  . BPH (benign prostatic hypertrophy)     "minor"  . Lumbar spinal stenosis   . Unspecified venous (peripheral) insufficiency   . Stroke [facility]) May 2003  . Anxiety   . Depression   . Aortic stenosis     a. s/p TAVR 03/2015.  . Carotid artery disease (HCC)     Prior TIA 2006 with left CEA by Dr. Arbie Cookey Recurrent TIA in April 2011   . Lumbar disc disease   . CKD (chronic kidney disease), stage III 02/08/2014  . Hypertension   . Type 2 diabetes mellitus with vascular disease (HCC)   . 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 Chronic diastolic CHF (congestive heart failure) (HCC)   . LBBB (left  bundle branch block)     a. Intermittent LBBB on EKGs.     Past Surgical History  Procedure Laterality Date  . Pericardial window  2007  . Left arm  2008    shoulder  . Right arm  1970's    shoulder  . Cataract extraction      Left side x's 2 and right  . Retinal detachment surgery      left side  . Carotid endarterectomy Left 12-06-05    cea  . Coronary angioplasty  01/2009    x 1 stent  . Colonoscopy    . Carpal tunnel release Bilateral   . Back surgery  2013    removed bone spurs  . Inguinal hernia repair Left 10/01/2013    Procedure: HERNIA REPAIR INGUINAL ADULT;  Surgeon: Axel Filler, MD;  Location: [facility];  Service: General;  Laterality: Left;  . Insertion of mesh Left 10/01/2013    Procedure: INSERTION  OF MESH;  Surgeon: Axel Filler, MD;  Location: [facility];  Service: General;  Laterality: Left;  . Finger amputation  May 2015    JUST TO FIRST JOINT RIGHT HAND  LAST FINGER ( Right 5th finger)  . Peripheral vascular catheterization N/A 01/08/2015    Procedure: Abdominal Aortogram;  Surgeon: Chuck Hint, MD;  Location: [facility] INVASIVE CV LAB;  Service: Cardiovascular;  Laterality: N/A;  . Cardiac catheterization N/A 03/15/2015    Procedure: Right/Left Heart Cath and Coronary Angiography;  Surgeon: Tonny Bollman, MD;  Location: [facility] INVASIVE CV LAB;  Service: Cardiovascular;  Laterality: N/A;  . Transcatheter aortic valve replacement, transfemoral N/A 04/06/2015    Procedure: TRANSCATHETER AORTIC VALVE REPLACEMENT, TRANSFEMORAL;  Surgeon: Tonny Bollman, MD;  Location: [facility] OR;  Service: Open Heart Surgery;  Laterality: N/A;  . Tee without cardioversion N/A 04/06/2015    Procedure: TRANSESOPHAGEAL ECHOCARDIOGRAM (TEE);  Surgeon: Tonny Bollman, MD;  Location: [facility] OR;  Service: Open Heart Surgery;  Laterality: N/A;   Allergies: is allergic to statins.   Medications: Prior to Admission medications   Medication Sig Start Date End Date Taking? Authorizing Provider  acetaminophen (TYLENOL) 325 MG tablet Take 650 mg by mouth every 6 (six) hours as needed for mild pain or moderate pain.   Yes Historical Provider, MD  allopurinol (ZYLOPRIM) 300 MG tablet Take 1 tablet (300 mg total) by mouth every morning. 10/06/14  Yes Newt Lukes, MD  amLODipine (NORVASC) 5 MG tablet Take 5 mg by mouth daily.  03/08/15  Yes Historical Provider, MD  aspirin EC 81 MG tablet Take 81 mg by mouth daily.   Yes Historical Provider, MD  buPROPion (WELLBUTRIN XL) 150 MG 24 hr tablet TAKE ONE TABLET BY MOUTH EVERY MORNING 05/04/15  Yes Newt Lukes, MD  cholecalciferol (VITAMIN D) 1000 UNITS tablet Take 1,000 Units by mouth daily.   Yes Historical Provider, MD  clopidogrel (PLAVIX) 75 MG tablet Take 1 tablet (75 mg  total) by mouth daily. 11/11/14  Yes Newt Lukes, MD  docusate sodium 100 MG CAPS Take 100 mg by mouth 2 (two) times daily. 07/16/14  Yes Ripudeep Jenna Luo, MD  Ferrous Gluconate 256 (28 FE) MG TABS Take 256 mg by mouth daily.   Yes Historical Provider, MD  furosemide (LASIX) 40 MG tablet Take 1 tablet (40 mg total) by mouth 2 (two) times daily. 04/09/15  Yes Azalee Course, PA  LORazepam (ATIVAN) 1 MG tablet TAKE ONE TABLET BY MOUTH EVERY 8 HOURS AS NEEDED FOR ANXIETY/SLEEP 03/23/15  Yes Etta Grandchild, MD  Magnesium 250 MG TABS Take 250 mg by mouth daily.    Yes Historical Provider, MD  metoprolol succinate (TOPROL-XL) 25 MG 24 hr tablet Take 1 tablet (25 mg total) by mouth daily. Take with or immediately following a meal. 04/09/15  Yes Azalee Course, PA  mirabegron ER (MYRBETRIQ) 50 MG TB24 tablet Take 1 tablet (50 mg total) by mouth daily. 10/06/14  Yes Newt Lukes, MD  mirtazapine (REMERON) 15 MG tablet Take 1 tablet (15 mg total) by mouth at bedtime. 09/10/14  Yes Newt Lukes, MD  nitroGLYCERIN (NITROSTAT) 0.4 MG SL tablet Place 0.4 mg under the tongue every 5 (five) minutes as needed for chest pain (3 doses MAX).   Yes Historical Provider, MD  pantoprazole (PROTONIX) 40 MG tablet Take 1 tablet (40 mg total) by mouth 2 (two) times daily. 10/06/14  Yes Newt Lukes, MD  pregabalin (LYRICA) 200 MG capsule Take 1 capsule (200 mg total) by mouth at bedtime. 12/15/14  Yes Newt Lukes, MD  rOPINIRole (REQUIP) 4 MG tablet Take 1 tablet (4 mg total) by mouth at bedtime. 10/06/14  Yes Newt Lukes, MD  tamsulosin (FLOMAX) 0.4 MG CAPS capsule Take 1 capsule (0.4 mg total) by mouth daily. 11/11/14  Yes Newt Lukes, MD   Family History:  Family Status  Relation Status Death Age  . Mother Deceased 74    Heart disease  . Father Deceased 65    Heart disease  . Son Alive   . Maternal Grandmother Deceased   . Maternal Grandfather Deceased   . Paternal Grandmother Deceased   .  Paternal Grandfather Deceased    Social History:   reports that he has never smoked. He has never used smokeless tobacco. He reports that he does not drink alcohol or use illicit drugs.   Social History   Social History Narrative   Lives alone     Review of Systems: He has been having arthritis in his shoulders at all has also has significant chronic back pain and hip pain that limits his activity.  He has been seen in the wound clinic for some ulcers on his lower extremities.  He has urinary frequency as well as hesitant seeing.  He doesn't have any prior gallbladder disease or abdominal pain in the past.    Other than as noted above, the remainder of the review of systems is normal  Physical Exam: BP 143/49 mmHg  Pulse 73  Temp(Src) 97.5 F (36.4 C) (Oral)  Resp 18  SpO2 98% General appearance: Pleasant obese male mildly plethoric in no acute distress Head: Normocephalic, without obvious abnormality, atraumatic Eyes: conjunctivae/corneas clear. PERRL, EOM's intact. Fundi benign. Neck: no adenopathy, no carotid bruit, no JVD and supple, symmetrical, trachea midline Lungs: clear to auscultation bilaterally Heart: Regular rate and rhythm, normal S1 and S2, no S3, 1 to 2/6 systolic ejection murmur lower left sternal border Abdomen: soft, non-tender; bowel sounds normal; no masses,  no organomegaly and Somewhat distended Rectal: deferred Extremities: Trace edema of the right lower extremity, healed excoriations over the lower legs, mild changes of chronic venous insufficiency Pulses: Distal pulses are 1+, femoral pulses 2+ with healed scar Neurologic: Grossly normal  Labs: CBC  Recent Labs  05/19/15 1431  WBC 5.5  RBC 4.57  HGB 13.7  HCT 43.1  PLT PLATELET CLUMPS NOTED ON SMEAR, COUNT APPEARS DECREASED  MCV 94.3  MCH 30.0  MCHC 31.8  RDW 15.0  CMP   Recent Labs  05/19/15 1431 05/19/15 1432  NA 143  --   K 4.4  --   CL 105  --   CO2 27  --   GLUCOSE 129*  --    BUN 31*  --   CREATININE 1.69*  --   CALCIUM 9.0  --   PROT  --  6.6  ALBUMIN  --  3.3*  AST  --  84*  ALT  --  37  ALKPHOS  --  94  BILITOT  --  0.7  GFRNONAA 37*  --   GFRAA 42*  --    BNP (last 3 results)  Recent Labs  07/10/14 0801 07/13/14 1928 04/14/15 1057  PROBNP 422.7 814.6* 116.0*   Cardiac Panel (last 3 results) Troponin (Point of Care Test)  Recent Labs  05/19/15 1431  TROPIPOC 0.01   Thyroid  Lab Results  Component Value Date   TSH 3.608 07/14/2014    EKG: Sinus rhythm with IV conduction delay suggestive of left bundle branch block  Radiology: Linear atelectasis right middle lobe, previous aortic valve replacement, cardiomegaly   IMPRESSIONS: 1.  Prolonged midepigastric pain not chest pain 2.  Recent TAVR for severe aortic stenosis-some increase in early echo gradient 3.  Stage III chronic kidney disease 4.  Coronary artery disease with previous stent to the circumflex with catheterization showing a 60% right coronary artery stenosis 5.  Hypertensive heart disease 6.  Chronic venous insufficiency with some peripheral leg ulcerations 7.  Type 2 diabetes mellitus 8.  History of chronic respiratory failure with obesity hypoventilation syndrome  PLAN: He appears to have had epigastric rhythm chest pain.  We'll check serial enzymes and also check a gallbladder ultrasound.  His situation has been continuous in the past and will observe overnight and if stable home in the morning.  Signed: Darden Palmer MD [facility]  05/19/2015, 4:59 PM

## 2015-05-19 NOTE — ED Notes (Signed)
Chest pain began suddenly upper abdominal area sharp non-radiating. Pt. Denies any sob n/v/.  Pt. Was given 1 nitro and he took 1 nitro and 324mg  ASA pain diminished.  Rt. Shoulder pain due to his arthritis.  Skin is pink, warm and dry.  Alert and oriented X3

## 2015-05-19 NOTE — ED Provider Notes (Signed)
CSN: [id number]     Arrival date & time 05/19/15  1323 History   First MD Initiated Contact with Patient 05/19/15 1351     Chief Complaint  Patient presents with  . Chest Pain     (Consider location/radiation/quality/duration/timing/severity/associated sxs/prior Treatment) Patient is a 81 y.o. male presenting with chest pain. The history is provided by the patient (The patient stated that he had left-sided chest pain today which lasted approximately an hour. The pain was pressure with stabbing symptoms. He was given a couple nitroglycerin which relieved the pain.).  Chest Pain Pain location:  L chest Pain quality: aching   Pain radiates to:  Does not radiate Pain radiates to the back: no   Pain severity:  Moderate Onset quality:  Sudden Timing:  Intermittent Progression:  Resolved Chronicity:  New Associated symptoms: no abdominal pain, no back pain, no cough, no fatigue and no headache     Past Medical History  Diagnosis Date  . Gout, unspecified     severe dz, "treatment done that last 15 years"  . Obesity (BMI 30-39.9)   . Restless leg syndrome   . History of retinal detachment 1994  . Hypertensive heart disease   . CAD (coronary artery disease), native coronary artery     a. CAD s/p PTCA of LCx and OM bifurcation 2010. b. Residual disease by cath 02/2015 - med rx for now, reserving PCI for recurrent sx.  Marland Kitchen History of pericarditis 2007    a. h/o MSSA, s/p pericardial window.  Marland Kitchen GERD   . Osteoarthritis   . Sleep apnea in adult     CPAP qhs  . TIA (transient ischemic attack) 2005  . BPH (benign prostatic hypertrophy)     "minor"  . Lumbar spinal stenosis   . Unspecified venous (peripheral) insufficiency   . Stroke [facility]) May 2003  . Anxiety   . Depression   . Aortic stenosis     a. s/p TAVR 03/2015.  . Carotid artery disease (HCC)     Prior TIA 2006 with left CEA by Dr. Arbie Cookey Recurrent TIA in April 2011   . Lumbar disc disease   . CKD (chronic kidney disease),  stage III 02/08/2014  . Hypertension   . Type 2 diabetes mellitus with vascular disease (HCC)   . 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 Chronic diastolic CHF (congestive heart failure) (HCC)   . LBBB (left bundle branch block)     a. Intermittent LBBB on EKGs.   Past Surgical History  Procedure Laterality Date  . Pericardial window  2007  . Left arm  2008    shoulder  . Right arm  1970's    shoulder  . Cataract extraction      Left side x's 2 and right  . Retinal detachment surgery      left side  . Carotid endarterectomy Left 12-06-05    cea  . Coronary angioplasty  01/2009    x 1 stent  . Colonoscopy    . Carpal tunnel release Bilateral   . Back surgery  2013    removed bone spurs  . Inguinal hernia repair Left 10/01/2013    Procedure: HERNIA REPAIR INGUINAL ADULT;  Surgeon: Axel Filler, MD;  Location: [facility] ORS;  Service: General;  Laterality: Left;  . Insertion of mesh Left 10/01/2013    Procedure: INSERTION OF MESH;  Surgeon: Jed Limerick  Derrell Lolling, MD;  Location: [facility] ORS;  Service: General;  Laterality: Left;  . Finger amputation  May 2015    JUST TO FIRST JOINT RIGHT HAND  LAST FINGER ( Right 5th finger)  . Peripheral vascular catheterization N/A 01/08/2015    Procedure: Abdominal Aortogram;  Surgeon: Chuck Hint, MD;  Location: [facility] INVASIVE CV LAB;  Service: Cardiovascular;  Laterality: N/A;  . Cardiac catheterization N/A 03/15/2015    Procedure: Right/Left Heart Cath and Coronary Angiography;  Surgeon: Tonny Bollman, MD;  Location: [facility] INVASIVE CV LAB;  Service: Cardiovascular;  Laterality: N/A;  . Transcatheter aortic valve replacement, transfemoral N/A 04/06/2015    Procedure: TRANSCATHETER AORTIC VALVE REPLACEMENT, TRANSFEMORAL;  Surgeon: Tonny Bollman, MD;  Location: [facility] OR;  Service: Open Heart Surgery;  Laterality: N/A;  . Tee without cardioversion  N/A 04/06/2015    Procedure: TRANSESOPHAGEAL ECHOCARDIOGRAM (TEE);  Surgeon: Tonny Bollman, MD;  Location: [facility] OR;  Service: Open Heart Surgery;  Laterality: N/A;   Family History  Problem Relation Age of Onset  . Heart disease Mother     Before age 8  . Heart attack Mother   . Diabetes Father   . Heart disease Father   . Breast cancer Sister   . Cancer Sister   . Prostate cancer Brother   . Lung cancer Brother   . Cancer Brother   . Diabetes Daughter   . Diabetes Son   . Hypertension Son    Social History  Substance Use Topics  . Smoking status: Never Smoker   . Smokeless tobacco: Never Used  . Alcohol Use: No    Review of Systems  Constitutional: Negative for appetite change and fatigue.  HENT: Negative for congestion, ear discharge and sinus pressure.   Eyes: Negative for discharge.  Respiratory: Negative for cough.   Cardiovascular: Positive for chest pain.  Gastrointestinal: Negative for abdominal pain and diarrhea.  Genitourinary: Negative for frequency and hematuria.  Musculoskeletal: Negative for back pain.  Skin: Negative for rash.  Neurological: Negative for seizures and headaches.  Psychiatric/Behavioral: Negative for hallucinations.      Allergies  Statins  Home Medications   Prior to Admission medications   Medication Sig Start Date End Date Taking? Authorizing Provider  acetaminophen (TYLENOL) 325 MG tablet Take 650 mg by mouth every 6 (six) hours as needed for mild pain or moderate pain.   Yes Historical Provider, MD  allopurinol (ZYLOPRIM) 300 MG tablet Take 1 tablet (300 mg total) by mouth every morning. 10/06/14  Yes Newt Lukes, MD  amLODipine (NORVASC) 5 MG tablet Take 5 mg by mouth daily.  03/08/15  Yes Historical Provider, MD  aspirin EC 81 MG tablet Take 81 mg by mouth daily.   Yes Historical Provider, MD  buPROPion (WELLBUTRIN XL) 150 MG 24 hr tablet TAKE ONE TABLET BY MOUTH EVERY MORNING 05/04/15  Yes Newt Lukes, MD   cholecalciferol (VITAMIN D) 1000 UNITS tablet Take 1,000 Units by mouth daily.   Yes Historical Provider, MD  clopidogrel (PLAVIX) 75 MG tablet Take 1 tablet (75 mg total) by mouth daily. 11/11/14  Yes Newt Lukes, MD  docusate sodium 100 MG CAPS Take 100 mg by mouth 2 (two) times daily. 07/16/14  Yes Ripudeep Jenna Luo, MD  Ferrous Gluconate 256 (28 FE) MG TABS Take 256 mg by mouth daily.   Yes Historical Provider, MD  furosemide (LASIX) 40 MG tablet Take 1 tablet (40 mg total) by mouth 2 (two) times daily. 04/09/15  Yes Wynema Birch  Meng, PA  LORazepam (ATIVAN) 1 MG tablet TAKE ONE TABLET BY MOUTH EVERY 8 HOURS AS NEEDED FOR ANXIETY/SLEEP 03/23/15  Yes Etta Grandchild, MD  Magnesium 250 MG TABS Take 250 mg by mouth daily.    Yes Historical Provider, MD  metoprolol succinate (TOPROL-XL) 25 MG 24 hr tablet Take 1 tablet (25 mg total) by mouth daily. Take with or immediately following a meal. 04/09/15  Yes Azalee Course, PA  mirabegron ER (MYRBETRIQ) 50 MG TB24 tablet Take 1 tablet (50 mg total) by mouth daily. 10/06/14  Yes Newt Lukes, MD  mirtazapine (REMERON) 15 MG tablet Take 1 tablet (15 mg total) by mouth at bedtime. 09/10/14  Yes Newt Lukes, MD  nitroGLYCERIN (NITROSTAT) 0.4 MG SL tablet Place 0.4 mg under the tongue every 5 (five) minutes as needed for chest pain (3 doses MAX).   Yes Historical Provider, MD  pantoprazole (PROTONIX) 40 MG tablet Take 1 tablet (40 mg total) by mouth 2 (two) times daily. 10/06/14  Yes Newt Lukes, MD  pregabalin (LYRICA) 200 MG capsule Take 1 capsule (200 mg total) by mouth at bedtime. 12/15/14  Yes Newt Lukes, MD  rOPINIRole (REQUIP) 4 MG tablet Take 1 tablet (4 mg total) by mouth at bedtime. 10/06/14  Yes Newt Lukes, MD  tamsulosin (FLOMAX) 0.4 MG CAPS capsule Take 1 capsule (0.4 mg total) by mouth daily. 11/11/14  Yes Newt Lukes, MD   BP 143/49 mmHg  Pulse 73  Temp(Src) 97.5 F (36.4 C) (Oral)  Resp 18  SpO2 98% Physical Exam   Constitutional: He is oriented to person, place, and time. He appears well-developed.  HENT:  Head: Normocephalic.  Eyes: Conjunctivae and EOM are normal. No scleral icterus.  Neck: Neck supple. No thyromegaly present.  Cardiovascular: Normal rate and regular rhythm.  Exam reveals no gallop and no friction rub.   No murmur heard. Pulmonary/Chest: No stridor. He has no wheezes. He has no rales. He exhibits no tenderness.  Abdominal: He exhibits no distension. There is no tenderness. There is no rebound.  Musculoskeletal: Normal range of motion. He exhibits no edema.  Lymphadenopathy:    He has no cervical adenopathy.  Neurological: He is oriented to person, place, and time. He exhibits normal muscle tone. Coordination normal.  Skin: No rash noted. No erythema.  Psychiatric: He has a normal mood and affect. His behavior is normal.    ED Course  Procedures (including critical care time) Labs Review Labs Reviewed  BASIC METABOLIC PANEL - Abnormal; Notable for the following:    Glucose, Bld 129 (*)    BUN 31 (*)    Creatinine, Ser 1.69 (*)    GFR calc non Af Amer 37 (*)    GFR calc Af Amer 42 (*)    All other components within normal limits  HEPATIC FUNCTION PANEL - Abnormal; Notable for the following:    Albumin 3.3 (*)    AST 84 (*)    All other components within normal limits  CBC  BRAIN NATRIURETIC PEPTIDE  I-STAT TROPOININ, ED    Imaging Review Dg Chest 2 View  05/19/2015  CLINICAL DATA:  Sun left lower chest epigastric pain. EXAM: CHEST  2 VIEW COMPARISON:  04/07/2015 FINDINGS: Mesh graft in the expected location of the aortic valve is seen. Cardiomediastinal silhouette is normal. Mediastinal contours appear intact. There is no evidence of focal airspace consolidation, pleural effusion or pneumothorax. There is mildly decreased lung volume. Linear atelectasis is seen  in the right middle lobe. Osseous structures are without acute abnormality. Soft tissues are grossly normal.  IMPRESSION: Low lung volumes with linear atelectasis of the right middle lobe. Electronically Signed   By: Ted Mcalpine M.D.   On: 05/19/2015 15:10   I have personally reviewed and evaluated these images and lab results as part of my medical decision-making.   EKG Interpretation   Date/Time:  Wednesday May 19 2015 13:42:07 EDT Ventricular Rate:  64 PR Interval:    QRS Duration: 121 QT Interval:  436 QTC Calculation: 450 R Axis:   -28 Text Interpretation:  Junctional rhythm Left bundle branch block Confirmed  by Caitlain Tweed  MD, Yatziry Deakins ([id number]) on 05/19/2015 3:58:23 PM      MDM   Final diagnoses:  None     Patient had chest pain for approximately 1 hour at that has resolved now. Labs unremarkable EKG shows left bundle. I spoke with his cardiologist Dr. Donnie Aho and Dr. Donnie Aho stated that he will come and consult on patient.   Bethann Berkshire, MD 05/19/15 [id number]

## 2015-05-19 NOTE — Progress Notes (Signed)
Patient arrived to floor from ED. Patient placed in chair. No Chest pain, SOB, or signs of distress. Tele box placed and CCMD notified. Patient oriented to unit and room. Will continue to monitor.   Valinda Hoar RN

## 2015-05-20 ENCOUNTER — Encounter (HOSPITAL_COMMUNITY): Payer: Self-pay | Admitting: General Surgery

## 2015-05-20 ENCOUNTER — Telehealth: Payer: Self-pay | Admitting: *Deleted

## 2015-05-20 ENCOUNTER — Observation Stay (HOSPITAL_COMMUNITY): Payer: Medicare Other

## 2015-05-20 DIAGNOSIS — N183 Chronic kidney disease, stage 3 (moderate): Secondary | ICD-10-CM | POA: Diagnosis not present

## 2015-05-20 DIAGNOSIS — K8050 Calculus of bile duct without cholangitis or cholecystitis without obstruction: Secondary | ICD-10-CM | POA: Diagnosis not present

## 2015-05-20 DIAGNOSIS — I5032 Chronic diastolic (congestive) heart failure: Secondary | ICD-10-CM | POA: Diagnosis not present

## 2015-05-20 DIAGNOSIS — E785 Hyperlipidemia, unspecified: Secondary | ICD-10-CM | POA: Diagnosis not present

## 2015-05-20 LAB — CBC WITH DIFFERENTIAL/PLATELET
BASOS PCT: 1 %
Basophils Absolute: 0 10*3/uL (ref 0.0–0.1)
EOS PCT: 7 %
Eosinophils Absolute: 0.3 10*3/uL (ref 0.0–0.7)
HEMATOCRIT: 41.7 % (ref 39.0–52.0)
Hemoglobin: 13 g/dL (ref 13.0–17.0)
LYMPHS PCT: 31 %
Lymphs Abs: 1.4 10*3/uL (ref 0.7–4.0)
MCH: 29.9 pg (ref 26.0–34.0)
MCHC: 31.2 g/dL (ref 30.0–36.0)
MCV: 95.9 fL (ref 78.0–100.0)
MONO ABS: 0.3 10*3/uL (ref 0.1–1.0)
MONOS PCT: 7 %
Neutro Abs: 2.4 10*3/uL (ref 1.7–7.7)
Neutrophils Relative %: 54 %
Platelets: 76 10*3/uL — ABNORMAL LOW (ref 150–400)
RBC: 4.35 MIL/uL (ref 4.22–5.81)
RDW: 15 % (ref 11.5–15.5)
WBC: 4.4 10*3/uL (ref 4.0–10.5)

## 2015-05-20 LAB — COMPREHENSIVE METABOLIC PANEL
ALBUMIN: 3.4 g/dL — AB (ref 3.5–5.0)
ALK PHOS: 113 U/L (ref 38–126)
ALT: 92 U/L — ABNORMAL HIGH (ref 17–63)
ANION GAP: 6 (ref 5–15)
AST: 138 U/L — ABNORMAL HIGH (ref 15–41)
BILIRUBIN TOTAL: 0.8 mg/dL (ref 0.3–1.2)
BUN: 30 mg/dL — ABNORMAL HIGH (ref 6–20)
CALCIUM: 8.8 mg/dL — AB (ref 8.9–10.3)
CO2: 31 mmol/L (ref 22–32)
Chloride: 101 mmol/L (ref 101–111)
Creatinine, Ser: 1.76 mg/dL — ABNORMAL HIGH (ref 0.61–1.24)
GFR calc non Af Amer: 35 mL/min — ABNORMAL LOW (ref >60)
GFR, EST AFRICAN AMERICAN: 40 mL/min — AB (ref >60)
GLUCOSE: 117 mg/dL — AB (ref 65–99)
Potassium: 4.6 mmol/L (ref 3.5–5.1)
Sodium: 138 mmol/L (ref 135–145)
TOTAL PROTEIN: 6.5 g/dL (ref 6.5–8.1)

## 2015-05-20 LAB — TROPONIN I
Troponin I: <0.03 ng/mL (ref <0.031)
Troponin I: <0.03 ng/mL (ref <0.031)

## 2015-05-20 LAB — GLUCOSE, CAPILLARY
Glucose-Capillary: 140 mg/dL — ABNORMAL HIGH (ref 65–99)
Glucose-Capillary: 145 mg/dL — ABNORMAL HIGH (ref 65–99)

## 2015-05-20 LAB — HEMOGLOBIN A1C
Hgb A1c MFr Bld: 5.8 % — ABNORMAL HIGH (ref 4.8–5.6)
MEAN PLASMA GLUCOSE: 120 mg/dL

## 2015-05-20 MED ORDER — OXYCODONE HCL 5 MG PO TABS
10.0000 mg | ORAL_TABLET | Freq: Four times a day (QID) | ORAL | Status: DC | PRN
  Administered 2015-05-20: 10 mg via ORAL
  Filled 2015-05-20: qty 2

## 2015-05-20 NOTE — Consult Note (Signed)
[name] [birth year]  [id number].   Requesting MD: Dr. Tollie Eth Chief Complaint/Reason for Consult: biliary colic HPI: This is an 81 yo white male with a h/o severe AS who underwent a TAVR about 1 month ago.  He has done well with this.  He is on plavix and ASA (the plavix has been in place since some stents were placed in 2011).  He is on O2 at night per his chart.    The patient went to eat a sausage biscuit yesterday morning.  He then developed epigastric abdominal pain.  He call EMS given his heart history.  He states really by the time they were bringing him to the hospital his pain had almost dissipated.  He denied any nausea or vomiting yesterday, but states over the last 2 months or so, he has intermittently developed some mild post prandial epigastric discomfort and occasional nausea, but these would all go away on their own and rather quickly.  Upon arrival, he was admitted to rule out a cardiac cause for his symptoms.  This was done and then an abdominal US was ordered that revealed sludge, but no stones or evidence for acute cholecystitis. his WBC is normal and his LFTs were mildly elevated, but are trending down for the most part.  We have been asked to see the patient today for evaluation of cholecystectomy.    ROS : Please see HPI, otherwise all other systems are essentially negative.  He denies SOB when ambulating.  He states his hip arthritis is what limits him, however, reading cards notes, he still complains of some SOB with mobilization, but improved since his TAVR.  He sleeps on 2 pillows for comfort.  He does wear 2L O2 at night apparently.  Family History  Problem Relation Age of Onset  . Heart disease Mother     Before age 5  . Heart attack Mother   . Diabetes Father   . Heart disease Father   . Breast cancer Sister   . Cancer Sister   . Prostate cancer Brother   . Lung cancer Brother   . Cancer Brother   . Diabetes Daughter   . Diabetes Son   .  Hypertension Son     Past Medical History  Diagnosis Date  . Obesity (BMI 30-39.9)   . Restless leg syndrome   . Hypertensive heart disease   . CAD (coronary artery disease), native coronary artery     a. CAD s/p PTCA of LCx and OM bifurcation 2010. b. Residual disease by cath 02/2015 - med rx for now, reserving PCI for recurrent sx.  Marland Kitchen History of pericarditis 2007    a. h/o MSSA, s/p pericardial window.  Marland Kitchen GERD   . TIA (transient ischemic attack) 2005  . BPH (benign prostatic hypertrophy)     "minor"  . Lumbar spinal stenosis   . Unspecified venous (peripheral) insufficiency   . Stroke [facility]) May 2003  . Anxiety   . Depression   . Aortic stenosis     a. s/p TAVR 03/2015.  . Carotid artery disease ([place])     Prior TIA 2006 with left CEA by Dr. Donnetta Hutching Recurrent TIA in April 2011   . Lumbar disc disease   . CKD (chronic kidney disease), stage III 02/08/2014  . Hypertension   . 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 Chronic diastolic CHF (congestive heart  failure) ([place])   . LBBB (left bundle branch block)     a. Intermittent LBBB on EKGs.  . Statin intolerance   . Heart murmur   . Myocardial infarction [facility]) ~ 2005; 2011    "light"  . Pneumonia 12/2014  . OSA on CPAP   . On home oxygen therapy     "I take 2L at night"  . Type 2 diabetes mellitus with vascular disease ([place])   . Osteoarthritis     "qwhere"  . Chronic lower back pain   . History of gout     "took IV treatment for 1 yr~ 2010; not suppose to have trouble w/gout for 15 years; haven't had problems since treatment" (05/19/2015)  . Acute respiratory failure with hypoxia ([place]) 05/2014; 12/2014    "vented ~ 5days; Vented ~ 14days"    Past Surgical History  Procedure Laterality Date  . Pericardial window  2007  . Shoulder arthroscopy Left 2008  . Shoulder surgery Right 1970's    "took out part of  the joint and the bursa"  . Cataract extraction w/ intraocular lens  implant, bilateral Bilateral     Left side x's 2 and right  . Retinal detachment surgery Left   . Carotid endarterectomy Left 12-06-05  . Coronary angioplasty with stent placement  01/2009    x 1 stent  . Colonoscopy    . Carpal tunnel release Bilateral   . Lumbar spine surgery  05/2012    "@ [place], had arthritis real bad; took out lots of bone spurs"  . Inguinal hernia repair Left 10/01/2013    Procedure: HERNIA REPAIR INGUINAL ADULT;  Surgeon: Ralene Ok, MD;  Location: [facility];  Service: General;  Laterality: Left;  . Insertion of mesh Left 10/01/2013    Procedure: INSERTION OF MESH;  Surgeon: Ralene Ok, MD;  Location: [facility];  Service: General;  Laterality: Left;  . Finger amputation Right 11/2013    JUST TO FIRST JOINT RIGHT HAND  LAST FINGER ( Right 5th finger)  . Peripheral vascular catheterization N/A 01/08/2015    Procedure: Abdominal Aortogram;  Surgeon: Angelia Mould, MD;  Location: [place] [facility];  Service: Cardiovascular;  Laterality: N/A;  . Cardiac catheterization N/A 03/15/2015    Procedure: Right/Left Heart Cath and Coronary Angiography;  Surgeon: Sherren Mocha, MD;  Location: [place] [facility];  Service: Cardiovascular;  Laterality: N/A;  . Transcatheter aortic valve replacement, transfemoral N/A 04/06/2015    Procedure: TRANSCATHETER AORTIC VALVE REPLACEMENT, TRANSFEMORAL;  Surgeon: Sherren Mocha, MD;  Location: [facility];  Service: Open Heart Surgery;  Laterality: N/A;  . Tee without cardioversion N/A 04/06/2015    Procedure: TRANSESOPHAGEAL ECHOCARDIOGRAM (TEE);  Surgeon: Sherren Mocha, MD;  Location: [place];  Service: Open Heart Surgery;  Laterality: N/A;  . Cataract extraction w/ intraocular lens implant      "botched it the first time"  . Eye surgery    . Inguinal hernia repair    . Back surgery    . Cardiac valve replacement      Social History:  reports that he has never  smoked. He has never used smokeless tobacco. He reports that he does not drink alcohol or use illicit drugs.  Allergies:  Allergies  Allergen Reactions  . Statins Other (See Comments)    Severe leg myalgias, weakness    Medications Prior to Admission  Medication Sig Dispense Refill  . acetaminophen (TYLENOL) 325 MG tablet Take 650 mg by mouth every 6 (six)  hours as needed for mild pain or moderate pain.    Marland Kitchen allopurinol (ZYLOPRIM) 300 MG tablet Take 1 tablet (300 mg total) by mouth every morning. 90 tablet 1  . amLODipine (NORVASC) 5 MG tablet Take 5 mg by mouth daily.     Marland Kitchen aspirin EC 81 MG tablet Take 81 mg by mouth daily.    Marland Kitchen buPROPion (WELLBUTRIN XL) 150 MG 24 hr tablet TAKE ONE TABLET BY MOUTH EVERY MORNING 90 tablet 0  . cholecalciferol (VITAMIN D) 1000 UNITS tablet Take 1,000 Units by mouth daily.    . clopidogrel (PLAVIX) 75 MG tablet Take 1 tablet (75 mg total) by mouth daily. 90 tablet 3  . docusate sodium 100 MG CAPS Take 100 mg by mouth 2 (two) times daily. 10 capsule 0  . Ferrous Gluconate 256 (28 FE) MG TABS Take 256 mg by mouth daily.    . furosemide (LASIX) 40 MG tablet Take 1 tablet (40 mg total) by mouth 2 (two) times daily. 60 tablet 3  . LORazepam (ATIVAN) 1 MG tablet TAKE ONE TABLET BY MOUTH EVERY 8 HOURS AS NEEDED FOR ANXIETY/SLEEP 30 tablet 0  . Magnesium 250 MG TABS Take 250 mg by mouth daily.     . metoprolol succinate (TOPROL-XL) 25 MG 24 hr tablet Take 1 tablet (25 mg total) by mouth daily. Take with or immediately following a meal. 30 tablet 11  . mirabegron ER (MYRBETRIQ) 50 MG TB24 tablet Take 1 tablet (50 mg total) by mouth daily. 90 tablet 1  . mirtazapine (REMERON) 15 MG tablet Take 1 tablet (15 mg total) by mouth at bedtime. 30 tablet 11  . nitroGLYCERIN (NITROSTAT) 0.4 MG SL tablet Place 0.4 mg under the tongue every 5 (five) minutes as needed for chest pain (3 doses MAX).    Marland Kitchen pantoprazole (PROTONIX) 40 MG tablet Take 1 tablet (40 mg total) by mouth 2  (two) times daily. 90 tablet 1  . pregabalin (LYRICA) 200 MG capsule Take 1 capsule (200 mg total) by mouth at bedtime. 90 capsule 1  . rOPINIRole (REQUIP) 4 MG tablet Take 1 tablet (4 mg total) by mouth at bedtime. 90 tablet 1  . tamsulosin (FLOMAX) 0.4 MG CAPS capsule Take 1 capsule (0.4 mg total) by mouth daily. 90 capsule 3    Blood pressure 130/51, pulse 67, temperature 98.4 F (36.9 C), temperature source Oral, resp. rate 18, height _0  (1.778 m), weight 109.1 kg (240 lb 8.4 oz), SpO2 92 %. Physical Exam:    General: pleasant, obese white male who is laying in bed in NAD HEENT: head is normocephalic, atraumatic.  Sclera are noninjected.  PERRL.  Ears and nose without any masses or lesions.  Mouth is pink and moist Heart: regular, rate, and rhythm.  Normal s1,s2. No obvious gallops, or rubs noted.  + murmur  Palpable radial and pedal pulses bilaterally Lungs: CTAB, no wheezes, rhonchi, or rales noted.  Respiratory effort nonlabored Abd: soft, minimal epigastric discomfort, ND, but obese, +BS, no masses, hernias, or organomegaly.  Rectus diastasis present MS: all 4 extremities are symmetrical with no cyanosis, clubbing, or edema. Skin: warm and dry with no masses, lesions, or rashes Psych: A&Ox3 with an appropriate affect.     Results for orders placed or performed during the hospital encounter of 05/19/15 (from the past 48 hour(s))  Basic metabolic panel     Status: Abnormal   Collection Time: 05/19/15  2:31 PM  Result Value Ref Range   Sodium 143  135 - 145 mmol/L   Potassium 4.4 3.5 - 5.1 mmol/L   Chloride 105 101 - 111 mmol/L   CO2 27 22 - 32 mmol/L   Glucose, Bld 129 (H) 65 - 99 mg/dL   BUN 31 (H) 6 - 20 mg/dL   Creatinine, Ser 1.69 (H) 0.61 - 1.24 mg/dL   Calcium 9.0 8.9 - 10.3 mg/dL   GFR calc non Af Amer 37 (L) >60 mL/min   GFR calc Af Amer 42 (L) >60 mL/min    Comment: (NOTE) The eGFR has been calculated using the CKD EPI equation. This calculation has not been  validated in all clinical situations. eGFR's persistently <60 mL/min signify possible Chronic Kidney Disease.    Anion gap 11 5 - 15  CBC     Status: None   Collection Time: 05/19/15  2:31 PM  Result Value Ref Range   WBC 5.5 4.0 - 10.5 K/uL   RBC 4.57 4.22 - 5.81 MIL/uL   Hemoglobin 13.7 13.0 - 17.0 g/dL   HCT 43.1 39.0 - 52.0 %   MCV 94.3 78.0 - 100.0 fL   MCH 30.0 26.0 - 34.0 pg   MCHC 31.8 30.0 - 36.0 g/dL   RDW 15.0 11.5 - 15.5 %   Platelets  150 - 400 K/uL    PLATELET CLUMPS NOTED ON SMEAR, COUNT APPEARS DECREASED  Brain natriuretic peptide     Status: None   Collection Time: 05/19/15  2:31 PM  Result Value Ref Range   B Natriuretic Peptide 67.6 0.0 - 100.0 pg/mL  I-stat troponin, ED     Status: None   Collection Time: 05/19/15  2:31 PM  Result Value Ref Range   Troponin i, poc 0.01 0.00 - 0.08 ng/mL   Comment 3            Comment: Due to the release kinetics of cTnI, a negative result within the first hours of the onset of symptoms does not rule out myocardial infarction with certainty. If myocardial infarction is still suspected, repeat the test at appropriate intervals.   Hepatic function panel     Status: Abnormal   Collection Time: 05/19/15  2:32 PM  Result Value Ref Range   Total Protein 6.6 6.5 - 8.1 g/dL   Albumin 3.3 (L) 3.5 - 5.0 g/dL   AST 84 (H) 15 - 41 U/L   ALT 37 17 - 63 U/L   Alkaline Phosphatase 94 38 - 126 U/L   Total Bilirubin 0.7 0.3 - 1.2 mg/dL   Bilirubin, Direct 0.3 0.1 - 0.5 mg/dL   Indirect Bilirubin 0.4 0.3 - 0.9 mg/dL  Comprehensive metabolic panel     Status: Abnormal   Collection Time: 05/19/15  7:03 PM  Result Value Ref Range   Sodium 140 135 - 145 mmol/L   Potassium 4.5 3.5 - 5.1 mmol/L   Chloride 103 101 - 111 mmol/L   CO2 30 22 - 32 mmol/L   Glucose, Bld 128 (H) 65 - 99 mg/dL   BUN 32 (H) 6 - 20 mg/dL   Creatinine, Ser 1.59 (H) 0.61 - 1.24 mg/dL   Calcium 8.9 8.9 - 10.3 mg/dL   Total Protein 6.6 6.5 - 8.1 g/dL   Albumin  3.4 (L) 3.5 - 5.0 g/dL   AST 155 (H) 15 - 41 U/L   ALT 72 (H) 17 - 63 U/L   Alkaline Phosphatase 108 38 - 126 U/L   Total Bilirubin 1.4 (H) 0.3 - 1.2 mg/dL  GFR calc non Af Amer 39 (L) >60 mL/min   GFR calc Af Amer 46 (L) >60 mL/min    Comment: (NOTE) The eGFR has been calculated using the CKD EPI equation. This calculation has not been validated in all clinical situations. eGFR's persistently <60 mL/min signify possible Chronic Kidney Disease.    Anion gap 7 5 - 15  Troponin I-(serum)     Status: None   Collection Time: 05/19/15  7:03 PM  Result Value Ref Range   Troponin I <0.03 <0.031 ng/mL    Comment:        NO INDICATION OF MYOCARDIAL INJURY.   CBC     Status: Abnormal   Collection Time: 05/19/15  7:03 PM  Result Value Ref Range   WBC 4.9 4.0 - 10.5 K/uL   RBC 4.51 4.22 - 5.81 MIL/uL   Hemoglobin 13.6 13.0 - 17.0 g/dL   HCT 42.8 39.0 - 52.0 %   MCV 94.9 78.0 - 100.0 fL   MCH 30.2 26.0 - 34.0 pg   MCHC 31.8 30.0 - 36.0 g/dL   RDW 14.9 11.5 - 15.5 %   Platelets 84 (L) 150 - 400 K/uL    Comment: PLATELET COUNT CONFIRMED BY SMEAR  Hemoglobin A1c     Status: Abnormal   Collection Time: 05/19/15  7:03 PM  Result Value Ref Range   Hgb A1c MFr Bld 5.8 (H) 4.8 - 5.6 %    Comment: (NOTE)         Pre-diabetes: 5.7 - 6.4         Diabetes: >6.4         Glycemic control for adults with diabetes: <7.0    Mean Plasma Glucose 120 mg/dL    Comment: (NOTE) Performed At: [facility] [address] [place], [place] [id number] Lindon Romp MD [id number]   Glucose, capillary     Status: Abnormal   Collection Time: 05/19/15  9:39 PM  Result Value Ref Range   Glucose-Capillary 162 (H) 65 - 99 mg/dL  Troponin I-(serum)     Status: None   Collection Time: 05/20/15 12:29 AM  Result Value Ref Range   Troponin I <0.03 <0.031 ng/mL    Comment:        NO INDICATION OF MYOCARDIAL INJURY.   Glucose, capillary     Status: Abnormal   Collection Time: 05/20/15  5:51 AM   Result Value Ref Range   Glucose-Capillary 140 (H) 65 - 99 mg/dL  Troponin I-(serum)     Status: None   Collection Time: 05/20/15  6:48 AM  Result Value Ref Range   Troponin I <0.03 <0.031 ng/mL    Comment:        NO INDICATION OF MYOCARDIAL INJURY.   Comprehensive metabolic panel     Status: Abnormal   Collection Time: 05/20/15  6:48 AM  Result Value Ref Range   Sodium 138 135 - 145 mmol/L   Potassium 4.6 3.5 - 5.1 mmol/L   Chloride 101 101 - 111 mmol/L   CO2 31 22 - 32 mmol/L   Glucose, Bld 117 (H) 65 - 99 mg/dL   BUN 30 (H) 6 - 20 mg/dL   Creatinine, Ser 1.76 (H) 0.61 - 1.24 mg/dL   Calcium 8.8 (L) 8.9 - 10.3 mg/dL   Total Protein 6.5 6.5 - 8.1 g/dL   Albumin 3.4 (L) 3.5 - 5.0 g/dL   AST 138 (H) 15 - 41 U/L   ALT 92 (H) 17 - 63 U/L  Alkaline Phosphatase 113 38 - 126 U/L   Total Bilirubin 0.8 0.3 - 1.2 mg/dL   GFR calc non Af Amer 35 (L) >60 mL/min   GFR calc Af Amer 40 (L) >60 mL/min    Comment: (NOTE) The eGFR has been calculated using the CKD EPI equation. This calculation has not been validated in all clinical situations. eGFR's persistently <60 mL/min signify possible Chronic Kidney Disease.    Anion gap 6 5 - 15   Dg Chest 2 View  05/19/2015  CLINICAL DATA:  Sun left lower chest epigastric pain. EXAM: CHEST  2 VIEW COMPARISON:  04/07/2015 FINDINGS: Mesh graft in the expected location of the aortic valve is seen. Cardiomediastinal silhouette is normal. Mediastinal contours appear intact. There is no evidence of focal airspace consolidation, pleural effusion or pneumothorax. There is mildly decreased lung volume. Linear atelectasis is seen in the right middle lobe. Osseous structures are without acute abnormality. Soft tissues are grossly normal. IMPRESSION: Low lung volumes with linear atelectasis of the right middle lobe. Electronically Signed   By: Fidela Salisbury M.D.   On: 05/19/2015 15:10   US Abdomen Complete  05/20/2015  CLINICAL DATA:  Epigastric  abdominal pain for 1 day. EXAM: ULTRASOUND ABDOMEN COMPLETE COMPARISON:  CT angiogram of the chest, abdomen and pelvis from 03/31/2015. FINDINGS: Gallbladder: Nondistended gallbladder is filled with sludge, with no shadowing gallstones demonstrated. No gallbladder wall thickening or pericholecystic fluid. No sonographic Murphy sign . Common bile duct: Diameter: 5 mm Liver: Liver parenchyma is diffusely markedly echogenic with posterior acoustic attenuation, in keeping with severe diffuse hepatic steatosis. No liver mass detected, noting significantly decreased sensitivity in the setting of severe steatosis. No liver surface irregularity demonstrated. IVC: No abnormality visualized. Pancreas: Poorly visualized due to overlying gas and patient related factors. Spleen: Size and appearance within normal limits. Right Kidney: Length: 11.3 cm. Echogenicity within normal limits. No mass or hydronephrosis visualized. Left Kidney: Length: 11.9 cm. Echogenicity within normal limits. No mass or hydronephrosis visualized. Abdominal aorta: No aneurysm visualized. Other findings: None. IMPRESSION: 1. Severe diffuse hepatic steatosis. 2. Sludge filled gallbladder with no cholelithiasis and no evidence of acute cholecystitis. 3. No biliary ductal dilatation. Electronically Signed   By: Ilona Sorrel M.D.   On: 05/20/2015 07:52       Assessment/Plan 1. Biliary colic -patient has no gallstones or evidence of acute cholecystitis on his [place], but he has a significant amount of sludge that can still cause biliary symptoms.  His symptoms of nausea and some post prandial pain are c/w this finding as well.  His pain went away essentially before he got to the hospital yesterday.  He ate his breakfast this morning and did well with this.  He took pain meds this morning, but he states he did so for his arthritis and not because of abdominal pain.  The patient would benefit from a cholecystectomy, but this does not need to be done  urgently.  He would like to go home and follow up with [place] as an outpatient to discuss elective surgery.  He is on plavix.  Because of his recent TAVR, Dr. Burt Knack and/or Dr. Roxy Manns would need to indicate how long he needs to be post procedure before we could stop his plavix.  TCTS PA states generally it's 6 months.  Will defer this discussion to his cardiac doctors.  He needs to remain on a low fat diet at home to help control his biliary symptoms.  We will have him follow  up with [place] as an outpatient and await timing recommendations by his valve doctors.  There is no evidence of acute infection and so this is not an urgent or emergent thing.  He is stable for dc home from our standpoint with outpatient follow up.  Thank you for this consultation. 2. H/o severe AS, s/p recent TAVR 3. CAD 4. H/o MI 5. DM 6. CKD 7. CHF  Tayleigh Wetherell E 05/20/2015, 10:29 AM Pager: [phone]

## 2015-05-20 NOTE — Progress Notes (Signed)
Patient to D/C home with friend. Education done heart healthy diet. Handout given. IV removed. Tele box removed. Patient belongings given to patient. Patient wheeled to front to D/C home with friend.  Valinda Hoar RN

## 2015-05-20 NOTE — Discharge Instructions (Signed)

## 2015-05-20 NOTE — Telephone Encounter (Signed)
Pt was on TCM list D/C 07/20/15 was having adb. Pain due to biliary coli. Pt will be f/u with specialist in 2 wks Dr. Donnie Aho...Raechel Chute

## 2015-05-20 NOTE — Discharge Summary (Signed)
Physician Discharge Summary  Patient ID: [name] MRN: [id number] DOB/AGE: 11-30-1932 81 y.o.  Admit date: 05/19/2015 Discharge date: 05/20/2015  Primary Physician:  Dr. Felicity Coyer  Primary Discharge Diagnosis:  1. Abdominal pain due to biliary colic  Secondary Discharge Diagnosis: 2. Recent TAVR 3. Chronic diastolic CHF 4. Hyperlipidemia 5. Stage 3 chronic kidney disease 6. Chronic venous insufficiency with leg ulcers 7. Chronic respiratory failure 8. Obesity 9. CAD with prior circumflex stent   Procedures:  Abdominal ultrasound  Consults:  Dr. Harlon Flor General Surgery  [facility] Course: This 81 year old male underwent TAVR for severe aortic stenosis September 13 of 2016.  He did quite well with this but prior to this and had multiple admissions for respiratory failure.  He was placed on oxygen at home for desaturation but had clinically done better and had largely been staying by himself with his family staying with him just a little bit.  He continues complain of mild dyspnea with exertion but notes significant improvement in his dyspnea since the procedure.  He is due to have his teeth removed soon for significant dental caries.  He was seen 2 days ago by Dr. Excell Seltzer and felt to be doing well although the one month echo did show some increase in his aortic valve gradient.  I reviewed the echo and it appeared to show some density at the inferior surface of the valve.  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  He is predominantly limited with low back pain.  He went out to breakfast the day of admission  and had a sausage biscuit and about an hour later developed fairly severe midepigastric pain.  The pain was not in his mid sternal chest area that was severe and lasted around an hour.  EMS was called and he was transported here.  The pain has spontaneously improved.  Labs were remarkable for an elevation of his AST has never been elevated before on previous  labs including one done on September 9. He was pain free when seen in the ER.   Labs showed elevation of AST and the next morning SGOT and AST were higher. Ultrasound showed fatty liver as well as gall bladder sludge. He did not have recurrent pain and was seen by surgery who felt he would need an elective operation when he was able to come off of his Plavix.  Will consult with Dr. Excell Seltzer and arrange this as an outpatient.  He was deemed stable for discharge and was sent home.   Discharge Exam: Blood pressure 130/51, pulse 67, temperature 98.4 F (36.9 C), temperature source Oral, resp. rate 18, height  (1.778 m), weight 109.1 kg (240 lb 8.4 oz), SpO2 92 %. Weight: 109.1 kg (240 lb 8.4 oz) Lungs clear 1-2/6 mumur  Labs: CBC:   Lab Results  Component Value Date   WBC 4.4 05/20/2015   HGB 13.0 05/20/2015   HCT 41.7 05/20/2015   MCV 95.9 05/20/2015   PLT 76* 05/20/2015    CMP:  Recent Labs Lab 05/20/15 0648  NA 138  K 4.6  CL 101  CO2 31  BUN 30*  CREATININE 1.76*  CALCIUM 8.8*  PROT 6.5  BILITOT 0.8  ALKPHOS 113  ALT 92*  AST 138*  GLUCOSE 117*    Lipid Panel     Component Value Date/Time   CHOL 190 07/15/2014 0325   TRIG 312* 01/16/2015 1157   TRIG 154 04/13/2010   HDL 32* 07/15/2014 0325  CHOLHDL 5.9 07/15/2014 0325   VLDL 30 07/15/2014 0325   LDLCALC 128* 07/15/2014 0325    Cardiac Enzymes:  Recent Labs  05/19/15 1903 05/20/15 0029 05/20/15 0648  TROPONINI <0.03 <0.03 <0.03    BNP (last 3 results)  Recent Labs  07/10/14 0801 07/13/14 1928 04/14/15 1057  PROBNP 422.7 814.6* 116.0*    Protime: Invalid input(s): PT  Thyroid: Lab Results  Component Value Date   TSH 3.608 07/14/2014    Hemoglobin A1C: Lab Results  Component Value Date   HGBA1C 5.8* 05/19/2015    Radiology: Low lung volumes with atalectasis  EKG: Sinus with LBBB  Discharge Medications:   Medication List    TAKE these medications        acetaminophen  325 MG tablet  Commonly known as:  TYLENOL  Take 650 mg by mouth every 6 (six) hours as needed for mild pain or moderate pain.     allopurinol 300 MG tablet  Commonly known as:  ZYLOPRIM  Take 1 tablet (300 mg total) by mouth every morning.     amLODipine 5 MG tablet  Commonly known as:  NORVASC  Take 5 mg by mouth daily.     aspirin EC 81 MG tablet  Take 81 mg by mouth daily.     buPROPion 150 MG 24 hr tablet  Commonly known as:  WELLBUTRIN XL  TAKE ONE TABLET BY MOUTH EVERY MORNING     cholecalciferol 1000 UNITS tablet  Commonly known as:  VITAMIN D  Take 1,000 Units by mouth daily.     clopidogrel 75 MG tablet  Commonly known as:  PLAVIX  Take 1 tablet (75 mg total) by mouth daily.     DSS 100 MG Caps  Take 100 mg by mouth 2 (two) times daily.     Ferrous Gluconate 256 (28 FE) MG Tabs  Take 256 mg by mouth daily.     furosemide 40 MG tablet  Commonly known as:  LASIX  Take 1 tablet (40 mg total) by mouth 2 (two) times daily.     LORazepam 1 MG tablet  Commonly known as:  ATIVAN  TAKE ONE TABLET BY MOUTH EVERY 8 HOURS AS NEEDED FOR ANXIETY/SLEEP     Magnesium 250 MG Tabs  Take 250 mg by mouth daily.     metoprolol succinate 25 MG 24 hr tablet  Commonly known as:  TOPROL-XL  Take 1 tablet (25 mg total) by mouth daily. Take with or immediately following a meal.     mirabegron ER 50 MG Tb24 tablet  Commonly known as:  MYRBETRIQ  Take 1 tablet (50 mg total) by mouth daily.     mirtazapine 15 MG tablet  Commonly known as:  REMERON  Take 1 tablet (15 mg total) by mouth at bedtime.     nitroGLYCERIN 0.4 MG SL tablet  Commonly known as:  NITROSTAT  Place 0.4 mg under the tongue every 5 (five) minutes as needed for chest pain (3 doses MAX).     pantoprazole 40 MG tablet  Commonly known as:  PROTONIX  Take 1 tablet (40 mg total) by mouth 2 (two) times daily.     pregabalin 200 MG capsule  Commonly known as:  LYRICA  Take 1 capsule (200 mg total) by mouth at  bedtime.     rOPINIRole 4 MG tablet  Commonly known as:  REQUIP  Take 1 tablet (4 mg total) by mouth at bedtime.     tamsulosin 0.4 MG Caps  capsule  Commonly known as:  FLOMAX  Take 1 capsule (0.4 mg total) by mouth daily.       Followup plans and appointments: Dr. Donnie Aho in 2 weeks  Time spent with patient to include physician time:  82   Signed: [name]. MD [facility] 05/20/2015, 12:47 PM

## 2015-05-20 NOTE — Progress Notes (Signed)
Subjective:  Severe pain has not returned.  He gives additional history that in the mornings he has some midepigastric burning and stinging pain associated with nausea that improves.  Gall bladder ultrasound showed a lot of sludge but no definite stones, and normal size common bile duct.  Objective:  Vital Signs in the last 24 hours: BP 127/44 mmHg  Pulse 64  Temp(Src) 98.4 F (36.9 C) (Oral)  Resp 18  Ht 5\' 10"  (1.778 m)  Wt 109.1 kg (240 lb 8.4 oz)  BMI 34.51 kg/m2  SpO2 92%  Physical Exam: Obese pleasant male in no acute distress  Lungs:  Clear Cardiac:  Regular rhythm, normal S1 and S2, no S3, 1 to 2/6 murmur across the aortic valve at left sternal border Abdomen:  Soft, nontender, no masses Extremities:  No edema present  Intake/Output from previous day: 10/26 0701 - 10/27 0700 In: -  Out: 250 [Urine:250]  Weight Filed Weights   05/20/15 0540  Weight: 109.1 kg (240 lb 8.4 oz)    Lab Results: Basic Metabolic Panel:  Recent Labs  13/08/65 1903 05/20/15 0648  NA 140 138  K 4.5 4.6  CL 103 101  CO2 30 31  GLUCOSE 128* 117*  BUN 32* 30*  CREATININE 1.59* 1.76*   CMP     Component Value Date/Time   NA 138 05/20/2015 0648   K 4.6 05/20/2015 0648   CL 101 05/20/2015 0648   CO2 31 05/20/2015 0648   GLUCOSE 117* 05/20/2015 0648   BUN 30* 05/20/2015 0648   CREATININE 1.76* 05/20/2015 0648   CREATININE 1.52* 03/30/2015 1101   CALCIUM 8.8* 05/20/2015 0648   PROT 6.5 05/20/2015 0648   ALBUMIN 3.4* 05/20/2015 0648   AST 138* 05/20/2015 0648   ALT 92* 05/20/2015 0648   ALKPHOS 113 05/20/2015 0648   BILITOT 0.8 05/20/2015 0648   CBC:  Recent Labs  05/19/15 1431 05/19/15 1903  WBC 5.5 4.9  HGB 13.7 13.6  HCT 43.1 42.8  MCV 94.3 94.9  PLT PLATELET CLUMPS NOTED ON SMEAR, COUNT APPEARS DECREASED 84*   Cardiac Enzymes: Troponin (Point of Care Test)  Recent Labs  05/19/15 1431  TROPIPOC 0.01   Cardiac Panel (last 3 results)  Recent Labs   05/19/15 1903 05/20/15 0029 05/20/15 0648  TROPONINI <0.03 <0.03 <0.03    Telemetry: Normal sinus rhythm  Assessment/Plan:  1.  Probable biliary colic-he has significant gallstones and his transaminases are going up 2.  Recent TAVR currently on aspirin and Plavix 3.  Diabetes controlled 4.  Coronary artery disease with previous stenting 5.  Chronic diastolic heart failure 6.  Stage III chronic kidney disease 7.  Chronic respiratory insufficiency with obesity hypoventilation syndrome  Recommendations:  Obtain surgical consult.  Will let them decide about a hida scan.      Darden Palmer  MD [facility]  05/20/2015, 9:30 AM

## 2015-05-21 DIAGNOSIS — L97811 Non-pressure chronic ulcer of other part of right lower leg limited to breakdown of skin: Secondary | ICD-10-CM | POA: Diagnosis not present

## 2015-05-21 DIAGNOSIS — E11621 Type 2 diabetes mellitus with foot ulcer: Secondary | ICD-10-CM | POA: Diagnosis not present

## 2015-05-21 DIAGNOSIS — I83018 Varicose veins of right lower extremity with ulcer other part of lower leg: Secondary | ICD-10-CM | POA: Diagnosis not present

## 2015-05-21 DIAGNOSIS — L97511 Non-pressure chronic ulcer of other part of right foot limited to breakdown of skin: Secondary | ICD-10-CM | POA: Diagnosis not present

## 2015-05-24 ENCOUNTER — Other Ambulatory Visit: Payer: Self-pay | Admitting: Internal Medicine

## 2015-05-26 ENCOUNTER — Other Ambulatory Visit: Payer: Self-pay | Admitting: Internal Medicine

## 2015-05-26 NOTE — Telephone Encounter (Signed)
Needs lorazepam and oxycondone filled.  Please call patient in regards.  Patient would like to come in to see Dr. Felicity Coyer for a refill.  Would like referral to pain management since patient will be taking this medication ongoing.  Patient does not have any lorazepam left and only has two oxycondone left.  Patient uses [facility].

## 2015-05-26 NOTE — Telephone Encounter (Signed)
Pls advise about the refill of OXy

## 2015-05-27 ENCOUNTER — Ambulatory Visit: Payer: Medicare Other | Admitting: Internal Medicine

## 2015-05-27 MED ORDER — LORAZEPAM 1 MG PO TABS: 1.0000 mg | ORAL_TABLET | Freq: Two times a day (BID) | ORAL | Status: AC | PRN

## 2015-05-27 NOTE — Telephone Encounter (Signed)
He has not filled a prescription for oxycodone since July 2015 and will not fill. Needs visit. Lorazepam printed and signed.

## 2015-05-28 ENCOUNTER — Ambulatory Visit (INDEPENDENT_AMBULATORY_CARE_PROVIDER_SITE_OTHER): Payer: Medicare Other | Admitting: Internal Medicine

## 2015-05-28 ENCOUNTER — Telehealth: Payer: Self-pay | Admitting: Internal Medicine

## 2015-05-28 ENCOUNTER — Encounter (HOSPITAL_BASED_OUTPATIENT_CLINIC_OR_DEPARTMENT_OTHER): Payer: Medicare Other

## 2015-05-28 ENCOUNTER — Encounter: Payer: Self-pay | Admitting: Internal Medicine

## 2015-05-28 VITALS — BP 146/88 | HR 67 | Temp 97.9°F | Ht 70.0 in | Wt 239.0 lb

## 2015-05-28 DIAGNOSIS — E1159 Type 2 diabetes mellitus with other circulatory complications: Secondary | ICD-10-CM

## 2015-05-28 DIAGNOSIS — G2581 Restless legs syndrome: Secondary | ICD-10-CM

## 2015-05-28 DIAGNOSIS — E785 Hyperlipidemia, unspecified: Secondary | ICD-10-CM | POA: Diagnosis not present

## 2015-05-28 DIAGNOSIS — I110 Hypertensive heart disease with heart failure: Secondary | ICD-10-CM

## 2015-05-28 DIAGNOSIS — I6523 Occlusion and stenosis of bilateral carotid arteries: Secondary | ICD-10-CM

## 2015-05-28 MED ORDER — OXYCODONE-ACETAMINOPHEN 10-325 MG PO TABS: 1.0000 | ORAL_TABLET | Freq: Every day | ORAL | Status: AC | PRN

## 2015-05-28 MED ORDER — MIRABEGRON ER 50 MG PO TB24: 50.0000 mg | ORAL_TABLET | Freq: Every day | ORAL | Status: AC

## 2015-05-28 NOTE — Patient Instructions (Signed)
Please continue all other medications as before, and refills have been done if requested - the percocet  Please have the pharmacy call with any other refills you may need.  Please continue your efforts at being more active, low cholesterol diet, and weight control.  You are otherwise up to date with prevention measures today.  Please keep your appointments with your specialists as you may have planned  Please return in 6 months, or sooner if needed, with Lab testing done 3-5 days before

## 2015-05-28 NOTE — Telephone Encounter (Signed)
Pt came in for OV w/dr. Jonny Ruiz today was inquiring about Lorazepam prescription. Was not in cabinet up front. Rx was on stephanie desk. Victorino Dike gave hard copy to pt...Raechel Chute

## 2015-05-28 NOTE — Progress Notes (Signed)
Pre visit review using our clinic review tool, if applicable. No additional management support is needed unless otherwise documented below in the visit note. 

## 2015-05-28 NOTE — Assessment & Plan Note (Signed)
Mild elev today, o/w stable overall by history and exam, recent data reviewed with pt, and pt to continue medical treatment as before,  to f/u any worsening symptoms or concerns, declines any med change today BP Readings from Last 3 Encounters:  05/28/15 146/88  05/20/15 130/57  05/17/15 160/64

## 2015-05-28 NOTE — Telephone Encounter (Signed)
Patient is requesting a refill for several meds. Scheduling an appt.

## 2015-05-28 NOTE — Assessment & Plan Note (Signed)
Ok for prn percocet, takes only rarely, with #60 lasting 18 mo with last refill  to f/u any worsening symptoms or concerns

## 2015-05-28 NOTE — Assessment & Plan Note (Signed)
stable overall by history and exam, recent data reviewed with pt, and pt to continue medical treatment as before,  to f/u any worsening symptoms or concerns Lab Results  Component Value Date   HGBA1C 5.8 05/19/2015   

## 2015-05-28 NOTE — Progress Notes (Signed)
Subjective:    Patient ID: [name], male    DOB: Apr 14, 1933, 81 y.o.   MRN: [id number]  HPI Here to f/u; overall doing ok,  Pt denies chest pain, increasing sob or doe, wheezing, orthopnea, PND, increased LE swelling, palpitations, dizziness or syncope.  Pt denies new neurological symptoms such as new headache, or facial or extremity weakness or numbness.  Pt denies polydipsia, polyuria, or low sugar episode.   Pt denies new neurological symptoms such as new headache, or facial or extremity weakness or numbness.   Pt states overall good compliance with meds, mostly trying to follow appropriate diet, with wt overall stable,  but little exercise however. Due for evaluation next wk for GB removal  No new complaints Asks for percocet refill only for very occas use for RLS symptoms at night, last refill was for #60 approx 18 mo ago Past Medical History  Diagnosis Date  . Obesity (BMI 30-39.9)   . Restless leg syndrome   . Hypertensive heart disease   . CAD (coronary artery disease), native coronary artery     a. CAD s/p PTCA of LCx and OM bifurcation 2010. b. Residual disease by cath 02/2015 - med rx for now, reserving PCI for recurrent sx.  Marland Kitchen History of pericarditis 2007    a. h/o MSSA, s/p pericardial window.  Marland Kitchen GERD   . TIA (transient ischemic attack) 2005  . BPH (benign prostatic hypertrophy)     "minor"  . Lumbar spinal stenosis   . Unspecified venous (peripheral) insufficiency   . Stroke [facility]) May 2003  . Anxiety   . Depression   . Aortic stenosis     a. s/p TAVR 03/2015.  . Carotid artery disease (HCC)     Prior TIA 2006 with left CEA by Dr. Arbie Cookey Recurrent TIA in April 2011   . Lumbar disc disease   . CKD (chronic kidney disease), stage III 02/08/2014  . Hypertension   . 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 Chronic diastolic CHF (congestive heart failure)  (HCC)   . LBBB (left bundle branch block)     a. Intermittent LBBB on EKGs.  . Statin intolerance   . Heart murmur   . Myocardial infarction [facility]) ~ 2005; 2011    "light"  . Pneumonia 12/2014  . OSA on CPAP   . On home oxygen therapy     "I take 2L at night"  . Type 2 diabetes mellitus with vascular disease (HCC)   . Osteoarthritis     "qwhere"  . Chronic lower back pain   . History of gout     "took IV treatment for 1 yr~ 2010; not suppose to have trouble w/gout for 15 years; haven't had problems since treatment" (05/19/2015)  . Acute respiratory failure with hypoxia (HCC) 05/2014; 12/2014    "vented ~ 5days; Vented ~ 14days"   Past Surgical History  Procedure Laterality Date  . Pericardial window  2007  . Shoulder arthroscopy Left 2008  . Shoulder surgery Right 1970's    "took out part of the joint and the bursa"  . Cataract extraction w/ intraocular lens  implant, bilateral Bilateral     Left side x's 2 and right  . Retinal detachment surgery Left   . Carotid endarterectomy Left 12-06-05  . Coronary angioplasty with stent placement  01/2009    x 1 stent  .  Colonoscopy    . Carpal tunnel release Bilateral   . Lumbar spine surgery  05/2012    "@ [place], had arthritis real bad; took out lots of bone spurs"  . Inguinal hernia repair Left 10/01/2013    Procedure: HERNIA REPAIR INGUINAL ADULT;  Surgeon: Axel Filler, MD;  Location: [facility] ORS;  Service: General;  Laterality: Left;  . Insertion of mesh Left 10/01/2013    Procedure: INSERTION OF MESH;  Surgeon: Axel Filler, MD;  Location: [facility] ORS;  Service: General;  Laterality: Left;  . Finger amputation Right 11/2013    JUST TO FIRST JOINT RIGHT HAND  LAST FINGER ( Right 5th finger)  . Peripheral vascular catheterization N/A 01/08/2015    Procedure: Abdominal Aortogram;  Surgeon: Chuck Hint, MD;  Location: [facility] INVASIVE CV LAB;  Service: Cardiovascular;  Laterality: N/A;  . Cardiac catheterization N/A 03/15/2015     Procedure: Right/Left Heart Cath and Coronary Angiography;  Surgeon: Tonny Bollman, MD;  Location: [facility] INVASIVE CV LAB;  Service: Cardiovascular;  Laterality: N/A;  . Transcatheter aortic valve replacement, transfemoral N/A 04/06/2015    Procedure: TRANSCATHETER AORTIC VALVE REPLACEMENT, TRANSFEMORAL;  Surgeon: Tonny Bollman, MD;  Location: [facility] OR;  Service: Open Heart Surgery;  Laterality: N/A;  . Tee without cardioversion N/A 04/06/2015    Procedure: TRANSESOPHAGEAL ECHOCARDIOGRAM (TEE);  Surgeon: Tonny Bollman, MD;  Location: [facility] OR;  Service: Open Heart Surgery;  Laterality: N/A;  . Cataract extraction w/ intraocular lens implant      "botched it the first time"  . Eye surgery    . Inguinal hernia repair    . Back surgery    . Cardiac valve replacement      reports that he has never smoked. He has never used smokeless tobacco. He reports that he does not drink alcohol or use illicit drugs. family history includes Breast cancer in his sister; Cancer in his brother and sister; Diabetes in his daughter, father, and son; Heart attack in his mother; Heart disease in his father and mother; Hypertension in his son; Lung cancer in his brother; Prostate cancer in his brother. Allergies  Allergen Reactions  . Statins Other (See Comments)    Severe leg myalgias, weakness   Current Outpatient Prescriptions on File Prior to Visit  Medication Sig Dispense Refill  . acetaminophen (TYLENOL) 325 MG tablet Take 650 mg by mouth every 6 (six) hours as needed for mild pain or moderate pain.    Marland Kitchen allopurinol (ZYLOPRIM) 300 MG tablet Take 1 tablet (300 mg total) by mouth every morning. 90 tablet 3  . amLODipine (NORVASC) 5 MG tablet Take 5 mg by mouth daily.     Marland Kitchen aspirin EC 81 MG tablet Take 81 mg by mouth daily.    Marland Kitchen buPROPion (WELLBUTRIN XL) 150 MG 24 hr tablet TAKE ONE TABLET BY MOUTH EVERY MORNING 90 tablet 0  . cholecalciferol (VITAMIN D) 1000 UNITS tablet Take 1,000 Units by mouth daily.    .  clopidogrel (PLAVIX) 75 MG tablet Take 1 tablet (75 mg total) by mouth daily. 90 tablet 3  . docusate sodium 100 MG CAPS Take 100 mg by mouth 2 (two) times daily. 10 capsule 0  . Ferrous Gluconate 256 (28 FE) MG TABS Take 256 mg by mouth daily.    . furosemide (LASIX) 40 MG tablet Take 1 tablet (40 mg total) by mouth 2 (two) times daily. 60 tablet 3  . LORazepam (ATIVAN) 1 MG tablet Take 1 tablet (1 mg total) by  mouth 2 (two) times daily as needed for anxiety. 30 tablet 0  . Magnesium 250 MG TABS Take 250 mg by mouth daily.     . metoprolol succinate (TOPROL-XL) 25 MG 24 hr tablet Take 1 tablet (25 mg total) by mouth daily. Take with or immediately following a meal. 30 tablet 11  . mirtazapine (REMERON) 15 MG tablet Take 1 tablet (15 mg total) by mouth at bedtime. 30 tablet 11  . nitroGLYCERIN (NITROSTAT) 0.4 MG SL tablet Place 0.4 mg under the tongue every 5 (five) minutes as needed for chest pain (3 doses MAX).    Marland Kitchen pantoprazole (PROTONIX) 40 MG tablet Take 1 tablet (40 mg total) by mouth 2 (two) times daily. 90 tablet 1  . pregabalin (LYRICA) 200 MG capsule Take 1 capsule (200 mg total) by mouth at bedtime. 90 capsule 1  . rOPINIRole (REQUIP) 4 MG tablet Take 1 tablet (4 mg total) by mouth at bedtime. 90 tablet 1  . tamsulosin (FLOMAX) 0.4 MG CAPS capsule Take 1 capsule (0.4 mg total) by mouth daily. 90 capsule 3   No current facility-administered medications on file prior to visit.    Review of Systems  Constitutional: Negative for unusual diaphoresis or night sweats HENT: Negative for ringing in ear or discharge Eyes: Negative for double vision or worsening visual disturbance.  Respiratory: Negative for choking and stridor.   Gastrointestinal: Negative for vomiting or other signifcant bowel change Genitourinary: Negative for hematuria or change in urine volume.  Musculoskeletal: Negative for other MSK pain or swelling Skin: Negative for color change and worsening wound.  Neurological:  Negative for tremors and numbness other than noted  Psychiatric/Behavioral: Negative for decreased concentration or agitation other than above       Objective:   Physical Exam BP 146/88 mmHg  Pulse 67  Temp(Src) 97.9 F (36.6 C) (Oral)  Ht  (1.778 m)  Wt 239 lb (108.41 kg)  BMI 34.29 kg/m2  SpO2 96% VS noted,  Constitutional: Pt appears in no significant distress HENT: Head: NCAT.  Right Ear: External ear normal.  Left Ear: External ear normal.  Eyes: . Pupils are equal, round, and reactive to light. Conjunctivae and EOM are normal Neck: Normal range of motion. Neck supple.  Cardiovascular: Normal rate and regular rhythm.   Pulmonary/Chest: Effort normal and breath sounds without rales or wheezing.  Abd:  Soft, NT, ND, + BS Neurological: Pt is alert. Not confused , motor grossly intact Skin: Skin is warm. No rash, no LE edema Psychiatric: Pt behavior is normal. No agitation.     Assessment & Plan:

## 2015-05-28 NOTE — Assessment & Plan Note (Signed)
stable overall by history and exam, recent data reviewed with pt, and pt to continue medical treatment as before,  to f/u any worsening symptoms or concerns Lab Results  Component Value Date   LDLCALC 128* 07/15/2014   Declines med changes today, for lower chol diet

## 2015-06-02 ENCOUNTER — Other Ambulatory Visit: Payer: Self-pay | Admitting: Internal Medicine

## 2015-06-04 ENCOUNTER — Ambulatory Visit: Payer: Self-pay | Admitting: Surgery

## 2015-06-04 ENCOUNTER — Encounter (HOSPITAL_BASED_OUTPATIENT_CLINIC_OR_DEPARTMENT_OTHER): Payer: Medicare Other | Attending: Internal Medicine

## 2015-06-04 DIAGNOSIS — M109 Gout, unspecified: Secondary | ICD-10-CM | POA: Insufficient documentation

## 2015-06-04 DIAGNOSIS — G4730 Sleep apnea, unspecified: Secondary | ICD-10-CM | POA: Insufficient documentation

## 2015-06-04 DIAGNOSIS — I2510 Atherosclerotic heart disease of native coronary artery without angina pectoris: Secondary | ICD-10-CM | POA: Insufficient documentation

## 2015-06-04 DIAGNOSIS — E11621 Type 2 diabetes mellitus with foot ulcer: Secondary | ICD-10-CM | POA: Diagnosis not present

## 2015-06-04 DIAGNOSIS — I252 Old myocardial infarction: Secondary | ICD-10-CM | POA: Diagnosis not present

## 2015-06-04 DIAGNOSIS — I509 Heart failure, unspecified: Secondary | ICD-10-CM | POA: Diagnosis not present

## 2015-06-04 DIAGNOSIS — Z952 Presence of prosthetic heart valve: Secondary | ICD-10-CM | POA: Insufficient documentation

## 2015-06-04 DIAGNOSIS — E1151 Type 2 diabetes mellitus with diabetic peripheral angiopathy without gangrene: Secondary | ICD-10-CM | POA: Insufficient documentation

## 2015-06-04 DIAGNOSIS — E1140 Type 2 diabetes mellitus with diabetic neuropathy, unspecified: Secondary | ICD-10-CM | POA: Insufficient documentation

## 2015-06-04 DIAGNOSIS — L97821 Non-pressure chronic ulcer of other part of left lower leg limited to breakdown of skin: Secondary | ICD-10-CM | POA: Diagnosis not present

## 2015-06-04 DIAGNOSIS — L97512 Non-pressure chronic ulcer of other part of right foot with fat layer exposed: Secondary | ICD-10-CM | POA: Insufficient documentation

## 2015-06-04 DIAGNOSIS — E1136 Type 2 diabetes mellitus with diabetic cataract: Secondary | ICD-10-CM | POA: Diagnosis not present

## 2015-06-04 DIAGNOSIS — I872 Venous insufficiency (chronic) (peripheral): Secondary | ICD-10-CM | POA: Diagnosis not present

## 2015-06-04 DIAGNOSIS — I110 Hypertensive heart disease with heart failure: Secondary | ICD-10-CM | POA: Insufficient documentation

## 2015-06-04 NOTE — H&P (Signed)
History of Present Illness (Vivia Rosenburg K. Loye Vento MD; 06/04/2015 12:27 PM) The patient is a 81 year old male who presents to discuss consultation. Referred by Dr. Spencer Tilley for evaluation of gallbladder disease. He was seen as a consult in the hospital.  This is an 81 yo white male with a h/o severe AS who underwent a TAVR about 1 month ago. He has done well with this. He is on plavix and ASA (the plavix has been in place since some stents were placed in 2011). He is on O2 at night per his chart.   The patient went to eat a sausage biscuit recently. He then developed epigastric abdominal pain. He call EMS given his heart history. He states really by the time they were bringing him to the hospital his pain had almost dissipated. He denied any nausea or vomiting yesterday, but states over the last 2 months or so, he has intermittently developed some mild post prandial epigastric discomfort and occasional nausea, but these would all go away on their own and rather quickly. Upon arrival, he was admitted to rule out a cardiac cause for his symptoms. This was done and then an abdominal US was ordered that revealed sludge, but no stones or evidence for acute cholecystitis. his WBC is normal and his LFTs were mildly elevated, but are trending down for the most part. We have been asked to see the patient today for evaluation of cholecystectomy.   Most recent LFT's showed normal t bili, but elevated AST, ALT.  CLINICAL DATA: Epigastric abdominal pain for 1 day.  EXAM: ULTRASOUND ABDOMEN COMPLETE  COMPARISON: CT angiogram of the chest, abdomen and pelvis from 03/31/2015.  FINDINGS: Gallbladder: Nondistended gallbladder is filled with sludge, with no shadowing gallstones demonstrated. No gallbladder wall thickening or pericholecystic fluid. No sonographic Murphy sign .  Common bile duct: Diameter: 5 mm  Liver: Liver parenchyma is diffusely markedly echogenic with posterior acoustic  attenuation, in keeping with severe diffuse hepatic steatosis. No liver mass detected, noting significantly decreased sensitivity in the setting of severe steatosis. No liver surface irregularity demonstrated.  IVC: No abnormality visualized.  Pancreas: Poorly visualized due to overlying gas and patient related factors.  Spleen: Size and appearance within normal limits.  Right Kidney: Length: 11.3 cm. Echogenicity within normal limits. No mass or hydronephrosis visualized.  Left Kidney: Length: 11.9 cm. Echogenicity within normal limits. No mass or hydronephrosis visualized.  Abdominal aorta: No aneurysm visualized.  Other findings: None.  IMPRESSION: 1. Severe diffuse hepatic steatosis. 2. Sludge filled gallbladder with no cholelithiasis and no evidence of acute cholecystitis. 3. No biliary ductal dilatation.   Electronically Signed By: Jason A Poff M.D. On: 05/20/2015 07:52    Other Problems (Ashley Beck, CMA; 06/04/2015 10:19 AM) Arthritis Back Pain Cerebrovascular Accident Chest pain Diabetes Mellitus Gastroesophageal Reflux Disease Heart murmur Hemorrhoids High blood pressure Hypercholesterolemia Inguinal Hernia Other disease, cancer, significant illness Sleep Apnea Vascular Disease  Past Surgical History (Ashley Beck, CMA; 06/04/2015 10:19 AM) Carotid Artery Surgery Left. Cataract Surgery Bilateral. Laparoscopic Inguinal Hernia Surgery Left. Oral Surgery Shoulder Surgery Bilateral. Spinal Surgery - Lower Back Valve Replacement  Diagnostic Studies History (Ashley Beck, CMA; 06/04/2015 10:19 AM) Colonoscopy 5-10 years ago  Allergies (Ashley Beck, CMA; 06/04/2015 10:20 AM) Statins Depletion *DIETARY PRODUCTS/DIETARY MANAGEMENT PRODUCTS*  Medication History (Ashley Beck, CMA; 06/04/2015 10:22 AM) Allopurinol (300MG Tablet, Oral) Active. AmLODIPine Besylate (5MG Tablet, Oral) Active. BuPROPion HCl ER (XL) (150MG Tablet  ER 24HR, Oral) Active. Clopidogrel Bisulfate (75MG Tablet, Oral) Active.   Furosemide (80MG  Tablet, Oral) Active. LORazepam (1MG  Tablet, Oral) Active. Metoprolol Succinate ER (25MG  Tablet ER 24HR, Oral) Active. Mirtazapine (15MG  Tablet, Oral) Active. Nitrostat (0.4MG  Tab Sublingual, Sublingual) Active. Oxycodone-Acetaminophen (10-325MG  Tablet, Oral) Active. Pantoprazole Sodium (40MG  Tablet DR, Oral) Active. ROPINIRole HCl (4MG  Tablet, Oral) Active. Myrbetriq (50MG  Tablet ER 24HR, Oral) Active. Flomax (0.4MG  Capsule, Oral) Active. Aspirin (81MG  Tablet, Oral) Active. Medications Reconciled  Social History Fay Records, [place]; 06/04/2015 10:19 AM) Caffeine use Carbonated beverages, Coffee. No alcohol use No drug use Tobacco use Never smoker.  Family History Fay Records, [place]; 06/04/2015 10:19 AM) Family history unknown First Degree Relatives     Review of Systems Fay Records CMA; 06/04/2015 10:19 AM) General Not Present- Appetite Loss, Chills, Fatigue, Fever, Night Sweats, Weight Gain and Weight Loss. Skin Present- New Lesions, Non-Healing Wounds and Ulcer. Not Present- Change in Wart/Mole, Dryness, Hives, Jaundice and Rash. HEENT Present- Wears glasses/contact lenses. Not Present- Earache, Hearing Loss, Hoarseness, Nose Bleed, Oral Ulcers, Ringing in the Ears, Seasonal Allergies, Sinus Pain, Sore Throat, Visual Disturbances and Yellow Eyes. Respiratory Present- Snoring. Not Present- Bloody sputum, Chronic Cough, Difficulty Breathing and Wheezing. Cardiovascular Present- Shortness of Breath. Not Present- Chest Pain, Difficulty Breathing Lying Down, Leg Cramps, Palpitations, Rapid Heart Rate and Swelling of Extremities. Gastrointestinal Not Present- Abdominal Pain, Bloating, Bloody Stool, Change in Bowel Habits, Chronic diarrhea, Constipation, Difficulty Swallowing, Excessive gas, Gets full quickly at meals, Hemorrhoids, Indigestion, Nausea, Rectal Pain and  Vomiting. Male Genitourinary Present- Frequency. Not Present- Blood in Urine, Change in Urinary Stream, Impotence, Nocturia, Painful Urination, Urgency and Urine Leakage. Musculoskeletal Present- Back Pain and Joint Pain. Not Present- Joint Stiffness, Muscle Pain, Muscle Weakness and Swelling of Extremities. Neurological Present- Tingling, Trouble walking and Weakness. Not Present- Decreased Memory, Fainting, Headaches, Numbness, Seizures and Tremor. Psychiatric Not Present- Anxiety, Bipolar, Change in Sleep Pattern, Depression, Fearful and Frequent crying. Hematology Present- Easy Bruising. Not Present- Excessive bleeding, Gland problems, HIV and Persistent Infections.  Vitals Fay Records CMA; 06/04/2015 10:22 AM) 06/04/2015 10:22 AM Weight: 237.6 lb Height: 70in Body Surface Area: 2.25 m Body Mass Index: 34.09 kg/m  Temp.: 97.34F(Temporal)  Pulse: 92 (Regular)  BP: 140/76 (Sitting, Left Arm, Standard)      Physical Exam Molli Hazard K. Khaleelah Yowell MD; 06/04/2015 12:27 PM)  The physical exam findings are as follows: Note:General: pleasant, obese white male who is laying in bed in NAD HEENT: head is normocephalic, atraumatic. Sclera are noninjected. PERRL. Ears and nose without any masses or lesions. Mouth is pink and moist Heart: regular, rate, and rhythm. Normal s1,s2. No obvious gallops, or rubs noted. + murmur Palpable radial and pedal pulses bilaterally Lungs: CTAB, no wheezes, rhonchi, or rales noted. Respiratory effort nonlabored Abd: soft, minimal epigastric discomfort, ND, but obese, +BS, no masses, hernias, or organomegaly. Rectus diastasis present MS: all 4 extremities are symmetrical with no cyanosis, clubbing, or edema. Skin: warm and dry with no masses, lesions, or rashes Psych: A&Ox3 with an appropriate affect.    Assessment & Plan Wilmon Arms. Juris Gosnell MD; 06/04/2015 10:49 AM)  CHRONIC CHOLECYSTITIS WITH CALCULUS (K80.10)  Current Plans Schedule for  Surgery - Laparoscopic cholecystectomy with intraoperative cholangiogram. The surgical procedure has been discussed with the patient. Potential risks, benefits, alternative treatments, and expected outcomes have been explained. All of the patient's questions at this time have been answered. The likelihood of reaching the patient's treatment goal is good. The patient understand the proposed surgical procedure and wishes to proceed.   Cardiac clearance from Dr. Donnie Aho needed  [name], MD, [facility]  General/ Trauma Surgery  06/04/2015 12:28 PM

## 2015-06-07 ENCOUNTER — Telehealth: Payer: Self-pay

## 2015-06-07 NOTE — Telephone Encounter (Signed)
[name] with HH:   Pt fell: going from bedroom to couch. Pt felt himself get weak and he lowered himself to the ground. Once on the ground pt was not able to get up. He waited about an hour until his son was able to get over.

## 2015-06-08 NOTE — Telephone Encounter (Signed)
Ok to offer appt, can see at 630 pm tonight but would need to be here very close or before appt time since last visit of the evening

## 2015-06-09 NOTE — Telephone Encounter (Signed)
Called patient.  Patient stated he does not need to come in.

## 2015-06-11 ENCOUNTER — Other Ambulatory Visit: Payer: Self-pay | Admitting: Internal Medicine

## 2015-06-11 ENCOUNTER — Ambulatory Visit (HOSPITAL_COMMUNITY)
Admission: RE | Admit: 2015-06-11 | Discharge: 2015-06-11 | Disposition: A | Discharge status: Home / Self Care | Payer: Medicare Other | Source: Ambulatory Visit | Attending: Internal Medicine | Admitting: Internal Medicine

## 2015-06-11 DIAGNOSIS — I872 Venous insufficiency (chronic) (peripheral): Secondary | ICD-10-CM | POA: Diagnosis not present

## 2015-06-11 DIAGNOSIS — L97519 Non-pressure chronic ulcer of other part of right foot with unspecified severity: Secondary | ICD-10-CM | POA: Diagnosis not present

## 2015-06-11 DIAGNOSIS — M19071 Primary osteoarthritis, right ankle and foot: Secondary | ICD-10-CM | POA: Diagnosis not present

## 2015-06-11 DIAGNOSIS — M86171 Other acute osteomyelitis, right ankle and foot: Secondary | ICD-10-CM | POA: Diagnosis not present

## 2015-06-11 DIAGNOSIS — L97512 Non-pressure chronic ulcer of other part of right foot with fat layer exposed: Secondary | ICD-10-CM | POA: Diagnosis not present

## 2015-06-11 DIAGNOSIS — L97821 Non-pressure chronic ulcer of other part of left lower leg limited to breakdown of skin: Secondary | ICD-10-CM | POA: Diagnosis not present

## 2015-06-11 DIAGNOSIS — E11621 Type 2 diabetes mellitus with foot ulcer: Secondary | ICD-10-CM | POA: Diagnosis not present

## 2015-06-23 ENCOUNTER — Telehealth: Payer: Self-pay | Admitting: Internal Medicine

## 2015-06-23 NOTE — Telephone Encounter (Signed)
Verbal authorization given via confidential VM per MD protocol 

## 2015-06-23 NOTE — Telephone Encounter (Signed)
Marchelle Folks with [facility] [phone]  Need Verbal for reverification for cardio/plumonary and med education   And continue to wound care though wound care center

## 2015-06-23 NOTE — Telephone Encounter (Signed)
Marchelle Folks called back and had not received the voicemail. Carlyon Shadow the verbal okay per notes below.

## 2015-06-25 ENCOUNTER — Encounter (HOSPITAL_COMMUNITY)
Admission: RE | Admit: 2015-06-25 | Discharge: 2015-06-25 | Disposition: A | Discharge status: Home / Self Care | Payer: Medicare Other | Source: Ambulatory Visit | Attending: Surgery | Admitting: Surgery

## 2015-06-25 ENCOUNTER — Encounter (HOSPITAL_COMMUNITY): Payer: Self-pay

## 2015-06-25 ENCOUNTER — Encounter (HOSPITAL_BASED_OUTPATIENT_CLINIC_OR_DEPARTMENT_OTHER): Payer: Medicare Other

## 2015-06-25 DIAGNOSIS — G4733 Obstructive sleep apnea (adult) (pediatric): Secondary | ICD-10-CM | POA: Insufficient documentation

## 2015-06-25 DIAGNOSIS — I130 Hypertensive heart and chronic kidney disease with heart failure and stage 1 through stage 4 chronic kidney disease, or unspecified chronic kidney disease: Secondary | ICD-10-CM | POA: Diagnosis not present

## 2015-06-25 DIAGNOSIS — Z01818 Encounter for other preprocedural examination: Secondary | ICD-10-CM | POA: Insufficient documentation

## 2015-06-25 DIAGNOSIS — Z8673 Personal history of transient ischemic attack (TIA), and cerebral infarction without residual deficits: Secondary | ICD-10-CM | POA: Diagnosis not present

## 2015-06-25 DIAGNOSIS — Z7982 Long term (current) use of aspirin: Secondary | ICD-10-CM | POA: Diagnosis not present

## 2015-06-25 DIAGNOSIS — Z79899 Other long term (current) drug therapy: Secondary | ICD-10-CM | POA: Diagnosis not present

## 2015-06-25 DIAGNOSIS — I2510 Atherosclerotic heart disease of native coronary artery without angina pectoris: Secondary | ICD-10-CM | POA: Insufficient documentation

## 2015-06-25 DIAGNOSIS — Z01812 Encounter for preprocedural laboratory examination: Secondary | ICD-10-CM | POA: Insufficient documentation

## 2015-06-25 DIAGNOSIS — E785 Hyperlipidemia, unspecified: Secondary | ICD-10-CM | POA: Insufficient documentation

## 2015-06-25 DIAGNOSIS — E1122 Type 2 diabetes mellitus with diabetic chronic kidney disease: Secondary | ICD-10-CM | POA: Insufficient documentation

## 2015-06-25 DIAGNOSIS — I350 Nonrheumatic aortic (valve) stenosis: Secondary | ICD-10-CM | POA: Insufficient documentation

## 2015-06-25 DIAGNOSIS — K219 Gastro-esophageal reflux disease without esophagitis: Secondary | ICD-10-CM | POA: Diagnosis not present

## 2015-06-25 DIAGNOSIS — K811 Chronic cholecystitis: Secondary | ICD-10-CM | POA: Diagnosis not present

## 2015-06-25 DIAGNOSIS — I872 Venous insufficiency (chronic) (peripheral): Secondary | ICD-10-CM | POA: Diagnosis not present

## 2015-06-25 DIAGNOSIS — I5030 Unspecified diastolic (congestive) heart failure: Secondary | ICD-10-CM | POA: Diagnosis not present

## 2015-06-25 DIAGNOSIS — Z7902 Long term (current) use of antithrombotics/antiplatelets: Secondary | ICD-10-CM | POA: Diagnosis not present

## 2015-06-25 DIAGNOSIS — N183 Chronic kidney disease, stage 3 (moderate): Secondary | ICD-10-CM | POA: Insufficient documentation

## 2015-06-25 DIAGNOSIS — Z955 Presence of coronary angioplasty implant and graft: Secondary | ICD-10-CM | POA: Insufficient documentation

## 2015-06-25 LAB — BASIC METABOLIC PANEL
ANION GAP: 6 (ref 5–15)
BUN: 18 mg/dL (ref 6–20)
CALCIUM: 9.3 mg/dL (ref 8.9–10.3)
CO2: 28 mmol/L (ref 22–32)
Chloride: 106 mmol/L (ref 101–111)
Creatinine, Ser: 1.31 mg/dL — ABNORMAL HIGH (ref 0.61–1.24)
GFR, EST AFRICAN AMERICAN: 58 mL/min — AB (ref >60)
GFR, EST NON AFRICAN AMERICAN: 50 mL/min — AB (ref >60)
Glucose, Bld: 127 mg/dL — ABNORMAL HIGH (ref 65–99)
Potassium: 4.6 mmol/L (ref 3.5–5.1)
Sodium: 140 mmol/L (ref 135–145)

## 2015-06-25 LAB — CBC
HCT: 44.8 % (ref 39.0–52.0)
Hemoglobin: 14.3 g/dL (ref 13.0–17.0)
MCH: 30 pg (ref 26.0–34.0)
MCHC: 31.9 g/dL (ref 30.0–36.0)
MCV: 94.1 fL (ref 78.0–100.0)
PLATELETS: 116 10*3/uL — AB (ref 150–400)
RBC: 4.76 MIL/uL (ref 4.22–5.81)
RDW: 14.5 % (ref 11.5–15.5)
WBC: 6 10*3/uL (ref 4.0–10.5)

## 2015-06-25 LAB — GLUCOSE, CAPILLARY: GLUCOSE-CAPILLARY: 132 mg/dL — AB (ref 65–99)

## 2015-06-25 NOTE — Progress Notes (Addendum)
PCP: Dr. Oliver Barre Cardiologist: Dr. Arlyn Leak- will request notes/clearance Sleep study done years ago is followed by [facility]. For sleep apnea Pt. Stated he saw Dr. Shelle Iron 1/16 but states Dr. Shelle Iron is no longer with [facility]. Hasn't seen anyone else. Pt. Is on oxygen 2 liters at night ,also uses cpap machine.Pt. States he is short of breath all the time,states his breathing is bether since the TVAR surgery on 9/16.  Pt. States Dr. Corliss Skains stated to stop asa,plavix on 06/24/15.   SPoke with Dr. Okey Dupre if he needed to see pt. At pre-adm. Appointment concerning his sob, cardiac hx. Stated to get clearance notes form Dr. Donnie Aho and he did not need to see him.  Pt. States he doesn't check blood sugars at home and is not diabetic

## 2015-06-25 NOTE — Pre-Procedure Instructions (Signed)
[name]  06/25/2015      [facility] - [place] - [address] [name] [address] Phone: [phone] Fax: [phone]    Your procedure is scheduled on Tuesday, June 29, 2015  Report to [facility] Admitting at 9:00 A.M.  Call this number if you have problems the morning of surgery:  [phone]   Remember:  Do not eat food or drink liquids after midnight Monday, June 28, 2015  Take these medicines the morning of surgery with A SIP OF WATER : allopurinol (ZYLOPRIM), amLODipine (NORVASC), buPROPion (WELLBUTRIN XL), metoprolol succinate (TOPROL-XL),  pantoprazole (PROTONIX), tamsulosin (FLOMAX), if needed: tylenol or Oxycodone for pain, nitroGLYCERIN (NITROSTAT) for chest pain, LORazepam (ATIVAN) for anxiety   Stop taking Aspirin, Plavix, vitamins, fish oil, and herbal medications. Do not take any NSAIDs ie: Ibuprofen, Advil, Naproxen or any medication containing Aspirin; stop now.  How to Manage Your Diabetes Before Surgery Why is it important to control my blood sugar before and after surgery?   Improving blood sugar levels before and after surgery helps healing and can limit problems.  A way of improving blood sugar control is eating a healthy diet by:  - Eating less sugar and carbohydrates  - Increasing activity/exercise  - Talk with your doctor about reaching your blood sugar goals  High blood sugars (greater than 180 mg/dL) can raise your risk of infections and slow down your recovery so you will need to focus on controlling your diabetes during the weeks before surgery.  Make sure that the doctor who takes care of your diabetes knows about your planned surgery including the date and location.  How do I manage my blood sugars before surgery?   Check your blood sugar at least 4 times a day, 2 days before surgery to make sure that they are not too high or low.   Check your  blood sugar the morning of your surgery when you wake up and every 2 hours until you get to the Short-Stay unit.  If your blood sugar is less than 70 mg/dL, you will need to treat for low blood sugar by:  Treat a low blood sugar (less than 70 mg/dL) with 1/2 cup of clear juice (cranberry or apple), 4 glucose tablets, OR glucose gel.  Recheck blood sugar in 15 minutes after treatment (to make sure it is greater than 70 mg/dL).  If blood sugar is not greater than 70 mg/dL on re-check, call [phone] for further instructions.   Report your blood sugar to the Short-Stay nurse when you get to Short-Stay.  References:  [facility], 2007 "How to Manage your Diabetes Before and After Surgery".   Do not wear jewelry.  Do not wear lotions, powders, or perfumes.  You may not wear deodorant.  Do not shave 48 hours prior to surgery.  Men may shave face and neck.  Do not bring valuables to the hospital.  [facility] is not responsible for any belongings or valuables.  Contacts, dentures or bridgework may not be worn into surgery.  Leave your suitcase in the car.  After surgery it may be brought to your room.  For patients admitted to the hospital, discharge time will be determined by your treatment team.  Patients discharged the day of surgery will not be allowed to drive home.   Name and phone number of your driver:   Special instructions: Shower the night before  surgery and the morning of surgery with CHG.  Please read over the following fact sheets that you were given. Pain Booklet, Coughing and Deep Breathing and Surgical Site Infection Prevention

## 2015-06-28 DIAGNOSIS — I130 Hypertensive heart and chronic kidney disease with heart failure and stage 1 through stage 4 chronic kidney disease, or unspecified chronic kidney disease: Secondary | ICD-10-CM | POA: Diagnosis not present

## 2015-06-28 DIAGNOSIS — E1122 Type 2 diabetes mellitus with diabetic chronic kidney disease: Secondary | ICD-10-CM | POA: Diagnosis not present

## 2015-06-28 DIAGNOSIS — I5030 Unspecified diastolic (congestive) heart failure: Secondary | ICD-10-CM | POA: Diagnosis not present

## 2015-06-28 DIAGNOSIS — N183 Chronic kidney disease, stage 3 (moderate): Secondary | ICD-10-CM | POA: Diagnosis not present

## 2015-06-28 DIAGNOSIS — E11621 Type 2 diabetes mellitus with foot ulcer: Secondary | ICD-10-CM

## 2015-06-28 MED ORDER — CHLORHEXIDINE GLUCONATE 4 % EX LIQD: 1.0000 "application " | Freq: Once | CUTANEOUS | Status: DC

## 2015-06-28 MED ORDER — CEFAZOLIN SODIUM-DEXTROSE 2-3 GM-% IV SOLR
2.0000 g | INTRAVENOUS | Status: AC
  Administered 2015-06-29: 2 g via INTRAVENOUS
  Filled 2015-06-28: qty 50

## 2015-06-28 NOTE — Progress Notes (Signed)
Anesthesia Chart Review: Patient is an 81 year old male scheduled for laparoscopic cholecystectomy on 06/29/15 by Dr. Corliss Skains.   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 BMI is consistent with obesity. PCP is Dr. [name]. Pulmonologist is Dr. Shelle Iron. Vascular surgeon is Dr. Arbie Cookey. Primary cardiologist is Dr. Donnie Aho who felt it was okay to proceed with surgery and discontinue Plavix 7 days prior. He had also discussed with Dr. Excell Seltzer.   Meds include amlodipine, ASA 81 mg, Wellbutrin Xl, Plavix, 28 Fe, Lasix, Ativan, Magnesium, Toprol XL, Myrbetriq, Remeron, Nitro, Percocet, Protonix, Lyrica, Requip, Flomax.  05/20/15 EKG: NSR.  05/17/15 Echo: Study Conclusions - Left ventricle: The cavity size was normal. There was moderate concentric hypertrophy. Systolic function was normal. The estimated ejection fraction was in the range of 55% to 60%. Wall motion was normal; there were no regional wall motion abnormalities. Doppler parameters are consistent with abnormal left ventricular relaxation (grade 1 diastolic dysfunction). The E/e&' ratio is >20, suggesting elevated LV filling pressure. - Aortic valve: Stent valve- well-seated. No paravalvular leak. Mean gradient (S): 18 mm Hg. Peak gradient (S): 33 mm Hg. - Mitral valve: Calcified annulus. There was trivial regurgitation. - Left atrium: Mildly dilated at 36 ml/m2. - Right ventricle: The cavity size was mildly  dilated. Systolic function was normal. - Right atrium: The atrium was mildly dilated. - Tricuspid valve: There was trivial regurgitation. - Pulmonary arteries: PA peak pressure: 29 mm Hg (S). - Inferior vena cava: The vessel was normal in size. The respirophasic diameter changes were in the normal range (>= 50%), consistent with normal central venous pressure. Impressions: - Compared to the prior study (immediately post-TAVR), the mean gradient across the AVR has increased to 18 mmHg, however, there does not appear to be any sign of obstruction.  03/15/15 Cardiac cath: - Mid RCA lesion, 60% stenosed. - Ost RCA lesion, 40% stenosed. - Ost LM to LM lesion, 30% stenosed. - Ost Cx lesion, 50% stenosed.  - Prox LAD to Mid LAD lesion, 40% stenosed.  - Ost 1st Mrg to 1st Mrg lesion, 20% stenosed. The lesion was previously treated with a stent (unknown type) greater than two years ago. 1. Moderate stenosis of the right coronary artery 2. Heavy calcification with mild nonobstructive stenosis of the LAD 3. Mild to moderate ostial left circumflex stenosis with continued patency of the stented segment in the first OM 4. Calcified aortic valve with hemodynamic findings consistent with severe aortic stenosis Recommend: Cardiac surgical evaluation for multidisciplinary approach to this patient with multiple comorbid medical conditions and severe symptomatic aortic stenosis.  08/18/14 Carotid duplex: Impression: 1. Unable to identify exact bifurcation due to acoustic shadow. There is evidence of significant stenosis in the distal common carotid artery and external carotid artery, unable to thoroughly evaluate proximal internal carotid artery; however, waveforms in the mid segment display post stenotic turbulence. 2. Widely patent left carotid endarterectomy without evidence of restenosis or hyperplasia. 3. Bilateral vertebral artery is antegrade.  03/31/15 PFTs: FVC 1.99 (56%), FEV1 1.56  (62%), DLCOunc 18.96 ()67%).  Preoperative labs noted. Cr 1.31, stable (  known CKD stage III, but with overall wide variation of Cr this year ranging from 0.86-2.71; primarily range has been 1.3 - 2.00 over the past year.) A1C 5.8 on 05/19/15. LFTs weren't repeated, but were trending down on 05/20/15.   If no acute changes then I anticipate that he can proceed as planned.  Velna Ochs [facility] Short Stay Center/Anesthesiology Phone [phone] 06/28/2015 10:09 AM

## 2015-06-29 ENCOUNTER — Encounter (HOSPITAL_COMMUNITY): Payer: Self-pay | Admitting: *Deleted

## 2015-06-29 ENCOUNTER — Observation Stay (HOSPITAL_COMMUNITY)
Admission: RE | Admit: 2015-06-29 | Discharge: 2015-06-30 | Disposition: A | Discharge status: Home / Self Care | Payer: Medicare Other | Source: Ambulatory Visit | Attending: Surgery | Admitting: Surgery

## 2015-06-29 ENCOUNTER — Encounter (HOSPITAL_COMMUNITY)
Admission: RE | Disposition: A | Discharge status: Home / Self Care | Payer: Self-pay | Source: Ambulatory Visit | Attending: Surgery

## 2015-06-29 ENCOUNTER — Ambulatory Visit (HOSPITAL_COMMUNITY): Payer: Medicare Other | Admitting: Vascular Surgery

## 2015-06-29 ENCOUNTER — Ambulatory Visit (HOSPITAL_COMMUNITY): Payer: Medicare Other | Admitting: Certified Registered Nurse Anesthetist

## 2015-06-29 DIAGNOSIS — K8010 Calculus of gallbladder with chronic cholecystitis without obstruction: Secondary | ICD-10-CM | POA: Diagnosis present

## 2015-06-29 DIAGNOSIS — K812 Acute cholecystitis with chronic cholecystitis: Secondary | ICD-10-CM | POA: Insufficient documentation

## 2015-06-29 DIAGNOSIS — Z419 Encounter for procedure for purposes other than remedying health state, unspecified: Secondary | ICD-10-CM

## 2015-06-29 DIAGNOSIS — I469 Cardiac arrest, cause unspecified: Secondary | ICD-10-CM | POA: Diagnosis not present

## 2015-06-29 HISTORY — PX: LAPAROSCOPIC CHOLECYSTECTOMY: SUR755

## 2015-06-29 HISTORY — PX: CHOLECYSTECTOMY: SHX55

## 2015-06-29 LAB — GLUCOSE, CAPILLARY
Glucose-Capillary: 92 mg/dL (ref 65–99)
Glucose-Capillary: 108 mg/dL — ABNORMAL HIGH (ref 65–99)
Glucose-Capillary: 129 mg/dL — ABNORMAL HIGH (ref 65–99)

## 2015-06-29 LAB — PROTIME-INR
INR: 1.08 (ref 0.00–1.49)
PROTHROMBIN TIME: 14.2 s (ref 11.6–15.2)

## 2015-06-29 SURGERY — LAPAROSCOPIC CHOLECYSTECTOMY WITH INTRAOPERATIVE CHOLANGIOGRAM
Anesthesia: General | Site: Abdomen

## 2015-06-29 MED ORDER — PANTOPRAZOLE SODIUM 40 MG PO TBEC
40.0000 mg | DELAYED_RELEASE_TABLET | Freq: Two times a day (BID) | ORAL | Status: DC
  Administered 2015-06-29 – 2015-06-30 (×2): 40 mg via ORAL
  Filled 2015-06-29 (×2): qty 1

## 2015-06-29 MED ORDER — FENTANYL CITRATE (PF) 100 MCG/2ML IJ SOLN
INTRAMUSCULAR | Status: DC | PRN
  Administered 2015-06-29: 50 ug via INTRAVENOUS
  Administered 2015-06-29: 100 ug via INTRAVENOUS

## 2015-06-29 MED ORDER — FUROSEMIDE 40 MG PO TABS
40.0000 mg | ORAL_TABLET | Freq: Two times a day (BID) | ORAL | Status: DC
  Administered 2015-06-29 – 2015-06-30 (×2): 40 mg via ORAL
  Filled 2015-06-29 (×2): qty 1

## 2015-06-29 MED ORDER — SODIUM CHLORIDE 0.9 % IV SOLN
INTRAVENOUS | Status: DC
  Administered 2015-06-29: 14:00:00 via INTRAVENOUS

## 2015-06-29 MED ORDER — DIPHENHYDRAMINE HCL 50 MG/ML IJ SOLN: 12.5000 mg | Freq: Four times a day (QID) | INTRAMUSCULAR | Status: DC | PRN

## 2015-06-29 MED ORDER — GLYCOPYRROLATE 0.2 MG/ML IJ SOLN
INTRAMUSCULAR | Status: DC | PRN
  Administered 2015-06-29: 0.1 mg via INTRAVENOUS
  Administered 2015-06-29: .8 mg via INTRAVENOUS

## 2015-06-29 MED ORDER — ALLOPURINOL 300 MG PO TABS
300.0000 mg | ORAL_TABLET | Freq: Every morning | ORAL | Status: DC
  Administered 2015-06-30: 300 mg via ORAL
  Filled 2015-06-29: qty 1

## 2015-06-29 MED ORDER — MIRABEGRON ER 50 MG PO TB24
50.0000 mg | ORAL_TABLET | Freq: Every day | ORAL | Status: DC
  Administered 2015-06-29 – 2015-06-30 (×2): 50 mg via ORAL
  Filled 2015-06-29 (×2): qty 1

## 2015-06-29 MED ORDER — EPHEDRINE SULFATE 50 MG/ML IJ SOLN
INTRAMUSCULAR | Status: DC | PRN
  Administered 2015-06-29 (×2): 10 mg via INTRAVENOUS

## 2015-06-29 MED ORDER — BUPIVACAINE-EPINEPHRINE (PF) 0.25% -1:200000 IJ SOLN
INTRAMUSCULAR | Status: AC
  Filled 2015-06-29: qty 30

## 2015-06-29 MED ORDER — SODIUM CHLORIDE 0.9 % IV SOLN: INTRAVENOUS | Status: DC | PRN

## 2015-06-29 MED ORDER — ROCURONIUM BROMIDE 50 MG/5ML IV SOLN
INTRAVENOUS | Status: AC
  Filled 2015-06-29: qty 1

## 2015-06-29 MED ORDER — SIMETHICONE 80 MG PO CHEW
40.0000 mg | CHEWABLE_TABLET | Freq: Four times a day (QID) | ORAL | Status: DC | PRN
  Administered 2015-06-29: 40 mg via ORAL
  Filled 2015-06-29: qty 1

## 2015-06-29 MED ORDER — FENTANYL CITRATE (PF) 250 MCG/5ML IJ SOLN
INTRAMUSCULAR | Status: AC
  Filled 2015-06-29: qty 5

## 2015-06-29 MED ORDER — ROCURONIUM BROMIDE 100 MG/10ML IV SOLN
INTRAVENOUS | Status: DC | PRN
  Administered 2015-06-29: 40 mg via INTRAVENOUS
  Administered 2015-06-29 (×2): 10 mg via INTRAVENOUS

## 2015-06-29 MED ORDER — CEFAZOLIN SODIUM-DEXTROSE 2-3 GM-% IV SOLR
2.0000 g | Freq: Three times a day (TID) | INTRAVENOUS | Status: AC
  Administered 2015-06-29: 2 g via INTRAVENOUS
  Filled 2015-06-29 (×3): qty 50

## 2015-06-29 MED ORDER — NEOSTIGMINE METHYLSULFATE 10 MG/10ML IV SOLN
INTRAVENOUS | Status: AC
  Filled 2015-06-29: qty 1

## 2015-06-29 MED ORDER — SUCCINYLCHOLINE CHLORIDE 20 MG/ML IJ SOLN
INTRAMUSCULAR | Status: DC | PRN
  Administered 2015-06-29: 120 mg via INTRAVENOUS

## 2015-06-29 MED ORDER — ZOLPIDEM TARTRATE 5 MG PO TABS
5.0000 mg | ORAL_TABLET | Freq: Every evening | ORAL | Status: DC | PRN
  Administered 2015-06-30: 5 mg via ORAL
  Filled 2015-06-29: qty 1

## 2015-06-29 MED ORDER — NEOSTIGMINE METHYLSULFATE 10 MG/10ML IV SOLN
INTRAVENOUS | Status: DC | PRN
  Administered 2015-06-29: 5 mg via INTRAVENOUS

## 2015-06-29 MED ORDER — DIPHENHYDRAMINE HCL 12.5 MG/5ML PO ELIX: 12.5000 mg | ORAL_SOLUTION | Freq: Four times a day (QID) | ORAL | Status: DC | PRN

## 2015-06-29 MED ORDER — ROPINIROLE HCL 1 MG PO TABS
4.0000 mg | ORAL_TABLET | Freq: Every day | ORAL | Status: DC
  Administered 2015-06-29: 4 mg via ORAL
  Filled 2015-06-29: qty 4

## 2015-06-29 MED ORDER — ONDANSETRON HCL 4 MG/2ML IJ SOLN: 4.0000 mg | Freq: Four times a day (QID) | INTRAMUSCULAR | Status: DC | PRN

## 2015-06-29 MED ORDER — LORAZEPAM 1 MG PO TABS: 1.0000 mg | ORAL_TABLET | Freq: Two times a day (BID) | ORAL | Status: DC | PRN

## 2015-06-29 MED ORDER — ONDANSETRON 4 MG PO TBDP: 4.0000 mg | ORAL_TABLET | Freq: Four times a day (QID) | ORAL | Status: DC | PRN

## 2015-06-29 MED ORDER — PROPOFOL 10 MG/ML IV BOLUS
INTRAVENOUS | Status: AC
  Filled 2015-06-29: qty 20

## 2015-06-29 MED ORDER — NITROGLYCERIN 0.4 MG SL SUBL: 0.4000 mg | SUBLINGUAL_TABLET | SUBLINGUAL | Status: DC | PRN

## 2015-06-29 MED ORDER — GLYCOPYRROLATE 0.2 MG/ML IJ SOLN
INTRAMUSCULAR | Status: AC
  Filled 2015-06-29: qty 5

## 2015-06-29 MED ORDER — BUPIVACAINE-EPINEPHRINE 0.25% -1:200000 IJ SOLN
INTRAMUSCULAR | Status: DC | PRN
  Administered 2015-06-29: 6 mL

## 2015-06-29 MED ORDER — ONDANSETRON HCL 4 MG/2ML IJ SOLN
INTRAMUSCULAR | Status: AC
  Filled 2015-06-29: qty 2

## 2015-06-29 MED ORDER — FENTANYL CITRATE (PF) 100 MCG/2ML IJ SOLN: 25.0000 ug | INTRAMUSCULAR | Status: DC | PRN

## 2015-06-29 MED ORDER — DOCUSATE SODIUM 100 MG PO CAPS
100.0000 mg | ORAL_CAPSULE | Freq: Two times a day (BID) | ORAL | Status: DC
  Administered 2015-06-29 – 2015-06-30 (×2): 100 mg via ORAL
  Filled 2015-06-29 (×2): qty 1

## 2015-06-29 MED ORDER — PHENYLEPHRINE HCL 10 MG/ML IJ SOLN
10.0000 mg | INTRAVENOUS | Status: DC | PRN
  Administered 2015-06-29: 20 ug/min via INTRAVENOUS

## 2015-06-29 MED ORDER — BUPROPION HCL ER (XL) 150 MG PO TB24
150.0000 mg | ORAL_TABLET | Freq: Every morning | ORAL | Status: DC
  Administered 2015-06-30: 150 mg via ORAL
  Filled 2015-06-29: qty 1

## 2015-06-29 MED ORDER — FERROUS GLUCONATE 324 (38 FE) MG PO TABS
324.0000 mg | ORAL_TABLET | Freq: Every day | ORAL | Status: DC
  Administered 2015-06-29 – 2015-06-30 (×2): 324 mg via ORAL
  Filled 2015-06-29 (×2): qty 1

## 2015-06-29 MED ORDER — AMLODIPINE BESYLATE 5 MG PO TABS
5.0000 mg | ORAL_TABLET | Freq: Every day | ORAL | Status: DC
  Administered 2015-06-30: 5 mg via ORAL
  Filled 2015-06-29: qty 1

## 2015-06-29 MED ORDER — PHENYLEPHRINE HCL 10 MG/ML IJ SOLN
INTRAMUSCULAR | Status: DC | PRN
  Administered 2015-06-29: 80 ug via INTRAVENOUS
  Administered 2015-06-29: 40 ug via INTRAVENOUS

## 2015-06-29 MED ORDER — OXYCODONE HCL 5 MG/5ML PO SOLN: 5.0000 mg | Freq: Once | ORAL | Status: DC | PRN

## 2015-06-29 MED ORDER — SODIUM CHLORIDE 0.9 % IR SOLN
Status: DC | PRN
  Administered 2015-06-29: 1000 mL

## 2015-06-29 MED ORDER — LACTATED RINGERS IV SOLN
INTRAVENOUS | Status: DC
  Administered 2015-06-29 (×3): via INTRAVENOUS

## 2015-06-29 MED ORDER — EPHEDRINE SULFATE 50 MG/ML IJ SOLN
INTRAMUSCULAR | Status: AC
  Filled 2015-06-29: qty 1

## 2015-06-29 MED ORDER — MORPHINE SULFATE (PF) 2 MG/ML IV SOLN
2.0000 mg | INTRAVENOUS | Status: DC | PRN
  Administered 2015-06-29 (×2): 2 mg via INTRAVENOUS
  Administered 2015-06-29: 4 mg via INTRAVENOUS
  Filled 2015-06-29: qty 2
  Filled 2015-06-29 (×2): qty 1

## 2015-06-29 MED ORDER — LIDOCAINE HCL (CARDIAC) 20 MG/ML IV SOLN
INTRAVENOUS | Status: DC | PRN
  Administered 2015-06-29: 60 mg via INTRAVENOUS

## 2015-06-29 MED ORDER — PROPOFOL 10 MG/ML IV BOLUS
INTRAVENOUS | Status: DC | PRN
  Administered 2015-06-29: 150 mg via INTRAVENOUS

## 2015-06-29 MED ORDER — 0.9 % SODIUM CHLORIDE (POUR BTL) OPTIME
TOPICAL | Status: DC | PRN
  Administered 2015-06-29: 1000 mL

## 2015-06-29 MED ORDER — METOPROLOL SUCCINATE ER 25 MG PO TB24
25.0000 mg | ORAL_TABLET | Freq: Every day | ORAL | Status: DC
  Administered 2015-06-30: 25 mg via ORAL
  Filled 2015-06-29: qty 1

## 2015-06-29 MED ORDER — LIDOCAINE HCL (CARDIAC) 20 MG/ML IV SOLN
INTRAVENOUS | Status: AC
  Filled 2015-06-29: qty 5

## 2015-06-29 MED ORDER — OXYCODONE-ACETAMINOPHEN 5-325 MG PO TABS: 1.0000 | ORAL_TABLET | ORAL | Status: DC | PRN

## 2015-06-29 MED ORDER — SUCCINYLCHOLINE CHLORIDE 20 MG/ML IJ SOLN
INTRAMUSCULAR | Status: AC
  Filled 2015-06-29: qty 1

## 2015-06-29 MED ORDER — PHENYLEPHRINE 40 MCG/ML (10ML) SYRINGE FOR IV PUSH (FOR BLOOD PRESSURE SUPPORT)
PREFILLED_SYRINGE | INTRAVENOUS | Status: AC
  Filled 2015-06-29: qty 10

## 2015-06-29 MED ORDER — ONDANSETRON HCL 4 MG/2ML IJ SOLN
INTRAMUSCULAR | Status: DC | PRN
  Administered 2015-06-29: 4 mg via INTRAVENOUS

## 2015-06-29 MED ORDER — PREGABALIN 75 MG PO CAPS
200.0000 mg | ORAL_CAPSULE | Freq: Every day | ORAL | Status: DC
  Administered 2015-06-29: 200 mg via ORAL
  Filled 2015-06-29: qty 2

## 2015-06-29 MED ORDER — HEMOSTATIC AGENTS (NO CHARGE) OPTIME
TOPICAL | Status: DC | PRN
  Administered 2015-06-29 (×2): 1 via TOPICAL

## 2015-06-29 MED ORDER — MIRTAZAPINE 15 MG PO TABS
15.0000 mg | ORAL_TABLET | Freq: Every day | ORAL | Status: DC
  Administered 2015-06-29: 15 mg via ORAL
  Filled 2015-06-29 (×2): qty 1

## 2015-06-29 MED ORDER — ACETAMINOPHEN 325 MG PO TABS: 650.0000 mg | ORAL_TABLET | Freq: Four times a day (QID) | ORAL | Status: DC | PRN

## 2015-06-29 MED ORDER — TAMSULOSIN HCL 0.4 MG PO CAPS
0.4000 mg | ORAL_CAPSULE | Freq: Every day | ORAL | Status: DC
  Administered 2015-06-30: 0.4 mg via ORAL
  Filled 2015-06-29: qty 1

## 2015-06-29 MED ORDER — OXYCODONE HCL 5 MG PO TABS: 5.0000 mg | ORAL_TABLET | Freq: Once | ORAL | Status: DC | PRN

## 2015-06-29 SURGICAL SUPPLY — 52 items
APL SKNCLS STERI-STRIP NONHPOA (GAUZE/BANDAGES/DRESSINGS) ×1
APPLIER CLIP ROT 10 11.4 M/L (STAPLE) ×3
APR CLP MED LRG 11.4X10 (STAPLE) ×1
BAG SPEC RTRVL LRG 6X4 10 (ENDOMECHANICALS) ×1
BENZOIN TINCTURE PRP APPL 2/3 (GAUZE/BANDAGES/DRESSINGS) ×3 IMPLANT
BLADE SURG ROTATE 9660 (MISCELLANEOUS) IMPLANT
CANISTER SUCTION 2500CC (MISCELLANEOUS) ×3 IMPLANT
CHLORAPREP W/TINT 26ML (MISCELLANEOUS) ×3 IMPLANT
CLIP APPLIE ROT 10 11.4 M/L (STAPLE) ×1 IMPLANT
CLOSURE WOUND 1/2 X4 (GAUZE/BANDAGES/DRESSINGS) ×1
COVER MAYO STAND STRL (DRAPES) ×3 IMPLANT
COVER SURGICAL LIGHT HANDLE (MISCELLANEOUS) ×3 IMPLANT
DRAPE C-ARM 42X72 X-RAY (DRAPES) ×3 IMPLANT
DRSG TEGADERM 2-3/8X2-3/4 SM (GAUZE/BANDAGES/DRESSINGS) ×5 IMPLANT
DRSG TEGADERM 4X4.75 (GAUZE/BANDAGES/DRESSINGS) ×1 IMPLANT
ELECT REM PT RETURN 9FT ADLT (ELECTROSURGICAL) ×3
ELECTRODE REM PT RTRN 9FT ADLT (ELECTROSURGICAL) ×1 IMPLANT
FILTER SMOKE EVAC LAPAROSHD (FILTER) ×3 IMPLANT
GAUZE SPONGE 2X2 8PLY STRL LF (GAUZE/BANDAGES/DRESSINGS) ×1 IMPLANT
GLOVE BIO SURGEON STRL SZ7 (GLOVE) ×3 IMPLANT
GLOVE BIOGEL PI IND STRL 7.0 (GLOVE) IMPLANT
GLOVE BIOGEL PI IND STRL 7.5 (GLOVE) ×1 IMPLANT
GLOVE BIOGEL PI IND STRL 8 (GLOVE) IMPLANT
GLOVE BIOGEL PI INDICATOR 7.0 (GLOVE) ×2
GLOVE BIOGEL PI INDICATOR 7.5 (GLOVE) ×4
GLOVE BIOGEL PI INDICATOR 8 (GLOVE) ×2
GLOVE ECLIPSE 7.5 STRL STRAW (GLOVE) ×2 IMPLANT
GLOVE SURG SS PI 7.0 STRL IVOR (GLOVE) ×4 IMPLANT
GOWN STRL REUS W/ TWL LRG LVL3 (GOWN DISPOSABLE) ×3 IMPLANT
GOWN STRL REUS W/TWL LRG LVL3 (GOWN DISPOSABLE) ×15
HEMOSTAT SNOW SURGICEL 2X4 (HEMOSTASIS) ×4 IMPLANT
KIT BASIN OR (CUSTOM PROCEDURE TRAY) ×3 IMPLANT
KIT ROOM TURNOVER OR (KITS) ×3 IMPLANT
NS IRRIG 1000ML POUR BTL (IV SOLUTION) ×3 IMPLANT
PAD ARMBOARD 7.5X6 YLW CONV (MISCELLANEOUS) ×3 IMPLANT
PENCIL BUTTON HOLSTER BLD 10FT (ELECTRODE) ×2 IMPLANT
POUCH SPECIMEN RETRIEVAL 10MM (ENDOMECHANICALS) ×3 IMPLANT
SCISSORS LAP 5X35 DISP (ENDOMECHANICALS) ×3 IMPLANT
SET CHOLANGIOGRAPH 5 50 .035 (SET/KITS/TRAYS/PACK) ×3 IMPLANT
SET IRRIG TUBING LAPAROSCOPIC (IRRIGATION / IRRIGATOR) ×3 IMPLANT
SLEEVE ENDOPATH XCEL 5M (ENDOMECHANICALS) ×5 IMPLANT
SPECIMEN JAR SMALL (MISCELLANEOUS) ×3 IMPLANT
SPONGE GAUZE 2X2 STER 10/PKG (GAUZE/BANDAGES/DRESSINGS) ×2
STRIP CLOSURE SKIN 1/2X4 (GAUZE/BANDAGES/DRESSINGS) ×2 IMPLANT
SUT MNCRL AB 4-0 PS2 18 (SUTURE) ×3 IMPLANT
TOWEL OR 17X24 6PK STRL BLUE (TOWEL DISPOSABLE) ×3 IMPLANT
TOWEL OR 17X26 10 PK STRL BLUE (TOWEL DISPOSABLE) ×3 IMPLANT
TRAY LAPAROSCOPIC MC (CUSTOM PROCEDURE TRAY) ×3 IMPLANT
TROCAR XCEL BLUNT TIP 100MML (ENDOMECHANICALS) ×3 IMPLANT
TROCAR XCEL NON-BLD 11X100MML (ENDOMECHANICALS) ×3 IMPLANT
TROCAR XCEL NON-BLD 5MMX100MML (ENDOMECHANICALS) ×3 IMPLANT
TUBING INSUFFLATION (TUBING) ×3 IMPLANT

## 2015-06-29 NOTE — Anesthesia Procedure Notes (Signed)
Procedure Name: Intubation Date/Time: 06/29/2015 10:36 AM Performed by: Little Ishikawa L Pre-anesthesia Checklist: Patient identified, Timeout performed, Emergency Drugs available, Suction available and Patient being monitored Patient Re-evaluated:Patient Re-evaluated prior to inductionOxygen Delivery Method: Circle system utilized Preoxygenation: Pre-oxygenation with 100% oxygen Intubation Type: IV induction Ventilation: Mask ventilation without difficulty and Oral airway inserted - appropriate to patient size Laryngoscope Size: Mac and 4 Grade View: Grade I Tube type: Oral Tube size: 7.5 mm Number of attempts: 1 Airway Equipment and Method: Stylet Placement Confirmation: ETT inserted through vocal cords under direct vision,  breath sounds checked- equal and bilateral and positive ETCO2 Secured at: 23 cm Tube secured with: Tape Dental Injury: Teeth and Oropharynx as per pre-operative assessment

## 2015-06-29 NOTE — Progress Notes (Signed)
Report to M. Dominica Severin, RN (pacu)

## 2015-06-29 NOTE — H&P (View-Only) (Signed)
History of Present Illness [name]. Mason Thedford MD; 06/04/2015 12:27 PM) The patient is a 81 year old male who presents to discuss consultation. Referred by Dr. Viann Fish for evaluation of gallbladder disease. He was seen as a consult in the hospital.  This is an 81 yo white male with a h/o severe AS who underwent a TAVR about 1 month ago. He has done well with this. He is on plavix and ASA (the plavix has been in place since some stents were placed in 2011). He is on O2 at night per his chart.   The patient went to eat a sausage biscuit recently. He then developed epigastric abdominal pain. He call EMS given his heart history. He states really by the time they were bringing him to the hospital his pain had almost dissipated. He denied any nausea or vomiting yesterday, but states over the last 2 months or so, he has intermittently developed some mild post prandial epigastric discomfort and occasional nausea, but these would all go away on their own and rather quickly. Upon arrival, he was admitted to rule out a cardiac cause for his symptoms. This was done and then an abdominal US was ordered that revealed sludge, but no stones or evidence for acute cholecystitis. his WBC is normal and his LFTs were mildly elevated, but are trending down for the most part. We have been asked to see the patient today for evaluation of cholecystectomy.   Most recent LFT's showed normal t bili, but elevated AST, ALT.  CLINICAL DATA: Epigastric abdominal pain for 1 day.  EXAM: ULTRASOUND ABDOMEN COMPLETE  COMPARISON: CT angiogram of the chest, abdomen and pelvis from 03/31/2015.  FINDINGS: Gallbladder: Nondistended gallbladder is filled with sludge, with no shadowing gallstones demonstrated. No gallbladder wall thickening or pericholecystic fluid. No sonographic Murphy sign .  Common bile duct: Diameter: 5 mm  Liver: Liver parenchyma is diffusely markedly echogenic with posterior acoustic  attenuation, in keeping with severe diffuse hepatic steatosis. No liver mass detected, noting significantly decreased sensitivity in the setting of severe steatosis. No liver surface irregularity demonstrated.  IVC: No abnormality visualized.  Pancreas: Poorly visualized due to overlying gas and patient related factors.  Spleen: Size and appearance within normal limits.  Right Kidney: Length: 11.3 cm. Echogenicity within normal limits. No mass or hydronephrosis visualized.  Left Kidney: Length: 11.9 cm. Echogenicity within normal limits. No mass or hydronephrosis visualized.  Abdominal aorta: No aneurysm visualized.  Other findings: None.  IMPRESSION: 1. Severe diffuse hepatic steatosis. 2. Sludge filled gallbladder with no cholelithiasis and no evidence of acute cholecystitis. 3. No biliary ductal dilatation.   Electronically Signed By: Delbert Phenix M.D. On: 05/20/2015 07:52    Other Problems [name], CMA; 06/04/2015 10:19 AM) Arthritis Back Pain Cerebrovascular Accident Chest pain Diabetes Mellitus Gastroesophageal Reflux Disease Heart murmur Hemorrhoids High blood pressure Hypercholesterolemia Inguinal Hernia Other disease, cancer, significant illness Sleep Apnea Vascular Disease  Past Surgical History [name], CMA; 06/04/2015 10:19 AM) Carotid Artery Surgery Left. Cataract Surgery Bilateral. Laparoscopic Inguinal Hernia Surgery Left. Oral Surgery Shoulder Surgery Bilateral. Spinal Surgery - Lower Back Valve Replacement  Diagnostic Studies History [name], CMA; 06/04/2015 10:19 AM) Colonoscopy 5-10 years ago  Allergies [name], CMA; 06/04/2015 10:20 AM) Statins Depletion *DIETARY PRODUCTS/DIETARY MANAGEMENT PRODUCTS*  Medication History [name], CMA; 06/04/2015 10:22 AM) Allopurinol (  Tablet, Oral) Active. AmLODIPine Besylate (  Tablet, Oral) Active. BuPROPion HCl ER (XL) (  Tablet  ER 24HR, Oral) Active. Clopidogrel Bisulfate (  Tablet, Oral) Active.  Furosemide (80MG  Tablet, Oral) Active. LORazepam (1MG  Tablet, Oral) Active. Metoprolol Succinate ER (25MG  Tablet ER 24HR, Oral) Active. Mirtazapine (15MG  Tablet, Oral) Active. Nitrostat (0.4MG  Tab Sublingual, Sublingual) Active. Oxycodone-Acetaminophen (10-325MG  Tablet, Oral) Active. Pantoprazole Sodium (40MG  Tablet DR, Oral) Active. ROPINIRole HCl (4MG  Tablet, Oral) Active. Myrbetriq (50MG  Tablet ER 24HR, Oral) Active. Flomax (0.4MG  Capsule, Oral) Active. Aspirin (81MG  Tablet, Oral) Active. Medications Reconciled  Social History [name], [place]; 06/04/2015 10:19 AM) Caffeine use Carbonated beverages, Coffee. No alcohol use No drug use Tobacco use Never smoker.  Family History [name], [place]; 06/04/2015 10:19 AM) Family history unknown First Degree Relatives     Review of Systems [name] CMA; 06/04/2015 10:19 AM) General Not Present- Appetite Loss, Chills, Fatigue, Fever, Night Sweats, Weight Gain and Weight Loss. Skin Present- New Lesions, Non-Healing Wounds and Ulcer. Not Present- Change in Wart/Mole, Dryness, Hives, Jaundice and Rash. HEENT Present- Wears glasses/contact lenses. Not Present- Earache, Hearing Loss, Hoarseness, Nose Bleed, Oral Ulcers, Ringing in the Ears, Seasonal Allergies, Sinus Pain, Sore Throat, Visual Disturbances and Yellow Eyes. Respiratory Present- Snoring. Not Present- Bloody sputum, Chronic Cough, Difficulty Breathing and Wheezing. Cardiovascular Present- Shortness of Breath. Not Present- Chest Pain, Difficulty Breathing Lying Down, Leg Cramps, Palpitations, Rapid Heart Rate and Swelling of Extremities. Gastrointestinal Not Present- Abdominal Pain, Bloating, Bloody Stool, Change in Bowel Habits, Chronic diarrhea, Constipation, Difficulty Swallowing, Excessive gas, Gets full quickly at meals, Hemorrhoids, Indigestion, Nausea, Rectal Pain and  Vomiting. Male Genitourinary Present- Frequency. Not Present- Blood in Urine, Change in Urinary Stream, Impotence, Nocturia, Painful Urination, Urgency and Urine Leakage. Musculoskeletal Present- Back Pain and Joint Pain. Not Present- Joint Stiffness, Muscle Pain, Muscle Weakness and Swelling of Extremities. Neurological Present- Tingling, Trouble walking and Weakness. Not Present- Decreased Memory, Fainting, Headaches, Numbness, Seizures and Tremor. Psychiatric Not Present- Anxiety, Bipolar, Change in Sleep Pattern, Depression, Fearful and Frequent crying. Hematology Present- Easy Bruising. Not Present- Excessive bleeding, Gland problems, HIV and Persistent Infections.  Vitals [name] CMA; 06/04/2015 10:22 AM) 06/04/2015 10:22 AM Weight: 237.6 lb Height: 70in Body Surface Area: 2.25 m Body Mass Index: 34.09 kg/m  Temp.: 97.34F(Temporal)  Pulse: 92 (Regular)  BP: 140/76 (Sitting, Left Arm, Standard)      Physical Exam Molli Hazard K. Toryn Mcclinton MD; 06/04/2015 12:27 PM)  The physical exam findings are as follows: Note:General: pleasant, obese white male who is laying in bed in NAD HEENT: head is normocephalic, atraumatic. Sclera are noninjected. PERRL. Ears and nose without any masses or lesions. Mouth is pink and moist Heart: regular, rate, and rhythm. Normal s1,s2. No obvious gallops, or rubs noted. + murmur Palpable radial and pedal pulses bilaterally Lungs: CTAB, no wheezes, rhonchi, or rales noted. Respiratory effort nonlabored Abd: soft, minimal epigastric discomfort, ND, but obese, +BS, no masses, hernias, or organomegaly. Rectus diastasis present MS: all 4 extremities are symmetrical with no cyanosis, clubbing, or edema. Skin: warm and dry with no masses, lesions, or rashes Psych: A&Ox3 with an appropriate affect.    Assessment & Plan [name]. Vail Vuncannon MD; 06/04/2015 10:49 AM)  CHRONIC CHOLECYSTITIS WITH CALCULUS (K80.10)  Current Plans Schedule for  Surgery - Laparoscopic cholecystectomy with intraoperative cholangiogram. The surgical procedure has been discussed with the patient. Potential risks, benefits, alternative treatments, and expected outcomes have been explained. All of the patient's questions at this time have been answered. The likelihood of reaching the patient's treatment goal is good. The patient understand the proposed surgical procedure and wishes to proceed.   Cardiac clearance from Dr. Donnie Aho needed  [name]. Corliss Skains, MD, [facility]  General/ Trauma Surgery  06/04/2015 12:28 PM

## 2015-06-29 NOTE — Op Note (Signed)
Laparoscopic Cholecystectomy Procedure Note  Indications: This patient presents with symptomatic gallbladder disease and will undergo laparoscopic cholecystectomy.  Pre-operative Diagnosis: Calculus of gallbladder with other cholecystitis, without mention of obstruction  Post-operative Diagnosis: Same  Surgeon: Talibah Colasurdo K.   Assistants: Dr. Feliciana Rossetti  Anesthesia: General endotracheal anesthesia  ASA Class: 3  Procedure Details  The patient was seen again in the Holding Room. The risks, benefits, complications, treatment options, and expected outcomes were discussed with the patient. The possibilities of reaction to medication, pulmonary aspiration, perforation of viscus, bleeding, recurrent infection, finding a normal gallbladder, the need for additional procedures, failure to diagnose a condition, the possible need to convert to an open procedure, and creating a complication requiring transfusion or operation were discussed with the patient. The likelihood of improving the patient's symptoms with return to their baseline status is good.  The patient and/or family concurred with the proposed plan, giving informed consent. The site of surgery properly noted. The patient was taken to Operating Room, identified as [name] and the procedure verified as Laparoscopic Cholecystectomy with Intraoperative Cholangiogram. A Time Out was held and the above information confirmed.  Prior to the induction of general anesthesia, antibiotic prophylaxis was administered. General endotracheal anesthesia was then administered and tolerated well. After the induction, the abdomen was prepped with Chloraprep and draped in sterile fashion. The patient was positioned in the supine position.  Local anesthetic agent was injected into the skin above the umbilicus and an incision made. We dissected down to the abdominal fascia with blunt dissection.  The fascia was incised vertically and we entered the  peritoneal cavity bluntly.  A pursestring suture of 0-Vicryl was placed around the fascial opening.  The Hasson cannula was inserted and secured with the stay suture.  Pneumoperitoneum was then created with CO2 and tolerated well without any adverse changes in the patient's vital signs. An 11-mm port was placed in the subxiphoid position.  Two 5-mm ports were placed in the right upper quadrant. All skin incisions were infiltrated with a local anesthetic agent before making the incision and placing the trocars.   We positioned the patient in reverse Trendelenburg, tilted slightly to the patient's left.  There are significant omental adhesions to the edge of the liver.  Visualization was quite difficult, so we switched to a 30 degree scope.  An additional 5 mm port was placed in the midline and the liver retractor was used to retract the small bowel/ omentum posteriorly.The gallbladder was identified, the fundus grasped and retracted cephalad.  The gallbladder was thickened and contracted. Adhesions were lysed bluntly and with the electrocautery where indicated, taking care not to injure any adjacent organs or viscus. The infundibulum was grasped and retracted laterally, exposing the peritoneum overlying the triangle of Calot.  There is significant scarring in this area.  With considerable difficulty, we bluntly dissected around the cystic duct, which was ligated with clips and divided.  The artery was identified and was dissected, ligated with clips and divided.  There is significant oozing from the surrounding tissue.The gallbladder was dissected from the liver bed in retrograde fashion with the electrocautery. The gallbladder was removed and placed in an Endocatch sac. The liver bed was irrigated and inspected. Hemostasis was achieved with the electrocautery and surgicel SNOW. Copious irrigation was utilized and was repeatedly aspirated until clear.  The gallbladder and Endocatch sac were then removed through the  umbilical port site.  The pursestring suture was used to close the umbilical fascia.  We again inspected the right upper quadrant for hemostasis.  Pneumoperitoneum was released as we removed the trocars.  4-0 Monocryl was used to close the skin.   Benzoin, steri-strips, and clean dressings were applied. The patient was then extubated and brought to the recovery room in stable condition. Instrument, sponge, and needle counts were correct at closure and at the conclusion of the case.   Findings: Cholecystitis with Cholelithiasis  Estimated Blood Loss: 200 mL         Drains: none         Specimens: Gallbladder           Complications: None; patient tolerated the procedure well.         Disposition: PACU - hemodynamically stable.         Condition: stable  [name], MD, [facility]  General/ Trauma Surgery  06/29/2015 12:13 PM

## 2015-06-29 NOTE — Interval H&P Note (Signed)
History and Physical Interval Note:  06/29/2015 7:52 AM  [name]  has presented today for surgery, with the diagnosis of CHRONIC CHOLECYSTITIS  The various methods of treatment have been discussed with the patient and family. After consideration of risks, benefits and other options for treatment, the patient has consented to  Procedure(s): LAPAROSCOPIC CHOLECYSTECTOMY WITH INTRAOPERATIVE CHOLANGIOGRAM (N/A) as a surgical intervention .  The patient's history has been reviewed, patient examined, no change in status, stable for surgery.  I have reviewed the patient's chart and labs.  Questions were answered to the patient's satisfaction.     Ludwig Tugwell K.

## 2015-06-29 NOTE — Anesthesia Preprocedure Evaluation (Signed)
Anesthesia Evaluation  Patient identified by MRN, date of birth, ID band Patient awake    Reviewed: Allergy & Precautions, NPO status , Patient's Chart, lab work & pertinent test results  Airway Mallampati: II   Neck ROM: full    Dental   Pulmonary shortness of breath, sleep apnea ,    breath sounds clear to auscultation       Cardiovascular hypertension, + CAD, + Past MI, + Peripheral Vascular Disease and +CHF  + Valvular Problems/Murmurs  Rhythm:regular Rate:Normal  S/p TAVR for AS.  EF 55% on recent TTE.   Neuro/Psych Anxiety Depression TIACVA    GI/Hepatic GERD  ,  Endo/Other  diabetes, Type 2obese  Renal/GU Renal InsufficiencyRenal disease     Musculoskeletal  (+) Arthritis ,   Abdominal   Peds  Hematology   Anesthesia Other Findings   Reproductive/Obstetrics                             Anesthesia Physical Anesthesia Plan  ASA: IV  Anesthesia Plan: General   Post-op Pain Management:    Induction: Intravenous  Airway Management Planned: Oral ETT  Additional Equipment:   Intra-op Plan:   Post-operative Plan: Extubation in OR and Possible Post-op intubation/ventilation  Informed Consent: I have reviewed the patients History and Physical, chart, labs and discussed the procedure including the risks, benefits and alternatives for the proposed anesthesia with the patient or authorized representative who has indicated his/her understanding and acceptance.     Plan Discussed with: CRNA, Anesthesiologist and Surgeon  Anesthesia Plan Comments:         Anesthesia Quick Evaluation

## 2015-06-29 NOTE — Transfer of Care (Signed)
Immediate Anesthesia Transfer of Care Note  Patient: [name]  Procedure(s) Performed: Procedure(s): LAPAROSCOPIC CHOLECYSTECTOMY (N/A)  Patient Location: PACU  Anesthesia Type:General  Level of Consciousness: awake  Airway & Oxygen Therapy: Patient Spontanous Breathing and Patient connected to nasal cannula oxygen  Post-op Assessment: Report given to RN, Post -op Vital signs reviewed and stable and Patient moving all extremities X 4  Post vital signs: stable  Last Vitals:  Filed Vitals:   06/29/15 0917  BP: 133/53  Pulse: 70  Temp: 36.3 C    Complications: No apparent anesthesia complications

## 2015-06-30 ENCOUNTER — Encounter (HOSPITAL_COMMUNITY): Payer: Self-pay | Admitting: Surgery

## 2015-06-30 ENCOUNTER — Inpatient Hospital Stay (HOSPITAL_COMMUNITY)
Admission: EM | Admit: 2015-06-30 | Discharge: 2015-07-25 | DRG: 296 | Disposition: E | Payer: Medicare Other | Attending: Internal Medicine | Admitting: Internal Medicine

## 2015-06-30 ENCOUNTER — Emergency Department (HOSPITAL_COMMUNITY): Payer: Medicare Other

## 2015-06-30 ENCOUNTER — Other Ambulatory Visit: Payer: Self-pay | Admitting: Internal Medicine

## 2015-06-30 DIAGNOSIS — Z9842 Cataract extraction status, left eye: Secondary | ICD-10-CM | POA: Diagnosis not present

## 2015-06-30 DIAGNOSIS — R570 Cardiogenic shock: Secondary | ICD-10-CM | POA: Diagnosis present

## 2015-06-30 DIAGNOSIS — E875 Hyperkalemia: Secondary | ICD-10-CM | POA: Diagnosis present

## 2015-06-30 DIAGNOSIS — E1122 Type 2 diabetes mellitus with diabetic chronic kidney disease: Secondary | ICD-10-CM | POA: Diagnosis present

## 2015-06-30 DIAGNOSIS — Z9049 Acquired absence of other specified parts of digestive tract: Secondary | ICD-10-CM

## 2015-06-30 DIAGNOSIS — K219 Gastro-esophageal reflux disease without esophagitis: Secondary | ICD-10-CM | POA: Diagnosis present

## 2015-06-30 DIAGNOSIS — J9600 Acute respiratory failure, unspecified whether with hypoxia or hypercapnia: Secondary | ICD-10-CM

## 2015-06-30 DIAGNOSIS — Z9841 Cataract extraction status, right eye: Secondary | ICD-10-CM | POA: Diagnosis not present

## 2015-06-30 DIAGNOSIS — G931 Anoxic brain damage, not elsewhere classified: Secondary | ICD-10-CM | POA: Diagnosis present

## 2015-06-30 DIAGNOSIS — Z9981 Dependence on supplemental oxygen: Secondary | ICD-10-CM | POA: Diagnosis not present

## 2015-06-30 DIAGNOSIS — Z955 Presence of coronary angioplasty implant and graft: Secondary | ICD-10-CM | POA: Diagnosis not present

## 2015-06-30 DIAGNOSIS — I2510 Atherosclerotic heart disease of native coronary artery without angina pectoris: Secondary | ICD-10-CM | POA: Diagnosis present

## 2015-06-30 DIAGNOSIS — I350 Nonrheumatic aortic (valve) stenosis: Secondary | ICD-10-CM | POA: Diagnosis not present

## 2015-06-30 DIAGNOSIS — I469 Cardiac arrest, cause unspecified: Principal | ICD-10-CM | POA: Diagnosis present

## 2015-06-30 DIAGNOSIS — I447 Left bundle-branch block, unspecified: Secondary | ICD-10-CM | POA: Diagnosis present

## 2015-06-30 DIAGNOSIS — K660 Peritoneal adhesions (postprocedural) (postinfection): Secondary | ICD-10-CM | POA: Diagnosis present

## 2015-06-30 DIAGNOSIS — Z8673 Personal history of transient ischemic attack (TIA), and cerebral infarction without residual deficits: Secondary | ICD-10-CM | POA: Diagnosis not present

## 2015-06-30 DIAGNOSIS — Z961 Presence of intraocular lens: Secondary | ICD-10-CM | POA: Diagnosis present

## 2015-06-30 DIAGNOSIS — Z515 Encounter for palliative care: Secondary | ICD-10-CM | POA: Diagnosis present

## 2015-06-30 DIAGNOSIS — J9620 Acute and chronic respiratory failure, unspecified whether with hypoxia or hypercapnia: Secondary | ICD-10-CM | POA: Diagnosis not present

## 2015-06-30 DIAGNOSIS — M1990 Unspecified osteoarthritis, unspecified site: Secondary | ICD-10-CM | POA: Diagnosis present

## 2015-06-30 DIAGNOSIS — E872 Acidosis: Secondary | ICD-10-CM | POA: Diagnosis present

## 2015-06-30 DIAGNOSIS — N183 Chronic kidney disease, stage 3 (moderate): Secondary | ICD-10-CM | POA: Diagnosis present

## 2015-06-30 DIAGNOSIS — G9340 Encephalopathy, unspecified: Secondary | ICD-10-CM | POA: Insufficient documentation

## 2015-06-30 DIAGNOSIS — I252 Old myocardial infarction: Secondary | ICD-10-CM | POA: Diagnosis not present

## 2015-06-30 DIAGNOSIS — I5032 Chronic diastolic (congestive) heart failure: Secondary | ICD-10-CM | POA: Diagnosis present

## 2015-06-30 DIAGNOSIS — I130 Hypertensive heart and chronic kidney disease with heart failure and stage 1 through stage 4 chronic kidney disease, or unspecified chronic kidney disease: Secondary | ICD-10-CM | POA: Diagnosis present

## 2015-06-30 DIAGNOSIS — N289 Disorder of kidney and ureter, unspecified: Secondary | ICD-10-CM

## 2015-06-30 DIAGNOSIS — E662 Morbid (severe) obesity with alveolar hypoventilation: Secondary | ICD-10-CM | POA: Diagnosis present

## 2015-06-30 DIAGNOSIS — R4020 Unspecified coma: Secondary | ICD-10-CM | POA: Diagnosis present

## 2015-06-30 DIAGNOSIS — F418 Other specified anxiety disorders: Secondary | ICD-10-CM | POA: Diagnosis present

## 2015-06-30 DIAGNOSIS — D649 Anemia, unspecified: Secondary | ICD-10-CM | POA: Diagnosis present

## 2015-06-30 DIAGNOSIS — Z952 Presence of prosthetic heart valve: Secondary | ICD-10-CM

## 2015-06-30 DIAGNOSIS — Z7902 Long term (current) use of antithrombotics/antiplatelets: Secondary | ICD-10-CM | POA: Diagnosis not present

## 2015-06-30 DIAGNOSIS — D696 Thrombocytopenia, unspecified: Secondary | ICD-10-CM | POA: Diagnosis present

## 2015-06-30 DIAGNOSIS — I4901 Ventricular fibrillation: Secondary | ICD-10-CM | POA: Diagnosis present

## 2015-06-30 DIAGNOSIS — G2581 Restless legs syndrome: Secondary | ICD-10-CM | POA: Diagnosis present

## 2015-06-30 DIAGNOSIS — N179 Acute kidney failure, unspecified: Secondary | ICD-10-CM | POA: Diagnosis present

## 2015-06-30 DIAGNOSIS — G9382 Brain death: Secondary | ICD-10-CM | POA: Diagnosis present

## 2015-06-30 DIAGNOSIS — Z66 Do not resuscitate: Secondary | ICD-10-CM | POA: Diagnosis present

## 2015-06-30 LAB — I-STAT CHEM 8, ED
BUN: 50 mg/dL — AB (ref 6–20)
CALCIUM ION: 1.04 mmol/L — AB (ref 1.13–1.30)
Chloride: 104 mmol/L (ref 101–111)
Creatinine, Ser: 3.6 mg/dL — ABNORMAL HIGH (ref 0.61–1.24)
Glucose, Bld: 302 mg/dL — ABNORMAL HIGH (ref 65–99)
HEMATOCRIT: 30 % — AB (ref 39.0–52.0)
HEMOGLOBIN: 10.2 g/dL — AB (ref 13.0–17.0)
Potassium: 5 mmol/L (ref 3.5–5.1)
SODIUM: 136 mmol/L (ref 135–145)
TCO2: 15 mmol/L (ref 0–100)

## 2015-06-30 LAB — URINALYSIS, ROUTINE W REFLEX MICROSCOPIC
Glucose, UA: NEGATIVE mg/dL
Ketones, ur: 15 mg/dL — AB
NITRITE: POSITIVE — AB
PROTEIN: 100 mg/dL — AB
Specific Gravity, Urine: 1.019 (ref 1.005–1.030)
pH: 5.5 (ref 5.0–8.0)

## 2015-06-30 LAB — CBC
HCT: 33.4 % — ABNORMAL LOW (ref 39.0–52.0)
HEMATOCRIT: 30.4 % — AB (ref 39.0–52.0)
HEMOGLOBIN: 9.1 g/dL — AB (ref 13.0–17.0)
Hemoglobin: 10.3 g/dL — ABNORMAL LOW (ref 13.0–17.0)
MCH: 29.3 pg (ref 26.0–34.0)
MCH: 29.5 pg (ref 26.0–34.0)
MCHC: 30.8 g/dL (ref 30.0–36.0)
MCHC: 29.9 g/dL — ABNORMAL LOW (ref 30.0–36.0)
MCV: 95.2 fL (ref 78.0–100.0)
MCV: 98.7 fL (ref 78.0–100.0)
PLATELETS: 118 10*3/uL — AB (ref 150–400)
Platelets: 97 10*3/uL — ABNORMAL LOW (ref 150–400)
RBC: 3.51 MIL/uL — AB (ref 4.22–5.81)
RBC: 3.08 MIL/uL — AB (ref 4.22–5.81)
RDW: 14.9 % (ref 11.5–15.5)
RDW: 14.9 % (ref 11.5–15.5)
WBC: 7.8 10*3/uL (ref 4.0–10.5)
WBC: 15.2 10*3/uL — AB (ref 4.0–10.5)

## 2015-06-30 LAB — HEPATIC FUNCTION PANEL
ALT: 57 U/L (ref 17–63)
AST: 186 U/L — AB (ref 15–41)
Albumin: 2.8 g/dL — ABNORMAL LOW (ref 3.5–5.0)
Alkaline Phosphatase: 110 U/L (ref 38–126)
BILIRUBIN DIRECT: 0.2 mg/dL (ref 0.1–0.5)
BILIRUBIN TOTAL: 0.6 mg/dL (ref 0.3–1.2)
Indirect Bilirubin: 0.4 mg/dL (ref 0.3–0.9)
Total Protein: 5.9 g/dL — ABNORMAL LOW (ref 6.5–8.1)

## 2015-06-30 LAB — URINE MICROSCOPIC-ADD ON: Squamous Epithelial / LPF: NONE SEEN

## 2015-06-30 LAB — BASIC METABOLIC PANEL
ANION GAP: 8 (ref 5–15)
ANION GAP: 19 — AB (ref 5–15)
BUN: 35 mg/dL — ABNORMAL HIGH (ref 6–20)
BUN: 41 mg/dL — AB (ref 6–20)
CHLORIDE: 105 mmol/L (ref 101–111)
CHLORIDE: 103 mmol/L (ref 101–111)
CO2: 25 mmol/L (ref 22–32)
CO2: 15 mmol/L — ABNORMAL LOW (ref 22–32)
Calcium: 8.4 mg/dL — ABNORMAL LOW (ref 8.9–10.3)
Calcium: 8.2 mg/dL — ABNORMAL LOW (ref 8.9–10.3)
Creatinine, Ser: 2.88 mg/dL — ABNORMAL HIGH (ref 0.61–1.24)
Creatinine, Ser: 4.13 mg/dL — ABNORMAL HIGH (ref 0.61–1.24)
GFR calc non Af Amer: 19 mL/min — ABNORMAL LOW (ref >60)
GFR, EST AFRICAN AMERICAN: 22 mL/min — AB (ref >60)
GFR, EST AFRICAN AMERICAN: 15 mL/min — AB (ref >60)
GFR, EST NON AFRICAN AMERICAN: 13 mL/min — AB (ref >60)
Glucose, Bld: 146 mg/dL — ABNORMAL HIGH (ref 65–99)
Glucose, Bld: 320 mg/dL — ABNORMAL HIGH (ref 65–99)
POTASSIUM: 5 mmol/L (ref 3.5–5.1)
Potassium: 5.4 mmol/L — ABNORMAL HIGH (ref 3.5–5.1)
SODIUM: 138 mmol/L (ref 135–145)
SODIUM: 137 mmol/L (ref 135–145)

## 2015-06-30 LAB — GLUCOSE, CAPILLARY: GLUCOSE-CAPILLARY: 179 mg/dL — AB (ref 65–99)

## 2015-06-30 LAB — MRSA PCR SCREENING: MRSA by PCR: NEGATIVE

## 2015-06-30 LAB — PROTIME-INR
INR: 1.25 (ref 0.00–1.49)
Prothrombin Time: 15.8 seconds — ABNORMAL HIGH (ref 11.6–15.2)

## 2015-06-30 LAB — LACTIC ACID, PLASMA
LACTIC ACID, VENOUS: 5.9 mmol/L — AB (ref 0.5–2.0)
LACTIC ACID, VENOUS: 4.1 mmol/L — AB (ref 0.5–2.0)
Lactic Acid, Venous: 7.4 mmol/L (ref 0.5–2.0)

## 2015-06-30 LAB — I-STAT ARTERIAL BLOOD GAS, ED
Acid-base deficit: 15 mmol/L — ABNORMAL HIGH (ref 0.0–2.0)
Bicarbonate: 11.2 mEq/L — ABNORMAL LOW (ref 20.0–24.0)
O2 SAT: 100 %
PCO2 ART: 28.6 mmHg — AB (ref 35.0–45.0)
TCO2: 12 mmol/L (ref 0–100)
pH, Arterial: 7.202 — ABNORMAL LOW (ref 7.350–7.450)
pO2, Arterial: 201 mmHg — ABNORMAL HIGH (ref 80.0–100.0)

## 2015-06-30 LAB — TROPONIN I: TROPONIN I: 1.5 ng/mL — AB (ref <0.031)

## 2015-06-30 LAB — I-STAT CG4 LACTIC ACID, ED: LACTIC ACID, VENOUS: 11.85 mmol/L — AB (ref 0.5–2.0)

## 2015-06-30 LAB — I-STAT TROPONIN, ED: Troponin i, poc: 0.11 ng/mL (ref 0.00–0.08)

## 2015-06-30 LAB — PROCALCITONIN: PROCALCITONIN: 3.69 ng/mL

## 2015-06-30 LAB — LIPASE, BLOOD: LIPASE: 57 U/L — AB (ref 11–51)

## 2015-06-30 MED ORDER — SODIUM CHLORIDE 0.9 % IV SOLN: 250.0000 mL | INTRAVENOUS | Status: DC | PRN

## 2015-06-30 MED ORDER — CALCIUM CHLORIDE 10 % IV SOLN
INTRAVENOUS | Status: AC | PRN
  Administered 2015-06-30: 1 g via INTRAVENOUS

## 2015-06-30 MED ORDER — CLOPIDOGREL BISULFATE 75 MG PO TABS
75.0000 mg | ORAL_TABLET | Freq: Every day | ORAL | Status: DC
  Administered 2015-06-30 – 2015-07-01 (×2): 75 mg
  Filled 2015-06-30 (×2): qty 1

## 2015-06-30 MED ORDER — ANTISEPTIC ORAL RINSE SOLUTION (CORINZ)
7.0000 mL | Freq: Four times a day (QID) | OROMUCOSAL | Status: DC
  Administered 2015-07-01 (×3): 7 mL via OROMUCOSAL

## 2015-06-30 MED ORDER — INSULIN ASPART 100 UNIT/ML ~~LOC~~ SOLN
0.0000 [IU] | SUBCUTANEOUS | Status: DC
Start: 2015-06-30 — End: 2015-07-01
  Administered 2015-06-30: 3 [IU] via SUBCUTANEOUS
  Administered 2015-06-30: 2 [IU] via SUBCUTANEOUS
  Administered 2015-07-01 (×2): 3 [IU] via SUBCUTANEOUS
  Administered 2015-07-01: 2 [IU] via SUBCUTANEOUS

## 2015-06-30 MED ORDER — SUCCINYLCHOLINE CHLORIDE 20 MG/ML IJ SOLN
INTRAMUSCULAR | Status: DC | PRN
  Administered 2015-06-30: 120 mg via INTRAVENOUS

## 2015-06-30 MED ORDER — EPINEPHRINE HCL 0.1 MG/ML IJ SOSY
PREFILLED_SYRINGE | INTRAMUSCULAR | Status: DC | PRN
  Administered 2015-06-30 (×4): 1 mg via INTRAVENOUS

## 2015-06-30 MED ORDER — PANTOPRAZOLE SODIUM 40 MG IV SOLR
40.0000 mg | Freq: Every day | INTRAVENOUS | Status: DC
  Administered 2015-06-30: 40 mg via INTRAVENOUS
  Filled 2015-06-30: qty 40

## 2015-06-30 MED ORDER — SODIUM CHLORIDE 0.9 % IV SOLN: 10.0000 mL/h | Freq: Once | INTRAVENOUS | Status: DC

## 2015-06-30 MED ORDER — SODIUM CHLORIDE 0.9 % IV SOLN
INTRAVENOUS | Status: DC | PRN
  Administered 2015-06-30: 1000 mL via INTRAVENOUS

## 2015-06-30 MED ORDER — MIDAZOLAM HCL 2 MG/2ML IJ SOLN: 1.0000 mg | INTRAMUSCULAR | Status: DC | PRN

## 2015-06-30 MED ORDER — FENTANYL CITRATE (PF) 100 MCG/2ML IJ SOLN: 50.0000 ug | INTRAMUSCULAR | Status: DC | PRN

## 2015-06-30 MED ORDER — ATROPINE SULFATE 1 MG/ML IJ SOLN
INTRAMUSCULAR | Status: DC | PRN
  Administered 2015-06-30: 1 mg via INTRAVENOUS

## 2015-06-30 MED ORDER — OXYCODONE-ACETAMINOPHEN 5-325 MG PO TABS: 1.0000 | ORAL_TABLET | ORAL | Status: AC | PRN

## 2015-06-30 MED ORDER — HEPARIN SODIUM (PORCINE) 5000 UNIT/ML IJ SOLN
5000.0000 [IU] | Freq: Three times a day (TID) | INTRAMUSCULAR | Status: DC
  Administered 2015-06-30 (×2): 5000 [IU] via SUBCUTANEOUS
  Filled 2015-06-30 (×2): qty 1

## 2015-06-30 MED ORDER — CHLORHEXIDINE GLUCONATE 0.12% ORAL RINSE (MEDLINE KIT)
15.0000 mL | Freq: Two times a day (BID) | OROMUCOSAL | Status: DC
  Administered 2015-06-30 – 2015-07-01 (×2): 15 mL via OROMUCOSAL

## 2015-06-30 MED ORDER — ASPIRIN 81 MG PO CHEW
81.0000 mg | CHEWABLE_TABLET | Freq: Every day | ORAL | Status: DC
  Administered 2015-06-30 – 2015-07-01 (×2): 81 mg
  Filled 2015-06-30 (×2): qty 1

## 2015-06-30 MED ORDER — SODIUM CHLORIDE 0.9 % IV SOLN
INTRAVENOUS | Status: DC
  Administered 2015-06-30 – 2015-07-01 (×2): via INTRAVENOUS

## 2015-06-30 MED ORDER — DEXTROSE 5 % IV SOLN
2.0000 ug/min | INTRAVENOUS | Status: DC
  Filled 2015-06-30: qty 4

## 2015-06-30 MED ORDER — NOREPINEPHRINE BITARTRATE 1 MG/ML IV SOLN
0.0000 ug/min | Freq: Once | INTRAVENOUS | Status: AC
  Administered 2015-06-30: 5 ug/min via INTRAVENOUS
  Filled 2015-06-30: qty 4

## 2015-06-30 NOTE — ED Provider Notes (Signed)
.  Central Line Date/Time: 07/07/2015 4:35 PM Performed by: Maris Berger Authorized by: Maris Berger Consent: The procedure was performed in an emergent situation. Indications: vascular access Anesthesia: local infiltration Local anesthetic: lidocaine 1% without epinephrine Patient sedated: no Preparation: skin prepped with ChloraPrep Skin prep agent dried: skin prep agent completely dried prior to procedure Sterile barriers: all five maximum sterile barriers used - cap, mask, sterile gown, sterile gloves, and large sterile sheet Hand hygiene: hand hygiene performed prior to central venous catheter insertion Location details: left subclavian Patient position: flat Catheter type: triple lumen Pre-procedure: landmarks identified Ultrasound guidance: yes Sterile ultrasound techniques: sterile gel and sterile probe covers were used Number of attempts: 2 Successful placement: yes Post-procedure: line sutured and dressing applied Assessment: blood return through all ports,  free fluid flow,  placement verified by x-ray and no pneumothorax on x-ray Patient tolerance: Patient tolerated the procedure well with no immediate complications   Supervised by Dr. Donnald Garre.  Maris Berger, MD July 14, 2015 1610  Arby Barrette, MD 07/02/15 [phone]

## 2015-06-30 NOTE — Anesthesia Postprocedure Evaluation (Signed)
Anesthesia Post Note  Patient: [name]  Procedure(s) Performed: Procedure(s) (LRB): LAPAROSCOPIC CHOLECYSTECTOMY (N/A)  Patient location during evaluation: PACU Anesthesia Type: General Level of consciousness: awake and alert and patient cooperative Pain management: pain level controlled Vital Signs Assessment: post-procedure vital signs reviewed and stable Respiratory status: spontaneous breathing and respiratory function stable Cardiovascular status: stable Anesthetic complications: no    Last Vitals:  Filed Vitals:   06/29/15 2128 22-Jul-2015 0514  BP: 93/54 118/45  Pulse: 100 107  Temp: 36.6 C 36.6 C  Resp: 18 20    Last Pain:  Filed Vitals:   July 22, 2015 0804  PainSc: 2                  Shauntea Lok S

## 2015-06-30 NOTE — Progress Notes (Signed)
Reviewed dc instructions w/ pt, pt's son-in-law, and dtr; Pt verbalized understanding; RX given; A/O, in no apparent distress; encouraged pt to f/u with wound care clinic to resume weekly visits for foot wound; Pt agreed; discharged via w/c by volunteer services Marvia Pickles, RN

## 2015-06-30 NOTE — Discharge Instructions (Signed)
[facility] °LAPAROSCOPIC SURGERY: POST OP INSTRUCTIONS °Always review your discharge instruction sheet given to you by the facility where your surgery was performed. °IF YOU HAVE DISABILITY OR FAMILY LEAVE FORMS, YOU MUST BRING THEM TO THE OFFICE FOR PROCESSING.   °DO NOT GIVE THEM TO YOUR DOCTOR. ° °1. A prescription for pain medication will be given to you upon discharge.  Take your pain medication as prescribed, if needed.  If narcotic pain medicine is not needed, then you may take acetaminophen (Tylenol) or ibuprofen (Advil) as needed. °2. Take your usually prescribed medications unless otherwise directed. °3. If you need a refill on your pain medication, please contact your pharmacy.  They will contact our office to request authorization. Prescriptions will not be filled after 5pm or on week-ends. °4. You should follow a light diet the first few days after arrival home, such as soup and crackers, etc.  Be sure to include lots of fluids daily. °5. Most patients will experience some swelling and bruising in the area of the incisions.  Ice packs will help.  Swelling and bruising can take several days to resolve.  °6. It is common to experience some constipation if taking pain medication after surgery.  Increasing fluid intake and taking a stool softener (such as Colace) will usually help or prevent this problem from occurring.  A mild laxative (Milk of Magnesia or Miralax) should be taken according to package instructions if there are no bowel movements after 48 hours. °7. Unless discharge instructions indicate otherwise, you may remove your bandages 48 hours after surgery, and you may shower at that time.  You will have steri-strips (small skin tapes) in place directly over the incision.  These strips should be left on the skin for 7-10 days.  If your surgeon used skin glue on the incision, you may shower in 24 hours.  The glue will flake off over the next 2-3 weeks.  Any sutures or staples  will be removed at the office during your follow-up visit. °8. ACTIVITIES:  You may resume regular (light) daily activities beginning the next day--such as daily self-care, walking, climbing stairs--gradually increasing activities as tolerated.  You may have sexual intercourse when it is comfortable.  Refrain from any heavy lifting or straining until approved by your doctor. °a. You may drive when you are no longer taking prescription pain medication, you can comfortably wear a seatbelt, and you can safely maneuver your car and apply brakes. °b. RETURN TO WORK:   2-3 weeks °9. You should see your doctor in the office for a follow-up appointment approximately 2-3 weeks after your surgery.  Make sure that you call for this appointment within a day or two after you arrive home to insure a convenient appointment time. °10. OTHER INSTRUCTIONS: ________________________________________________________________________ °WHEN TO CALL YOUR DOCTOR: °1. Fever over 101.0 °2. Inability to urinate °3. Continued bleeding from incision. °4. Increased pain, redness, or drainage from the incision. °5. Increasing abdominal pain ° °The clinic staff is available to answer your questions during regular business hours.  Please don’t hesitate to call and ask to speak to one of the nurses for clinical concerns.  If you have a medical emergency, go to the nearest emergency room or call 911.  A surgeon from [facility] is always on call at the hospital. °[address], [address], [place], [place]  [address] ? [address] [address], [place], [place]   [address] °[phone] ? [phone] ? FAX [phone] °Web site:   [email or web address] ° °

## 2015-06-30 NOTE — Progress Notes (Signed)
1 Day Post-Op  Subjective: Patient complains of minimal soreness Wants to go home No nausea or vomiting Sitting up in the chair  Objective: Vital signs in last 24 hours: Temp:  [97.3 F (36.3 C)-98 F (36.7 C)] 97.8 F (36.6 C) (12/07 0514) Pulse Rate:  [70-107] 107 (12/07 0514) Resp:  [15-20] 20 (12/07 0514) BP: (93-133)/(41-57) 118/45 mmHg (12/07 0514) SpO2:  [90 %-97 %] 93 % (12/07 0514) Weight:  [108.41 kg (239 lb)] 108.41 kg (239 lb) (12/06 0917) Last BM Date: 06/26/15  Intake/Output from previous day: 12/06 0701 - 12/07 0700 In: 2680 [P.O.:720; I.V.:1910; IV Piggyback:50] Out: 50 [Blood:50] Intake/Output this shift:    General appearance: alert, cooperative and no distress GI: obese, soft; mild incisional tenderness Dressings c/d/i  Lab Results:   Recent Labs  06/28/2015 0530  WBC 7.8  HGB 10.3*  HCT 33.4*  PLT 118*   BMET  Recent Labs  07/20/2015 0530  NA 138  K 5.4*  CL 105  CO2 25  GLUCOSE 146*  BUN 35*  CREATININE 2.88*  CALCIUM 8.4*   PT/INR  Recent Labs  06/29/15 1005  LABPROT 14.2  INR 1.08   ABG No results for input(s): PHART, HCO3 in the last 72 hours.  Invalid input(s): PCO2, PO2  Studies/Results: No results found.  Anti-infectives: Anti-infectives    Start     Dose/Rate Route Frequency Ordered Stop   06/29/15 1800  ceFAZolin (ANCEF) IVPB 2 g/50 mL premix     2 g 100 mL/hr over 30 Minutes Intravenous 3 times per day 06/29/15 1345 06/29/15 2156   06/29/15 0600  ceFAZolin (ANCEF) IVPB 2 g/50 mL premix     2 g 100 mL/hr over 30 Minutes Intravenous To ShortStay Surgical 06/28/15 1448 06/29/15 1038      Assessment/Plan: s/p Procedure(s): LAPAROSCOPIC CHOLECYSTECTOMY (N/A) Discharge Decrease in Hgb, as would be expected based on findings at surgery  Vitals signs stable Chronic renal insufficiency - bump in creatinine today.  This will need to be rechecked soon.  I discussed with the patient the possibility of staying on  more night for hydration and to recheck labs in AM.  He feels well and really wants to go home.  We will ask his PCP to check his renal function either tomorrow or Monday.       Amaura Authier K. 06/26/2015

## 2015-06-30 NOTE — Consult Note (Signed)
Cardiology Consult Note  Admit date: 07/21/2015 Name: [name] 81 y.o.  male DOB:  03/03/1933 MRN:  [id number]  Today's date:  07/23/2015  Referring Physician:    [facility]  Primary Physician:    Dr. Bayard Hugger  Reason for Consultation:    Following cardiac arrest  IMPRESSIONS: 1.  Out of hospital cardiac arrest-events suggest respiratory arrest as opposed to V. fib arrest clinically 2.  Recent laparoscopic cholecystectomy 3.  Acute renal failure 4.  Chronic respiratory failure with obesity hypoventilation syndrome 5.  Recent TAVR on September 13 for severe aortic stenosis 6.  Coronary artery disease with previous stenting with patent stents previously 7.  Obesity 8.  Carotid artery disease with previous TIA and carotid endarterectomy 9.  Hypertensive heart disease  10.  Diabetes mellitus with vascular disease  RECOMMENDATION: Clinical history was reviewed with the patient's family in detail as well as with the general surgeon and critical care physician.  Patient has had significant issues this year with chronic respiratory failure with prolonged intubation.  He has also had worsening renal failure this year.  He was felt to have severe aortic stenosis and diastolic heart failure percent dictating a lot of these admissions and had TAVR done in September and had been doing well.  The clinical event suggests respiratory failure as a cause.  I think it's unlikely he'll survive this episode but he is currently BP and given supportive care.  After discussion with the family they would like to continue care at the present time to see if he will have any improvement.  One would wonder about the recent TAVR and would suggest getting an echo to assess aortic valve function.  At this point I don't think there is anything else to be done cardiac-wise other than support with pressors as is being done by critical care medicine.    HISTORY: This 81 year old male has a very  complicated history with previous staph pericarditis, chronic respiratory failure, coronary artery disease and hypertensive heart disease as well as ulcerations on his lower legs and cellulitis.  He has had multiple admissions this year with respiratory failure and is thought to have significant sleep apnea.  He is also diabetic and has had significant ulcerations of his legs.  After multiple admissions this year he was found to have severe aortic stenosis that had progressed and underwent TAVR on September 13.  He tolerated this fairly well and eventually was able to return home and had been improving and his ability to do things with no recurrent admissions.  He was admitted in October with biliary colic and had elevated liver enzymes.  At that time because of his diabetes it was felt that the risk of recurrent issues with significant and he was significantly symptomatic that elective cholecystectomy was offered.  After careful review of the patient he was felt to of had good recovery following his TAVR.  Dr. Excell Seltzer and I discussed the case and felt that there was no benefit to delaying cholecystectomy and that it would be okay to discontinue Plavix prior to an operation since it was almost 3 months since his valve surgery.  He stopped Plavix on December 1 and underwent laparoscopic cholecystectomy yesterday.  He evidently was feeling fairly well this morning but had an increase in his creatinine following surgery.  According to the notes in conversation the patient was insistent on going home and arrangements are made for him to go home by the surgeon with careful follow-up of  his renal function.  According to the family he was taken home and they were assisting him into the house after walking up a few steps he became severely short of breath and then slumped to the floor.  He was conscious and was assisted to a chair but was having significant respiratory distress.  He then fell out of the chair onto the floor  and was felt to be in cardiac arrest.  The relative who is a retired Company secretary immediately began CPR and he received probably of an hour of CPR.  The was no interruption in CPR from the time but was started.  He received multiple amps of epinephrine in route and was pulseless on arrival to the emergency room and received additional epinephrine in the emergency room and eventually had return of spontaneous circulation again.  He was intubated and I was asked to see him in the emergency room.  Currently a central catheter is being placed.  He did not have any chest pain.  EKG was nonischemic.  Past Medical History  Diagnosis Date  . Obesity (BMI 30-39.9)   . Restless leg syndrome   . Hypertensive heart disease   . CAD (coronary artery disease), native coronary artery     a. CAD s/p PTCA of LCx and OM bifurcation 2010. b. Residual disease by cath 02/2015 - med rx for now, reserving PCI for recurrent sx.  Marland Kitchen History of pericarditis 2007    a. h/o MSSA, s/p pericardial window.  Marland Kitchen GERD   . TIA (transient ischemic attack) 2005  . BPH (benign prostatic hypertrophy)     "minor"  . Lumbar spinal stenosis   . Unspecified venous (peripheral) insufficiency   . Stroke [facility]) May 2003  . Anxiety   . Depression   . Aortic stenosis     a. s/p TAVR 03/2015.  . Carotid artery disease (HCC)     Prior TIA 2006 with left CEA by Dr. Arbie Cookey Recurrent TIA in April 2011   . Lumbar disc disease   . CKD (chronic kidney disease), stage III 02/08/2014  . Hypertension   . 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 Chronic diastolic CHF (congestive heart failure) (HCC)   . LBBB (left bundle branch block)     a. Intermittent LBBB on EKGs.  . Statin intolerance   . Heart murmur   . Myocardial infarction [facility]) ~ 2005; 2011    "light"  . Pneumonia 12/2014  . OSA on CPAP   . On home oxygen therapy     "I take  2L at night" (06/29/2015)  . Type 2 diabetes mellitus with vascular disease (HCC)   . Osteoarthritis     "qwhere"  . Chronic lower back pain   . History of gout     "took IV treatment for 1 yr~ 2010; not suppose to have trouble w/gout for 15 years; haven't had problems since treatment" (05/19/2015)  . Acute respiratory failure with hypoxia (HCC) 05/2014; 12/2014    "vented ~ 5days; Vented ~ 14days"  . Shortness of breath dyspnea       Past Surgical History  Procedure Laterality Date  . Pericardial window  2007  . Shoulder arthroscopy Left 2008  . Shoulder surgery Right 1970's    "took out part of the joint and the bursa"  . Cataract extraction w/ intraocular lens  implant, bilateral Bilateral  Left side x's 2 and right  . Retinal detachment surgery Left   . Carotid endarterectomy Left 12-06-05  . Coronary angioplasty with stent placement  01/2009    x 1 stent  . Colonoscopy    . Carpal tunnel release Bilateral   . Lumbar spine surgery  05/2012    "@ [place], had arthritis real bad; took out lots of bone spurs"  . Inguinal hernia repair Left 10/01/2013    Procedure: HERNIA REPAIR INGUINAL ADULT;  Surgeon: Axel Filler, MD;  Location: [facility] ORS;  Service: General;  Laterality: Left;  . Insertion of mesh Left 10/01/2013    Procedure: INSERTION OF MESH;  Surgeon: Axel Filler, MD;  Location: [facility] ORS;  Service: General;  Laterality: Left;  . Finger amputation Right 11/2013    JUST TO FIRST JOINT RIGHT HAND  LAST FINGER ( Right 5th finger)  . Peripheral vascular catheterization N/A 01/08/2015    Procedure: Abdominal Aortogram;  Surgeon: Chuck Hint, MD;  Location: [facility] INVASIVE CV LAB;  Service: Cardiovascular;  Laterality: N/A;  . Cardiac catheterization N/A 03/15/2015    Procedure: Right/Left Heart Cath and Coronary Angiography;  Surgeon: Tonny Bollman, MD;  Location: [facility] INVASIVE CV LAB;  Service: Cardiovascular;  Laterality: N/A;  . Transcatheter aortic valve replacement,  transfemoral N/A 04/06/2015    Procedure: TRANSCATHETER AORTIC VALVE REPLACEMENT, TRANSFEMORAL;  Surgeon: Tonny Bollman, MD;  Location: [facility] OR;  Service: Open Heart Surgery;  Laterality: N/A;  . Tee without cardioversion N/A 04/06/2015    Procedure: TRANSESOPHAGEAL ECHOCARDIOGRAM (TEE);  Surgeon: Tonny Bollman, MD;  Location: [facility] OR;  Service: Open Heart Surgery;  Laterality: N/A;  . Cataract extraction w/ intraocular lens implant      "botched it the first time"  . Eye surgery    . Inguinal hernia repair    . Back surgery    . Cardiac valve replacement    . Laparoscopic cholecystectomy  06/29/2015  . Cholecystectomy N/A 06/29/2015    Procedure: LAPAROSCOPIC CHOLECYSTECTOMY;  Surgeon: Manus Rudd, MD;  Location: [facility] OR;  Service: General;  Laterality: N/A;    Allergies:  is allergic to statins.   Medications: Prior to Admission medications   Medication Sig Start Date End Date Taking? Authorizing Provider  acetaminophen (TYLENOL) 325 MG tablet Take 650 mg by mouth every 6 (six) hours as needed for mild pain or moderate pain.   Yes Historical Provider, MD  allopurinol (ZYLOPRIM) 300 MG tablet Take 1 tablet (300 mg total) by mouth every morning. 05/24/15  Yes Newt Lukes, MD  amLODipine (NORVASC) 5 MG tablet Take 5 mg by mouth daily.  03/08/15  Yes Historical Provider, MD  aspirin EC 81 MG tablet Take 81 mg by mouth daily.   Yes Historical Provider, MD  buPROPion (WELLBUTRIN XL) 150 MG 24 hr tablet TAKE ONE TABLET BY MOUTH EVERY MORNING 05/04/15  Yes Newt Lukes, MD  cholecalciferol (VITAMIN D) 1000 UNITS tablet Take 1,000 Units by mouth daily.   Yes Historical Provider, MD  clopidogrel (PLAVIX) 75 MG tablet Take 1 tablet (75 mg total) by mouth daily. 11/11/14  Yes Newt Lukes, MD  docusate sodium 100 MG CAPS Take 100 mg by mouth 2 (two) times daily. 07/16/14  Yes Ripudeep Jenna Luo, MD  Ferrous Gluconate 256 (28 FE) MG TABS Take 256 mg by mouth daily.   Yes Historical Provider,  MD  furosemide (LASIX) 40 MG tablet Take 1 tablet (40 mg total) by mouth 2 (two) times daily. Patient taking  differently: Take 80 mg by mouth daily.  04/09/15  Yes Azalee Course, PA  LORazepam (ATIVAN) 1 MG tablet Take 1 tablet (1 mg total) by mouth 2 (two) times daily as needed for anxiety. 05/27/15  Yes Myrlene Broker, MD  Magnesium 250 MG TABS Take 250 mg by mouth daily.    Yes Historical Provider, MD  metoprolol succinate (TOPROL-XL) 25 MG 24 hr tablet Take 1 tablet (25 mg total) by mouth daily. Take with or immediately following a meal. 04/09/15  Yes Azalee Course, PA  mirabegron ER (MYRBETRIQ) 50 MG TB24 tablet Take 1 tablet (50 mg total) by mouth daily. 05/28/15  Yes Corwin Levins, MD  mirtazapine (REMERON) 15 MG tablet Take 1 tablet (15 mg total) by mouth at bedtime. 09/10/14  Yes Newt Lukes, MD  nitroGLYCERIN (NITROSTAT) 0.4 MG SL tablet Place 0.4 mg under the tongue every 5 (five) minutes as needed for chest pain (3 doses MAX).   Yes Historical Provider, MD  oxyCODONE-acetaminophen (PERCOCET/ROXICET) 5-325 MG tablet Take 1-2 tablets by mouth every 4 (four) hours as needed for moderate pain. 07/15/2015  Yes Manus Rudd, MD  pantoprazole (PROTONIX) 40 MG tablet Take 1 tablet (40 mg total) by mouth 2 (two) times daily. 06/11/15  Yes Corwin Levins, MD  pregabalin (LYRICA) 200 MG capsule Take 1 capsule (200 mg total) by mouth at bedtime. 12/15/14  Yes Newt Lukes, MD  rOPINIRole (REQUIP) 4 MG tablet Take 1 tablet (4 mg total) by mouth at bedtime. 06/02/15  Yes Newt Lukes, MD  tamsulosin (FLOMAX) 0.4 MG CAPS capsule Take 1 capsule (0.4 mg total) by mouth daily. 11/11/14  Yes Newt Lukes, MD  vitamin B-12 (CYANOCOBALAMIN) 1000 MCG tablet Take 1,000 mcg by mouth daily.   Yes Historical Provider, MD  oxyCODONE-acetaminophen (PERCOCET) 10-325 MG tablet Take 1 tablet by mouth daily as needed for pain. Patient not taking: Reported on 06/26/2015 05/28/15   Corwin Levins, MD   Family  History: Family Status  Relation Status Death Age  . Mother Deceased 25    Heart disease  . Father Deceased 59    Heart disease  . Son Alive   . Maternal Grandmother Deceased   . Maternal Grandfather Deceased   . Paternal Grandmother Deceased   . Paternal Grandfather Deceased    Social History:   reports that he has never smoked. He has never used smokeless tobacco. He reports that he does not drink alcohol or use illicit drugs.   Social History   Social History Narrative   Lives alone    Review of Systems: Not obtainable  Physical Exam: BP 135/60 mmHg  Pulse 76  Temp(Src) 94.8 F (34.9 C)  Resp 16  Ht 5\' 11"  (1.803 m)  SpO2 94%  General appearance: Obese elderly male who is currently intubated and unresponsive on stretcher Head: Normocephalic, without obvious abnormality, atraumatic Lungs: Bilateral breath sounds are noted Heart: Mildly irregular rhythm, normal S1 and S2, 2/6 systolic murmur over aortic valve Extremities: Changes of venous stasis noted, previous ulcers noted on lower legs, 1+ edema, peripheral pulses diminished Pulses: Peripheral pulses diminished Neurologic: Intubated paralyzed and sedated.  Unable to test neurologic reflexes  Labs: CBC  Recent Labs  07/08/2015 1442 06/29/2015 1451  WBC 15.2*  --   RBC 3.08*  --   HGB 9.1* 10.2*  HCT 30.4* 30.0*  PLT 97*  --   MCV 98.7  --   MCH 29.5  --   MCHC  29.9*  --   RDW 14.9  --    CMP   Recent Labs  Jul 26, 2015 1442 Jul 26, 2015 1451  NA 137 136  K 5.0 5.0  CL 103 104  CO2 15*  --   GLUCOSE 320* 302*  BUN 41* 50*  CREATININE 4.13* 3.60*  CALCIUM 8.2*  --   GFRNONAA 13*  --   GFRAA 15*  --    BNP (last 3 results)    Component Value Date/Time   BNP 67.6 05/19/2015 1431   Cardiac Panel (last 3 results) Troponin (Point of Care Test)  Recent Labs  07/26/2015 1449  TROPIPOC 0.11*   Radiology: Low lung volumes, endotracheal tube in place, I G line in place, perihilar  density.  EKG: Rhythm difficult but looks like atrial fibrillation to me.  Right bundle branch block, left axis deviation, nonspecific ST changes.    Signed:  Darden Palmer MD [facility]   Cardiology Consultant  July 26, 2015, 5:55 PM

## 2015-06-30 NOTE — ED Notes (Addendum)
Per EMS, pt was d/c today post cholecystectomy after arriving home today pt went unresponsive with family. Compressions started by family prior to EMS arrival. Upon EMS arrival pt was in asystole EMS started ACLS on pt for 30 minutes Pt was shocked once out of v-fib. Pulses returned  for 10 minutes followed by pt going back into asystole. EMS began CPR for an additional 10 minutes getting ROSC. Pt arrived to the ED, upon checking pulses after moving pt over he was pulseless. CPR resumed. Total of 12 Epis and 2mg  onarcan given in route. [place] in place. Pt received 2L Normal Saline by EMS

## 2015-06-30 NOTE — H&P (Signed)
PULMONARY / CRITICAL CARE MEDICINE   Name: [name] MRN: [id number] DOB: 06-24-1933    ADMISSION DATE:  2015-07-29 CONSULTATION DATE:  07/29/2015  REFERRING MD:  EDP  CHIEF COMPLAINT:  Cardiac Arrest  HISTORY OF PRESENT ILLNESS:  Pt is encephelopathic; therefore, this HPI is obtained from chart review. [name] is a 81 y.o. M with extensive PMH as outlined below.  He was admitted to [facility] 12/6 for elective lap chole.  Surgery was uneventful and pt did well post - operatively. He was discharged home afternoon of 12/7 and unfortunately after arriving home (just after walking into the house and walking upstairs), he collapsed in front of family.  Family immediately began CPR and EMS was dispatched. Upon EMS arrival, pt was asystole.  ACLS was initiated and performed for roughly 30 minutes prior to ROSC.  During that time frame, pt went into V.fib once for which he was defibrillated x 1.  He maintained rhythm for roughly 10 minutes prior to going back into asystole.  ACLS performed again for roughly 10 minutes prior to ROSC.  In ED, upon moving pt onto ED stretcher, he was found to be pulseless again.  ACLS was performed off and on for roughly 12 minutes (pt would apparently regain pulses after each dose of epi was administered).  Total downtime was roughly 55 - 60 minutes.  Per [facility], on morning of 12/7, pt was asymptomatic and was eager to return home.  Pt's family reports that on the way home and upon arrival home, he did not have any complaints and stated that he felt well.\  Of note, he had TAVR in September and follow up echo 1 month out had an increased gradient.   PAST MEDICAL HISTORY :  He  has a past medical history of Obesity (BMI 30-39.9); Restless leg syndrome; Hypertensive heart disease; CAD (coronary artery disease), native coronary artery; History of pericarditis (2007); GERD; TIA (transient ischemic attack) (2005); BPH (benign prostatic hypertrophy); Lumbar spinal  stenosis; Unspecified venous (peripheral) insufficiency; Stroke 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  PAST SURGICAL HISTORY: He  has past surgical history that includes Pericardial window (2007); Shoulder arthroscopy (Left, 2008); Shoulder surgery (Right, 1970's); Cataract extraction w/ intraocular lens  implant, bilateral (Bilateral); Retinal detachment surgery (Left); Carotid endarterectomy (Left, 12-06-05); Coronary angioplasty with stent (01/2009); Colonoscopy; Carpal tunnel release (Bilateral); Lumbar spine surgery (05/2012); Inguinal hernia repair (Left, 10/01/2013); Insertion of mesh (Left, 10/01/2013); Finger amputation (Right, 11/2013); Cardiac catheterization (N/A, 01/08/2015); Cardiac catheterization (N/A, 03/15/2015); Transcatheter aortic valve replacement, transfemoral (N/A, 04/06/2015); TEE without cardioversion (N/A, 04/06/2015); Cataract extraction w/ intraocular lens implant; Eye surgery; Inguinal hernia repair; Back surgery; Cardiac valve replacement; Laparoscopic cholecystectomy (06/29/2015); and Cholecystectomy (N/A, 06/29/2015).  Allergies  Allergen Reactions  . Statins Other (See Comments)    Severe leg myalgias, weakness    No current facility-administered medications on file prior to encounter.   Current Outpatient Prescriptions on File Prior to Encounter  Medication  Sig  . acetaminophen (TYLENOL) 325 MG tablet Take 650  mg by mouth every 6 (six) hours as needed for mild pain or moderate pain.  Marland Kitchen allopurinol (ZYLOPRIM) 300 MG tablet Take 1 tablet (300 mg total) by mouth every morning.  Marland Kitchen amLODipine (NORVASC) 5 MG tablet Take 5 mg by mouth daily.   Marland Kitchen aspirin EC 81 MG tablet Take 81 mg by mouth daily.  Marland Kitchen buPROPion (WELLBUTRIN XL) 150 MG 24 hr tablet TAKE ONE TABLET BY MOUTH EVERY MORNING  . cholecalciferol (VITAMIN D) 1000 UNITS tablet Take 1,000 Units by mouth daily.  . clopidogrel (PLAVIX) 75 MG tablet Take 1 tablet (75 mg total) by mouth daily.  Marland Kitchen docusate sodium 100 MG CAPS Take 100 mg by mouth 2 (two) times daily.  . Ferrous Gluconate 256 (28 FE) MG TABS Take 256 mg by mouth daily.  . furosemide (LASIX) 40 MG tablet Take 1 tablet (40 mg total) by mouth 2 (two) times daily. (Patient taking differently: Take 80 mg by mouth daily. )  . LORazepam (ATIVAN) 1 MG tablet Take 1 tablet (1 mg total) by mouth 2 (two) times daily as needed for anxiety.  . Magnesium 250 MG TABS Take 250 mg by mouth daily.   . metoprolol succinate (TOPROL-XL) 25 MG 24 hr tablet Take 1 tablet (25 mg total) by mouth daily. Take with or immediately following a meal.  . mirabegron ER (MYRBETRIQ) 50 MG TB24 tablet Take 1 tablet (50 mg total) by mouth daily.  . mirtazapine (REMERON) 15 MG tablet Take 1 tablet (15 mg total) by mouth at bedtime.  . nitroGLYCERIN (NITROSTAT) 0.4 MG SL tablet Place 0.4 mg under the tongue every 5 (five) minutes as needed for chest pain (3 doses MAX).  Marland Kitchen oxyCODONE-acetaminophen (PERCOCET/ROXICET) 5-325 MG tablet Take 1-2 tablets by mouth every 4 (four) hours as needed for moderate pain.  . pantoprazole (PROTONIX) 40 MG tablet Take 1 tablet (40 mg total) by mouth 2 (two) times daily.  . pregabalin (LYRICA) 200 MG capsule Take 1 capsule (200 mg total) by mouth at bedtime.  Marland Kitchen rOPINIRole (REQUIP) 4 MG tablet Take 1 tablet (4 mg total) by mouth at bedtime.  .  tamsulosin (FLOMAX) 0.4 MG CAPS capsule Take 1 capsule (0.4 mg total) by mouth daily.  . vitamin B-12 (CYANOCOBALAMIN) 1000 MCG tablet Take 1,000 mcg by mouth daily.  Marland Kitchen oxyCODONE-acetaminophen (PERCOCET) 10-325 MG tablet Take 1 tablet by mouth daily as needed for pain. (Patient not taking: Reported on 07-29-15)    FAMILY HISTORY:  His indicated that his mother is deceased. He indicated that his father is deceased. He indicated that his maternal grandmother is deceased. He indicated that his maternal grandfather is deceased. He indicated that his paternal grandmother is deceased. He indicated that his paternal grandfather is deceased. He indicated that his son is alive.   SOCIAL HISTORY: He  reports that he has never smoked. He has never used smokeless tobacco. He reports that he does not drink alcohol or use illicit drugs.  REVIEW OF SYSTEMS:  Unable to obtain as pt is encephalopathic.  SUBJECTIVE:  On full vent support, completely unresponsive.  On Levophed.  VITAL SIGNS: BP 102/47 mmHg  Pulse 77  Temp(Src) 95.2 F (35.1 C)  Resp 30  SpO2 96%  HEMODYNAMICS:    VENTILATOR SETTINGS:    INTAKE / OUTPUT:     PHYSICAL EXAMINATION: General: Elderly male, critically ill. Neuro: Non-responsive on vent. HEENT: [place]/AT. PERRL, sclerae anicteric. ETT in place. Cardiovascular: RRR, no M/R/G.  Lungs: Respirations even and unlabored.  Coarse bilaterally. Abdomen: Abd distended.  Laparoscopic incisions C/D/I.  BS absent. Musculoskeletal: No gross deformities, no edema.  Skin: Intact, warm, no rashes.  LABS:  BMET  Recent Labs Lab 06/25/15 0858 July 04, 2015 0530 2015-07-04 1442 2015-07-04 1451  NA 140 138 137 136  K 4.6 5.4* 5.0 5.0  CL 106 105 103 104  CO2 28 25 15*  --   BUN 18 35* 41* 50*  CREATININE 1.31* 2.88* 4.13* 3.60*  GLUCOSE 127* 146* 320* 302*    Electrolytes  Recent Labs Lab 06/25/15 0858 2015-07-04 0530 2015-07-04 1442  CALCIUM 9.3 8.4* 8.2*     CBC  Recent Labs Lab 06/25/15 0858 07-04-2015 0530 July 04, 2015 1442 04-Jul-2015 1451  WBC 6.0 7.8 15.2*  --   HGB 14.3 10.3* 9.1* 10.2*  HCT 44.8 33.4* 30.4* 30.0*  PLT 116* 118* 97*  --     Coag's  Recent Labs Lab 06/29/15 1005  INR 1.08    Sepsis Markers  Recent Labs Lab 2015-07-04 1451  LATICACIDVEN 11.85*    ABG No results for input(s): PHART, PCO2ART, PO2ART in the last 168 hours.  Liver Enzymes No results for input(s): AST, ALT, ALKPHOS, BILITOT, ALBUMIN in the last 168 hours.  Cardiac Enzymes No results for input(s): TROPONINI, PROBNP in the last 168 hours.  Glucose  Recent Labs Lab 06/25/15 0811 06/29/15 0923 06/29/15 1341 06/29/15 1747  GLUCAP 132* 92 108* 129*    Imaging Dg Chest Portable 1 View  04-Jul-2015  CLINICAL DATA:  CPR x2 today, status post intubation EXAM: PORTABLE CHEST - 1 VIEW COMPARISON:  05/19/2015 FINDINGS: Cardiac shadow is prominent. Increased central vascular congestion is noted consistent with the given clinical history. An endotracheal tube is now seen in satisfactory position approximately 3.8 cm above the carina. A nasogastric catheter is noted coiled in the stomach. No focal confluent infiltrate is seen. No pneumothorax is noted. A fracture of the right third rib laterally is seen which may be related to the recent CPR. IMPRESSION: Status post intubation as described. Increased central vascular congestion likely related to the recent cardiac event. There are changes consistent with a right third rib fracture likely related to recent CPR. Electronically Signed   By: Alcide Clever M.D.   On: 07-04-15 15:28     STUDIES:  CXR 12/7 > vascular congestion  CULTURES: Blood 12/7 >  ANTIBIOTICS: None  SIGNIFICANT EVENTS: 12/6 > lap chole 12/7 > discharged home.  Upon arriving home, suffered cardiac arrest.  Had prolonged downtime with total of 55 - 60 minutes ACLS.  LINES/TUBES: ETT 12/7 > L IJ CVL 12/7 >  DISCUSSION: 81  y.o. M who underwent elective lap chole 12/6.  Discharged 12/7 and upon returning home, collapsed due to cardiac arrest.  Had prolonged downtime with ACLS ~ 55 - 60 minutes (including CPR at home as well as in ED).  Unclear etiology at this point.  Extensive discussion with family regarding [name] current circumstances, organ failures, prognosis.  They have decided against additional resuscitation if arrest were to occur again, but to otherwise continue with current medical therapies.  DNR orders placed.  ASSESSMENT / PLAN:  CARDIOVASCULAR A:  S/p cardiac arrest 12/7 with prolonged downtime of ~ 55 - 60 minutes - unclear etiology at this point. Shock - likely cardiogenic. Hx AS s/p TAVR Sept 2016. Hx CAD, HTN, dCHF, LBBB. DNR STATUS. P:  Not a hypothermia candidate given prolonged downtime. Continue supportive care for now but DNR in the event of future arrest.  Levophed as needed to maintain goal MAP > 65. Continue ASA, plavix. BB restricted due to shock. Trend troponin / lactate. Echo. Hold outpatient amlodipine, furosemide, toprol - xl, nitro.  NEUROLOGIC A:   Acute encephalopathy with concern for significant anoxic brain injury given prolonged downtime. P:   Sedation:  Fentanyl PRN / Midazolam PRN. RASS goal: 0 to -1. Daily WUA. EEG. May need neuro consult. Hold outpatient bupropion, lorazepam, mirtazapine, percocet, pregabalin, ropinirole.  PULMONARY A: VDRF due to cardiac arrest 12/7. Hx OSA on CPAP with O2 bleed in. P:   Full vent support. Wean as able. VAP prevention measures. SBT in AM if neuro status improves.  CXR in AM.  RENAL A:   AoCKD. AG metabolic acidosis - lactate. Hypocalcemia. P:   NS @ 125. BMP in AM.  GASTROINTESTINAL A:   Cholecystitis - s/p elective lap chole 06/29/15. GERD. Nutrition. P:   SUP: Pantoprazole. NPO.  HEMATOLOGIC A:   Mild anemia - s/p 1u PRBC transfusion in ED. Thrombocytopenia. VTE Prophylaxis. P:   Transfuse for Hgb < 7. Monitor platelet counts. SCD's / ASA / Plavix / Heparin SQ. CBC in AM.  INFECTIOUS A:   Leukocytosis - likely acute phase reactant; no indication of infection. P:   Check PCT for completeness; if high, then consider abx. Blood cultures sent by EDP.  ENDOCRINE A:   DM - not on outpatient med regimen. P:   SSI. Assess Hgb A1c.  Family updated: I had extensive discussions with multiple family members including sons, daughters, grandchildren.  We discussed [name] current circumstances and organ failures. We also discussed patient's prior wishes under circumstances such as this. The family has decided not to perform resuscitation if arrest were to occur, but to otherwise continue with current medical support / therapies.   Interdisciplinary Family Meeting v Palliative Care Meeting:  Due by: 12/13.   Rutherford Guys, [place] [facility] Pager: [phone]  or [phone] 07/19/2015, 4:06 PM

## 2015-06-30 NOTE — ED Provider Notes (Signed)
CSN: [id number]     Arrival date & time 07-03-2015  1431 History   First MD Initiated Contact with Patient 03-Jul-2015 1503     Chief Complaint  Patient presents with  . Cardiac Arrest     (Consider location/radiation/quality/duration/timing/severity/associated sxs/prior Treatment) HPI Patient was discharged several hours prior to return to emergency department. He had had a cholecystectomy performed. Once home he collapsed from his chair. There is limited history surrounding that event. Reportedly he had stated not feeling well and then collapsed. Family members initiated CPR. Medics initiated resuscitation. Patient was pulseless and had periods of PEA. They were able to get return of circulation with chest compressions and epinephrine. Patient was intubated with a King airway in the field. Past Medical History  Diagnosis Date  . Obesity (BMI 30-39.9)   . Restless leg syndrome   . Hypertensive heart disease   . CAD (coronary artery disease), native coronary artery     a. CAD s/p PTCA of LCx and OM bifurcation 2010. b. Residual disease by cath 02/2015 - med rx for now, reserving PCI for recurrent sx.  Marland Kitchen History of pericarditis 2007    a. h/o MSSA, s/p pericardial window.  Marland Kitchen GERD   . TIA (transient ischemic attack) 2005  . BPH (benign prostatic hypertrophy)     "minor"  . Lumbar spinal stenosis   . Unspecified venous (peripheral) insufficiency   . Stroke [facility]) May 2003  . Anxiety   . Depression   . Aortic stenosis     a. s/p TAVR 03/2015.  . Carotid artery disease (HCC)     Prior TIA 2006 with left CEA by Dr. Arbie Cookey Recurrent TIA in April 2011   . Lumbar disc disease   . CKD (chronic kidney disease), stage III 02/08/2014  . Hypertension   . 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 Chronic diastolic CHF (congestive heart failure) (HCC)   . LBBB (left bundle branch block)      a. Intermittent LBBB on EKGs.  . Statin intolerance   . Heart murmur   . Myocardial infarction [facility]) ~ 2005; 2011    "light"  . Pneumonia 12/2014  . OSA on CPAP   . On home oxygen therapy     "I take 2L at night" (06/29/2015)  . Type 2 diabetes mellitus with vascular disease (HCC)   . Osteoarthritis     "qwhere"  . Chronic lower back pain   . History of gout     "took IV treatment for 1 yr~ 2010; not suppose to have trouble w/gout for 15 years; haven't had problems since treatment" (05/19/2015)  . Acute respiratory failure with hypoxia (HCC) 05/2014; 12/2014    "vented ~ 5days; Vented ~ 14days"  . Shortness of breath dyspnea    Past Surgical History  Procedure Laterality Date  . Pericardial window  2007  . Shoulder arthroscopy Left 2008  . Shoulder surgery Right 1970's    "took out part of the joint and the bursa"  . Cataract extraction w/ intraocular lens  implant, bilateral Bilateral     Left side x's 2 and right  . Retinal detachment surgery Left   . Carotid endarterectomy Left 12-06-05  . Coronary angioplasty with stent placement  01/2009    x 1 stent  . Colonoscopy    . Carpal tunnel release Bilateral   . Lumbar spine surgery  05/2012    "@  [place], had arthritis real bad; took out lots of bone spurs"  . Inguinal hernia repair Left 10/01/2013    Procedure: HERNIA REPAIR INGUINAL ADULT;  Surgeon: Axel Filler, MD;  Location: [facility] ORS;  Service: General;  Laterality: Left;  . Insertion of mesh Left 10/01/2013    Procedure: INSERTION OF MESH;  Surgeon: Axel Filler, MD;  Location: [facility] ORS;  Service: General;  Laterality: Left;  . Finger amputation Right 11/2013    JUST TO FIRST JOINT RIGHT HAND  LAST FINGER ( Right 5th finger)  . Peripheral vascular catheterization N/A 01/08/2015    Procedure: Abdominal Aortogram;  Surgeon: Chuck Hint, MD;  Location[facility] INVAS[facility] CV LAB;  Service: Cardiovascular;  Laterality: N/A;  . Cardiac catheterization N/A 03/15/2015     Procedure: Right/Left Heart Cath and Coronary Angiography;  Surgeon: Tonny Bollman, MD;  Location: [facility] INVASIVE CV LAB;  Service: Cardiovascular;  Laterality: N/A;  . Transcatheter aortic valve replacement, transfemoral N/A 04/06/2015    Procedure: TRANSCATHETER AORTIC VALVE REPLACEMENT, TRANSFEMORAL;  Surgeon: Tonny Bollman, MD;  Location: [facility] OR;  Service: Open Heart Surgery;  Laterality: N/A;  . Tee without cardioversion N/A 04/06/2015    Procedure: TRANSESOPHAGEAL ECHOCARDIOGRAM (TEE);  Surgeon: Tonny Bollman, MD;  Location: [facility] OR;  Service: Open Heart Surgery;  Laterality: N/A;  . Cataract extraction w/ intraocular lens implant      "botched it the first time"  . Eye surgery    . Inguinal hernia repair    . Back surgery    . Cardiac valve replacement    . Laparoscopic cholecystectomy  06/29/2015  . Cholecystectomy N/A 06/29/2015    Procedure: LAPAROSCOPIC CHOLECYSTECTOMY;  Surgeon: Manus Rudd, MD;  Location: [facility] OR;  Service: General;  Laterality: N/A;   Family History  Problem Relation Age of Onset  . Heart disease Mother     Before age 42  . Heart attack Mother   . Diabetes Father   . Heart disease Father   . Breast cancer Sister   . Cancer Sister   . Prostate cancer Brother   . Lung cancer Brother   . Cancer Brother   . Diabetes Daughter   . Diabetes Son   . Hypertension Son    Social History  Substance Use Topics  . Smoking status: Never Smoker   . Smokeless tobacco: Never Used  . Alcohol Use: No    Review of Systems Unable to obtain review of systems due to patient condition.   Allergies  Statins  Home Medications   Prior to Admission medications   Medication Sig Start Date End Date Taking? Authorizing Provider  acetaminophen (TYLENOL) 325 MG tablet Take 650 mg by mouth every 6 (six) hours as needed for mild pain or moderate pain.   Yes Historical Provider, MD  allopurinol (ZYLOPRIM) 300 MG tablet Take 1 tablet (300 mg total) by mouth every morning. 05/24/15   Yes Newt Lukes, MD  amLODipine (NORVASC) 5 MG tablet Take 5 mg by mouth daily.  03/08/15  Yes Historical Provider, MD  aspirin EC 81 MG tablet Take 81 mg by mouth daily.   Yes Historical Provider, MD  buPROPion (WELLBUTRIN XL) 150 MG 24 hr tablet TAKE ONE TABLET BY MOUTH EVERY MORNING 05/04/15  Yes Newt Lukes, MD  cholecalciferol (VITAMIN D) 1000 UNITS tablet Take 1,000 Units by mouth daily.   Yes Historical Provider, MD  clopidogrel (PLAVIX) 75 MG tablet Take 1 tablet (75 mg total) by mouth daily. 11/11/14  Yes Vikki Ports  A Leschber, MD  docusate sodium 100 MG CAPS Take 100 mg by mouth 2 (two) times daily. 07/16/14  Yes Ripudeep Jenna Luo, MD  Ferrous Gluconate 256 (28 FE) MG TABS Take 256 mg by mouth daily.   Yes Historical Provider, MD  furosemide (LASIX) 40 MG tablet Take 1 tablet (40 mg total) by mouth 2 (two) times daily. Patient taking differently: Take 80 mg by mouth daily.  04/09/15  Yes Azalee Course, PA  LORazepam (ATIVAN) 1 MG tablet Take 1 tablet (1 mg total) by mouth 2 (two) times daily as needed for anxiety. 05/27/15  Yes Myrlene Broker, MD  Magnesium 250 MG TABS Take 250 mg by mouth daily.    Yes Historical Provider, MD  metoprolol succinate (TOPROL-XL) 25 MG 24 hr tablet Take 1 tablet (25 mg total) by mouth daily. Take with or immediately following a meal. 04/09/15  Yes Azalee Course, PA  mirabegron ER (MYRBETRIQ) 50 MG TB24 tablet Take 1 tablet (50 mg total) by mouth daily. 05/28/15  Yes Corwin Levins, MD  mirtazapine (REMERON) 15 MG tablet Take 1 tablet (15 mg total) by mouth at bedtime. 09/10/14  Yes Newt Lukes, MD  nitroGLYCERIN (NITROSTAT) 0.4 MG SL tablet Place 0.4 mg under the tongue every 5 (five) minutes as needed for chest pain (3 doses MAX).   Yes Historical Provider, MD  oxyCODONE-acetaminophen (PERCOCET/ROXICET) 5-325 MG tablet Take 1-2 tablets by mouth every 4 (four) hours as needed for moderate pain. July 08, 2015  Yes Manus Rudd, MD  pantoprazole (PROTONIX) 40 MG  tablet Take 1 tablet (40 mg total) by mouth 2 (two) times daily. 06/11/15  Yes Corwin Levins, MD  pregabalin (LYRICA) 200 MG capsule Take 1 capsule (200 mg total) by mouth at bedtime. 12/15/14  Yes Newt Lukes, MD  rOPINIRole (REQUIP) 4 MG tablet Take 1 tablet (4 mg total) by mouth at bedtime. 06/02/15  Yes Newt Lukes, MD  tamsulosin (FLOMAX) 0.4 MG CAPS capsule Take 1 capsule (0.4 mg total) by mouth daily. 11/11/14  Yes Newt Lukes, MD  vitamin B-12 (CYANOCOBALAMIN) 1000 MCG tablet Take 1,000 mcg by mouth daily.   Yes Historical Provider, MD  oxyCODONE-acetaminophen (PERCOCET) 10-325 MG tablet Take 1 tablet by mouth daily as needed for pain. Patient not taking: Reported on 2015-07-08 05/28/15   Corwin Levins, MD   BP 101/47 mmHg  Pulse 81  Temp(Src) 94.8 F (34.9 C)  Resp 18  Ht  (1.803 m)  SpO2 97% Physical Exam  Constitutional:  Patient is unresponsive. He has significant distended abdomen and is pale in appearance. He is being actively bagged with [facility] [facility] airway in place.  HENT:  Head: Normocephalic and atraumatic.  Cardiovascular:  Upon arrival cannot appreciate cardiac heart tones.  Pulmonary/Chest:  Course breath sounds bilaterally with bagging.  Abdominal:  Abdomen is very distended with diffuse ecchymosis fresh surgical sites are not actively bleeding.  Musculoskeletal: He exhibits edema.  Neurological:  Patient is obtunded and unresponsive.  Skin:  Skin is pale and cool.    ED Course  .Intubation Date/Time: July 08, 2015 4:55 PM Performed by: Arby Barrette Authorized by: Arby Barrette Consent: The procedure was performed in an emergent situation. Indications: respiratory failure Intubation method: video-assisted Patient status: paralyzed (RSI) Pretreatment medications: none Paralytic: succinylcholine Laryngoscope size: Mac 3 Tube type: cuffed Number of attempts: 1 Post-procedure assessment: CO2 detector and chest rise Breath sounds:  equal Cuff inflated: yes Chest x-ray interpreted by radiologist. Chest x-ray findings: endotracheal tube  in appropriate position Comments: Patient had been intubated in the field with a King airway. This was changed by myself to a formal endotracheal intubation. Patient was having some spontaneous respiratory effort although he had no level of alertness and no spontaneous movement other than occasional attempts at respiration. Succinylcholine was used to try to minimize risk of vomiting and aspiration. There was evidence of GI secretions in the [facility] airway. On slit the airway was out and I was able to visualize the airway with the video cystoscope, there were pooled secretions in the hypopharynx. These were not obscuring the glottis or pooling into the airway at the time of intubation. Intubation was without difficulty on first attempt.   (including critical care time) CRITICAL CARE Performed by: Arby Barrette   Total critical care time: 60 minutes  Critical care time was exclusive of separately billable procedures and treating other patients.  Critical care was necessary to treat or prevent imminent or life-threatening deterioration.  Critical care was time spent personally by me on the following activities: development of treatment plan with patient and/or surrogate as well as nursing, discussions with consultants, evaluation of patient's response to treatment, examination of patient, obtaining history from patient or surrogate, ordering and performing treatments and interventions, ordering and review of laboratory studies, ordering and review of radiographic studies, pulse oximetry and re-evaluation of patient's condition. Labs Review Labs Reviewed  BASIC METABOLIC PANEL - Abnormal; Notable for the following:    CO2 15 (*)    Glucose, Bld 320 (*)    BUN 41 (*)    Creatinine, Ser 4.13 (*)    Calcium 8.2 (*)    GFR calc non Af Amer 13 (*)    GFR calc Af Amer 15 (*)    Anion gap 19 (*)     All other components within normal limits  CBC - Abnormal; Notable for the following:    WBC 15.2 (*)    RBC 3.08 (*)    Hemoglobin 9.1 (*)    HCT 30.4 (*)    MCHC 29.9 (*)    Platelets 97 (*)    All other components within normal limits  URINALYSIS, ROUTINE W REFLEX MICROSCOPIC (NOT AT [facility]) - Abnormal; Notable for the following:    Color, Urine RED (*)    APPearance TURBID (*)    Hgb urine dipstick LARGE (*)    Bilirubin Urine SMALL (*)    Ketones, ur 15 (*)    Protein, ur 100 (*)    Nitrite POSITIVE (*)    Leukocytes, UA MODERATE (*)    All other components within normal limits  URINE MICROSCOPIC-ADD ON - Abnormal; Notable for the following:    Bacteria, UA MANY (*)    All other components within normal limits  I-STAT TROPOININ, ED - Abnormal; Notable for the following:    Troponin i, poc 0.11 (*)    All other components within normal limits  I-STAT CG4 LACTIC ACID, ED - Abnormal; Notable for the following:    Lactic Acid, Venous 11.85 (*)    All other components within normal limits  I-STAT CHEM 8, ED - Abnormal; Notable for the following:    BUN 50 (*)    Creatinine, Ser 3.60 (*)    Glucose, Bld 302 (*)    Calcium, Ion 1.04 (*)    Hemoglobin 10.2 (*)    HCT 30.0 (*)    All other components within normal limits  I-STAT ARTERIAL BLOOD GAS, ED - Abnormal; Notable for the  following:    pH, Arterial 7.202 (*)    pCO2 arterial 28.6 (*)    pO2, Arterial 201.0 (*)    Bicarbonate 11.2 (*)    Acid-base deficit 15.0 (*)    All other components within normal limits  CULTURE, BLOOD (ROUTINE X 2)  CULTURE, BLOOD (ROUTINE X 2)  LIPASE, BLOOD  PROTIME-INR  BLOOD GAS, ARTERIAL  HEPATIC FUNCTION PANEL  TYPE AND SCREEN  PREPARE FRESH FROZEN PLASMA  PREPARE RBC (CROSSMATCH)    Imaging Review Dg Chest Portable 1 View  2015-07-19  CLINICAL DATA:  CPR x2 today, status post intubation EXAM: PORTABLE CHEST - 1 VIEW COMPARISON:  05/19/2015 FINDINGS: Cardiac shadow is prominent.  Increased central vascular congestion is noted consistent with the given clinical history. An endotracheal tube is now seen in satisfactory position approximately 3.8 cm above the carina. A nasogastric catheter is noted coiled in the stomach. No focal confluent infiltrate is seen. No pneumothorax is noted. A fracture of the right third rib laterally is seen which may be related to the recent CPR. IMPRESSION: Status post intubation as described. Increased central vascular congestion likely related to the recent cardiac event. There are changes consistent with a right third rib fracture likely related to recent CPR. Electronically Signed   By: Alcide Clever M.D.   On: 07-19-2015 15:28   I have personally reviewed and evaluated these images and lab results as part of my medical decision-making.   EKG Interpretation   Date/Time:  Wednesday July 19, 2015 14:38:14 EST Ventricular Rate:  77 PR Interval:    QRS Duration: 149 QT Interval:  411 QTC Calculation: 465 R Axis:   -73 Text Interpretation:  Atrial fibrillation RBBB and LAFB Inferior infarct,  old Baseline wander in lead(s) V6 Confirmed by Donnald Garre, MD, Lebron Conners [id number])  on 19-Jul-2015 5:00:52 PM      MDM   Final diagnoses:  Cardiac arrest [facility])  Status post cholecystectomy    Patient presents several hours status post discharge from cholecystectomy. Had return of circulation in the field but had multiple episodes of loss of pulses in the emergency department. He did require multiple doses of epinephrine and chest compressions. Area was stable formal endotracheal intubation. He had 2 L of fluids in the field reportedly and had an additional liter of fluids in the emergency department. That time with persistent hypotension Levothroid was initiated. Is also given one unit of blood for suspected postoperative bleeding. Patient remained in critical condition. Dr. Margaree Mackintosh was consults it from surgery. He came to the emergency department to assess the  patient. Critical care was also consult.   Arby Barrette, MD 2015/07/19 [phone]

## 2015-06-30 NOTE — Progress Notes (Signed)
  Subjective: Patient was discharged this morning - no complaints of any shortness of breath, chest pain.  Felt tired because he didn't get any sleep last night.  Tolerated breakfast.  I talked to the son-in-law.  He drove the patient home, helped him up the stairs into the house, and into a seat at the kitchen table.  The patient had no complaints, but asked the son-in-law to get his home oxygen.  While he went to get the O2, he heard a thud and turned around.  The patient had fallen out of the chair to the floor and was not breathing.  CPR chest compressions were initiated immediately.  EMS was called.  Intubated by EDP after 30 minutes of CPR.  Multiple episodes of CPR.  Responded transiently to epi.  Placed on levophed gtt.    CCM and Cardiology have been consulted.  Objective: Vital signs in last 24 hours: Temp:  [94.6 F (34.8 C)-97.9 F (36.6 C)] 95.2 F (35.1 C) (12/07 1545) Pulse Rate:  [77-114] 77 (12/07 1545) Resp:  [15-30] 30 (12/07 1545) BP: (66-118)/(32-64) 102/47 mmHg (12/07 1545) SpO2:  [85 %-100 %] 96 % (12/07 1545)    Intake/Output from previous day:   Intake/Output this shift: Total I/O In: 1335 [I.V.:1335] Out: -   Intubated, sedated Unresponsive - no response to stimuli despite no sedation Abd - obese, soft; dressings intact with minimal drainage   Lab Results:   Recent Labs  Jul 28, 2015 0530 2015/07/28 1442 2015/07/28 1451  WBC 7.8 15.2*  --   HGB 10.3* 9.1* 10.2*  HCT 33.4* 30.4* 30.0*  PLT 118* 97*  --    BMET  Recent Labs  07/28/2015 0530 07-28-15 1442 07-28-2015 1451  NA 138 137 136  K 5.4* 5.0 5.0  CL 105 103 104  CO2 25 15*  --   GLUCOSE 146* 320* 302*  BUN 35* 41* 50*  CREATININE 2.88* 4.13* 3.60*  CALCIUM 8.4* 8.2*  --    PT/INR  Recent Labs  06/29/15 1005  LABPROT 14.2  INR 1.08   ABG No results for input(s): PHART, HCO3 in the last 72 hours.  Invalid input(s): PCO2, PO2  Studies/Results: Dg Chest Portable 1  View  07/28/15  CLINICAL DATA:  CPR x2 today, status post intubation EXAM: PORTABLE CHEST - 1 VIEW COMPARISON:  05/19/2015 FINDINGS: Cardiac shadow is prominent. Increased central vascular congestion is noted consistent with the given clinical history. An endotracheal tube is now seen in satisfactory position approximately 3.8 cm above the carina. A nasogastric catheter is noted coiled in the stomach. No focal confluent infiltrate is seen. No pneumothorax is noted. A fracture of the right third rib laterally is seen which may be related to the recent CPR. IMPRESSION: Status post intubation as described. Increased central vascular congestion likely related to the recent cardiac event. There are changes consistent with a right third rib fracture likely related to recent CPR. Electronically Signed   By: Alcide Clever M.D.   On: 2015-07-28 15:28    Anti-infectives: Anti-infectives    None      Assessment/Plan: S/p laparoscopic cholecystectomy on 06/29/15 Cardiopulmonary arrest - unclear etiology Blood was started by the EDP - agree with normalizing Hgb  Will follow along - appreciate CCM/ Cardiology No acute surgical indications     Ladislav Caselli K. 07/28/2015

## 2015-06-30 NOTE — ED Notes (Signed)
Hold Second unit of RBCs per Dr. Clarice Pole.

## 2015-06-30 NOTE — Discharge Summary (Signed)
Physician Discharge Summary  Patient ID: [name] MRN: [id number] DOB/AGE: [birth year] 81 y.o.  Admit date: 06/29/2015 Discharge date: 07-20-15  Admission Diagnoses:  Chronic calculus cholecystitis  Discharge Diagnoses: same Active Problems:   Chronic cholecystitis with calculus   Discharged Condition: good  Hospital Course: Laparoscopic cholecystectomy on 06/29/15.  We kept him overnight for observation.  No significant events overnight.  Minimal soreness.  Ready for discharge.  His chronic renal insufficiency seems to have bumped with a creatinine to 2.8.  He is making urine.    Consults: None  Significant Diagnostic Studies: labs:  Lab Results  Component Value Date   CREATININE 2.88* July 20, 2015   BUN 35* July 20, 2015   NA 138 07-20-2015   K 5.4* 07/20/15   CL 105 20-Jul-2015   CO2 25 2015-07-20   Lab Results  Component Value Date   WBC 7.8 07/20/2015   HGB 10.3* 07-20-15   HCT 33.4* July 20, 2015   MCV 95.2 07-20-2015   PLT 118* 2015-07-20     Treatments: surgery: lap chole  Discharge Exam: Blood pressure 118/45, pulse 107, temperature 97.8 F (36.6 C), temperature source Oral, resp. rate 20, height 5\' 10"  (1.778 m), weight 108.41 kg (239 lb), SpO2 93 %. see today's note  Disposition: 01-Home or Self Care  Discharge Instructions    Call MD for:  persistant nausea and vomiting    Complete by:  As directed      Call MD for:  redness, tenderness, or signs of infection (pain, swelling, redness, odor or green/yellow discharge around incision site)    Complete by:  As directed      Call MD for:  severe uncontrolled pain    Complete by:  As directed      Call MD for:  temperature >100.4    Complete by:  As directed      Diet general    Complete by:  As directed      Driving Restrictions    Complete by:  As directed   Do not drive while taking pain medications     Increase activity slowly    Complete by:  As directed      May shower / Bathe    Complete  by:  As directed      May walk up steps    Complete by:  As directed             Medication List    TAKE these medications        acetaminophen 325 MG tablet  Commonly known as:  TYLENOL  Take 650 mg by mouth every 6 (six) hours as needed for mild pain or moderate pain.     allopurinol 300 MG tablet  Commonly known as:  ZYLOPRIM  Take 1 tablet (300 mg total) by mouth every morning.     amLODipine 5 MG tablet  Commonly known as:  NORVASC  Take 5 mg by mouth daily.     aspirin EC 81 MG tablet  Take 81 mg by mouth daily.     buPROPion 150 MG 24 hr tablet  Commonly known as:  WELLBUTRIN XL  TAKE ONE TABLET BY MOUTH EVERY MORNING     cholecalciferol 1000 UNITS tablet  Commonly known as:  VITAMIN D  Take 1,000 Units by mouth daily.     clopidogrel 75 MG tablet  Commonly known as:  PLAVIX  Take 1 tablet (75 mg total) by mouth daily.     DSS 100 MG Caps  Take  100 mg by mouth 2 (two) times daily.     Ferrous Gluconate 256 (28 FE) MG Tabs  Take 256 mg by mouth daily.     furosemide 40 MG tablet  Commonly known as:  LASIX  Take 1 tablet (40 mg total) by mouth 2 (two) times daily.     LORazepam 1 MG tablet  Commonly known as:  ATIVAN  Take 1 tablet (1 mg total) by mouth 2 (two) times daily as needed for anxiety.     Magnesium 250 MG Tabs  Take 250 mg by mouth daily.     metoprolol succinate 25 MG 24 hr tablet  Commonly known as:  TOPROL-XL  Take 1 tablet (25 mg total) by mouth daily. Take with or immediately following a meal.     mirabegron ER 50 MG Tb24 tablet  Commonly known as:  MYRBETRIQ  Take 1 tablet (50 mg total) by mouth daily.     mirtazapine 15 MG tablet  Commonly known as:  REMERON  Take 1 tablet (15 mg total) by mouth at bedtime.     nitroGLYCERIN 0.4 MG SL tablet  Commonly known as:  NITROSTAT  Place 0.4 mg under the tongue every 5 (five) minutes as needed for chest pain (3 doses MAX).     oxyCODONE-acetaminophen 10-325 MG tablet  Commonly  known as:  PERCOCET  Take 1 tablet by mouth daily as needed for pain.     oxyCODONE-acetaminophen 5-325 MG tablet  Commonly known as:  PERCOCET/ROXICET  Take 1-2 tablets by mouth every 4 (four) hours as needed for moderate pain.     pantoprazole 40 MG tablet  Commonly known as:  PROTONIX  Take 1 tablet (40 mg total) by mouth 2 (two) times daily.     pregabalin 200 MG capsule  Commonly known as:  LYRICA  Take 1 capsule (200 mg total) by mouth at bedtime.     rOPINIRole 4 MG tablet  Commonly known as:  REQUIP  Take 1 tablet (4 mg total) by mouth at bedtime.     tamsulosin 0.4 MG Caps capsule  Commonly known as:  FLOMAX  Take 1 capsule (0.4 mg total) by mouth daily.     vitamin B-12 1000 MCG tablet  Commonly known as:  CYANOCOBALAMIN  Take 1,000 mcg by mouth daily.           Follow-up Information    Follow up with Gottfried Standish K., MD. Schedule an appointment as soon as possible for a visit in 3 weeks.   Specialty:  General Surgery   Contact information:   [address] [place] [address] [phone]      I have sent Dr. Oliver Barre a message asking that his office recheck the renal function tomorrow.   The patient has also been told that he needs to have repeat labs.  Signed: Jaylee Freeze K. 07-26-2015, 8:01 AM

## 2015-07-01 ENCOUNTER — Inpatient Hospital Stay (HOSPITAL_COMMUNITY): Payer: Medicare Other

## 2015-07-01 DIAGNOSIS — G931 Anoxic brain damage, not elsewhere classified: Secondary | ICD-10-CM | POA: Insufficient documentation

## 2015-07-01 DIAGNOSIS — I469 Cardiac arrest, cause unspecified: Secondary | ICD-10-CM

## 2015-07-01 DIAGNOSIS — G9340 Encephalopathy, unspecified: Secondary | ICD-10-CM | POA: Insufficient documentation

## 2015-07-01 DIAGNOSIS — I350 Nonrheumatic aortic (valve) stenosis: Secondary | ICD-10-CM

## 2015-07-01 LAB — GLUCOSE, CAPILLARY
GLUCOSE-CAPILLARY: 139 mg/dL — AB (ref 65–99)
GLUCOSE-CAPILLARY: 140 mg/dL — AB (ref 65–99)
GLUCOSE-CAPILLARY: 179 mg/dL — AB (ref 65–99)
Glucose-Capillary: 179 mg/dL — ABNORMAL HIGH (ref 65–99)
Glucose-Capillary: 165 mg/dL — ABNORMAL HIGH (ref 65–99)

## 2015-07-01 LAB — TYPE AND SCREEN
ABO/RH(D): B POS
ANTIBODY SCREEN: NEGATIVE
Unit division: 0
Unit division: 0

## 2015-07-01 LAB — BASIC METABOLIC PANEL
ANION GAP: 8 (ref 5–15)
ANION GAP: 10 (ref 5–15)
BUN: 51 mg/dL — ABNORMAL HIGH (ref 6–20)
BUN: 53 mg/dL — AB (ref 6–20)
CALCIUM: 7.7 mg/dL — AB (ref 8.9–10.3)
CHLORIDE: 105 mmol/L (ref 101–111)
CO2: 20 mmol/L — AB (ref 22–32)
CO2: 17 mmol/L — AB (ref 22–32)
CREATININE: 4.73 mg/dL — AB (ref 0.61–1.24)
Calcium: 7.7 mg/dL — ABNORMAL LOW (ref 8.9–10.3)
Chloride: 105 mmol/L (ref 101–111)
Creatinine, Ser: 4.83 mg/dL — ABNORMAL HIGH (ref 0.61–1.24)
GFR calc Af Amer: 12 mL/min — ABNORMAL LOW (ref >60)
GFR calc non Af Amer: 10 mL/min — ABNORMAL LOW (ref >60)
GFR, EST AFRICAN AMERICAN: 12 mL/min — AB (ref >60)
GFR, EST NON AFRICAN AMERICAN: 11 mL/min — AB (ref >60)
GLUCOSE: 189 mg/dL — AB (ref 65–99)
Glucose, Bld: 189 mg/dL — ABNORMAL HIGH (ref 65–99)
POTASSIUM: 6.7 mmol/L — AB (ref 3.5–5.1)
Potassium: 6.8 mmol/L (ref 3.5–5.1)
SODIUM: 133 mmol/L — AB (ref 135–145)
Sodium: 132 mmol/L — ABNORMAL LOW (ref 135–145)

## 2015-07-01 LAB — TROPONIN I
TROPONIN I: 4.35 ng/mL — AB (ref <0.031)
TROPONIN I: 5.68 ng/mL — AB (ref <0.031)

## 2015-07-01 LAB — CBC
HEMATOCRIT: 32.2 % — AB (ref 39.0–52.0)
Hemoglobin: 10.2 g/dL — ABNORMAL LOW (ref 13.0–17.0)
MCH: 29.2 pg (ref 26.0–34.0)
MCHC: 31.7 g/dL (ref 30.0–36.0)
MCV: 92.3 fL (ref 78.0–100.0)
Platelets: 102 10*3/uL — ABNORMAL LOW (ref 150–400)
RBC: 3.49 MIL/uL — ABNORMAL LOW (ref 4.22–5.81)
RDW: 14.9 % (ref 11.5–15.5)
WBC: 12.5 10*3/uL — AB (ref 4.0–10.5)

## 2015-07-01 LAB — PHOSPHORUS: PHOSPHORUS: 4 mg/dL (ref 2.5–4.6)

## 2015-07-01 LAB — PREPARE RBC (CROSSMATCH)

## 2015-07-01 LAB — HEMOGLOBIN A1C
HEMOGLOBIN A1C: 6.2 % — AB (ref 4.8–5.6)
MEAN PLASMA GLUCOSE: 131 mg/dL

## 2015-07-01 LAB — MAGNESIUM: MAGNESIUM: 2 mg/dL (ref 1.7–2.4)

## 2015-07-01 MED ORDER — SODIUM BICARBONATE 8.4 % IV SOLN
50.0000 meq | Freq: Once | INTRAVENOUS | Status: AC
  Administered 2015-07-01: 50 meq via INTRAVENOUS
  Filled 2015-07-01: qty 50

## 2015-07-01 MED ORDER — MORPHINE BOLUS VIA INFUSION
5.0000 mg | INTRAVENOUS | Status: DC | PRN
  Filled 2015-07-01: qty 20

## 2015-07-01 MED ORDER — NOREPINEPHRINE BITARTRATE 1 MG/ML IV SOLN
2.0000 ug/min | INTRAVENOUS | Status: DC
  Administered 2015-07-01: 20 ug/min via INTRAVENOUS
  Filled 2015-07-01: qty 16

## 2015-07-01 MED ORDER — SODIUM POLYSTYRENE SULFONATE 15 GM/60ML PO SUSP
15.0000 g | Freq: Once | ORAL | Status: AC
  Administered 2015-07-01: 15 g
  Filled 2015-07-01: qty 60

## 2015-07-01 MED ORDER — MORPHINE SULFATE 25 MG/ML IV SOLN
10.0000 mg/h | INTRAVENOUS | Status: DC
  Administered 2015-07-01: 10 mg/h via INTRAVENOUS
  Filled 2015-07-01: qty 10

## 2015-07-02 ENCOUNTER — Telehealth: Payer: Self-pay

## 2015-07-02 NOTE — Telephone Encounter (Signed)
On 07/02/2015 I received a death certificate from [facility]. The death certificate is for cremation. The patient is a patient of Doctor Maneem. The death certificate will be taken to [facility] this pm for signature. On Aug 01, 2015 I received the death certificate back from Doctor Maneem. I got the death certificate ready and called the funeral home to let them know the death certificate is ready for pickup.

## 2015-07-03 MED FILL — Medication: Qty: 1 | Status: AC

## 2015-07-05 LAB — CULTURE, BLOOD (ROUTINE X 2): CULTURE: NO GROWTH

## 2015-07-09 ENCOUNTER — Ambulatory Visit: Payer: Medicare Other | Admitting: Pulmonary Disease

## 2015-07-25 NOTE — Progress Notes (Signed)
Subjective:  Event of night reviewed.  He is on pressors,  Now with renal failure and hyperkalemia.  Unresponsive to voice or touch.  Pupils unequal.    Objective:  Vital Signs in the last 24 hours: BP 123/56 mmHg  Pulse 50  Temp(Src) 98.1 F (36.7 C) (Oral)  Resp 17  Ht 5\' 11"  (1.803 m)  Wt 117.2 kg (258 lb 6.1 oz)  BMI 36.05 kg/m2  SpO2 100%  Physical Exam: Elderly WM unresponsive on vent Lungs:  Clear Cardiac:  Regular rhythm, normal S1 and S2, no S3, 2/6 sys murmur Abdomen:  Soft, nontender, no masses Extremities:  No edema present  Intake/Output from previous day: 12/07 0701 - 12/08 0700 In: 3303.5 [I.V.:3303.5] Out: 305 [Urine:5; Emesis/NG output:300]  Weight Filed Weights   07/28/15 1830 07/21/2015 0500  Weight: 116.2 kg (256 lb 2.8 oz) 117.2 kg (258 lb 6.1 oz)    Lab Results: Basic Metabolic Panel:  Recent Labs  34/28/76 0410 07/16/2015 0615  NA 133* 132*  K 6.8* 6.7*  CL 105 105  CO2 20* 17*  GLUCOSE 189* 189*  BUN 51* 53*  CREATININE 4.73* 4.83*   CBC:  Recent Labs  2015-07-28 1442 Jul 28, 2015 1451 07/20/2015 0410  WBC 15.2*  --  12.5*  HGB 9.1* 10.2* 10.2*  HCT 30.4* 30.0* 32.2*  MCV 98.7  --  92.3  PLT 97*  --  102*   Cardiac Enzymes: Troponin (Point of Care Test)  Recent Labs  July 28, 2015 1449  TROPIPOC 0.11*   Cardiac Panel (last 3 results)  Recent Labs  07-28-2015 1922 06/29/2015 0015 07/10/2015 0410  TROPONINI 1.50* 4.35* 5.68*    Telemetry: Looks like junctional rhythm at 50.   Assessment/Plan:  1. Post cardiac arrest ? Reason .  Appears to have severe brain damage 2. Junctional rhythm 3. Acute renal failure with hyperkalemia.   Rec:  Talked with family about withdrawal of support if no neurologic function.  Currently not sedated. They would be agreeable.  Woul like to look at recent TAVR with ECHO.     Darden Palmer  MD [facility] Card[facility]  07/04/2015, 9:23 AM

## 2015-07-25 NOTE — Progress Notes (Signed)
Patient ID: [name], male   DOB: 18-Jun-1933, 82 y.o.   MRN: [id number]  Have noted the events and test results.  No sign of brain activity.  Family is planning terminal extubation.    I appreciate CCM/ Cardiology/ Neurology assistance in this unfortunate case.  [name], MD, [facility]  General/ Trauma Surgery  07-25-2015 2:39 PM

## 2015-07-25 NOTE — Progress Notes (Signed)
I met with extended [name] family today and reviewed the EEG results which showed no cerebral cortical activity. He does not have any brainstem reflexes and he is likely brain dead. They want to terminally extubate him and make him comfort care. I discussed autopsy with them but they're not interested in it.

## 2015-07-25 NOTE — Progress Notes (Signed)
CRITICAL VALUE ALERT  Critical value received: potassium  Date of notification: 12/0/2016  Time of notification:  0525  Critical value read back:yes  Nurse who received alert:  Shary Decamp  MD notified (1st page): [phone]  Time of first page: 0525  MD notified (2nd page):  Time of second page:  Responding MD:  Jamison Neighbor  Time MD responded:  [phone]

## 2015-07-25 NOTE — Progress Notes (Signed)
Utilization review completed. Makennah Omura, RN, BSN. 

## 2015-07-25 NOTE — Progress Notes (Signed)
EEG completed; results pending.    

## 2015-07-25 NOTE — Progress Notes (Signed)
VASCULAR LAB PRELIMINARY  PRELIMINARY  PRELIMINARY  PRELIMINARY  Bilateral lower extremity venous duplex completed.    Preliminary report:  There is no DVT or SVT noted in the bilateral lower extremities.   Cheston Coury, RVT July 22, 2015, 12:32 PM

## 2015-07-25 NOTE — Progress Notes (Signed)
Prayed with the pt.'s family and offered further support if needed.

## 2015-07-25 NOTE — Clinical Documentation Improvement (Signed)
Critical Care  Can the diagnosis of anemia be further specified? Thank you   Iron deficiency Anemia  Nutritional anemia, including the nutrition or mineral deficits  Chronic Anemia, including the suspected or known cause  Anemia of chronic disease, including the associated chronic disease state  Other  Clinically Undetermined  Document any associated diagnoses/conditions.   Supporting Information:  Hemoglobin 13.0 - 17.0 g/dL 85.8 (L) 85.0 (L) 9.1 (L) 10.3 (L)    HCT 39.0 - 52.0 % 32.2 (L) 30.0 (L) 30.4 (L) 33.4 (L)        Transfused w PRBC   Please exercise your independent, professional judgment when responding. A specific answer is not anticipated or expected.   Thank You,  Lavonda Jumbo Health Information Management [place] [phone]

## 2015-07-25 NOTE — Procedures (Signed)
Extubation Procedure Note  Patient Details:   Name: [name] DOB: Feb 16, 1933 MRN: [id number]   Airway Documentation:  Airway 7.5 mm (Active)  Secured at (cm) 25 cm 07/15/2015 12:13 PM  Measured From Lips 07/04/2015 12:13 PM  Secured Location Center 07/19/2015 12:13 PM  Secured By [employer] 06/24/2015 12:13 PM  Tube Holder Repositioned Yes 06/26/2015 12:13 PM  Cuff Pressure (cm H2O) 26 cm H2O 21-Jul-2015  7:30 PM  Site Condition Dry 07/04/2015 12:13 PM    Evaluation  O2 sats: currently acceptable Complications: No apparent complications Patient tolerate procedure well. Bilateral Breath Sounds: Clear Suctioning: Airway No Pt. Terminally extubated per family request.  [place] R [place] Viereck 07/15/2015, 3:12 PM

## 2015-07-25 NOTE — Progress Notes (Signed)
eLink Physician-Brief Progress Note Patient Name: [name] DOB: [birth year] MRN: [id number]   Date of Service  07/14/2015  HPI/Events of Note  RN notified of Hyperkalemia (6.8). No visible hemolysis on report. Lactic & Metabolic acidosis improving.  eICU Interventions  Repeat BMP Stat.     Intervention Category Major Interventions: Electrolyte abnormality - evaluation and management  Lawanda Cousins 06/28/2015, 5:29 AM

## 2015-07-25 NOTE — Progress Notes (Signed)
  Echocardiogram 2D Echocardiogram has been performed.  Leta Jungling M 06/30/2015, 11:21 AM

## 2015-07-25 NOTE — Discharge Summary (Signed)
Name:[name] TYV:[id number] DOB:March 24, 1933   ADMISSION DATE: 07/03/15 DATE OF DEATH: Jul 04, 2015  [name] is a 82 year old with extended past medical history of obesity, restless leg syndrome, hypertension, coronary artery disease, TIA, CKD, aortic stenosis S/P TAVR (transcatheter aortic valve replacement) (04/06/2015), CHF, OSA on CPAP, diabetes mellitus. He underwent an elective lap cholecystectomy in 12/6. He was discharged home on 03-Jul-2023. On arriving home he collapsed and went into cardiac arrest. He had a prolonged downtime with approximately 60 minutes to ROSC. He was not a hyperthermia candidate due to the prolonged downtime. He was intubated and required Levophed for BP support.    He had severe anoxic brain injury. I met with extended [name] family and reviewed the EEG results which showed no cerebral cortical activity. He does not have any brainstem reflexes and he is likely brain dead. They wanted to terminally extubate him and make him comfort care. I discussed autopsy with them but they were not interested in it.   He was extubated on 07-04-2015 and passed away shortly thereafter.  Marshell Garfinkel MD  Pulmonary and Critical Care Pager [phone] If no answer or after 3pm call: [phone] 07/15/2015, 3:32 PM

## 2015-07-25 NOTE — Procedures (Signed)
ELECTROENCEPHALOGRAM REPORT  Patient: [name]       Room #: 4M35 EEG No. ID: [id number] Age: 82 y.o.        Sex: male Referring Physician: Lavinia Sharps Report Date:  2015-07-26        Interpreting Physician: Aline Brochure  History: [name] is an 82 y.o. male status post cardiac arrest on 07/18/2015. Patient has remained intubated and on mechanical ventilation as well as unresponsive.  Indications for study:  Assess severity of encephalopathy; rule out subclinical seizure activity.  Technique: This is an 18 channel routine scalp EEG performed at the bedside with bipolar montage arranged in accordance to the international 10/20 system of electrode recording initially, but subsequently switched to ECS (electrocerebral silence) recording standards.  Description: Patient was intubated and on mechanical ventilation at the time of this EEG recording. He was unresponsive to external stimuli, including noxious stimuli, as well as verbal stimuli. No cerebral cortical electrical activity was detected at any point during this recording. Sources of all occurrences of artifact were identified.  Interpretation: This EEG is abnormal with findings consistent with electrocerebral silence. No evidence of intact remaining cerebral cortical electrical activity was detected.   Venetia Maxon M.D. [facility] [phone]

## 2015-07-25 NOTE — Progress Notes (Signed)
2 RN verified no heart beat or respirations.

## 2015-07-25 NOTE — Progress Notes (Signed)
eLink Physician-Brief Progress Note Patient Name: [name] DOB: May 12, 1933 MRN: [id number]   Date of Service  07/11/2015  HPI/Events of Note  RN notified of Hyperkalemia (6.7) on repeat BMP. Bicarb 17.  eICU Interventions  1. Kayexalate 15mg  via tube x1 2. Bicarb 1amp IV 3. BMP @ noon 4. Already received [place] insulin to treat Glucose 179 5. EKG now 6. Monitor in telemetry     Intervention Category Major Interventions: Electrolyte abnormality - evaluation and management  Lawanda Cousins 07/04/2015, 7:02 AM

## 2015-07-25 DEATH — deceased

## 2015-08-24 ENCOUNTER — Ambulatory Visit: Payer: Medicare Other | Admitting: Family

## 2015-08-24 ENCOUNTER — Encounter (HOSPITAL_COMMUNITY): Payer: Medicare Other

## 2015-09-09 ENCOUNTER — Ambulatory Visit: Payer: Medicare Other | Admitting: Pulmonary Disease

## 2015-11-25 ENCOUNTER — Ambulatory Visit: Payer: Self-pay | Admitting: Internal Medicine

## 2016-06-27 IMAGING — CR DG CHEST 2V
2 series · 2 of 2 positions shown · non-contrast
Comparison: 03/20/2014

CLINICAL DATA: Short of breath.  Cough and congestion.

EXAM:
CHEST  2 VIEW

[view not recorded (1 of 2)]
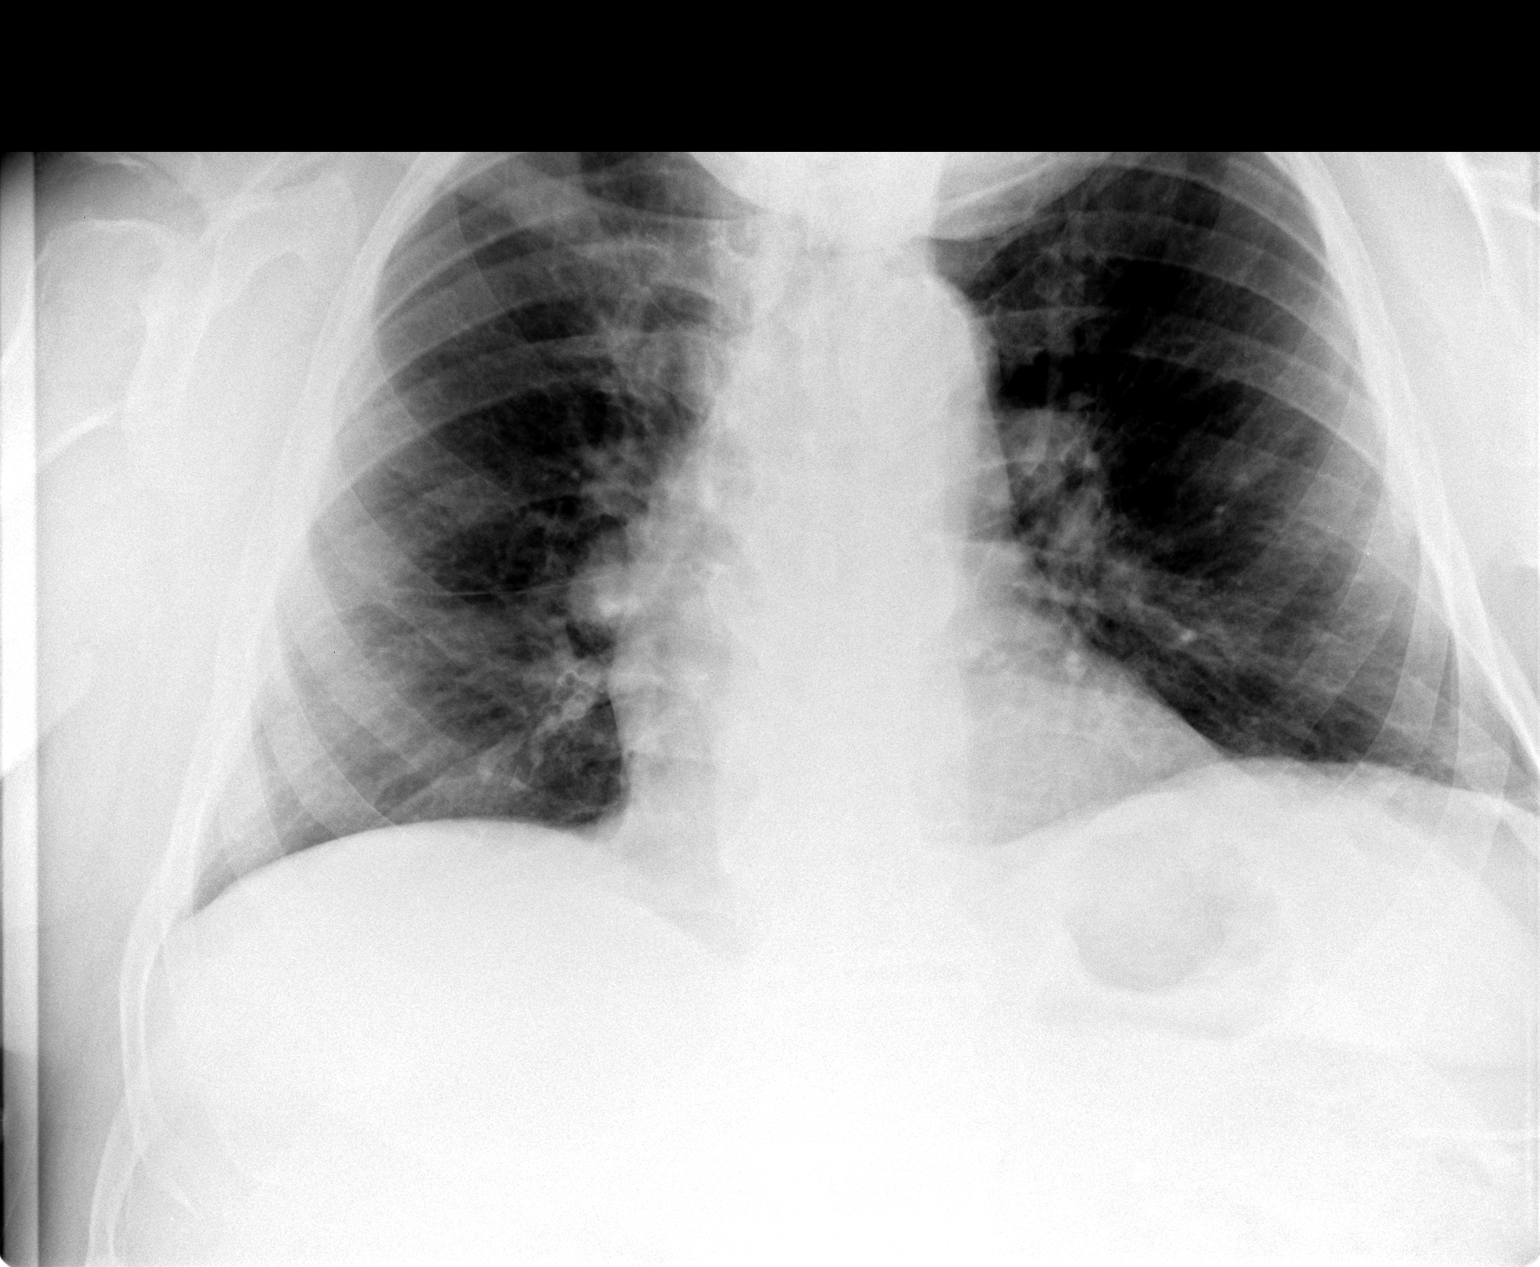

[view not recorded (2 of 2)]
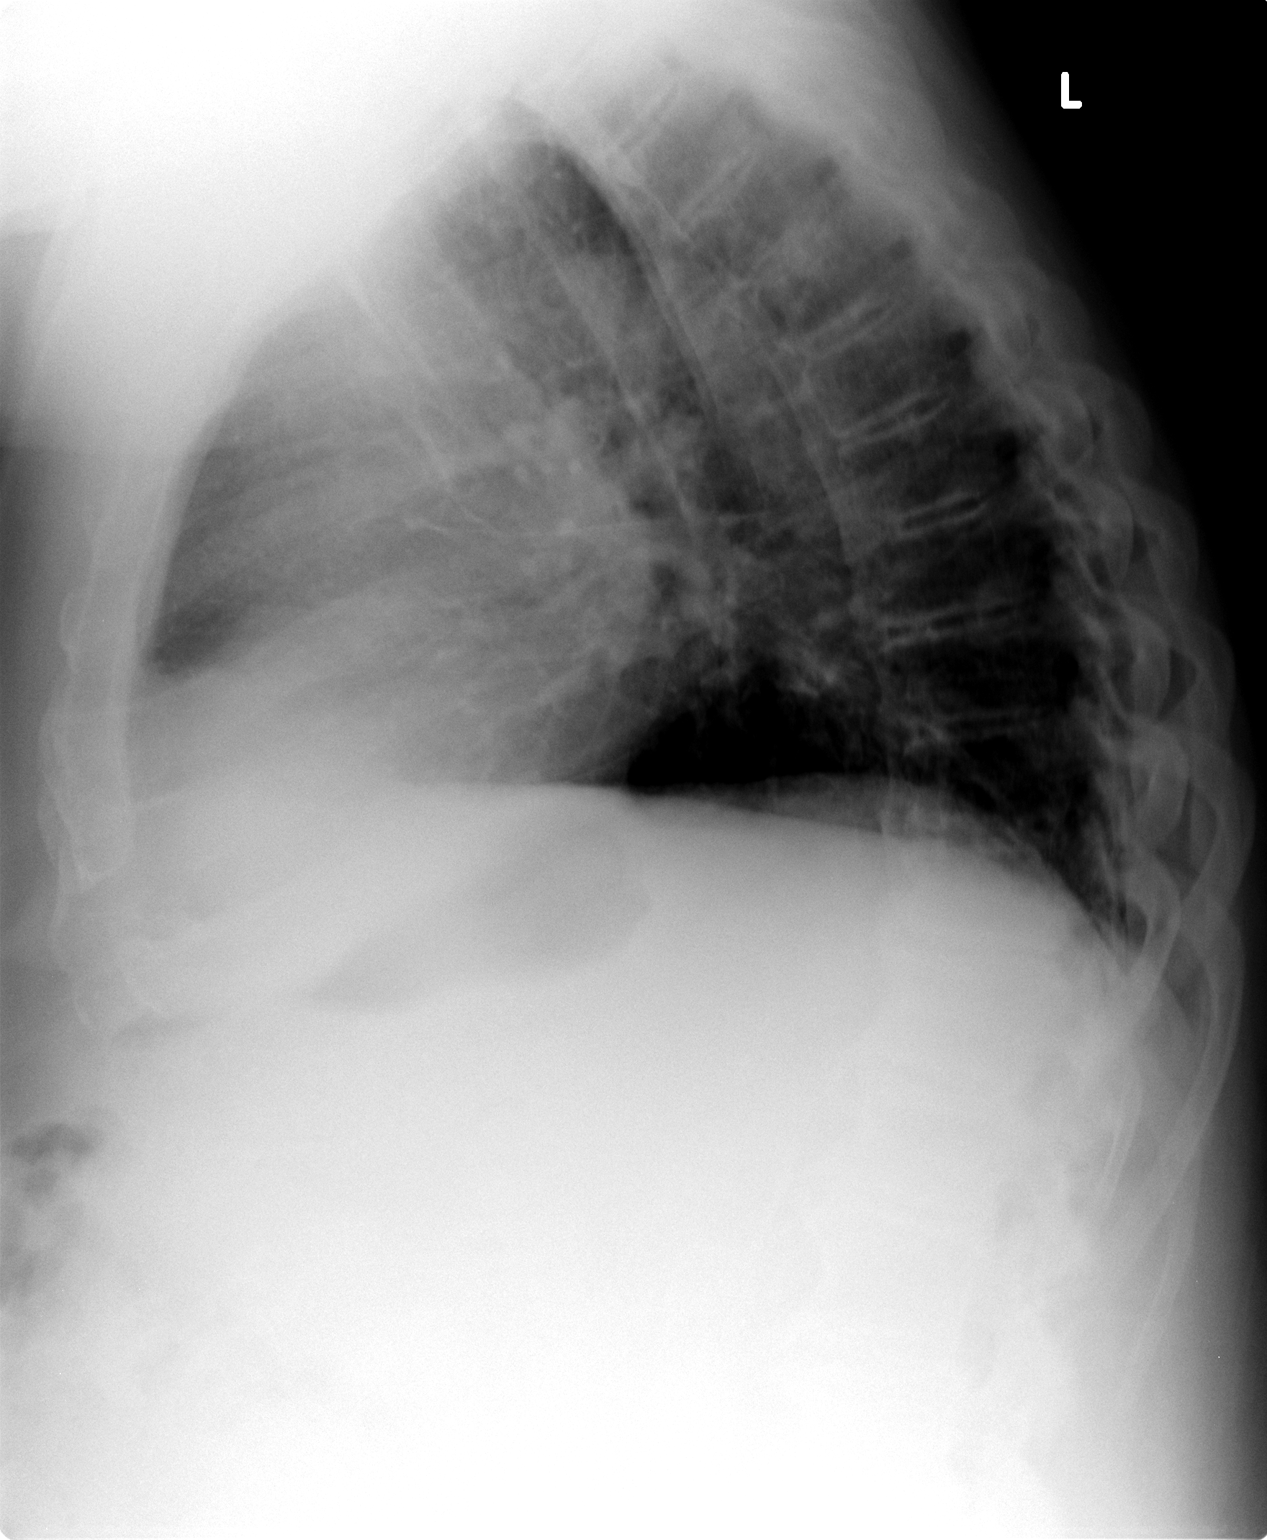

[2 of 2 positions shown; findings below may reference images not displayed]

FINDINGS: Cardiac silhouette is normal in size. No mediastinal or hilar masses
or evidence of adenopathy.

Lungs are clear.  No pleural effusion or pneumothorax.

Bony thorax is demineralized but intact.
IMPRESSION: No active cardiopulmonary disease.

## 2016-09-18 IMAGING — CR DG ABDOMEN 2V
3 series · 3 of 3 positions shown · non-contrast
Comparison: Radiographs 07/10/2014. Chest radiographs done today
are correlated.

CLINICAL DATA: Shortness of breath with abdominal pain for 3 days.

EXAM:
ABDOMEN - 2 VIEW

[abdomen erect]
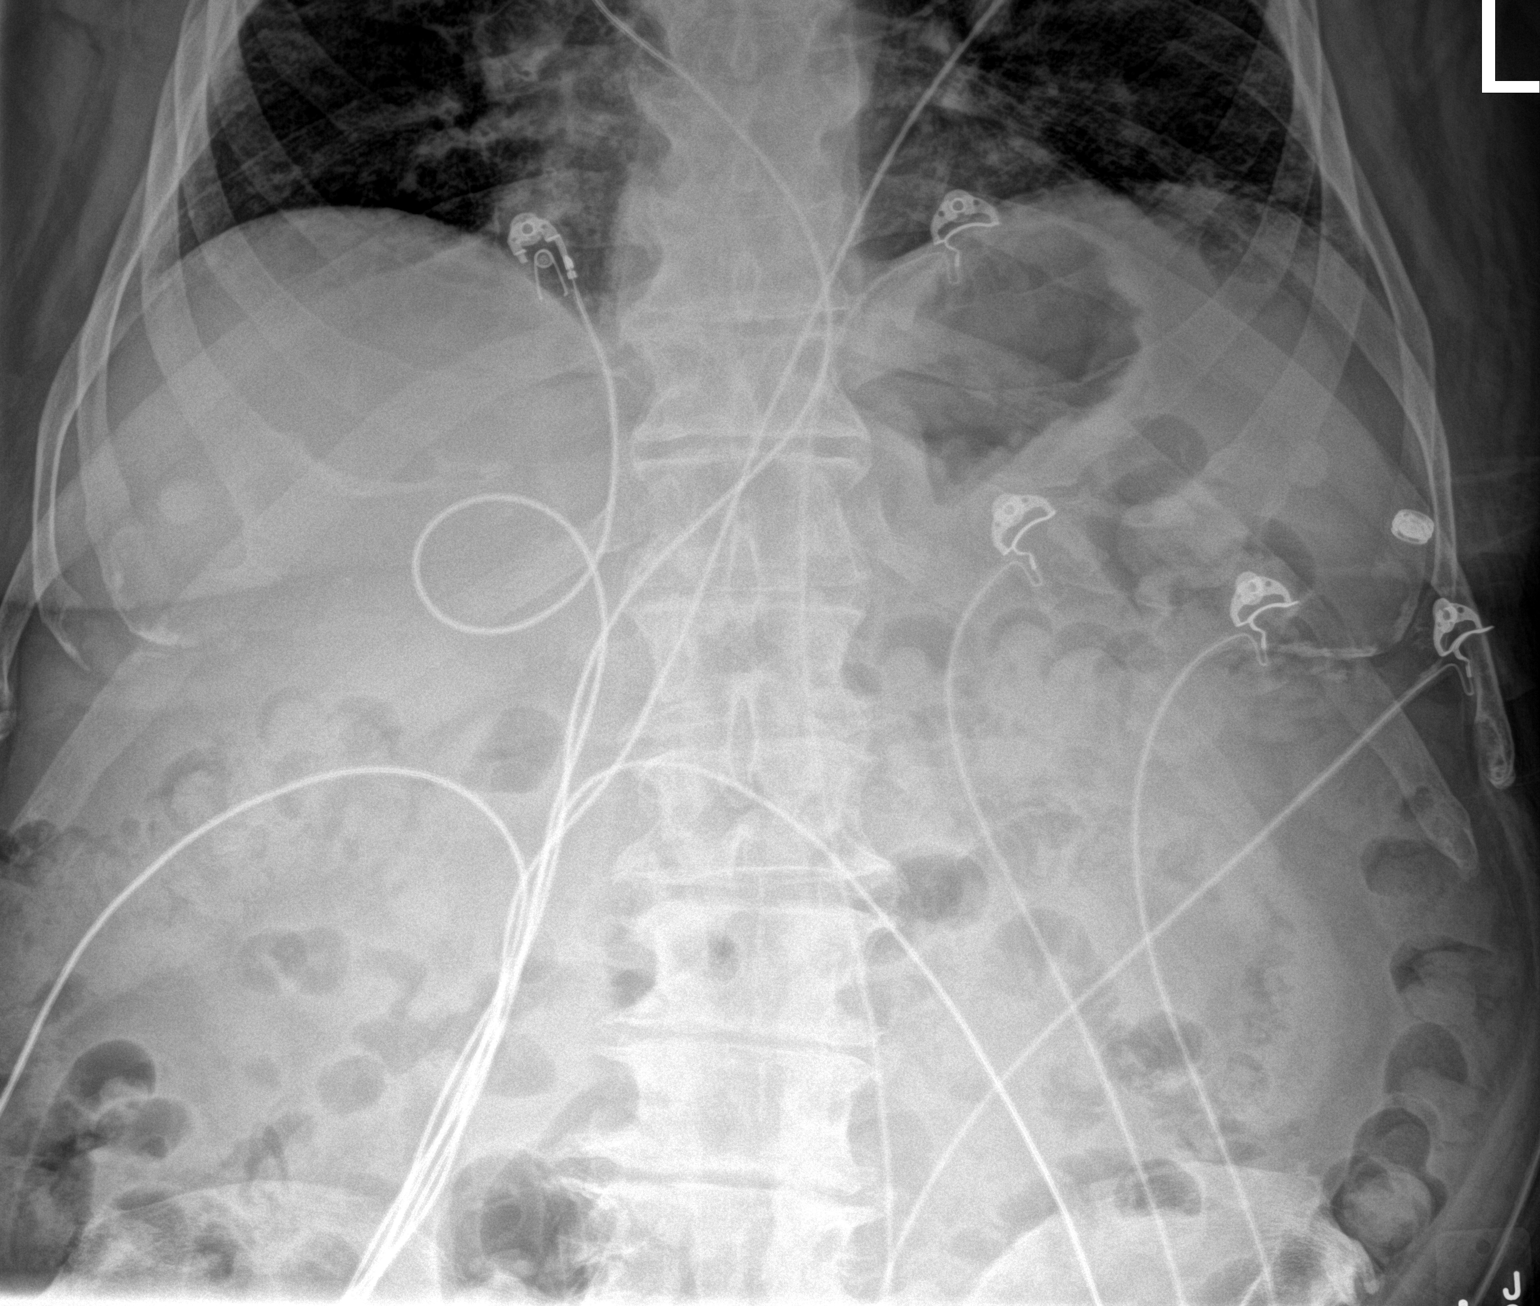

[abdomen supine (1 of 2)]
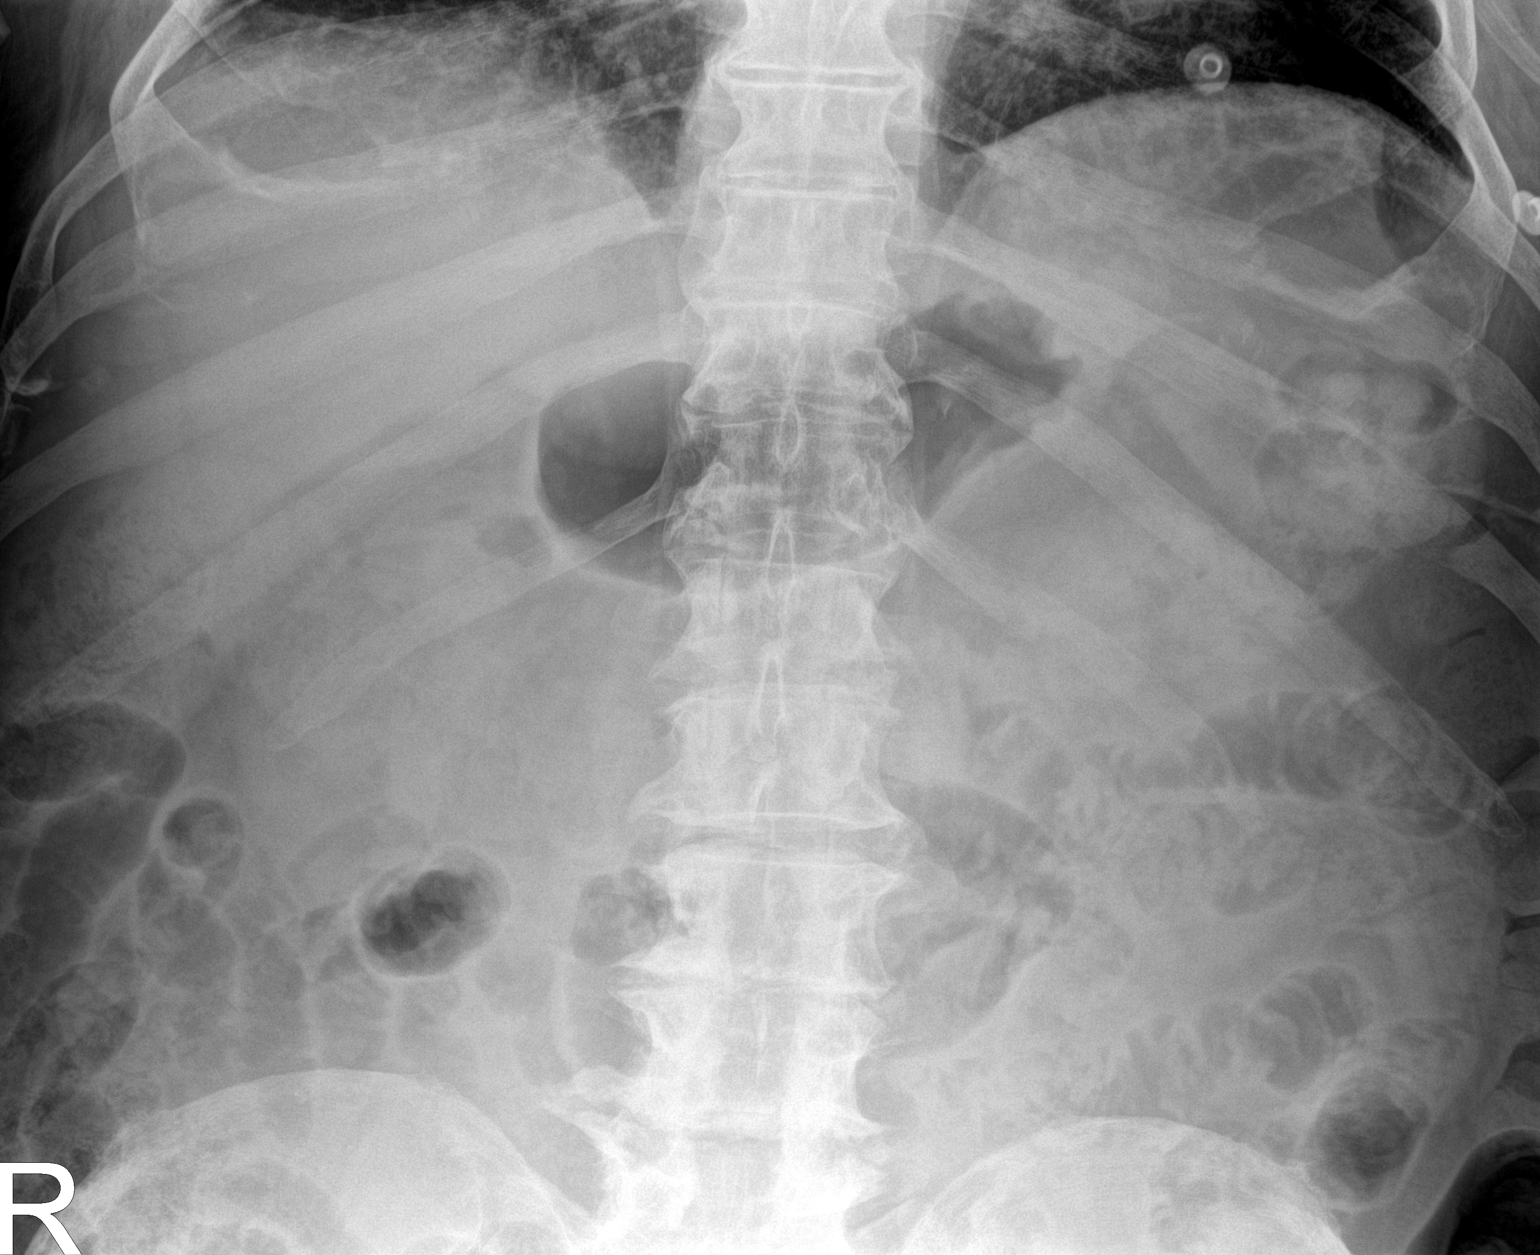

[abdomen supine (2 of 2)]
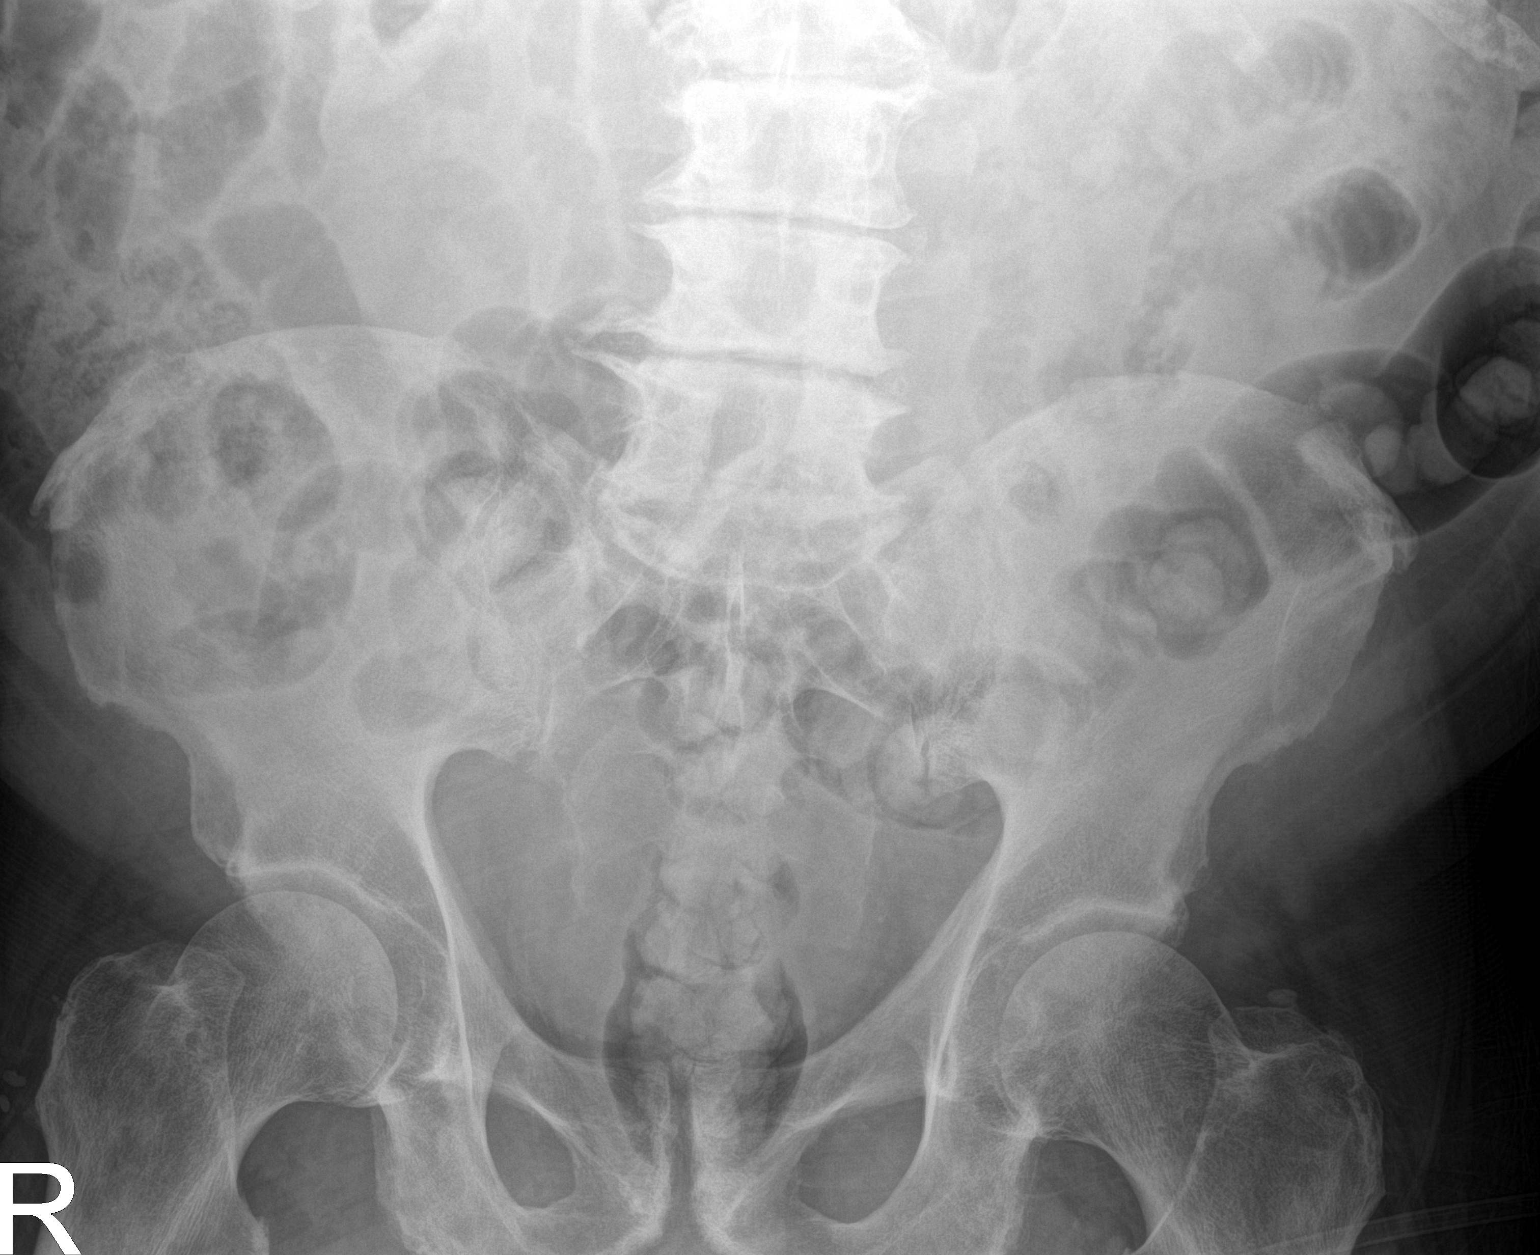

[3 of 3 positions shown; findings below may reference images not displayed]

FINDINGS: Somewhat nodular reticular densities at both lung bases are similar
to recent studies and likely exacerbated by lower lung volumes on
this study. The bowel gas pattern is normal. There is no free
intraperitoneal air or bowel wall thickening. There are no
suspicious abdominal calcifications. There are degenerative and
postsurgical changes throughout the lumbar spine.
IMPRESSION: No acute abdominal findings.  The bowel gas pattern remains normal.

## 2017-09-15 ENCOUNTER — Encounter (HOSPITAL_COMMUNITY): Payer: Self-pay | Admitting: *Deleted
